# Patient Record
Sex: Female | Born: 1950
Health system: Southern US, Community
[De-identification: ages and names within clinical notes are randomized; demographics above are authoritative.]

## PROBLEM LIST (undated history)

## (undated) DIAGNOSIS — M48061 Spinal stenosis, lumbar region without neurogenic claudication: Secondary | ICD-10-CM

## (undated) DIAGNOSIS — T7840XA Allergy, unspecified, initial encounter: Secondary | ICD-10-CM

## (undated) DIAGNOSIS — E559 Vitamin D deficiency, unspecified: Secondary | ICD-10-CM

## (undated) DIAGNOSIS — M5430 Sciatica, unspecified side: Secondary | ICD-10-CM

## (undated) DIAGNOSIS — M6281 Muscle weakness (generalized): Secondary | ICD-10-CM

## (undated) DIAGNOSIS — M810 Age-related osteoporosis without current pathological fracture: Secondary | ICD-10-CM

## (undated) DIAGNOSIS — IMO0001 Reserved for inherently not codable concepts without codable children: Secondary | ICD-10-CM

## (undated) DIAGNOSIS — M1990 Unspecified osteoarthritis, unspecified site: Secondary | ICD-10-CM

## (undated) DIAGNOSIS — F329 Major depressive disorder, single episode, unspecified: Secondary | ICD-10-CM

## (undated) DIAGNOSIS — R51 Headache: Secondary | ICD-10-CM

## (undated) DIAGNOSIS — M7071 Other bursitis of hip, right hip: Secondary | ICD-10-CM

## (undated) DIAGNOSIS — G8929 Other chronic pain: Secondary | ICD-10-CM

## (undated) DIAGNOSIS — M76899 Other specified enthesopathies of unspecified lower limb, excluding foot: Secondary | ICD-10-CM

## (undated) DIAGNOSIS — M797 Fibromyalgia: Secondary | ICD-10-CM

## (undated) DIAGNOSIS — F3289 Other specified depressive episodes: Secondary | ICD-10-CM

## (undated) DIAGNOSIS — R7989 Other specified abnormal findings of blood chemistry: Secondary | ICD-10-CM

## (undated) DIAGNOSIS — Z9289 Personal history of other medical treatment: Secondary | ICD-10-CM

## (undated) DIAGNOSIS — N179 Acute kidney failure, unspecified: Secondary | ICD-10-CM

## (undated) DIAGNOSIS — Z9109 Other allergy status, other than to drugs and biological substances: Secondary | ICD-10-CM

## (undated) DIAGNOSIS — I10 Essential (primary) hypertension: Secondary | ICD-10-CM

## (undated) DIAGNOSIS — R296 Repeated falls: Secondary | ICD-10-CM

## (undated) DIAGNOSIS — M419 Scoliosis, unspecified: Secondary | ICD-10-CM

## (undated) DIAGNOSIS — H269 Unspecified cataract: Secondary | ICD-10-CM

## (undated) DIAGNOSIS — M549 Dorsalgia, unspecified: Secondary | ICD-10-CM

## (undated) DIAGNOSIS — J45909 Unspecified asthma, uncomplicated: Secondary | ICD-10-CM

## (undated) DIAGNOSIS — I7300 Raynaud's syndrome without gangrene: Secondary | ICD-10-CM

## (undated) DIAGNOSIS — R519 Headache, unspecified: Secondary | ICD-10-CM

## (undated) DIAGNOSIS — M5432 Sciatica, left side: Secondary | ICD-10-CM

## (undated) DIAGNOSIS — K219 Gastro-esophageal reflux disease without esophagitis: Secondary | ICD-10-CM

## (undated) DIAGNOSIS — R5383 Other fatigue: Secondary | ICD-10-CM

## (undated) DIAGNOSIS — G43909 Migraine, unspecified, not intractable, without status migrainosus: Secondary | ICD-10-CM

## (undated) DIAGNOSIS — R5381 Other malaise: Secondary | ICD-10-CM

## (undated) DIAGNOSIS — M7072 Other bursitis of hip, left hip: Secondary | ICD-10-CM

## (undated) DIAGNOSIS — F419 Anxiety disorder, unspecified: Secondary | ICD-10-CM

## (undated) HISTORY — DX: Other specified abnormal findings of blood chemistry: R79.89

## (undated) HISTORY — DX: Age-related osteoporosis without current pathological fracture: M81.0

## (undated) HISTORY — PX: SHOULDER OPEN ROTATOR CUFF REPAIR: SHX2407

## (undated) HISTORY — DX: Gastro-esophageal reflux disease without esophagitis: K21.9

## (undated) HISTORY — DX: Other specified depressive episodes: F32.89

## (undated) HISTORY — DX: Reserved for inherently not codable concepts without codable children: IMO0001

## (undated) HISTORY — DX: Sciatica, unspecified side: M54.30

## (undated) HISTORY — DX: Major depressive disorder, single episode, unspecified: F32.9

## (undated) HISTORY — DX: Muscle weakness (generalized): M62.81

## (undated) HISTORY — DX: Unspecified cataract: H26.9

## (undated) HISTORY — DX: Other malaise: R53.81

## (undated) HISTORY — DX: Anxiety disorder, unspecified: F41.9

## (undated) HISTORY — DX: Allergy, unspecified, initial encounter: T78.40XA

## (undated) HISTORY — DX: Vitamin D deficiency, unspecified: E55.9

## (undated) HISTORY — DX: Raynaud's syndrome without gangrene: I73.00

## (undated) HISTORY — DX: Other malaise: R53.83

## (undated) HISTORY — DX: Other specified enthesopathies of unspecified lower limb, excluding foot: M76.899

## (undated) HISTORY — DX: Essential (primary) hypertension: I10

---

## 1983-07-01 DIAGNOSIS — Z9289 Personal history of other medical treatment: Secondary | ICD-10-CM

## 1983-07-01 HISTORY — DX: Personal history of other medical treatment: Z92.89

## 1983-07-01 HISTORY — PX: ABDOMINAL HYSTERECTOMY: SHX81

## 2012-01-07 ENCOUNTER — Other Ambulatory Visit (HOSPITAL_COMMUNITY): Payer: Self-pay | Admitting: Otolaryngology

## 2012-01-07 DIAGNOSIS — R1310 Dysphagia, unspecified: Secondary | ICD-10-CM

## 2012-01-07 DIAGNOSIS — J329 Chronic sinusitis, unspecified: Secondary | ICD-10-CM

## 2012-01-14 ENCOUNTER — Other Ambulatory Visit (HOSPITAL_COMMUNITY): Payer: Self-pay

## 2012-01-20 ENCOUNTER — Ambulatory Visit (HOSPITAL_COMMUNITY)
Admission: RE | Admit: 2012-01-20 | Discharge: 2012-01-20 | Disposition: A | Discharge status: Home / Self Care | Payer: Medicare Other | Source: Ambulatory Visit | Attending: Otolaryngology | Admitting: Otolaryngology

## 2012-01-20 DIAGNOSIS — J3489 Other specified disorders of nose and nasal sinuses: Secondary | ICD-10-CM | POA: Insufficient documentation

## 2012-01-20 DIAGNOSIS — R1310 Dysphagia, unspecified: Secondary | ICD-10-CM

## 2012-01-20 DIAGNOSIS — R0989 Other specified symptoms and signs involving the circulatory and respiratory systems: Secondary | ICD-10-CM | POA: Insufficient documentation

## 2012-01-20 DIAGNOSIS — J329 Chronic sinusitis, unspecified: Secondary | ICD-10-CM

## 2012-01-20 NOTE — Procedures (Signed)
Objective Swallowing Evaluation: Modified Barium Swallowing Study  Patient Details  Name: Melinda [name] MRN: [id number] Date of Birth: Nov 06, 1950  Today's Date: 01/20/2012 Time: 1145-1200 SLP Time Calculation (min): 15 min  Past Medical History: No past medical history on file. Past Surgical History: No past surgical history on file. HPI:  61 year old female with PMH of arthritis, HTN, sciatica, and fibromyalgia seen for OP MBS due to recent onset difficulty swallowing including globus and odynophagia.      Assessment / Plan / Recommendation Clinical Impression  Dysphagia Diagnosis: Suspected primary esophageal dysphagia Clinical impression: Patient presents with a suspected primary esophageal dysphagia characterized by c/o globus and painful swallow despite normal oropharyngeal swallowing function without evidence of aspiration, penetration, or pharyngeal residuals. Esophageal sweep did reveal what appeared to be slow clearance of solid bolus through the esophagus to this clinician however MD not present to confirm. Patient has barium swallow scheduled for after today's study. No f/u SLP needs indicated. Defer further w/u to MD.     Treatment Recommendation  No treatment recommended at this time    Diet Recommendation Regular;Thin liquid   Liquid Administration via: Cup;Straw Medication Administration: Whole meds with liquid Supervision: Patient able to self feed Compensations: Slow rate;Small sips/bites;Follow solids with liquid (room temp or warm liquids) Postural Changes and/or Swallow Maneuvers: Seated upright 90 degrees;Upright 30-60 min after meal    Other  Recommendations Recommended Consults: Consider GI evaluation;Consider esophageal assessment Oral Care Recommendations: Oral care BID   Follow Up Recommendations  None         General HPI: 61 year old female with PMH of arthritis, HTN, sciatica, and fibromyalgia seen for OP MBS due to recent onset difficulty swallowing  including globus and odynophagia.  Type of Study: Modified Barium Swallowing Study Reason for Referral: Objectively evaluate swallowing function Diet Prior to this Study: Regular;Thin liquids Temperature Spikes Noted: No Respiratory Status: Room air History of Recent Intubation: No Behavior/Cognition: Alert;Cooperative;Pleasant mood Oral Cavity - Dentition: Adequate natural dentition Oral Motor / Sensory Function: Within functional limits Self-Feeding Abilities: Able to feed self Patient Positioning: Upright in chair Baseline Vocal Quality: Clear Volitional Cough: Strong Volitional Swallow: Able to elicit Anatomy: Within functional limits Pharyngeal Secretions: Not observed secondary MBS    Reason for Referral Objectively evaluate swallowing function   Ferdinand Lango MA, CCC-SLP [phone]           Christinia Lambeth Meryl 01/20/2012, 1:53 PM

## 2012-08-05 ENCOUNTER — Encounter: Payer: Self-pay | Admitting: Gastroenterology

## 2012-08-05 ENCOUNTER — Other Ambulatory Visit: Payer: Self-pay | Admitting: Nurse Practitioner

## 2012-08-05 DIAGNOSIS — Z1231 Encounter for screening mammogram for malignant neoplasm of breast: Secondary | ICD-10-CM

## 2012-09-02 ENCOUNTER — Ambulatory Visit: Payer: Medicare Other

## 2012-09-07 ENCOUNTER — Encounter: Payer: Medicare Other | Admitting: Gastroenterology

## 2012-11-30 ENCOUNTER — Ambulatory Visit (INDEPENDENT_AMBULATORY_CARE_PROVIDER_SITE_OTHER): Payer: Medicare Other | Admitting: Internal Medicine

## 2012-11-30 ENCOUNTER — Encounter: Payer: Self-pay | Admitting: Internal Medicine

## 2012-11-30 VITALS — BP 112/84 | HR 71 | Temp 98.2°F | Resp 20 | Ht 64.0 in | Wt 245.0 lb

## 2012-11-30 DIAGNOSIS — IMO0001 Reserved for inherently not codable concepts without codable children: Secondary | ICD-10-CM

## 2012-11-30 DIAGNOSIS — I10 Essential (primary) hypertension: Secondary | ICD-10-CM

## 2012-11-30 DIAGNOSIS — M6281 Muscle weakness (generalized): Secondary | ICD-10-CM

## 2012-11-30 MED ORDER — LORATADINE 10 MG PO TABS: 10.0000 mg | ORAL_TABLET | Freq: Every day | ORAL | Status: DC | PRN

## 2012-11-30 MED ORDER — OXYCODONE-ACETAMINOPHEN 5-325 MG PO TABS: 1.0000 | ORAL_TABLET | Freq: Four times a day (QID) | ORAL | Status: DC | PRN

## 2012-11-30 NOTE — Patient Instructions (Signed)
You will be getting your MRI spine as scheduled  Please take your pain medication as prescribed  If you notice problem controlling your urination or bowel habit or have any numbness in your extremities, please notify us immediately  I will notify you about the result of your MRI

## 2012-11-30 NOTE — Progress Notes (Signed)
Subjective:    Patient ID: Melinda [name], female    DOB: 1950-07-25, 62 y.o.   MRN: [id number]  CC- worsening left sided weakness  HPI  62 y/o female patient is here with complaints of worsening left sided weakness. She has pain in left arm, hand and left leg. The pain is 7-8/10, radiates from her buttocks to her thigh and leg and also to the groin area. She feels she is dragging her left leg. Also feels losing her strength in her left hand thus making it difficult to use her rollator walker. Denies any numbness or tingling.  She has been having weakness on her left side since jan 2013. Prior to this she has been having pain on her left hip and leg for some time and was seen by a specialist (does not remember what specialist), had imagings and was informed it was bursitis. But since the weakness started in jan 2013 she has not seen anyone and her weakness has worsened. She has a rollator walker since dec 2012 after a fall due to balance issue. Denies any recent fall  She has history of sciatica There is no record from her previous PCP office for review. She provides hx of fibromyalgia and is on gabapentin Denies any bowel/ bladder incontinence Has increased urinary frequency She has completed her therapy sessions in march 2014. Her allergy has been acting up. She uses loratadine prn for this and would like a script for this  Review of Systems  Constitutional: Negative for fever, chills, diaphoresis, appetite change and fatigue.  HENT: Positive for neck pain. Negative for hearing loss, ear pain, rhinorrhea, mouth sores and neck stiffness.   Respiratory: Negative for cough and shortness of breath.   Cardiovascular: Negative for chest pain, palpitations and leg swelling.  Gastrointestinal: Negative for nausea, vomiting, abdominal pain, diarrhea, constipation, blood in stool and abdominal distention.  Genitourinary: Negative for dysuria, flank pain and difficulty urinating.  Musculoskeletal:  Positive for myalgias, back pain, arthralgias and gait problem. Negative for joint swelling.  Skin: Negative for color change, pallor and rash.  Neurological: Positive for weakness. Negative for dizziness, tremors, syncope, speech difficulty, light-headedness and headaches.  Hematological: Negative for adenopathy.  Psychiatric/Behavioral: Negative for confusion, sleep disturbance and agitation. The patient is not nervous/anxious.       Objective:   Physical Exam  Constitutional: She is oriented to person, place, and time.  Obese, in NAD  HENT:  Head: Normocephalic and atraumatic.  Mouth/Throat: Oropharynx is clear and moist.  Eyes: Conjunctivae and EOM are normal. Pupils are equal, round, and reactive to light.  Neck: Normal range of motion. Neck supple. No JVD present. No tracheal deviation present.  Cardiovascular: Normal rate and regular rhythm.   Pulmonary/Chest: Effort normal and breath sounds normal.  Abdominal: Soft. Bowel sounds are normal.  Musculoskeletal:  Has cervical spine tenderness, no paraspinal tenderness Has lumbar spine tenderness will mild left paravertebral tenderness No stepping sign Extension at neck area illicits pain ROM in left hip area limited with pain Strength in left upper extremity 4/5 and 5/5 in RUE Strength in LLE 4/5 and 5/5 in RLE Normal muscle tone Normal reflexes Normal sensation including pinprick and vibration  Lymphadenopathy:    She has no cervical adenopathy.  Neurological: She is alert and oriented to person, place, and time. No cranial nerve deficit.  Skin: Skin is warm and dry. She is not diaphoretic.  Psychiatric: She has a normal mood and affect. Her behavior is normal.  BP 112/84  Pulse 71  Temp(Src) 98.2 F (36.8 C) (Oral)  Resp 20  Ht 5\' 4"  (1.626 m)  Wt 245 lb (111.131 kg)  BMI 42.03 kg/m2  SpO2 99%     Assessment & Plan:   Left sided weakness- acute on chronic with worsening, strength decreased on exam,  neurologically intact. Concern for spinal stenosis vs disc bulging causing impingement vs muscular disorders (myositis vs myopathies). Will get mri cervical,lumbar and thoracic spine to assess for stenosis vs disc protrusion. Will check CK and ESR to rule out inflammation. Will have her on oxycodone -apap 5/325 1-2 tab q6h prn for pain. Warning signs explained. To follow with her pcp in 2 weeks but earlier if symptoms worsen.  Gait instability- has dragging and cautious gait. Given her weakness and gait instability, will have PT referral to assess for gait unsteadiness, muscle strengthening exercise and need for a wheelchair in home environment. Fall precautions  HTN- bp well controlled. Continue current regimen

## 2012-12-01 LAB — CBC WITH DIFFERENTIAL/PLATELET
Basophils Absolute: 0.1 10*3/uL (ref 0.0–0.2)
Basos: 1 % (ref 0–3)
Eos: 7 % — ABNORMAL HIGH (ref 0–5)
Eosinophils Absolute: 0.4 10*3/uL (ref 0.0–0.4)
HCT: 41.5 % (ref 34.0–46.6)
Hemoglobin: 13.7 g/dL (ref 11.1–15.9)
Immature Grans (Abs): 0 10*3/uL (ref 0.0–0.1)
Immature Granulocytes: 0 % (ref 0–2)
Lymphocytes Absolute: 2.1 10*3/uL (ref 0.7–3.1)
Lymphs: 37 % (ref 14–46)
MCH: 27.5 pg (ref 26.6–33.0)
MCHC: 33 g/dL (ref 31.5–35.7)
MCV: 83 fL (ref 79–97)
Monocytes Absolute: 0.5 10*3/uL (ref 0.1–0.9)
Monocytes: 9 % (ref 4–12)
Neutrophils Absolute: 2.7 10*3/uL (ref 1.4–7.0)
Neutrophils Relative %: 46 % (ref 40–74)
RBC: 4.98 x10E6/uL (ref 3.77–5.28)
RDW: 13.9 % (ref 12.3–15.4)
WBC: 5.8 10*3/uL (ref 3.4–10.8)

## 2012-12-01 LAB — COMPREHENSIVE METABOLIC PANEL
ALT: 6 IU/L (ref 0–32)
AST: 10 IU/L (ref 0–40)
Albumin/Globulin Ratio: 1.6 (ref 1.1–2.5)
Albumin: 4.2 g/dL (ref 3.6–4.8)
Alkaline Phosphatase: 77 IU/L (ref 39–117)
BUN/Creatinine Ratio: 10 — ABNORMAL LOW (ref 11–26)
BUN: 9 mg/dL (ref 8–27)
CO2: 24 mmol/L (ref 19–28)
Calcium: 9.2 mg/dL (ref 8.6–10.2)
Chloride: 100 mmol/L (ref 97–108)
Creatinine, Ser: 0.88 mg/dL (ref 0.57–1.00)
GFR calc Af Amer: 82 mL/min/{1.73_m2} (ref >59)
GFR calc non Af Amer: 71 mL/min/{1.73_m2} (ref >59)
Globulin, Total: 2.7 g/dL (ref 1.5–4.5)
Glucose: 91 mg/dL (ref 65–99)
Potassium: 4.7 mmol/L (ref 3.5–5.2)
Sodium: 142 mmol/L (ref 134–144)
Total Bilirubin: 0.5 mg/dL (ref 0.0–1.2)
Total Protein: 6.9 g/dL (ref 6.0–8.5)

## 2012-12-01 LAB — CK: Total CK: 35 U/L (ref 24–173)

## 2012-12-01 LAB — SEDIMENTATION RATE: Sed Rate: 18 mm/hr (ref 0–40)

## 2012-12-06 ENCOUNTER — Ambulatory Visit: Payer: Self-pay | Admitting: Nurse Practitioner

## 2012-12-07 ENCOUNTER — Ambulatory Visit: Payer: Self-pay | Admitting: Nurse Practitioner

## 2012-12-09 ENCOUNTER — Ambulatory Visit
Admission: RE | Admit: 2012-12-09 | Discharge: 2012-12-09 | Disposition: A | Discharge status: Home / Self Care | Payer: Medicare Other | Source: Ambulatory Visit | Attending: Internal Medicine | Admitting: Internal Medicine

## 2012-12-09 DIAGNOSIS — M6281 Muscle weakness (generalized): Secondary | ICD-10-CM

## 2012-12-14 ENCOUNTER — Ambulatory Visit: Payer: Medicare Other | Admitting: Nurse Practitioner

## 2012-12-21 ENCOUNTER — Other Ambulatory Visit: Payer: Medicare Other

## 2012-12-27 ENCOUNTER — Encounter: Payer: Self-pay | Admitting: *Deleted

## 2012-12-28 ENCOUNTER — Ambulatory Visit: Payer: Self-pay | Admitting: Nurse Practitioner

## 2012-12-28 DIAGNOSIS — Z0289 Encounter for other administrative examinations: Secondary | ICD-10-CM

## 2012-12-29 ENCOUNTER — Ambulatory Visit: Admission: RE | Admit: 2012-12-29 | Payer: Medicare Other | Source: Ambulatory Visit

## 2012-12-29 ENCOUNTER — Other Ambulatory Visit: Payer: Self-pay | Admitting: *Deleted

## 2013-01-02 ENCOUNTER — Other Ambulatory Visit: Payer: Self-pay | Admitting: Nurse Practitioner

## 2013-01-03 ENCOUNTER — Other Ambulatory Visit: Payer: Self-pay | Admitting: Geriatric Medicine

## 2013-01-03 MED ORDER — METOPROLOL TARTRATE 50 MG PO TABS: 50.0000 mg | ORAL_TABLET | Freq: Two times a day (BID) | ORAL | Status: DC

## 2013-01-03 MED ORDER — HYDROCHLOROTHIAZIDE 25 MG PO TABS: 25.0000 mg | ORAL_TABLET | Freq: Every day | ORAL | Status: DC

## 2013-01-03 MED ORDER — AMLODIPINE BESYLATE 5 MG PO TABS: 5.0000 mg | ORAL_TABLET | Freq: Every day | ORAL | Status: DC

## 2013-01-03 MED ORDER — GABAPENTIN 400 MG PO CAPS: 400.0000 mg | ORAL_CAPSULE | Freq: Three times a day (TID) | ORAL | Status: DC

## 2013-01-03 MED ORDER — LISINOPRIL 2.5 MG PO TABS: 2.5000 mg | ORAL_TABLET | Freq: Every day | ORAL | Status: DC

## 2013-01-04 ENCOUNTER — Other Ambulatory Visit: Payer: Self-pay | Admitting: Nurse Practitioner

## 2013-01-12 ENCOUNTER — Ambulatory Visit (INDEPENDENT_AMBULATORY_CARE_PROVIDER_SITE_OTHER): Payer: Medicare Other | Admitting: Nurse Practitioner

## 2013-01-12 VITALS — BP 124/80 | HR 122 | Temp 97.5°F | Resp 16 | Ht 64.0 in | Wt 240.4 lb

## 2013-01-12 DIAGNOSIS — S5000XA Contusion of unspecified elbow, initial encounter: Secondary | ICD-10-CM

## 2013-01-12 DIAGNOSIS — IMO0001 Reserved for inherently not codable concepts without codable children: Secondary | ICD-10-CM

## 2013-01-12 DIAGNOSIS — S5002XA Contusion of left elbow, initial encounter: Secondary | ICD-10-CM

## 2013-01-12 DIAGNOSIS — R21 Rash and other nonspecific skin eruption: Secondary | ICD-10-CM

## 2013-01-12 MED ORDER — HYDROXYZINE HCL 25 MG PO TABS: 25.0000 mg | ORAL_TABLET | Freq: Three times a day (TID) | ORAL | Status: DC | PRN

## 2013-01-12 NOTE — Progress Notes (Signed)
Patient ID: Melinda [name], female   DOB: Jan 14, 1951, 62 y.o.   MRN: [id number]   No Known Allergies  Chief Complaint  Patient presents with  . Rash    HPI: Patient is a 62 y.o. female seen in the office today for rash and itching all over. Has been going on for 2 weeks. Was trying not to scratch but now she is scratching and causing marks so she decided to be seen. Reports overall the rash is getting better.  No fevers or chills.  Starting taking oxycodone in June but rash did not appear July. otherwise new medication. Has changed body wash and noticed her skin was worse after she started using that. She also quit using for a few days and her skin improved but she tried it again this morning and noticed it got worse after that.   Had a fall when coming into the building today- hit below elbow on left side now with small abrasion; reports minimal pain at site area cleaned Review of Systems:  Review of Systems  Constitutional: Negative for fever, chills and malaise/fatigue.  Respiratory: Negative for cough, shortness of breath and wheezing.   Cardiovascular: Negative for chest pain.  Musculoskeletal: Positive for myalgias and joint pain.       Chronic- to get MRI- was awaiting prior auth- now scheduled  Skin: Positive for itching and rash.       Rash to bilateral forearms and neck (overall neck and chest area has improved)  Neurological: Negative for weakness and headaches.     Past Medical History  Diagnosis Date  . Myalgia and myositis, unspecified   . Unspecified vitamin D deficiency   . Depressive disorder, not elsewhere classified   . Essential hypertension, benign   . Raynaud's syndrome   . Sciatica   . Enthesopathy of hip region   . Muscle weakness (generalized)   . Other malaise and fatigue   . Other abnormal blood chemistry   . Personal history of arthritis   . Personal history of fall    Past Surgical History  Procedure Laterality Date  . Abdominal hysterectomy   1983   Social History:   reports that she has never smoked. She does not have any smokeless tobacco history on file. She reports that she does not drink alcohol or use illicit drugs.  Family History  Problem Relation Age of Onset  . Dementia Mother   . Heart disease Mother   . Hypertension Mother   . Diabetes Mother   . Diabetes Sister   . Cancer Sister     lung    Medications: Patient's Medications  New Prescriptions   No medications on file  Previous Medications   AMITRIPTYLINE (ELAVIL) 25 MG TABLET    TAKE ONE TABLET BY MOUTH EVERY DAY AT BEDTIME   AMLODIPINE (NORVASC) 5 MG TABLET    Take 1 tablet (5 mg total) by mouth daily.   AMLODIPINE (NORVASC) 5 MG TABLET       ASPIRIN 81 MG TABLET    Take 81 mg by mouth daily.   FLUTICASONE (FLONASE) 50 MCG/ACT NASAL SPRAY    Place 2 sprays into the nose daily. Two sprays into each nostril twice a day.   GABAPENTIN (NEURONTIN) 400 MG CAPSULE    Take 1 capsule (400 mg total) by mouth 3 (three) times daily.   HYDROCHLOROTHIAZIDE (HYDRODIURIL) 25 MG TABLET    Take 1 tablet (25 mg total) by mouth daily.   LISINOPRIL (PRINIVIL,ZESTRIL) 2.5 MG TABLET  Take 1 tablet (2.5 mg total) by mouth daily.   LORATADINE (CLARITIN) 10 MG TABLET    Take 1 tablet (10 mg total) by mouth daily as needed for allergies.   METOPROLOL (LOPRESSOR) 50 MG TABLET    Take 1 tablet (50 mg total) by mouth 2 (two) times daily.   OXYCODONE-ACETAMINOPHEN (ROXICET) 5-325 MG PER TABLET    Take 1 tablet by mouth every 6 (six) hours as needed for pain. Take 1 tablet every 6 hour as needed for mild to moderate pain and 2 tablet every 6 hour as needed for severe pain   VITAMIN D, CHOLECALCIFEROL, 400 UNITS TABLET    Take by mouth daily. Take three  Capsules by mouth to equal 1200mg  daily.  Modified Medications   No medications on file  Discontinued Medications   No medications on file     Physical Exam:  Filed Vitals:   01/12/13 1305  BP: 124/80  Pulse: 122  Temp: 97.5  F (36.4 C)  TempSrc: Oral  Resp: 16  Height: 5\' 4"  (1.626 m)  Weight: 240 lb 6.4 oz (109.045 kg)  SpO2: 95%    Physical Exam  Constitutional: She is well-developed, well-nourished, and in no distress. No distress.  HENT:  Head: Normocephalic and atraumatic.  Neck: Normal range of motion. Neck supple.  Cardiovascular: Normal rate, regular rhythm and normal heart sounds.   Pulmonary/Chest: Effort normal and breath sounds normal. No respiratory distress.  Abdominal: Soft. Bowel sounds are normal.  Musculoskeletal: Normal range of motion. She exhibits tenderness (mild tenderness around abrasion from fall on left elbow). She exhibits no edema.  Neurological: She is alert.  Skin: Skin is warm and dry. Rash (raised bumps on left forearm. right forearm with excoriation- no rash noted on chest or neck) noted. She is not diaphoretic.     Labs reviewed: Basic Metabolic Panel:  Recent Labs  16/10/96 1058  NA 142  K 4.7  CL 100  CO2 24  GLUCOSE 91  BUN 9  CREATININE 0.88  CALCIUM 9.2   Liver Function Tests:  Recent Labs  11/30/12 1058  AST 10  ALT 6  ALKPHOS 77  BILITOT 0.5  PROT 6.9   No results found for this basename: LIPASE, AMYLASE,  in the last 8760 hours No results found for this basename: AMMONIA,  in the last 8760 hours CBC:  Recent Labs  11/30/12 1058  WBC 5.8  NEUTROABS 2.7  HGB 13.7  HCT 41.5  MCV 83      Assessment/Plan  1.   Myalgia and myositis, unspecified 729.1   - awaiting MRI results   2.   Contusion, elbow, left, initial encounter 923.11     Status post fall- able to move elbow without difficulty reports minimal pain with movement and touch. Educated to ice for 20 mins twice daily for the next 2 days and to call if worsening pain, swelling or redness occurs   3.   Rash and nonspecific skin eruption - stop use of new body wash, to use hydrocodone 1% to effected area twice daily, may use aveeno oatmeal bath and vistaril 25 mg every 8 hours  as needed for itch. To follow up if rash does not improve or gets worse    Labs/tests ordered

## 2013-01-12 NOTE — Patient Instructions (Addendum)
Stop using new body wash due to rash and itching May use Aveeno oatmeal bath to help itching Cont hydrocortisone 1% twice daily May use hydroxyzine for itch ( up to 3 times a day as needed) prescription sent to pharmacy     Contact Dermatitis Contact dermatitis is a reaction to certain substances that touch the skin. Contact dermatitis can be either irritant contact dermatitis or allergic contact dermatitis. Irritant contact dermatitis does not require previous exposure to the substance for a reaction to occur.Allergic contact dermatitis only occurs if you have been exposed to the substance before. Upon a repeat exposure, your body reacts to the substance.  CAUSES  Many substances can cause contact dermatitis. Irritant dermatitis is most commonly caused by repeated exposure to mildly irritating substances, such as:  Makeup.  Soaps.  Detergents.  Bleaches.  Acids.  Metal salts, such as nickel. Allergic contact dermatitis is most commonly caused by exposure to:  Poisonous plants.  Chemicals (deodorants, shampoos).  Jewelry.  Latex.  Neomycin in triple antibiotic cream.  Preservatives in products, including clothing. SYMPTOMS  The area of skin that is exposed may develop:  Dryness or flaking.  Redness.  Cracks.  Itching.  Pain or a burning sensation.  Blisters. With allergic contact dermatitis, there may also be swelling in areas such as the eyelids, mouth, or genitals.  DIAGNOSIS  Your caregiver can usually tell what the problem is by doing a physical exam. In cases where the cause is uncertain and an allergic contact dermatitis is suspected, a patch skin test may be performed to help determine the cause of your dermatitis. TREATMENT Treatment includes protecting the skin from further contact with the irritating substance by avoiding that substance if possible. Barrier creams, powders, and gloves may be helpful. Your caregiver may also recommend:  Steroid creams  or ointments applied 2 times daily. For best results, soak the rash area in cool water for 20 minutes. Then apply the medicine. Cover the area with a plastic wrap. You can store the steroid cream in the refrigerator for a "chilly" effect on your rash. That may decrease itching. Oral steroid medicines may be needed in more severe cases.  Antibiotics or antibacterial ointments if a skin infection is present.  Antihistamine lotion or an antihistamine taken by mouth to ease itching.  Lubricants to keep moisture in your skin.  Burow's solution to reduce redness and soreness or to dry a weeping rash. Mix one packet or tablet of solution in 2 cups cool water. Dip a clean washcloth in the mixture, wring it out a bit, and put it on the affected area. Leave the cloth in place for 30 minutes. Do this as often as possible throughout the day.  Taking several cornstarch or baking soda baths daily if the area is too large to cover with a washcloth. Harsh chemicals, such as alkalis or acids, can cause skin damage that is like a burn. You should flush your skin for 15 to 20 minutes with cold water after such an exposure. You should also seek immediate medical care after exposure. Bandages (dressings), antibiotics, and pain medicine may be needed for severely irritated skin.  HOME CARE INSTRUCTIONS  Avoid the substance that caused your reaction.  Keep the area of skin that is affected away from hot water, soap, sunlight, chemicals, acidic substances, or anything else that would irritate your skin.  Do not scratch the rash. Scratching may cause the rash to become infected.  You may take cool baths to help  stop the itching.  Only take over-the-counter or prescription medicines as directed by your caregiver.  See your caregiver for follow-up care as directed to make sure your skin is healing properly. SEEK MEDICAL CARE IF:   Your condition is not better after 3 days of treatment.  You seem to be getting  worse.  You see signs of infection such as swelling, tenderness, redness, soreness, or warmth in the affected area.  You have any problems related to your medicines. Document Released: 06/13/2000 Document Revised: 09/08/2011 Document Reviewed: 11/19/2010 [facility] Patient Information 2014 [place], [place].

## 2013-01-15 ENCOUNTER — Other Ambulatory Visit: Payer: Medicare Other

## 2013-01-20 ENCOUNTER — Ambulatory Visit
Admission: RE | Admit: 2013-01-20 | Discharge: 2013-01-20 | Disposition: A | Discharge status: Home / Self Care | Payer: Medicare Other | Source: Ambulatory Visit | Attending: Internal Medicine | Admitting: Internal Medicine

## 2013-01-24 ENCOUNTER — Telehealth: Payer: Self-pay | Admitting: Geriatric Medicine

## 2013-01-24 NOTE — Telephone Encounter (Signed)
Please review her MRI when you get a chance. She has been calling the office. I explained that you would get to is as soon as you could. She asked me to just send you a message to ask you to review it.

## 2013-01-25 ENCOUNTER — Telehealth: Payer: Self-pay | Admitting: Internal Medicine

## 2013-01-25 ENCOUNTER — Other Ambulatory Visit: Payer: Self-pay | Admitting: Internal Medicine

## 2013-01-25 DIAGNOSIS — M4802 Spinal stenosis, cervical region: Secondary | ICD-10-CM

## 2013-01-25 NOTE — Telephone Encounter (Signed)
Called patient and reviewed her mri spine result. Explained about severe narrowing of neck spine which could be causing her to have the pain. She also has narrowing with degenerative changes on her lumbar spine area. Her pain medication has been helpful at present. Will provide refills and referral to orthopedics

## 2013-01-25 NOTE — Telephone Encounter (Signed)
Talked with the patient. Please provide refills on her oxycodone for now. She will need urgent orthopedic referral. i have put in referral order in computer. pls make sure one is scheduled

## 2013-01-26 NOTE — Telephone Encounter (Signed)
Refills were provided. Referral is in the workqueue to be reviewed.

## 2013-01-27 ENCOUNTER — Ambulatory Visit (INDEPENDENT_AMBULATORY_CARE_PROVIDER_SITE_OTHER): Payer: Medicare Other | Admitting: Nurse Practitioner

## 2013-01-27 ENCOUNTER — Encounter: Payer: Self-pay | Admitting: Nurse Practitioner

## 2013-01-27 VITALS — BP 132/84 | HR 73 | Temp 98.3°F | Resp 14 | Ht 64.0 in | Wt 245.2 lb

## 2013-01-27 DIAGNOSIS — M4800 Spinal stenosis, site unspecified: Secondary | ICD-10-CM

## 2013-01-27 MED ORDER — OXYCODONE-ACETAMINOPHEN 5-325 MG PO TABS: ORAL_TABLET | ORAL | Status: DC

## 2013-01-27 NOTE — Progress Notes (Signed)
Patient ID: Melinda [name], female   DOB: [birth year], 62 y.o.   MRN: [id number]   No Known Allergies  Chief Complaint  Patient presents with  . Medical Managment of Chronic Issues    HPI: Patient is a 62 y.o. female seen in the office today for follow up on MRI results.  Has upper and lower back pain which goes into hips and when she walks her groin hurts Reports oxycodone in combination with gabapentin helps but she is still dragging her left leg.  At last visit she fell; has not had another fall since however she is prone to falls.  Has numbness and tingling depending on her position Weakness in hands can no grip walker and it gets away from her.  Worked with PT which helped but now due to increased pain she is worse.  No incont of bowel or bladder Would like a referral to ortho at this time Review of Systems:  Review of Systems  Constitutional: Negative for weight loss.  HENT: Positive for neck pain.   Respiratory: Negative for shortness of breath.   Cardiovascular: Negative for chest pain and palpitations.  Gastrointestinal: Negative for abdominal pain, diarrhea and constipation.  Genitourinary: Negative for dysuria, urgency and frequency.  Musculoskeletal: Positive for myalgias, back pain, joint pain and falls.  Skin: Negative for rash (rash has resolved from previous visit).  Neurological: Positive for tingling and weakness. Negative for dizziness.     Past Medical History  Diagnosis Date  . Myalgia and myositis, unspecified   . Unspecified vitamin D deficiency   . Depressive disorder, not elsewhere classified   . Essential hypertension, benign   . Raynaud's syndrome   . Sciatica   . Enthesopathy of hip region   . Muscle weakness (generalized)   . Other malaise and fatigue   . Other abnormal blood chemistry   . Personal history of arthritis   . Personal history of fall    Past Surgical History  Procedure Laterality Date  . Abdominal hysterectomy  1983   Social  History:   reports that she has never smoked. She does not have any smokeless tobacco history on file. She reports that she does not drink alcohol or use illicit drugs.  Family History  Problem Relation Age of Onset  . Dementia Mother   . Heart disease Mother   . Hypertension Mother   . Diabetes Mother   . Diabetes Sister   . Cancer Sister     lung    Medications: Patient's Medications  New Prescriptions   No medications on file  Previous Medications   AMITRIPTYLINE (ELAVIL) 25 MG TABLET    TAKE ONE TABLET BY MOUTH EVERY DAY AT BEDTIME   AMLODIPINE (NORVASC) 5 MG TABLET    Take 1 tablet (5 mg total) by mouth daily.   ASPIRIN 81 MG TABLET    Take 81 mg by mouth daily.   FLUTICASONE (FLONASE) 50 MCG/ACT NASAL SPRAY    Place 2 sprays into the nose daily. Two sprays into each nostril twice a day.   GABAPENTIN (NEURONTIN) 400 MG CAPSULE    Take 1 capsule (400 mg total) by mouth 3 (three) times daily.   HYDROCHLOROTHIAZIDE (HYDRODIURIL) 25 MG TABLET    Take 1 tablet (25 mg total) by mouth daily.   HYDROXYZINE (ATARAX/VISTARIL) 25 MG TABLET    Take 1 tablet (25 mg total) by mouth every 8 (eight) hours as needed for itching.   LISINOPRIL (PRINIVIL,ZESTRIL) 2.5 MG TABLET  Take 1 tablet (2.5 mg total) by mouth daily.   LORATADINE (CLARITIN) 10 MG TABLET    Take 1 tablet (10 mg total) by mouth daily as needed for allergies.   METOPROLOL (LOPRESSOR) 50 MG TABLET    Take 1 tablet (50 mg total) by mouth 2 (two) times daily.   OXYCODONE-ACETAMINOPHEN (ROXICET) 5-325 MG PER TABLET    Take 1 tablet by mouth every 6 (six) hours as needed for pain. Take 1 tablet every 6 hour as needed for mild to moderate pain and 2 tablet every 6 hour as needed for severe pain   VITAMIN D, CHOLECALCIFEROL, 400 UNITS TABLET    Take by mouth daily. Take three  Capsules by mouth to equal 1200mg  daily.  Modified Medications   No medications on file  Discontinued Medications   No medications on file     Physical  Exam:  Filed Vitals:   01/27/13 1013  BP: 132/84  Pulse: 73  Temp: 98.3 F (36.8 C)  TempSrc: Oral  Resp: 14  Height: 5\' 4"  (1.626 m)  Weight: 245 lb 3.2 oz (111.222 kg)    Physical Exam  Vitals reviewed. Constitutional: She is oriented to person, place, and time and well-developed, well-nourished, and in no distress. No distress.  HENT:  Head: Normocephalic and atraumatic.  Neck: Normal range of motion. Neck supple. No tracheal deviation present. No thyromegaly present.  Cardiovascular: Normal rate, regular rhythm and normal heart sounds.   Pulmonary/Chest: Effort normal and breath sounds normal. No respiratory distress.  Abdominal: Soft. Bowel sounds are normal. She exhibits no distension. There is no tenderness.  Musculoskeletal:  Tender spine from cervical to lumbar, reports tenderness on left side of spine in the lumbar region. Left sided weakness to upper (4/5) and lower extremities (4/5)  Lymphadenopathy:    She has no cervical adenopathy.  Neurological: She is alert and oriented to person, place, and time. She displays weakness. Gait (cautious gait- walks with rolling walker) abnormal.  Skin: Skin is warm and dry. No rash noted. She is not diaphoretic.    Labs reviewed: Basic Metabolic Panel:  Recent Labs  16/10/96 1058  NA 142  K 4.7  CL 100  CO2 24  GLUCOSE 91  BUN 9  CREATININE 0.88  CALCIUM 9.2   Liver Function Tests:  Recent Labs  11/30/12 1058  AST 10  ALT 6  ALKPHOS 77  BILITOT 0.5  PROT 6.9   No results found for this basename: LIPASE, AMYLASE,  in the last 8760 hours No results found for this basename: AMMONIA,  in the last 8760 hours CBC:  Recent Labs  11/30/12 1058  WBC 5.8  NEUTROABS 2.7  HGB 13.7  HCT 41.5  MCV 83  Imaging: MRI CERVICAL SPINE WITHOUT CONTRAST  Technique: Multiplanar and multiecho pulse sequences of the cervic  al spine, to include the craniocervical junction and cervicothoraci  c junction, were obtained  according to standard protocol without  intravenous contrast.  Findings: Partially empty sella configuration partially visible  with a congenitally deep bony sella turcica (series 5 image 6).  Otherwise grossly negative visualized brain parenchyma.  Cervicomedullary junction is within normal limits. Spinal cord  signal is within normal limits at all visualized levels.  Normal cervical vertebral height and alignment. No marrow edema or  evidence of acute osseous abnormality. Visualized paraspinal soft  tissues are within normal limits. Incidental T1 vertebral body  benign hemangioma.  C2-C3: Negative.  C3-C4: Negative.  C4-C5: Negative.  C5-C6: Mild  to moderate facet hypertrophy on the left. Mild left  uncovertebral hypertrophy. Negative disc. No spinal stenosis.  Severe left C6 foraminal stenosis.  C6-C7: Mild bilateral facet hypertrophy. Otherwise negative.  C7-T1: Negative.  IMPRESSION:  1. Very mild for age cervical spine degenerative changes. There  is multifactorial severe left C6 foraminal stenosis related to  facet and uncovertebral hypertrophy.  2. Partially empty sella configuration with congenitally deep  sella turcica. This can be a normal anatomic variant, but also can  be associated with idiopathic intracranial hypertension  (pseudotumor cerebri).  3. Thoracic and lumbar findings are below.  MRI THORACIC SPINE WITHOUT CONTRAST  Technique: Multiplanar and multiecho pulse sequences of the  thoracic spine were obtained without intravenous contrast.  Findings: Mildly exaggerated thoracic kyphosis. Otherwise normal  thoracic vertebral height and alignment. Incidental T1 benign  vertebral body hemangioma. Normal bone marrow signal. No marrow  edema or evidence of acute osseous abnormality.  Spinal cord signal is within normal limits at all visualized  levels. Conus medullaris at T12-L1.  Visualized paraspinal soft tissues are within normal limits.  Negative visualized  thoracic and upper abdominal viscera.  Capacious thoracic spinal canal. No thoracic spinal stenosis.  Thoracic intervertebral discs signal and morphology is within  normal limits for age. No thoracic disc herniation. Intermittent  mild thoracic facet hypertrophy (e.g. T7-T8 greater on the left).  No thoracic neural foraminal stenosis.  IMPRESSION:  1. Normal for age thoracic MRI.  2. Lumbar spine findings are below.  MRI LUMBAR SPINE WITHOUT CONTRAST  Technique: Multiplanar and multiecho pulse sequences of the lumbar  spine were obtained without intravenous contrast.  Findings: Normal lumbar segmentation. Lumbar vertebral height and  alignment within normal limits. No marrow edema or evidence of  acute osseous abnormality.  Large body habitus is evident in the lumbar spine. Visualized  paraspinal soft tissues are within normal limits. Negative  visualized abdominal viscera.  Visualized lower thoracic spinal cord is normal with conus  medularis at T12-L1.  T12-L1: Negative.  L1-L2: Negative disc. Moderate facet hypertrophy greater on the  right. No stenosis.  L2-L3: Mild congenital spinal canal narrowing related to short  pedicles. Minimal to mild circumferential disc bulge. Moderate  facet and ligament flavum hypertrophy. No lateral recess or  foraminal stenosis.  L3-L4: Mild congenital spinal canal narrowing related to short  pedicles. Mild circumferential disc bulge. Moderate facet  hypertrophy. Mild epidural lipomatosis. No lateral recess or  foraminal stenosis.  L4-L5: Less pronounced congenital spinal canal narrowing at this  level. Negative disc. Moderate to severe facet and ligament  flavum hypertrophy. Mild epidural lipomatosis. Overall no  significant stenosis.  L5-S1: Negative disc. Mild facet hypertrophy. No stenosis.  Negative visualized sacrum.  IMPRESSION:  1. Mild congenital spinal stenosis L2-L3 and L3-L4 related to  short pedicles. Superimposed mild disc and  moderate to severe  facet degeneration, but no convincing neural impingement.  2. Facet arthropathy throughout the lumbar spine and most  pronounced at L4-L5.   Assessment/Plan  1. Spinal stenosis Unchanged- Referral in process to ortho- will refill oxycodone at this time. - oxyCODONE-acetaminophen (ROXICET) 5-325 MG per tablet; Take 1 tablet every 6 hour as needed for mild to moderate pain and 2 tablet every 6 hour as needed for severe pain  Dispense: 240 tablet; Refill: 0

## 2013-01-27 NOTE — Patient Instructions (Signed)
Orthopedic referral is being done Cont pain medication as needed To follow up in 3 months for routine follow or as needed before

## 2013-04-01 ENCOUNTER — Other Ambulatory Visit: Payer: Self-pay | Admitting: Internal Medicine

## 2013-04-28 ENCOUNTER — Ambulatory Visit: Payer: Medicare Other | Admitting: Nurse Practitioner

## 2013-05-17 ENCOUNTER — Other Ambulatory Visit: Payer: Self-pay | Admitting: Internal Medicine

## 2013-06-08 ENCOUNTER — Other Ambulatory Visit: Payer: Self-pay | Admitting: Nurse Practitioner

## 2013-06-14 ENCOUNTER — Other Ambulatory Visit: Payer: Self-pay | Admitting: *Deleted

## 2013-06-14 DIAGNOSIS — M4800 Spinal stenosis, site unspecified: Secondary | ICD-10-CM

## 2013-06-14 MED ORDER — OXYCODONE-ACETAMINOPHEN 5-325 MG PO TABS: ORAL_TABLET | ORAL | Status: DC

## 2013-06-14 MED ORDER — METOPROLOL TARTRATE 50 MG PO TABS: 50.0000 mg | ORAL_TABLET | Freq: Two times a day (BID) | ORAL | Status: DC

## 2013-06-16 ENCOUNTER — Other Ambulatory Visit: Payer: Self-pay | Admitting: *Deleted

## 2013-06-16 MED ORDER — AMITRIPTYLINE HCL 25 MG PO TABS: ORAL_TABLET | ORAL | Status: DC

## 2013-06-16 NOTE — Telephone Encounter (Signed)
Faxed Rx to [facility] on [address] and patient notified and was also told before anymore refills can be given next time she needs to schedule an appointment.

## 2014-04-28 ENCOUNTER — Emergency Department (HOSPITAL_COMMUNITY)
Admission: EM | Admit: 2014-04-28 | Discharge: 2014-04-28 | Disposition: A | Discharge status: Home / Self Care | Payer: Self-pay | Attending: Emergency Medicine | Admitting: Emergency Medicine

## 2014-04-28 ENCOUNTER — Emergency Department (HOSPITAL_COMMUNITY): Payer: Medicare Other

## 2014-04-28 ENCOUNTER — Encounter (HOSPITAL_COMMUNITY): Payer: Self-pay | Admitting: Emergency Medicine

## 2014-04-28 DIAGNOSIS — I10 Essential (primary) hypertension: Secondary | ICD-10-CM | POA: Insufficient documentation

## 2014-04-28 DIAGNOSIS — Z9181 History of falling: Secondary | ICD-10-CM | POA: Insufficient documentation

## 2014-04-28 DIAGNOSIS — F329 Major depressive disorder, single episode, unspecified: Secondary | ICD-10-CM | POA: Insufficient documentation

## 2014-04-28 DIAGNOSIS — M7989 Other specified soft tissue disorders: Secondary | ICD-10-CM

## 2014-04-28 DIAGNOSIS — G629 Polyneuropathy, unspecified: Secondary | ICD-10-CM | POA: Insufficient documentation

## 2014-04-28 DIAGNOSIS — Z79899 Other long term (current) drug therapy: Secondary | ICD-10-CM | POA: Insufficient documentation

## 2014-04-28 DIAGNOSIS — M79609 Pain in unspecified limb: Secondary | ICD-10-CM

## 2014-04-28 DIAGNOSIS — M549 Dorsalgia, unspecified: Secondary | ICD-10-CM

## 2014-04-28 DIAGNOSIS — R200 Anesthesia of skin: Secondary | ICD-10-CM | POA: Insufficient documentation

## 2014-04-28 DIAGNOSIS — M1990 Unspecified osteoarthritis, unspecified site: Secondary | ICD-10-CM | POA: Insufficient documentation

## 2014-04-28 DIAGNOSIS — Z7951 Long term (current) use of inhaled steroids: Secondary | ICD-10-CM | POA: Insufficient documentation

## 2014-04-28 DIAGNOSIS — Z7982 Long term (current) use of aspirin: Secondary | ICD-10-CM | POA: Insufficient documentation

## 2014-04-28 DIAGNOSIS — M5417 Radiculopathy, lumbosacral region: Secondary | ICD-10-CM | POA: Insufficient documentation

## 2014-04-28 DIAGNOSIS — E559 Vitamin D deficiency, unspecified: Secondary | ICD-10-CM | POA: Insufficient documentation

## 2014-04-28 MED ORDER — DIAZEPAM 5 MG PO TABS: 5.0000 mg | ORAL_TABLET | Freq: Four times a day (QID) | ORAL | Status: DC | PRN

## 2014-04-28 MED ORDER — HYDROMORPHONE HCL 1 MG/ML IJ SOLN
1.0000 mg | Freq: Once | INTRAMUSCULAR | Status: AC
  Administered 2014-04-28: 1 mg via INTRAMUSCULAR
  Filled 2014-04-28: qty 1

## 2014-04-28 MED ORDER — DEXAMETHASONE SODIUM PHOSPHATE 10 MG/ML IJ SOLN
10.0000 mg | Freq: Once | INTRAMUSCULAR | Status: AC
Start: 2014-04-28 — End: 2014-04-28
  Administered 2014-04-28: 10 mg via INTRAMUSCULAR
  Filled 2014-04-28: qty 1

## 2014-04-28 MED ORDER — PREDNISONE 50 MG PO TABS: ORAL_TABLET | ORAL | Status: DC

## 2014-04-28 MED ORDER — DIAZEPAM 5 MG PO TABS
5.0000 mg | ORAL_TABLET | Freq: Once | ORAL | Status: AC
  Administered 2014-04-28: 5 mg via ORAL
  Filled 2014-04-28: qty 1

## 2014-04-28 NOTE — ED Provider Notes (Signed)
Patient seen in the ED for BLE pain from back and radiation into her toes.  It is a burning sensation.  She has no tenderness to palpation of her back, straight leg raise test is negative.  She denies red flags of malignancy, IV drug use, fever, weight loss.  Will obtain imaging due to new back pain and her age.  Likely pain control and DC with PCP fu.  Medical screening examination/treatment/procedure(s) were conducted as a shared visit with non-physician practitioner(s) and myself.  I personally evaluated the patient during the encounter.   EKG Interpretation None        Tomasita Crumble, MD 04/28/14 1359

## 2014-04-28 NOTE — Progress Notes (Signed)
*  PRELIMINARY RESULTS* Vascular Ultrasound Lower extremity venous duplex has been completed.  Preliminary findings: No evidence of DVT or baker's cyst.  Farrel Demark, RDMS, RVT  04/28/2014, 8:36 AM

## 2014-04-28 NOTE — Discharge Instructions (Signed)
Continue to take oxycodone for pain. Take prednisone for inflammation and nerve pain as prescribed until all gone, next dose tomorrow since your got a shot of this medicine in ED. Take valium as prescribed as needed for spasms. Follow up with your doctor for recheck. Return if any fever, extremity weakness, inability to control bowels or urine.   Lumbosacral Radiculopathy Lumbosacral radiculopathy is a pinched nerve or nerves in the low back (lumbosacral area). When this happens you may have weakness in your legs and may not be able to stand on your toes. You may have pain going down into your legs. There may be difficulties with walking normally. There are many causes of this problem. Sometimes this may happen from an injury, or simply from arthritis or boney problems. It may also be caused by other illnesses such as diabetes. If there is no improvement after treatment, further studies may be done to find the exact cause. DIAGNOSIS  X-rays may be needed if the problems become long standing. Electromyograms may be done. This study is one in which the working of nerves and muscles is studied. HOME CARE INSTRUCTIONS   Applications of ice packs may be helpful. Ice can be used in a plastic bag with a towel around it to prevent frostbite to skin. This may be used every 2 hours for 20 to 30 minutes, or as needed, while awake, or as directed by your caregiver.  Only take over-the-counter or prescription medicines for pain, discomfort, or fever as directed by your caregiver.  If physical therapy was prescribed, follow your caregiver's directions. SEEK IMMEDIATE MEDICAL CARE IF:   You have pain not controlled with medications.  You seem to be getting worse rather than better.  You develop increasing weakness in your legs.  You develop loss of bowel or bladder control.  You have difficulty with walking or balance, or develop clumsiness in the use of your legs.  You have a fever. MAKE SURE YOU:    Understand these instructions.  Will watch your condition.  Will get help right away if you are not doing well or get worse. Document Released: 06/16/2005 Document Revised: 09/08/2011 Document Reviewed: 02/04/2008 [facility] Patient Information 2015 [place], [place]. This information is not intended to replace advice given to you by your health care provider. Make sure you discuss any questions you have with your health care provider.

## 2014-04-28 NOTE — ED Notes (Signed)
Pt comes from home via [facility], has hx of sciatica pain, pain started getting worse yesterday morning around 9. States this is worst it has ever been. Also c/o of headache.

## 2014-04-28 NOTE — ED Provider Notes (Signed)
CSN: [id number]     Arrival date & time 04/28/14  0510 History   First MD Initiated Contact with Patient 04/28/14 0600     Chief Complaint  Patient presents with  . Sciatica     (Consider location/radiation/quality/duration/timing/severity/associated sxs/prior Treatment) HPI Melinda [name] is a 63 y.o. female with history of myositis, ray not syndrome, sciatica, fibromyalgia, who presents to ed with complaint bilateral leg pain. Pt states she has hx of sciatica. States this pain started yesterday morning the patient woke up. Pain is in bilateral calves, feet, radiating up to her thighs. Patient denies any injuries or any strenuous activities the day before. She does admit to some new swelling in her bilateral legs. She states her feet feeltingly and burning. She does admit to back pain. Walking and laying on one side makes pain worse. Patient takes oxycodone daily for chronic pain. She states however this pain is new. She denies any weakness in her legs. She states that do fill mom at times. She denies any loss of bowel or bladder function. She denies any fevers. No abdominal pain. She last took 2 Percocets at 11 PM last night. She also takes gabapentin. She states that she did not get any relief with those medications. Patient reports prior similar episode, states was told it was sciatica. She states she was tested for diabetes and HIV recently and was negative.  Past Medical History  Diagnosis Date  . Myalgia and myositis, unspecified   . Unspecified vitamin D deficiency   . Depressive disorder, not elsewhere classified   . Essential hypertension, benign   . Raynaud's syndrome   . Sciatica   . Enthesopathy of hip region   . Muscle weakness (generalized)   . Other malaise and fatigue   . Other abnormal blood chemistry   . Personal history of arthritis   . Personal history of fall    Past Surgical History  Procedure Laterality Date  . Abdominal hysterectomy  1983   Family History   Problem Relation Age of Onset  . Dementia Mother   . Heart disease Mother   . Hypertension Mother   . Diabetes Mother   . Diabetes Sister   . Cancer Sister     lung   History  Substance Use Topics  . Smoking status: Never Smoker   . Smokeless tobacco: Not on file  . Alcohol Use: No   OB History   Grav Para Term Preterm Abortions TAB SAB Ect Mult Living                 Review of Systems  Constitutional: Negative for fever and chills.  Respiratory: Negative for cough, chest tightness and shortness of breath.   Cardiovascular: Negative for chest pain, palpitations and leg swelling.  Gastrointestinal: Negative for nausea, vomiting, abdominal pain and diarrhea.  Genitourinary: Negative for dysuria, frequency and flank pain.  Musculoskeletal: Positive for arthralgias, back pain, joint swelling and myalgias. Negative for neck pain and neck stiffness.  Skin: Negative for rash.  Neurological: Positive for numbness. Negative for dizziness, weakness and headaches.  All other systems reviewed and are negative.     Allergies  Review of patient's allergies indicates no known allergies.  Home Medications   Prior to Admission medications   Medication Sig Start Date End Date Taking? Authorizing Provider  amitriptyline (ELAVIL) 25 MG tablet Take one tablet by mouth once daily at bedtime 06/16/13  Yes Sharon Seller, NP  amLODipine (NORVASC) 5 MG tablet Take 1 tablet (  5 mg total) by mouth daily. 01/03/13  Yes Tiffany L Reed, DO  aspirin 81 MG tablet Take 81 mg by mouth daily.   Yes Historical Provider, MD  fluticasone (FLONASE) 50 MCG/ACT nasal spray Place 2 sprays into the nose daily as needed for allergies. Two sprays into each nostril twice a day.   Yes Historical Provider, MD  gabapentin (NEURONTIN) 400 MG capsule Take 1 capsule (400 mg total) by mouth 3 (three) times daily. 01/03/13  Yes Tiffany L Reed, DO  lisinopril (PRINIVIL,ZESTRIL) 2.5 MG tablet Take 1 tablet (2.5 mg total) by  mouth daily. 01/03/13  Yes Tiffany L Reed, DO  metoprolol (LOPRESSOR) 50 MG tablet Take 1 tablet (50 mg total) by mouth 2 (two) times daily. 06/14/13  Yes Kimber Relic, MD  oxyCODONE-acetaminophen (ROXICET) 5-325 MG per tablet Take 1 tablet every 6 hour as needed for mild to moderate pain and 2 tablet every 6 hour as needed for severe pain 06/14/13  Yes Kimber Relic, MD  vitamin D, CHOLECALCIFEROL, 400 UNITS tablet Take by mouth daily. Take three  Capsules by mouth to equal 1200mg  daily.   Yes Historical Provider, MD   BP 147/77  Pulse 69  Temp(Src) 97.8 F (36.6 C) (Oral)  Resp 11  Ht 5\' 4"  (1.626 m)  Wt 239 lb (108.41 kg)  BMI 41.00 kg/m2  SpO2 100% Physical Exam  Nursing note and vitals reviewed. Constitutional: She is oriented to person, place, and time. She appears well-developed and well-nourished. No distress.  HENT:  Head: Normocephalic.  Eyes: Conjunctivae are normal.  Neck: Neck supple.  Cardiovascular: Normal rate, regular rhythm and normal heart sounds.   Pulmonary/Chest: Effort normal and breath sounds normal. No respiratory distress. She has no wheezes. She has no rales.  Abdominal: Soft. Bowel sounds are normal. She exhibits no distension. There is no tenderness. There is no rebound.  Musculoskeletal:  Nonpitting edema in bilateral lower extremities knees down. Tender to palpation over bilateral calves, bilateral ankles. Full range of motion of bilateral hips, knees, ankles. Positive Homans sign bilaterally. DP pulses are intact and equal bilaterally. No pain with bilateral straight leg raise. There is some tenderness over midline lumbar spine, tenderness over bilateral lumbar paraspinal muscles.  Neurological: She is alert and oriented to person, place, and time.  5/5 and equal lower extremity strength. 2+ and equal patellar reflexes bilaterally. Pt able to dorsiflex bilateral toes and feet with good strength against resistance. Equal sensation bilaterally over thighs and  lower legs.   Skin: Skin is warm and dry.  Psychiatric: She has a normal mood and affect. Her behavior is normal.    ED Course  Procedures (including critical care time) Labs Review Labs Reviewed - No data to display  Imaging Review Dg Lumbar Spine Complete  04/28/2014   CLINICAL DATA:  No injury. Sharp lower back pain now in the posterior lower legs.  EXAM: LUMBAR SPINE - COMPLETE 4+ VIEW  COMPARISON:  MRI 01/20/2013  FINDINGS: There is no evidence of lumbar spine fracture. Alignment is normal. Intervertebral disc spaces are maintained.  IMPRESSION: Negative.   Electronically Signed   By: 04/30/2014 M.D.   On: 04/28/2014 07:03     EKG Interpretation None      MDM   Final diagnoses:  Back pain    Patient with bilateral lower leg pain and lower back pain. Reports tingling and numbness sensation in bilateral feet. Feet examined and appeared to be normal. She does have however some  calf tenderness and positive Homans sign with some lower extremity swelling bilaterally. Although bilateral DVT is unlikely, will get an ultrasound to rule it out. This is most likely lumbar root radiculopathy, given midline tenderness and no prior imaging will get an x-ray of the lumbar spine. Patient is otherwise afebrile, no signs of cauda equina at this time, she denies any IV drug use. No emergent indication for MRI. Will treat pain and reassess.Marland Kitchen  7:34 AM Pt reassessed. Pain is improved. Waiting on venous dopplers  8:53 AM Patient's venous Dopplers are negative. Her pain is still improved, however she does report still burning inhaler bilateral feet. Will discharge home, she has Percocet at home for pain, will add a muscle relaxant, prednisone taper. Follow-up with primary care doctor. She is ambulatory with no difficulties in ER.  Filed Vitals:   04/28/14 0630 04/28/14 0701 04/28/14 0730 04/28/14 0858  BP: 137/63 130/69 126/64 112/59  Pulse: 65 63 64 69  Temp:  97.6 F (36.4 C)  97.7 F (36.5  C)  TempSrc:  Oral  Oral  Resp: 10 12 13 20   Height:      Weight:      SpO2: 100% 100% 97% 100%     Willine Schwalbe A Saidah Kempton, PA-C 04/28/14 1158

## 2014-06-13 DIAGNOSIS — M797 Fibromyalgia: Secondary | ICD-10-CM | POA: Insufficient documentation

## 2014-07-12 DIAGNOSIS — K7689 Other specified diseases of liver: Secondary | ICD-10-CM | POA: Diagnosis not present

## 2014-07-13 ENCOUNTER — Ambulatory Visit (INDEPENDENT_AMBULATORY_CARE_PROVIDER_SITE_OTHER): Payer: Commercial Managed Care - HMO | Admitting: Neurology

## 2014-07-13 ENCOUNTER — Encounter: Payer: Self-pay | Admitting: Neurology

## 2014-07-13 VITALS — BP 118/69 | HR 84 | Ht 63.0 in | Wt 228.0 lb

## 2014-07-13 DIAGNOSIS — R269 Unspecified abnormalities of gait and mobility: Secondary | ICD-10-CM | POA: Diagnosis not present

## 2014-07-13 DIAGNOSIS — M79609 Pain in unspecified limb: Secondary | ICD-10-CM | POA: Diagnosis not present

## 2014-07-13 DIAGNOSIS — M79603 Pain in arm, unspecified: Secondary | ICD-10-CM

## 2014-07-13 MED ORDER — CELECOXIB 100 MG PO CAPS: 100.0000 mg | ORAL_CAPSULE | Freq: Two times a day (BID) | ORAL | Status: DC

## 2014-07-13 NOTE — Progress Notes (Signed)
PATIENT: Melinda [name] DOB: 03/08/1951  HISTORICAL  Melinda [name] is 64 yo right-handed African-American female, referred by her primary care for Dr. Providence Lanius for evaluation of whole-body achy pain, bilateral hands, and feet paresthesia  She carries a diagnosis of fibromyalgia, over the years, was treated with oxycodone, used to take 120 tablets each month, recently with cutting back of her dosage, She is also taking amitriptyline every night, gabapentin   Reviewing the chart, she had MRI of cervical, thoracic, lumbar spine in 2014, showed mild degenerative disc disease, no significant canal, or foraminal stenosis,   She went on disability because of her fibromyalgia, diffuse body achy pain at age 65, came in with a walker, in tears, complains of constant body achy pain, intermittent bilateral hands, feet, burning, at the same time frozen sensation, difficulty walking, difficulty bearing weight, asking for more oxycodone,  She also complains of pain traveling along her spine,   REVIEW OF SYSTEMS: Full 14 system review of systems performed and notable only for feeling hot, cold, achy muscles, headaches, numbness weakness, tremor   ALLERGIES: No Known Allergies  HOME MEDICATIONS: Current Outpatient Prescriptions on File Prior to Visit  Medication Sig Dispense Refill  . amitriptyline (ELAVIL) 25 MG tablet Take one tablet by mouth once daily at bedtime 30 tablet 0  . aspirin 81 MG tablet Take 81 mg by mouth daily.    . fluticasone (FLONASE) 50 MCG/ACT nasal spray Place 2 sprays into the nose daily as needed for allergies. Two sprays into each nostril twice a day.    . gabapentin (NEURONTIN) 400 MG capsule Take 1 capsule (400 mg total) by mouth 3 (three) times daily. 90 capsule 3  . metoprolol (LOPRESSOR) 50 MG tablet Take 1 tablet (50 mg total) by mouth 2 (two) times daily. 60 tablet 3  . oxyCODONE-acetaminophen (ROXICET) 5-325 MG per tablet Take 1 tablet every 6 hour as needed for mild  to moderate pain and 2 tablet every 6 hour as needed for severe pain 240 tablet 0  . vitamin D, CHOLECALCIFEROL, 400 UNITS tablet Take by mouth daily. Take three  Capsules by mouth to equal 1200mg  daily.     No current facility-administered medications on file prior to visit.    PAST MEDICAL HISTORY: Past Medical History  Diagnosis Date  . Myalgia and myositis, unspecified   . Unspecified vitamin D deficiency   . Depressive disorder, not elsewhere classified   . Essential hypertension, benign   . Raynaud's syndrome   . Sciatica   . Enthesopathy of hip region   . Muscle weakness (generalized)   . Other malaise and fatigue   . Other abnormal blood chemistry   . Personal history of arthritis   . Personal history of fall     PAST SURGICAL HISTORY: Past Surgical History  Procedure Laterality Date  . Abdominal hysterectomy  1983    FAMILY HISTORY: Family History  Problem Relation Age of Onset  . Dementia Mother   . Heart disease Mother   . Hypertension Mother   . Diabetes Mother   . Diabetes Sister   . Cancer Sister     lung    SOCIAL HISTORY:  History   Social History  . Marital Status: Single    Spouse Name: N/A    Number of Children: 3  . Years of Education: 13   Occupational History    Retired at 38 because of fibromyogia   Social History Main Topics  . Smoking status: Never Smoker   .  Smokeless tobacco: Never Used  . Alcohol Use: No  . Drug Use: No  . Sexual Activity: Not on file   Other Topics Concern  . Not on file   Social History Narrative   Patient lives with her grandson. Patient is retired.   Education some college.   Right handed.    Caffeine two cups daily.    PHYSICAL EXAM   Filed Vitals:   07/13/14 0818  BP: 118/69  Pulse: 84  Height: 5\' 3"  (1.6 m)  Weight: 228 lb (103.42 kg)    Not recorded      Body mass index is 40.4 kg/(m^2).   Generalized: In no acute distress  Neck: Supple, no carotid bruits   Cardiac: Regular  rate rhythm  Pulmonary: Clear to auscultation bilaterally  Musculoskeletal: No deformity  Neurological examination  Mentation: Alert oriented to time, place, history taking, and causual conversation  Cranial nerve II-XII: Pupils were equal round reactive to light. Extraocular movements were full.  Visual field were full on confrontational test. Bilateral fundi were sharp.  Facial sensation and strength were normal. Hearing was intact to finger rubbing bilaterally. Uvula tongue midline.  Head turning and shoulder shrug and were normal and symmetric.Tongue protrusion into cheek strength was normal.  Motor: Normal tone, bulk and strength.  Sensory: Intact to fine touch, pinprick, preserved vibratory sensation, and proprioception at toes.  Coordination: Normal finger to nose, heel-to-shin bilaterally there was no truncal ataxia  Gait: Rising up from seated position by pushing on chair arm, deliberate,   Romberg signs: Negative  Deep tendon reflexes: Brachioradialis 2/2, biceps 2/2, triceps 2/2, patellar 2/2, Achilles 2/2, plantar responses were flexor bilaterally.   DIAGNOSTIC DATA (LABS, IMAGING, TESTING) - I reviewed patient records, labs, notes, testing and imaging myself where available.  Lab Results  Component Value Date   WBC 5.8 11/30/2012   HGB 13.7 11/30/2012   HCT 41.5 11/30/2012   MCV 83 11/30/2012      Component Value Date/Time   NA 142 11/30/2012 1058   K 4.7 11/30/2012 1058   CL 100 11/30/2012 1058   CO2 24 11/30/2012 1058   GLUCOSE 91 11/30/2012 1058   BUN 9 11/30/2012 1058   CREATININE 0.88 11/30/2012 1058   CALCIUM 9.2 11/30/2012 1058   PROT 6.9 11/30/2012 1058   AST 10 11/30/2012 1058   ALT 6 11/30/2012 1058   ALKPHOS 77 11/30/2012 1058   BILITOT 0.5 11/30/2012 1058   GFRNONAA 71 11/30/2012 1058   GFRAA 82 11/30/2012 1058   ASSESSMENT AND PLAN  Melinda [name] is a 64 y.o. female complains of Diffuse body achy pain, paresthesia no significant  deficit on examinations,  1, MRI of the brain to rule out central nervous system etiology 2. EMG nerve conduction study  3. Laboratory evaluations  68, M.D. Ph.D.  [facility] [address] [place], [place] [phone]

## 2014-07-14 ENCOUNTER — Encounter: Payer: Self-pay | Admitting: Internal Medicine

## 2014-07-14 ENCOUNTER — Ambulatory Visit (INDEPENDENT_AMBULATORY_CARE_PROVIDER_SITE_OTHER): Payer: Commercial Managed Care - HMO | Admitting: Internal Medicine

## 2014-07-14 VITALS — BP 124/72 | HR 72 | Temp 97.6°F | Resp 10 | Ht 64.0 in | Wt 209.5 lb

## 2014-07-14 DIAGNOSIS — M4800 Spinal stenosis, site unspecified: Secondary | ICD-10-CM

## 2014-07-14 DIAGNOSIS — M791 Myalgia: Secondary | ICD-10-CM

## 2014-07-14 DIAGNOSIS — I10 Essential (primary) hypertension: Secondary | ICD-10-CM

## 2014-07-14 DIAGNOSIS — G629 Polyneuropathy, unspecified: Secondary | ICD-10-CM

## 2014-07-14 DIAGNOSIS — E669 Obesity, unspecified: Secondary | ICD-10-CM | POA: Diagnosis not present

## 2014-07-14 DIAGNOSIS — IMO0001 Reserved for inherently not codable concepts without codable children: Secondary | ICD-10-CM

## 2014-07-14 DIAGNOSIS — Z23 Encounter for immunization: Secondary | ICD-10-CM | POA: Diagnosis not present

## 2014-07-14 DIAGNOSIS — M609 Myositis, unspecified: Secondary | ICD-10-CM

## 2014-07-14 LAB — RPR: RPR Ser Ql: NONREACTIVE

## 2014-07-14 LAB — FOLATE: Folate: 6.3 ng/mL (ref >3.0)

## 2014-07-14 LAB — THYROID PANEL WITH TSH
Free Thyroxine Index: 2.9 (ref 1.2–4.9)
T3 Uptake Ratio: 30 % (ref 24–39)
T4, Total: 9.7 ug/dL (ref 4.5–12.0)
TSH: 3.29 u[IU]/mL (ref 0.450–4.500)

## 2014-07-14 LAB — SEDIMENTATION RATE: Sed Rate: 8 mm/hr (ref 0–40)

## 2014-07-14 LAB — C-REACTIVE PROTEIN: CRP: 16.8 mg/L — ABNORMAL HIGH (ref 0.0–4.9)

## 2014-07-14 LAB — CK: Total CK: 17 U/L — ABNORMAL LOW (ref 24–173)

## 2014-07-14 LAB — ANA W/REFLEX IF POSITIVE: Anti Nuclear Antibody(ANA): NEGATIVE

## 2014-07-14 MED ORDER — PREGABALIN 75 MG PO CAPS: 75.0000 mg | ORAL_CAPSULE | Freq: Every day | ORAL | Status: DC

## 2014-07-14 MED ORDER — OXYCODONE-ACETAMINOPHEN 5-325 MG PO TABS: ORAL_TABLET | ORAL | Status: DC

## 2014-07-14 MED ORDER — AMITRIPTYLINE HCL 25 MG PO TABS: ORAL_TABLET | ORAL | Status: DC

## 2014-07-14 NOTE — Progress Notes (Signed)
Patient ID: Melinda [name], female   DOB: 1950-09-13, 64 y.o.   MRN: [id number]    Facility  [facility]    Place of Service:   OFFICE   No Known Allergies  Chief Complaint  Patient presents with  . Establish Care    New patient esgtablish care, patient was seen here in 2003, moved to [place], moved back 03/2014. Patient c/o constant left arm numbness. Mediation management- discuss dose and instructions for meds -? increase   . Immunizations    Discuss Prevnar   . FYI    Had diabetes testing in November 2015- No diabetes     HPI:  64 yo female seen as a new patient for above. She has chronic b/l upper and lower extremity numbness and burning. She is taking 4 tabs of gabapentin TID along with oxycodone 2 tabs BID to "get through" the day and night. She saw neurology Dr Debarah Crape yesterday and MRI brain ordered along with EMG/NCS. Celebrex BID added to drug regimen. Previous PCP is Dr Providence Lanius and was referred to pain mx but no appt has been made yet.  She has a hx spinal stenosis, sciatica. FMS. She had an A1c done in the fall 2015 at [facility] which was <6.5  She has cold sensation in her hands and feels chills   [place] in [place] 2015  Medications: Patient's Medications  New Prescriptions   No medications on file  Previous Medications   AMITRIPTYLINE (ELAVIL) 25 MG TABLET    Take one tablet by mouth once daily at bedtime   ASPIRIN 81 MG TABLET    Take 81 mg by mouth daily.   FLUTICASONE (FLONASE) 50 MCG/ACT NASAL SPRAY    Place 2 sprays into the nose daily as needed for allergies. Two sprays into each nostril twice a day.   GABAPENTIN (NEURONTIN) 400 MG CAPSULE    Take 1 capsule (400 mg total) by mouth 3 (three) times daily.   LORATADINE (CLARITIN) 10 MG TABLET    Take 10 mg by mouth daily.   METOPROLOL (LOPRESSOR) 50 MG TABLET    Take 1 tablet (50 mg total) by mouth 2 (two) times daily.   OXYCODONE-ACETAMINOPHEN (ROXICET) 5-325 MG PER TABLET    Take 1 tablet every 6 hour  as needed for mild to moderate pain and 2 tablet every 6 hour as needed for severe pain   VITAMIN D, CHOLECALCIFEROL, 400 UNITS TABLET    Take by mouth daily. Take three  Capsules by mouth to equal 1200mg  daily.  Modified Medications   No medications on file  Discontinued Medications   CELECOXIB (CELEBREX) 100 MG CAPSULE    Take 1 capsule (100 mg total) by mouth 2 (two) times daily.     Review of Systems  Constitutional: Positive for chills. Negative for fever, diaphoresis, activity change, appetite change and fatigue.  HENT: Negative for ear pain and sore throat.   Eyes: Negative for visual disturbance.  Respiratory: Negative for cough, chest tightness and shortness of breath.   Cardiovascular: Negative for chest pain, palpitations and leg swelling.  Gastrointestinal: Positive for nausea (with reduced appetite) and abdominal pain. Negative for vomiting, diarrhea, constipation and blood in stool.  Genitourinary: Negative for dysuria.  Musculoskeletal: Positive for myalgias, back pain, joint swelling, arthralgias, gait problem and neck pain.  Skin: Negative for rash.  Neurological: Positive for weakness and numbness. Negative for dizziness, tremors, seizures and headaches.  Psychiatric/Behavioral: Positive for sleep disturbance. The patient is not nervous/anxious.  Filed Vitals:   07/14/14 1004  BP: 124/72  Pulse: 72  Temp: 97.6 F (36.4 C)  TempSrc: Oral  Resp: 10  Height: 5\' 4"  (1.626 m)  Weight: 209 lb 8 oz (95.029 kg)   Body mass index is 35.94 kg/(m^2).  Physical Exam  CONSTITUTIONAL: Looks uncomfortable in NAD. Awake, alert and oriented x 3 HEENT: PERRLA. No scleral icterus. Oropharynx clear and without exudate. MM dry NECK: Supple. Nontender. No palpable cervical or supraclavicular lymph nodes. No carotid bruit b/l. No thyromegaly or thyroid mass palpable.  CVS: Regular rate without murmur, gallop or rub. LUNGS: CTA b/l no wheezing, rales or rhonchi. ABDOMEN:  Bowel sounds present x 4. Soft, nondistended. No palpable mass or bruit. Epigastric TTP but no r/g/r EXTREMITIES: Trace LE edema b/l. Distal pulses palpable. No calf tenderness. MUSC: L>R (+) Tinel's sign; antalgic gait; excellent capillary refill; grip strength slighty reduced on right; multiple fibromyalgia TPs in neck, back, ACW, UE and LE; multiple small and large joint swelling; uses rolling walker with brakes/seat to ambulate PSYCH: Affect, behavior and mood normal  Labs reviewed: Office Visit on 07/13/2014  Component Date Value Ref Range Status  . RPR Ser Ql 07/13/2014 Non Reactive  Non Reactive Final  . Folate 07/13/2014 6.3  >3.0 ng/mL Final   Comment: A serum folate concentration of less than 3.1 ng/mL is considered to represent clinical deficiency.   . CRP 07/13/2014 16.8* 0.0 - 4.9 mg/L Final  . TSH 07/13/2014 3.290  0.450 - 4.500 uIU/mL Final  . T4, Total 07/13/2014 9.7  4.5 - 12.0 ug/dL Final  . T3 Uptake Ratio 07/13/2014 30  24 - 39 % Final  . Free Thyroxine Index 07/13/2014 2.9  1.2 - 4.9 Final  . Total CK 07/13/2014 17* 24 - 173 U/L Final  . Sed Rate 07/13/2014 8  0 - 40 mm/hr Final  . ANA Ser Ql 07/13/2014 WILL FOLLOW   Preliminary   Lab results reviewed  Assessment/Plan      ICD-9-CM ICD-10-CM   1. Peripheral neuropathy 356.9 G62.9 pregabalin (LYRICA) 75 MG capsule  2. Spinal stenosis, unspecified spinal region 724.00 M48.00 oxyCODONE-acetaminophen (ROXICET) 5-325 MG per tablet  3. Myalgia and myositis 729.1 M79.1 amitriptyline (ELAVIL) 25 MG tablet    M60.9 oxyCODONE-acetaminophen (ROXICET) 5-325 MG per tablet  4. Essential hypertension 401.9 I10   5. Need for pneumococcal vaccination V03.82 Z23 Pneumococcal conjugate vaccine 13-valent    - prevnar given today  -encouraged her to take meds as rx.   -f/u with neuro as scheduled. Keep appt for imaging studies  - may need ortho eval for carpal tunnel. Encouraged her to get a carpal tunnel brace from local  pharmacy  RTO in 1 month for re-eval. Check CMP today to eval LFTs and lytes due to increased gabapentin use   Jahaira Earnhart S. Ancil Linsey  [facility] [address] [place], [place] [address] [phone] Office (Wednesdays and Fridays 8 AM - 5 PM) [phone] Cell (Monday-Friday 8 AM - 5 PM)

## 2014-07-14 NOTE — Patient Instructions (Signed)
Take all medications as ordered  F/u in 1 month to re-evaluate neuropathy  F/u with neurology as scheduled

## 2014-07-15 LAB — COMPREHENSIVE METABOLIC PANEL
ALT: 9 IU/L (ref 0–32)
AST: 9 IU/L (ref 0–40)
Albumin/Globulin Ratio: 1.5 (ref 1.1–2.5)
Albumin: 3.8 g/dL (ref 3.6–4.8)
Alkaline Phosphatase: 67 IU/L (ref 39–117)
BUN/Creatinine Ratio: 12 (ref 11–26)
BUN: 10 mg/dL (ref 8–27)
CO2: 22 mmol/L (ref 18–29)
Calcium: 9.2 mg/dL (ref 8.7–10.3)
Chloride: 100 mmol/L (ref 97–108)
Creatinine, Ser: 0.84 mg/dL (ref 0.57–1.00)
GFR calc Af Amer: 86 mL/min/{1.73_m2} (ref >59)
GFR calc non Af Amer: 75 mL/min/{1.73_m2} (ref >59)
Globulin, Total: 2.6 g/dL (ref 1.5–4.5)
Glucose: 84 mg/dL (ref 65–99)
Potassium: 4.6 mmol/L (ref 3.5–5.2)
Sodium: 143 mmol/L (ref 134–144)
Total Bilirubin: 0.9 mg/dL (ref 0.0–1.2)
Total Protein: 6.4 g/dL (ref 6.0–8.5)

## 2014-07-17 NOTE — Progress Notes (Signed)
Quick Note:  Please call patient, mild elevated C-reactive protein of unknown clinical significance ______

## 2014-07-18 ENCOUNTER — Telehealth: Payer: Self-pay | Admitting: *Deleted

## 2014-07-18 ENCOUNTER — Encounter: Payer: Medicare PPO | Admitting: Radiology

## 2014-07-18 ENCOUNTER — Encounter: Payer: Medicare PPO | Admitting: Neurology

## 2014-07-18 ENCOUNTER — Other Ambulatory Visit: Payer: Self-pay | Admitting: Internal Medicine

## 2014-07-18 NOTE — Telephone Encounter (Signed)
Patient called and stated that Lyrica is not covered by her insurance and will cost her $200.00. Patient wants it changed to something else. Please Advise.

## 2014-07-18 NOTE — Telephone Encounter (Signed)
Has she ever tried doxepin for her pain? We can add that instead of lyrica.

## 2014-07-18 NOTE — Telephone Encounter (Signed)
Doxepin 50mg  #30 take 1 po qhs for pain with 1 RF

## 2014-07-18 NOTE — Telephone Encounter (Signed)
Patient stated that she has not tried this but is willing to try. Please advise dosing.

## 2014-07-19 MED ORDER — DOXEPIN HCL 50 MG PO CAPS
ORAL_CAPSULE | ORAL | Status: DC
Start: 2014-07-19 — End: 2014-09-15

## 2014-07-19 NOTE — Telephone Encounter (Signed)
Patient notified and Rx faxed to pharmacy.  

## 2014-07-21 ENCOUNTER — Inpatient Hospital Stay: Admission: RE | Admit: 2014-07-21 | Payer: Self-pay | Source: Ambulatory Visit

## 2014-07-30 ENCOUNTER — Other Ambulatory Visit: Payer: Self-pay

## 2014-08-16 ENCOUNTER — Ambulatory Visit: Payer: Commercial Managed Care - HMO | Admitting: Internal Medicine

## 2014-08-16 ENCOUNTER — Other Ambulatory Visit: Payer: Self-pay | Admitting: Internal Medicine

## 2014-08-17 ENCOUNTER — Other Ambulatory Visit: Payer: Self-pay

## 2014-08-17 DIAGNOSIS — IMO0001 Reserved for inherently not codable concepts without codable children: Secondary | ICD-10-CM

## 2014-08-17 DIAGNOSIS — M4800 Spinal stenosis, site unspecified: Secondary | ICD-10-CM

## 2014-08-17 MED ORDER — AMITRIPTYLINE HCL 25 MG PO TABS: ORAL_TABLET | ORAL | Status: DC

## 2014-08-17 MED ORDER — GABAPENTIN 400 MG PO CAPS: ORAL_CAPSULE | ORAL | Status: DC

## 2014-08-17 MED ORDER — METOPROLOL TARTRATE 50 MG PO TABS: 50.0000 mg | ORAL_TABLET | Freq: Two times a day (BID) | ORAL | Status: DC

## 2014-08-17 MED ORDER — OXYCODONE-ACETAMINOPHEN 5-325 MG PO TABS: ORAL_TABLET | ORAL | Status: DC

## 2014-08-17 MED ORDER — OXYCODONE-ACETAMINOPHEN 5-325 MG PO TABS
ORAL_TABLET | ORAL | Status: DC
Start: 2014-08-17 — End: 2014-09-15

## 2014-08-17 NOTE — Telephone Encounter (Signed)
Electronically sent Gabapentin and Metoprolol Printed off Oxycodone and Elavil

## 2014-08-28 ENCOUNTER — Ambulatory Visit: Payer: Commercial Managed Care - HMO | Admitting: Nurse Practitioner

## 2014-08-29 ENCOUNTER — Encounter: Payer: Self-pay | Admitting: Internal Medicine

## 2014-08-29 ENCOUNTER — Ambulatory Visit (INDEPENDENT_AMBULATORY_CARE_PROVIDER_SITE_OTHER): Payer: Commercial Managed Care - HMO | Admitting: Internal Medicine

## 2014-08-29 VITALS — BP 132/80 | HR 81 | Temp 97.7°F | Ht 64.0 in | Wt 235.0 lb

## 2014-08-29 DIAGNOSIS — M25511 Pain in right shoulder: Secondary | ICD-10-CM | POA: Diagnosis not present

## 2014-08-29 DIAGNOSIS — M542 Cervicalgia: Secondary | ICD-10-CM | POA: Diagnosis not present

## 2014-08-29 MED ORDER — METHOCARBAMOL 500 MG PO TABS
500.0000 mg | ORAL_TABLET | Freq: Three times a day (TID) | ORAL | Status: DC | PRN
Start: 2014-08-29 — End: 2014-10-26
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
## 2014-08-30 DIAGNOSIS — H5203 Hypermetropia, bilateral: Secondary | ICD-10-CM | POA: Diagnosis not present

## 2014-08-30 DIAGNOSIS — H2513 Age-related nuclear cataract, bilateral: Secondary | ICD-10-CM | POA: Diagnosis not present

## 2014-08-30 DIAGNOSIS — H52221 Regular astigmatism, right eye: Secondary | ICD-10-CM | POA: Diagnosis not present

## 2014-08-30 DIAGNOSIS — H35363 Drusen (degenerative) of macula, bilateral: Secondary | ICD-10-CM | POA: Diagnosis not present

## 2014-08-30 DIAGNOSIS — H524 Presbyopia: Secondary | ICD-10-CM | POA: Diagnosis not present

## 2014-08-30 DIAGNOSIS — H04123 Dry eye syndrome of bilateral lacrimal glands: Secondary | ICD-10-CM | POA: Diagnosis not present

## 2014-09-01 ENCOUNTER — Other Ambulatory Visit: Payer: Self-pay

## 2014-09-01 ENCOUNTER — Telehealth: Payer: Self-pay | Admitting: Internal Medicine

## 2014-09-01 DIAGNOSIS — M4800 Spinal stenosis, site unspecified: Secondary | ICD-10-CM

## 2014-09-01 NOTE — Telephone Encounter (Signed)
Chrae entered the referral for [facility] and I submitted the referral. I called the patient to notify her that this was taken care of and she was approved for her appointment with Dr. Terrace Arabia on Tuesday

## 2014-09-01 NOTE — Telephone Encounter (Signed)
[facility] called requesting a silverback referral for patient. The original referral came from her previous PCP Dr. Providence Lanius  Patient needs and appointment to discuss this with Dr. Montez Morita. [facility] is going to call the patient and let her know. There is no documentation of this being discussed in her previous visits

## 2014-09-01 NOTE — Telephone Encounter (Signed)
Patient called back very upset leaving a message on my voicemail she stated that she already talked to Dr. Montez Morita about her appointment for [facility] and said we are holding her up and that we NEEDED to approve her appointment. I have no referral for her for [facility] .Marland Kitchen I called the patient and explained this to her and let her know that I am unable to submit a referral without an order from the doctor and that I would send Dr. Montez Morita a message requesting a referral. Patient was very upset stating we have known about her appointment and now we are holding her up and she needs this appointment. She said we can look in her chart and see that another doctor referred her. I explained to the patient once again that I am unable to submit a referral that came from a doctor outside of this office. The patient stated she wants this taken care of TODAY!  She stated she doesn't know if she is going to be getting the best care if our office only has providers who work in the office 1 or 2 days a week. I apologized to the patient and told her I would do my best to get this taken care of and that I would send a message to Dr. Montez Morita.

## 2014-09-01 NOTE — Telephone Encounter (Signed)
Ok to refer to neurology for spinal stenosis with peripheral neuropathy

## 2014-09-04 ENCOUNTER — Ambulatory Visit
Admission: RE | Admit: 2014-09-04 | Discharge: 2014-09-04 | Disposition: A | Discharge status: Home / Self Care | Payer: Commercial Managed Care - HMO | Source: Ambulatory Visit | Attending: Neurology | Admitting: Neurology

## 2014-09-04 DIAGNOSIS — M79603 Pain in arm, unspecified: Secondary | ICD-10-CM

## 2014-09-05 ENCOUNTER — Encounter (INDEPENDENT_AMBULATORY_CARE_PROVIDER_SITE_OTHER): Payer: Self-pay | Admitting: Neurology

## 2014-09-05 ENCOUNTER — Ambulatory Visit (INDEPENDENT_AMBULATORY_CARE_PROVIDER_SITE_OTHER): Payer: Commercial Managed Care - HMO | Admitting: Neurology

## 2014-09-05 DIAGNOSIS — M79603 Pain in arm, unspecified: Secondary | ICD-10-CM | POA: Diagnosis not present

## 2014-09-05 DIAGNOSIS — M542 Cervicalgia: Secondary | ICD-10-CM | POA: Diagnosis not present

## 2014-09-05 DIAGNOSIS — Z0289 Encounter for other administrative examinations: Secondary | ICD-10-CM

## 2014-09-05 NOTE — Progress Notes (Signed)
PATIENT: Melinda [name] DOB: 1951-05-20  HISTORICAL  Melinda [name] is 64 yo right-handed African-American female, referred by her primary care for Dr. Lavone Neri for evaluation of whole-body achy pain, bilateral hands, and feet paresthesia  She carries a diagnosis of fibromyalgia, over the years, was treated with oxycodone, used to take 120 tablets each month, recently with cutting back of her dosage, She is also taking amitriptyline every night, gabapentin   Reviewing the chart, she had MRI of cervical, thoracic, lumbar spine in 2014, showed mild degenerative disc disease, no significant canal, or foraminal stenosis,   She went on disability because of her fibromyalgia, diffuse body achy pain at age 10, came in with a walker, in tears, complains of constant body achy pain, intermittent bilateral hands, feet, burning, at the same time frozen sensation, difficulty walking, difficulty bearing weight, asking for more oxycodone,  She also complains of pain traveling along her spine,  UPDATE March 8th 2016: I have reviewed MRI of the brain that was essentially normal, laboratory showed normal CMP, CBC, TSH, negative RPR, folic acid, mild elevated C reactive protein, normal ESR. CPK.   REVIEW OF SYSTEMS: Full 14 system review of systems performed and notable only for feeling hot, cold, achy muscles, headaches, numbness weakness, tremor   ALLERGIES: No Known Allergies  HOME MEDICATIONS: Current Outpatient Prescriptions on File Prior to Visit  Medication Sig Dispense Refill  . amitriptyline (ELAVIL) 25 MG tablet Take 2 tablets by mouth once daily at bedtime 60 tablet 0  . aspirin 81 MG tablet Take 81 mg by mouth daily.    Marland Kitchen doxepin (SINEQUAN) 50 MG capsule Take one tablet by mouth at bedtime for pain 30 capsule 1  . fluticasone (FLONASE) 50 MCG/ACT nasal spray Place 2 sprays into the nose daily as needed for allergies. Two sprays into each nostril twice a day.    . gabapentin (NEURONTIN) 400  MG capsule Three capsules by mouth twice daily 180 capsule 3  . loratadine (CLARITIN) 10 MG tablet Take 10 mg by mouth daily.    . methocarbamol (ROBAXIN) 500 MG tablet Take 1 tablet (500 mg total) by mouth every 8 (eight) hours as needed for muscle spasms. 30 tablet 0  . metoprolol (LOPRESSOR) 50 MG tablet Take 1 tablet (50 mg total) by mouth 2 (two) times daily. 60 tablet 3  . oxyCODONE-acetaminophen (ROXICET) 5-325 MG per tablet Take 1 tablet every 6 hours as needed for moderate-severe pain 120 tablet 0  . vitamin D, CHOLECALCIFEROL, 400 UNITS tablet Take by mouth daily. Take three  Capsules by mouth to equal 1217m daily.     No current facility-administered medications on file prior to visit.    PAST MEDICAL HISTORY: Past Medical History  Diagnosis Date  . Myalgia and myositis, unspecified   . Unspecified vitamin D deficiency   . Depressive disorder, not elsewhere classified   . Essential hypertension, benign   . Raynaud's syndrome   . Sciatica   . Enthesopathy of hip region   . Muscle weakness (generalized)   . Other malaise and fatigue   . Other abnormal blood chemistry   . Personal history of arthritis   . Personal history of fall   . Bursitis     PAST SURGICAL HISTORY: Past Surgical History  Procedure Laterality Date  . Abdominal hysterectomy  1985    FAMILY HISTORY: Family History  Problem Relation Age of Onset  . Dementia Mother   . Heart disease Mother   . Hypertension Mother   .    PATIENT: Melinda [name] DOB: 06/05/1951  HISTORICAL  Melinda [name] is 64 yo right-handed African-American female, referred by her primary care for Dr. Howell for evaluation of whole-body achy pain, bilateral hands, and feet paresthesia  She carries a diagnosis of fibromyalgia, over the years, was treated with oxycodone, used to take 120 tablets each month, recently with cutting back of her dosage, She is also taking amitriptyline every night, gabapentin   Reviewing the chart, she had MRI of cervical, thoracic, lumbar spine in 2014, showed mild degenerative disc disease, no significant canal, or foraminal stenosis,   She went on disability because of her fibromyalgia, diffuse body achy pain at age 64, came in with a walker, in tears, complains of constant body achy pain, intermittent bilateral hands, feet, burning, at the same time frozen sensation, difficulty walking, difficulty bearing weight, asking for more oxycodone,  She also complains of pain traveling along her spine,  UPDATE March 8th 2016: I have reviewed MRI of the brain that was essentially normal, laboratory showed normal CMP, CBC, TSH, negative RPR, folic acid, mild elevated C reactive protein, normal ESR. CPK.   REVIEW OF SYSTEMS: Full 14 system review of systems performed and notable only for feeling hot, cold, achy muscles, headaches, numbness weakness, tremor   ALLERGIES: No Known Allergies  HOME MEDICATIONS: Current Outpatient Prescriptions on File Prior to Visit  Medication Sig Dispense Refill  . amitriptyline (ELAVIL) 25 MG tablet Take 2 tablets by mouth once daily at bedtime 60 tablet 0  . aspirin 81 MG tablet Take 81 mg by mouth daily.    . doxepin (SINEQUAN) 50 MG capsule Take one tablet by mouth at bedtime for pain 30 capsule 1  . fluticasone (FLONASE) 50 MCG/ACT nasal spray Place 2 sprays into the nose daily as needed for allergies. Two sprays into each nostril twice a day.    . gabapentin (NEURONTIN) 400  MG capsule Three capsules by mouth twice daily 180 capsule 3  . loratadine (CLARITIN) 10 MG tablet Take 10 mg by mouth daily.    . methocarbamol (ROBAXIN) 500 MG tablet Take 1 tablet (500 mg total) by mouth every 8 (eight) hours as needed for muscle spasms. 30 tablet 0  . metoprolol (LOPRESSOR) 50 MG tablet Take 1 tablet (50 mg total) by mouth 2 (two) times daily. 60 tablet 3  . oxyCODONE-acetaminophen (ROXICET) 5-325 MG per tablet Take 1 tablet every 6 hours as needed for moderate-severe pain 120 tablet 0  . vitamin D, CHOLECALCIFEROL, 400 UNITS tablet Take by mouth daily. Take three  Capsules by mouth to equal 1200mg daily.     No current facility-administered medications on file prior to visit.    PAST MEDICAL HISTORY: Past Medical History  Diagnosis Date  . Myalgia and myositis, unspecified   . Unspecified vitamin D deficiency   . Depressive disorder, not elsewhere classified   . Essential hypertension, benign   . Raynaud's syndrome   . Sciatica   . Enthesopathy of hip region   . Muscle weakness (generalized)   . Other malaise and fatigue   . Other abnormal blood chemistry   . Personal history of arthritis   . Personal history of fall   . Bursitis     PAST SURGICAL HISTORY: Past Surgical History  Procedure Laterality Date  . Abdominal hysterectomy  1985    FAMILY HISTORY: Family History  Problem Relation Age of Onset  . Dementia Mother   . Heart disease Mother   . Hypertension Mother   .     PATIENT: Melinda [name] DOB: 06/05/1951  HISTORICAL  Melinda [name] is 64 yo right-handed African-American female, referred by her primary care for Dr. Howell for evaluation of whole-body achy pain, bilateral hands, and feet paresthesia  She carries a diagnosis of fibromyalgia, over the years, was treated with oxycodone, used to take 120 tablets each month, recently with cutting back of her dosage, She is also taking amitriptyline every night, gabapentin   Reviewing the chart, she had MRI of cervical, thoracic, lumbar spine in 2014, showed mild degenerative disc disease, no significant canal, or foraminal stenosis,   She went on disability because of her fibromyalgia, diffuse body achy pain at age 64, came in with a walker, in tears, complains of constant body achy pain, intermittent bilateral hands, feet, burning, at the same time frozen sensation, difficulty walking, difficulty bearing weight, asking for more oxycodone,  She also complains of pain traveling along her spine,  UPDATE March 8th 2016: I have reviewed MRI of the brain that was essentially normal, laboratory showed normal CMP, CBC, TSH, negative RPR, folic acid, mild elevated C reactive protein, normal ESR. CPK.   REVIEW OF SYSTEMS: Full 14 system review of systems performed and notable only for feeling hot, cold, achy muscles, headaches, numbness weakness, tremor   ALLERGIES: No Known Allergies  HOME MEDICATIONS: Current Outpatient Prescriptions on File Prior to Visit  Medication Sig Dispense Refill  . amitriptyline (ELAVIL) 25 MG tablet Take 2 tablets by mouth once daily at bedtime 60 tablet 0  . aspirin 81 MG tablet Take 81 mg by mouth daily.    . doxepin (SINEQUAN) 50 MG capsule Take one tablet by mouth at bedtime for pain 30 capsule 1  . fluticasone (FLONASE) 50 MCG/ACT nasal spray Place 2 sprays into the nose daily as needed for allergies. Two sprays into each nostril twice a day.    . gabapentin (NEURONTIN) 400  MG capsule Three capsules by mouth twice daily 180 capsule 3  . loratadine (CLARITIN) 10 MG tablet Take 10 mg by mouth daily.    . methocarbamol (ROBAXIN) 500 MG tablet Take 1 tablet (500 mg total) by mouth every 8 (eight) hours as needed for muscle spasms. 30 tablet 0  . metoprolol (LOPRESSOR) 50 MG tablet Take 1 tablet (50 mg total) by mouth 2 (two) times daily. 60 tablet 3  . oxyCODONE-acetaminophen (ROXICET) 5-325 MG per tablet Take 1 tablet every 6 hours as needed for moderate-severe pain 120 tablet 0  . vitamin D, CHOLECALCIFEROL, 400 UNITS tablet Take by mouth daily. Take three  Capsules by mouth to equal 1200mg daily.     No current facility-administered medications on file prior to visit.    PAST MEDICAL HISTORY: Past Medical History  Diagnosis Date  . Myalgia and myositis, unspecified   . Unspecified vitamin D deficiency   . Depressive disorder, not elsewhere classified   . Essential hypertension, benign   . Raynaud's syndrome   . Sciatica   . Enthesopathy of hip region   . Muscle weakness (generalized)   . Other malaise and fatigue   . Other abnormal blood chemistry   . Personal history of arthritis   . Personal history of fall   . Bursitis     PAST SURGICAL HISTORY: Past Surgical History  Procedure Laterality Date  . Abdominal hysterectomy  1985    FAMILY HISTORY: Family History  Problem Relation Age of Onset  . Dementia Mother   . Heart disease Mother   . Hypertension Mother   .

## 2014-09-07 NOTE — Procedures (Signed)
   NCS (NERVE CONDUCTION STUDY) WITH EMG (ELECTROMYOGRAPHY) REPORT   STUDY DATE: September 07 2014 PATIENT NAME: Melinda [name] DOB: 04/01/1951 MRN: [id number]    TECHNOLOGIST: Kaylyn Lim ELECTROMYOGRAPHER: Levert Feinstein M.D.  CLINICAL INFORMATION:  64 years old female, with history of diffuse body achy pain, bilateral lower extremity paresthesia  FINDINGS: NERVE CONDUCTION STUDY: Bilateral sural sensory responses were absent. Left median, ulnar, radial sensory responses were normal.  Bilateral peroneal to EDB, and tibial motor responses showed decreased C map amplitude, with normal distal latency, conduction velocity.  Left median, ulnar motor responses were normal.  NEEDLE ELECTROMYOGRAPHY:  Selective needle examination was performed at left lower extremity muscles, left lumbosacral paraspinal muscles, right upper extremity muscles, right cervical paraspinal muscles.  Selected needle examination of left tibialis anterior, tibialis posterior, peroneal longus, vastus lateralis was normal.  There was no spontaneous activity at left lumbar sacral paraspinal muscles, left L4-5 S1  Selected needle examination of right upper extremity muscles, right pronator teres, extensor digitorum communis, biceps triceps deltoid was normal. There was no spontaneous activity at right cervical paraspinal muscles, right C5, 6, 7.   IMPRESSION:  This is a mild abnormal study. There is electrodiagnostic evidence of mild length dependent axonal peripheral neuropathy. There was no evidence of left lumbar sacral radiculopathy, right cervical radiculopathy.  INTERPRETING PHYSICIAN:   Levert Feinstein M.D. Ph.D. [facility] [address], [address] [place], [place] [address] [phone]

## 2014-09-08 ENCOUNTER — Telehealth: Payer: Self-pay | Admitting: Neurology

## 2014-09-08 DIAGNOSIS — R202 Paresthesia of skin: Secondary | ICD-10-CM | POA: Insufficient documentation

## 2014-09-08 NOTE — Telephone Encounter (Signed)
EMG/NCS 

## 2014-09-11 ENCOUNTER — Other Ambulatory Visit: Payer: Self-pay | Admitting: Internal Medicine

## 2014-09-15 ENCOUNTER — Other Ambulatory Visit: Payer: Self-pay | Admitting: *Deleted

## 2014-09-15 DIAGNOSIS — IMO0001 Reserved for inherently not codable concepts without codable children: Secondary | ICD-10-CM

## 2014-09-15 DIAGNOSIS — M4800 Spinal stenosis, site unspecified: Secondary | ICD-10-CM

## 2014-09-15 MED ORDER — DOXEPIN HCL 50 MG PO CAPS
ORAL_CAPSULE | ORAL | Status: DC
Start: 2014-09-15 — End: 2014-11-03

## 2014-09-15 MED ORDER — OXYCODONE-ACETAMINOPHEN 5-325 MG PO TABS: ORAL_TABLET | ORAL | Status: DC

## 2014-09-15 NOTE — Telephone Encounter (Signed)
Patient Requested and will pick up 

## 2014-10-04 ENCOUNTER — Ambulatory Visit: Payer: Commercial Managed Care - HMO | Admitting: Internal Medicine

## 2014-10-12 ENCOUNTER — Telehealth: Payer: Self-pay | Admitting: Internal Medicine

## 2014-10-12 NOTE — Telephone Encounter (Signed)
Called patient today to inquire about scheduling her Mammogram. Tried both numbers and got a message that the # was not a working number please try again later for both phone #'s on file.

## 2014-10-26 ENCOUNTER — Other Ambulatory Visit: Payer: Self-pay | Admitting: Internal Medicine

## 2014-10-29 ENCOUNTER — Other Ambulatory Visit: Payer: Self-pay | Admitting: Internal Medicine

## 2014-11-03 ENCOUNTER — Encounter: Payer: Self-pay | Admitting: Internal Medicine

## 2014-11-03 ENCOUNTER — Ambulatory Visit (INDEPENDENT_AMBULATORY_CARE_PROVIDER_SITE_OTHER): Payer: Commercial Managed Care - HMO | Admitting: Internal Medicine

## 2014-11-03 VITALS — BP 128/80 | HR 66 | Temp 97.9°F | Resp 20 | Ht 64.0 in | Wt 224.8 lb

## 2014-11-03 DIAGNOSIS — J301 Allergic rhinitis due to pollen: Secondary | ICD-10-CM | POA: Diagnosis not present

## 2014-11-03 DIAGNOSIS — IMO0001 Reserved for inherently not codable concepts without codable children: Secondary | ICD-10-CM

## 2014-11-03 DIAGNOSIS — M4800 Spinal stenosis, site unspecified: Secondary | ICD-10-CM | POA: Diagnosis not present

## 2014-11-03 DIAGNOSIS — M609 Myositis, unspecified: Secondary | ICD-10-CM | POA: Diagnosis not present

## 2014-11-03 DIAGNOSIS — I10 Essential (primary) hypertension: Secondary | ICD-10-CM | POA: Diagnosis not present

## 2014-11-03 DIAGNOSIS — M791 Myalgia: Secondary | ICD-10-CM

## 2014-11-03 DIAGNOSIS — J309 Allergic rhinitis, unspecified: Secondary | ICD-10-CM | POA: Insufficient documentation

## 2014-11-03 MED ORDER — GABAPENTIN 400 MG PO CAPS: ORAL_CAPSULE | ORAL | Status: DC

## 2014-11-03 MED ORDER — FLUTICASONE PROPIONATE 50 MCG/ACT NA SUSP: 2.0000 | Freq: Every day | NASAL | Status: DC | PRN

## 2014-11-03 MED ORDER — LORATADINE 10 MG PO TABS: 10.0000 mg | ORAL_TABLET | Freq: Every day | ORAL | Status: DC

## 2014-11-03 MED ORDER — OXYCODONE-ACETAMINOPHEN 5-325 MG PO TABS: ORAL_TABLET | ORAL | Status: DC

## 2014-11-03 NOTE — Patient Instructions (Signed)
Push fluids and rest  Use saline nasal spray as needed to keep nose moist.  Use flonase as directed   Continue other medications as ordered  Follow up in 3 mos for routine visit

## 2014-11-03 NOTE — Progress Notes (Signed)
Patient ID: Melinda [name], female   DOB: [birth year], 63 y.o.   MRN: [id number]    Location:    [facility]   Place of Service:   OFFICE   Chief Complaint  Patient presents with  . Acute Visit    Patient c/o swollen feet    HPI:  64 yo female seen today for swollen feet. Hx arthritis and HTN. She has lost 11 lbs since March 2016. She had swelling in her feet while on 15 day cruise to [place]. When she returned home on Apr 27th and elevated legs. Swelling much improved several days.  She also c/o dry mouth and nose x few months. Takes claritin and uses flonase year round but has been out of med x 2 mos. Needs RF of meds. She has tried saline spray in her nose with min relief. Eyes feel dry also. She is concerned she is not making saliva. She drinks a lot of water.  She also needs RF on pain med  Past Medical History  Diagnosis Date  . Myalgia and myositis, unspecified   . Unspecified vitamin D deficiency   . Depressive disorder, not elsewhere classified   . Essential hypertension, benign   . Raynaud's syndrome   . Sciatica   . Enthesopathy of hip region   . Muscle weakness (generalized)   . Other malaise and fatigue   . Other abnormal blood chemistry   . Personal history of arthritis   . Personal history of fall   . Bursitis     Past Surgical History  Procedure Laterality Date  . Abdominal hysterectomy  1985    Patient Care Team: Kirt Boys, DO as PCP - General (Internal Medicine)  History   Social History  . Marital Status: Single    Spouse Name: N/A  . Number of Children: 3  . Years of Education: 13   Occupational History  .      retired   Social History Main Topics  . Smoking status: Never Smoker   . Smokeless tobacco: Never Used  . Alcohol Use: No  . Drug Use: No  . Sexual Activity: Not on file   Other Topics Concern  . Not on file   Social History Narrative   Patient lives with her grandson. Patient is retired.   Education some college.     Right handed.    Caffeine two cups daily.     reports that she has never smoked. She has never used smokeless tobacco. She reports that she does not drink alcohol or use illicit drugs.  No Known Allergies  Medications: Patient's Medications  New Prescriptions   No medications on file  Previous Medications   AMITRIPTYLINE (ELAVIL) 25 MG TABLET    TAKE TWO TABLETS BY MOUTH AT BEDTIME   ASPIRIN 81 MG TABLET    Take 81 mg by mouth daily.   DOXEPIN (SINEQUAN) 50 MG CAPSULE    TAKE ONE CAPSULE BY MOUTH AT BEDTIME FOR  PAIN   FLUTICASONE (FLONASE) 50 MCG/ACT NASAL SPRAY    Place 2 sprays into the nose daily as needed for allergies. Two sprays into each nostril twice a day.   GABAPENTIN (NEURONTIN) 400 MG CAPSULE    Three capsules by mouth twice daily   LORATADINE (CLARITIN) 10 MG TABLET    Take 10 mg by mouth daily.   METHOCARBAMOL (ROBAXIN) 500 MG TABLET    TAKE ONE TABLET BY MOUTH EVERY 8 HOURS AS NEEDED FOR MUSCLE SPASMS   METOPROLOL (  LOPRESSOR) 50 MG TABLET    Take 1 tablet (50 mg total) by mouth 2 (two) times daily.   OXYCODONE-ACETAMINOPHEN (ROXICET) 5-325 MG PER TABLET    Take 1 tablet every 6 hours as needed for moderate-severe pain   VITAMIN D, CHOLECALCIFEROL, 400 UNITS TABLET    Take by mouth daily. Take three  Capsules by mouth to equal 1200mg  daily.  Modified Medications   No medications on file  Discontinued Medications   DOXEPIN (SINEQUAN) 50 MG CAPSULE    Take one tablet by mouth at bedtime for pain    Review of Systems  HENT: Positive for postnasal drip.   Cardiovascular: Positive for leg swelling.  Musculoskeletal: Positive for arthralgias.  All other systems reviewed and are negative.   Filed Vitals:   11/03/14 1000  BP: 128/80  Pulse: 66  Temp: 97.9 F (36.6 C)  TempSrc: Oral  Resp: 20  Height: 5\' 4"  (1.626 m)  Weight: 224 lb 12.8 oz (101.969 kg)   Body mass index is 38.57 kg/(m^2).  Physical Exam  Constitutional: She is oriented to person, place, and  time. She appears well-developed and well-nourished. No distress.  HENT:  Ears:  Nose: Mucosal edema present. No rhinorrhea.  No foreign bodies. Right sinus exhibits no maxillary sinus tenderness and no frontal sinus tenderness. Left sinus exhibits no maxillary sinus tenderness and no frontal sinus tenderness.  Mouth/Throat: Mucous membranes are normal. Posterior oropharyngeal erythema (with cobblestoning appearance) present.  Eyes: Pupils are equal, round, and reactive to light. Right eye exhibits no discharge. Left eye exhibits no discharge. No scleral icterus.  Neck: Neck supple.  Cardiovascular: Normal rate, regular rhythm, normal heart sounds and intact distal pulses.  Exam reveals no gallop and no friction rub.   No murmur heard. Pulmonary/Chest: Effort normal. No respiratory distress. She has no wheezes. She has no rales.  Lymphadenopathy:    She has no cervical adenopathy.  Neurological: She is alert and oriented to person, place, and time.  Skin: Skin is warm and dry. No rash noted.  Psychiatric: She has a normal mood and affect. Her behavior is normal. Thought content normal.     Labs reviewed: No visits with results within 3 Month(s) from this visit. Latest known visit with results is:  Office Visit on 07/14/2014  Component Date Value Ref Range Status  . Glucose 07/14/2014 84  65 - 99 mg/dL Final   Comment: Specimen received in contact with cells. No visible hemolysis present. However GLUC may be decreased and K increased. Clinical correlation indicated.   . BUN 07/14/2014 10  8 - 27 mg/dL Final  . Creatinine, Ser 07/14/2014 0.84  0.57 - 1.00 mg/dL Final  . GFR calc non Af Amer 07/14/2014 75  >59 mL/min/1.73 Final  . GFR calc Af Amer 07/14/2014 86  >59 mL/min/1.73 Final  . BUN/Creatinine Ratio 07/14/2014 12  11 - 26 Final  . Sodium 07/14/2014 143  134 - 144 mmol/L Final  . Potassium 07/14/2014 4.6  3.5 - 5.2 mmol/L Final  . Chloride 07/14/2014 100  97 - 108 mmol/L  Final  . CO2 07/14/2014 22  18 - 29 mmol/L Final  . Calcium 07/14/2014 9.2  8.7 - 10.3 mg/dL Final  . Total Protein 07/14/2014 6.4  6.0 - 8.5 g/dL Final  . Albumin 16/03/9603 3.8  3.6 - 4.8 g/dL Final  . Globulin, Total 07/14/2014 2.6  1.5 - 4.5 g/dL Final  . Albumin/Globulin Ratio 07/14/2014 1.5  1.1 - 2.5 Final  . Total  Bilirubin 07/14/2014 0.9  0.0 - 1.2 mg/dL Final  . Alkaline Phosphatase 07/14/2014 67  39 - 117 IU/L Final  . AST 07/14/2014 9  0 - 40 IU/L Final  . ALT 07/14/2014 9  0 - 32 IU/L Final    No results found.   Assessment/Plan    ICD-9-CM ICD-10-CM   1. Allergic rhinitis due to pollen 477.0 J30.1   2. Spinal stenosis, unspecified spinal region 724.00 M48.00 oxyCODONE-acetaminophen (ROXICET) 5-325 MG per tablet  3. Myalgia and myositis 729.1 M79.1 oxyCODONE-acetaminophen (ROXICET) 5-325 MG per tablet    M60.9   4. Essential hypertension - controlled; cont meds 401.9 I10    --Push fluids and rest  --Use saline nasal spray as needed to keep nose moist.  --Use flonase as directed   --Continue other medications as ordered  --Follow up in 3 mos for routine visit  Melinda [name] S. Ancil Linsey  [facility] [address] [place], [place] [address] [phone] Cell (Monday-Friday 8 AM - 5 PM) [phone] After 5 PM and follow prompts

## 2014-11-14 ENCOUNTER — Other Ambulatory Visit: Payer: Self-pay | Admitting: *Deleted

## 2014-11-14 MED ORDER — FLUTICASONE PROPIONATE 50 MCG/ACT NA SUSP: 2.0000 | Freq: Every day | NASAL | Status: DC | PRN

## 2014-11-14 NOTE — Telephone Encounter (Signed)
Pharmacy requested to be refaxed with directions.

## 2014-11-15 ENCOUNTER — Ambulatory Visit: Payer: Self-pay | Admitting: Internal Medicine

## 2014-11-17 ENCOUNTER — Ambulatory Visit: Payer: Commercial Managed Care - HMO | Admitting: Internal Medicine

## 2014-11-17 ENCOUNTER — Encounter: Payer: Self-pay | Admitting: Internal Medicine

## 2014-11-17 DIAGNOSIS — Z0289 Encounter for other administrative examinations: Secondary | ICD-10-CM

## 2014-12-05 ENCOUNTER — Other Ambulatory Visit: Payer: Self-pay | Admitting: *Deleted

## 2014-12-05 DIAGNOSIS — IMO0001 Reserved for inherently not codable concepts without codable children: Secondary | ICD-10-CM

## 2014-12-05 DIAGNOSIS — M4800 Spinal stenosis, site unspecified: Secondary | ICD-10-CM

## 2014-12-05 MED ORDER — OXYCODONE-ACETAMINOPHEN 5-325 MG PO TABS: ORAL_TABLET | ORAL | Status: DC

## 2014-12-07 ENCOUNTER — Telehealth: Payer: Self-pay

## 2014-12-07 DIAGNOSIS — IMO0001 Reserved for inherently not codable concepts without codable children: Secondary | ICD-10-CM

## 2014-12-07 DIAGNOSIS — M4800 Spinal stenosis, site unspecified: Secondary | ICD-10-CM

## 2014-12-07 MED ORDER — OXYCODONE-ACETAMINOPHEN 5-325 MG PO TABS: ORAL_TABLET | ORAL | Status: DC

## 2014-12-07 NOTE — Telephone Encounter (Signed)
Patient called she lost her Rx for Oxycodone, (12/05/14) she called the company [address] [employer] that [address] in, they didn't find it, she has looked and can't find it. Will route message to Dr. Montez Morita

## 2014-12-07 NOTE — Telephone Encounter (Signed)
Patient aware to pick rx up after 9 am tomorrow Friday 12/08/14

## 2014-12-07 NOTE — Telephone Encounter (Signed)
Ok to reprint for ME to sign in the AM

## 2014-12-12 ENCOUNTER — Telehealth: Payer: Self-pay | Admitting: *Deleted

## 2014-12-12 NOTE — Telephone Encounter (Signed)
Patient called regarding her pain contract, she stated that she was not aware that she had a contract to sign before she could pick up her medications. I informed her that I would have to speak with Dr. Montez Morita before I could do anything. After speaking with Dr. Montez Morita : she stated that she could have the script today if she promised to in on Wednesday to sign the contract.

## 2014-12-20 ENCOUNTER — Encounter: Payer: Self-pay | Admitting: Internal Medicine

## 2014-12-20 ENCOUNTER — Ambulatory Visit (INDEPENDENT_AMBULATORY_CARE_PROVIDER_SITE_OTHER): Payer: Commercial Managed Care - HMO | Admitting: Internal Medicine

## 2014-12-20 VITALS — BP 120/78 | HR 71 | Temp 97.8°F | Resp 20 | Ht 64.0 in | Wt 221.4 lb

## 2014-12-20 DIAGNOSIS — M4800 Spinal stenosis, site unspecified: Secondary | ICD-10-CM | POA: Diagnosis not present

## 2014-12-20 DIAGNOSIS — G629 Polyneuropathy, unspecified: Secondary | ICD-10-CM

## 2014-12-20 DIAGNOSIS — R062 Wheezing: Secondary | ICD-10-CM | POA: Diagnosis not present

## 2014-12-20 DIAGNOSIS — E669 Obesity, unspecified: Secondary | ICD-10-CM | POA: Diagnosis not present

## 2014-12-20 DIAGNOSIS — J301 Allergic rhinitis due to pollen: Secondary | ICD-10-CM | POA: Diagnosis not present

## 2014-12-20 MED ORDER — DOXEPIN HCL 50 MG PO CAPS: 100.0000 mg | ORAL_CAPSULE | Freq: Every day | ORAL | Status: DC

## 2014-12-20 MED ORDER — ALBUTEROL SULFATE HFA 108 (90 BASE) MCG/ACT IN AERS: 2.0000 | INHALATION_SPRAY | Freq: Four times a day (QID) | RESPIRATORY_TRACT | Status: DC | PRN

## 2014-12-20 NOTE — Patient Instructions (Signed)
Continue current medications as ordered  Referral to pain management placed  Will call with lab result  Follow up as scheduled in August for routine visit

## 2014-12-20 NOTE — Progress Notes (Signed)
Patient ID: Melinda [name], female   DOB: 05-20-1951, 64 y.o.   MRN: [id number]    Location:    [facility]   Place of Service:   OFFICE  Chief Complaint  Patient presents with  . Medical Management of Chronic Issues    3 month follow-up    HPI:  64 yo female seen today to discuss neuropathy. She would like A1c done due to worsening neuropathy in fingers and b/l foot. Burning in ankle b/l. She has a hx spinal stenosis and gait abnormality. She does not see pain mx due to financial constraints  She also has increased daily wheezing x several weeks. No cough. She has occasional SOB. No sick contacts. She has a hx allergic rhinitis.  Past Medical History  Diagnosis Date  . Myalgia and myositis, unspecified   . Unspecified vitamin D deficiency   . Depressive disorder, not elsewhere classified   . Essential hypertension, benign   . Raynaud's syndrome   . Sciatica   . Enthesopathy of hip region   . Muscle weakness (generalized)   . Other malaise and fatigue   . Other abnormal blood chemistry   . Personal history of arthritis   . Personal history of fall   . Bursitis     Past Surgical History  Procedure Laterality Date  . Abdominal hysterectomy  1985    Patient Care Team: Kirt Boys, DO as PCP - General (Internal Medicine)  History   Social History  . Marital Status: Single    Spouse Name: N/A  . Number of Children: 3  . Years of Education: 13   Occupational History  .      retired   Social History Main Topics  . Smoking status: Never Smoker   . Smokeless tobacco: Never Used  . Alcohol Use: No  . Drug Use: No  . Sexual Activity: Not on file   Other Topics Concern  . Not on file   Social History Narrative   Patient lives with her grandson. Patient is retired.   Education some college.   Right handed.    Caffeine two cups daily.     reports that she has never smoked. She has never used smokeless tobacco. She reports that she does not drink alcohol or use  illicit drugs.  No Known Allergies  Medications: Patient's Medications  New Prescriptions   No medications on file  Previous Medications   AMITRIPTYLINE (ELAVIL) 25 MG TABLET    TAKE TWO TABLETS BY MOUTH AT BEDTIME   ASPIRIN 81 MG TABLET    Take 81 mg by mouth daily.   DOXEPIN (SINEQUAN) 50 MG CAPSULE    TAKE ONE CAPSULE BY MOUTH AT BEDTIME FOR  PAIN   FLUTICASONE (FLONASE) 50 MCG/ACT NASAL SPRAY    Place 2 sprays into both nostrils daily as needed for allergies or rhinitis.   GABAPENTIN (NEURONTIN) 400 MG CAPSULE    Three capsules by mouth twice daily   LORATADINE (CLARITIN) 10 MG TABLET    Take 1 tablet (10 mg total) by mouth daily.   METHOCARBAMOL (ROBAXIN) 500 MG TABLET    TAKE ONE TABLET BY MOUTH EVERY 8 HOURS AS NEEDED FOR MUSCLE SPASMS   METOPROLOL (LOPRESSOR) 50 MG TABLET    Take 1 tablet (50 mg total) by mouth 2 (two) times daily.   OXYCODONE-ACETAMINOPHEN (ROXICET) 5-325 MG PER TABLET    Take 1 tablet every 6 hours as needed for moderate-severe pain   VITAMIN D, CHOLECALCIFEROL, 400 UNITS TABLET  Take by mouth daily. Take three  Capsules by mouth to equal 1200mg  daily.  Modified Medications   No medications on file  Discontinued Medications   No medications on file    Review of Systems  Respiratory: Positive for shortness of breath and wheezing. Negative for cough.   Musculoskeletal: Positive for back pain, gait problem and neck pain.  Neurological: Positive for numbness. Negative for seizures.    Filed Vitals:   12/20/14 1025  BP: 120/78  Pulse: 71  Temp: 97.8 F (36.6 C)  TempSrc: Oral  Resp: 20  Height: 5\' 4"  (1.626 m)  Weight: 221 lb 6.4 oz (100.426 kg)  SpO2: 94%   Body mass index is 37.98 kg/(m^2).  Physical Exam  Constitutional: She appears well-developed and well-nourished.  Cardiovascular: Normal rate, regular rhythm and intact distal pulses.  Exam reveals no gallop and no friction rub.   No murmur heard. No LE edema b/l. No calf TTP    Pulmonary/Chest: Effort normal. No respiratory distress. She has wheezes (with cough and prolonged expiratory phase). She has no rales.  Musculoskeletal: She exhibits edema and tenderness.     Labs reviewed: No visits with results within 3 Month(s) from this visit. Latest known visit with results is:  Office Visit on 07/14/2014  Component Date Value Ref Range Status  . Glucose 07/14/2014 84  65 - 99 mg/dL Final   Comment: Specimen received in contact with cells. No visible hemolysis present. However GLUC may be decreased and K increased. Clinical correlation indicated.   . BUN 07/14/2014 10  8 - 27 mg/dL Final  . Creatinine, Ser 07/14/2014 0.84  0.57 - 1.00 mg/dL Final  . GFR calc non Af Amer 07/14/2014 75  >59 mL/min/1.73 Final  . GFR calc Af Amer 07/14/2014 86  >59 mL/min/1.73 Final  . BUN/Creatinine Ratio 07/14/2014 12  11 - 26 Final  . Sodium 07/14/2014 143  134 - 144 mmol/L Final  . Potassium 07/14/2014 4.6  3.5 - 5.2 mmol/L Final  . Chloride 07/14/2014 100  97 - 108 mmol/L Final  . CO2 07/14/2014 22  18 - 29 mmol/L Final  . Calcium 07/14/2014 9.2  8.7 - 10.3 mg/dL Final  . Total Protein 07/14/2014 6.4  6.0 - 8.5 g/dL Final  . Albumin 69/62/9528 3.8  3.6 - 4.8 g/dL Final  . Globulin, Total 07/14/2014 2.6  1.5 - 4.5 g/dL Final  . Albumin/Globulin Ratio 07/14/2014 1.5  1.1 - 2.5 Final  . Total Bilirubin 07/14/2014 0.9  0.0 - 1.2 mg/dL Final  . Alkaline Phosphatase 07/14/2014 67  39 - 117 IU/L Final  . AST 07/14/2014 9  0 - 40 IU/L Final  . ALT 07/14/2014 9  0 - 32 IU/L Final    No results found.   Assessment/Plan   ICD-9-CM ICD-10-CM   1. Peripheral neuropathy - worsening 356.9 G62.9 Hemoglobin A1c     Ambulatory referral to Pain Clinic  2. Spinal stenosis, unspecified spinal region 724.00 M48.00 Ambulatory referral to Pain Clinic  3. Wheezing probably due to #4 786.07 R06.2 albuterol (PROVENTIL HFA;VENTOLIN HFA) 108 (90 BASE) MCG/ACT inhaler  4. Allergic rhinitis  due to pollen 477.0 J30.1   5. Obesity (BMI 30-39.9) 278.00 E66.9 Hemoglobin A1c   --increase doxepin to 2 caps qhs for neuropathy  --pain mx referral  --cont other meds as ordered  --keep appt as scheduled in Aug 2016  Bobbiejo Ishikawa S. [facility] [phone]  [address] [place], [place] [address] [phone] Cell (Monday-Friday 8 AM - 5 PM) [phone] After 5 PM and follow prompts

## 2014-12-21 LAB — HEMOGLOBIN A1C
Est. average glucose Bld gHb Est-mCnc: 120 mg/dL
Hgb A1c MFr Bld: 5.8 % — ABNORMAL HIGH (ref 4.8–5.6)

## 2015-01-05 ENCOUNTER — Other Ambulatory Visit: Payer: Self-pay | Admitting: *Deleted

## 2015-01-05 DIAGNOSIS — IMO0001 Reserved for inherently not codable concepts without codable children: Secondary | ICD-10-CM

## 2015-01-05 DIAGNOSIS — M4800 Spinal stenosis, site unspecified: Secondary | ICD-10-CM

## 2015-01-05 MED ORDER — OXYCODONE-ACETAMINOPHEN 5-325 MG PO TABS: ORAL_TABLET | ORAL | Status: DC

## 2015-01-10 ENCOUNTER — Telehealth: Payer: Self-pay | Admitting: *Deleted

## 2015-01-10 NOTE — Telephone Encounter (Signed)
Noted. Pt had MRI of cervical, thoracic and lumbar spine in 2014. Believe she would benefit from pain mx and/or Ortho vs Neurology eval if her pain is so severe. recommend she gets c-spine xray as ordered. MRI T spine was neg in 2014 and L spine showed bulging discs and arthritic changes. Continue current meds as ordered and f/u as scheduled

## 2015-01-10 NOTE — Telephone Encounter (Signed)
Dr. Glade Lloyd had ordered a Cervical Spine Complete 08/29/2014 due to Neck Pain. Patient never went to appointment. When Nicole Cella called to follow up patient stated that she wants an X-ray that will look at the top of neck down the back to the top of her buttock due to pain. Patient stating the she is having pain from the top of her buttock up the middle of the back into her neck. States it makes it hard to sit down. Please Advise.

## 2015-01-11 NOTE — Telephone Encounter (Signed)
LMOM to return call.

## 2015-01-12 ENCOUNTER — Telehealth: Payer: Self-pay | Admitting: *Deleted

## 2015-01-12 NOTE — Telephone Encounter (Signed)
Patient called because she had to take her pain medication rx with her out of state to [place] because it was too early to fill here before she left and the [facility] in [place] doesn't want to fill so patient wants me to call them. I called [facility] in [place] #[phone] and spoke with [place] and confirmed the date of the Rx and the Provider that signed and oked it. Pharmacist will refill.

## 2015-01-12 NOTE — Telephone Encounter (Signed)
Patient is in [place] out of town and will call back when returns.

## 2015-01-25 ENCOUNTER — Other Ambulatory Visit: Payer: Self-pay | Admitting: Internal Medicine

## 2015-02-07 ENCOUNTER — Ambulatory Visit: Payer: Commercial Managed Care - HMO | Admitting: Internal Medicine

## 2015-02-12 ENCOUNTER — Other Ambulatory Visit: Payer: Self-pay | Admitting: *Deleted

## 2015-02-12 DIAGNOSIS — IMO0001 Reserved for inherently not codable concepts without codable children: Secondary | ICD-10-CM

## 2015-02-12 DIAGNOSIS — M4800 Spinal stenosis, site unspecified: Secondary | ICD-10-CM

## 2015-02-12 MED ORDER — OXYCODONE-ACETAMINOPHEN 5-325 MG PO TABS: ORAL_TABLET | ORAL | Status: DC

## 2015-02-12 NOTE — Telephone Encounter (Signed)
Patient requested and will pick up 

## 2015-02-13 ENCOUNTER — Telehealth: Payer: Self-pay | Admitting: *Deleted

## 2015-02-13 NOTE — Telephone Encounter (Signed)
Patient walked into office and picked up her Narcotic Rx but also wanted to know if there was other medications that can be taken in place of these medicines due to cost: Pro-Air Loratadine Flonase Patient is now using [employer] and would like something cheaper. I advised the patient to call her insurance company and see if they have comparable medications to these that they do cover and to bring to her appointment on Friday with Dr. Montez Morita.  She agreed and will call today.

## 2015-02-16 ENCOUNTER — Ambulatory Visit (INDEPENDENT_AMBULATORY_CARE_PROVIDER_SITE_OTHER): Payer: Commercial Managed Care - HMO | Admitting: Internal Medicine

## 2015-02-16 ENCOUNTER — Encounter: Payer: Self-pay | Admitting: Internal Medicine

## 2015-02-16 VITALS — BP 140/82 | HR 90 | Temp 97.7°F | Resp 20 | Ht 64.0 in | Wt 221.0 lb

## 2015-02-16 DIAGNOSIS — M4800 Spinal stenosis, site unspecified: Secondary | ICD-10-CM

## 2015-02-16 DIAGNOSIS — J301 Allergic rhinitis due to pollen: Secondary | ICD-10-CM

## 2015-02-16 DIAGNOSIS — G629 Polyneuropathy, unspecified: Secondary | ICD-10-CM | POA: Diagnosis not present

## 2015-02-16 DIAGNOSIS — I10 Essential (primary) hypertension: Secondary | ICD-10-CM | POA: Diagnosis not present

## 2015-02-16 DIAGNOSIS — R7309 Other abnormal glucose: Secondary | ICD-10-CM | POA: Diagnosis not present

## 2015-02-16 DIAGNOSIS — R7303 Prediabetes: Secondary | ICD-10-CM

## 2015-02-16 NOTE — Progress Notes (Signed)
Patient ID: Melinda [name], female   DOB: [birth year], 64 y.o.   MRN: [id number]    Location:    [facility]   Place of Service:  OFFICE   Chief Complaint  Patient presents with  . Medical Management of Chronic Issues    3 month follow-up    HPI:  64 yo female seen today for f/u. She has difficulty ambulating and her wheeled walker "got out from under me" recently and she "went down".  No head trauma.   She takes claritin daily for post nasal drip. She still gets thick yellow mucous "clogged" in her throat. She also takes flonase and prn HFA. Wheezing returns within 2 hrs of using HFA. She has never tried singulair.  Peripheral neuropathy/spinal stenosis - improved. Still has a little tingling. Taking gabapentin, percocet, doxepin and methocarbamol. She also takes celebrex  BP stable on lopressor    Past Medical History  Diagnosis Date  . Myalgia and myositis, unspecified   . Unspecified vitamin D deficiency   . Depressive disorder, not elsewhere classified   . Essential hypertension, benign   . Raynaud's syndrome   . Sciatica   . Enthesopathy of hip region   . Muscle weakness (generalized)   . Other malaise and fatigue   . Other abnormal blood chemistry   . Personal history of arthritis   . Personal history of fall   . Bursitis     Past Surgical History  Procedure Laterality Date  . Abdominal hysterectomy  1985    Patient Care Team: Kirt Boys, DO as PCP - General (Internal Medicine)  Social History   Social History  . Marital Status: Single    Spouse Name: N/A  . Number of Children: 3  . Years of Education: 13   Occupational History  .      retired   Social History Main Topics  . Smoking status: Never Smoker   . Smokeless tobacco: Never Used  . Alcohol Use: No  . Drug Use: No  . Sexual Activity: Not on file   Other Topics Concern  . Not on file   Social History Narrative   Patient lives with her grandson. Patient is retired.   Education some college.    Right handed.    Caffeine two cups daily.     reports that she has never smoked. She has never used smokeless tobacco. She reports that she does not drink alcohol or use illicit drugs.  No Known Allergies  Medications: Patient's Medications  New Prescriptions   No medications on file  Previous Medications   ALBUTEROL (PROVENTIL HFA;VENTOLIN HFA) 108 (90 BASE) MCG/ACT INHALER    Inhale 2 puffs into the lungs every 6 (six) hours as needed for wheezing or shortness of breath.   AMITRIPTYLINE (ELAVIL) 25 MG TABLET    TAKE TWO TABLETS BY MOUTH AT BEDTIME   ASPIRIN 81 MG TABLET    Take 81 mg by mouth daily.   DOXEPIN (SINEQUAN) 50 MG CAPSULE    Take 2 capsules (100 mg total) by mouth at bedtime.   FLUTICASONE (FLONASE) 50 MCG/ACT NASAL SPRAY    Place 2 sprays into both nostrils daily as needed for allergies or rhinitis.   GABAPENTIN (NEURONTIN) 400 MG CAPSULE    Three capsules by mouth twice daily   LORATADINE (CLARITIN) 10 MG TABLET    Take 1 tablet (10 mg total) by mouth daily.   METHOCARBAMOL (ROBAXIN) 500 MG TABLET    TAKE ONE TABLET BY MOUTH EVERY  8 HOURS AS NEEDED FOR MUSCLE SPASMS   METOPROLOL (LOPRESSOR) 50 MG TABLET    TAKE ONE TABLET BY MOUTH TWICE DAILY   OXYCODONE-ACETAMINOPHEN (ROXICET) 5-325 MG PER TABLET    Take 1 tablet every 6 hours as needed for moderate-severe pain   VITAMIN D, CHOLECALCIFEROL, 400 UNITS TABLET    Take by mouth daily. Take three  Capsules by mouth to equal 1200mg  daily.  Modified Medications   No medications on file  Discontinued Medications   No medications on file    Review of Systems  Constitutional: Positive for fatigue. Negative for fever, chills, diaphoresis, activity change and appetite change.  HENT: Negative for ear pain and sore throat.   Eyes: Negative for visual disturbance.  Respiratory: Negative for cough, chest tightness and shortness of breath.   Cardiovascular: Negative for chest pain, palpitations and leg swelling.    Gastrointestinal: Negative for nausea, vomiting, abdominal pain, diarrhea, constipation and blood in stool.  Genitourinary: Negative for dysuria.  Musculoskeletal: Positive for back pain, joint swelling, arthralgias and gait problem.  Neurological: Negative for dizziness, tremors, numbness and headaches.  Psychiatric/Behavioral: Negative for sleep disturbance. The patient is not nervous/anxious.     Filed Vitals:   02/16/15 1421  BP: 140/82  Pulse: 90  Temp: 97.7 F (36.5 C)  TempSrc: Oral  Resp: 20  Height: 5\' 4"  (1.626 m)  Weight: 221 lb (100.245 kg)  SpO2: 98%   Body mass index is 37.92 kg/(m^2).  Physical Exam  Constitutional: She is oriented to person, place, and time. She appears well-developed and well-nourished. No distress.  Looks well in NAD  HENT:  Mouth/Throat: Oropharynx is clear and moist.  Cobblestoning appearance to oropharynx. No redness  Eyes: Pupils are equal, round, and reactive to light. No scleral icterus.  Neck: Neck supple. Carotid bruit is not present. No tracheal deviation present. No thyromegaly present.  Cardiovascular: Normal rate, regular rhythm, normal heart sounds and intact distal pulses.  Exam reveals no gallop and no friction rub.   No murmur heard. No LE edema b/l. no calf TTP.   Pulmonary/Chest: Effort normal. No stridor. No respiratory distress. She has no wheezes. She has no rales.  Abdominal: Soft. Bowel sounds are normal. She exhibits no distension and no mass. There is no hepatomegaly. There is no tenderness. There is no rebound and no guarding.  Musculoskeletal: Normal range of motion. She exhibits edema and tenderness.  Uses rolling walker. Antalgic gait  Lymphadenopathy:    She has no cervical adenopathy.  Neurological: She is alert and oriented to person, place, and time.  Skin: Skin is warm and dry. No rash noted.  Psychiatric: She has a normal mood and affect. Her behavior is normal. Thought content normal.     Labs  reviewed: Office Visit on 12/20/2014  Component Date Value Ref Range Status  . Hgb A1c MFr Bld 12/20/2014 5.8* 4.8 - 5.6 % Final   Comment:          Pre-diabetes: 5.7 - 6.4          Diabetes: >6.4          Glycemic control for adults with diabetes: <7.0   . Est. average glucose Bld gHb Est-m* 12/20/2014 120   Final    No results found.   Assessment/Plan   ICD-9-CM ICD-10-CM   1. Peripheral neuropathy - stable 356.9 G62.9 CMP  2. Allergic rhinitis due to pollen - stable 477.0 J30.1   3. Essential hypertension - controlled 401.9 I10 CMP  4. Spinal stenosis, unspecified spinal region - pain controlled 724.00 M48.00   5. Prediabetes - stable 790.29 R73.09 Hemoglobin A1c   Continue current medications as ordered  Follow up in 3 mos for routine visit  Hayleigh Bawa S. Ancil Linsey  [facility] [address] [place], [place] [address] [phone] Cell (Monday-Friday 8 AM - 5 PM) [phone] After 5 PM and follow prompts

## 2015-02-16 NOTE — Patient Instructions (Signed)
Continue current medications as ordered  Follow up in 3 mos for routine visit  

## 2015-02-17 LAB — COMPREHENSIVE METABOLIC PANEL
ALT: 7 IU/L (ref 0–32)
AST: 12 IU/L (ref 0–40)
Albumin/Globulin Ratio: 1.7 (ref 1.1–2.5)
Albumin: 4 g/dL (ref 3.6–4.8)
Alkaline Phosphatase: 76 IU/L (ref 39–117)
BUN/Creatinine Ratio: 16 (ref 11–26)
BUN: 14 mg/dL (ref 8–27)
Bilirubin Total: 0.5 mg/dL (ref 0.0–1.2)
CO2: 24 mmol/L (ref 18–29)
Calcium: 9.1 mg/dL (ref 8.7–10.3)
Chloride: 102 mmol/L (ref 97–108)
Creatinine, Ser: 0.9 mg/dL (ref 0.57–1.00)
GFR calc Af Amer: 79 mL/min/{1.73_m2} (ref >59)
GFR calc non Af Amer: 68 mL/min/{1.73_m2} (ref >59)
Globulin, Total: 2.4 g/dL (ref 1.5–4.5)
Glucose: 82 mg/dL (ref 65–99)
Potassium: 4.6 mmol/L (ref 3.5–5.2)
Sodium: 141 mmol/L (ref 134–144)
Total Protein: 6.4 g/dL (ref 6.0–8.5)

## 2015-02-17 LAB — HEMOGLOBIN A1C
Est. average glucose Bld gHb Est-mCnc: 117 mg/dL
Hgb A1c MFr Bld: 5.7 % — ABNORMAL HIGH (ref 4.8–5.6)

## 2015-02-26 ENCOUNTER — Other Ambulatory Visit: Payer: Self-pay | Admitting: *Deleted

## 2015-02-26 DIAGNOSIS — M4800 Spinal stenosis, site unspecified: Secondary | ICD-10-CM

## 2015-02-26 DIAGNOSIS — G629 Polyneuropathy, unspecified: Secondary | ICD-10-CM

## 2015-02-26 MED ORDER — GABAPENTIN 400 MG PO CAPS: ORAL_CAPSULE | ORAL | Status: DC

## 2015-02-26 MED ORDER — CELECOXIB 100 MG PO CAPS: ORAL_CAPSULE | ORAL | Status: DC

## 2015-02-26 MED ORDER — DOXEPIN HCL 50 MG PO CAPS: 100.0000 mg | ORAL_CAPSULE | Freq: Every day | ORAL | Status: DC

## 2015-02-26 MED ORDER — AMITRIPTYLINE HCL 25 MG PO TABS: ORAL_TABLET | ORAL | Status: DC

## 2015-02-26 MED ORDER — METOPROLOL TARTRATE 50 MG PO TABS: ORAL_TABLET | ORAL | Status: DC

## 2015-02-26 MED ORDER — METHOCARBAMOL 500 MG PO TABS: ORAL_TABLET | ORAL | Status: DC

## 2015-02-26 NOTE — Telephone Encounter (Signed)
[facility]. Patient requested 90 day supply because it is cheaper. Per Aram Beecham fax to [employer]. Faxed and patient notified.

## 2015-02-28 ENCOUNTER — Other Ambulatory Visit: Payer: Self-pay

## 2015-02-28 MED ORDER — METHOCARBAMOL 500 MG PO TABS: ORAL_TABLET | ORAL | Status: DC

## 2015-03-08 ENCOUNTER — Telehealth: Payer: Self-pay | Admitting: *Deleted

## 2015-03-08 DIAGNOSIS — IMO0001 Reserved for inherently not codable concepts without codable children: Secondary | ICD-10-CM

## 2015-03-08 DIAGNOSIS — M4800 Spinal stenosis, site unspecified: Secondary | ICD-10-CM

## 2015-03-08 NOTE — Telephone Encounter (Signed)
Patient called and stated that she is leaving out of town to [place] to help her daughter tomorrow morning and would like her Oxycodone Rx early. Last filled was 02/12/2015. She stated that she will get it filled in [place].Please Advise.

## 2015-03-08 NOTE — Telephone Encounter (Signed)
I am not sure if the pharmacy will fill a controlled substance Rx written in [place] in [place]. She may want to verify that before leaving the state. Ok to print Rx early

## 2015-03-09 MED ORDER — OXYCODONE-ACETAMINOPHEN 5-325 MG PO TABS: ORAL_TABLET | ORAL | Status: DC

## 2015-03-09 NOTE — Telephone Encounter (Signed)
Patient notified and Rx Printed for pick up

## 2015-04-11 ENCOUNTER — Other Ambulatory Visit: Payer: Self-pay

## 2015-04-11 DIAGNOSIS — IMO0001 Reserved for inherently not codable concepts without codable children: Secondary | ICD-10-CM

## 2015-04-11 DIAGNOSIS — M4800 Spinal stenosis, site unspecified: Secondary | ICD-10-CM

## 2015-04-11 MED ORDER — OXYCODONE-ACETAMINOPHEN 5-325 MG PO TABS: ORAL_TABLET | ORAL | Status: DC

## 2015-04-12 ENCOUNTER — Telehealth: Payer: Self-pay | Admitting: *Deleted

## 2015-04-12 MED ORDER — CELECOXIB 100 MG PO CAPS: ORAL_CAPSULE | ORAL | Status: DC

## 2015-04-12 NOTE — Telephone Encounter (Signed)
Patient brought medication bottles by office requesting Celebrex dosage be changed to twice daily that Dr. Levert Feinstein changed to. She would like for [place] to change it in our system and refill it. Is it ok to change her Celebrex from once daily to twice daily and refill? Please Advise.

## 2015-04-12 NOTE — Telephone Encounter (Signed)
Patient stated that she is going to follow up with Dr. Terrace Arabia on a yearly basis. Wants her Rx faxed to [facility]. Faxed.

## 2015-04-12 NOTE — Telephone Encounter (Signed)
Does she plan to f/u with Dr Terrace Arabia on a yearly basis at least? Ok to increase celebrex to BID 100mg  #60 with 3 RF

## 2015-05-16 ENCOUNTER — Other Ambulatory Visit: Payer: Self-pay | Admitting: *Deleted

## 2015-05-16 DIAGNOSIS — IMO0001 Reserved for inherently not codable concepts without codable children: Secondary | ICD-10-CM

## 2015-05-16 DIAGNOSIS — M4800 Spinal stenosis, site unspecified: Secondary | ICD-10-CM

## 2015-05-16 MED ORDER — OXYCODONE-ACETAMINOPHEN 5-325 MG PO TABS: ORAL_TABLET | ORAL | Status: DC

## 2015-05-16 NOTE — Telephone Encounter (Signed)
Patient requested and will pick up 

## 2015-05-23 ENCOUNTER — Ambulatory Visit (INDEPENDENT_AMBULATORY_CARE_PROVIDER_SITE_OTHER): Payer: Commercial Managed Care - HMO | Admitting: Internal Medicine

## 2015-05-23 ENCOUNTER — Encounter: Payer: Self-pay | Admitting: Internal Medicine

## 2015-05-23 VITALS — BP 122/84 | HR 57 | Temp 98.6°F | Ht 64.0 in | Wt 222.0 lb

## 2015-05-23 DIAGNOSIS — R7303 Prediabetes: Secondary | ICD-10-CM | POA: Diagnosis not present

## 2015-05-23 DIAGNOSIS — Z1211 Encounter for screening for malignant neoplasm of colon: Secondary | ICD-10-CM | POA: Diagnosis not present

## 2015-05-23 DIAGNOSIS — G609 Hereditary and idiopathic neuropathy, unspecified: Secondary | ICD-10-CM

## 2015-05-23 DIAGNOSIS — I10 Essential (primary) hypertension: Secondary | ICD-10-CM | POA: Diagnosis not present

## 2015-05-23 DIAGNOSIS — M4800 Spinal stenosis, site unspecified: Secondary | ICD-10-CM | POA: Diagnosis not present

## 2015-05-23 DIAGNOSIS — Z1239 Encounter for other screening for malignant neoplasm of breast: Secondary | ICD-10-CM | POA: Diagnosis not present

## 2015-05-23 DIAGNOSIS — R296 Repeated falls: Secondary | ICD-10-CM

## 2015-05-23 DIAGNOSIS — IMO0001 Reserved for inherently not codable concepts without codable children: Secondary | ICD-10-CM

## 2015-05-23 DIAGNOSIS — Z23 Encounter for immunization: Secondary | ICD-10-CM | POA: Diagnosis not present

## 2015-05-23 DIAGNOSIS — M791 Myalgia: Secondary | ICD-10-CM | POA: Diagnosis not present

## 2015-05-23 DIAGNOSIS — Z79899 Other long term (current) drug therapy: Secondary | ICD-10-CM

## 2015-05-23 DIAGNOSIS — Z205 Contact with and (suspected) exposure to viral hepatitis: Secondary | ICD-10-CM

## 2015-05-23 DIAGNOSIS — F339 Major depressive disorder, recurrent, unspecified: Secondary | ICD-10-CM | POA: Insufficient documentation

## 2015-05-23 DIAGNOSIS — M609 Myositis, unspecified: Secondary | ICD-10-CM

## 2015-05-23 DIAGNOSIS — F330 Major depressive disorder, recurrent, mild: Secondary | ICD-10-CM

## 2015-05-23 MED ORDER — CITALOPRAM HYDROBROMIDE 10 MG PO TABS: 10.0000 mg | ORAL_TABLET | Freq: Every day | ORAL | Status: DC

## 2015-05-23 MED ORDER — TETANUS-DIPHTH-ACELL PERTUSSIS 5-2.5-18.5 LF-MCG/0.5 IM SUSP: 0.5000 mL | Freq: Once | INTRAMUSCULAR | Status: DC

## 2015-05-23 MED ORDER — ZOSTER VACCINE LIVE 19400 UNT/0.65ML ~~LOC~~ SOLR: 0.6500 mL | Freq: Once | SUBCUTANEOUS | Status: DC

## 2015-05-23 NOTE — Progress Notes (Signed)
Patient ID: Melinda [name], female   DOB: [birth year], 64 y.o.   MRN: [id number]    Location:    [facility]   Place of Service:   OFFICE  Chief Complaint  Patient presents with  . Medical Management of Chronic Issues    3 month follow-up HTN, + fall risk.  . Orders    Mammogram, Hep-C screening (baby boomer). Patient refused colonoscopy at this time  . Immunizations    Flu Vaccine     HPI:  64 yo female seen today for f/u. She fell today while walking outside. She has weakness in her legs. She notes tremors in extremities prior to fall. She sustained several abrasions on her fingers. No head trauma. No LOC. 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 She lives alone and has fallen several times in the last several mos.last PT eval several yrs ago.  FMS/chronic pain/sciatica/neuropathy - pain controlled on percocet,  Robaxin, celebrex, and neurontin. She was followed by neurology in the past. She has intermittent tingling and takes neurontin and doxepin  Depression - she reports severe depression as she is estranged from her family. She plans to spend Thanksgiving alone. Denies SI/HI. Currently takes amitriptyline and has not tried any other meds. She gets parietal HA  HTN - BP controlled on metoprolol  Prediabetes - diet controlled. 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 Myalgia and myositis, unspecified   . Unspecified vitamin D deficiency   . Depressive disorder, not elsewhere classified   . Essential hypertension, benign   . Raynaud's syndrome   . Sciatica   . Enthesopathy of hip region   . Muscle weakness (generalized)   . Other malaise and fatigue   . Other abnormal blood chemistry   . Personal history of arthritis   . Personal history of fall   . Bursitis     Past Surgical History  Procedure Laterality Date  . Abdominal hysterectomy  1985      Patient Care Team: Kirt Boys, DO as PCP - General (Internal Medicine)  Social History   Social History  . Marital Status: Single    Spouse Name: N/A  . Number of Children: 3  . Years of Education: 13   Occupational History  .      retired   Social History Main Topics  . Smoking status: Never Smoker   . Smokeless tobacco: Never Used  . Alcohol Use: No  . Drug Use: No  . Sexual Activity: Not on file   Other Topics Concern  . Not on file   Social History Narrative   Patient lives with her grandson. Patient is retired.   Education some college.   Right handed.    Caffeine two cups daily.     reports that she has never smoked. She has never used smokeless tobacco. She reports that she does not drink alcohol or use illicit drugs.  No Known Allergies  Medications: Patient's Medications  New Prescriptions   No medications on file  Previous Medications   ALBUTEROL (PROVENTIL HFA;VENTOLIN HFA) 108 (90 BASE) MCG/ACT INHALER    Inhale 2 puffs into the lungs every 6 (six) hours as needed for wheezing or shortness of breath.   AMITRIPTYLINE (ELAVIL) 25 MG TABLET    Take two tablets by mouth at bedtime for rest   ASPIRIN 81 MG  TABLET    Take 81 mg by mouth daily.   CELECOXIB (CELEBREX) 100 MG CAPSULE    Take one capsule by mouth twice daily for pains   DOXEPIN (SINEQUAN) 50 MG CAPSULE    Take 2 capsules (100 mg total) by mouth at bedtime.   FLUTICASONE (FLONASE) 50 MCG/ACT NASAL SPRAY    Place 2 sprays into both nostrils daily as needed for allergies or rhinitis.   GABAPENTIN (NEURONTIN) 400 MG CAPSULE    Take three capsules by mouth twice daily for pains   LORATADINE (CLARITIN) 10 MG TABLET    Take 1 tablet (10 mg total) by mouth daily.   METHOCARBAMOL (ROBAXIN) 500 MG TABLET    Take one tablet by mouth every 8 hours as needed for muscle spasms   METOPROLOL (LOPRESSOR) 50 MG TABLET    Take one tablet by mouth twice daily for blood pressure   OXYCODONE-ACETAMINOPHEN  (ROXICET) 5-325 MG TABLET    Take 1 tablet every 6 hours as needed for moderate-severe pain   TDAP (BOOSTRIX) 5-2.5-18.5 LF-MCG/0.5 INJECTION    Inject 0.5 mLs into the muscle once.   VITAMIN D, CHOLECALCIFEROL, 400 UNITS TABLET    Take by mouth daily. Take three  Capsules by mouth to equal 1200mg  daily.   ZOSTER VACCINE LIVE, PF, (ZOSTAVAX) 84166 UNT/0.65ML INJECTION    Inject 0.65 mLs into the skin once.  Modified Medications   No medications on file  Discontinued Medications   No medications on file    Review of Systems  Constitutional: Negative for fever, chills, diaphoresis, activity change, appetite change and fatigue.  HENT: Negative for ear pain and sore throat.   Eyes: Negative for visual disturbance.  Respiratory: Negative for cough, chest tightness and shortness of breath.   Cardiovascular: Negative for chest pain, palpitations and leg swelling.  Gastrointestinal: Negative for nausea, vomiting, abdominal pain, diarrhea, constipation and blood in stool.  Genitourinary: Negative for dysuria.  Musculoskeletal: Positive for myalgias, back pain, joint swelling, arthralgias, gait problem and neck pain.  Neurological: Negative for dizziness, tremors, numbness and headaches.  Psychiatric/Behavioral: Positive for dysphoric mood. Negative for suicidal ideas and sleep disturbance. The patient is not nervous/anxious.     Filed Vitals:   05/23/15 1334  BP: 122/84  Pulse: 57  Temp: 98.6 F (37 C)  TempSrc: Oral  Height: 5\' 4"  (1.626 m)  Weight: 222 lb (100.699 kg)  SpO2: 97%   Body mass index is 38.09 kg/(m^2).  Physical Exam  Constitutional: She is oriented to person, place, and time. She appears well-developed and well-nourished.  HENT:  Mouth/Throat: Oropharynx is clear and moist. No oropharyngeal exudate.  Eyes: Pupils are equal, round, and reactive to light. No scleral icterus.  Neck: Neck supple. Carotid bruit is not present. No tracheal deviation present. No thyromegaly  present.  Cardiovascular: Normal rate, regular rhythm, normal heart sounds and intact distal pulses.  Exam reveals no gallop and no friction rub.   No murmur heard. No LE edema b/l. no calf TTP.   Pulmonary/Chest: Effort normal and breath sounds normal. No stridor. No respiratory distress. She has no wheezes. She has no rales.  Abdominal: Soft. Bowel sounds are normal. She exhibits no distension and no mass. There is no hepatomegaly. There is no tenderness. There is no rebound and no guarding.  Musculoskeletal: She exhibits edema and tenderness.  Gait unstable. Uses rollator with seat/brakes  Lymphadenopathy:    She has no cervical adenopathy.  Neurological: She is alert and oriented to person,  place, and time.  Skin: Skin is warm and dry. No rash noted.  Psychiatric: She has a normal mood and affect. Her behavior is normal. Judgment and thought content normal.     Labs reviewed: No visits with results within 3 Month(s) from this visit. Latest known visit with results is:  Office Visit on 02/16/2015  Component Date Value Ref Range Status  . Glucose 02/16/2015 82  65 - 99 mg/dL Final  . BUN 40/98/1191 14  8 - 27 mg/dL Final  . Creatinine, Ser 02/16/2015 0.90  0.57 - 1.00 mg/dL Final  . GFR calc non Af Amer 02/16/2015 68  >59 mL/min/1.73 Final  . GFR calc Af Amer 02/16/2015 79  >59 mL/min/1.73 Final  . BUN/Creatinine Ratio 02/16/2015 16  11 - 26 Final  . Sodium 02/16/2015 141  134 - 144 mmol/L Final  . Potassium 02/16/2015 4.6  3.5 - 5.2 mmol/L Final  . Chloride 02/16/2015 102  97 - 108 mmol/L Final  . CO2 02/16/2015 24  18 - 29 mmol/L Final  . Calcium 02/16/2015 9.1  8.7 - 10.3 mg/dL Final  . Total Protein 02/16/2015 6.4  6.0 - 8.5 g/dL Final  . Albumin 47/82/9562 4.0  3.6 - 4.8 g/dL Final  . Globulin, Total 02/16/2015 2.4  1.5 - 4.5 g/dL Final  . Albumin/Globulin Ratio 02/16/2015 1.7  1.1 - 2.5 Final  . Bilirubin Total 02/16/2015 0.5  0.0 - 1.2 mg/dL Final  . Alkaline  Phosphatase 02/16/2015 76  39 - 117 IU/L Final  . AST 02/16/2015 12  0 - 40 IU/L Final  . ALT 02/16/2015 7  0 - 32 IU/L Final  . Hgb A1c MFr Bld 02/16/2015 5.7* 4.8 - 5.6 % Final   Comment:          Pre-diabetes: 5.7 - 6.4          Diabetes: >6.4          Glycemic control for adults with diabetes: <7.0   . Est. average glucose Bld gHb Est-m* 02/16/2015 117   Final    No results found.   Assessment/Plan   ICD-9-CM ICD-10-CM   1. Recurrent falls - probably due to #3, 4, 5 V15.88 R29.6 CBC with Differential     Urinalysis with Reflex Microscopic     Ambulatory referral to Home Health  2. Encounter for immunization Z23 Z23   3. Idiopathic peripheral neuropathy (HCC) - stabel 356.9 G60.9 CMP     Ambulatory referral to Home Health  4. Spinal stenosis, unspecified spinal region - pain controlled 724.00 M48.00 Ambulatory referral to Home Health  5. Myalgia and myositis - stable 729.1 M79.1 Ambulatory referral to Home Health    M60.9   6. Essential hypertension - stable 401.9 I10 CMP     CBC with Differential  7. Prediabetes - stable 790.29 R73.03 CMP  8. Breast cancer screening V76.10 Z12.39 MM DIGITAL SCREENING BILATERAL  9. Colon cancer screening V76.51 Z12.11 Ambulatory referral to Gastroenterology  10. Exposure to hepatitis C V01.79 Z20.5 Hep C Antibody     CANCELED: Hepatitis C Ab Reflex HCV RNA, QUANT  11. Long-term use of high-risk medication V58.69 Z79.899 CMP  12. Mild episode of recurrent major depressive disorder (HCC) - uncontrolled 296.31 F33.0 citalopram (CELEXA) 10 MG tablet    Start 1/2 tablet of citalopram for depression  Continue other medications as ordered  Follow up in 1 month for depression  Waneda Klammer [facility] [address] [place], [place] [address] [phone] Cell (Monday-Friday 8 AM - 5 PM) [phone] After 5 PM and follow prompts

## 2015-05-23 NOTE — Patient Instructions (Addendum)
Bring copy of Advance Directives- Health Care Power of Attorney and/or Living Will to next appointment.   Take 1/2 tablet of citalopram for depression  Continue other medications as ordered  Follow up in 1 month for depression

## 2015-05-24 LAB — CBC WITH DIFFERENTIAL/PLATELET
Basophils Absolute: 0.1 10*3/uL (ref 0.0–0.2)
Basos: 2 %
EOS (ABSOLUTE): 0.3 10*3/uL (ref 0.0–0.4)
Eos: 6 %
Hematocrit: 37.4 % (ref 34.0–46.6)
Hemoglobin: 12.2 g/dL (ref 11.1–15.9)
Immature Grans (Abs): 0 10*3/uL (ref 0.0–0.1)
Immature Granulocytes: 0 %
Lymphocytes Absolute: 1.8 10*3/uL (ref 0.7–3.1)
Lymphs: 41 %
MCH: 27.1 pg (ref 26.6–33.0)
MCHC: 32.6 g/dL (ref 31.5–35.7)
MCV: 83 fL (ref 79–97)
Monocytes Absolute: 0.4 10*3/uL (ref 0.1–0.9)
Monocytes: 9 %
Neutrophils Absolute: 1.9 10*3/uL (ref 1.4–7.0)
Neutrophils: 42 %
Platelets: 237 10*3/uL (ref 150–379)
RBC: 4.51 x10E6/uL (ref 3.77–5.28)
RDW: 14 % (ref 12.3–15.4)
WBC: 4.5 10*3/uL (ref 3.4–10.8)

## 2015-05-24 LAB — URINALYSIS, ROUTINE W REFLEX MICROSCOPIC
Bilirubin, UA: NEGATIVE
Glucose, UA: NEGATIVE
Ketones, UA: NEGATIVE
Leukocytes, UA: NEGATIVE
Nitrite, UA: NEGATIVE
Protein, UA: NEGATIVE
RBC, UA: NEGATIVE
Specific Gravity, UA: 1.01 (ref 1.005–1.030)
Urobilinogen, Ur: 1 mg/dL (ref 0.2–1.0)
pH, UA: 6 (ref 5.0–7.5)

## 2015-05-24 LAB — COMPREHENSIVE METABOLIC PANEL
ALT: 5 IU/L (ref 0–32)
AST: 10 IU/L (ref 0–40)
Albumin/Globulin Ratio: 1.7 (ref 1.1–2.5)
Albumin: 4.1 g/dL (ref 3.6–4.8)
Alkaline Phosphatase: 78 IU/L (ref 39–117)
BUN/Creatinine Ratio: 10 — ABNORMAL LOW (ref 11–26)
BUN: 10 mg/dL (ref 8–27)
Bilirubin Total: 0.6 mg/dL (ref 0.0–1.2)
CO2: 25 mmol/L (ref 18–29)
Calcium: 9.3 mg/dL (ref 8.7–10.3)
Chloride: 100 mmol/L (ref 97–106)
Creatinine, Ser: 0.99 mg/dL (ref 0.57–1.00)
GFR calc Af Amer: 70 mL/min/{1.73_m2} (ref >59)
GFR calc non Af Amer: 61 mL/min/{1.73_m2} (ref >59)
Globulin, Total: 2.4 g/dL (ref 1.5–4.5)
Glucose: 72 mg/dL (ref 65–99)
Potassium: 4.6 mmol/L (ref 3.5–5.2)
Sodium: 140 mmol/L (ref 136–144)
Total Protein: 6.5 g/dL (ref 6.0–8.5)

## 2015-05-24 LAB — HEPATITIS C ANTIBODY: Hep C Virus Ab: <0.1 s/co ratio (ref 0.0–0.9)

## 2015-05-28 ENCOUNTER — Telehealth: Payer: Self-pay | Admitting: *Deleted

## 2015-05-28 NOTE — Telephone Encounter (Signed)
Patient called and left message on voicemail and stated that she felt like her ears are clogged up and wants a referral for a hearing test. I tried calling patient back and left message on her voicemail that she would need to schedule an appointment to be seen.

## 2015-06-13 ENCOUNTER — Telehealth: Payer: Self-pay | Admitting: *Deleted

## 2015-06-13 NOTE — Telephone Encounter (Signed)
LMOM to let patient know that Dr. Montez Morita approved her hydrocodone for tomorrow.

## 2015-06-13 NOTE — Telephone Encounter (Signed)
Wants to know if she can get her hydrocodone tomorrow, since she has to try to get here/pharmacy using [employer]. Please Advise!

## 2015-06-13 NOTE — Telephone Encounter (Signed)
If she is due for hydrocodone then she can have it tomorrow.

## 2015-06-14 ENCOUNTER — Other Ambulatory Visit: Payer: Self-pay | Admitting: *Deleted

## 2015-06-14 DIAGNOSIS — M4800 Spinal stenosis, site unspecified: Secondary | ICD-10-CM

## 2015-06-14 DIAGNOSIS — IMO0001 Reserved for inherently not codable concepts without codable children: Secondary | ICD-10-CM

## 2015-06-14 MED ORDER — OXYCODONE-ACETAMINOPHEN 5-325 MG PO TABS: ORAL_TABLET | ORAL | Status: DC

## 2015-06-22 ENCOUNTER — Encounter: Payer: Self-pay | Admitting: Internal Medicine

## 2015-06-22 ENCOUNTER — Ambulatory Visit: Payer: Commercial Managed Care - HMO | Admitting: Internal Medicine

## 2015-07-05 ENCOUNTER — Encounter: Payer: Self-pay | Admitting: Internal Medicine

## 2015-07-12 ENCOUNTER — Encounter: Payer: Self-pay | Admitting: Internal Medicine

## 2015-07-13 ENCOUNTER — Other Ambulatory Visit: Payer: Self-pay | Admitting: *Deleted

## 2015-07-13 DIAGNOSIS — IMO0001 Reserved for inherently not codable concepts without codable children: Secondary | ICD-10-CM

## 2015-07-13 DIAGNOSIS — M4800 Spinal stenosis, site unspecified: Secondary | ICD-10-CM

## 2015-07-13 MED ORDER — OXYCODONE-ACETAMINOPHEN 5-325 MG PO TABS: ORAL_TABLET | ORAL | Status: DC

## 2015-07-13 NOTE — Telephone Encounter (Signed)
Patient requested and will pick up 

## 2015-07-18 ENCOUNTER — Encounter: Payer: Commercial Managed Care - HMO | Admitting: Internal Medicine

## 2015-07-24 ENCOUNTER — Telehealth: Payer: Self-pay

## 2015-07-24 NOTE — Telephone Encounter (Signed)
Pt called and said that she needed a letter written by either Dr. Montez Morita or the office manager that stated that climbing the steps at her apartment was too dangerous due to her having vertigo. She needs this letter so that she can be medically released from her current lease.   Message was sent to Dr. Montez Morita explaining request. Awaiting response.

## 2015-07-24 NOTE — Telephone Encounter (Signed)
Ok for letter. Please type and will sign in the AM

## 2015-07-25 ENCOUNTER — Telehealth: Payer: Self-pay

## 2015-07-25 NOTE — Telephone Encounter (Signed)
Patient called to ask if a letter could written for her stating that she is unsafe in a home with stairs due to her unbalance and falling, this will  help her get relieved from her lease and be able to get into  a safer home. Please Advise

## 2015-07-25 NOTE — Telephone Encounter (Signed)
Letter has already been written.

## 2015-07-25 NOTE — Telephone Encounter (Signed)
Letter composed. I left message for patient to return call when available to discuss how she would like to retreive letter

## 2015-07-26 NOTE — Telephone Encounter (Signed)
Called patients home left a message telling her that the letter she requested  has already been mailed.

## 2015-08-02 ENCOUNTER — Telehealth: Payer: Self-pay | Admitting: Internal Medicine

## 2015-08-02 NOTE — Telephone Encounter (Signed)
FYI - Mammogram  Several attempts have been made by [facility] and our office to schedule Mammogram for the patient with no success - A letter was mailed to the patient 07/12/15

## 2015-08-03 ENCOUNTER — Telehealth: Payer: Self-pay | Admitting: *Deleted

## 2015-08-03 ENCOUNTER — Emergency Department (HOSPITAL_COMMUNITY): Payer: Commercial Managed Care - HMO

## 2015-08-03 ENCOUNTER — Encounter (HOSPITAL_COMMUNITY): Payer: Self-pay

## 2015-08-03 ENCOUNTER — Emergency Department (HOSPITAL_COMMUNITY)
Admission: EM | Admit: 2015-08-03 | Discharge: 2015-08-03 | Disposition: A | Discharge status: Home / Self Care | Payer: Commercial Managed Care - HMO | Attending: Emergency Medicine | Admitting: Emergency Medicine

## 2015-08-03 DIAGNOSIS — S79911A Unspecified injury of right hip, initial encounter: Secondary | ICD-10-CM | POA: Insufficient documentation

## 2015-08-03 DIAGNOSIS — G44309 Post-traumatic headache, unspecified, not intractable: Secondary | ICD-10-CM | POA: Diagnosis not present

## 2015-08-03 DIAGNOSIS — S199XXA Unspecified injury of neck, initial encounter: Secondary | ICD-10-CM | POA: Insufficient documentation

## 2015-08-03 DIAGNOSIS — Z791 Long term (current) use of non-steroidal anti-inflammatories (NSAID): Secondary | ICD-10-CM | POA: Insufficient documentation

## 2015-08-03 DIAGNOSIS — M1990 Unspecified osteoarthritis, unspecified site: Secondary | ICD-10-CM | POA: Insufficient documentation

## 2015-08-03 DIAGNOSIS — Z7982 Long term (current) use of aspirin: Secondary | ICD-10-CM | POA: Insufficient documentation

## 2015-08-03 DIAGNOSIS — W19XXXA Unspecified fall, initial encounter: Secondary | ICD-10-CM

## 2015-08-03 DIAGNOSIS — M25511 Pain in right shoulder: Secondary | ICD-10-CM | POA: Diagnosis not present

## 2015-08-03 DIAGNOSIS — Y998 Other external cause status: Secondary | ICD-10-CM | POA: Diagnosis not present

## 2015-08-03 DIAGNOSIS — S4991XA Unspecified injury of right shoulder and upper arm, initial encounter: Secondary | ICD-10-CM | POA: Insufficient documentation

## 2015-08-03 DIAGNOSIS — Z79899 Other long term (current) drug therapy: Secondary | ICD-10-CM | POA: Insufficient documentation

## 2015-08-03 DIAGNOSIS — R109 Unspecified abdominal pain: Secondary | ICD-10-CM | POA: Diagnosis not present

## 2015-08-03 DIAGNOSIS — F329 Major depressive disorder, single episode, unspecified: Secondary | ICD-10-CM | POA: Insufficient documentation

## 2015-08-03 DIAGNOSIS — S0990XA Unspecified injury of head, initial encounter: Secondary | ICD-10-CM | POA: Insufficient documentation

## 2015-08-03 DIAGNOSIS — Y92410 Unspecified street and highway as the place of occurrence of the external cause: Secondary | ICD-10-CM | POA: Diagnosis not present

## 2015-08-03 DIAGNOSIS — Y9389 Activity, other specified: Secondary | ICD-10-CM | POA: Diagnosis not present

## 2015-08-03 DIAGNOSIS — M546 Pain in thoracic spine: Secondary | ICD-10-CM | POA: Diagnosis not present

## 2015-08-03 DIAGNOSIS — I10 Essential (primary) hypertension: Secondary | ICD-10-CM | POA: Insufficient documentation

## 2015-08-03 DIAGNOSIS — T148 Other injury of unspecified body region: Secondary | ICD-10-CM | POA: Diagnosis not present

## 2015-08-03 DIAGNOSIS — Z7951 Long term (current) use of inhaled steroids: Secondary | ICD-10-CM | POA: Diagnosis not present

## 2015-08-03 DIAGNOSIS — E559 Vitamin D deficiency, unspecified: Secondary | ICD-10-CM | POA: Diagnosis not present

## 2015-08-03 DIAGNOSIS — M25551 Pain in right hip: Secondary | ICD-10-CM

## 2015-08-03 DIAGNOSIS — M542 Cervicalgia: Secondary | ICD-10-CM | POA: Diagnosis not present

## 2015-08-03 DIAGNOSIS — J45909 Unspecified asthma, uncomplicated: Secondary | ICD-10-CM | POA: Diagnosis not present

## 2015-08-03 DIAGNOSIS — M25521 Pain in right elbow: Secondary | ICD-10-CM | POA: Diagnosis not present

## 2015-08-03 DIAGNOSIS — S59901A Unspecified injury of right elbow, initial encounter: Secondary | ICD-10-CM | POA: Diagnosis not present

## 2015-08-03 DIAGNOSIS — S29002A Unspecified injury of muscle and tendon of back wall of thorax, initial encounter: Secondary | ICD-10-CM | POA: Insufficient documentation

## 2015-08-03 DIAGNOSIS — M25561 Pain in right knee: Secondary | ICD-10-CM | POA: Diagnosis not present

## 2015-08-03 HISTORY — DX: Unspecified asthma, uncomplicated: J45.909

## 2015-08-03 LAB — I-STAT CHEM 8, ED
BUN: 14 mg/dL (ref 6–20)
Calcium, Ion: 1.18 mmol/L (ref 1.13–1.30)
Chloride: 103 mmol/L (ref 101–111)
Creatinine, Ser: 0.9 mg/dL (ref 0.44–1.00)
Glucose, Bld: 80 mg/dL (ref 65–99)
HCT: 43 % (ref 36.0–46.0)
Hemoglobin: 14.6 g/dL (ref 12.0–15.0)
Potassium: 4.3 mmol/L (ref 3.5–5.1)
Sodium: 141 mmol/L (ref 135–145)
TCO2: 30 mmol/L (ref 0–100)

## 2015-08-03 MED ORDER — IOHEXOL 300 MG/ML  SOLN
75.0000 mL | Freq: Once | INTRAMUSCULAR | Status: AC | PRN
  Administered 2015-08-03: 75 mL via INTRAVENOUS

## 2015-08-03 MED ORDER — SODIUM CHLORIDE 0.9 % IV BOLUS (SEPSIS)
500.0000 mL | Freq: Once | INTRAVENOUS | Status: AC
  Administered 2015-08-03: 500 mL via INTRAVENOUS

## 2015-08-03 MED ORDER — HYDROCODONE-ACETAMINOPHEN 5-325 MG PO TABS
2.0000 | ORAL_TABLET | Freq: Once | ORAL | Status: AC
  Administered 2015-08-03: 2 via ORAL
  Filled 2015-08-03: qty 2

## 2015-08-03 NOTE — ED Notes (Signed)
Pt ambulated well with walker in hallway with RN close behind; No acute distress noted

## 2015-08-03 NOTE — ED Provider Notes (Signed)
CSN: [id number]     Arrival date & time 08/03/15  1434 History   First MD Initiated Contact with Patient 08/03/15 1457     Chief Complaint  Patient presents with  . Fall     (Consider location/radiation/quality/duration/timing/severity/associated sxs/prior Treatment) HPI Comments: 65 year old female with history of peripheral neuropathy, depression, high blood pressure presents after a fall. Patient is rushing trying to catch the bus trip down 3 steps. Patient landed on the back her head in the left side. Patient has neck pain headache left arm and left hip pain. No loss of consciousness, no symptoms prior. Patient is on aspirin. Pain with range of motion  Patient is a 65 y.o. female presenting with fall. The history is provided by the patient.  Fall Pertinent negatives include no chest pain, no abdominal pain, no headaches and no shortness of breath.    Past Medical History  Diagnosis Date  . Myalgia and myositis, unspecified   . Unspecified vitamin D deficiency   . Depressive disorder, not elsewhere classified   . Essential hypertension, benign   . Raynaud's syndrome   . Sciatica   . Enthesopathy of hip region   . Muscle weakness (generalized)   . Other malaise and fatigue   . Other abnormal blood chemistry   . Personal history of arthritis   . Personal history of fall   . Bursitis   . Asthma    Past Surgical History  Procedure Laterality Date  . Abdominal hysterectomy  1985   Family History  Problem Relation Age of Onset  . Dementia Mother   . Heart disease Mother   . Hypertension Mother   . Diabetes Mother   . Diabetes Sister   . Cancer Sister     lung  . Cancer Other     Nepher (Mothers side)  . Diabetes Maternal Grandmother   . Diabetes Cousin     Mother's side  . Arthritis Maternal Grandmother   . Arthritis Sister   . Arthritis Cousin     Mother's side    Social History  Substance Use Topics  . Smoking status: Never Smoker   . Smokeless tobacco: Never  Used  . Alcohol Use: No   OB History    No data available     Review of Systems  Constitutional: Negative for fever and chills.  HENT: Negative for congestion.   Eyes: Negative for visual disturbance.  Respiratory: Negative for shortness of breath.   Cardiovascular: Negative for chest pain.  Gastrointestinal: Negative for vomiting and abdominal pain.  Genitourinary: Negative for dysuria and flank pain.  Musculoskeletal: Positive for back pain, arthralgias and neck pain. Negative for neck stiffness.  Skin: Negative for rash.  Neurological: Negative for weakness, light-headedness, numbness and headaches.      Allergies  Review of patient's allergies indicates no known allergies.  Home Medications   Prior to Admission medications   Medication Sig Start Date End Date Taking? Authorizing Provider  albuterol (PROVENTIL HFA;VENTOLIN HFA) 108 (90 BASE) MCG/ACT inhaler Inhale 2 puffs into the lungs every 6 (six) hours as needed for wheezing or shortness of breath. 12/20/14   Kirt Boys, DO  amitriptyline (ELAVIL) 25 MG tablet Take two tablets by mouth at bedtime for rest 02/26/15   Kirt Boys, DO  aspirin 81 MG tablet Take 81 mg by mouth daily.    Historical Provider, MD  celecoxib (CELEBREX) 100 MG capsule Take one capsule by mouth twice daily for pains 04/12/15   Kirt Boys, DO  citalopram (CELEXA) 10 MG tablet Take 1 tablet (10 mg total) by mouth daily. 05/23/15   Kirt Boys, DO  doxepin (SINEQUAN) 50 MG capsule Take 2 capsules (100 mg total) by mouth at bedtime. 02/26/15   Kirt Boys, DO  fluticasone (FLONASE) 50 MCG/ACT nasal spray Place 2 sprays into both nostrils daily as needed for allergies or rhinitis. 11/14/14   Kirt Boys, DO  gabapentin (NEURONTIN) 400 MG capsule Take three capsules by mouth twice daily for pains 02/26/15   Kirt Boys, DO  loratadine (CLARITIN) 10 MG tablet Take 1 tablet (10 mg total) by mouth daily. 11/03/14   Kirt Boys, DO  methocarbamol  (ROBAXIN) 500 MG tablet Take one tablet by mouth every 8 hours as needed for muscle spasms 02/28/15   Kirt Boys, DO  metoprolol (LOPRESSOR) 50 MG tablet Take one tablet by mouth twice daily for blood pressure 02/26/15   Kirt Boys, DO  oxyCODONE-acetaminophen (ROXICET) 5-325 MG tablet Take 1 tablet every 6 hours as needed for moderate-severe pain 07/13/15   Tiffany L Reed, DO  Tdap (BOOSTRIX) 5-2.5-18.5 LF-MCG/0.5 injection Inject 0.5 mLs into the muscle once. 05/23/15   Kirt Boys, DO  vitamin D, CHOLECALCIFEROL, 400 UNITS tablet Take by mouth daily. Take three  Capsules by mouth to equal 1200mg  daily.    Historical Provider, MD  zoster vaccine live, PF, (ZOSTAVAX) UNT/0.65ML injection Inject 19,400 Units into the skin once. 05/23/15   Monica Carter, DO   BP 147/83 mmHg  Pulse 132  Temp(Src) 97.6 F (36.4 C) (Oral)  Resp 16  Ht 5' 3.5" (1.613 m)  Wt 222 lb (100.699 kg)  BMI 38.70 kg/m2  SpO2 100% Physical Exam  Constitutional: She is oriented to person, place, and time. She appears well-developed and well-nourished.  HENT:  Head: Normocephalic and atraumatic.  Eyes: Conjunctivae are normal. Right eye exhibits no discharge. Left eye exhibits no discharge.  Neck: Normal range of motion. Neck supple. No tracheal deviation present.  Cardiovascular: Normal rate and regular rhythm.   Pulmonary/Chest: Effort normal and breath sounds normal.  Abdominal: Soft. She exhibits no distension. There is no tenderness. There is no guarding.  Musculoskeletal: She exhibits tenderness. She exhibits no edema.  Patient has tenderness lower cervical midline and upper thoracic and midthoracic region midline paraspinal. Patient has no lumbar tenderness. Patient is c-collar in place. Patient is mild tenderness with right hip flexion and external rotation no leg shortening. Patient has mild tenderness proximal humerus. No significant tenderness to elbows or wrists bilateral. No tenderness to knees or  ankles bilateral.  Neurological: She is alert and oriented to person, place, and time. She has normal strength. No cranial nerve deficit or sensory deficit. GCS eye subscore is 4. GCS verbal subscore is 5. GCS motor subscore is 6.  Skin: Skin is warm. No rash noted.  Psychiatric: She has a normal mood and affect.  Nursing note and vitals reviewed.   ED Course  Procedures (including critical care time) Labs Review Labs Reviewed  I-STAT CHEM 8, ED    Imaging Review Dg Chest 2 View  08/03/2015  CLINICAL DATA:  10/01/2015 down steps and hit head on concrete. Complaining of upper back pain. EXAM: CHEST  2 VIEW COMPARISON:  Thoracic spine 08/03/2015 FINDINGS: There is nodular fullness in the right hilum and along the right side of the heart. The azygos shadow is also prominent. There is concern for a nodular opacity along the lateral aspect of the left lower chest near the anterior left  fifth rib. Heart size is normal. No large pleural effusions. No significant airspace disease. Negative for pneumothorax. IMPRESSION: Nodular densities throughout the right hilar region and uncertain opacity along the right cardiac border. These structures could be vascular but cannot exclude soft tissue lesions or lymphadenopathy. In addition, there is an indeterminate nodular density along the lateral aspect of the left lung. Recommend further characterization of these chest findings with a chest CT. Electronically Signed   By: Richarda Overlie M.D.   On: 08/03/2015 16:39   Dg Thoracic Spine 2 View  08/03/2015  CLINICAL DATA:  Status post fall down 4 steps on concrete today. Thoracic spine pain. Initial encounter. EXAM: THORACIC SPINE 2 VIEWS COMPARISON:  MRI thoracic spine 01/20/2013. FINDINGS: There is no evidence of thoracic spine fracture. Alignment is normal. No other significant bone abnormalities are identified. IMPRESSION: Negative exam. Electronically Signed   By: Drusilla Kanner M.D.   On: 08/03/2015 16:36   Dg Shoulder  Right  08/03/2015  CLINICAL DATA:  Larey Seat down 4 steps today, hit head on concrete, RIGHT shoulder and RIGHT humeral pain EXAM: RIGHT SHOULDER - 2+ VIEW COMPARISON:  None FINDINGS: Osseous demineralization. AC joint alignment normal. No glenohumeral fracture dislocation. Visualized RIGHT ribs intact. IMPRESSION: No acute osseous abnormalities. Electronically Signed   By: Ulyses Southward M.D.   On: 08/03/2015 16:41   Dg Elbow Complete Right  08/03/2015  CLINICAL DATA:  Fall down stairs.  Posterior elbow pain. EXAM: RIGHT ELBOW - COMPLETE 3+ VIEW COMPARISON:  Shoulder found to 317 FINDINGS: No evidence of fracture of the ulna or humerus. The radial head is normal. No joint effusion. Cortical irregularity along the medial epicondyles favored degenerative and nontraumatic. IMPRESSION: No acute osseous abnormality. Electronically Signed   By: Genevive Bi M.D.   On: 08/03/2015 18:38   Ct Head Wo Contrast  08/03/2015  CLINICAL DATA:  Posttraumatic headache and posterior neck pain after falling down steps today. No reported loss of consciousness. EXAM: CT HEAD WITHOUT CONTRAST CT CERVICAL SPINE WITHOUT CONTRAST TECHNIQUE: Multidetector CT imaging of the head and cervical spine was performed following the standard protocol without intravenous contrast. Multiplanar CT image reconstructions of the cervical spine were also generated. COMPARISON:  None. FINDINGS: CT HEAD FINDINGS Bony calvarium appears intact. No mass effect or midline shift is noted. Ventricular size is within normal limits. There is no evidence of mass lesion, hemorrhage or acute infarction. CT CERVICAL SPINE FINDINGS No fracture or spondylolisthesis is noted. Disc spaces and posterior facet joints appear intact. Visualized lung fields appear normal. IMPRESSION: Normal head CT. Normal cervical spine. Electronically Signed   By: Lupita Raider, M.D.   On: 08/03/2015 17:52   Ct Chest W Contrast  08/03/2015  CLINICAL DATA:  Fall today.  Right flank pain.   Abnormal x-ray. EXAM: CT CHEST WITH CONTRAST TECHNIQUE: Multidetector CT imaging of the chest was performed during intravenous contrast administration. CONTRAST:  61mL OMNIPAQUE IOHEXOL 300 MG/ML  SOLN COMPARISON:  Radiographs earlier this day. FINDINGS: No lymphadenopathy or soft tissue mass to account for the nodular appearance of the right hilum on radiograph. Findings likely related to overlapping vascular structures. There is smooth subpleural fat herniation in the lateral left sixth-seventh rib interspace accounting for the nodular left lung density on radiograph. No acute traumatic aortic injury. Thoracic aorta is normal in caliber. Mild tortuosity of the descending aorta, no aneurysm. Heart is at the upper limits of normal in size. There is no mediastinal or hilar adenopathy, with particular  attention to the right hilum. There is no pleural or pericardial effusion. Mild dependent and linear atelectasis in both lower lobes. No consolidation or contusion. No suspicious nodule or mass. No acute or traumatic abnormality in the included upper abdomen. Scattered calcifications in the liver, likely granulomas. There are no acute or suspicious osseous abnormalities. Particularly, no fracture of the right ribs. IMPRESSION: No acute or suspicious abnormality in the thorax. The questioned right hilar abnormalities on radiograph corresponds to overlapping vascular structures. The nodular density in the lateral left lung corresponds to herniation of subpleural fat. Electronically Signed   By: Rubye Oaks M.D.   On: 08/03/2015 19:54   Ct Cervical Spine Wo Contrast  08/03/2015  CLINICAL DATA:  Posttraumatic headache and posterior neck pain after falling down steps today. No reported loss of consciousness. EXAM: CT HEAD WITHOUT CONTRAST CT CERVICAL SPINE WITHOUT CONTRAST TECHNIQUE: Multidetector CT imaging of the head and cervical spine was performed following the standard protocol without intravenous contrast.  Multiplanar CT image reconstructions of the cervical spine were also generated. COMPARISON:  None. FINDINGS: CT HEAD FINDINGS Bony calvarium appears intact. No mass effect or midline shift is noted. Ventricular size is within normal limits. There is no evidence of mass lesion, hemorrhage or acute infarction. CT CERVICAL SPINE FINDINGS No fracture or spondylolisthesis is noted. Disc spaces and posterior facet joints appear intact. Visualized lung fields appear normal. IMPRESSION: Normal head CT. Normal cervical spine. Electronically Signed   By: Lupita Raider, M.D.   On: 08/03/2015 17:52   Ct Hip Right Wo Contrast  08/03/2015  CLINICAL DATA:  Right hip pain after fall. EXAM: CT OF THE RIGHT HIP WITHOUT CONTRAST TECHNIQUE: Multidetector CT imaging of the right hip was performed according to the standard protocol. Multiplanar CT image reconstructions were also generated. COMPARISON:  Radiographs earlier this day. FINDINGS: No fracture or dislocation. Femoral head is located. Acetabulum and pubic rami are intact. Minimal narrowing of the right hip joint. Degenerative change at the pubic symphysis and included right sacroiliac joint. No visualized soft tissue hematoma in the coned down field of view. IMPRESSION: No fracture or dislocation of the right hip. Electronically Signed   By: Rubye Oaks M.D.   On: 08/03/2015 20:29   Dg Humerus Right  08/03/2015  CLINICAL DATA:  Fall. EXAM: RIGHT HUMERUS - 2+ VIEW COMPARISON:  08/03/2015 FINDINGS: There may be a small fracture involving the medial epicondyle. The remaining visualized osseous structures appear intact. There is no dislocation. IMPRESSION: 1. Cannot rule out small medial epicondyle fracture. Suggest further evaluation with elbow series. Electronically Signed   By: Signa Kell M.D.   On: 08/03/2015 16:40   Dg Hip Unilat With Pelvis 2-3 Views Right  08/03/2015  CLINICAL DATA:  Fall down steps, right hip pain EXAM: DG HIP (WITH OR WITHOUT PELVIS) 2-3V  RIGHT COMPARISON:  None. FINDINGS: No fracture or dislocation is seen. Bilateral hip joint spaces are symmetric. Visualized bony pelvis appears intact. Mild degenerative changes of the pubic symphysis. Mild degenerative changes of the lower lumbar spine. IMPRESSION: No fracture or dislocation is seen. Electronically Signed   By: Charline Bills M.D.   On: 08/03/2015 16:38   I have personally reviewed and evaluated these images and lab results as part of my medical decision-making.   EKG Interpretation None      MDM   Final diagnoses:  Fall  Right shoulder pain  Acute right hip pain   Patient with neuropathy history presents with mechanical fall.  Plan for x-rays and CT head neck. Pain medicines ordered.  Patient's pain improved in the ER. Patient still having mild pain so CT scan a hip ordered negative. X-rays no acute fracture. Chest x-ray had concern for inflammation/malignancy, CT chest ordered and per radiology no acute findings. Patient stable for outpatient follow-up with primary doctor.  Results and differential diagnosis were discussed with the patient/parent/guardian. Xrays were independently reviewed by myself.  Close follow up outpatient was discussed, comfortable with the plan.   Medications  HYDROcodone-acetaminophen (NORCO/VICODIN) 5-325 MG per tablet 2 tablet (2 tablets Oral Given 08/03/15 1722)  sodium chloride 0.9 % bolus 500 mL (0 mLs Intravenous Stopped 08/03/15 1935)  iohexol (OMNIPAQUE) 300 MG/ML solution 75 mL (75 mLs Intravenous Contrast Given 08/03/15 1917)    Filed Vitals:   08/03/15 1730 08/03/15 1745 08/03/15 1800 08/03/15 2000  BP: 163/88 164/88  147/83  Pulse:   132   Temp:      TempSrc:      Resp:      Height:      Weight:      SpO2:   100%     Final diagnoses:  Fall  Right shoulder pain  Acute right hip pain       Blane Ohara, MD 08/03/15 2038

## 2015-08-03 NOTE — Telephone Encounter (Signed)
Patient called and stated that she fell down some steps and hit her head on concrete. Now the whole right side of her head is numb and her head is pounding from the front to the back and her right side from her hip to her foot is numb. Patient has a caregiver there with her. I instructed patient to go to ER to be evaluated. She agreed and will go now.

## 2015-08-03 NOTE — Telephone Encounter (Signed)
Noted  

## 2015-08-03 NOTE — Discharge Instructions (Signed)
If you were given medicines take as directed.  If you are on coumadin or contraceptives realize their levels and effectiveness is altered by many different medicines.  If you have any reaction (rash, tongues swelling, other) to the medicines stop taking and see a physician.    If your blood pressure was elevated in the ER make sure you follow up for management with a primary doctor or return for chest pain, shortness of breath or stroke symptoms.  Please follow up as directed and return to the ER or see a physician for new or worsening symptoms.  Thank you. Filed Vitals:   08/03/15 1730 08/03/15 1745 08/03/15 1800 08/03/15 2000  BP: 163/88 164/88  147/83  Pulse:   132   Temp:      TempSrc:      Resp:      Height:      Weight:      SpO2:   100%

## 2015-08-03 NOTE — ED Notes (Signed)
Wheeled pt on EZ-lift to restroom. 

## 2015-08-03 NOTE — ED Notes (Signed)
MD at bedside. 

## 2015-08-03 NOTE — ED Notes (Signed)
Pt. Was rushing to catch the [employer] bus, tripped over the 3rd step and fell frontward and hit her head into the door and then fell back wards onto the floor.  Pt. Denies any loc, she is having posterior neck pain, bilateral shoulder pain. Rt. Hip pain, no shortening or deformity noted, Rt. Thigh , rt. Arm pain.  No visible marks noted. Pt. Reports that she also has a headache.    Pt. Is stable denies any chest pain or sob. Skin is warm and dry.

## 2015-08-06 ENCOUNTER — Telehealth: Payer: Self-pay | Admitting: *Deleted

## 2015-08-06 MED ORDER — AMBULATORY NON FORMULARY MEDICATION: Status: DC

## 2015-08-06 NOTE — Telephone Encounter (Signed)
A walker with seat will be a better choice to help her ambulate

## 2015-08-06 NOTE — Telephone Encounter (Signed)
Patient called and stated that she fell and went to the ER. Nothing broken but her ankle hurts and she is having a hard time getting around and would like a Rx for crutches. Offered an appointment but patient refused.  Please Advise.

## 2015-08-06 NOTE — Telephone Encounter (Signed)
Rx printed and patient will pick up.

## 2015-08-06 NOTE — Telephone Encounter (Signed)
Ok for crutches

## 2015-08-06 NOTE — Telephone Encounter (Signed)
Patient stated that she cannot get a walker into her bathroom.

## 2015-08-13 ENCOUNTER — Other Ambulatory Visit: Payer: Self-pay | Admitting: Internal Medicine

## 2015-08-14 ENCOUNTER — Other Ambulatory Visit: Payer: Self-pay | Admitting: *Deleted

## 2015-08-14 DIAGNOSIS — R269 Unspecified abnormalities of gait and mobility: Secondary | ICD-10-CM | POA: Diagnosis not present

## 2015-08-14 DIAGNOSIS — IMO0001 Reserved for inherently not codable concepts without codable children: Secondary | ICD-10-CM

## 2015-08-14 DIAGNOSIS — M4800 Spinal stenosis, site unspecified: Secondary | ICD-10-CM

## 2015-08-14 MED ORDER — OXYCODONE-ACETAMINOPHEN 5-325 MG PO TABS: ORAL_TABLET | ORAL | Status: DC

## 2015-08-14 NOTE — Telephone Encounter (Signed)
Patient requested and will pick up 

## 2015-08-15 ENCOUNTER — Telehealth: Payer: Self-pay | Admitting: Internal Medicine

## 2015-08-15 NOTE — Telephone Encounter (Signed)
Sending Mammogram overdue letter..[facility] °

## 2015-08-16 ENCOUNTER — Encounter: Payer: Self-pay | Admitting: Internal Medicine

## 2015-08-18 ENCOUNTER — Emergency Department (HOSPITAL_COMMUNITY): Payer: Commercial Managed Care - HMO

## 2015-08-18 ENCOUNTER — Emergency Department (HOSPITAL_COMMUNITY)
Admission: EM | Admit: 2015-08-18 | Discharge: 2015-08-18 | Disposition: A | Discharge status: Home / Self Care | Payer: Commercial Managed Care - HMO | Attending: Emergency Medicine | Admitting: Emergency Medicine

## 2015-08-18 ENCOUNTER — Encounter (HOSPITAL_COMMUNITY): Payer: Self-pay | Admitting: *Deleted

## 2015-08-18 DIAGNOSIS — M79602 Pain in left arm: Secondary | ICD-10-CM | POA: Diagnosis not present

## 2015-08-18 DIAGNOSIS — J45909 Unspecified asthma, uncomplicated: Secondary | ICD-10-CM | POA: Diagnosis not present

## 2015-08-18 DIAGNOSIS — R51 Headache: Secondary | ICD-10-CM | POA: Insufficient documentation

## 2015-08-18 DIAGNOSIS — M25512 Pain in left shoulder: Secondary | ICD-10-CM | POA: Insufficient documentation

## 2015-08-18 DIAGNOSIS — I10 Essential (primary) hypertension: Secondary | ICD-10-CM | POA: Diagnosis not present

## 2015-08-18 DIAGNOSIS — H5713 Ocular pain, bilateral: Secondary | ICD-10-CM | POA: Insufficient documentation

## 2015-08-18 LAB — CBC
HCT: 40 % (ref 36.0–46.0)
Hemoglobin: 12.5 g/dL (ref 12.0–15.0)
MCH: 26.7 pg (ref 26.0–34.0)
MCHC: 31.3 g/dL (ref 30.0–36.0)
MCV: 85.5 fL (ref 78.0–100.0)
Platelets: 292 10*3/uL (ref 150–400)
RBC: 4.68 MIL/uL (ref 3.87–5.11)
RDW: 13.1 % (ref 11.5–15.5)
WBC: 4.9 10*3/uL (ref 4.0–10.5)

## 2015-08-18 LAB — BASIC METABOLIC PANEL
Anion gap: 12 (ref 5–15)
BUN: 12 mg/dL (ref 6–20)
CO2: 26 mmol/L (ref 22–32)
Calcium: 9 mg/dL (ref 8.9–10.3)
Chloride: 105 mmol/L (ref 101–111)
Creatinine, Ser: 0.92 mg/dL (ref 0.44–1.00)
GFR calc Af Amer: >60 mL/min (ref >60)
GFR calc non Af Amer: >60 mL/min (ref >60)
Glucose, Bld: 83 mg/dL (ref 65–99)
Potassium: 3.7 mmol/L (ref 3.5–5.1)
Sodium: 143 mmol/L (ref 135–145)

## 2015-08-18 LAB — I-STAT TROPONIN, ED: Troponin i, poc: 0 ng/mL (ref 0.00–0.08)

## 2015-08-18 NOTE — ED Notes (Signed)
Called Pt no response

## 2015-08-18 NOTE — ED Notes (Signed)
Pt called twice to update vital signs with no answer.

## 2015-08-18 NOTE — ED Notes (Addendum)
Pt reports lying down and sudden onset of severe pain to left arm and shoulder, has headache and pain to bilateral eyes. Denies sob or n/v. ekg done at triage. Was here on 2/3 for a fall down steps.

## 2015-08-18 NOTE — ED Notes (Signed)
Pt called twice to update vital signs with no answer.  

## 2015-09-04 DIAGNOSIS — Z139 Encounter for screening, unspecified: Secondary | ICD-10-CM

## 2015-09-06 NOTE — Congregational Nurse Program (Signed)
Congregational Nurse Program Note  Date of Encounter: 09/04/2015  Past Medical History: Past Medical History  Diagnosis Date  . Myalgia and myositis, unspecified   . Unspecified vitamin D deficiency   . Depressive disorder, not elsewhere classified   . Essential hypertension, benign   . Raynaud's syndrome   . Sciatica   . Enthesopathy of hip region   . Muscle weakness (generalized)   . Other malaise and fatigue   . Other abnormal blood chemistry   . Personal history of arthritis   . Personal history of fall   . Bursitis   . Asthma     Encounter Details:     CNP Questionnaire - 09/04/15 0949    Patient Demographics   Is this a new or existing patient? New   Patient is considered a/an Not Applicable   Race American Indian/[place] Native   Patient Assistance   Location of Patient Assistance Not Applicable   Patient's financial/insurance status Low Income;Medicaid;Medicare   Uninsured Patient No   Patient referred to apply for the following financial assistance Not Applicable   Food insecurities addressed Provided food supplies   Transportation assistance No   Assistance securing medications No   Educational health offerings Hypertension   Encounter Details   Primary purpose of visit Chronic Illness/Condition Visit;Education/Health Concerns   Was an Emergency Department visit averted? Not Applicable   Does patient have a medical provider? Yes   Patient referred to Not Applicable   Was a mental health screening completed? (GAINS tool) No   Does patient have dental issues? No   Does patient have vision issues? No   Since previous encounter, have you referred patient for abnormal blood pressure that resulted in a new diagnosis or medication change? No   Since previous encounter, have you referred patient for abnormal blood glucose that resulted in a new diagnosis or medication change? No   For Abstraction Use Only   Does patient have insurance? Yes       B/P check  130/78

## 2015-09-12 ENCOUNTER — Other Ambulatory Visit: Payer: Self-pay | Admitting: Internal Medicine

## 2015-09-13 ENCOUNTER — Other Ambulatory Visit: Payer: Self-pay | Admitting: *Deleted

## 2015-09-13 DIAGNOSIS — M4800 Spinal stenosis, site unspecified: Secondary | ICD-10-CM

## 2015-09-13 DIAGNOSIS — IMO0001 Reserved for inherently not codable concepts without codable children: Secondary | ICD-10-CM

## 2015-09-13 MED ORDER — OXYCODONE-ACETAMINOPHEN 5-325 MG PO TABS: ORAL_TABLET | ORAL | Status: DC

## 2015-09-13 NOTE — Telephone Encounter (Signed)
Patient requested and will pick up 

## 2015-09-20 ENCOUNTER — Encounter: Payer: Self-pay | Admitting: Internal Medicine

## 2015-09-20 ENCOUNTER — Ambulatory Visit (INDEPENDENT_AMBULATORY_CARE_PROVIDER_SITE_OTHER): Payer: Commercial Managed Care - HMO | Admitting: Internal Medicine

## 2015-09-20 VITALS — BP 128/60 | HR 78 | Temp 97.9°F | Ht 64.0 in | Wt 229.0 lb

## 2015-09-20 DIAGNOSIS — M792 Neuralgia and neuritis, unspecified: Secondary | ICD-10-CM

## 2015-09-20 DIAGNOSIS — G5692 Unspecified mononeuropathy of left upper limb: Secondary | ICD-10-CM

## 2015-09-20 DIAGNOSIS — M4800 Spinal stenosis, site unspecified: Secondary | ICD-10-CM | POA: Diagnosis not present

## 2015-09-20 DIAGNOSIS — M797 Fibromyalgia: Secondary | ICD-10-CM | POA: Diagnosis not present

## 2015-09-20 NOTE — Progress Notes (Signed)
Patient ID: Melinda [name], female   DOB: October 18, 1950, 65 y.o.   MRN: [id number]   Location:  [facility] clinic Provider: Brooksie Ellwanger L. Renato Gails, D.O., C.M.D.  Goals of Care:  Advanced Directives 09/20/2015  Does patient have an advance directive? Yes  Copy of advanced directive(s) in chart? No - copy requested  Would patient like information on creating an advanced directive? Yes - Merchandiser, retail Complaint  Patient presents with  . Acute Visit    upper back pain on left side, stings, burns and itches    HPI: Patient is a 65 y.o. female seen today for an acute visit for left sided upper back pain that stings, burns and itches.  She is more stress b/c she'll be homeless at the end of the month.  No rash.  Has known fibromyalgia.  Having spasms of muscles.  Also has spinal stenosis especially C6.   Is taking gabapentin, elavil.  Only takes the oxycodone for the sciatica pain when needed.  She also says she never had chicken pox.    Past Medical History  Diagnosis Date  . Myalgia and myositis, unspecified   . Unspecified vitamin D deficiency   . Depressive disorder, not elsewhere classified   . Essential hypertension, benign   . Raynaud's syndrome   . Sciatica   . Enthesopathy of hip region   . Muscle weakness (generalized)   . Other malaise and fatigue   . Other abnormal blood chemistry   . Personal history of arthritis   . Personal history of fall   . Bursitis   . Asthma     Past Surgical History  Procedure Laterality Date  . Abdominal hysterectomy  1985    No Known Allergies    Medication List       This list is accurate as of: 09/20/15  3:22 PM.  Always use your most recent med list.               albuterol 108 (90 Base) MCG/ACT inhaler  Commonly known as:  PROVENTIL HFA;VENTOLIN HFA  Inhale 2 puffs into the lungs every 6 (six) hours as needed for wheezing or shortness of breath.     amitriptyline 25 MG tablet  Commonly known as:  ELAVIL  Take two  tablets by mouth at bedtime for rest     aspirin 81 MG tablet  Take 81 mg by mouth daily.     celecoxib 100 MG capsule  Commonly known as:  CELEBREX  Take one capsule by mouth twice daily for pains     citalopram 10 MG tablet  Commonly known as:  CELEXA  Take 1 tablet (10 mg total) by mouth daily.     doxepin 50 MG capsule  Commonly known as:  SINEQUAN  Take 2 capsules (100 mg total) by mouth at bedtime.     EQ LORATADINE 10 MG tablet  Generic drug:  loratadine  TAKE ONE TABLET BY MOUTH ONCE DAILY     fluticasone 50 MCG/ACT nasal spray  Commonly known as:  FLONASE  Place 2 sprays into both nostrils daily as needed for allergies or rhinitis.     gabapentin 400 MG capsule  Commonly known as:  NEURONTIN  Take three capsules by mouth twice daily for pains     methocarbamol 500 MG tablet  Commonly known as:  ROBAXIN  Take one tablet by mouth every 8 hours as needed for muscle spasms     metoprolol 50 MG tablet  Commonly  known as:  LOPRESSOR  Take one tablet by mouth twice daily for blood pressure     oxyCODONE-acetaminophen 5-325 MG tablet  Commonly known as:  ROXICET  Take 1 tablet every 6 hours as needed for moderate-severe pain     vitamin D (CHOLECALCIFEROL) 400 units tablet  Take by mouth daily. Take three  Capsules by mouth to equal 1200mg  daily.        Review of Systems:  Review of Systems  Constitutional: Negative for fever, chills and malaise/fatigue.  Musculoskeletal: Positive for myalgias and neck pain. Negative for joint pain.  Skin: Positive for itching. Negative for rash.  Neurological: Positive for tingling and sensory change. Negative for weakness.  Psychiatric/Behavioral: Positive for depression. The patient is nervous/anxious.     Health Maintenance  Topic Date Due  . HIV Screening  10/19/1966  . TETANUS/TDAP  10/19/1970  . MAMMOGRAM  10/18/2001  . COLONOSCOPY  10/18/2001  . ZOSTAVAX  10/19/2011  . INFLUENZA VACCINE  01/29/2016  . PAP SMEAR   08/15/2018  . Hepatitis C Screening  Completed    Physical Exam: Filed Vitals:   09/20/15 1505  BP: 128/60  Pulse: 78  Temp: 97.9 F (36.6 C)  TempSrc: Oral  Height: 5\' 4"  (1.626 m)  Weight: 229 lb (103.874 kg)  SpO2: 96%   Body mass index is 39.29 kg/(m^2). Physical Exam  Constitutional: She is oriented to person, place, and time.  Musculoskeletal: She exhibits tenderness.  Of fibromyalgia tenderpoints, but no real local tenderness at site described on exam  Neurological: She is alert and oriented to person, place, and time. No cranial nerve deficit.  Skin: Skin is warm and dry. No rash noted.    Labs reviewed: Basic Metabolic Panel:  Recent Labs  16/10/96 1507 05/23/15 1447 08/03/15 1821 08/18/15 1407  NA 141 140 141 143  K 4.6 4.6 4.3 3.7  CL 102 100 103 105  CO2 24 25  --  26  GLUCOSE 82 72 80 83  BUN 14 10 14 12   CREATININE 0.90 0.99 0.90 0.92  CALCIUM 9.1 9.3  --  9.0   Liver Function Tests:  Recent Labs  02/16/15 1507 05/23/15 1447  AST 12 10  ALT 7 5  ALKPHOS 76 78  BILITOT 0.5 0.6  PROT 6.4 6.5  ALBUMIN 4.0 4.1   No results for input(s): LIPASE, AMYLASE in the last 8760 hours. No results for input(s): AMMONIA in the last 8760 hours. CBC:  Recent Labs  05/23/15 1447 08/03/15 1821 08/18/15 1407  WBC 4.5  --  4.9  NEUTROABS 1.9  --   --   HGB  --  14.6 12.5  HCT 37.4 43.0 40.0  MCV 83  --  85.5  PLT 237  --  292   Lipid Panel: No results for input(s): CHOL, HDL, LDLCALC, TRIG, CHOLHDL, LDLDIRECT in the last 8760 hours. Lab Results  Component Value Date   HGBA1C 5.7* 02/16/2015    Assessment/Plan 1. Neuropathic pain of left shoulder -is taking already 2400mg  of gabapentin and celebrex and as needed percocet -suspect due to her cervical stenosis in the C6 region -advised to call back if she has a rash develop -she is going to try splitting her gabapentin into three different times to help her shoulder  2. Fibromyalgia -cont  current therapy per Dr. Montez Morita  3. Spinal stenosis, unspecified spinal region -in lumbar spine and also cervical spine  Labs/tests ordered:  No new Next appt:  10/26/2015 with Dr. Montez Morita  Nolan Tuazon L. Mili Piltz, D.O. Geriatrics [facility] [address], [place] [address] Cell Phone (Mon-Fri 8am-5pm):  [phone] On Call:  [phone] & follow prompts after 5pm & weekends Office Phone:  [phone] Office Fax:  [phone]

## 2015-10-13 ENCOUNTER — Encounter: Payer: Self-pay | Admitting: Internal Medicine

## 2015-10-16 ENCOUNTER — Telehealth: Payer: Self-pay

## 2015-10-16 DIAGNOSIS — M4800 Spinal stenosis, site unspecified: Secondary | ICD-10-CM

## 2015-10-16 DIAGNOSIS — IMO0001 Reserved for inherently not codable concepts without codable children: Secondary | ICD-10-CM

## 2015-10-16 MED ORDER — OXYCODONE-ACETAMINOPHEN 5-325 MG PO TABS: ORAL_TABLET | ORAL | Status: DC

## 2015-10-16 NOTE — Telephone Encounter (Signed)
Patient called to request a refill on her pain medication I explained to her that I would print the script but I could not say what exact time it would be signed.  Patient became upset and hung up.

## 2015-10-26 ENCOUNTER — Encounter: Payer: Self-pay | Admitting: Internal Medicine

## 2015-10-26 ENCOUNTER — Ambulatory Visit (INDEPENDENT_AMBULATORY_CARE_PROVIDER_SITE_OTHER): Payer: Commercial Managed Care - HMO | Admitting: Internal Medicine

## 2015-10-26 VITALS — BP 130/72 | HR 78 | Temp 97.5°F | Resp 20 | Ht 64.0 in | Wt 220.6 lb

## 2015-10-26 DIAGNOSIS — G609 Hereditary and idiopathic neuropathy, unspecified: Secondary | ICD-10-CM | POA: Diagnosis not present

## 2015-10-26 DIAGNOSIS — M609 Myositis, unspecified: Secondary | ICD-10-CM

## 2015-10-26 DIAGNOSIS — R7303 Prediabetes: Secondary | ICD-10-CM

## 2015-10-26 DIAGNOSIS — Z5181 Encounter for therapeutic drug level monitoring: Secondary | ICD-10-CM

## 2015-10-26 DIAGNOSIS — I10 Essential (primary) hypertension: Secondary | ICD-10-CM | POA: Diagnosis not present

## 2015-10-26 DIAGNOSIS — M4800 Spinal stenosis, site unspecified: Secondary | ICD-10-CM

## 2015-10-26 DIAGNOSIS — IMO0001 Reserved for inherently not codable concepts without codable children: Secondary | ICD-10-CM

## 2015-10-26 DIAGNOSIS — R296 Repeated falls: Secondary | ICD-10-CM | POA: Diagnosis not present

## 2015-10-26 DIAGNOSIS — M791 Myalgia: Secondary | ICD-10-CM

## 2015-10-26 NOTE — Progress Notes (Signed)
Patient ID: Melinda [name], female   DOB: 19-Apr-1951, 65 y.o.   MRN: [id number]    Location:    [facility]   Place of Service:   OFFICE  Chief Complaint  Patient presents with  . Medical Management of Chronic Issues    Patient requested follow-up for fall   . OTHER    Patient is fall risk    HPI:   65 yo female seen today for f/u. She has been homeless since Feb 2017 and is living in shelters. She fell down the steps prior to leaving her last home in Feb 2017 and was taken to the ER where w/u neg. She saw Dr Renato Gails for increased neuropathy and pain. No changes made in meds.  FMS/chronic pain/sciatica/neuropathy - pain controlled on percocet,  Robaxin, celebrex, and neurontin. She was followed by neurology in the past. She has intermittent tingling and takes neurontin and doxepin.  Depression - mood improved on 1/2 tab of citalopram. She is estranged from her family. Denies SI/HI. Currently takes amitriptyline. She gets parietal HA  HTN - BP controlled on metoprolol  Prediabetes - diet controlled. She avoids complex CHO and maintains healthy diet. A1c 5.7%  Obesity - weight down 9 lbs since last OV. She maintains healthy diet and attempts to exercise as tolerated  Past Medical History  Diagnosis Date  . Myalgia and myositis, unspecified   . Unspecified vitamin D deficiency   . Depressive disorder, not elsewhere classified   . Essential hypertension, benign   . Raynaud's syndrome   . Sciatica   . Enthesopathy of hip region   . Muscle weakness (generalized)   . Other malaise and fatigue   . Other abnormal blood chemistry   . Personal history of arthritis   . Personal history of fall   . Bursitis   . Asthma     Past Surgical History  Procedure Laterality Date  . Abdominal hysterectomy  1985    Patient Care Team: Kirt Boys, DO as PCP - General (Internal Medicine)  Social History   Social History  . Marital Status: Single    Spouse Name: N/A  . Number of Children: 3    . Years of Education: 13   Occupational History  .      retired   Social History Main Topics  . Smoking status: Never Smoker   . Smokeless tobacco: Never Used  . Alcohol Use: No  . Drug Use: No  . Sexual Activity: Not on file   Other Topics Concern  . Not on file   Social History Narrative   Patient lives with her grandson. Patient is retired.   Education some college.   Right handed.    Caffeine two cups daily.     reports that she has never smoked. She has never used smokeless tobacco. She reports that she does not drink alcohol or use illicit drugs.  No Known Allergies  Medications: Patient's Medications  New Prescriptions   No medications on file  Previous Medications   ALBUTEROL (PROVENTIL HFA;VENTOLIN HFA) 108 (90 BASE) MCG/ACT INHALER    Inhale 2 puffs into the lungs every 6 (six) hours as needed for wheezing or shortness of breath.   AMITRIPTYLINE (ELAVIL) 25 MG TABLET    Take two tablets by mouth at bedtime for rest   ASPIRIN 81 MG TABLET    Take 81 mg by mouth daily.   CELECOXIB (CELEBREX) 100 MG CAPSULE    Take one capsule by mouth twice daily for  pains   CITALOPRAM (CELEXA) 10 MG TABLET    Take 1 tablet (10 mg total) by mouth daily.   DOXEPIN (SINEQUAN) 50 MG CAPSULE    Take 2 capsules (100 mg total) by mouth at bedtime.   EQ LORATADINE 10 MG TABLET    TAKE ONE TABLET BY MOUTH ONCE DAILY   FLUTICASONE (FLONASE) 50 MCG/ACT NASAL SPRAY    Place 2 sprays into both nostrils daily as needed for allergies or rhinitis.   GABAPENTIN (NEURONTIN) 400 MG CAPSULE    Take three capsules by mouth twice daily for pains   METHOCARBAMOL (ROBAXIN) 500 MG TABLET    Take one tablet by mouth every 8 hours as needed for muscle spasms   METOPROLOL (LOPRESSOR) 50 MG TABLET    Take one tablet by mouth twice daily for blood pressure   OXYCODONE-ACETAMINOPHEN (ROXICET) 5-325 MG TABLET    Take 1 tablet every 6 hours as needed for moderate-severe pain   VITAMIN D, CHOLECALCIFEROL, 400  UNITS TABLET    Take by mouth daily. Take three  Capsules by mouth to equal 1200mg  daily.  Modified Medications   No medications on file  Discontinued Medications   No medications on file    Review of Systems  Musculoskeletal: Positive for myalgias, joint swelling, arthralgias and gait problem.  Neurological: Positive for numbness.  All other systems reviewed and are negative.   Filed Vitals:   10/26/15 1456  BP: 130/72  Pulse: 78  Temp: 97.5 F (36.4 C)  TempSrc: Oral  Resp: 20  Height: 5\' 4"  (1.626 m)  Weight: 220 lb 9.6 oz (100.064 kg)  SpO2: 96%   Body mass index is 37.85 kg/(m^2).  Physical Exam  Constitutional: She is oriented to person, place, and time. She appears well-developed and well-nourished.  HENT:  Mouth/Throat: Oropharynx is clear and moist. No oropharyngeal exudate.  Eyes: Pupils are equal, round, and reactive to light. No scleral icterus.  Neck: Neck supple. Carotid bruit is not present. No tracheal deviation present. No thyromegaly present.  Cardiovascular: Normal rate, regular rhythm and intact distal pulses.  Exam reveals no gallop and no friction rub.   Murmur (1/6 SEM) heard. +1 pitting LE edema b/l. no calf TTP.   Pulmonary/Chest: Effort normal and breath sounds normal. No stridor. No respiratory distress. She has no wheezes. She has no rales.  Abdominal: Soft. Bowel sounds are normal. She exhibits no distension and no mass. There is no hepatomegaly. There is no tenderness. There is no rebound and no guarding.  Musculoskeletal: She exhibits edema and tenderness.  Unstable gait. Uses rolling walker  Lymphadenopathy:    She has no cervical adenopathy.  Neurological: She is alert and oriented to person, place, and time.  Skin: Skin is warm and dry. No rash noted.  Psychiatric: She has a normal mood and affect. Her behavior is normal. Judgment and thought content normal.     Labs reviewed: Admission on 08/18/2015, Discharged on 08/18/2015    Component Date Value Ref Range Status  . Sodium 08/18/2015 143  135 - 145 mmol/L Final  . Potassium 08/18/2015 3.7  3.5 - 5.1 mmol/L Final  . Chloride 08/18/2015 105  101 - 111 mmol/L Final  . CO2 08/18/2015 26  22 - 32 mmol/L Final  . Glucose, Bld 08/18/2015 83  65 - 99 mg/dL Final  . BUN 16/03/9603 12  6 - 20 mg/dL Final  . Creatinine, Ser 08/18/2015 0.92  0.44 - 1.00 mg/dL Final  . Calcium 54/02/8118 9.0  8.9 - 10.3 mg/dL Final  . GFR calc non Af Amer 08/18/2015 >60  >60 mL/min Final  . GFR calc Af Amer 08/18/2015 >60  >60 mL/min Final   Comment: (NOTE) The eGFR has been calculated using the CKD EPI equation. This calculation has not been validated in all clinical situations. eGFR's persistently <60 mL/min signify possible Chronic Kidney Disease.   . Anion gap 08/18/2015 12  5 - 15 Final  . WBC 08/18/2015 4.9  4.0 - 10.5 K/uL Final  . RBC 08/18/2015 4.68  3.87 - 5.11 MIL/uL Final  . Hemoglobin 08/18/2015 12.5  12.0 - 15.0 g/dL Final  . HCT 11/91/4782 40.0  36.0 - 46.0 % Final  . MCV 08/18/2015 85.5  78.0 - 100.0 fL Final  . MCH 08/18/2015 26.7  26.0 - 34.0 pg Final  . MCHC 08/18/2015 31.3  30.0 - 36.0 g/dL Final  . RDW 95/62/1308 13.1  11.5 - 15.5 % Final  . Platelets 08/18/2015 292  150 - 400 K/uL Final  . Troponin i, poc 08/18/2015 0.00  0.00 - 0.08 ng/mL Final  . Comment 3 08/18/2015          Final   Comment: Due to the release kinetics of cTnI, a negative result within the first hours of the onset of symptoms does not rule out myocardial infarction with certainty. If myocardial infarction is still suspected, repeat the test at appropriate intervals.   Admission on 08/03/2015, Discharged on 08/03/2015  Component Date Value Ref Range Status  . Sodium 08/03/2015 141  135 - 145 mmol/L Final  . Potassium 08/03/2015 4.3  3.5 - 5.1 mmol/L Final  . Chloride 08/03/2015 103  101 - 111 mmol/L Final  . BUN 08/03/2015 14  6 - 20 mg/dL Final  . Creatinine, Ser 08/03/2015 0.90   0.44 - 1.00 mg/dL Final  . Glucose, Bld 65/78/4696 80  65 - 99 mg/dL Final  . Calcium, Ion 29/52/8413 1.18  1.13 - 1.30 mmol/L Final  . TCO2 08/03/2015 30  0 - 100 mmol/L Final  . Hemoglobin 08/03/2015 14.6  12.0 - 15.0 g/dL Final  . HCT 24/40/1027 43.0  36.0 - 46.0 % Final    No results found.   Assessment/Plan   ICD-9-CM ICD-10-CM   1. Spinal stenosis, unspecified spinal region 724.00 M48.00   2. Encounter for medication monitoring V58.83 Z51.81 Hepatic Function Panel  3. Essential hypertension 401.9 I10   4. Idiopathic peripheral neuropathy (HCC) 356.9 G60.9   5. Recurrent falls V15.88 R29.6   6. Myalgia and myositis 729.1 M79.1     M60.9   7. Prediabetes - stable 790.29 R73.03    Increase citalopram to 1 tablet daily  Continue other medications as ordered  Will call with lab results  Follow up in 3 mos for routine visit  Melinda [name]  [facility] [address] [place], [place] [address] [phone] Cell (Monday-Friday 8 AM - 5 PM) [phone] After 5 PM and follow prompts

## 2015-10-26 NOTE — Patient Instructions (Addendum)
Increase citalopram to 1 tablet daily  Continue other medications as ordered  Will call with lab results  Follow up in 3 mos for routine visit

## 2015-10-27 LAB — HEPATIC FUNCTION PANEL
ALT: 9 IU/L (ref 0–32)
AST: 13 IU/L (ref 0–40)
Albumin: 4.3 g/dL (ref 3.6–4.8)
Alkaline Phosphatase: 82 IU/L (ref 39–117)
Bilirubin Total: 0.6 mg/dL (ref 0.0–1.2)
Bilirubin, Direct: 0.18 mg/dL (ref 0.00–0.40)
Total Protein: 7 g/dL (ref 6.0–8.5)

## 2015-11-12 ENCOUNTER — Telehealth: Payer: Self-pay | Admitting: *Deleted

## 2015-11-12 NOTE — Telephone Encounter (Signed)
Patient called and requested her Oxycodone Rx and a Loratadine Rx to be printed out for Wednesday. Informed patient that is was not due until Thursday. Patient explained that she only has a ride for Wednesday and asked if we could do it for then please. Stated that there was no other time she could get back here because no one would bring her. Informed her that we would print it this Wednesday and call her when it was ready for pick up. She agreed.

## 2015-11-14 ENCOUNTER — Other Ambulatory Visit: Payer: Self-pay

## 2015-11-14 DIAGNOSIS — M4800 Spinal stenosis, site unspecified: Secondary | ICD-10-CM

## 2015-11-14 DIAGNOSIS — IMO0001 Reserved for inherently not codable concepts without codable children: Secondary | ICD-10-CM

## 2015-11-14 MED ORDER — LORATADINE 10 MG PO TABS: 10.0000 mg | ORAL_TABLET | Freq: Every day | ORAL | Status: DC

## 2015-11-14 MED ORDER — OXYCODONE-ACETAMINOPHEN 5-325 MG PO TABS: ORAL_TABLET | ORAL | Status: DC

## 2015-11-20 ENCOUNTER — Telehealth: Payer: Self-pay

## 2015-11-20 NOTE — Telephone Encounter (Signed)
It sounds like she is having a panic attack. Recommend she go to urgent care for further eval. Also recommend f/u appt in the office after UC visit. She may need prn anxiolytic

## 2015-11-20 NOTE — Telephone Encounter (Signed)
Patient called c/o multiple symptoms- Patient with SOB, severe depression, nervousness in whole body, tightness in chest (states NOT pain), pain across the top of head. All symptoms x 24 hours except tightness in chest started this morning. Patient is taking all medications as prescribed. Patient not sure if she needs more depression medication. Patient usually takes depression mediation at night to help with rest. Patient questions if she should take in the am instead. Patient is currently in a shelter. Next available appointment is Thursday with ManX MAst, NP. Patient not sure if she needs to be seen, have mediations adjusted or go to ER/Urgent Care.   Patient would recommendations from Dr.Carter, please advise

## 2015-11-20 NOTE — Telephone Encounter (Signed)
Discussed with patient, patient verbalized understanding of Dr.Carter's response. Patient will call back to schedule follow-up with Dr.Carter

## 2015-11-29 ENCOUNTER — Telehealth: Payer: Self-pay

## 2015-11-29 NOTE — Telephone Encounter (Signed)
Patient left message on triage voicemail: I need a hover round or some type of motorized wheelchair. Patient with a decrease in mobility- legs week and arms and legs shaky (due to anxiety/nercousness).  I called patient back, unable to leave a message (mailbox full). I will try to call patient again later. Reason for call- inform patent that she needs an appointment in order to qualify for a hover round (face-to-face required).

## 2015-12-11 NOTE — Telephone Encounter (Signed)
Called patient.  Mail box is full, unable to leave a message.

## 2015-12-14 ENCOUNTER — Other Ambulatory Visit: Payer: Self-pay | Admitting: *Deleted

## 2015-12-14 DIAGNOSIS — M4800 Spinal stenosis, site unspecified: Secondary | ICD-10-CM

## 2015-12-14 DIAGNOSIS — IMO0001 Reserved for inherently not codable concepts without codable children: Secondary | ICD-10-CM

## 2015-12-14 MED ORDER — OXYCODONE-ACETAMINOPHEN 5-325 MG PO TABS: ORAL_TABLET | ORAL | Status: DC

## 2015-12-14 NOTE — Telephone Encounter (Signed)
Patient requested and will pick up 

## 2015-12-16 DIAGNOSIS — M545 Low back pain: Secondary | ICD-10-CM | POA: Diagnosis not present

## 2015-12-16 DIAGNOSIS — M6281 Muscle weakness (generalized): Secondary | ICD-10-CM | POA: Diagnosis not present

## 2015-12-16 DIAGNOSIS — R531 Weakness: Secondary | ICD-10-CM | POA: Diagnosis not present

## 2015-12-16 DIAGNOSIS — M5489 Other dorsalgia: Secondary | ICD-10-CM | POA: Diagnosis not present

## 2015-12-16 DIAGNOSIS — G8929 Other chronic pain: Secondary | ICD-10-CM | POA: Diagnosis not present

## 2015-12-20 ENCOUNTER — Other Ambulatory Visit: Payer: Self-pay | Admitting: *Deleted

## 2015-12-20 DIAGNOSIS — R062 Wheezing: Secondary | ICD-10-CM

## 2015-12-20 MED ORDER — ALBUTEROL SULFATE HFA 108 (90 BASE) MCG/ACT IN AERS: 2.0000 | INHALATION_SPRAY | Freq: Four times a day (QID) | RESPIRATORY_TRACT | Status: DC | PRN

## 2015-12-20 NOTE — Telephone Encounter (Signed)
Patient requested 90 day supply to be faxed to [facility]

## 2015-12-28 NOTE — Telephone Encounter (Signed)
Left message on voicemail informing patient to return call and schedule an appointment

## 2016-01-02 ENCOUNTER — Telehealth: Payer: Self-pay | Admitting: *Deleted

## 2016-01-02 NOTE — Telephone Encounter (Signed)
Ok to above 
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
## 2016-01-03 ENCOUNTER — Ambulatory Visit: Payer: Self-pay | Admitting: Nurse Practitioner

## 2016-01-03 DIAGNOSIS — R269 Unspecified abnormalities of gait and mobility: Secondary | ICD-10-CM | POA: Diagnosis not present

## 2016-01-03 NOTE — Telephone Encounter (Signed)
Called patient regarding a script for her rollator walker, she stated that she would pick it up today.

## 2016-01-10 ENCOUNTER — Telehealth: Payer: Self-pay

## 2016-01-10 NOTE — Telephone Encounter (Signed)
Patient called to stress that she [place] be on [place] [employer] bus tomorrow and she needs rx (Oxycodone) available by 9 am. Patient has several stops to make and the [employer] bus runs a tight schedule. Noted on RX log for triage assistant tomorrow

## 2016-01-11 ENCOUNTER — Other Ambulatory Visit: Payer: Self-pay | Admitting: *Deleted

## 2016-01-11 DIAGNOSIS — M4800 Spinal stenosis, site unspecified: Secondary | ICD-10-CM

## 2016-01-11 DIAGNOSIS — IMO0001 Reserved for inherently not codable concepts without codable children: Secondary | ICD-10-CM

## 2016-01-11 MED ORDER — OXYCODONE-ACETAMINOPHEN 5-325 MG PO TABS: ORAL_TABLET | ORAL | Status: DC

## 2016-01-11 NOTE — Telephone Encounter (Signed)
Patient requested and will pick up 

## 2016-01-22 ENCOUNTER — Other Ambulatory Visit: Payer: Self-pay | Admitting: Internal Medicine

## 2016-01-22 DIAGNOSIS — G629 Polyneuropathy, unspecified: Secondary | ICD-10-CM

## 2016-01-22 DIAGNOSIS — M4800 Spinal stenosis, site unspecified: Secondary | ICD-10-CM

## 2016-01-25 ENCOUNTER — Encounter: Payer: Self-pay | Admitting: Internal Medicine

## 2016-01-25 ENCOUNTER — Telehealth: Payer: Self-pay | Admitting: *Deleted

## 2016-01-25 ENCOUNTER — Ambulatory Visit (INDEPENDENT_AMBULATORY_CARE_PROVIDER_SITE_OTHER): Payer: Commercial Managed Care - HMO | Admitting: Internal Medicine

## 2016-01-25 VITALS — BP 142/78 | HR 68 | Temp 97.7°F | Ht 64.0 in | Wt 233.2 lb

## 2016-01-25 DIAGNOSIS — M797 Fibromyalgia: Secondary | ICD-10-CM

## 2016-01-25 DIAGNOSIS — M5430 Sciatica, unspecified side: Secondary | ICD-10-CM

## 2016-01-25 DIAGNOSIS — M2550 Pain in unspecified joint: Secondary | ICD-10-CM

## 2016-01-25 DIAGNOSIS — R2681 Unsteadiness on feet: Secondary | ICD-10-CM | POA: Diagnosis not present

## 2016-01-25 DIAGNOSIS — M4800 Spinal stenosis, site unspecified: Secondary | ICD-10-CM | POA: Diagnosis not present

## 2016-01-25 DIAGNOSIS — G894 Chronic pain syndrome: Secondary | ICD-10-CM | POA: Diagnosis not present

## 2016-01-25 DIAGNOSIS — F4321 Adjustment disorder with depressed mood: Secondary | ICD-10-CM

## 2016-01-25 DIAGNOSIS — G609 Hereditary and idiopathic neuropathy, unspecified: Secondary | ICD-10-CM

## 2016-01-25 DIAGNOSIS — R5381 Other malaise: Secondary | ICD-10-CM | POA: Diagnosis not present

## 2016-01-25 DIAGNOSIS — R7303 Prediabetes: Secondary | ICD-10-CM | POA: Diagnosis not present

## 2016-01-25 DIAGNOSIS — Z79899 Other long term (current) drug therapy: Secondary | ICD-10-CM

## 2016-01-25 LAB — CBC WITH DIFFERENTIAL/PLATELET
Basophils Absolute: 64 cells/uL (ref 0–200)
Basophils Relative: 1 %
Eosinophils Absolute: 384 cells/uL (ref 15–500)
Eosinophils Relative: 6 %
HCT: 36.7 % (ref 35.0–45.0)
Hemoglobin: 11.8 g/dL (ref 11.7–15.5)
Lymphocytes Relative: 45 %
Lymphs Abs: 2880 cells/uL (ref 850–3900)
MCH: 27.1 pg (ref 27.0–33.0)
MCHC: 32.2 g/dL (ref 32.0–36.0)
MCV: 84.4 fL (ref 80.0–100.0)
MPV: 11.4 fL (ref 7.5–12.5)
Monocytes Absolute: 768 cells/uL (ref 200–950)
Monocytes Relative: 12 %
Neutro Abs: 2304 cells/uL (ref 1500–7800)
Neutrophils Relative %: 36 %
Platelets: 315 10*3/uL (ref 140–400)
RBC: 4.35 MIL/uL (ref 3.80–5.10)
RDW: 13.6 % (ref 11.0–15.0)
WBC: 6.4 10*3/uL (ref 3.8–10.8)

## 2016-01-25 LAB — HEMOGLOBIN A1C
Hgb A1c MFr Bld: 5.6 % (ref <5.7)
Mean Plasma Glucose: 114 mg/dL

## 2016-01-25 MED ORDER — METHYLPREDNISOLONE ACETATE 40 MG/ML IJ SUSP
40.0000 mg | Freq: Once | INTRAMUSCULAR | Status: AC
  Administered 2016-01-25: 40 mg via INTRAMUSCULAR

## 2016-01-25 MED ORDER — TETANUS-DIPHTH-ACELL PERTUSSIS 5-2.5-18.5 LF-MCG/0.5 IM SUSP: 0.5000 mL | Freq: Once | INTRAMUSCULAR | 0 refills | Status: AC

## 2016-01-25 MED ORDER — OXYCODONE HCL 15 MG PO TABS: 15.0000 mg | ORAL_TABLET | ORAL | 0 refills | Status: DC | PRN

## 2016-01-25 MED ORDER — ZOSTER VACCINE LIVE 19400 UNT/0.65ML ~~LOC~~ SUSR: 0.6500 mL | Freq: Once | SUBCUTANEOUS | 0 refills | Status: AC

## 2016-01-25 MED ORDER — PREDNISONE 10 MG PO TABS: ORAL_TABLET | ORAL | 0 refills | Status: DC
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
## 2016-01-25 NOTE — Telephone Encounter (Signed)
Received fax paperwork from [facility] #[phone] Fax: [phone] regarding request for a reasonable accommodation for public housing. Request for grab bar on left side of toilet.  Given to Dr. Montez Morita to review and sign.

## 2016-01-25 NOTE — Patient Instructions (Addendum)
STOP percocet  START roxicodone as needed for pain.  Continue other medications as ordered  Will call with lab results and referrals  Depo-medrol injection given today  Start prednisone taper in the morning  Follow up in 3 mod for routine visit.

## 2016-01-26 LAB — COMPLETE METABOLIC PANEL WITH GFR
ALT: 7 U/L (ref 6–29)
AST: 12 U/L (ref 10–35)
Albumin: 3.8 g/dL (ref 3.6–5.1)
Alkaline Phosphatase: 68 U/L (ref 33–130)
BUN: 12 mg/dL (ref 7–25)
CO2: 28 mmol/L (ref 20–31)
Calcium: 8.8 mg/dL (ref 8.6–10.4)
Chloride: 104 mmol/L (ref 98–110)
Creat: 0.89 mg/dL (ref 0.50–0.99)
GFR, Est African American: 79 mL/min (ref >=60)
GFR, Est Non African American: 69 mL/min (ref >=60)
Glucose, Bld: 83 mg/dL (ref 65–99)
Potassium: 4.5 mmol/L (ref 3.5–5.3)
Sodium: 139 mmol/L (ref 135–146)
Total Bilirubin: 0.5 mg/dL (ref 0.2–1.2)
Total Protein: 6.4 g/dL (ref 6.1–8.1)

## 2016-01-26 LAB — URIC ACID: Uric Acid, Serum: 6.5 mg/dL (ref 2.5–7.0)

## 2016-01-31 ENCOUNTER — Encounter: Payer: Self-pay | Admitting: Internal Medicine

## 2016-02-05 ENCOUNTER — Other Ambulatory Visit: Payer: Self-pay | Admitting: Internal Medicine

## 2016-02-06 ENCOUNTER — Other Ambulatory Visit: Payer: Self-pay | Admitting: Internal Medicine

## 2016-02-06 DIAGNOSIS — F330 Major depressive disorder, recurrent, mild: Secondary | ICD-10-CM

## 2016-02-22 ENCOUNTER — Other Ambulatory Visit: Payer: Self-pay | Admitting: *Deleted

## 2016-02-22 DIAGNOSIS — M5430 Sciatica, unspecified side: Secondary | ICD-10-CM

## 2016-02-22 DIAGNOSIS — M4800 Spinal stenosis, site unspecified: Secondary | ICD-10-CM

## 2016-02-22 DIAGNOSIS — G609 Hereditary and idiopathic neuropathy, unspecified: Secondary | ICD-10-CM

## 2016-02-22 DIAGNOSIS — M797 Fibromyalgia: Secondary | ICD-10-CM

## 2016-02-22 DIAGNOSIS — G894 Chronic pain syndrome: Secondary | ICD-10-CM

## 2016-02-22 MED ORDER — OXYCODONE HCL 15 MG PO TABS: 15.0000 mg | ORAL_TABLET | ORAL | 0 refills | Status: DC | PRN

## 2016-02-22 NOTE — Telephone Encounter (Signed)
Patient requested and will pick up 

## 2016-03-08 ENCOUNTER — Other Ambulatory Visit: Payer: Self-pay | Admitting: Internal Medicine

## 2016-03-08 DIAGNOSIS — F330 Major depressive disorder, recurrent, mild: Secondary | ICD-10-CM

## 2016-03-24 ENCOUNTER — Other Ambulatory Visit: Payer: Self-pay | Admitting: *Deleted

## 2016-03-24 DIAGNOSIS — M797 Fibromyalgia: Secondary | ICD-10-CM

## 2016-03-24 DIAGNOSIS — M4800 Spinal stenosis, site unspecified: Secondary | ICD-10-CM

## 2016-03-24 DIAGNOSIS — M5430 Sciatica, unspecified side: Secondary | ICD-10-CM

## 2016-03-24 DIAGNOSIS — G894 Chronic pain syndrome: Secondary | ICD-10-CM

## 2016-03-24 DIAGNOSIS — G609 Hereditary and idiopathic neuropathy, unspecified: Secondary | ICD-10-CM

## 2016-03-24 MED ORDER — OXYCODONE HCL 15 MG PO TABS: 15.0000 mg | ORAL_TABLET | ORAL | 0 refills | Status: DC | PRN

## 2016-03-24 NOTE — Telephone Encounter (Signed)
Patient requested and will pick up 

## 2016-03-27 ENCOUNTER — Emergency Department (HOSPITAL_COMMUNITY)
Admission: EM | Admit: 2016-03-27 | Discharge: 2016-03-27 | Disposition: A | Discharge status: Home / Self Care | Payer: Commercial Managed Care - HMO | Attending: Emergency Medicine | Admitting: Emergency Medicine

## 2016-03-27 ENCOUNTER — Encounter (HOSPITAL_COMMUNITY): Payer: Self-pay | Admitting: Emergency Medicine

## 2016-03-27 ENCOUNTER — Emergency Department (HOSPITAL_COMMUNITY): Payer: Commercial Managed Care - HMO

## 2016-03-27 ENCOUNTER — Other Ambulatory Visit: Payer: Self-pay | Admitting: Internal Medicine

## 2016-03-27 DIAGNOSIS — M25561 Pain in right knee: Secondary | ICD-10-CM | POA: Diagnosis not present

## 2016-03-27 DIAGNOSIS — M545 Low back pain: Secondary | ICD-10-CM | POA: Diagnosis not present

## 2016-03-27 DIAGNOSIS — I10 Essential (primary) hypertension: Secondary | ICD-10-CM | POA: Insufficient documentation

## 2016-03-27 DIAGNOSIS — X509XXA Other and unspecified overexertion or strenuous movements or postures, initial encounter: Secondary | ICD-10-CM | POA: Diagnosis not present

## 2016-03-27 DIAGNOSIS — Y929 Unspecified place or not applicable: Secondary | ICD-10-CM | POA: Insufficient documentation

## 2016-03-27 DIAGNOSIS — M25552 Pain in left hip: Secondary | ICD-10-CM | POA: Diagnosis not present

## 2016-03-27 DIAGNOSIS — Z7982 Long term (current) use of aspirin: Secondary | ICD-10-CM | POA: Diagnosis not present

## 2016-03-27 DIAGNOSIS — S76012A Strain of muscle, fascia and tendon of left hip, initial encounter: Secondary | ICD-10-CM | POA: Insufficient documentation

## 2016-03-27 DIAGNOSIS — T148XXA Other injury of unspecified body region, initial encounter: Secondary | ICD-10-CM

## 2016-03-27 DIAGNOSIS — M79605 Pain in left leg: Secondary | ICD-10-CM | POA: Diagnosis not present

## 2016-03-27 DIAGNOSIS — M5442 Lumbago with sciatica, left side: Secondary | ICD-10-CM | POA: Insufficient documentation

## 2016-03-27 DIAGNOSIS — T148 Other injury of unspecified body region: Secondary | ICD-10-CM | POA: Diagnosis not present

## 2016-03-27 DIAGNOSIS — J45909 Unspecified asthma, uncomplicated: Secondary | ICD-10-CM | POA: Insufficient documentation

## 2016-03-27 DIAGNOSIS — Y939 Activity, unspecified: Secondary | ICD-10-CM | POA: Diagnosis not present

## 2016-03-27 DIAGNOSIS — S39012A Strain of muscle, fascia and tendon of lower back, initial encounter: Secondary | ICD-10-CM | POA: Insufficient documentation

## 2016-03-27 DIAGNOSIS — S3992XA Unspecified injury of lower back, initial encounter: Secondary | ICD-10-CM | POA: Diagnosis present

## 2016-03-27 DIAGNOSIS — Y999 Unspecified external cause status: Secondary | ICD-10-CM | POA: Insufficient documentation

## 2016-03-27 DIAGNOSIS — M5432 Sciatica, left side: Secondary | ICD-10-CM

## 2016-03-27 MED ORDER — CYCLOBENZAPRINE HCL 5 MG PO TABS: 10.0000 mg | ORAL_TABLET | Freq: Three times a day (TID) | ORAL | 0 refills | Status: DC | PRN

## 2016-03-27 MED ORDER — METHOCARBAMOL 500 MG PO TABS
500.0000 mg | ORAL_TABLET | Freq: Once | ORAL | Status: AC
  Administered 2016-03-27: 500 mg via ORAL
  Filled 2016-03-27: qty 1

## 2016-03-27 MED ORDER — IBUPROFEN 600 MG PO TABS: 600.0000 mg | ORAL_TABLET | Freq: Four times a day (QID) | ORAL | 0 refills | Status: DC | PRN

## 2016-03-27 NOTE — Progress Notes (Signed)
CSW provided patient with taxi voucher.   Trinitee Horgan, LCSWA Clinical Social Worker [phone]  

## 2016-03-27 NOTE — Discharge Instructions (Signed)
Take medications as needed for pain.  Please follow up with your primary care provider for discussion of today's diagnosis.  Return to ER for new or worsening symptoms, any additional concerns.

## 2016-03-27 NOTE — Care Management Note (Signed)
Case Management Note  Patient Details  Name: Melinda [name] MRN: [id number] Date of Birth: 30-Mar-1951  Subjective/Objective:                   65 y.o. female with a hx of sciatica, HTN, paresthesia, fibromyalgia, and peripheral neuropathy who presents to the Emergency Department complaining of sudden onset of gradually worsening moderate pain to the lateral aspect of her hip that radiates down the lateral aspect of her left leg onset 2 hours ago. From home alone.  Action/Plan: Follow for disposition needs.   Expected Discharge Date:  03/27/16               Expected Discharge Plan:     In-House Referral:     Discharge planning Services     Post Acute Care Choice:    Choice offered to:     DME Arranged:    DME Agency:     HH Arranged:    HH Agency:     Status of Service:     If discussed at [employer], dates discussed:    Additional Comments: Home Health RN/PT set up through [facility] [phone].  Oletta Cohn, RN 03/27/2016, 3:32 PM
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
## 2016-03-27 NOTE — Progress Notes (Addendum)
EDCM spoke to patient at bedside.  Per chart review, patient has chosen [facility] for home health services.  Patient requesting a wheelchair.  EDCM called and spoke to [facility] who will bring wheelchair to ED to patient bedside.  EDCM discussed patient with EDPA who added hh RN and aide to services.  EDCM discussed safe transport home.  Patient reports no one in her family drives.  She reports the entrance to her home is flat and she has an Engineer, structural.  EDCM discussed with EDSW who will bring patient a cab voucher.  Patient reports her grandson will meet her at home to assist her in getting into her home.  Patient very thankful for services.  No further EDCM needs at this time.  1611pm  EDCM called home health referral in to Tim of [facility].

## 2016-03-31 DIAGNOSIS — R269 Unspecified abnormalities of gait and mobility: Secondary | ICD-10-CM | POA: Diagnosis not present

## 2016-03-31 DIAGNOSIS — I5042 Chronic combined systolic (congestive) and diastolic (congestive) heart failure: Secondary | ICD-10-CM | POA: Diagnosis not present

## 2016-04-01 DIAGNOSIS — G894 Chronic pain syndrome: Secondary | ICD-10-CM | POA: Diagnosis not present

## 2016-04-01 DIAGNOSIS — I10 Essential (primary) hypertension: Secondary | ICD-10-CM | POA: Diagnosis not present

## 2016-04-01 DIAGNOSIS — G609 Hereditary and idiopathic neuropathy, unspecified: Secondary | ICD-10-CM | POA: Diagnosis not present

## 2016-04-01 DIAGNOSIS — M5430 Sciatica, unspecified side: Secondary | ICD-10-CM | POA: Diagnosis not present

## 2016-04-01 DIAGNOSIS — R296 Repeated falls: Secondary | ICD-10-CM | POA: Diagnosis not present

## 2016-04-01 DIAGNOSIS — M1990 Unspecified osteoarthritis, unspecified site: Secondary | ICD-10-CM | POA: Diagnosis not present

## 2016-04-02 DIAGNOSIS — I10 Essential (primary) hypertension: Secondary | ICD-10-CM | POA: Diagnosis not present

## 2016-04-02 DIAGNOSIS — R296 Repeated falls: Secondary | ICD-10-CM | POA: Diagnosis not present

## 2016-04-02 DIAGNOSIS — M5430 Sciatica, unspecified side: Secondary | ICD-10-CM | POA: Diagnosis not present

## 2016-04-02 DIAGNOSIS — G609 Hereditary and idiopathic neuropathy, unspecified: Secondary | ICD-10-CM | POA: Diagnosis not present

## 2016-04-02 DIAGNOSIS — G894 Chronic pain syndrome: Secondary | ICD-10-CM | POA: Diagnosis not present

## 2016-04-02 DIAGNOSIS — M1990 Unspecified osteoarthritis, unspecified site: Secondary | ICD-10-CM | POA: Diagnosis not present

## 2016-04-04 ENCOUNTER — Telehealth: Payer: Self-pay

## 2016-04-04 NOTE — Telephone Encounter (Signed)
Verbal order request for Physical Therapy 3 x weekly x 1 week, then 2 x weekly x 3 weeks.  Per [employer] standing order, verbal order given. Message will be sent to patient's provider as a FYI.

## 2016-04-05 ENCOUNTER — Other Ambulatory Visit: Payer: Self-pay | Admitting: Internal Medicine

## 2016-04-05 DIAGNOSIS — F330 Major depressive disorder, recurrent, mild: Secondary | ICD-10-CM

## 2016-04-07 DIAGNOSIS — G894 Chronic pain syndrome: Secondary | ICD-10-CM | POA: Diagnosis not present

## 2016-04-07 DIAGNOSIS — M5430 Sciatica, unspecified side: Secondary | ICD-10-CM | POA: Diagnosis not present

## 2016-04-07 DIAGNOSIS — G609 Hereditary and idiopathic neuropathy, unspecified: Secondary | ICD-10-CM | POA: Diagnosis not present

## 2016-04-07 DIAGNOSIS — R296 Repeated falls: Secondary | ICD-10-CM | POA: Diagnosis not present

## 2016-04-07 DIAGNOSIS — M1990 Unspecified osteoarthritis, unspecified site: Secondary | ICD-10-CM | POA: Diagnosis not present

## 2016-04-07 DIAGNOSIS — I10 Essential (primary) hypertension: Secondary | ICD-10-CM | POA: Diagnosis not present

## 2016-04-09 DIAGNOSIS — I10 Essential (primary) hypertension: Secondary | ICD-10-CM | POA: Diagnosis not present

## 2016-04-09 DIAGNOSIS — M5430 Sciatica, unspecified side: Secondary | ICD-10-CM | POA: Diagnosis not present

## 2016-04-09 DIAGNOSIS — G894 Chronic pain syndrome: Secondary | ICD-10-CM | POA: Diagnosis not present

## 2016-04-09 DIAGNOSIS — M1990 Unspecified osteoarthritis, unspecified site: Secondary | ICD-10-CM | POA: Diagnosis not present

## 2016-04-09 DIAGNOSIS — R296 Repeated falls: Secondary | ICD-10-CM | POA: Diagnosis not present

## 2016-04-09 DIAGNOSIS — G609 Hereditary and idiopathic neuropathy, unspecified: Secondary | ICD-10-CM | POA: Diagnosis not present

## 2016-04-11 DIAGNOSIS — R296 Repeated falls: Secondary | ICD-10-CM | POA: Diagnosis not present

## 2016-04-11 DIAGNOSIS — M5430 Sciatica, unspecified side: Secondary | ICD-10-CM | POA: Diagnosis not present

## 2016-04-11 DIAGNOSIS — G894 Chronic pain syndrome: Secondary | ICD-10-CM | POA: Diagnosis not present

## 2016-04-11 DIAGNOSIS — M1990 Unspecified osteoarthritis, unspecified site: Secondary | ICD-10-CM | POA: Diagnosis not present

## 2016-04-11 DIAGNOSIS — I10 Essential (primary) hypertension: Secondary | ICD-10-CM | POA: Diagnosis not present

## 2016-04-11 DIAGNOSIS — G609 Hereditary and idiopathic neuropathy, unspecified: Secondary | ICD-10-CM | POA: Diagnosis not present

## 2016-04-14 DIAGNOSIS — I10 Essential (primary) hypertension: Secondary | ICD-10-CM | POA: Diagnosis not present

## 2016-04-14 DIAGNOSIS — M5430 Sciatica, unspecified side: Secondary | ICD-10-CM | POA: Diagnosis not present

## 2016-04-14 DIAGNOSIS — G894 Chronic pain syndrome: Secondary | ICD-10-CM | POA: Diagnosis not present

## 2016-04-14 DIAGNOSIS — G609 Hereditary and idiopathic neuropathy, unspecified: Secondary | ICD-10-CM | POA: Diagnosis not present

## 2016-04-14 DIAGNOSIS — M1990 Unspecified osteoarthritis, unspecified site: Secondary | ICD-10-CM | POA: Diagnosis not present

## 2016-04-14 DIAGNOSIS — R296 Repeated falls: Secondary | ICD-10-CM | POA: Diagnosis not present

## 2016-04-17 DIAGNOSIS — M5430 Sciatica, unspecified side: Secondary | ICD-10-CM | POA: Diagnosis not present

## 2016-04-17 DIAGNOSIS — I10 Essential (primary) hypertension: Secondary | ICD-10-CM | POA: Diagnosis not present

## 2016-04-17 DIAGNOSIS — G609 Hereditary and idiopathic neuropathy, unspecified: Secondary | ICD-10-CM | POA: Diagnosis not present

## 2016-04-17 DIAGNOSIS — M1990 Unspecified osteoarthritis, unspecified site: Secondary | ICD-10-CM | POA: Diagnosis not present

## 2016-04-17 DIAGNOSIS — R296 Repeated falls: Secondary | ICD-10-CM | POA: Diagnosis not present

## 2016-04-17 DIAGNOSIS — G894 Chronic pain syndrome: Secondary | ICD-10-CM | POA: Diagnosis not present

## 2016-04-18 ENCOUNTER — Other Ambulatory Visit: Payer: Self-pay | Admitting: Internal Medicine

## 2016-04-23 ENCOUNTER — Other Ambulatory Visit: Payer: Self-pay | Admitting: *Deleted

## 2016-04-23 ENCOUNTER — Ambulatory Visit: Payer: Commercial Managed Care - HMO | Admitting: Internal Medicine

## 2016-04-23 DIAGNOSIS — G609 Hereditary and idiopathic neuropathy, unspecified: Secondary | ICD-10-CM

## 2016-04-23 DIAGNOSIS — M5430 Sciatica, unspecified side: Secondary | ICD-10-CM

## 2016-04-23 DIAGNOSIS — G894 Chronic pain syndrome: Secondary | ICD-10-CM

## 2016-04-23 DIAGNOSIS — M797 Fibromyalgia: Secondary | ICD-10-CM

## 2016-04-23 DIAGNOSIS — M4800 Spinal stenosis, site unspecified: Secondary | ICD-10-CM

## 2016-04-23 MED ORDER — OXYCODONE HCL 15 MG PO TABS: 15.0000 mg | ORAL_TABLET | ORAL | 0 refills | Status: DC | PRN

## 2016-04-23 NOTE — Telephone Encounter (Signed)
Patient requested and Melinda [name] will pick up

## 2016-04-23 NOTE — Telephone Encounter (Signed)
Called patient left voicemail for prescription to be picked up

## 2016-05-01 DIAGNOSIS — R269 Unspecified abnormalities of gait and mobility: Secondary | ICD-10-CM | POA: Diagnosis not present

## 2016-05-01 DIAGNOSIS — I5042 Chronic combined systolic (congestive) and diastolic (congestive) heart failure: Secondary | ICD-10-CM | POA: Diagnosis not present

## 2016-05-05 ENCOUNTER — Telehealth: Payer: Self-pay | Admitting: *Deleted

## 2016-05-05 NOTE — Telephone Encounter (Signed)
Flora with [facility] called and requested verbal order for Reassessment for PT. Verbal order given.

## 2016-05-07 ENCOUNTER — Telehealth: Payer: Self-pay | Admitting: Internal Medicine

## 2016-05-07 DIAGNOSIS — M1990 Unspecified osteoarthritis, unspecified site: Secondary | ICD-10-CM | POA: Diagnosis not present

## 2016-05-07 DIAGNOSIS — M5430 Sciatica, unspecified side: Secondary | ICD-10-CM | POA: Diagnosis not present

## 2016-05-07 DIAGNOSIS — G894 Chronic pain syndrome: Secondary | ICD-10-CM | POA: Diagnosis not present

## 2016-05-07 DIAGNOSIS — G609 Hereditary and idiopathic neuropathy, unspecified: Secondary | ICD-10-CM | POA: Diagnosis not present

## 2016-05-07 DIAGNOSIS — R296 Repeated falls: Secondary | ICD-10-CM | POA: Diagnosis not present

## 2016-05-07 DIAGNOSIS — I10 Essential (primary) hypertension: Secondary | ICD-10-CM | POA: Diagnosis not present

## 2016-05-07 NOTE — Telephone Encounter (Signed)
Patient called and confirmed appointment

## 2016-05-07 NOTE — Telephone Encounter (Signed)
left msg asking pt to confirm this AWV appt w/ nurse. VDM (DD) °

## 2016-05-08 DIAGNOSIS — G609 Hereditary and idiopathic neuropathy, unspecified: Secondary | ICD-10-CM | POA: Diagnosis not present

## 2016-05-08 DIAGNOSIS — R296 Repeated falls: Secondary | ICD-10-CM | POA: Diagnosis not present

## 2016-05-08 DIAGNOSIS — M1990 Unspecified osteoarthritis, unspecified site: Secondary | ICD-10-CM | POA: Diagnosis not present

## 2016-05-08 DIAGNOSIS — M5430 Sciatica, unspecified side: Secondary | ICD-10-CM | POA: Diagnosis not present

## 2016-05-08 DIAGNOSIS — I10 Essential (primary) hypertension: Secondary | ICD-10-CM | POA: Diagnosis not present

## 2016-05-08 DIAGNOSIS — G894 Chronic pain syndrome: Secondary | ICD-10-CM | POA: Diagnosis not present

## 2016-05-09 ENCOUNTER — Telehealth: Payer: Self-pay | Admitting: *Deleted

## 2016-05-09 NOTE — Telephone Encounter (Signed)
Flora with [facility] called and requested a verbal order for PT 2x3wks. Verbal order given.

## 2016-05-13 DIAGNOSIS — G609 Hereditary and idiopathic neuropathy, unspecified: Secondary | ICD-10-CM | POA: Diagnosis not present

## 2016-05-13 DIAGNOSIS — M5430 Sciatica, unspecified side: Secondary | ICD-10-CM | POA: Diagnosis not present

## 2016-05-13 DIAGNOSIS — I10 Essential (primary) hypertension: Secondary | ICD-10-CM | POA: Diagnosis not present

## 2016-05-13 DIAGNOSIS — M1990 Unspecified osteoarthritis, unspecified site: Secondary | ICD-10-CM | POA: Diagnosis not present

## 2016-05-13 DIAGNOSIS — R296 Repeated falls: Secondary | ICD-10-CM | POA: Diagnosis not present

## 2016-05-13 DIAGNOSIS — G894 Chronic pain syndrome: Secondary | ICD-10-CM | POA: Diagnosis not present

## 2016-05-14 DIAGNOSIS — I10 Essential (primary) hypertension: Secondary | ICD-10-CM | POA: Diagnosis not present

## 2016-05-14 DIAGNOSIS — M5430 Sciatica, unspecified side: Secondary | ICD-10-CM | POA: Diagnosis not present

## 2016-05-14 DIAGNOSIS — R296 Repeated falls: Secondary | ICD-10-CM | POA: Diagnosis not present

## 2016-05-14 DIAGNOSIS — M1990 Unspecified osteoarthritis, unspecified site: Secondary | ICD-10-CM | POA: Diagnosis not present

## 2016-05-14 DIAGNOSIS — G609 Hereditary and idiopathic neuropathy, unspecified: Secondary | ICD-10-CM | POA: Diagnosis not present

## 2016-05-14 DIAGNOSIS — G894 Chronic pain syndrome: Secondary | ICD-10-CM | POA: Diagnosis not present

## 2016-05-15 ENCOUNTER — Telehealth: Payer: Self-pay | Admitting: *Deleted

## 2016-05-15 NOTE — Telephone Encounter (Signed)
Received a fax from [facility] #[phone] requesting order to be signed for Anti tipping device, cushion, elevating leg rest, Brake extension, Wheelchair. Order placed for Dr. Montez Morita to review and sign. To be faxed back to Fax:[phone] Printed last OV and attached.

## 2016-05-20 DIAGNOSIS — G894 Chronic pain syndrome: Secondary | ICD-10-CM | POA: Diagnosis not present

## 2016-05-20 DIAGNOSIS — I10 Essential (primary) hypertension: Secondary | ICD-10-CM | POA: Diagnosis not present

## 2016-05-20 DIAGNOSIS — G609 Hereditary and idiopathic neuropathy, unspecified: Secondary | ICD-10-CM | POA: Diagnosis not present

## 2016-05-20 DIAGNOSIS — M5430 Sciatica, unspecified side: Secondary | ICD-10-CM | POA: Diagnosis not present

## 2016-05-20 DIAGNOSIS — R296 Repeated falls: Secondary | ICD-10-CM | POA: Diagnosis not present

## 2016-05-20 DIAGNOSIS — M1990 Unspecified osteoarthritis, unspecified site: Secondary | ICD-10-CM | POA: Diagnosis not present

## 2016-05-23 DIAGNOSIS — I10 Essential (primary) hypertension: Secondary | ICD-10-CM | POA: Diagnosis not present

## 2016-05-23 DIAGNOSIS — R296 Repeated falls: Secondary | ICD-10-CM | POA: Diagnosis not present

## 2016-05-23 DIAGNOSIS — M1990 Unspecified osteoarthritis, unspecified site: Secondary | ICD-10-CM | POA: Diagnosis not present

## 2016-05-23 DIAGNOSIS — M5430 Sciatica, unspecified side: Secondary | ICD-10-CM | POA: Diagnosis not present

## 2016-05-23 DIAGNOSIS — G609 Hereditary and idiopathic neuropathy, unspecified: Secondary | ICD-10-CM | POA: Diagnosis not present

## 2016-05-23 DIAGNOSIS — G894 Chronic pain syndrome: Secondary | ICD-10-CM | POA: Diagnosis not present

## 2016-05-26 ENCOUNTER — Other Ambulatory Visit: Payer: Self-pay | Admitting: *Deleted

## 2016-05-26 DIAGNOSIS — G894 Chronic pain syndrome: Secondary | ICD-10-CM

## 2016-05-26 DIAGNOSIS — G609 Hereditary and idiopathic neuropathy, unspecified: Secondary | ICD-10-CM

## 2016-05-26 DIAGNOSIS — M797 Fibromyalgia: Secondary | ICD-10-CM

## 2016-05-26 DIAGNOSIS — F330 Major depressive disorder, recurrent, mild: Secondary | ICD-10-CM

## 2016-05-26 DIAGNOSIS — M4800 Spinal stenosis, site unspecified: Secondary | ICD-10-CM

## 2016-05-26 DIAGNOSIS — M5430 Sciatica, unspecified side: Secondary | ICD-10-CM

## 2016-05-26 MED ORDER — CITALOPRAM HYDROBROMIDE 10 MG PO TABS: 10.0000 mg | ORAL_TABLET | Freq: Every day | ORAL | 3 refills | Status: DC

## 2016-05-26 MED ORDER — OXYCODONE HCL 15 MG PO TABS: 15.0000 mg | ORAL_TABLET | ORAL | 0 refills | Status: DC | PRN

## 2016-05-26 NOTE — Telephone Encounter (Signed)
Patient requested Citalopram #90 to be sent to [facility]. Patient will pick up other Rx.

## 2016-05-27 ENCOUNTER — Encounter: Payer: Self-pay | Admitting: Internal Medicine

## 2016-05-27 DIAGNOSIS — M5430 Sciatica, unspecified side: Secondary | ICD-10-CM | POA: Diagnosis not present

## 2016-05-27 DIAGNOSIS — M1990 Unspecified osteoarthritis, unspecified site: Secondary | ICD-10-CM | POA: Diagnosis not present

## 2016-05-27 DIAGNOSIS — G894 Chronic pain syndrome: Secondary | ICD-10-CM | POA: Diagnosis not present

## 2016-05-27 DIAGNOSIS — I10 Essential (primary) hypertension: Secondary | ICD-10-CM | POA: Diagnosis not present

## 2016-05-27 DIAGNOSIS — R296 Repeated falls: Secondary | ICD-10-CM | POA: Diagnosis not present

## 2016-05-27 DIAGNOSIS — G609 Hereditary and idiopathic neuropathy, unspecified: Secondary | ICD-10-CM | POA: Diagnosis not present

## 2016-05-28 ENCOUNTER — Ambulatory Visit (INDEPENDENT_AMBULATORY_CARE_PROVIDER_SITE_OTHER): Payer: Commercial Managed Care - HMO | Admitting: Internal Medicine

## 2016-05-28 ENCOUNTER — Ambulatory Visit (INDEPENDENT_AMBULATORY_CARE_PROVIDER_SITE_OTHER): Payer: Commercial Managed Care - HMO

## 2016-05-28 VITALS — BP 106/58 | HR 58 | Temp 97.9°F | Ht 63.0 in | Wt 240.8 lb

## 2016-05-28 VITALS — BP 106/58 | HR 58 | Temp 97.9°F | Ht 63.0 in | Wt 240.7 lb

## 2016-05-28 DIAGNOSIS — Z79899 Other long term (current) drug therapy: Secondary | ICD-10-CM

## 2016-05-28 DIAGNOSIS — Z0000 Encounter for general adult medical examination without abnormal findings: Secondary | ICD-10-CM

## 2016-05-28 DIAGNOSIS — Z23 Encounter for immunization: Secondary | ICD-10-CM | POA: Diagnosis not present

## 2016-05-28 DIAGNOSIS — M4800 Spinal stenosis, site unspecified: Secondary | ICD-10-CM | POA: Diagnosis not present

## 2016-05-28 DIAGNOSIS — M5430 Sciatica, unspecified side: Secondary | ICD-10-CM

## 2016-05-28 DIAGNOSIS — I10 Essential (primary) hypertension: Secondary | ICD-10-CM | POA: Diagnosis not present

## 2016-05-28 DIAGNOSIS — M797 Fibromyalgia: Secondary | ICD-10-CM

## 2016-05-28 DIAGNOSIS — F332 Major depressive disorder, recurrent severe without psychotic features: Secondary | ICD-10-CM

## 2016-05-28 DIAGNOSIS — M2550 Pain in unspecified joint: Secondary | ICD-10-CM

## 2016-05-28 DIAGNOSIS — R2681 Unsteadiness on feet: Secondary | ICD-10-CM | POA: Diagnosis not present

## 2016-05-28 DIAGNOSIS — G609 Hereditary and idiopathic neuropathy, unspecified: Secondary | ICD-10-CM | POA: Diagnosis not present

## 2016-05-28 DIAGNOSIS — Z136 Encounter for screening for cardiovascular disorders: Secondary | ICD-10-CM | POA: Diagnosis not present

## 2016-05-28 LAB — COMPLETE METABOLIC PANEL WITH GFR
ALT: 8 U/L (ref 6–29)
AST: 14 U/L (ref 10–35)
Albumin: 3.7 g/dL (ref 3.6–5.1)
Alkaline Phosphatase: 64 U/L (ref 33–130)
BUN: 10 mg/dL (ref 7–25)
CO2: 30 mmol/L (ref 20–31)
Calcium: 9 mg/dL (ref 8.6–10.4)
Chloride: 105 mmol/L (ref 98–110)
Creat: 0.79 mg/dL (ref 0.50–0.99)
GFR, Est African American: >89 mL/min (ref >=60)
GFR, Est Non African American: 79 mL/min (ref >=60)
Glucose, Bld: 78 mg/dL (ref 65–99)
Potassium: 4.4 mmol/L (ref 3.5–5.3)
Sodium: 139 mmol/L (ref 135–146)
Total Bilirubin: 0.5 mg/dL (ref 0.2–1.2)
Total Protein: 6.4 g/dL (ref 6.1–8.1)

## 2016-05-28 LAB — LIPID PANEL
Cholesterol: 174 mg/dL (ref <200)
HDL: 54 mg/dL (ref >50)
LDL Cholesterol: 101 mg/dL — ABNORMAL HIGH (ref <100)
Total CHOL/HDL Ratio: 3.2 Ratio (ref <5.0)
Triglycerides: 96 mg/dL (ref <150)
VLDL: 19 mg/dL (ref <30)

## 2016-05-28 LAB — TSH: TSH: 1.37 mIU/L

## 2016-05-28 MED ORDER — CITALOPRAM HYDROBROMIDE 20 MG PO TABS: 20.0000 mg | ORAL_TABLET | Freq: Every day | ORAL | 3 refills | Status: DC

## 2016-05-28 NOTE — Patient Instructions (Signed)
Increase citalopram to 20 mg daily  Continue other medications as ordered  Will call with lab results  Follow up in 1 month for depression

## 2016-05-28 NOTE — Progress Notes (Signed)
Quick Notes   Health Maintenance:   Pt received flu shot today. Would like a referral MMG and Colonoscopy. Refused Shingles Vaccine.     Abnormal Screen:  None; MMSE-27/30 Passed Clock Test; PQ-9 was 17 today. Showed signs of depression (not happy with living arrangements, does not socialize (the people in her building "are not my crowd."), financial problems, states pain is everywhere and constant.t   Patient Concerns:   None    Nurse Concerns:   Depression (taking Celexa)

## 2016-05-28 NOTE — Progress Notes (Signed)
Subjective:   Melinda [name] is a 65 y.o. female who presents for an Initial Medicare Annual Wellness Visit.  Review of Systems     Cardiac Risk Factors include: advanced age (>90men, >6 women);hypertension;family history of premature cardiovascular disease;sedentary lifestyle;obesity (BMI >30kg/m2)     Objective:    Today's Vitals   05/28/16 1455 05/28/16 1509  BP: (!) 106/58   Pulse: (!) 58   Temp: 97.9 F (36.6 C)   TempSrc: Oral   SpO2: 99%   Weight: 240 lb 12.8 oz (109.2 kg)   Height: 5\' 3"  (1.6 m)   PainSc: 8  8    Body mass index is 42.66 kg/m.   Current Medications (verified) Outpatient Encounter Prescriptions as of 05/28/2016  Medication Sig  . albuterol (PROVENTIL HFA;VENTOLIN HFA) 108 (90 Base) MCG/ACT inhaler Inhale 2 puffs into the lungs every 6 (six) hours as needed for wheezing or shortness of breath.  05/30/2016 amitriptyline (ELAVIL) 25 MG tablet TAKE TWO TABLETS BY MOUTH AT BEDTIME FOR REST  . aspirin 81 MG tablet Take 81 mg by mouth daily.  . celecoxib (CELEBREX) 100 MG capsule TAKE ONE CAPSULE TWICE DAILY FOR PAINS  . citalopram (CELEXA) 10 MG tablet Take 1 tablet (10 mg total) by mouth daily.  . cyclobenzaprine (FLEXERIL) 5 MG tablet Take 2 tablets (10 mg total) by mouth 3 (three) times daily as needed for muscle spasms.  Marland Kitchen doxepin (SINEQUAN) 50 MG capsule TAKE 2 CAPSULES (100 MG TOTAL) BY MOUTH AT BEDTIME.  . EQ LORATADINE 10 MG tablet TAKE ONE TABLET BY MOUTH ONCE DAILY  . fluticasone (FLONASE) 50 MCG/ACT nasal spray Place 2 sprays into both nostrils daily as needed for allergies or rhinitis.  Marland Kitchen gabapentin (NEURONTIN) 400 MG capsule TAKE THREE CAPSULES BY MOUTH TWICE DAILY FOR PAINS  . ibuprofen (ADVIL,MOTRIN) 600 MG tablet Take 1 tablet (600 mg total) by mouth every 6 (six) hours as needed.  . methocarbamol (ROBAXIN) 500 MG tablet TAKE ONE TABLET EVERY 8 HOURS AS NEEDED FOR MUSCLE SPASMS  . metoprolol (LOPRESSOR) 50 MG tablet TAKE ONE TABLET BY MOUTH TWICE  DAILY FOR BLOOD PRESSURE  . oxyCODONE (ROXICODONE) 15 MG immediate release tablet Take 1 tablet (15 mg total) by mouth every 4 (four) hours as needed for pain.  . vitamin D, CHOLECALCIFEROL, 400 UNITS tablet Take by mouth daily. Take three  Capsules by mouth to equal 1200mg  daily.  . [DISCONTINUED] loratadine (EQ LORATADINE) 10 MG tablet Take 1 tablet (10 mg total) by mouth daily.  . [DISCONTINUED] predniSONE (DELTASONE) 10 MG tablet Take 4 tabs po daily x 3 days then 3 tabs po daily x 3 days then 2 tabs po daily x 3 days then 1 tab po daily x 3 days and stop   No facility-administered encounter medications on file as of 05/28/2016.     Allergies (verified) Patient has no known allergies.   History: Past Medical History:  Diagnosis Date  . Asthma   . Bursitis   . Depressive disorder, not elsewhere classified   . Enthesopathy of hip region   . Essential hypertension, benign   . Muscle weakness (generalized)   . Myalgia and myositis, unspecified   . Other abnormal blood chemistry   . Other malaise and fatigue   . Personal history of arthritis   . Personal history of fall   . Raynaud's syndrome   . Sciatica   . Unspecified vitamin D deficiency    Past Surgical History:  Procedure Laterality Date  .  ABDOMINAL HYSTERECTOMY  1985   Family History  Problem Relation Age of Onset  . Dementia Mother   . Heart disease Mother   . Hypertension Mother   . Diabetes Mother   . Diabetes Sister   . Cancer Sister     lung  . Cancer Other     Nepher (Mothers side)  . Diabetes Maternal Grandmother   . Arthritis Maternal Grandmother   . Diabetes Cousin     Mother's side  . Arthritis Sister   . Arthritis Cousin     Mother's side    Social History   Occupational History  .      retired   Social History Main Topics  . Smoking status: Never Smoker  . Smokeless tobacco: Never Used  . Alcohol use No  . Drug use: No  . Sexual activity: No    Tobacco Counseling Counseling given:  No   Activities of Daily Living In your present state of health, do you have any difficulty performing the following activities: 05/28/2016 03/27/2016  Hearing? Y -  Vision? Y -  Difficulty concentrating or making decisions? N -  Walking or climbing stairs? Y -  Dressing or bathing? N -  Doing errands, shopping? Y N  Preparing Food and eating ? N -  Using the Toilet? N -  In the past six months, have you accidently leaked urine? Y -  Do you have problems with loss of bowel control? N -  Managing your Medications? N -  Managing your Finances? N -  Housekeeping or managing your Housekeeping? Y -  Some recent data might be hidden    Immunizations and Health Maintenance Immunization History  Administered Date(s) Administered  . Influenza,inj,Quad PF,36+ Mos 05/23/2015, 05/28/2016  . Influenza-Unspecified 07/07/2012, 02/28/2014  . Pneumococcal Conjugate-13 07/14/2014  . Tdap 01/26/2016   Health Maintenance Due  Topic Date Due  . HIV Screening  10/19/1965    Patient Care Team: Kirt Boys, DO as PCP - General (Internal Medicine)  Indicate any recent Medical Services you may have received from other than [facility] providers in the past year (date may be approximate).     Assessment:   This is a routine wellness examination for Melinda [name].  Hearing/Vision screen  Visual Acuity Screening   Right eye Left eye Both eyes  Without correction:     With correction: 20/30 20/25 20/30   Comments: Last eye exam done several years ago.   Hearing Screening Comments: Pt states last hearing screen done over 10 yrs ago.   Dietary issues and exercise activities discussed: Current Exercise Habits: The patient does not participate in regular exercise at present, Exercise limited by: None identified  Goals    . Weight (lb) < 200 lb (90.7 kg)          Starting 05/28/16, I will attempt to lose 50 lbs, over the next year and strengthen my leg muscles, so I can stand better.       Depression  Screen PHQ 2/9 Scores 05/28/2016 09/20/2015 05/23/2015 01/12/2013  PHQ - 2 Score 6 2 0 0  PHQ- 9 Score 17 4 - -    Fall Risk Fall Risk  05/28/2016 01/25/2016 10/26/2015 09/20/2015 05/23/2015  Falls in the past year? Yes Yes Yes Yes Yes  Number falls in past yr: 2 or more 2 or more 1 2 or more 2 or more  Injury with Fall? Yes No Yes Yes Yes  Risk Factor Category  High Fall Risk - - - -  Follow up Falls prevention discussed - - - -    Cognitive Function: MMSE - Mini Mental State Exam 05/28/2016  Orientation to time 5  Orientation to Place 5  Registration 3  Attention/ Calculation 5  Recall 0  Language- name 2 objects 2  Language- repeat 1  Language- follow 3 step command 3  Language- read & follow direction 1  Write a sentence 1  Copy design 1  Total score 27        Screening Tests Health Maintenance  Topic Date Due  . HIV Screening  10/19/1965  . MAMMOGRAM  06/29/2017 (Originally 10/18/2001)  . COLONOSCOPY  06/29/2017 (Originally 10/18/2001)  . ZOSTAVAX  06/29/2020 (Originally 10/19/2011)  . PAP SMEAR  08/15/2018  . TETANUS/TDAP  01/25/2026  . INFLUENZA VACCINE  Completed  . Hepatitis C Screening  Completed      Plan:  I have personally reviewed and addressed the Medicare Annual Wellness questionnaire and have noted the following in the patient's chart:  A. Medical and social history B. Use of alcohol, tobacco or illicit drugs  C. Current medications and supplements D. Functional ability and status E.  Nutritional status F.  Physical activity G. Advance directives H. List of other physicians I.  Hospitalizations, surgeries, and ER visits in previous 12 months J.  Vitals K. Screenings to include hearing, vision, cognitive, depression L. Referrals and appointments - none  In addition, I have reviewed and discussed with patient certain preventive protocols, quality metrics, and best practice recommendations. A written personalized care plan for preventive services as  well as general preventive health recommendations were provided to patient.  See attached scanned questionnaire for additional information.   Signed,   Nilda Calamity, LPN Health Advisor   [place]. Ancil Linsey  [facility] and [facility] [address] [place], [place] [address] [phone] Cell (Monday-Friday 8 AM - 5 PM) [phone] After 5 PM and follow prompts

## 2016-05-28 NOTE — Patient Instructions (Addendum)
Melinda [name] , Thank you for taking time to come for your Medicare Wellness Visit. I appreciate your ongoing commitment to your health goals. Please review the following plan we discussed and let me know if I can assist you in the future.   These are the goals we discussed: Goals    . Weight (lb) < 200 lb (90.7 kg)          Starting 05/28/16, I will attempt to lose 50 lbs, over the next year and strengthen my leg muscles, so I can stand better.        This is a list of the screening recommended for you and due dates:  Health Maintenance  Topic Date Due  . HIV Screening  10/19/1965  . Flu Shot  01/29/2016  . Mammogram  06/29/2017*  . Colon Cancer Screening  06/29/2017*  . Shingles Vaccine  06/29/2020*  . Pap Smear  08/15/2018  . Tetanus Vaccine  01/25/2026  .  Hepatitis C: One time screening is recommended by Center for Disease Control  (CDC) for  adults born from [birth year] through 1964.   Completed  *Topic was postponed. The date shown is not the original due date.  Preventive Care for Adults  A healthy lifestyle and preventive care can promote health and wellness. Preventive health guidelines for adults include the following key practices.  . A routine yearly physical is a good way to check with your health care provider about your health and preventive screening. It is a chance to share any concerns and updates on your health and to receive a thorough exam.  . Visit your dentist for a routine exam and preventive care every 6 months. Brush your teeth twice a day and floss once a day. Good oral hygiene prevents tooth decay and gum disease.  . The frequency of eye exams is based on your age, health, family medical history, use  of contact lenses, and other factors. Follow your health care provider's ecommendations for frequency of eye exams.  . Eat a healthy diet. Foods like vegetables, fruits, whole grains, low-fat dairy products, and lean protein foods contain the nutrients you need  without too many calories. Decrease your intake of foods high in solid fats, added sugars, and salt. Eat the right amount of calories for you. Get information about a proper diet from your health care provider, if necessary.  . Regular physical exercise is one of the most important things you can do for your health. Most adults should get at least 150 minutes of moderate-intensity exercise (any activity that increases your heart rate and causes you to sweat) each week. In addition, most adults need muscle-strengthening exercises on 2 or more days a week.  Silver Sneakers may be a benefit available to you. To determine eligibility, you may visit the website: www.silversneakers.com or contact program at 775-825-5481 Mon-Fri between 8AM-8PM.   . Maintain a healthy weight. The body mass index (BMI) is a screening tool to identify possible weight problems. It provides an estimate of body fat based on height and weight. Your health care provider can find your BMI and can help you achieve or maintain a healthy weight.   For adults 20 years and older: ? A BMI below 18.5 is considered underweight. ? A BMI of 18.5 to 24.9 is normal. ? A BMI of 25 to 29.9 is considered overweight. ? A BMI of 30 and above is considered obese.   . Maintain normal blood lipids and cholesterol levels by exercising and  minimizing your intake of saturated fat. Eat a balanced diet with plenty of fruit and vegetables. Blood tests for lipids and cholesterol should begin at age 15 and be repeated every 5 years. If your lipid or cholesterol levels are high, you are over 50, or you are at high risk for heart disease, you may need your cholesterol levels checked more frequently. Ongoing high lipid and cholesterol levels should be treated with medicines if diet and exercise are not working.  . If you smoke, find out from your health care provider how to quit. If you do not use tobacco, please do not start.  . If you choose to drink  alcohol, please do not consume more than 2 drinks per day. One drink is considered to be 12 ounces (355 mL) of beer, 5 ounces (148 mL) of wine, or 1.5 ounces (44 mL) of liquor.  . If you are 101-64 years old, ask your health care provider if you should take aspirin to prevent strokes.  . Use sunscreen. Apply sunscreen liberally and repeatedly throughout the day. You should seek shade when your shadow is shorter than you. Protect yourself by wearing long sleeves, pants, a wide-brimmed hat, and sunglasses year round, whenever you are outdoors.  . Once a month, do a whole body skin exam, using a mirror to look at the skin on your back. Tell your health care provider of new moles, moles that have irregular borders, moles that are larger than a pencil eraser, or moles that have changed in shape or color.
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
## 2016-05-29 ENCOUNTER — Telehealth: Payer: Self-pay

## 2016-05-29 DIAGNOSIS — G609 Hereditary and idiopathic neuropathy, unspecified: Secondary | ICD-10-CM | POA: Diagnosis not present

## 2016-05-29 DIAGNOSIS — G894 Chronic pain syndrome: Secondary | ICD-10-CM | POA: Diagnosis not present

## 2016-05-29 DIAGNOSIS — R296 Repeated falls: Secondary | ICD-10-CM | POA: Diagnosis not present

## 2016-05-29 DIAGNOSIS — M5430 Sciatica, unspecified side: Secondary | ICD-10-CM | POA: Diagnosis not present

## 2016-05-29 DIAGNOSIS — M1990 Unspecified osteoarthritis, unspecified site: Secondary | ICD-10-CM | POA: Diagnosis not present

## 2016-05-29 DIAGNOSIS — I10 Essential (primary) hypertension: Secondary | ICD-10-CM | POA: Diagnosis not present

## 2016-05-29 NOTE — Telephone Encounter (Signed)
Message left on clinical intake line requesting verbal order to extend physical therapy.  Per [employer] standing order, verbal order given. Message will be sent to patient's provider as a FYI.

## 2016-05-31 DIAGNOSIS — G609 Hereditary and idiopathic neuropathy, unspecified: Secondary | ICD-10-CM | POA: Diagnosis not present

## 2016-05-31 DIAGNOSIS — I10 Essential (primary) hypertension: Secondary | ICD-10-CM | POA: Diagnosis not present

## 2016-05-31 DIAGNOSIS — M1990 Unspecified osteoarthritis, unspecified site: Secondary | ICD-10-CM | POA: Diagnosis not present

## 2016-05-31 DIAGNOSIS — M5430 Sciatica, unspecified side: Secondary | ICD-10-CM | POA: Diagnosis not present

## 2016-05-31 DIAGNOSIS — Z9181 History of falling: Secondary | ICD-10-CM | POA: Diagnosis not present

## 2016-05-31 DIAGNOSIS — R269 Unspecified abnormalities of gait and mobility: Secondary | ICD-10-CM | POA: Diagnosis not present

## 2016-05-31 DIAGNOSIS — I5042 Chronic combined systolic (congestive) and diastolic (congestive) heart failure: Secondary | ICD-10-CM | POA: Diagnosis not present

## 2016-05-31 DIAGNOSIS — G894 Chronic pain syndrome: Secondary | ICD-10-CM | POA: Diagnosis not present

## 2016-06-03 DIAGNOSIS — I10 Essential (primary) hypertension: Secondary | ICD-10-CM | POA: Diagnosis not present

## 2016-06-03 DIAGNOSIS — Z9181 History of falling: Secondary | ICD-10-CM | POA: Diagnosis not present

## 2016-06-03 DIAGNOSIS — M1990 Unspecified osteoarthritis, unspecified site: Secondary | ICD-10-CM | POA: Diagnosis not present

## 2016-06-03 DIAGNOSIS — M5430 Sciatica, unspecified side: Secondary | ICD-10-CM | POA: Diagnosis not present

## 2016-06-03 DIAGNOSIS — G609 Hereditary and idiopathic neuropathy, unspecified: Secondary | ICD-10-CM | POA: Diagnosis not present

## 2016-06-03 DIAGNOSIS — G894 Chronic pain syndrome: Secondary | ICD-10-CM | POA: Diagnosis not present

## 2016-06-05 DIAGNOSIS — M5430 Sciatica, unspecified side: Secondary | ICD-10-CM | POA: Diagnosis not present

## 2016-06-05 DIAGNOSIS — M1990 Unspecified osteoarthritis, unspecified site: Secondary | ICD-10-CM | POA: Diagnosis not present

## 2016-06-05 DIAGNOSIS — G894 Chronic pain syndrome: Secondary | ICD-10-CM | POA: Diagnosis not present

## 2016-06-05 DIAGNOSIS — G609 Hereditary and idiopathic neuropathy, unspecified: Secondary | ICD-10-CM | POA: Diagnosis not present

## 2016-06-05 DIAGNOSIS — Z9181 History of falling: Secondary | ICD-10-CM | POA: Diagnosis not present

## 2016-06-05 DIAGNOSIS — I10 Essential (primary) hypertension: Secondary | ICD-10-CM | POA: Diagnosis not present

## 2016-06-11 DIAGNOSIS — I10 Essential (primary) hypertension: Secondary | ICD-10-CM | POA: Diagnosis not present

## 2016-06-11 DIAGNOSIS — Z9181 History of falling: Secondary | ICD-10-CM | POA: Diagnosis not present

## 2016-06-11 DIAGNOSIS — M5430 Sciatica, unspecified side: Secondary | ICD-10-CM | POA: Diagnosis not present

## 2016-06-11 DIAGNOSIS — G609 Hereditary and idiopathic neuropathy, unspecified: Secondary | ICD-10-CM | POA: Diagnosis not present

## 2016-06-11 DIAGNOSIS — M1990 Unspecified osteoarthritis, unspecified site: Secondary | ICD-10-CM | POA: Diagnosis not present

## 2016-06-11 DIAGNOSIS — G894 Chronic pain syndrome: Secondary | ICD-10-CM | POA: Diagnosis not present

## 2016-06-13 DIAGNOSIS — G894 Chronic pain syndrome: Secondary | ICD-10-CM | POA: Diagnosis not present

## 2016-06-13 DIAGNOSIS — G609 Hereditary and idiopathic neuropathy, unspecified: Secondary | ICD-10-CM | POA: Diagnosis not present

## 2016-06-13 DIAGNOSIS — I10 Essential (primary) hypertension: Secondary | ICD-10-CM | POA: Diagnosis not present

## 2016-06-13 DIAGNOSIS — M1990 Unspecified osteoarthritis, unspecified site: Secondary | ICD-10-CM | POA: Diagnosis not present

## 2016-06-13 DIAGNOSIS — M5430 Sciatica, unspecified side: Secondary | ICD-10-CM | POA: Diagnosis not present

## 2016-06-13 DIAGNOSIS — Z9181 History of falling: Secondary | ICD-10-CM | POA: Diagnosis not present

## 2016-06-16 DIAGNOSIS — G894 Chronic pain syndrome: Secondary | ICD-10-CM | POA: Diagnosis not present

## 2016-06-16 DIAGNOSIS — G609 Hereditary and idiopathic neuropathy, unspecified: Secondary | ICD-10-CM | POA: Diagnosis not present

## 2016-06-16 DIAGNOSIS — I10 Essential (primary) hypertension: Secondary | ICD-10-CM | POA: Diagnosis not present

## 2016-06-16 DIAGNOSIS — Z9181 History of falling: Secondary | ICD-10-CM | POA: Diagnosis not present

## 2016-06-16 DIAGNOSIS — M5430 Sciatica, unspecified side: Secondary | ICD-10-CM | POA: Diagnosis not present

## 2016-06-16 DIAGNOSIS — M1990 Unspecified osteoarthritis, unspecified site: Secondary | ICD-10-CM | POA: Diagnosis not present

## 2016-06-17 ENCOUNTER — Ambulatory Visit: Payer: Commercial Managed Care - HMO | Admitting: Nurse Practitioner

## 2016-06-24 ENCOUNTER — Other Ambulatory Visit: Payer: Self-pay | Admitting: *Deleted

## 2016-06-24 DIAGNOSIS — G894 Chronic pain syndrome: Secondary | ICD-10-CM

## 2016-06-24 DIAGNOSIS — M797 Fibromyalgia: Secondary | ICD-10-CM

## 2016-06-24 DIAGNOSIS — M4800 Spinal stenosis, site unspecified: Secondary | ICD-10-CM

## 2016-06-24 DIAGNOSIS — M5430 Sciatica, unspecified side: Secondary | ICD-10-CM

## 2016-06-24 DIAGNOSIS — G609 Hereditary and idiopathic neuropathy, unspecified: Secondary | ICD-10-CM

## 2016-06-24 MED ORDER — OXYCODONE HCL 15 MG PO TABS: 15.0000 mg | ORAL_TABLET | ORAL | 0 refills | Status: DC | PRN

## 2016-06-24 NOTE — Telephone Encounter (Signed)
Patient requested and will have son pick up

## 2016-06-27 ENCOUNTER — Ambulatory Visit: Payer: Commercial Managed Care - HMO | Admitting: Internal Medicine

## 2016-07-01 DIAGNOSIS — I5042 Chronic combined systolic (congestive) and diastolic (congestive) heart failure: Secondary | ICD-10-CM | POA: Diagnosis not present

## 2016-07-01 DIAGNOSIS — R269 Unspecified abnormalities of gait and mobility: Secondary | ICD-10-CM | POA: Diagnosis not present

## 2016-07-24 ENCOUNTER — Other Ambulatory Visit: Payer: Self-pay

## 2016-07-24 DIAGNOSIS — G894 Chronic pain syndrome: Secondary | ICD-10-CM

## 2016-07-24 DIAGNOSIS — M5430 Sciatica, unspecified side: Secondary | ICD-10-CM

## 2016-07-24 DIAGNOSIS — M4800 Spinal stenosis, site unspecified: Secondary | ICD-10-CM

## 2016-07-24 DIAGNOSIS — G609 Hereditary and idiopathic neuropathy, unspecified: Secondary | ICD-10-CM

## 2016-07-24 DIAGNOSIS — M797 Fibromyalgia: Secondary | ICD-10-CM

## 2016-07-24 MED ORDER — OXYCODONE HCL 15 MG PO TABS: 15.0000 mg | ORAL_TABLET | ORAL | 0 refills | Status: DC | PRN

## 2016-08-01 DIAGNOSIS — I5042 Chronic combined systolic (congestive) and diastolic (congestive) heart failure: Secondary | ICD-10-CM | POA: Diagnosis not present

## 2016-08-01 DIAGNOSIS — R269 Unspecified abnormalities of gait and mobility: Secondary | ICD-10-CM | POA: Diagnosis not present

## 2016-08-13 ENCOUNTER — Telehealth: Payer: Self-pay

## 2016-08-13 DIAGNOSIS — Z1231 Encounter for screening mammogram for malignant neoplasm of breast: Secondary | ICD-10-CM

## 2016-08-13 NOTE — Telephone Encounter (Signed)
Spoke with Melinda [name] about scheduling a mammogram. She would like someone to schedule appointment and give her a call back with date and time.

## 2016-08-14 ENCOUNTER — Ambulatory Visit (HOSPITAL_COMMUNITY)
Admission: EM | Admit: 2016-08-14 | Discharge: 2016-08-14 | Disposition: A | Discharge status: Home / Self Care | Payer: Medicare HMO | Attending: Internal Medicine | Admitting: Internal Medicine

## 2016-08-14 ENCOUNTER — Encounter (HOSPITAL_COMMUNITY): Payer: Self-pay | Admitting: Emergency Medicine

## 2016-08-14 ENCOUNTER — Ambulatory Visit (INDEPENDENT_AMBULATORY_CARE_PROVIDER_SITE_OTHER): Payer: Medicare HMO

## 2016-08-14 DIAGNOSIS — S79912A Unspecified injury of left hip, initial encounter: Secondary | ICD-10-CM

## 2016-08-14 DIAGNOSIS — S4992XA Unspecified injury of left shoulder and upper arm, initial encounter: Secondary | ICD-10-CM | POA: Diagnosis not present

## 2016-08-14 DIAGNOSIS — M25552 Pain in left hip: Secondary | ICD-10-CM

## 2016-08-14 DIAGNOSIS — S50312A Abrasion of left elbow, initial encounter: Secondary | ICD-10-CM

## 2016-08-14 DIAGNOSIS — S40012A Contusion of left shoulder, initial encounter: Secondary | ICD-10-CM

## 2016-08-14 MED ORDER — CYCLOBENZAPRINE HCL 10 MG PO TABS: 10.0000 mg | ORAL_TABLET | Freq: Three times a day (TID) | ORAL | 0 refills | Status: DC | PRN

## 2016-08-14 MED ORDER — KETOROLAC TROMETHAMINE 60 MG/2ML IM SOLN
60.0000 mg | Freq: Once | INTRAMUSCULAR | Status: AC
  Administered 2016-08-14: 60 mg via INTRAMUSCULAR

## 2016-08-14 NOTE — Discharge Instructions (Signed)
You were given a shot of Toradol today to help with muscle pain. You may take Flexeril 10mg  every 8 hours as needed for muscle spasms. Continue Celebrex twice a day. May use your Oxycodone as needed for pain. Follow-up with your primary care provider in 3 to 4 days if not improving or go to ER if pain or symptoms worsen.

## 2016-08-14 NOTE — ED Triage Notes (Signed)
Pt reports she fell yest around 1600  States she tripped in a parking lot yest and fell backwards  Having pain on left side of arm and LLE and abd pain  Denies head inj/LOC  Hx of fibromyalgia, arthritis and

## 2016-08-14 NOTE — ED Provider Notes (Signed)
CSN: [id number]     Arrival date & time 08/14/16  1131 History   First MD Initiated Contact with Patient 08/14/16 1250     Chief Complaint  Patient presents with  . Fall   (Consider location/radiation/quality/duration/timing/severity/associated sxs/prior Treatment) 66 year old female presents today after falling in a parking lot yesterday afternoon. She landed on her left shoulder, elbow and hip. She needed help from 2 other women to get her up. Now having more left shoulder pain and unable to straighten arm today. Has some abrasions present on left elbow which are healing. Also unable to bend/flex hip without pain. Does have history of arthritis in the left hip as well as fibromyalgia and sciatica. Takes Gabapentin and Celebrex daily and has oxycodone as needed for pain. Did not take any medication today for pain.    The history is provided by the patient.    Past Medical History:  Diagnosis Date  . Asthma   . Bursitis   . Depressive disorder, not elsewhere classified   . Enthesopathy of hip region   . Essential hypertension, benign   . Muscle weakness (generalized)   . Myalgia and myositis, unspecified   . Other abnormal blood chemistry   . Other malaise and fatigue   . Personal history of arthritis   . Personal history of fall   . Raynaud's syndrome   . Sciatica   . Unspecified vitamin D deficiency    Past Surgical History:  Procedure Laterality Date  . ABDOMINAL HYSTERECTOMY  1985   Family History  Problem Relation Age of Onset  . Dementia Mother   . Heart disease Mother   . Hypertension Mother   . Diabetes Mother   . Diabetes Sister   . Cancer Sister     lung  . Cancer Other     Nepher (Mothers side)  . Diabetes Maternal Grandmother   . Arthritis Maternal Grandmother   . Diabetes Cousin     Mother's side  . Arthritis Sister   . Arthritis Cousin     Mother's side    Social History  Substance Use Topics  . Smoking status: Never Smoker  . Smokeless tobacco:  Never Used  . Alcohol use No   OB History    No data available     Review of Systems  Constitutional: Positive for activity change.  Eyes: Negative for visual disturbance.  Respiratory: Negative for chest tightness and shortness of breath.   Cardiovascular: Negative for chest pain.  Gastrointestinal: Negative for diarrhea, nausea and vomiting.  Genitourinary: Negative for difficulty urinating.  Musculoskeletal: Positive for arthralgias, back pain, gait problem, myalgias and neck pain. Negative for joint swelling.  Skin: Positive for wound.  Neurological: Positive for weakness. Negative for dizziness, syncope, light-headedness and headaches.  Hematological: Negative for adenopathy.    Allergies  Patient has no known allergies.  Home Medications   Prior to Admission medications   Medication Sig Start Date End Date Taking? Authorizing Provider  albuterol (PROVENTIL HFA;VENTOLIN HFA) 108 (90 Base) MCG/ACT inhaler Inhale 2 puffs into the lungs every 6 (six) hours as needed for wheezing or shortness of breath. 12/20/15  Yes Kirt Boys, DO  amitriptyline (ELAVIL) 25 MG tablet TAKE TWO TABLETS BY MOUTH AT BEDTIME FOR REST 01/23/16  Yes Kirt Boys, DO  aspirin 81 MG tablet Take 81 mg by mouth daily.   Yes Historical Provider, MD  celecoxib (CELEBREX) 100 MG capsule TAKE ONE CAPSULE TWICE DAILY FOR PAINS 03/27/16  Yes Tiffany L  Reed, DO  citalopram (CELEXA) 20 MG tablet Take 1 tablet (20 mg total) by mouth daily. 05/28/16  Yes Monica Carter, DO  doxepin (SINEQUAN) 50 MG capsule TAKE 2 CAPSULES (100 MG TOTAL) BY MOUTH AT BEDTIME. 01/23/16  Yes Kirt Boys, DO  EQ LORATADINE 10 MG tablet TAKE ONE TABLET BY MOUTH ONCE DAILY 04/18/16  Yes Kirt Boys, DO  gabapentin (NEURONTIN) 400 MG capsule TAKE THREE CAPSULES BY MOUTH TWICE DAILY FOR PAINS 02/05/16  Yes Kirt Boys, DO  metoprolol (LOPRESSOR) 50 MG tablet TAKE ONE TABLET BY MOUTH TWICE DAILY FOR BLOOD PRESSURE 01/23/16  Yes Kirt Boys, DO  oxyCODONE (ROXICODONE) 15 MG immediate release tablet Take 1 tablet (15 mg total) by mouth every 4 (four) hours as needed for pain. 07/24/16  Yes Sharon Seller, NP  vitamin D, CHOLECALCIFEROL, 400 UNITS tablet Take by mouth daily. Take three  Capsules by mouth to equal 1200mg  daily.   Yes Historical Provider, MD  cyclobenzaprine (FLEXERIL) 10 MG tablet Take 1 tablet (10 mg total) by mouth 3 (three) times daily as needed for muscle spasms. 08/14/16   08/16/16, NP  fluticasone (FLONASE) 50 MCG/ACT nasal spray Place 2 sprays into both nostrils daily as needed for allergies or rhinitis. 11/14/14   11/16/14, DO   Meds Ordered and Administered this Visit   Medications  ketorolac (TORADOL) injection 60 mg (60 mg Intramuscular Given 08/14/16 1425)    BP 120/83 (BP Location: Left Arm)   Pulse 70   Temp 98.1 F (36.7 C) (Oral)   Resp 16   SpO2 98%  No data found.   Physical Exam  Constitutional: She is oriented to person, place, and time. She appears well-developed and well-nourished. No distress.  HENT:  Head: Normocephalic and atraumatic.  Right Ear: External ear normal.  Left Ear: External ear normal.  Nose: Nose normal.  Mouth/Throat: Oropharynx is clear and moist.  Eyes: EOM are normal. Pupils are equal, round, and reactive to light.  Neck: Neck supple. No neck rigidity. Decreased range of motion present. No edema and no erythema present.  Cardiovascular: Normal rate, regular rhythm and normal heart sounds.   Pulmonary/Chest: Effort normal and breath sounds normal. No respiratory distress. She has no wheezes.  Musculoskeletal: She exhibits tenderness.       Left shoulder: She exhibits decreased range of motion, tenderness, pain, spasm and decreased strength. She exhibits no swelling, no deformity, no laceration and normal pulse.       Left hip: She exhibits decreased range of motion, decreased strength and tenderness. She exhibits no swelling, no deformity and no  laceration.  Left shoulder, elbow and hip with decreased range of motion, particularly with flexion of hip and extension and abduction of shoulder. Pain with any movement that travels down left arm to wrist. Muscle spasms present in left trapezius area of neck/shoulder. Unable to stand- uses walker. Decreased strength but good tone. Good distal pulses. No neuro deficits noted.    Lymphadenopathy:    She has no cervical adenopathy.  Neurological: She is alert and oriented to person, place, and time. She has normal strength. No sensory deficit.  Skin: Skin is warm and dry. Capillary refill takes less than 2 seconds. Abrasion noted. There is erythema.     2 small abrasions present on left elbow. Healing.  Minimal redness and swelling. No discharge. Tender but not warm.   Psychiatric: She has a normal mood and affect. Her behavior is normal. Judgment and thought content  normal.    Urgent Care Course     Procedures (including critical care time)  Labs Review Labs Reviewed - No data to display  Imaging Review Dg Shoulder Left  Result Date: 08/14/2016 CLINICAL DATA:  Status post fall onto the left shoulder yesterday. No previous injury. EXAM: LEFT SHOULDER - 2+ VIEW COMPARISON:  Chest x-ray dated August 18, 2015 which included portions of the left shoulder. FINDINGS: The bones are subjectively adequately mineralized. The glenohumeral joint space appears preserved. The articular surfaces of the humeral head and acetabulum remains smoothly rounded. The subacromial subdeltoid space is normal. There is mild degenerative spurring and joint space narrowing of the AC joint. The clavicle and upper left ribs are intact. No scapular fracture is observed. IMPRESSION: There are degenerative changes centered on the [facility] joint. No acute fracture nor dislocation is observed. Electronically Signed   By: David  [place] M.D.   On: 08/14/2016 13:44   Dg Hip Unilat With Pelvis 2-3 Views Left  Result Date:  08/14/2016 CLINICAL DATA:  Fall yesterday with left-sided hip pain, initial encounter EXAM: DG HIP (WITH OR WITHOUT PELVIS) 2-3V LEFT COMPARISON:  03/27/2016 FINDINGS: Mild degenerative changes of left hip joint are noted and stable. No acute fracture or dislocation is seen. No soft tissue abnormality is noted. IMPRESSION: No acute abnormality seen. Electronically Signed   By: Alcide Clever M.D.   On: 08/14/2016 13:49     Visual Acuity Review  Right Eye Distance:   Left Eye Distance:   Bilateral Distance:    Right Eye Near:   Left Eye Near:    Bilateral Near:         MDM   1. Contusion of left shoulder, initial encounter   2. Trauma left hip, initial encounter   3. Acute pain of left hip   4. Abrasion of elbow, left, initial encounter    Reviewed x-ray results with patient- shows mostly arthritic changes- no fractures. Recommend Toradol 60mg  IM now (recent normal kidney function tests in November 2017). May take Flexeril 10mg  every 8 hours as needed for muscle spasms. Continue Celebrex as directed. May use her Oxycodone if needed for pain. Provided arm sling to provide support and comfort- discussed removing sling occasionally during the day and at night to keep joint active. Recommend follow-up with her primary care provider in 3 to 4 days if not improving or go to ER if pain or symptoms worsen.     December 2017, NP 08/14/16 [phone]

## 2016-08-25 ENCOUNTER — Other Ambulatory Visit: Payer: Self-pay | Admitting: *Deleted

## 2016-08-25 DIAGNOSIS — M797 Fibromyalgia: Secondary | ICD-10-CM

## 2016-08-25 DIAGNOSIS — G894 Chronic pain syndrome: Secondary | ICD-10-CM

## 2016-08-25 DIAGNOSIS — M4800 Spinal stenosis, site unspecified: Secondary | ICD-10-CM

## 2016-08-25 DIAGNOSIS — G609 Hereditary and idiopathic neuropathy, unspecified: Secondary | ICD-10-CM

## 2016-08-25 DIAGNOSIS — M5430 Sciatica, unspecified side: Secondary | ICD-10-CM

## 2016-08-25 MED ORDER — AMITRIPTYLINE HCL 25 MG PO TABS: 50.0000 mg | ORAL_TABLET | Freq: Every evening | ORAL | 0 refills | Status: DC | PRN

## 2016-08-25 MED ORDER — OXYCODONE HCL 15 MG PO TABS: 15.0000 mg | ORAL_TABLET | ORAL | 0 refills | Status: DC | PRN

## 2016-08-25 NOTE — Telephone Encounter (Signed)
Patient requested Rx and will pick up for her Oxycodone. Printed and placed for signature. Patient also wants to know if we can call in Amitriptyline #120 to her pharmacy so it will be cheaper. States its cheaper at [facility] getting 2 month supply at a time. Please Advise.

## 2016-08-26 ENCOUNTER — Telehealth: Payer: Self-pay | Admitting: Internal Medicine

## 2016-08-26 MED ORDER — AMITRIPTYLINE HCL 25 MG PO TABS: 50.0000 mg | ORAL_TABLET | Freq: Every evening | ORAL | 0 refills | Status: DC | PRN

## 2016-08-26 NOTE — Telephone Encounter (Signed)
[employer] forms were found in the front door this morning when we opened the door with a request for completion. Forms were mailed back to the patient along with the Patient Intake Information for Forms Completion paper work and patient was asked to return to the office during regular business hours.

## 2016-08-28 NOTE — Telephone Encounter (Addendum)
Medication faxed to pharmacy on 2/27 by Buena Irish, [place] Preferred Name:  None Female, 66 y.o., [birth year] Last Weight:  240 lb 11.9 oz (109.2 kg) Weight:  240 lb 11.9 oz (109.2 kg) Phone:  *H:[phone] PCP:  Kirt Boys, DO Language:  English Need Interpreter:  None Allergies:  No Known Allergies Health Maintenance Due?:  Health Maintenance Active FYIs:  General Primary Ins.:  [employer] MED[employer]ARE MRN:  [id number] MyChart:  Pending Next Appt:  None   Message  Received: 2 days ago  Message Contents  Kirt Boys, DO  Beckey Downing Aasia Peavler, CMA  Caller: Unspecified (3 days ago, 9:08 AM)        Ok to send amitriptyline as requested   Previous Messages          Approved Medications  oxyCODONE (ROXICODONE) 15 MG immediate release tablet Take 1 tablet (15 mg total) by mouth every 4 (four) hours as needed for pain.     Disp: 180 tablet Refills: 0   Class: Print Start: 08/25/2016  For: Chronic pain syndrome; Fibromyalgia; Idiopathic peripheral neuropathy; Sciatica, unspecified laterality; Spinal stenosis, unspecified spinal region Approved by: Beckey Downing Odilia Damico, CMA amitriptyline (ELAVIL) 25 MG tablet Take 2 tablets (50 mg total) by mouth at bedtime as needed for sleep.     Disp: 120 tablet Refills: 0   Class: Normal Start: 08/26/2016  Approved by: Luana Shu, RMA To be filled at: [facility] [place], [place] [address] [phone] Rx Response   Kirt Boys, DO  You 2 days ago    Ok to send amitriptyline as requested (Routing comment)     You routed conversation to Cisco, DO; Meshell A Simpson, RMA 3 days ago    You 3 days ago      Patient requested Rx and will pick up for her Oxycodone. Printed and placed for signature. Patient also wants to know if we can call in Amitriptyline #120 to her pharmacy so it will be cheaper. States its cheaper at [facility] getting 2 month supply at a time. Please Advise.         Documentation

## 2016-08-29 DIAGNOSIS — I5042 Chronic combined systolic (congestive) and diastolic (congestive) heart failure: Secondary | ICD-10-CM | POA: Diagnosis not present

## 2016-08-29 DIAGNOSIS — R269 Unspecified abnormalities of gait and mobility: Secondary | ICD-10-CM | POA: Diagnosis not present

## 2016-09-01 ENCOUNTER — Telehealth: Payer: Self-pay | Admitting: Internal Medicine

## 2016-09-02 NOTE — Telephone Encounter (Signed)
Received paperwork from patient for [employer] for SCAT services. Printed last OV note and Face sheet and placed in folder for Dr. Montez Morita to review,fill out and sign. To call patient once completed #[phone]

## 2016-09-08 NOTE — Telephone Encounter (Signed)
Form completed by provider. Charge form left blank. Left message on voicemail for patient to return call when available    Reason for call- inform patient forms are available for pick up or can be mailed. Forms placed in clinical intake bin.   Copies made and sent for scanning.

## 2016-09-08 NOTE — Telephone Encounter (Signed)
SCAT Forms completed. LMOM to patient that forms were completed and ready for pick up. Forms up front drawer with patient's name on envelope. Chrae sent copy for scanning.

## 2016-09-11 ENCOUNTER — Other Ambulatory Visit: Payer: Self-pay | Admitting: Internal Medicine

## 2016-09-11 DIAGNOSIS — Z1231 Encounter for screening mammogram for malignant neoplasm of breast: Secondary | ICD-10-CM

## 2016-09-22 ENCOUNTER — Other Ambulatory Visit: Payer: Self-pay | Admitting: *Deleted

## 2016-09-22 DIAGNOSIS — M5430 Sciatica, unspecified side: Secondary | ICD-10-CM

## 2016-09-22 DIAGNOSIS — M4800 Spinal stenosis, site unspecified: Secondary | ICD-10-CM

## 2016-09-22 DIAGNOSIS — G609 Hereditary and idiopathic neuropathy, unspecified: Secondary | ICD-10-CM

## 2016-09-22 DIAGNOSIS — G894 Chronic pain syndrome: Secondary | ICD-10-CM

## 2016-09-22 DIAGNOSIS — M797 Fibromyalgia: Secondary | ICD-10-CM

## 2016-09-22 MED ORDER — OXYCODONE HCL 15 MG PO TABS: 15.0000 mg | ORAL_TABLET | ORAL | 0 refills | Status: DC | PRN

## 2016-09-22 NOTE — Telephone Encounter (Signed)
Patient informed rx signed and ready for pickup  

## 2016-09-22 NOTE — Telephone Encounter (Signed)
Patient requested 

## 2016-09-29 DIAGNOSIS — R269 Unspecified abnormalities of gait and mobility: Secondary | ICD-10-CM | POA: Diagnosis not present

## 2016-09-29 DIAGNOSIS — I5042 Chronic combined systolic (congestive) and diastolic (congestive) heart failure: Secondary | ICD-10-CM | POA: Diagnosis not present

## 2016-10-01 ENCOUNTER — Ambulatory Visit: Payer: Commercial Managed Care - HMO

## 2016-10-22 ENCOUNTER — Other Ambulatory Visit: Payer: Self-pay | Admitting: *Deleted

## 2016-10-22 DIAGNOSIS — G609 Hereditary and idiopathic neuropathy, unspecified: Secondary | ICD-10-CM

## 2016-10-22 DIAGNOSIS — M797 Fibromyalgia: Secondary | ICD-10-CM

## 2016-10-22 DIAGNOSIS — M5430 Sciatica, unspecified side: Secondary | ICD-10-CM

## 2016-10-22 DIAGNOSIS — M4800 Spinal stenosis, site unspecified: Secondary | ICD-10-CM

## 2016-10-22 DIAGNOSIS — G894 Chronic pain syndrome: Secondary | ICD-10-CM

## 2016-10-22 MED ORDER — OXYCODONE HCL 15 MG PO TABS: 15.0000 mg | ORAL_TABLET | ORAL | 0 refills | Status: DC | PRN

## 2016-10-22 NOTE — Telephone Encounter (Signed)
Patient requested and will have grandson pick up

## 2016-10-29 DIAGNOSIS — R269 Unspecified abnormalities of gait and mobility: Secondary | ICD-10-CM | POA: Diagnosis not present

## 2016-10-29 DIAGNOSIS — I5042 Chronic combined systolic (congestive) and diastolic (congestive) heart failure: Secondary | ICD-10-CM | POA: Diagnosis not present

## 2016-11-11 ENCOUNTER — Emergency Department (HOSPITAL_COMMUNITY): Payer: Medicare HMO

## 2016-11-11 ENCOUNTER — Observation Stay (HOSPITAL_COMMUNITY)
Admission: EM | Admit: 2016-11-11 | Discharge: 2016-11-12 | Disposition: A | Discharge status: Home / Self Care | Payer: Medicare HMO | Attending: Internal Medicine | Admitting: Internal Medicine

## 2016-11-11 ENCOUNTER — Encounter (HOSPITAL_COMMUNITY): Payer: Self-pay

## 2016-11-11 DIAGNOSIS — J45909 Unspecified asthma, uncomplicated: Secondary | ICD-10-CM

## 2016-11-11 DIAGNOSIS — F339 Major depressive disorder, recurrent, unspecified: Secondary | ICD-10-CM | POA: Diagnosis present

## 2016-11-11 DIAGNOSIS — R0789 Other chest pain: Secondary | ICD-10-CM

## 2016-11-11 DIAGNOSIS — R079 Chest pain, unspecified: Secondary | ICD-10-CM | POA: Diagnosis not present

## 2016-11-11 DIAGNOSIS — R072 Precordial pain: Principal | ICD-10-CM | POA: Insufficient documentation

## 2016-11-11 DIAGNOSIS — G629 Polyneuropathy, unspecified: Secondary | ICD-10-CM

## 2016-11-11 DIAGNOSIS — M797 Fibromyalgia: Secondary | ICD-10-CM | POA: Diagnosis not present

## 2016-11-11 DIAGNOSIS — M940 Chondrocostal junction syndrome [Tietze]: Secondary | ICD-10-CM | POA: Diagnosis not present

## 2016-11-11 DIAGNOSIS — I509 Heart failure, unspecified: Secondary | ICD-10-CM | POA: Diagnosis not present

## 2016-11-11 DIAGNOSIS — G63 Polyneuropathy in diseases classified elsewhere: Secondary | ICD-10-CM

## 2016-11-11 DIAGNOSIS — R062 Wheezing: Secondary | ICD-10-CM | POA: Insufficient documentation

## 2016-11-11 DIAGNOSIS — M4800 Spinal stenosis, site unspecified: Secondary | ICD-10-CM | POA: Diagnosis present

## 2016-11-11 DIAGNOSIS — I10 Essential (primary) hypertension: Secondary | ICD-10-CM | POA: Insufficient documentation

## 2016-11-11 HISTORY — DX: Scoliosis, unspecified: M41.9

## 2016-11-11 HISTORY — DX: Dorsalgia, unspecified: M54.9

## 2016-11-11 HISTORY — DX: Headache: R51

## 2016-11-11 HISTORY — DX: Other bursitis of hip, left hip: M70.72

## 2016-11-11 HISTORY — DX: Spinal stenosis, lumbar region without neurogenic claudication: M48.061

## 2016-11-11 HISTORY — DX: Fibromyalgia: M79.7

## 2016-11-11 HISTORY — DX: Headache, unspecified: R51.9

## 2016-11-11 HISTORY — DX: Other bursitis of hip, right hip: M70.71

## 2016-11-11 HISTORY — DX: Repeated falls: R29.6

## 2016-11-11 HISTORY — DX: Sciatica, left side: M54.32

## 2016-11-11 HISTORY — DX: Migraine, unspecified, not intractable, without status migrainosus: G43.909

## 2016-11-11 HISTORY — DX: Other chronic pain: G89.29

## 2016-11-11 HISTORY — DX: Personal history of other medical treatment: Z92.89

## 2016-11-11 HISTORY — DX: Other allergy status, other than to drugs and biological substances: Z91.09

## 2016-11-11 HISTORY — DX: Unspecified osteoarthritis, unspecified site: M19.90

## 2016-11-11 LAB — BASIC METABOLIC PANEL
Anion gap: 8 (ref 5–15)
BUN: 11 mg/dL (ref 6–20)
CO2: 25 mmol/L (ref 22–32)
Calcium: 8.9 mg/dL (ref 8.9–10.3)
Chloride: 104 mmol/L (ref 101–111)
Creatinine, Ser: 0.91 mg/dL (ref 0.44–1.00)
GFR calc Af Amer: >60 mL/min (ref >60)
GFR calc non Af Amer: >60 mL/min (ref >60)
Glucose, Bld: 93 mg/dL (ref 65–99)
Potassium: 4.3 mmol/L (ref 3.5–5.1)
Sodium: 137 mmol/L (ref 135–145)

## 2016-11-11 LAB — CREATININE, SERUM
Creatinine, Ser: 0.85 mg/dL (ref 0.44–1.00)
GFR calc Af Amer: >60 mL/min (ref >60)
GFR calc non Af Amer: >60 mL/min (ref >60)

## 2016-11-11 LAB — CBC WITH DIFFERENTIAL/PLATELET
Basophils Absolute: 0 10*3/uL (ref 0.0–0.1)
Basophils Relative: 1 %
Eosinophils Absolute: 0.3 10*3/uL (ref 0.0–0.7)
Eosinophils Relative: 5 %
HCT: 37.4 % (ref 36.0–46.0)
Hemoglobin: 11.7 g/dL — ABNORMAL LOW (ref 12.0–15.0)
Lymphocytes Relative: 31 %
Lymphs Abs: 1.9 10*3/uL (ref 0.7–4.0)
MCH: 27 pg (ref 26.0–34.0)
MCHC: 31.3 g/dL (ref 30.0–36.0)
MCV: 86.2 fL (ref 78.0–100.0)
Monocytes Absolute: 0.9 10*3/uL (ref 0.1–1.0)
Monocytes Relative: 15 %
Neutro Abs: 3 10*3/uL (ref 1.7–7.7)
Neutrophils Relative %: 48 %
Platelets: 367 10*3/uL (ref 150–400)
RBC: 4.34 MIL/uL (ref 3.87–5.11)
RDW: 13.2 % (ref 11.5–15.5)
WBC: 6.1 10*3/uL (ref 4.0–10.5)

## 2016-11-11 LAB — CBC
HCT: 37.4 % (ref 36.0–46.0)
Hemoglobin: 11.8 g/dL — ABNORMAL LOW (ref 12.0–15.0)
MCH: 27.2 pg (ref 26.0–34.0)
MCHC: 31.6 g/dL (ref 30.0–36.0)
MCV: 86.2 fL (ref 78.0–100.0)
Platelets: 354 10*3/uL (ref 150–400)
RBC: 4.34 MIL/uL (ref 3.87–5.11)
RDW: 13.3 % (ref 11.5–15.5)
WBC: 5.8 10*3/uL (ref 4.0–10.5)

## 2016-11-11 LAB — I-STAT TROPONIN, ED: Troponin i, poc: 0 ng/mL (ref 0.00–0.08)

## 2016-11-11 LAB — D-DIMER, QUANTITATIVE: D-Dimer, Quant: 0.29 ug/mL-FEU (ref 0.00–0.50)

## 2016-11-11 LAB — TROPONIN I
Troponin I: <0.03 ng/mL (ref <0.03)
Troponin I: <0.03 ng/mL (ref <0.03)
Troponin I: <0.03 ng/mL (ref <0.03)

## 2016-11-11 LAB — BRAIN NATRIURETIC PEPTIDE: B Natriuretic Peptide: 185.7 pg/mL — ABNORMAL HIGH (ref 0.0–100.0)

## 2016-11-11 MED ORDER — NITROGLYCERIN 2 % TD OINT
1.0000 [in_us] | TOPICAL_OINTMENT | Freq: Once | TRANSDERMAL | Status: AC
  Administered 2016-11-11: 1 [in_us] via TOPICAL
  Filled 2016-11-11: qty 1

## 2016-11-11 MED ORDER — OXYCODONE HCL 5 MG PO TABS
15.0000 mg | ORAL_TABLET | Freq: Four times a day (QID) | ORAL | Status: DC | PRN
Start: 2016-11-11 — End: 2016-11-11

## 2016-11-11 MED ORDER — ASPIRIN EC 81 MG PO TBEC
81.0000 mg | DELAYED_RELEASE_TABLET | Freq: Every day | ORAL | Status: DC
  Administered 2016-11-11 – 2016-11-12 (×2): 81 mg via ORAL
  Filled 2016-11-11 (×2): qty 1

## 2016-11-11 MED ORDER — OXYCODONE HCL 5 MG PO TABS
15.0000 mg | ORAL_TABLET | ORAL | Status: DC | PRN
  Administered 2016-11-11 – 2016-11-12 (×2): 15 mg via ORAL
  Filled 2016-11-11 (×2): qty 3

## 2016-11-11 MED ORDER — IPRATROPIUM-ALBUTEROL 0.5-2.5 (3) MG/3ML IN SOLN
3.0000 mL | Freq: Three times a day (TID) | RESPIRATORY_TRACT | Status: DC
  Administered 2016-11-12 (×2): 3 mL via RESPIRATORY_TRACT
  Filled 2016-11-11 (×2): qty 3

## 2016-11-11 MED ORDER — CYCLOBENZAPRINE HCL 10 MG PO TABS: 10.0000 mg | ORAL_TABLET | Freq: Three times a day (TID) | ORAL | Status: DC | PRN

## 2016-11-11 MED ORDER — AMITRIPTYLINE HCL 25 MG PO TABS: 50.0000 mg | ORAL_TABLET | Freq: Every evening | ORAL | Status: DC | PRN

## 2016-11-11 MED ORDER — METOPROLOL TARTRATE 50 MG PO TABS
50.0000 mg | ORAL_TABLET | Freq: Two times a day (BID) | ORAL | Status: DC
  Administered 2016-11-11 – 2016-11-12 (×3): 50 mg via ORAL
  Filled 2016-11-11 (×4): qty 1

## 2016-11-11 MED ORDER — ALBUTEROL SULFATE (2.5 MG/3ML) 0.083% IN NEBU
2.5000 mg | INHALATION_SOLUTION | RESPIRATORY_TRACT | Status: DC | PRN
  Administered 2016-11-12: 2.5 mg via RESPIRATORY_TRACT
  Filled 2016-11-11: qty 3

## 2016-11-11 MED ORDER — MORPHINE SULFATE (PF) 4 MG/ML IV SOLN: 2.0000 mg | INTRAVENOUS | Status: DC | PRN

## 2016-11-11 MED ORDER — ONDANSETRON HCL 4 MG/2ML IJ SOLN: 4.0000 mg | Freq: Four times a day (QID) | INTRAMUSCULAR | Status: DC | PRN

## 2016-11-11 MED ORDER — GABAPENTIN 400 MG PO CAPS
400.0000 mg | ORAL_CAPSULE | Freq: Two times a day (BID) | ORAL | Status: DC
  Administered 2016-11-11 – 2016-11-12 (×2): 400 mg via ORAL
  Filled 2016-11-11 (×2): qty 1

## 2016-11-11 MED ORDER — IPRATROPIUM-ALBUTEROL 0.5-2.5 (3) MG/3ML IN SOLN
3.0000 mL | RESPIRATORY_TRACT | Status: DC
  Administered 2016-11-11 (×2): 3 mL via RESPIRATORY_TRACT
  Filled 2016-11-11: qty 3

## 2016-11-11 MED ORDER — IPRATROPIUM-ALBUTEROL 0.5-2.5 (3) MG/3ML IN SOLN
RESPIRATORY_TRACT | Status: AC
  Filled 2016-11-11: qty 3

## 2016-11-11 MED ORDER — CITALOPRAM HYDROBROMIDE 20 MG PO TABS
20.0000 mg | ORAL_TABLET | Freq: Every day | ORAL | Status: DC
  Administered 2016-11-11 – 2016-11-12 (×2): 20 mg via ORAL
  Filled 2016-11-11 (×2): qty 1

## 2016-11-11 MED ORDER — GI COCKTAIL ~~LOC~~: 30.0000 mL | Freq: Four times a day (QID) | ORAL | Status: DC | PRN

## 2016-11-11 MED ORDER — ACETAMINOPHEN 325 MG PO TABS: 650.0000 mg | ORAL_TABLET | ORAL | Status: DC | PRN

## 2016-11-11 MED ORDER — ENOXAPARIN SODIUM 40 MG/0.4ML ~~LOC~~ SOLN
40.0000 mg | SUBCUTANEOUS | Status: DC
  Administered 2016-11-11: 40 mg via SUBCUTANEOUS
  Filled 2016-11-11: qty 0.4

## 2016-11-11 NOTE — ED Provider Notes (Signed)
Emergency Department Provider Note   I have reviewed the triage vital signs and the nursing notes.   HISTORY  Chief Complaint Chest pain  HPI Melinda [name] is a 66 y.o. female with PMH of myalgia, HTN, and asthma presents to the emergency department for evaluation of intermittent chest pain over the last 2 days has become constant and more severe today. Over the last 2 days she's had some primarily right sided soreness radiates into the right arm. Today she began having left chest and arm symptoms that she describes as a "catching" sensation. Denies any pleuritic pain. Pain is slightly worse with movement. The discomfort has become constant and radiating into the bilateral arms and neck. No history of AMI. No recent injury. Patient denies fever, chills, productive cough. Patient waited for 2 hours at home and when pain did not decrease she called EMS. She was given a full dose aspirin EMS and also nitroglycerin. After taking the nitroglycerin her pain decreased but is not completely gone at this time.   Past Medical History:  Diagnosis Date  . Asthma   . Bursitis   . Depressive disorder, not elsewhere classified   . Enthesopathy of hip region   . Essential hypertension, benign   . Muscle weakness (generalized)   . Myalgia and myositis, unspecified   . Other abnormal blood chemistry   . Other malaise and fatigue   . Personal history of arthritis   . Personal history of fall   . Raynaud's syndrome   . Sciatica   . Unspecified vitamin D deficiency     Patient Active Problem List   Diagnosis Date Noted  . Chest pain 11/11/2016  . Asthma 11/11/2016  . MDD (major depressive disorder), recurrent episode (HCC) 05/23/2015  . Allergic rhinitis 11/03/2014  . Paresthesia 09/08/2014  . Peripheral neuropathy 07/14/2014  . Obesity (BMI 30-39.9) 07/14/2014  . Pain in limb 07/13/2014  . Abnormality of gait 07/13/2014  . Fibromyalgia 06/13/2014  . Spinal stenosis 01/27/2013  . HTN  (hypertension) 11/30/2012    Past Surgical History:  Procedure Laterality Date  . ABDOMINAL HYSTERECTOMY  1985      Allergies Patient has no known allergies.  Family History  Problem Relation Age of Onset  . Dementia Mother   . Heart disease Mother   . Hypertension Mother   . Diabetes Mother   . Diabetes Sister   . Cancer Sister        lung  . Cancer Other        Nepher (Mothers side)  . Diabetes Maternal Grandmother   . Arthritis Maternal Grandmother   . Diabetes Cousin        Mother's side  . Arthritis Sister   . Arthritis Cousin        Mother's side     Social History Social History  Substance Use Topics  . Smoking status: Never Smoker  . Smokeless tobacco: Never Used  . Alcohol use No    Review of Systems  Constitutional: No fever/chills Eyes: No visual changes. [name]: No sore throat. Cardiovascular: Positive chest pain and bilateral arm pain.  Respiratory: Denies shortness of breath. Gastrointestinal: No abdominal pain.  No nausea, no vomiting.  No diarrhea.  No constipation. Genitourinary: Negative for dysuria. Musculoskeletal: Negative for back pain. Skin: Negative for rash. Neurological: Negative for headaches, focal weakness or numbness.  10-point ROS otherwise negative.  ____________________________________________   PHYSICAL EXAM:  VITAL SIGNS: ED Triage Vitals  Enc Vitals Group  BP 11/11/16 1144 120/69     Pulse Rate 11/11/16 1144 68     Resp 11/11/16 1144 18     Temp 11/11/16 1144 97.7 F (36.5 C)     Temp Source 11/11/16 1144 Oral     SpO2 11/11/16 1140 100 %     Weight 11/11/16 1147 225 lb (102.1 kg)     Height 11/11/16 1147 5\' 4"  (1.626 m)     Pain Score 11/11/16 1143 2   Constitutional: Alert and oriented. Well appearing and in no acute distress. Eyes: Conjunctivae are normal.  Head: Atraumatic. Nose: No congestion/rhinnorhea. Mouth/Throat: Mucous membranes are moist.  Oropharynx non-erythematous. Neck: No stridor.     Cardiovascular: Normal rate, regular rhythm. Good peripheral circulation. Grossly normal heart sounds.   Respiratory: Normal respiratory effort.  No retractions. Lungs CTAB. Gastrointestinal: Soft and nontender. No distention.  Musculoskeletal: No lower extremity tenderness nor edema. No gross deformities of extremities. Some sternal tenderness to palpation of the anterior chest wall.  Neurologic:  Normal speech and language. No gross focal neurologic deficits are appreciated.  Skin:  Skin is warm, dry and intact. No rash noted.  ____________________________________________   LABS (all labs ordered are listed, but only abnormal results are displayed)  Labs Reviewed  CBC WITH DIFFERENTIAL/PLATELET - Abnormal; Notable for the following:       Result Value   Hemoglobin 11.7 (*)    All other components within normal limits  CBC - Abnormal; Notable for the following:    Hemoglobin 11.8 (*)    All other components within normal limits  BRAIN NATRIURETIC PEPTIDE - Abnormal; Notable for the following:    B Natriuretic Peptide 185.7 (*)    All other components within normal limits  BASIC METABOLIC PANEL  D-DIMER, QUANTITATIVE (NOT AT [facility])  TROPONIN I  CREATININE, SERUM  TROPONIN I  TROPONIN I  I-STAT TROPOININ, ED   ____________________________________________  EKG   EKG Interpretation  Date/Time:  Tuesday Nov 11 2016 11:48:14 EDT Ventricular Rate:  66 PR Interval:    QRS Duration: 138 QT Interval:  415 QTC Calculation: 435 R Axis:   50 Text Interpretation:  Sinus rhythm Nonspecific intraventricular conduction delay Minimal ST depression, inferior leads Similar to prior tracing. No STEMI.  Confirmed by Alona Bene [id number]) on 11/11/2016 11:57:29 AM       ____________________________________________  RADIOLOGY  Dg Chest 2 View  Result Date: 11/11/2016 CLINICAL DATA:  Left-sided chest pain radiating into the back, left neck, and left arm. Symptoms since this morning.  EXAM: CHEST  2 VIEW COMPARISON:  08/18/2015 FINDINGS: The heart is moderately enlarged. Minimal linear atelectasis for scar at the lung bases. Normal vascularity. No pneumothorax. No pleural effusion. IMPRESSION: Cardiomegaly without decompensation. Cardiomegaly has developed since the prior study. Electronically Signed   By: Jolaine Click M.D.   On: 11/11/2016 13:42    ____________________________________________   PROCEDURES  Procedure(s) performed:   Procedures  None ____________________________________________   INITIAL IMPRESSION / ASSESSMENT AND PLAN / ED COURSE  Pertinent labs & imaging results that were available during my care of the patient were reviewed by me and considered in my medical decision making (see chart for details).  Patient presents to the emergency department for evaluation of chest pain. Pain has been intermittent for the past 2 days has become constant and more severe today. Pain improved with nitroglycerin. Patient does have some tenderness to palpation of the anterior chest wall but describes this pain is different than what she is  experiencing over the last several days. Patient has a strong family history of coronary artery disease. Her age and hypertension put her at elevated risk. No prior ACS workup. Plan to apply nitroglycerin ointment for further pain control and obtain labs along with chest x-ray.  Discussed patient's case with hospitalist.  Patient and family (if present) updated with plan. Care transferred to hospitalist service.  I reviewed all nursing notes, vitals, pertinent old records, EKGs, labs, imaging (as available).  ____________________________________________  FINAL CLINICAL IMPRESSION(S) / ED DIAGNOSES  Final diagnoses:  Precordial chest pain     MEDICATIONS GIVEN DURING THIS VISIT:  Medications  amitriptyline (ELAVIL) tablet 50 mg (not administered)  gabapentin (NEURONTIN) capsule 400 mg (not administered)  citalopram  (CELEXA) tablet 20 mg (20 mg Oral Given 11/11/16 1746)  metoprolol tartrate (LOPRESSOR) tablet 50 mg (50 mg Oral Given 11/11/16 1746)  aspirin EC tablet 81 mg (81 mg Oral Given 11/11/16 1741)  acetaminophen (TYLENOL) tablet 650 mg (not administered)  ondansetron (ZOFRAN) injection 4 mg (not administered)  enoxaparin (LOVENOX) injection 40 mg (not administered)  morphine 4 MG/ML injection 2 mg (not administered)  gi cocktail (Maalox,Lidocaine,Donnatal) (not administered)  oxyCODONE (Oxy IR/ROXICODONE) immediate release tablet 15 mg (15 mg Oral Given 11/11/16 1741)  ipratropium-albuterol (DUONEB) 0.5-2.5 (3) MG/3ML nebulizer solution 3 mL (3 mLs Nebulization Given 11/11/16 1519)  ipratropium-albuterol (DUONEB) 0.5-2.5 (3) MG/3ML nebulizer solution (  Not Given 11/11/16 1726)  nitroGLYCERIN (NITROGLYN) 2 % ointment 1 inch (1 inch Topical Given 11/11/16 1401)     NEW OUTPATIENT MEDICATIONS STARTED DURING THIS VISIT:  None   Note:  This document was prepared using Dragon voice recognition software and may include unintentional dictation errors.  Alona Bene, MD Emergency Medicine  Long, Arlyss Repress, MD 11/11/16 Ebony Cargo

## 2016-11-11 NOTE — ED Triage Notes (Signed)
To hallway via EMS.  Onset 9am left sided chest pain radiating to left arm.  Pain worse with movement.  Neck painful when moving.  Pt took ASA x 4.  EMS gave NTG x 1, pain decreased from 6/10 to 2/10.

## 2016-11-11 NOTE — H&P (Signed)
History and Physical    Melinda [name] HOZ:[id number] DOB: [birth year] DOA: 11/11/2016  PCP: Kirt Boys, DO  Patient coming from: home  Chief Complaint: chest pain, left arm pain  HPI: Melinda [name] is a 66 y.o. female with medical history significant of asthma, depression, fibromyalgia, hypertension-treated, spinal stenosis with sciatica who presented to the emergency room with complaints of left-sided chest pain. Patient said that the onset of chest pain was a few hours ago while she was sitting in her room. At that same time she experienced back pain located between shoulder blades and the pain radiated to the left arm and reactive intermittent sensation of near fainting. Patient denied nausea or diaphoresis, but reported worsening of pain with deep breathing and body movements. Patient called EMS and prior to their arrival to 4 aspirins. AMS administered one sublingual nitroglycerin that improved her pain but didn't resolve it.   ED Course: On arrival vital signs were stable, blood work demonstrated normal troponin, hemoglobin 11.7 and hematocrit 37.4%, EKG showed sinus rhythm and no new ischemic changes. Chest x-ray didn't reveal any acute cardiopulmonary process, but demonstrated new onset cardiomegaly compared to the previous x-ray on 08/18/2015  Review of Systems: As per HPI otherwise all other systems reviewed and  are negative  Ambulatory Status: Independent  Past Medical History:  Diagnosis Date  . Asthma   . Bursitis   . Depressive disorder, not elsewhere classified   . Enthesopathy of hip region   . Essential hypertension, benign   . Muscle weakness (generalized)   . Myalgia and myositis, unspecified   . Other abnormal blood chemistry   . Other malaise and fatigue   . Personal history of arthritis   . Personal history of fall   . Raynaud's syndrome   . Sciatica   . Unspecified vitamin D deficiency     Past Surgical History:  Procedure Laterality Date  .  ABDOMINAL HYSTERECTOMY  1985    Social History   Social History  . Marital status: Single    Spouse name: N/A  . Number of children: 3  . Years of education: 59   Occupational History  .      retired   Social History Main Topics  . Smoking status: Never Smoker  . Smokeless tobacco: Never Used  . Alcohol use No  . Drug use: No  . Sexual activity: No   Other Topics Concern  . Not on file   Social History Narrative   Patient lives with her grandson. Patient is retired.   Education some college.   Right handed.    Caffeine two cups daily.    No Known Allergies  Family History  Problem Relation Age of Onset  . Dementia Mother   . Heart disease Mother   . Hypertension Mother   . Diabetes Mother   . Diabetes Sister   . Cancer Sister        lung  . Cancer Other        Nepher (Mothers side)  . Diabetes Maternal Grandmother   . Arthritis Maternal Grandmother   . Diabetes Cousin        Mother's side  . Arthritis Sister   . Arthritis Cousin        Mother's side     Prior to Admission medications   Medication Sig Start Date End Date Taking? Authorizing Provider  albuterol (PROVENTIL HFA;VENTOLIN HFA) 108 (90 Base) MCG/ACT inhaler Inhale 2 puffs into the lungs every 6 (six) hours as needed  for wheezing or shortness of breath. 12/20/15   Kirt Boys, DO  amitriptyline (ELAVIL) 25 MG tablet Take 2 tablets (50 mg total) by mouth at bedtime as needed for sleep. 08/26/16   Kirt Boys, DO  aspirin 81 MG tablet Take 81 mg by mouth daily.    [provider]  celecoxib (CELEBREX) 100 MG capsule TAKE ONE CAPSULE TWICE DAILY FOR PAINS 03/27/16   Reed, Tiffany L, DO  citalopram (CELEXA) 20 MG tablet Take 1 tablet (20 mg total) by mouth daily. 05/28/16   Kirt Boys, DO  cyclobenzaprine (FLEXERIL) 10 MG tablet Take 1 tablet (10 mg total) by mouth 3 (three) times daily as needed for muscle spasms. 08/14/16   Sudie Grumbling, NP  doxepin (SINEQUAN) 50 MG capsule TAKE  2 CAPSULES (100 MG TOTAL) BY MOUTH AT BEDTIME. 01/23/16   Montez Morita, Maxine Glenn, DO  EQ LORATADINE 10 MG tablet TAKE ONE TABLET BY MOUTH ONCE DAILY 04/18/16   Kirt Boys, DO  fluticasone [facility]) 50 MCG/ACT nasal spray Place 2 sprays into both nostrils daily as needed for allergies or rhinitis. 11/14/14   Kirt Boys, DO  gabapentin (NEURONTIN) 400 MG capsule TAKE THREE CAPSULES BY MOUTH TWICE DAILY FOR PAINS 02/05/16   Kirt Boys, DO  metoprolol (LOPRESSOR) 50 MG tablet TAKE ONE TABLET BY MOUTH TWICE DAILY FOR BLOOD PRESSURE 01/23/16   Kirt Boys, DO  oxyCODONE (ROXICODONE) 15 MG immediate release tablet Take 1 tablet (15 mg total) by mouth every 4 (four) hours as needed for pain. 10/22/16   Kimber Relic, MD  vitamin D, CHOLECALCIFEROL, 400 UNITS tablet Take by mouth daily. Take three  Capsules by mouth to equal 1200mg  daily.    [provider]    Physical Exam: Vitals:   11/11/16 1140 11/11/16 1144 11/11/16 1147 11/11/16 1355  BP:  120/69  (!) 126/59  Pulse:  68  63  Resp:  18  (!) 21  Temp:  97.7 F (36.5 C)    TempSrc:  Oral    SpO2: 100% 98%  100%  Weight:   102.1 kg (225 lb)   Height:   5\' 4"  (1.626 m)      General: Appears calm and comfortable Eyes: PERRLA, EOMI, normal lids, iris ENT:  grossly normal hearing, lips & tongue, mucous membranes moist and intact Neck: no lymphoadenopathy, masses or thyromegaly Cardiovascular: RRR, no m/r/g. No JVD, carotid bruits. No LE edema.  Respiratory: bilateral faint wheezes. Normal respiratory effort. No accessory muscle use observed Abdomen: soft, non-tender, non-distended, no organomegaly or masses appreciated. BS present in all quadrants Skin: no rash, ulcers or induration seen on limited exam Musculoskeletal: grossly normal tone BUE/BLE, good ROM, no bony abnormality or joint deformities observed Psychiatric: grossly normal mood and affect, speech fluent and appropriate, alert and oriented x3 Neurologic: CN II-XII  grossly intact, moves all extremities in coordinated fashion, sensation intact  Labs on Admission: I have personally reviewed following labs and imaging studies  CBC, BMP  GFR: Estimated Creatinine Clearance: 71.7 mL/min (by C-G formula based on SCr of 0.91 mg/dL).   Creatinine Clearance: Estimated Creatinine Clearance: 71.7 mL/min (by C-G formula based on SCr of 0.91 mg/dL).    Radiological Exams on Admission: Dg Chest 2 View  Result Date: 11/11/2016 CLINICAL DATA:  Left-sided chest pain radiating into the back, left neck, and left arm. Symptoms since this morning. EXAM: CHEST  2 VIEW COMPARISON:  08/18/2015 FINDINGS: The heart is moderately enlarged. Minimal linear atelectasis for scar at the lung  bases. Normal vascularity. No pneumothorax. No pleural effusion. IMPRESSION: Cardiomegaly without decompensation. Cardiomegaly has developed since the prior study. Electronically Signed   By: Jolaine Click M.D.   On: 11/11/2016 13:42    EKG: Independently reviewed -  Sinus rhythm, no new EKG changes  Assessment/Plan Principal Problem:   Chest pain Active Problems:   HTN (hypertension)   Spinal stenosis   Peripheral neuropathy   MDD (major depressive disorder), recurrent episode (HCC)   Fibromyalgia   Asthma    Chest pain - reproducible to palpation, possible costochondritis. However, her chest Xray demonstrated new onset CM First set troponin was negative, D-dimer normal Will obtain BNP and TTE, continue to monitor on telemetry and cycle cardiac enzymes, continue nitropaste as patient reported some improvement in pain after the paste was applied Patient is on Celebrex bid at home and roxicodone. Continue opioid analgesia and give one dose of Toradol  Asthma - on albuterol inhaler at home and developed wheezing while in the ED Duoneb scheduled and prn started  Chronic pain syndrome associated with fibromyalgia, spinal stenosis and sciatica with peripheral neuropathy Continue  Roxicodone, Neurontin, flexeril as at home  Depression - continue Elavil at night, Celexa     DVT prophylaxis: Lovenox Code Status: full Family Communication: none Disposition Plan: telemetry Consults called: none Admission status: observation   Raymon Mutton, [place] Pager: [phone] [facility]  If 7PM-7AM, please contact night-coverage [email or web address] Password [email or web address]  11/11/2016, 3:09 PM

## 2016-11-12 ENCOUNTER — Observation Stay (HOSPITAL_BASED_OUTPATIENT_CLINIC_OR_DEPARTMENT_OTHER): Payer: Medicare HMO

## 2016-11-12 ENCOUNTER — Observation Stay (HOSPITAL_COMMUNITY): Payer: Medicare HMO

## 2016-11-12 DIAGNOSIS — R0789 Other chest pain: Secondary | ICD-10-CM

## 2016-11-12 DIAGNOSIS — R079 Chest pain, unspecified: Secondary | ICD-10-CM

## 2016-11-12 DIAGNOSIS — I509 Heart failure, unspecified: Secondary | ICD-10-CM | POA: Diagnosis not present

## 2016-11-12 LAB — NM MYOCAR MULTI W/SPECT W/WALL MOTION / EF
Peak HR: 77 {beats}/min
Rest HR: 61 {beats}/min

## 2016-11-12 MED ORDER — REGADENOSON 0.4 MG/5ML IV SOLN
0.4000 mg | Freq: Once | INTRAVENOUS | Status: AC
  Administered 2016-11-12: 0.4 mg via INTRAVENOUS

## 2016-11-12 MED ORDER — TECHNETIUM TC 99M TETROFOSMIN IV KIT
30.0000 | PACK | Freq: Once | INTRAVENOUS | Status: AC | PRN
  Administered 2016-11-12: 30 via INTRAVENOUS

## 2016-11-12 MED ORDER — REGADENOSON 0.4 MG/5ML IV SOLN
INTRAVENOUS | Status: AC
  Filled 2016-11-12: qty 5

## 2016-11-12 MED ORDER — TECHNETIUM TC 99M TETROFOSMIN IV KIT
10.0000 | PACK | Freq: Once | INTRAVENOUS | Status: AC | PRN
  Administered 2016-11-12: 10 via INTRAVENOUS

## 2016-11-12 NOTE — Discharge Summary (Signed)
Physician Discharge Summary  Melinda [name] ZOX:[id number] DOB: Apr 16, 1951 DOA: 11/11/2016  PCP: Kirt Boys, DO  Admit date: 11/11/2016 Discharge date: 11/12/2016   Recommendations for Outpatient Follow-Up:   1.    Discharge Diagnosis:   Principal Problem:   Chest pain Active Problems:   HTN (hypertension)   Spinal stenosis   Peripheral neuropathy   MDD (major depressive disorder), recurrent episode (HCC)   Fibromyalgia   Asthma   Atypical chest pain   Costochondritis   Chronic congestive heart failure [facility])   Discharge disposition:  Home  Discharge Condition: Improved.  Diet recommendation: Low sodium, heart healthy  Wound care: None.   History of Present Illness:   Melinda [name] is a 66 y.o. female with medical history significant of asthma, depression, fibromyalgia, hypertension-treated, spinal stenosis with sciatica who presented to the emergency room with complaints of left-sided chest pain. Patient said that the onset of chest pain was a few hours ago while she was sitting in her room. At that same time she experienced back pain located between shoulder blades and the pain radiated to the left arm and reactive intermittent sensation of near fainting. Patient denied nausea or diaphoresis, but reported worsening of pain with deep breathing and body movements. Patient called EMS and prior to their arrival to 4 aspirins. AMS administered one sublingual nitroglycerin that improved her pain but didn't resolve it.    Hospital Course by Problem:   Chest pain  -Atypical in nature -C neg x 4 -EKG without ischemic changes -stress test read as low risk by cardiology  Hypertension -metoprolol  Obesity Body mass index is 40.17 kg/m.  -encourage weight loss    Medical Consultants:    cards   Discharge Exam:   Vitals:   11/12/16 1422 11/12/16 1510  BP: 113/77 (!) 142/57  Pulse: 67 62  Resp: 18 18  Temp:  98.9 F (37.2 C)   Vitals:   11/12/16  1220 11/12/16 1412 11/12/16 1422 11/12/16 1510  BP:  113/77 113/77 (!) 142/57  Pulse: 75 62 67 62  Resp:   18 18  Temp:    98.9 F (37.2 C)  TempSrc:    Oral  SpO2:   98% 97%  Weight:      Height:        Gen:  NAD   The results of significant diagnostics from this hospitalization (including imaging, microbiology, ancillary and laboratory) are listed below for reference.     Procedures and Diagnostic Studies:   Dg Chest 2 View  Result Date: 11/11/2016 CLINICAL DATA:  Left-sided chest pain radiating into the back, left neck, and left arm. Symptoms since this morning. EXAM: CHEST  2 VIEW COMPARISON:  08/18/2015 FINDINGS: The heart is moderately enlarged. Minimal linear atelectasis for scar at the lung bases. Normal vascularity. No pneumothorax. No pleural effusion. IMPRESSION: Cardiomegaly without decompensation. Cardiomegaly has developed since the prior study. Electronically Signed   By: Jolaine Click M.D.   On: 11/11/2016 13:42   Nm Myocar Multi W/spect W/wall Motion / Ef  Result Date: 11/12/2016 CLINICAL DATA:  66 year old female with chest pain and congestive heart failure. EXAM: MYOCARDIAL IMAGING WITH SPECT (REST AND PHARMACOLOGIC-STRESS) GATED LEFT VENTRICULAR WALL MOTION STUDY LEFT VENTRICULAR EJECTION FRACTION TECHNIQUE: Standard myocardial SPECT imaging was performed after resting intravenous injection of 10 mCi Tc-52m tetrofosmin. Subsequently, intravenous infusion of Lexiscan was performed under the supervision of the Cardiology staff. At peak effect of the drug, 30 mCi Tc-34m tetrofosmin was injected intravenously and standard  myocardial SPECT imaging was performed. Quantitative gated imaging was also performed to evaluate left ventricular wall motion, and estimate left ventricular ejection fraction. COMPARISON:  None. FINDINGS: Perfusion: There is a small region of moderate decreased counts within the mid and apical segment of the anterior wall which improves from stress to rest.  Small region of moderate decreased counts within the mid and apical segment of the inferior septal wall. Wall Motion: Normal left ventricular wall motion. No left ventricular dilation. Left Ventricular Ejection Fraction: 59 % End diastolic volume 93 ml a End systolic volume 39 ml IMPRESSION: 1. Two small regions of moderate reversible ischemia within the anterior wall and inferoseptal wall. 2. Normal left ventricular wall motion. 3. Left ventricular ejection fraction 59% 4. Non invasive risk stratification*: Intermediate *2012 Appropriate Use Criteria for Coronary Revascularization Focused Update: J Am Coll Cardiol. 2012;59(9):857-881. http://content.dementiazones.com.aspx?articleid=[email or web address] These results will be called to the ordering clinician or representative by the Radiologist Assistant, and communication documented in the PACS or zVision Dashboard. Electronically Signed   By: Genevive Bi M.D.   On: 11/12/2016 16:09     Labs:   Basic Metabolic Panel:  Recent Labs Lab 11/11/16 1208 11/11/16 1452  NA 137  --   K 4.3  --   CL 104  --   CO2 25  --   GLUCOSE 93  --   BUN 11  --   CREATININE 0.91 0.85  CALCIUM 8.9  --    GFR Estimated Creatinine Clearance: 78.4 mL/min (by C-G formula based on SCr of 0.85 mg/dL). Liver Function Tests: No results for input(s): AST, ALT, ALKPHOS, BILITOT, PROT, ALBUMIN in the last 168 hours. No results for input(s): LIPASE, AMYLASE in the last 168 hours. No results for input(s): AMMONIA in the last 168 hours. Coagulation profile No results for input(s): INR, PROTIME in the last 168 hours.  CBC:  Recent Labs Lab 11/11/16 1208 11/11/16 1452  WBC 6.1 5.8  NEUTROABS 3.0  --   HGB 11.7* 11.8*  HCT 37.4 37.4  MCV 86.2 86.2  PLT 367 354   Cardiac Enzymes:  Recent Labs Lab 11/11/16 1452 11/11/16 1941 11/11/16 2252  TROPONINI <0.03 <0.03 <0.03   BNP: Invalid input(s): POCBNP CBG: No results for input(s): GLUCAP in the last 168  hours. D-Dimer  Recent Labs  11/11/16 1208  DDIMER 0.29   Hgb A1c No results for input(s): HGBA1C in the last 72 hours. Lipid Profile No results for input(s): CHOL, HDL, LDLCALC, TRIG, CHOLHDL, LDLDIRECT in the last 72 hours. Thyroid function studies No results for input(s): TSH, T4TOTAL, T3FREE, THYROIDAB in the last 72 hours.  Invalid input(s): FREET3 Anemia work up No results for input(s): VITAMINB12, FOLATE, FERRITIN, TIBC, IRON, RETICCTPCT in the last 72 hours. Microbiology No results found for this or any previous visit (from the past 240 hour(s)).   Discharge Instructions:   Discharge Instructions    Diet - low sodium heart healthy    Complete by:  As directed    Increase activity slowly    Complete by:  As directed      Allergies as of 11/12/2016   No Known Allergies     Medication List    TAKE these medications   albuterol 108 (90 Base) MCG/ACT inhaler Commonly known as:  PROVENTIL HFA;VENTOLIN HFA Inhale 2 puffs into the lungs every 6 (six) hours as needed for wheezing or shortness of breath.   amitriptyline 25 MG tablet Commonly known as:  ELAVIL Take 2 tablets (50 mg  total) by mouth at bedtime as needed for sleep. What changed:  when to take this   aspirin 81 MG tablet Take 81 mg by mouth daily.   celecoxib 100 MG capsule Commonly known as:  CELEBREX TAKE ONE CAPSULE TWICE DAILY FOR PAINS   citalopram 20 MG tablet Commonly known as:  CELEXA Take 1 tablet (20 mg total) by mouth daily.   doxepin 50 MG capsule Commonly known as:  SINEQUAN TAKE 2 CAPSULES (100 MG TOTAL) BY MOUTH AT BEDTIME.   EQ LORATADINE 10 MG tablet Generic drug:  loratadine TAKE ONE TABLET BY MOUTH ONCE DAILY   fluticasone 50 MCG/ACT nasal spray Commonly known as:  FLONASE Place 2 sprays into both nostrils daily as needed for allergies or rhinitis.   gabapentin 400 MG capsule Commonly known as:  NEURONTIN TAKE THREE CAPSULES BY MOUTH TWICE DAILY FOR PAINS     metoprolol tartrate 50 MG tablet Commonly known as:  LOPRESSOR TAKE ONE TABLET BY MOUTH TWICE DAILY FOR BLOOD PRESSURE   oxyCODONE 15 MG immediate release tablet Commonly known as:  ROXICODONE Take 1 tablet (15 mg total) by mouth every 4 (four) hours as needed for pain.   vitamin D (CHOLECALCIFEROL) 400 units tablet Take 1,200 Units by mouth daily.      Follow-up Information    Kirt Boys, DO Follow up in 1 week(s).   Specialty:  Internal Medicine Contact information: [address] [place] [address] [phone]            Time coordinating discharge: 25 min  Signed:  Loraine Freid U Sahvanna Mcmanigal   [facility] 11/12/2016, 5:07 PM

## 2016-11-12 NOTE — Progress Notes (Addendum)
Personally reviewed the nuclear images. Mediocre quality perfusion study due to body habitus. The perfusion defects described are mild and do not match coronary anatomy. I suspect that they represent attenuation artifact. Coronary angiography is not justified. OK for echo follow up as outpatient.  Thurmon Fair, MD, [facility] [phone] office [phone] pager

## 2016-11-12 NOTE — Progress Notes (Signed)
See note from Dr. Salena Saner - he had already communicated with [name] about scheduling OP echo and f/u. I also called into patient's room and notified her of result and plan for f/u. She was appreciative of call. Nurse has communicated this to IM as well. Melinda Levenhagen PA-C

## 2016-11-12 NOTE — Consult Note (Signed)
Cardiology Consult    Patient ID: Melinda [name] MRN: [id number], DOB/AGE: 07/18/1950   Admit date: 11/11/2016 Date of Consult: 11/12/2016  Primary Physician: Kirt Boys, DO Reason for Consult: Chest pain Primary Cardiologist: New  Requesting Provider: Dr. Benjamine Mola  History of Present Illness    Melinda [name] is a 66 y.o. female who is being seen today for the evaluation of chest pain at the request of Dr. Benjamine Mola. The patient has a past medical history significant for asthma, depression, fibromyalgia, hypertension and spinal stenosis with sciatica. She has chronic weakness of the legs for over 10 years and uses a walker.  Yesterday morning at around 9 am the patient was sitting having her coffee when she developed a sharp left sided chest pain that went through to her back and radiated to her left neck and down her left arm. Movement seemed to increase the sharp pain. She had some mild shortness of breath but no nausea, lightheadedness, diaphoresis, or palpitations. She called EMS and took 4 aspirin as instructed. EMS gave her a SL NTG which seemed to ease her pain which then became a constant dull throbbing 2-3/10 which is continuing. She arthritis of the right shoulder and right chest and this feels somewhat similar to that pain, but she has not had on the left before and she was concerned for her heart being on that side. This morning she still has a vague discomfort on the left side. She is currently receiving a breathing treatment for complaints of wheezing, although there is no wheezing on auscultation. She has no orthopnea, but she does have to use her inhaler at night sometimes. She has chronic dyspnea on exertion for many years.  She saw a cardiologist in [facility] about 6-7 years ago and it was found that she was on too much metoprolol- 100 mg bid. Her metoprolol was decreased to 50 mg bid and she has had no further cardiac issues. No history of MI or heart failure.    Troponins negative for 4 occurrences. CXR with no acute cardiopulmonary disease. D-dimer is normal.  BNP is mildly elevated at 185.7  Past Medical History   Past Medical History:  Diagnosis Date  . Arthritis    "99% of my body" (11/11/2016)  . Asthma   . Bursitis of both hips   . Cervical scoliosis   . Chronic back pain    "all over my back" (11/11/2016)  . Depressive disorder, not elsewhere classified   . Enthesopathy of hip region   . Environmental allergies    "I take Claritin qd; 365 days/year" (11/11/2016)  . Essential hypertension, benign   . Fibromyalgia   . Frequent falls   . Headache    "at least 1/wk; may last for 2 days or so" (11/11/2016)  . History of blood transfusion 1985   w/hysterectomy  . Lumbar stenosis   . Migraine    "a few/month" (11/11/2016)  . Muscle weakness (generalized)   . Myalgia and myositis, unspecified   . Other abnormal blood chemistry   . Other malaise and fatigue   . Raynaud's syndrome   . Sciatic nerve pain, left   . Sciatica   . Unspecified vitamin D deficiency     Past Surgical History:  Procedure Laterality Date  . ABDOMINAL HYSTERECTOMY  1985  . CESAREAN SECTION  1976; 1979  . SHOULDER OPEN ROTATOR CUFF REPAIR Left      Allergies  No Known Allergies  Inpatient Medications    .  aspirin EC  81 mg Oral Daily  . citalopram  20 mg Oral Daily  . enoxaparin (LOVENOX) injection  40 mg Subcutaneous Q24H  . gabapentin  400 mg Oral BID  . ipratropium-albuterol  3 mL Nebulization TID  . metoprolol tartrate  50 mg Oral BID    Family History    Family History  Problem Relation Age of Onset  . Dementia Mother   . Heart disease Mother   . Hypertension Mother   . Diabetes Mother   . Diabetes Sister   . Cancer Sister        lung  . Cancer Other        Nepher (Mothers side)  . Diabetes Maternal Grandmother   . Arthritis Maternal Grandmother   . Diabetes Cousin        Mother's side  . Arthritis Sister   . Arthritis Cousin         Mother's side     Social History    Social History   Social History  . Marital status: Divorced    Spouse name: N/A  . Number of children: 3  . Years of education: 40   Occupational History  .      retired   Social History Main Topics  . Smoking status: Never Smoker  . Smokeless tobacco: Never Used  . Alcohol use No  . Drug use: No  . Sexual activity: Yes   Other Topics Concern  . Not on file   Social History Narrative   Patient lives with her grandson. Patient is retired.   Education some college.   Right handed.    Caffeine two cups daily.     Review of Systems    General:  No chills, fever, night sweats or weight changes.  Cardiovascular:  Positive for chest pain as above, chronic dyspnea on exertion, No edema, orthopnea, palpitations, paroxysmal nocturnal dyspnea. Dermatological: No rash, lesions/masses Respiratory: No cough, Positive for subjective wheezing Urologic: No hematuria, dysuria Abdominal:   No nausea, vomiting, diarrhea, bright red blood per rectum, melena, or hematemesis Neurologic:  No visual changes, wkns, changes in mental status. All other systems reviewed and are otherwise negative except as noted above.  Physical Exam    Blood pressure 114/74, pulse 76, temperature 98.2 F (36.8 C), temperature source Oral, resp. rate 15, height 5\' 4"  (1.626 m), weight 234 lb (106.1 kg), SpO2 100 %.  General: Pleasant, obese female, NAD Psych: Normal affect. Neuro: Alert and oriented X 3. Moves all extremities spontaneously. HEENT: Normal  Neck: Supple without bruits or JVD. Lungs:  Resp regular and unlabored, CTA. Heart: RRR no s3, s4, or murmurs. Abdomen: Soft, non-tender, non-distended, BS + x 4.  Extremities: No clubbing, cyanosis or edema. DP/PT/Radials 2+ and equal bilaterally.  Labs    Troponin [facility] of Care Test)  Recent Labs  11/11/16 1213  TROPIPOC 0.00    Recent Labs  11/11/16 1452 11/11/16 1941 11/11/16 2252   TROPONINI <0.03 <0.03 <0.03   Lab Results  Component Value Date   WBC 5.8 11/11/2016   HGB 11.8 (L) 11/11/2016   HCT 37.4 11/11/2016   MCV 86.2 11/11/2016   PLT 354 11/11/2016    Recent Labs Lab 11/11/16 1208 11/11/16 1452  NA 137  --   K 4.3  --   CL 104  --   CO2 25  --   BUN 11  --   CREATININE 0.91 0.85  CALCIUM 8.9  --   GLUCOSE 93  --  Lab Results  Component Value Date   CHOL 174 05/28/2016   HDL 54 05/28/2016   LDLCALC 101 (H) 05/28/2016   TRIG 96 05/28/2016   Lab Results  Component Value Date   DDIMER 0.29 11/11/2016     Radiology Studies    Dg Chest 2 View  Result Date: 11/11/2016 CLINICAL DATA:  Left-sided chest pain radiating into the back, left neck, and left arm. Symptoms since this morning. EXAM: CHEST  2 VIEW COMPARISON:  08/18/2015 FINDINGS: The heart is moderately enlarged. Minimal linear atelectasis for scar at the lung bases. Normal vascularity. No pneumothorax. No pleural effusion. IMPRESSION: Cardiomegaly without decompensation. Cardiomegaly has developed since the prior study. Electronically Signed   By: Jolaine Click M.D.   On: 11/11/2016 13:42    EKG & Cardiac Imaging    EKG: Sinus rhythm 66 bpm, non-specific IVCD, QTC 435  Echocardiogram: Pending  Assessment & Plan    Chest pain  -Atypical in nature -Troponins negative X 4.  Electrolytes and kidney function normal. -EKG without ischemic changes -BNP 185.7.  -CXR showed cardiomegaly without decompensation. -CVD risk factors include obesity, hypertension, family history -Will check echo for LV function, wall motion and valves -Will check Lexiscan myoview  Hypertension -Treated with metoprolol tartrate 50 mg bid. Well controlled. -If cardiac workup is normal, may consider alternate antihypertensive in the setting of asthma and chronic DOE  Obesity Body mass index is 40.17 kg/m.  -Spinal stenosis and subsequent leg weakness limiting her physical activity -Advise heart healthy  diet    Signed, Berton Bon, NP-C 11/12/2016, 7:25 AM Pager: [phone]   I have seen and examined the patient along with Berton Bon, NP-C.  I have reviewed the chart, notes and new data.  I agree with NP's note.  Key new complaints: symptoms arte atypical and varied. Some might be anginal, most are clearly musculoskeletal. Unable to assess functional status - very sedentary. Key examination changes: normal CV exam Key new findings / data: nonspecific repol changes on ECG, normal enzymes.   PLAN: Lexiscan Myoview today. If normal/low risk, would not pursue other cardiac testing at this time.  Thurmon Fair, MD, [facility] [phone] 11/12/2016, 9:16 AM

## 2016-11-12 NOTE — Care Management Note (Signed)
Case Management Note  Patient Details  Name: Melinda [name] MRN: [id number] Date of Birth: 1951-01-06  Subjective/Objective:    Chest pain, HTN, spinal Stenosis, peripheral neuropathy                Action/Plan: Discharge Planning: NCM spoke to pt and lives at home alone. States she was independent prior to hospital stay. No NCM needs identified.   PCP Kirt Boys MD  Expected Discharge Date:  11/12/16               Expected Discharge Plan:  Home/Self Care  In-House Referral:  NA  Discharge planning Services  CM Consult  Post Acute Care Choice:  NA Choice offered to:  NA  DME Arranged:  N/A DME Agency:  NA  HH Arranged:  NA HH Agency:  NA  Status of Service:  Completed, signed off  If discussed at Long Length of Stay Meetings, dates discussed:    Additional Comments:  Elliot Cousin, RN 11/12/2016, 5:59 PM

## 2016-11-12 NOTE — Progress Notes (Signed)
Responded to consult for prayer. But when stopped by to visit w/ pt, nurse said she was still in stress testing. Will try again another time.    11/12/16 1300  Clinical Encounter Type  Visited With Health care provider  Visit Type Initial;Psychological support;Spiritual support;Social support  Referral From Nurse   Ephraim Hamburger, Chaplain

## 2016-11-12 NOTE — Care Management Obs Status (Signed)
MEDICARE OBSERVATION STATUS NOTIFICATION   Patient Details  Name: Melinda [name] MRN: [id number] Date of Birth: 1950/08/04   Medicare Observation Status Notification Given:  Yes Delivered notice/explained. Pt declined to sign.    Elliot Cousin, RN 11/12/2016, 5:57 PM

## 2016-11-12 NOTE — Progress Notes (Signed)
   [name] presented for a nuclear stress test today.  No immediate complications.  Stress imaging is pending at this time.  Preliminary EKG findings may be listed in the chart, but the stress test result will not be finalized until perfusion imaging is complete.  Laurann Montana, PA-C 11/12/2016, 12:22 PM

## 2016-11-12 NOTE — Progress Notes (Signed)
Results of Stress test paged to Berton Bon PA with Cardiology.  Colman Cater

## 2016-11-13 ENCOUNTER — Other Ambulatory Visit: Payer: Self-pay | Admitting: Cardiology

## 2016-11-13 DIAGNOSIS — I2510 Atherosclerotic heart disease of native coronary artery without angina pectoris: Secondary | ICD-10-CM

## 2016-11-13 DIAGNOSIS — I2583 Coronary atherosclerosis due to lipid rich plaque: Principal | ICD-10-CM

## 2016-11-20 ENCOUNTER — Other Ambulatory Visit: Payer: Self-pay | Admitting: Internal Medicine

## 2016-11-21 ENCOUNTER — Other Ambulatory Visit: Payer: Self-pay

## 2016-11-21 DIAGNOSIS — M5430 Sciatica, unspecified side: Secondary | ICD-10-CM

## 2016-11-21 DIAGNOSIS — M4800 Spinal stenosis, site unspecified: Secondary | ICD-10-CM

## 2016-11-21 DIAGNOSIS — G894 Chronic pain syndrome: Secondary | ICD-10-CM

## 2016-11-21 DIAGNOSIS — G609 Hereditary and idiopathic neuropathy, unspecified: Secondary | ICD-10-CM

## 2016-11-21 DIAGNOSIS — M797 Fibromyalgia: Secondary | ICD-10-CM

## 2016-11-21 MED ORDER — OXYCODONE HCL 15 MG PO TABS: 15.0000 mg | ORAL_TABLET | ORAL | 0 refills | Status: DC | PRN

## 2016-11-21 NOTE — Telephone Encounter (Signed)
Patient walked in requesting scrip.

## 2016-11-29 DIAGNOSIS — I5042 Chronic combined systolic (congestive) and diastolic (congestive) heart failure: Secondary | ICD-10-CM | POA: Diagnosis not present

## 2016-11-29 DIAGNOSIS — R269 Unspecified abnormalities of gait and mobility: Secondary | ICD-10-CM | POA: Diagnosis not present

## 2016-12-01 ENCOUNTER — Ambulatory Visit (HOSPITAL_COMMUNITY): Payer: No Typology Code available for payment source | Attending: Cardiovascular Disease

## 2016-12-08 ENCOUNTER — Other Ambulatory Visit: Payer: Self-pay | Admitting: Internal Medicine

## 2016-12-19 ENCOUNTER — Other Ambulatory Visit: Payer: Self-pay | Admitting: Internal Medicine

## 2016-12-19 DIAGNOSIS — F332 Major depressive disorder, recurrent severe without psychotic features: Secondary | ICD-10-CM

## 2016-12-22 ENCOUNTER — Other Ambulatory Visit: Payer: Self-pay | Admitting: *Deleted

## 2016-12-22 DIAGNOSIS — G609 Hereditary and idiopathic neuropathy, unspecified: Secondary | ICD-10-CM

## 2016-12-22 DIAGNOSIS — M797 Fibromyalgia: Secondary | ICD-10-CM

## 2016-12-22 DIAGNOSIS — G894 Chronic pain syndrome: Secondary | ICD-10-CM

## 2016-12-22 DIAGNOSIS — M5430 Sciatica, unspecified side: Secondary | ICD-10-CM

## 2016-12-22 DIAGNOSIS — M4800 Spinal stenosis, site unspecified: Secondary | ICD-10-CM

## 2016-12-22 MED ORDER — OXYCODONE HCL 15 MG PO TABS
15.0000 mg | ORAL_TABLET | ORAL | 0 refills | Status: DC | PRN
Start: 2016-12-22 — End: 2017-01-21

## 2016-12-22 NOTE — Telephone Encounter (Signed)
Patient requested and Jiles Prows, son will pick up

## 2016-12-29 DIAGNOSIS — I5042 Chronic combined systolic (congestive) and diastolic (congestive) heart failure: Secondary | ICD-10-CM | POA: Diagnosis not present

## 2016-12-29 DIAGNOSIS — R269 Unspecified abnormalities of gait and mobility: Secondary | ICD-10-CM | POA: Diagnosis not present

## 2017-01-02 ENCOUNTER — Encounter: Payer: Self-pay | Admitting: Internal Medicine

## 2017-01-02 ENCOUNTER — Ambulatory Visit (INDEPENDENT_AMBULATORY_CARE_PROVIDER_SITE_OTHER): Payer: Commercial Managed Care - HMO | Admitting: Internal Medicine

## 2017-01-02 VITALS — BP 130/78 | HR 66 | Temp 97.6°F | Wt 259.0 lb

## 2017-01-02 DIAGNOSIS — J302 Other seasonal allergic rhinitis: Secondary | ICD-10-CM | POA: Diagnosis not present

## 2017-01-02 DIAGNOSIS — R062 Wheezing: Secondary | ICD-10-CM

## 2017-01-02 DIAGNOSIS — I10 Essential (primary) hypertension: Secondary | ICD-10-CM

## 2017-01-02 DIAGNOSIS — Z79899 Other long term (current) drug therapy: Secondary | ICD-10-CM | POA: Diagnosis not present

## 2017-01-02 DIAGNOSIS — G609 Hereditary and idiopathic neuropathy, unspecified: Secondary | ICD-10-CM

## 2017-01-02 DIAGNOSIS — R9439 Abnormal result of other cardiovascular function study: Secondary | ICD-10-CM

## 2017-01-02 DIAGNOSIS — M4800 Spinal stenosis, site unspecified: Secondary | ICD-10-CM

## 2017-01-02 DIAGNOSIS — M2550 Pain in unspecified joint: Secondary | ICD-10-CM | POA: Diagnosis not present

## 2017-01-02 DIAGNOSIS — M797 Fibromyalgia: Secondary | ICD-10-CM

## 2017-01-02 DIAGNOSIS — R7303 Prediabetes: Secondary | ICD-10-CM

## 2017-01-02 DIAGNOSIS — H04123 Dry eye syndrome of bilateral lacrimal glands: Secondary | ICD-10-CM

## 2017-01-02 DIAGNOSIS — R682 Dry mouth, unspecified: Secondary | ICD-10-CM

## 2017-01-02 LAB — COMPLETE METABOLIC PANEL WITH GFR
ALT: 7 U/L (ref 6–29)
AST: 12 U/L (ref 10–35)
Albumin: 3.9 g/dL (ref 3.6–5.1)
Alkaline Phosphatase: 69 U/L (ref 33–130)
BUN: 13 mg/dL (ref 7–25)
CO2: 26 mmol/L (ref 20–31)
Calcium: 9 mg/dL (ref 8.6–10.4)
Chloride: 103 mmol/L (ref 98–110)
Creat: 0.89 mg/dL (ref 0.50–0.99)
GFR, Est African American: 79 mL/min (ref >=60)
GFR, Est Non African American: 68 mL/min (ref >=60)
Glucose, Bld: 77 mg/dL (ref 65–99)
Potassium: 4.4 mmol/L (ref 3.5–5.3)
Sodium: 138 mmol/L (ref 135–146)
Total Bilirubin: 0.7 mg/dL (ref 0.2–1.2)
Total Protein: 6.6 g/dL (ref 6.1–8.1)

## 2017-01-02 LAB — LIPID PANEL
Cholesterol: 178 mg/dL (ref <200)
HDL: 55 mg/dL (ref >50)
LDL Cholesterol: 101 mg/dL — ABNORMAL HIGH (ref <100)
Total CHOL/HDL Ratio: 3.2 Ratio (ref <5.0)
Triglycerides: 110 mg/dL (ref <150)
VLDL: 22 mg/dL (ref <30)

## 2017-01-02 MED ORDER — ALBUTEROL SULFATE HFA 108 (90 BASE) MCG/ACT IN AERS: 2.0000 | INHALATION_SPRAY | Freq: Four times a day (QID) | RESPIRATORY_TRACT | 3 refills | Status: DC | PRN

## 2017-01-02 MED ORDER — LORATADINE 10 MG PO TABS: 10.0000 mg | ORAL_TABLET | Freq: Every day | ORAL | 1 refills | Status: DC

## 2017-01-02 MED ORDER — FLUTICASONE PROPIONATE 50 MCG/ACT NA SUSP: 2.0000 | Freq: Every day | NASAL | 6 refills | Status: DC | PRN

## 2017-01-02 MED ORDER — GABAPENTIN 400 MG PO CAPS: ORAL_CAPSULE | ORAL | 1 refills | Status: DC
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
## 2017-01-02 NOTE — Patient Instructions (Addendum)
May take OTC biotin for dry mouth. May also swish and spit 1 tsp olive oil as needed for dry mouth  Use artificial tears for dry eyes as needed. Recommend follow up with eye specialist for regular eye exam  Change gabapentin to take 3 caps 2 times daily AND 2 caps at bedtime (TOTAL 3200 mg per day)  Continue other medications as ordered  Will call with lab and [place] results  Follow up in 3 mos for HTN, CP, neuropathy

## 2017-01-03 LAB — HEMOGLOBIN A1C
Hgb A1c MFr Bld: 5.3 % (ref <5.7)
Mean Plasma Glucose: 105 mg/dL

## 2017-01-08 ENCOUNTER — Telehealth: Payer: Self-pay | Admitting: *Deleted

## 2017-01-08 NOTE — Telephone Encounter (Signed)
Patient called and left message on Clinical Intake stating she is suppose to have a Echocardiogram scheduled.   Checked chart and in appointment section they have tried calling patient to schedule and had left message for her to return call.   Called patient back and LM stating that [facility] tried calling to schedule appointment. Gave number [phone] for her to call them to schedule the appointment.

## 2017-01-15 ENCOUNTER — Other Ambulatory Visit: Payer: Self-pay | Admitting: Internal Medicine

## 2017-01-16 NOTE — Telephone Encounter (Signed)
Medication was taking off med list 07/2016, ok to fill for that quantity?

## 2017-01-19 NOTE — Telephone Encounter (Signed)
Ok

## 2017-01-20 ENCOUNTER — Other Ambulatory Visit: Payer: Self-pay | Admitting: *Deleted

## 2017-01-20 MED ORDER — METOPROLOL TARTRATE 50 MG PO TABS: ORAL_TABLET | ORAL | 3 refills | Status: DC

## 2017-01-20 NOTE — Telephone Encounter (Signed)
Patient called and stated that [facility] has not received her Rx for refill on Metoprolol.  Rx was faxed to [facility] on 7/20. Refaxed.   Patient asked about a refill on Robaxin, but medication is not in current medication list. It is listed in her history and noted to be completed in 2017. Patient will follow up at next appointment.

## 2017-01-21 ENCOUNTER — Other Ambulatory Visit: Payer: Self-pay | Admitting: *Deleted

## 2017-01-21 DIAGNOSIS — G609 Hereditary and idiopathic neuropathy, unspecified: Secondary | ICD-10-CM

## 2017-01-21 DIAGNOSIS — M4800 Spinal stenosis, site unspecified: Secondary | ICD-10-CM

## 2017-01-21 DIAGNOSIS — G894 Chronic pain syndrome: Secondary | ICD-10-CM

## 2017-01-21 DIAGNOSIS — M797 Fibromyalgia: Secondary | ICD-10-CM

## 2017-01-21 DIAGNOSIS — M5430 Sciatica, unspecified side: Secondary | ICD-10-CM

## 2017-01-21 MED ORDER — OXYCODONE HCL 15 MG PO TABS: 15.0000 mg | ORAL_TABLET | ORAL | 0 refills | Status: DC | PRN

## 2017-01-21 NOTE — Telephone Encounter (Signed)
Patient called requested refill. Printed.

## 2017-01-22 NOTE — Telephone Encounter (Signed)
Please clarify is it ok to add back on med list and to fill at this quantity????

## 2017-01-22 NOTE — Telephone Encounter (Signed)
REFILL AS WRITTEN

## 2017-01-23 ENCOUNTER — Other Ambulatory Visit: Payer: Self-pay

## 2017-01-23 ENCOUNTER — Ambulatory Visit (HOSPITAL_COMMUNITY): Payer: Medicare HMO | Attending: Cardiology

## 2017-01-23 DIAGNOSIS — I10 Essential (primary) hypertension: Secondary | ICD-10-CM | POA: Insufficient documentation

## 2017-01-23 DIAGNOSIS — R9439 Abnormal result of other cardiovascular function study: Secondary | ICD-10-CM | POA: Insufficient documentation

## 2017-01-23 DIAGNOSIS — Z8249 Family history of ischemic heart disease and other diseases of the circulatory system: Secondary | ICD-10-CM | POA: Insufficient documentation

## 2017-01-23 DIAGNOSIS — I272 Pulmonary hypertension, unspecified: Secondary | ICD-10-CM

## 2017-01-23 DIAGNOSIS — I340 Nonrheumatic mitral (valve) insufficiency: Secondary | ICD-10-CM | POA: Insufficient documentation

## 2017-01-23 DIAGNOSIS — Z6841 Body Mass Index (BMI) 40.0 and over, adult: Secondary | ICD-10-CM | POA: Insufficient documentation

## 2017-01-29 DIAGNOSIS — I5042 Chronic combined systolic (congestive) and diastolic (congestive) heart failure: Secondary | ICD-10-CM | POA: Diagnosis not present

## 2017-01-29 DIAGNOSIS — R269 Unspecified abnormalities of gait and mobility: Secondary | ICD-10-CM | POA: Diagnosis not present

## 2017-02-13 ENCOUNTER — Other Ambulatory Visit: Payer: Self-pay | Admitting: *Deleted

## 2017-02-13 DIAGNOSIS — F332 Major depressive disorder, recurrent severe without psychotic features: Secondary | ICD-10-CM

## 2017-02-13 MED ORDER — CITALOPRAM HYDROBROMIDE 20 MG PO TABS: 20.0000 mg | ORAL_TABLET | Freq: Every day | ORAL | 3 refills | Status: DC

## 2017-02-13 NOTE — Telephone Encounter (Signed)
Patient requested rx to be sent to [facility]

## 2017-02-16 ENCOUNTER — Ambulatory Visit: Payer: Self-pay | Admitting: Nurse Practitioner

## 2017-02-17 ENCOUNTER — Telehealth: Payer: Self-pay

## 2017-02-17 DIAGNOSIS — M4800 Spinal stenosis, site unspecified: Secondary | ICD-10-CM

## 2017-02-17 DIAGNOSIS — M5430 Sciatica, unspecified side: Secondary | ICD-10-CM

## 2017-02-17 DIAGNOSIS — G894 Chronic pain syndrome: Secondary | ICD-10-CM

## 2017-02-17 DIAGNOSIS — G609 Hereditary and idiopathic neuropathy, unspecified: Secondary | ICD-10-CM

## 2017-02-17 DIAGNOSIS — M797 Fibromyalgia: Secondary | ICD-10-CM

## 2017-02-17 NOTE — Telephone Encounter (Signed)
I checked [place] database again and patient with compliant fill history. (address that we have on file and [place] database address for patient are different)

## 2017-02-17 NOTE — Telephone Encounter (Signed)
Message left on clinical intake voicemail:   Patient left message requesting to cancel pending appointment with Dr.Carter on 04/07/17 (no reason given). Patient also stated that she is due for her Oxycodone refill on the 25th, grandson Richarda Overlie will pick up on Friday.  FYI: Patient no showed for acute appointment with [facility] yesterday.  I reviewed [place] database and was unable to confirm fill history (blank). Dr.Carter, please double check (per Dr.Reed all controlled substance refills require that we check the  database to confirm compliance)   Left message on voicemail for patient to return call when available, reason for call: Inquire about why we are cancelling appointment. (as of right now appointment still scheduled)

## 2017-02-18 NOTE — Telephone Encounter (Signed)
Will not ok to  refill medication if she does not have pending appt.

## 2017-02-18 NOTE — Telephone Encounter (Signed)
Left message on voicemail for patient to return call when available   

## 2017-02-19 NOTE — Telephone Encounter (Signed)
Left message on voicemail for patient to return call when available   

## 2017-02-19 NOTE — Telephone Encounter (Signed)
Patient called and left message on clinical intake line stating that she needs her Rx printed for pick up on Friday.  Tried calling patient back and LMOM to return call.

## 2017-02-20 MED ORDER — OXYCODONE HCL 15 MG PO TABS: 15.0000 mg | ORAL_TABLET | ORAL | 0 refills | Status: DC | PRN

## 2017-02-20 NOTE — Addendum Note (Signed)
Addended by: Maurice Small on: 02/20/2017 10:20 AM   Modules accepted: Orders

## 2017-02-20 NOTE — Telephone Encounter (Signed)
Spoke with patient, patient thought October appointment was for knee pain and she does not need to follow-up on knee pain.   Patient informed that October appointment is for a 3 month follow-up. Patient states she will keep appointment.  Patient mentioned how dissatisfied she was with her last appointment, patient states she did not get to address all her concerns with Dr.Carter, I apologized   Patient will have her grandson pick up rx about 4-4:30 pm.

## 2017-02-23 ENCOUNTER — Telehealth: Payer: Self-pay | Admitting: Internal Medicine

## 2017-02-23 NOTE — Telephone Encounter (Signed)
Spoke with patient's son. He stated that he would like for his mother to be seen my MW this week since he is in town and wanted to talk to the doctor himself. Advised him that MW was completely booked for this week. Advised him to check periodically if necessary to see if someone had cancelled an appt. He verbalized understanding. Nothing else needed at time of call.

## 2017-02-25 ENCOUNTER — Ambulatory Visit: Payer: Medicare HMO | Admitting: Internal Medicine

## 2017-03-12 ENCOUNTER — Telehealth: Payer: Self-pay | Admitting: Internal Medicine

## 2017-03-12 ENCOUNTER — Encounter: Payer: Self-pay | Admitting: Internal Medicine

## 2017-03-12 ENCOUNTER — Other Ambulatory Visit (INDEPENDENT_AMBULATORY_CARE_PROVIDER_SITE_OTHER): Payer: Medicare HMO

## 2017-03-12 ENCOUNTER — Ambulatory Visit (INDEPENDENT_AMBULATORY_CARE_PROVIDER_SITE_OTHER): Payer: Medicare HMO | Admitting: Internal Medicine

## 2017-03-12 VITALS — BP 136/76 | HR 77 | Ht 64.0 in | Wt 244.5 lb

## 2017-03-12 DIAGNOSIS — I272 Pulmonary hypertension, unspecified: Secondary | ICD-10-CM | POA: Diagnosis not present

## 2017-03-12 DIAGNOSIS — J45991 Cough variant asthma: Secondary | ICD-10-CM | POA: Diagnosis not present

## 2017-03-12 LAB — SEDIMENTATION RATE: Sed Rate: 20 mm/hr (ref 0–30)

## 2017-03-12 NOTE — Patient Instructions (Addendum)
Please see patient coordinator before you leave today  to schedule overnight oximetry room air   Please remember to go to the lab department downstairs in the basement  for your tests - we will call you with the results when they are available.      Please schedule a follow up office visit in 4 weeks, sooner if needed with pfts on return

## 2017-03-12 NOTE — Telephone Encounter (Signed)
ONO order had no description on how to be performed.  Per today's OV note ONO is to be performed on room air.  New order placed with specifications.  Nothing further needed.

## 2017-03-12 NOTE — Telephone Encounter (Signed)
Barbara Cower from [facility] returning call [phone] ext [phone]

## 2017-03-12 NOTE — Assessment & Plan Note (Addendum)
Echo 01/23/17 Left ventricle: The cavity size was normal. Systolic function was   normal. The estimated ejection fraction was in the range of 55%   to 60%. Wall motion was normal; there were no regional wall   motion abnormalities. Features are consistent with a pseudonormal   left ventricular filling pattern, with concomitant abnormal   relaxation and increased filling pressure (grade 2 diastolic   dysfunction). - Mitral valve: There was mild regurgitation. - Left atrium: Anterior-posterior dimension: 42 mm. - Pulmonary arteries: PA peak pressure: 46 mm Hg  Scleroderma labs 03/12/2017 >>> neg/ esr  20   Pattern is most c/w secondary PH from elevated RH pressures though with MO could also have occult PE, noct hypoxemia and with raynaud's could also have PSS so need to complete the w/u starting with ono on RA and pfts/ v/q and in meantime work to keep bp down and adequately diuresed   Total time devoted to counseling  > 50 % of initial 60 min office visit:  review case with pt/ discussion of options/alternatives/ personally creating written customized instructions  in presence of pt  then going over those specific  Instructions directly with the pt including how to use all of the meds but in particular covering each new medication in detail and the difference between the maintenance= "automatic" meds and the prns using an action plan format for the latter (If this problem/symptom => do that organization reading Left to right).  Please see AVS from this visit for a full list of these instructions which I personally wrote for this pt and  are unique to this visit.

## 2017-03-12 NOTE — Progress Notes (Signed)
Subjective:     Patient ID: Melinda [name], female   DOB: 08/25/1950,    MRN: [id number]  HPI  8 yobf never smoker referred to pulmonary clinic 03/12/2017 by Dr   Montez Morita for eval ? PH sp admit  Admit date: 11/11/2016 Discharge date: 11/12/2016    Discharge Diagnosis:   Principal Problem:   Chest pain   HTN (hypertension)   Spinal stenosis   Peripheral neuropathy   MDD (major depressive disorder), recurrent episode (HCC)   Fibromyalgia   Asthma   Atypical chest pain   Costochondritis   Chronic congestive heart failure (HCC)   History of Present Illness:   Melinda Mcilwainis a 66 y.o.femalewith medical history significant of asthma, depression, fibromyalgia, hypertension-treated, spinal stenosis with sciatica who presented to the emergency room with complaints of left-sided chest pain. Patient said that the onset of chest pain was a few hours ago while she was sitting in her room. At that same time she experienced back pain located between shoulder blades and the pain radiated to the left arm and reactive intermittent sensation of near fainting. Patient denied nausea or diaphoresis, but reported worsening of pain with deep breathing and body movements. Patient called EMS and prior to their arrival to 4 aspirins. AMS administered one sublingual nitroglycerin that improved her pain but didn't resolve it.    Hospital Course by Problem:   Chest pain -Atypical in nature -C neg x 4 -EKG without ischemic changes -stress test read as low risk by cardiology  Hypertension -metoprolol  Obesity Body mass index is 40.17 kg/m. -encourage weight loss     03/12/2017 1st  Pulmonary office visit/ Lorenzo Pereyra   Chief Complaint  Patient presents with  . pulmonary consult    Dr. Montez Morita referred patient. pt states she is having nasal issues as well as a non productive cought.   dx with fibromyalgia/raynauds around 2005 by [place] [facility] never saw rheumatologist  Then  sob x 2012 rx albuterol helped but just used once a month as neb  Then sob did not respond as well while still in [place] and moved to [place] in 2014 and started using hfa more frequently avg one saba per month ventolin Not limited by breathing from desired activities  But by legs  Presently does 6-7/10= .6 mile slowly at Y slow pace x 3 months x 3 -5 x per  Week  Has not had any recurrent cp's  Cough is daytime, mild and dry x months indolent onset/ waxes and wanes s pattern    No obvious day to day or daytime variability or assoc excess/ purulent sputum or mucus plugs or hemoptysis or chest tightness, subjective wheeze or overt sinus or hb symptoms. No unusual exp hx or h/o childhood pna/ asthma or knowledge of premature birth.  Sleeping ok flat without nocturnal  or early am exacerbation  of respiratory  c/o's or need for noct saba. Also denies any obvious fluctuation of symptoms with weather or environmental changes or other aggravating or alleviating factors except as outlined above   Current Allergies, Complete Past Medical History, Past Surgical History, Family History, and Social History were reviewed in [employer] record.  ROS  The following are not active complaints unless bolded sore throat, dysphagia, dental problems, itching, sneezing,  nasal congestion or disharge of excess mucus or purulent secretions, ear ache,   fever, chills, sweats, unintended wt loss or wt gain, classically pleuritic or exertional cp,  orthopnea pnd or leg swelling, presyncope, palpitations,  abdominal pain, anorexia, nausea, vomiting, diarrhea  or change in bowel habits or bladder habits, change in stools or change in urine, dysuria, hematuria,  rash, arthralgias, visual complaints, headache, numbness, weakness or ataxia or problems with walking or coordination,  change in mood/affect or memory.        Current Meds  Medication Sig  . albuterol (PROVENTIL HFA;VENTOLIN HFA) 108 (90 Base) MCG/ACT  inhaler Inhale 2 puffs into the lungs every 6 (six) hours as needed for wheezing or shortness of breath.  Marland Kitchen amitriptyline (ELAVIL) 25 MG tablet TAKE TWO TABLETS AT BEDTIME FOR REST  . aspirin 81 MG tablet Take 81 mg by mouth daily.  . celecoxib (CELEBREX) 100 MG capsule TAKE ONE CAPSULE TWICE DAILY FOR PAINS  . citalopram (CELEXA) 20 MG tablet Take 1 tablet (20 mg total) by mouth daily.  Marland Kitchen doxepin (SINEQUAN) 50 MG capsule TAKE 2 CAPSULES (100 MG TOTAL) BY MOUTH AT BEDTIME.  . fluticasone (FLONASE) 50 MCG/ACT nasal spray Place 2 sprays into both nostrils daily as needed for allergies or rhinitis.  Marland Kitchen gabapentin (NEURONTIN) 400 MG capsule Take 3 caps po BID, 2 caps po qHS for neuropathy  . loratadine (EQ LORATADINE) 10 MG tablet Take 1 tablet (10 mg total) by mouth daily.  . methocarbamol (ROBAXIN) 500 MG tablet TAKE ONE TABLET EVERY 8 HOURS AS NEEDED FOR MUSCLE SPASMS  . metoprolol tartrate (LOPRESSOR) 50 MG tablet TAKE ONE TABLET TWICE DAILY FOR BLOOD PRESSURE  . oxyCODONE (ROXICODONE) 15 MG immediate release tablet Take 1 tablet (15 mg total) by mouth every 4 (four) hours as needed for pain.  . vitamin D, CHOLECALCIFEROL, 400 UNITS tablet Take 1,200 Units by mouth daily.                Review of Systems     Objective:   Physical Exam Obese bf nad  Wt Readings from Last 3 Encounters:  03/12/17 244 lb 8 oz (110.9 kg)  01/02/17 259 lb (117.5 kg)  11/12/16 234 lb (106.1 kg)    Vital signs reviewed  - Note on arrival 02 sats  96% on RA     HEENT: nl dentition, turbinates bilaterally, and oropharynx. Nl external ear canals without cough reflex   NECK :  without JVD/Nodes/TM/ nl carotid upstrokes bilaterally   LUNGS: no acc muscle use,  Nl contour chest which is clear to A and P bilaterally without cough on insp or exp maneuvers   CV:  RRR  no s3 or murmur or increase in P2, and  1+ bilateral lower ext sym edema   ABD:  soft and nontender with nl inspiratory excursion in the  supine position. No bruits or organomegaly appreciated, bowel sounds nl  MS:  Nl gait/ ext warm without deformities, calf tenderness, cyanosis or clubbing No obvious joint restrictions   SKIN: warm and dry without lesions    NEURO:  alert, approp, nl sensorium with  no motor or cerebellar deficits apparent.      Lab Results  Component Value Date   ESRSEDRATE 20 03/12/2017   ESRSEDRATE 8 07/13/2014   ESRSEDRATE 18 11/30/2012     Labs ordered 03/12/2017   Scleroderma screening     I personally reviewed images and agree with radiology impression as follows:  CXR:   11/11/16  Cardiomegaly without decompensation. Cardiomegaly has developed since the prior study.     Assessment:

## 2017-03-12 NOTE — Telephone Encounter (Signed)
I called Barbara Cower at [facility] and left a message to call back to advise on what we need to do with the Rx for ONO. I will await his phone call.

## 2017-03-13 LAB — CYCLIC CITRUL PEPTIDE ANTIBODY, IGG: Cyclic Citrullin Peptide Ab: 17 UNITS

## 2017-03-13 LAB — ANA: Anti Nuclear Antibody(ANA): NEGATIVE

## 2017-03-13 LAB — RHEUMATOID FACTOR: Rhuematoid fact SerPl-aCnc: <14 IU/mL (ref <14)

## 2017-03-13 LAB — ANTI-SCLERODERMA ANTIBODY: Scleroderma (Scl-70) (ENA) Antibody, IgG: <1 AI

## 2017-03-14 DIAGNOSIS — J45991 Cough variant asthma: Secondary | ICD-10-CM | POA: Insufficient documentation

## 2017-03-14 NOTE — Assessment & Plan Note (Signed)
Overusing saba at baseline, rec she just take it when she really needs it and return for pfts prior to am dosing for before and after pfts

## 2017-03-14 NOTE — Assessment & Plan Note (Signed)
Body mass index is 41.97 kg/m.  -  trending down, encouraged  Lab Results  Component Value Date   TSH 1.37 05/28/2016     Contributing to gerd risk/ doe/reviewed the need and the process to achieve and maintain neg calorie balance > defer f/u primary care including intermittently monitoring thyroid status

## 2017-03-16 NOTE — Progress Notes (Signed)
Spoke with pt and notified of results per Dr. Wert. Pt verbalized understanding and denied any questions. 

## 2017-03-24 ENCOUNTER — Other Ambulatory Visit: Payer: Self-pay | Admitting: *Deleted

## 2017-03-24 DIAGNOSIS — M5430 Sciatica, unspecified side: Secondary | ICD-10-CM

## 2017-03-24 DIAGNOSIS — M4800 Spinal stenosis, site unspecified: Secondary | ICD-10-CM

## 2017-03-24 DIAGNOSIS — G894 Chronic pain syndrome: Secondary | ICD-10-CM

## 2017-03-24 DIAGNOSIS — M797 Fibromyalgia: Secondary | ICD-10-CM

## 2017-03-24 DIAGNOSIS — G609 Hereditary and idiopathic neuropathy, unspecified: Secondary | ICD-10-CM

## 2017-03-24 MED ORDER — OXYCODONE HCL 15 MG PO TABS: 15.0000 mg | ORAL_TABLET | ORAL | 0 refills | Status: DC | PRN

## 2017-03-24 NOTE — Telephone Encounter (Signed)
Patient requested NCCSRS Database Checked.  

## 2017-04-07 ENCOUNTER — Ambulatory Visit: Payer: Commercial Managed Care - HMO | Admitting: Internal Medicine

## 2017-04-10 ENCOUNTER — Other Ambulatory Visit: Payer: Self-pay | Admitting: Internal Medicine

## 2017-04-10 DIAGNOSIS — R0600 Dyspnea, unspecified: Secondary | ICD-10-CM

## 2017-04-10 NOTE — Progress Notes (Unsigned)
PFT done today. 

## 2017-04-13 ENCOUNTER — Ambulatory Visit: Payer: Medicare HMO | Admitting: Internal Medicine

## 2017-04-13 ENCOUNTER — Ambulatory Visit (INDEPENDENT_AMBULATORY_CARE_PROVIDER_SITE_OTHER): Payer: Medicare HMO | Admitting: Internal Medicine

## 2017-04-13 DIAGNOSIS — R0600 Dyspnea, unspecified: Secondary | ICD-10-CM

## 2017-04-13 LAB — PULMONARY FUNCTION TEST
DL/VA % pred: 82 %
DL/VA: 4.07 ml/min/mmHg/L
DLCO cor % pred: 60 %
DLCO cor: 15.53 ml/min/mmHg
DLCO unc % pred: 57 %
DLCO unc: 14.6 ml/min/mmHg
FEF 25-75 Post: 1.95 L/sec
FEF 25-75 Pre: 2.22 L/sec
FEF2575-%Change-Post: -12 %
FEF2575-%Pred-Post: 100 %
FEF2575-%Pred-Pre: 114 %
FEV1-%Change-Post: -2 %
FEV1-%Pred-Post: 86 %
FEV1-%Pred-Pre: 88 %
FEV1-Post: 1.76 L
FEV1-Pre: 1.81 L
FEV1FVC-%Change-Post: -3 %
FEV1FVC-%Pred-Pre: 110 %
FEV6-%Change-Post: 3 %
FEV6-%Pred-Post: 83 %
FEV6-%Pred-Pre: 81 %
FEV6-Post: 2.12 L
FEV6-Pre: 2.05 L
FEV6FVC-%Change-Post: 2 %
FEV6FVC-%Pred-Post: 103 %
FEV6FVC-%Pred-Pre: 100 %
FVC-%Change-Post: 0 %
FVC-%Pred-Post: 81 %
FVC-%Pred-Pre: 80 %
FVC-Post: 2.12 L
FVC-Pre: 2.1 L
Post FEV1/FVC ratio: 83 %
Post FEV6/FVC ratio: 100 %
Pre FEV1/FVC ratio: 86 %
Pre FEV6/FVC Ratio: 98 %
RV % pred: 78 %
RV: 1.69 L
TLC % pred: 78 %
TLC: 4.09 L

## 2017-04-13 NOTE — Patient Instructions (Signed)
PFT done today. 

## 2017-04-17 ENCOUNTER — Ambulatory Visit: Payer: Medicare HMO | Admitting: Internal Medicine

## 2017-04-17 NOTE — Progress Notes (Signed)
LMTCB

## 2017-04-20 NOTE — Progress Notes (Signed)
LMTCB

## 2017-04-21 NOTE — Progress Notes (Signed)
LMTCB

## 2017-04-23 ENCOUNTER — Other Ambulatory Visit: Payer: Self-pay | Admitting: *Deleted

## 2017-04-23 DIAGNOSIS — M4800 Spinal stenosis, site unspecified: Secondary | ICD-10-CM

## 2017-04-23 DIAGNOSIS — G609 Hereditary and idiopathic neuropathy, unspecified: Secondary | ICD-10-CM

## 2017-04-23 DIAGNOSIS — G894 Chronic pain syndrome: Secondary | ICD-10-CM

## 2017-04-23 DIAGNOSIS — M797 Fibromyalgia: Secondary | ICD-10-CM

## 2017-04-23 DIAGNOSIS — M5430 Sciatica, unspecified side: Secondary | ICD-10-CM

## 2017-04-23 MED ORDER — OXYCODONE HCL 15 MG PO TABS: 15.0000 mg | ORAL_TABLET | ORAL | 0 refills | Status: DC | PRN

## 2017-04-23 NOTE — Telephone Encounter (Signed)
Patient requested and will pick up NCCSRS Database Verified.  

## 2017-04-24 NOTE — Progress Notes (Signed)
LMTCB

## 2017-05-01 ENCOUNTER — Ambulatory Visit: Payer: Medicare HMO | Admitting: Internal Medicine

## 2017-05-12 ENCOUNTER — Other Ambulatory Visit: Payer: Self-pay | Admitting: Internal Medicine

## 2017-05-12 DIAGNOSIS — R062 Wheezing: Secondary | ICD-10-CM

## 2017-05-13 ENCOUNTER — Other Ambulatory Visit: Payer: Self-pay | Admitting: *Deleted

## 2017-05-13 DIAGNOSIS — M4800 Spinal stenosis, site unspecified: Secondary | ICD-10-CM

## 2017-05-13 DIAGNOSIS — G629 Polyneuropathy, unspecified: Secondary | ICD-10-CM

## 2017-05-13 MED ORDER — DOXEPIN HCL 50 MG PO CAPS: 100.0000 mg | ORAL_CAPSULE | Freq: Every day | ORAL | 3 refills | Status: DC

## 2017-05-13 NOTE — Telephone Encounter (Signed)
Patient requested to be sent to [facility]. Faxed.

## 2017-05-20 ENCOUNTER — Ambulatory Visit: Payer: Medicare HMO | Admitting: Internal Medicine

## 2017-05-20 ENCOUNTER — Encounter: Payer: Self-pay | Admitting: Internal Medicine

## 2017-05-20 VITALS — BP 126/68 | HR 70 | Temp 97.7°F | Ht 64.0 in | Wt 243.0 lb

## 2017-05-20 DIAGNOSIS — J0100 Acute maxillary sinusitis, unspecified: Secondary | ICD-10-CM | POA: Diagnosis not present

## 2017-05-20 DIAGNOSIS — I10 Essential (primary) hypertension: Secondary | ICD-10-CM

## 2017-05-20 DIAGNOSIS — G894 Chronic pain syndrome: Secondary | ICD-10-CM | POA: Diagnosis not present

## 2017-05-20 DIAGNOSIS — M4800 Spinal stenosis, site unspecified: Secondary | ICD-10-CM | POA: Diagnosis not present

## 2017-05-20 DIAGNOSIS — G609 Hereditary and idiopathic neuropathy, unspecified: Secondary | ICD-10-CM | POA: Diagnosis not present

## 2017-05-20 DIAGNOSIS — M5430 Sciatica, unspecified side: Secondary | ICD-10-CM

## 2017-05-20 DIAGNOSIS — Z1211 Encounter for screening for malignant neoplasm of colon: Secondary | ICD-10-CM | POA: Diagnosis not present

## 2017-05-20 DIAGNOSIS — Z1231 Encounter for screening mammogram for malignant neoplasm of breast: Secondary | ICD-10-CM | POA: Diagnosis not present

## 2017-05-20 DIAGNOSIS — Z1239 Encounter for other screening for malignant neoplasm of breast: Secondary | ICD-10-CM

## 2017-05-20 DIAGNOSIS — J45991 Cough variant asthma: Secondary | ICD-10-CM

## 2017-05-20 DIAGNOSIS — I272 Pulmonary hypertension, unspecified: Secondary | ICD-10-CM | POA: Diagnosis not present

## 2017-05-20 DIAGNOSIS — J302 Other seasonal allergic rhinitis: Secondary | ICD-10-CM | POA: Diagnosis not present

## 2017-05-20 DIAGNOSIS — M797 Fibromyalgia: Secondary | ICD-10-CM

## 2017-05-20 DIAGNOSIS — F332 Major depressive disorder, recurrent severe without psychotic features: Secondary | ICD-10-CM

## 2017-05-20 DIAGNOSIS — Z79899 Other long term (current) drug therapy: Secondary | ICD-10-CM

## 2017-05-20 LAB — COMPLETE METABOLIC PANEL WITH GFR
AG Ratio: 1.4 (calc) (ref 1.0–2.5)
ALT: 7 U/L (ref 6–29)
AST: 12 U/L (ref 10–35)
Albumin: 3.9 g/dL (ref 3.6–5.1)
Alkaline phosphatase (APISO): 78 U/L (ref 33–130)
BUN: 11 mg/dL (ref 7–25)
CO2: 29 mmol/L (ref 20–32)
Calcium: 8.8 mg/dL (ref 8.6–10.4)
Chloride: 104 mmol/L (ref 98–110)
Creat: 0.83 mg/dL (ref 0.50–0.99)
GFR, Est African American: 86 mL/min/{1.73_m2} (ref >=60)
GFR, Est Non African American: 74 mL/min/{1.73_m2} (ref >=60)
Globulin: 2.8 g/dL (calc) (ref 1.9–3.7)
Glucose, Bld: 59 mg/dL — ABNORMAL LOW (ref 65–139)
Potassium: 3.8 mmol/L (ref 3.5–5.3)
Sodium: 139 mmol/L (ref 135–146)
Total Bilirubin: 0.4 mg/dL (ref 0.2–1.2)
Total Protein: 6.7 g/dL (ref 6.1–8.1)

## 2017-05-20 MED ORDER — OXYCODONE HCL 15 MG PO TABS: 15.0000 mg | ORAL_TABLET | ORAL | 0 refills | Status: DC | PRN

## 2017-05-20 NOTE — Patient Instructions (Addendum)
START KEFLEX (CEPHALEXIN) 2 TIMES DAILY X 10 DAYS for sinus infection  START PROBIOTIC (CULTURELLE OR FLORASTER) DAILY WHILE ON ANTIBIOTIC  Continue other medications as ordered  Follow up with pulmonary Dr Sherene Sires for lung management  Will call with lab results  RECOMMEND FLU SHOT AT LOCAL PHARMACY ONCE YOUR ANTIBIOTIC IS DONE  Follow up in 3 mos for HTN, MDD, chronic pain, obesity

## 2017-05-20 NOTE — Progress Notes (Signed)
Patient ID: Melinda [name], female   DOB: [birth year], 66 y.o.   MRN: [id number]    Location:  [facility] Place of Service: OFFICE  Chief Complaint  Patient presents with  . Follow-up    Patient requested appointment to thorughly review medications, patient also c/o migraines and extreme leg weakness   . Fall Risk    Positive fall risk   . Immunizations    Discuss need for flu vaccine patient with URI symptoms today   . Quality Metric Gaps    Mammogram and colonoscopy order pending     HPI:  66 yo female seen today for f/u. She has multiple c/o today, mostly related to pain and generalized weakness. She reports recurrent falls due to leg weakness.   She has 2 day hx sore throat with mostly productive cough. (+) post nasal drip with sinus pain and pressure x 2 weeks. She takes daily loratidine and flonase. She has a hx asthma  FMS/chronic pain/right sciatica/neuropathy - pain uncontrolled on percocet, Robaxin, celebrex, and neurontin. She c/o generalized pain today.  She was followed by neurology in the past. She has intermittent tingling and takes neurontin and doxepin. Her walker brakes do not work as well.  Depression - mood stable on citalopram. She is estranged from her family. Denies SI/HI. She has feelings of worthlessness. Currently takes amitriptyline. She gets parietal HA  HTN - BP controlled on metoprolol  Prediabetes - diet controlled. She avoids complex CHO and maintains healthy diet. A1c 5.6%  Obesity - weight down 16 lbs since last OV. Gait is unsteady and she has poor exercise tolerance  Past Medical History:  Diagnosis Date  . Arthritis    "99% of my body" (11/11/2016)  . Asthma   . Bursitis of both hips   . Cervical scoliosis   . Chronic back pain    "all over my back" (11/11/2016)  . Depressive disorder, not elsewhere classified   . Enthesopathy of hip region   . Environmental allergies    "I take Claritin qd; 365 days/year" (11/11/2016)  . Essential hypertension,  benign   . Fibromyalgia   . Frequent falls   . Headache    "at least 1/wk; may last for 2 days or so" (11/11/2016)  . History of blood transfusion 1985   w/hysterectomy  . Lumbar stenosis   . Migraine    "a few/month" (11/11/2016)  . Muscle weakness (generalized)   . Myalgia and myositis, unspecified   . Other abnormal blood chemistry   . Other malaise and fatigue   . Raynaud's syndrome   . Sciatic nerve pain, left   . Sciatica   . Unspecified vitamin D deficiency     Past Surgical History:  Procedure Laterality Date  . ABDOMINAL HYSTERECTOMY  1985  . CESAREAN SECTION  1976; 1979  . SHOULDER OPEN ROTATOR CUFF REPAIR Left     Patient Care Team: Kirt Boys, DO as PCP - General (Internal Medicine)  Social History   Socioeconomic History  . Marital status: Divorced    Spouse name: Not on file  . Number of children: 3  . Years of education: 38  . Highest education level: Not on file  Social Needs  . Financial resource strain: Not on file  . Food insecurity - worry: Not on file  . Food insecurity - inability: Not on file  . Transportation needs - medical: Not on file  . Transportation needs - non-medical: Not on file  Occupational History    Comment:  retired  Tobacco Use  . Smoking status: Never Smoker  . Smokeless tobacco: Never Used  Substance and Sexual Activity  . Alcohol use: No    Alcohol/week: 0.0 oz  . Drug use: No  . Sexual activity: Yes  Other Topics Concern  . Not on file  Social History Narrative   Patient lives with her grandson. Patient is retired.   Education some college.   Right handed.    Caffeine two cups daily.     reports that  has never smoked. she has never used smokeless tobacco. She reports that she does not drink alcohol or use drugs.  Family History  Problem Relation Age of Onset  . Dementia Mother   . Heart disease Mother   . Hypertension Mother   . Diabetes Mother   . Diabetes Sister   . Cancer Sister        lung  .  Cancer Other        Nepher (Mothers side)  . Diabetes Maternal Grandmother   . Arthritis Maternal Grandmother   . Diabetes Cousin        Mother's side  . Arthritis Sister   . Arthritis Cousin        Mother's side    Family Status  Relation Name Status  . Mother  Deceased at age 87  . Father  Deceased  . Sister Myriam Jacobson Deceased  . Brother Thurmen Deceased  . Daughter Frederich Cha  . Son [address]  . Sister [employer]  . Son [employer]  . Other  (Not Specified)  . [employer]  (Not Specified)  . Cousin  (Not Specified)  . Sister  (Not Specified)  . Cousin  (Not Specified)     No Known Allergies  Medications:   Medication List        Accurate as of 05/20/17  3:57 PM. Always use your most recent med list.          amitriptyline 25 MG tablet Commonly known as:  ELAVIL TAKE TWO TABLETS AT BEDTIME FOR REST   aspirin 81 MG tablet   celecoxib 100 MG capsule Commonly known as:  CELEBREX TAKE ONE CAPSULE TWICE DAILY FOR PAINS   citalopram 20 MG tablet Commonly known as:  CELEXA Take 1 tablet (20 mg total) by mouth daily.   doxepin 50 MG capsule Commonly known as:  SINEQUAN Take 2 capsules (100 mg total) at bedtime by mouth.   fluticasone 50 MCG/ACT nasal spray Commonly known as:  FLONASE Place 2 sprays into both nostrils daily as needed for allergies or rhinitis.   gabapentin 400 MG capsule Commonly known as:  NEURONTIN Take 3 caps po BID, 2 caps po qHS for neuropathy   loratadine 10 MG tablet Commonly known as:  EQ LORATADINE Take 1 tablet (10 mg total) by mouth daily.   methocarbamol 500 MG tablet Commonly known as:  ROBAXIN TAKE ONE TABLET EVERY 8 HOURS AS NEEDED FOR MUSCLE SPASMS   metoprolol tartrate 50 MG tablet Commonly known as:  LOPRESSOR TAKE ONE TABLET TWICE DAILY FOR BLOOD PRESSURE   oxyCODONE 15 MG immediate release tablet Commonly known as:  ROXICODONE Take 1 tablet (15 mg total) by mouth every 4 (four) hours as needed for pain.     VENTOLIN HFA 108 (90 Base) MCG/ACT inhaler Generic drug:  albuterol INHALE 2 PUFFS  EVERY 6 (SIX) HOURS AS NEEDED FOR WHEEZING OR SHORTNESS OF BREATH.   vitamin D (CHOLECALCIFEROL) 400 units tablet  Where to Get Your Medications    You can get these medications from any pharmacy   Bring a paper prescription for each of these medications  oxyCODONE 15 MG immediate release tablet     Review of Systems  HENT: Positive for sinus pressure, sinus pain and sore throat.   Respiratory: Positive for cough.   Neurological: Positive for weakness.  Psychiatric/Behavioral: Positive for dysphoric mood.  All other systems reviewed and are negative.   Vitals:   05/20/17 0756  BP: 126/68  Pulse: 70  Temp: 97.7 F (36.5 C)  TempSrc: Oral  SpO2: 98%  Weight: 243 lb (110.2 kg)  Height: 5\' 4"  (1.626 m)   Body mass index is 41.71 kg/m.  Physical Exam  Constitutional: She is oriented to person, place, and time. She appears well-developed and well-nourished.  HENT:  Mouth/Throat: No oropharyngeal exudate.  MMM; no oral thrush; oropharynx cobblestoning and red but no exudate; L>R maxillary sinus TTP with boggy tissue texture changes  Eyes: Pupils are equal, round, and reactive to light. No scleral icterus.  Neck: Neck supple. Carotid bruit is not present. No tracheal deviation present.  Cardiovascular: Normal rate, regular rhythm and intact distal pulses. Exam reveals no gallop and no friction rub.  Murmur (1/6 sem) heard. No LE edema b/l. no calf TTP.   Pulmonary/Chest: Effort normal and breath sounds normal. No stridor. No respiratory distress. She has no wheezes. She has no rales.  Abdominal: Soft. Normal appearance and bowel sounds are normal. She exhibits no distension and no mass. There is no hepatomegaly. There is no tenderness. There is no rigidity, no rebound and no guarding. No hernia.  obese  Musculoskeletal: She exhibits edema and tenderness.  Lymphadenopathy:    She  has cervical adenopathy (left TTP).  Neurological: She is alert and oriented to person, place, and time. Gait (antalgic) abnormal.  Skin: Skin is warm and dry. No rash noted.  Psychiatric: She has a normal mood and affect. Her behavior is normal. Judgment and thought content normal.     Labs reviewed: Clinical Support on 04/13/2017  Component Date Value Ref Range Status  . FVC-Pre 04/13/2017 2.10  L Final  . FVC-%Pred-Pre 04/13/2017 80  % Final  . FVC-Post 04/13/2017 2.12  L Final  . FVC-%Pred-Post 04/13/2017 81  % Final  . FVC-%Change-Post 04/13/2017 0  % Final  . FEV1-Pre 04/13/2017 1.81  L Final  . FEV1-%Pred-Pre 04/13/2017 88  % Final  . FEV1-Post 04/13/2017 1.76  L Final  . FEV1-%Pred-Post 04/13/2017 86  % Final  . FEV1-%Change-Post 04/13/2017 -2  % Final  . FEV6-Pre 04/13/2017 2.05  L Final  . FEV6-%Pred-Pre 04/13/2017 81  % Final  . FEV6-Post 04/13/2017 2.12  L Final  . FEV6-%Pred-Post 04/13/2017 83  % Final  . FEV6-%Change-Post 04/13/2017 3  % Final  . Pre FEV1/FVC ratio 04/13/2017 86  % Final  . FEV1FVC-%Pred-Pre 04/13/2017 110  % Final  . Post FEV1/FVC ratio 04/13/2017 83  % Final  . FEV1FVC-%Change-Post 04/13/2017 -3  % Final  . Pre FEV6/FVC Ratio 04/13/2017 98  % Final  . FEV6FVC-%Pred-Pre 04/13/2017 100  % Final  . Post FEV6/FVC ratio 04/13/2017 100  % Final  . FEV6FVC-%Pred-Post 04/13/2017 103  % Final  . FEV6FVC-%Change-Post 04/13/2017 2  % Final  . FEF 25-75 Pre 04/13/2017 2.22  L/sec Final  . FEF2575-%Pred-Pre 04/13/2017 114  % Final  . FEF 25-75 Post 04/13/2017 1.95  L/sec Final  . FEF2575-%Pred-Post 04/13/2017 100  % Final  .  FEF2575-%Change-Post 04/13/2017 -12  % Final  . RV 04/13/2017 1.69  L Final  . RV % pred 04/13/2017 78  % Final  . TLC 04/13/2017 4.09  L Final  . TLC % pred 04/13/2017 78  % Final  . DLCO unc 04/13/2017 14.60  ml/min/mmHg Final  . DLCO unc % pred 04/13/2017 57  % Final  . DLCO cor 04/13/2017 15.53  ml/min/mmHg Final  . DLCO cor %  pred 04/13/2017 60  % Final  . DL/VA 40/98/1191 4.78  ml/min/mmHg/L Final  . DL/VA % pred 29/56/2130 82  % Final  Lab on 03/12/2017  Component Date Value Ref Range Status  . Anit Nuclear Antibody(ANA) 03/12/2017 NEGATIVE  NEGATIVE Final   Comment: ANA IFA is a first line screen for detecting the presence of up to approximately 150 autoantibodies in various autoimmune diseases. A negative ANA IFA result suggests ANA-associated autoimmune diseases are not present at this time. . Visit Physician FAQs for interpretation of all antibodies in the [place], prevalence, and association with diseases at http://education.[facility].com/ [email or web address] .   Marland Kitchen Cyclic Citrullin Peptide Ab 29/52/8413 17  UNITS Final   Comment: Reference Range Negative:            <20 Weak Positive:       20-39 Moderate Positive:   40-59 Strong Positive:     >59 .   Marland Kitchen Rhuematoid fact SerPl-aCnc 03/12/2017 <14  <14 IU/mL Final  . Sed Rate 03/12/2017 20  0 - 30 mm/hr Final  . Scleroderma (Scl-70) (ENA) Antibod* 03/12/2017 <1.0 NEG  <1.0 NEG AI Final    No results found.   Assessment/Plan   ICD-10-CM   1. Acute maxillary sinusitis, recurrence not specified J01.00   2. Seasonal allergic rhinitis, unspecified trigger J30.2   3. Chronic pain syndrome G89.4 oxyCODONE (ROXICODONE) 15 MG immediate release tablet  4. Idiopathic peripheral neuropathy G60.9 oxyCODONE (ROXICODONE) 15 MG immediate release tablet  5. Spinal stenosis, unspecified spinal region M48.00 oxyCODONE (ROXICODONE) 15 MG immediate release tablet  6. Essential hypertension I10   7. Severe episode of recurrent major depressive disorder, without psychotic features (HCC) F33.2   8. Cough variant asthma J45.991   9. Pulmonary hypertension (HCC) I27.20   10. High risk medication use Z79.899 CMP with eGFR  11. Colon cancer screening Z12.11 Ambulatory referral to Gastroenterology  12. Breast cancer screening Z12.31 MM DIGITAL SCREENING BILATERAL    13. Fibromyalgia M79.7 oxyCODONE (ROXICODONE) 15 MG immediate release tablet  14. Sciatica, unspecified laterality M54.30 oxyCODONE (ROXICODONE) 15 MG immediate release tablet   START KEFLEX (CEPHALEXIN) 2 TIMES DAILY X 10 DAYS for sinus infection  START PROBIOTIC (CULTURELLE OR FLORASTER) DAILY WHILE ON ANTIBIOTIC  Continue other medications as ordered  Follow up with pulmonary Dr Sherene Sires for lung management  Will call with lab results  RECOMMEND FLU SHOT AT LOCAL PHARMACY ONCE YOUR ANTIBIOTIC IS DONE  Follow up in 3 mos for HTN, MDD, chronic pain, obesity    Jayra Choyce S. Ancil Linsey  [facility] [address] [place], [place] [address] [phone] Cell (Monday-Friday 8 AM - 5 PM) [phone] After 5 PM and follow prompts

## 2017-05-25 ENCOUNTER — Telehealth: Payer: Self-pay | Admitting: *Deleted

## 2017-05-25 MED ORDER — CEPHALEXIN 250 MG PO CAPS: 250.0000 mg | ORAL_CAPSULE | Freq: Two times a day (BID) | ORAL | 0 refills | Status: DC

## 2017-05-25 NOTE — Telephone Encounter (Signed)
Patient called and spoke with Aram Beecham and stated that her antibiotic was not called in. Was seen in office on 05/20/17. Reviewed OV note.  Faxed Rx to pharmacy. Aram Beecham notified patient.

## 2017-06-05 ENCOUNTER — Other Ambulatory Visit: Payer: Self-pay | Admitting: *Deleted

## 2017-06-05 DIAGNOSIS — J302 Other seasonal allergic rhinitis: Secondary | ICD-10-CM

## 2017-06-05 MED ORDER — FLUTICASONE PROPIONATE 50 MCG/ACT NA SUSP: 2.0000 | Freq: Every day | NASAL | 6 refills | Status: DC | PRN

## 2017-06-10 ENCOUNTER — Telehealth: Payer: Self-pay

## 2017-06-10 NOTE — Telephone Encounter (Signed)
Called patient to try to schedule AWV in the office. No answer-left voicemail to call back.    

## 2017-06-18 ENCOUNTER — Other Ambulatory Visit: Payer: Self-pay | Admitting: *Deleted

## 2017-06-18 DIAGNOSIS — G894 Chronic pain syndrome: Secondary | ICD-10-CM

## 2017-06-18 DIAGNOSIS — M5430 Sciatica, unspecified side: Secondary | ICD-10-CM

## 2017-06-18 DIAGNOSIS — M797 Fibromyalgia: Secondary | ICD-10-CM

## 2017-06-18 DIAGNOSIS — G609 Hereditary and idiopathic neuropathy, unspecified: Secondary | ICD-10-CM

## 2017-06-18 DIAGNOSIS — M4800 Spinal stenosis, site unspecified: Secondary | ICD-10-CM

## 2017-06-18 MED ORDER — OXYCODONE HCL 15 MG PO TABS: 15.0000 mg | ORAL_TABLET | ORAL | 0 refills | Status: DC | PRN

## 2017-06-18 NOTE — Telephone Encounter (Signed)
Patient notified

## 2017-06-18 NOTE — Telephone Encounter (Signed)
Patient requested NCCSRS Database Verified Rx pended and sent for Dr. Montez Morita for approval. Pharmacy Verified.

## 2017-06-19 ENCOUNTER — Telehealth: Payer: Self-pay | Admitting: *Deleted

## 2017-06-19 DIAGNOSIS — M5430 Sciatica, unspecified side: Secondary | ICD-10-CM

## 2017-06-19 DIAGNOSIS — M4800 Spinal stenosis, site unspecified: Secondary | ICD-10-CM

## 2017-06-19 DIAGNOSIS — M797 Fibromyalgia: Secondary | ICD-10-CM

## 2017-06-19 DIAGNOSIS — G609 Hereditary and idiopathic neuropathy, unspecified: Secondary | ICD-10-CM

## 2017-06-19 DIAGNOSIS — G894 Chronic pain syndrome: Secondary | ICD-10-CM

## 2017-06-19 MED ORDER — OXYCODONE HCL 15 MG PO TABS: 15.0000 mg | ORAL_TABLET | ORAL | 0 refills | Status: DC | PRN

## 2017-06-19 NOTE — Telephone Encounter (Signed)
Patient called and stated that we did not send to correct pharmacy. Confirmed pharmacy with patient and it is correct pharmacy. We do have receipt of confirmation that it was sent to pharmacy. Patient aware  I called the Pharmacy [phone] and spoke with pharmacist. She stated that they did receive Rx but they do not have it in stock and stated that we should have called before sending the Rx to check if they had in stock. She was very short with me.   Tried calling patient but just rings then goes into a busy signal.

## 2017-06-19 NOTE — Telephone Encounter (Signed)
Patient called and LM on Clinical Intake Line stating that she is out of town till 12/29. Stated that her grand daughter was to pick up her Narcotic Rx and send it to her but her grand daughter was put in the hospital and unable to do it. Patient is wanting to know if we can send her Rx to the pharmacy up there just this one time.   I tried calling patient back and left message on her voice mail to call back with the pharmacy information she wants the Rx sent to out of town and informed her that then I will send Dr. Montez Morita a message to get approval. Awaiting call back.

## 2017-06-19 NOTE — Telephone Encounter (Signed)
Patient called with pharmacy information:  [facility] [id number] [facility] [place]  Phone [phone]   Is it ok for a one time fill to this [place] refill? Please advise  [place] Database verified. Last fill date was 05/24/17 for #[id number].

## 2017-06-19 NOTE — Telephone Encounter (Signed)
done

## 2017-06-24 ENCOUNTER — Encounter: Payer: Self-pay | Admitting: Internal Medicine

## 2017-06-26 MED ORDER — OXYCODONE HCL 15 MG PO TABS: 15.0000 mg | ORAL_TABLET | ORAL | 0 refills | Status: DC | PRN

## 2017-06-26 NOTE — Addendum Note (Signed)
Addended by: Kirt Boys on: 06/26/2017 02:31 PM   Modules accepted: Orders

## 2017-06-26 NOTE — Telephone Encounter (Signed)
Patient notified and agreed.  

## 2017-06-26 NOTE — Telephone Encounter (Signed)
done

## 2017-06-26 NOTE — Addendum Note (Signed)
Addended by: Nelda Severe A on: 06/26/2017 12:55 PM   Modules accepted: Orders

## 2017-06-26 NOTE — Telephone Encounter (Addendum)
Patient called and stated that she was not able to get her Rx through the Pharmacy [facility] [facility] because they do not have in stock. Patient has not had any pain medication in a week and in pain.  Patient stated that she spoke with another pharmacy and wants to know if you would fax in the Rx to there[facility]   If not she understands and will get her grand daughter to pick up the original from pharmacy here and ship it to her. Stated granddaughter is now out of hospital.   Verified NCCSRS Database and LR 05/24/17. Please Advise.

## 2017-06-29 ENCOUNTER — Telehealth: Payer: Self-pay

## 2017-06-29 ENCOUNTER — Ambulatory Visit: Payer: Self-pay

## 2017-06-29 NOTE — Telephone Encounter (Signed)
Called patient due to missed appointment today and to see if she wanted to reschedule her annual wellness visit. Pt did not answer, I left a VM

## 2017-07-09 ENCOUNTER — Telehealth: Payer: Self-pay | Admitting: *Deleted

## 2017-07-09 DIAGNOSIS — M5430 Sciatica, unspecified side: Secondary | ICD-10-CM

## 2017-07-09 DIAGNOSIS — G609 Hereditary and idiopathic neuropathy, unspecified: Secondary | ICD-10-CM

## 2017-07-09 DIAGNOSIS — G894 Chronic pain syndrome: Secondary | ICD-10-CM

## 2017-07-09 DIAGNOSIS — M4800 Spinal stenosis, site unspecified: Secondary | ICD-10-CM

## 2017-07-09 DIAGNOSIS — M797 Fibromyalgia: Secondary | ICD-10-CM

## 2017-07-09 MED ORDER — OXYCODONE HCL 15 MG PO TABS: 15.0000 mg | ORAL_TABLET | ORAL | 0 refills | Status: DC | PRN

## 2017-07-09 NOTE — Telephone Encounter (Signed)
Patient notified

## 2017-07-09 NOTE — Telephone Encounter (Signed)
Done

## 2017-07-09 NOTE — Telephone Encounter (Signed)
Patient called and stated that she was not able to get her pain medication while she was out of town, the pharmacy would not give it to her. Would like a Rx faxed to [facility]. She has just arrived back into town today.   Checked [place] and last refill was 05/24/17. Pended Rx and sent to Dr. Montez Morita for approval.

## 2017-07-21 ENCOUNTER — Ambulatory Visit: Payer: Medicare HMO | Admitting: Internal Medicine

## 2017-07-28 ENCOUNTER — Telehealth: Payer: Self-pay | Admitting: *Deleted

## 2017-07-28 DIAGNOSIS — G609 Hereditary and idiopathic neuropathy, unspecified: Secondary | ICD-10-CM

## 2017-07-28 DIAGNOSIS — G894 Chronic pain syndrome: Secondary | ICD-10-CM

## 2017-07-28 DIAGNOSIS — M797 Fibromyalgia: Secondary | ICD-10-CM

## 2017-07-28 DIAGNOSIS — M5430 Sciatica, unspecified side: Secondary | ICD-10-CM

## 2017-07-28 DIAGNOSIS — M4800 Spinal stenosis, site unspecified: Secondary | ICD-10-CM

## 2017-07-28 MED ORDER — OXYCODONE HCL 15 MG PO TABS: 15.0000 mg | ORAL_TABLET | ORAL | 0 refills | Status: DC | PRN

## 2017-07-28 NOTE — Telephone Encounter (Signed)
done

## 2017-07-28 NOTE — Telephone Encounter (Signed)
Patient notified and agreed.  

## 2017-07-28 NOTE — Telephone Encounter (Signed)
Patient called requesting refill on her Oxycodone. We wrote a Rx for it on 07/09/17. I called pharmacy and spoke with pharmacist and she stated that patient had #28 filled on 07/13/17. Stated that patient is due because they only gave her a 7 day supply due to patient being private pay.   [place] Database Verified Pharmacy Confirmed Pended Rx and sent to Dr. Montez Morita for approval.

## 2017-08-03 ENCOUNTER — Other Ambulatory Visit: Payer: Self-pay | Admitting: Internal Medicine

## 2017-08-03 DIAGNOSIS — G609 Hereditary and idiopathic neuropathy, unspecified: Secondary | ICD-10-CM

## 2017-08-14 ENCOUNTER — Ambulatory Visit (INDEPENDENT_AMBULATORY_CARE_PROVIDER_SITE_OTHER): Payer: Medicare HMO

## 2017-08-14 ENCOUNTER — Encounter: Payer: Self-pay | Admitting: Gastroenterology

## 2017-08-14 ENCOUNTER — Ambulatory Visit (INDEPENDENT_AMBULATORY_CARE_PROVIDER_SITE_OTHER): Payer: Medicare HMO | Admitting: Internal Medicine

## 2017-08-14 ENCOUNTER — Encounter: Payer: Self-pay | Admitting: Internal Medicine

## 2017-08-14 VITALS — BP 130/80 | HR 71 | Temp 98.0°F | Ht 64.0 in | Wt 245.0 lb

## 2017-08-14 DIAGNOSIS — J302 Other seasonal allergic rhinitis: Secondary | ICD-10-CM

## 2017-08-14 DIAGNOSIS — R296 Repeated falls: Secondary | ICD-10-CM | POA: Diagnosis not present

## 2017-08-14 DIAGNOSIS — E2839 Other primary ovarian failure: Secondary | ICD-10-CM | POA: Diagnosis not present

## 2017-08-14 DIAGNOSIS — M797 Fibromyalgia: Secondary | ICD-10-CM | POA: Diagnosis not present

## 2017-08-14 DIAGNOSIS — R2681 Unsteadiness on feet: Secondary | ICD-10-CM

## 2017-08-14 DIAGNOSIS — G894 Chronic pain syndrome: Secondary | ICD-10-CM

## 2017-08-14 DIAGNOSIS — Z043 Encounter for examination and observation following other accident: Secondary | ICD-10-CM

## 2017-08-14 DIAGNOSIS — Z135 Encounter for screening for eye and ear disorders: Secondary | ICD-10-CM | POA: Diagnosis not present

## 2017-08-14 DIAGNOSIS — Z0000 Encounter for general adult medical examination without abnormal findings: Secondary | ICD-10-CM | POA: Diagnosis not present

## 2017-08-14 DIAGNOSIS — K219 Gastro-esophageal reflux disease without esophagitis: Secondary | ICD-10-CM | POA: Diagnosis not present

## 2017-08-14 DIAGNOSIS — M4800 Spinal stenosis, site unspecified: Secondary | ICD-10-CM

## 2017-08-14 DIAGNOSIS — Z1211 Encounter for screening for malignant neoplasm of colon: Secondary | ICD-10-CM

## 2017-08-14 DIAGNOSIS — Z041 Encounter for examination and observation following transport accident: Secondary | ICD-10-CM

## 2017-08-14 MED ORDER — METHYLPREDNISOLONE ACETATE 40 MG/ML IJ SUSP
40.0000 mg | Freq: Once | INTRAMUSCULAR | Status: AC
  Administered 2017-08-14: 40 mg via INTRAMUSCULAR

## 2017-08-14 MED ORDER — METHYLPREDNISOLONE 4 MG PO TBPK: ORAL_TABLET | ORAL | 0 refills | Status: DC

## 2017-08-14 MED ORDER — RANITIDINE HCL 150 MG PO TABS: 150.0000 mg | ORAL_TABLET | Freq: Every day | ORAL | 3 refills | Status: DC

## 2017-08-14 NOTE — Addendum Note (Signed)
Addended by: Sueanne Margarita on: 08/14/2017 04:20 PM   Modules accepted: Orders

## 2017-08-14 NOTE — Progress Notes (Signed)
Subjective:   Melinda [name] is a 67 y.o. female who presents for Medicare Annual (Subsequent) preventive examination.  Last AWV-05/28/2016       Objective:     Vitals: BP 130/80 (BP Location: Right Arm, Patient Position: Sitting)   Pulse 71   Temp 98 F (36.7 C) (Oral)   Ht 5\' 4"  (1.626 m)   Wt 245 lb (111.1 kg)   SpO2 98%   BMI 42.05 kg/m   Body mass index is 42.05 kg/m.  Advanced Directives 08/14/2017 05/20/2017 11/11/2016 05/28/2016 03/27/2016 01/25/2016 10/26/2015  Does Patient Have a Medical Advance Directive? Yes Yes No No No No No  Type of 10/28/2015 of [place];Living will Healthcare Power of [place];Living will - - - - -  Does patient want to make changes to medical advance directive? No - Patient declined - - - - - -  Copy of Healthcare Power of Attorney in Chart? No - copy requested No - copy requested - - - - -  Would patient like information on creating a medical advance directive? - - Yes (Inpatient - patient defers creating a medical advance directive at this time) - No - patient declined information Yes - Educational materials given No - patient declined information    Tobacco Social History   Tobacco Use  Smoking Status Never Smoker  Smokeless Tobacco Never Used     Counseling given: Not Answered   Clinical Intake:  Pre-visit preparation completed: No        Diabetes: No  How often do you need to have someone help you when you read instructions, pamphlets, or other written materials from your doctor or pharmacy?: 1 - Never What is the last grade level you completed in school?: Some College  Interpreter Needed?: No  Information entered by :: [email or web address], RN  Past Medical History:  Diagnosis Date  . Arthritis    "99% of my body" (11/11/2016)  . Asthma   . Bursitis of both hips   . Cervical scoliosis   . Chronic back pain    "all over my back" (11/11/2016)  . Depressive disorder, not elsewhere classified   .  Enthesopathy of hip region   . Environmental allergies    "I take Claritin qd; 365 days/year" (11/11/2016)  . Essential hypertension, benign   . Fibromyalgia   . Frequent falls   . Headache    "at least 1/wk; may last for 2 days or so" (11/11/2016)  . History of blood transfusion 1985   w/hysterectomy  . Lumbar stenosis   . Migraine    "a few/month" (11/11/2016)  . Muscle weakness (generalized)   . Myalgia and myositis, unspecified   . Other abnormal blood chemistry   . Other malaise and fatigue   . Raynaud's syndrome   . Sciatic nerve pain, left   . Sciatica   . Unspecified vitamin D deficiency    Past Surgical History:  Procedure Laterality Date  . ABDOMINAL HYSTERECTOMY  1985  . CESAREAN SECTION  1976; 1979  . SHOULDER OPEN ROTATOR CUFF REPAIR Left    Family History  Problem Relation Age of Onset  . Dementia Mother   . Heart disease Mother   . Hypertension Mother   . Diabetes Mother   . Diabetes Sister   . Cancer Sister        lung  . Cancer Other        Nepher (Mothers side)  . Diabetes Maternal Grandmother   . Arthritis Maternal  Grandmother   . Diabetes Cousin        Mother's side  . Arthritis Sister   . Arthritis Cousin        Mother's side    Social History   Socioeconomic History  . Marital status: Divorced    Spouse name: None  . Number of children: 3  . Years of education: 41  . Highest education level: None  Social Needs  . Financial resource strain: Not hard at all  . Food insecurity - worry: Never true  . Food insecurity - inability: Never true  . Transportation needs - medical: No  . Transportation needs - non-medical: No  Occupational History    Comment: retired  Tobacco Use  . Smoking status: Never Smoker  . Smokeless tobacco: Never Used  Substance and Sexual Activity  . Alcohol use: No    Alcohol/week: 0.0 oz  . Drug use: No  . Sexual activity: Yes  Other Topics Concern  . None  Social History Narrative   Patient lives with her  grandson. Patient is retired.   Education some college.   Right handed.    Caffeine two cups daily.    Outpatient Encounter Medications as of 08/14/2017  Medication Sig  . amitriptyline (ELAVIL) 25 MG tablet TAKE TWO TABLETS AT BEDTIME FOR REST  . aspirin 81 MG tablet Take 81 mg by mouth daily.  . celecoxib (CELEBREX) 100 MG capsule TAKE 1 CAPSULE TWICE DAILY FOR PAINS  . citalopram (CELEXA) 20 MG tablet Take 1 tablet (20 mg total) by mouth daily.  Marland Kitchen doxepin (SINEQUAN) 50 MG capsule Take 2 capsules (100 mg total) at bedtime by mouth.  . fluticasone (FLONASE) 50 MCG/ACT nasal spray Place 2 sprays into both nostrils daily as needed for allergies or rhinitis.  Marland Kitchen gabapentin (NEURONTIN) 400 MG capsule TAKE 3 CAPSULES TWICE DAILY  AND TAKE 2 CAPSULES AT BEDTIME  FOR  NEUROPATHY  . loratadine (EQ LORATADINE) 10 MG tablet Take 1 tablet (10 mg total) by mouth daily.  . methocarbamol (ROBAXIN) 500 MG tablet TAKE ONE TABLET EVERY 8 HOURS AS NEEDED FOR MUSCLE SPASMS  . metoprolol tartrate (LOPRESSOR) 50 MG tablet TAKE ONE TABLET TWICE DAILY FOR BLOOD PRESSURE  . oxyCODONE (ROXICODONE) 15 MG immediate release tablet Take 1 tablet (15 mg total) by mouth every 4 (four) hours as needed for pain.  . VENTOLIN HFA 108 (90 Base) MCG/ACT inhaler INHALE 2 PUFFS  EVERY 6 (SIX) HOURS AS NEEDED FOR WHEEZING OR SHORTNESS OF BREATH.  . vitamin D, CHOLECALCIFEROL, 400 UNITS tablet Take 1,200 Units by mouth daily.   . [DISCONTINUED] cephALEXin (KEFLEX) 250 MG capsule Take 1 capsule (250 mg total) by mouth 2 (two) times daily.   No facility-administered encounter medications on file as of 08/14/2017.     Activities of Daily Living In your present state of health, do you have any difficulty performing the following activities: 08/14/2017 11/11/2016  Hearing? N N  Vision? Y Y  Difficulty concentrating or making decisions? N N  Walking or climbing stairs? Y Y  Dressing or bathing? N N  Doing errands, shopping? N Y    Comment - "I haven't been drivingForensic scientist and eating ? N -  Using the Toilet? N -  In the past six months, have you accidently leaked urine? Y -  Do you have problems with loss of bowel control? N -  Managing your Medications? N -  Managing your Finances? N -  Housekeeping or managing  your Housekeeping? N -  Some recent data might be hidden    Patient Care Team: Kirt Boys, DO as PCP - General (Internal Medicine)    Assessment:   This is a routine wellness examination for Melinda [name].  Exercise Activities and Dietary recommendations Current Exercise Habits: The patient does not participate in regular exercise at present, Exercise limited by: None identified  Goals    . Exercise 3x per week (30 min per time)     Pt would like to go back to the gym 3 days a week    . Weight (lb) < 200 lb (90.7 kg)     Starting 05/28/16, I will attempt to lose 50 lbs, over the next year and strengthen my leg muscles, so I can stand better.        Fall Risk Fall Risk  08/14/2017 05/20/2017 01/02/2017 05/28/2016 01/25/2016  Falls in the past year? Yes Yes Yes Yes Yes  Number falls in past yr: 2 or more 2 or more 2 or more 2 or more 2 or more  Comment 16 falls over the last two weeks because of shower Total of 6 falls  - Pt states she has had 9 falls this year. -  Injury with Fall? No Yes No Yes No  Comment - - - Missed the last 4 steps and hit her head on the concrete -  Risk Factor Category  - - - High Fall Risk -  Follow up - - - Falls prevention discussed -   Is the patient's home free of loose throw rugs in walkways, pet beds, electrical cords, etc?   yes      Grab bars in the bathroom? no      Handrails on the stairs?   yes      Adequate lighting?   yes  Timed Get Up and Go performed: 28 seconds, fall risk  Depression Screen PHQ 2/9 Scores 08/14/2017 01/02/2017 05/28/2016 09/20/2015  PHQ - 2 Score 0 0 6 2  PHQ- 9 Score - - 17 4     Cognitive Function MMSE - Mini Mental State  Exam 08/14/2017 05/28/2016  Orientation to time 5 5  Orientation to Place 5 5  Registration 3 3  Attention/ Calculation 5 5  Recall 0 0  Language- name 2 objects 2 2  Language- repeat 1 1  Language- follow 3 step command 3 3  Language- read & follow direction 1 1  Write a sentence 1 1  Copy design 1 1  Total score 27 27        Immunization History  Administered Date(s) Administered  . Influenza,inj,Quad PF,6+ Mos 05/23/2015, 05/28/2016  . Influenza-Unspecified 07/07/2012, 02/28/2014  . Pneumococcal Conjugate-13 07/14/2014  . Tdap 01/26/2016    Qualifies for Shingles Vaccine? No, pt stated she never had chicken pox  Screening Tests Health Maintenance  Topic Date Due  . HIV Screening  10/19/1966  . MAMMOGRAM  10/18/2001  . COLONOSCOPY  10/18/2001  . DEXA SCAN  10/18/2016  . PNA vac Low Risk Adult (2 of 2 - PPSV23) 10/18/2016  . INFLUENZA VACCINE  01/28/2017  . PAP SMEAR  08/15/2018  . TETANUS/TDAP  01/25/2026  . Hepatitis C Screening  Completed    Cancer Screenings: Lung: Low Dose CT Chest recommended if Age 40-80 years, 30 pack-year currently smoking OR have quit w/in 15years. Patient does not qualify. Breast:  Up to date on Mammogram? No   Up to date of Bone Density/Dexa? No Colorectal: due  Additional Screenings:  Hepatitis B/HIV/Syphillis:not indicated Hepatitis C Screening: declined     Plan:    I have personally reviewed and addressed the Medicare Annual Wellness questionnaire and have noted the following in the patient's chart:  A. Medical and social history B. Use of alcohol, tobacco or illicit drugs  C. Current medications and supplements D. Functional ability and status E.  Nutritional status F.  Physical activity G. Advance directives H. List of other physicians I.  Hospitalizations, surgeries, and ER visits in previous 12 months J.  Vitals K. Screenings to include hearing, vision, cognitive, depression L. Referrals and appointments -  none  In addition, I have reviewed and discussed with patient certain preventive protocols, quality metrics, and best practice recommendations. A written personalized care plan for preventive services as well as general preventive health recommendations were provided to patient.  See attached scanned questionnaire for additional information.   Signed,   Tyron Russell, RN Nurse Health Advisor   Quick Notes   Health Maintenance: Eye, mammogram, bone density, and colonoscopy referrals sent. Flu vaccine, PNA vaccine, due and declined because pt does not feel her best     Abnormal Screen: 16 falls over last 2 weeks because of shower MMSE 27/30      Patient Concerns: Hip pain, would like to discuss possible PT     Nurse Concerns: None

## 2017-08-14 NOTE — Patient Instructions (Signed)
[name] , Thank you for taking time to come for your Medicare Wellness Visit. I appreciate your ongoing commitment to your health goals. Please review the following plan we discussed and let me know if I can assist you in the future.   Screening recommendations/referrals: Colonoscopy due, referral sent Mammogram due, referral sent Bone Density due, referral sent Recommended yearly ophthalmology/optometry visit for glaucoma screening and checkup Recommended yearly dental visit for hygiene and checkup  Vaccinations: Influenza vaccine due Pneumococcal vaccine due Tdap vaccine up to date, due 01/25/2026 Shingles vaccine due    Advanced directives: Advance directive discussed with you today. I have provided a copy for you to complete at home and have notarized. Once this is complete please bring a copy in to our office so we can scan it into your chart.  Conditions/risks identified: none  Next appointment: Tyron Russell, RN 08/20/2018 @ 10am   Preventive Care 65 Years and Older, Female Preventive care refers to lifestyle choices and visits with your health care provider that can promote health and wellness. What does preventive care include?  A yearly physical exam. This is also called an annual well check.  Dental exams once or twice a year.  Routine eye exams. Ask your health care provider how often you should have your eyes checked.  Personal lifestyle choices, including:  Daily care of your teeth and gums.  Regular physical activity.  Eating a healthy diet.  Avoiding tobacco and drug use.  Limiting alcohol use.  Practicing safe sex.  Taking low-dose aspirin every day.  Taking vitamin and mineral supplements as recommended by your health care provider. What happens during an annual well check? The services and screenings done by your health care provider during your annual well check will depend on your age, overall health, lifestyle risk factors, and family  history of disease. Counseling  Your health care provider may ask you questions about your:  Alcohol use.  Tobacco use.  Drug use.  Emotional well-being.  Home and relationship well-being.  Sexual activity.  Eating habits.  History of falls.  Memory and ability to understand (cognition).  Work and work Astronomer.  Reproductive health. Screening  You may have the following tests or measurements:  Height, weight, and BMI.  Blood pressure.  Lipid and cholesterol levels. These may be checked every 5 years, or more frequently if you are over 34 years old.  Skin check.  Lung cancer screening. You may have this screening every year starting at age 23 if you have a 30-pack-year history of smoking and currently smoke or have quit within the past 15 years.  Fecal occult blood test (FOBT) of the stool. You may have this test every year starting at age 73.  Flexible sigmoidoscopy or colonoscopy. You may have a sigmoidoscopy every 5 years or a colonoscopy every 10 years starting at age 52.  Hepatitis C blood test.  Hepatitis B blood test.  Sexually transmitted disease (STD) testing.  Diabetes screening. This is done by checking your blood sugar (glucose) after you have not eaten for a while (fasting). You may have this done every 1-3 years.  Bone density scan. This is done to screen for osteoporosis. You may have this done starting at age 8.  Mammogram. This may be done every 1-2 years. Talk to your health care provider about how often you should have regular mammograms. Talk with your health care provider about your test results, treatment options, and if necessary, the need for more tests. Vaccines  Your health care provider may recommend certain vaccines, such as:  Influenza vaccine. This is recommended every year.  Tetanus, diphtheria, and acellular pertussis (Tdap, Td) vaccine. You may need a Td booster every 10 years.  Zoster vaccine. You may need this after  age 66.  Pneumococcal 13-valent conjugate (PCV13) vaccine. One dose is recommended after age 14.  Pneumococcal polysaccharide (PPSV23) vaccine. One dose is recommended after age 74. Talk to your health care provider about which screenings and vaccines you need and how often you need them. This information is not intended to replace advice given to you by your health care provider. Make sure you discuss any questions you have with your health care provider. Document Released: 07/13/2015 Document Revised: 03/05/2016 Document Reviewed: 04/17/2015 Elsevier Interactive Patient Education  2017 [place] Prevention in the Home Falls can cause injuries. They can happen to people of all ages. There are many things you can do to make your home safe and to help prevent falls. What can I do on the outside of my home?  Regularly fix the edges of walkways and driveways and fix any cracks.  Remove anything that might make you trip as you walk through a door, such as a raised step or threshold.  Trim any bushes or trees on the path to your home.  Use bright outdoor lighting.  Clear any walking paths of anything that might make someone trip, such as rocks or tools.  Regularly check to see if handrails are loose or broken. Make sure that both sides of any steps have handrails.  Any raised decks and porches should have guardrails on the edges.  Have any leaves, snow, or ice cleared regularly.  Use sand or salt on walking paths during winter.  Clean up any spills in your garage right away. This includes oil or grease spills. What can I do in the bathroom?  Use night lights.  Install grab bars by the toilet and in the tub and shower. Do not use towel bars as grab bars.  Use non-skid mats or decals in the tub or shower.  If you need to sit down in the shower, use a plastic, non-slip stool.  Keep the floor dry. Clean up any water that spills on the floor as soon as it  happens.  Remove soap buildup in the tub or shower regularly.  Attach bath mats securely with double-sided non-slip rug tape.  Do not have throw rugs and other things on the floor that can make you trip. What can I do in the bedroom?  Use night lights.  Make sure that you have a light by your bed that is easy to reach.  Do not use any sheets or blankets that are too big for your bed. They should not hang down onto the floor.  Have a firm chair that has side arms. You can use this for support while you get dressed.  Do not have throw rugs and other things on the floor that can make you trip. What can I do in the kitchen?  Clean up any spills right away.  Avoid walking on wet floors.  Keep items that you use a lot in easy-to-reach places.  If you need to reach something above you, use a strong step stool that has a grab bar.  Keep electrical cords out of the way.  Do not use floor polish or wax that makes floors slippery. If you must use wax, use non-skid floor wax.  Do  not have throw rugs and other things on the floor that can make you trip. What can I do with my stairs?  Do not leave any items on the stairs.  Make sure that there are handrails on both sides of the stairs and use them. Fix handrails that are broken or loose. Make sure that handrails are as long as the stairways.  Check any carpeting to make sure that it is firmly attached to the stairs. Fix any carpet that is loose or worn.  Avoid having throw rugs at the top or bottom of the stairs. If you do have throw rugs, attach them to the floor with carpet tape.  Make sure that you have a light switch at the top of the stairs and the bottom of the stairs. If you do not have them, ask someone to add them for you. What else can I do to help prevent falls?  Wear shoes that:  Do not have high heels.  Have rubber bottoms.  Are comfortable and fit you well.  Are closed at the toe. Do not wear sandals.  If you  use a stepladder:  Make sure that it is fully opened. Do not climb a closed stepladder.  Make sure that both sides of the stepladder are locked into place.  Ask someone to hold it for you, if possible.  Clearly mark and make sure that you can see:  Any grab bars or handrails.  First and last steps.  Where the edge of each step is.  Use tools that help you move around (mobility aids) if they are needed. These include:  Canes.  Walkers.  Scooters.  Crutches.  Turn on the lights when you go into a dark area. Replace any light bulbs as soon as they burn out.  Set up your furniture so you have a clear path. Avoid moving your furniture around.  If any of your floors are uneven, fix them.  If there are any pets around you, be aware of where they are.  Review your medicines with your doctor. Some medicines can make you feel dizzy. This can increase your chance of falling. Ask your doctor what other things that you can do to help prevent falls. This information is not intended to replace advice given to you by your health care provider. Make sure you discuss any questions you have with your health care provider. Document Released: 04/12/2009 Document Revised: 11/22/2015 Document Reviewed: 07/21/2014 Elsevier Interactive Patient Education  2017 [employer].

## 2017-08-14 NOTE — Patient Instructions (Signed)
START ZANTAC 150MG  DAILY FOR ACID REFLUX  START MEDROL DOSE PAK AS DIRECTED ON 08/15/17  Depo-medrol 40mg  injection given today  Continue other medications as ordered  Follow up in 1 month for falls, HTN, prediabetes, FMS/chronic pain
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
## 2017-08-25 ENCOUNTER — Ambulatory Visit: Payer: Medicare HMO | Admitting: Internal Medicine

## 2017-08-27 ENCOUNTER — Other Ambulatory Visit: Payer: Self-pay | Admitting: *Deleted

## 2017-08-27 DIAGNOSIS — G894 Chronic pain syndrome: Secondary | ICD-10-CM

## 2017-08-27 DIAGNOSIS — M797 Fibromyalgia: Secondary | ICD-10-CM

## 2017-08-27 DIAGNOSIS — M4800 Spinal stenosis, site unspecified: Secondary | ICD-10-CM

## 2017-08-27 DIAGNOSIS — G609 Hereditary and idiopathic neuropathy, unspecified: Secondary | ICD-10-CM

## 2017-08-27 DIAGNOSIS — M5430 Sciatica, unspecified side: Secondary | ICD-10-CM

## 2017-08-27 MED ORDER — OXYCODONE HCL 15 MG PO TABS: 15.0000 mg | ORAL_TABLET | ORAL | 0 refills | Status: DC | PRN

## 2017-08-27 NOTE — Telephone Encounter (Signed)
Patient requested Refill [facility] Database Verified Pharmacy Confirmed Pended Rx and sent to Dr. Carter for approval.  

## 2017-09-03 ENCOUNTER — Ambulatory Visit
Admission: RE | Admit: 2017-09-03 | Discharge: 2017-09-03 | Disposition: A | Discharge status: Home / Self Care | Payer: Medicare HMO | Source: Ambulatory Visit | Attending: Internal Medicine | Admitting: Internal Medicine

## 2017-09-03 DIAGNOSIS — Z1231 Encounter for screening mammogram for malignant neoplasm of breast: Secondary | ICD-10-CM | POA: Diagnosis not present

## 2017-09-03 DIAGNOSIS — Z1239 Encounter for other screening for malignant neoplasm of breast: Secondary | ICD-10-CM

## 2017-09-03 DIAGNOSIS — E2839 Other primary ovarian failure: Secondary | ICD-10-CM

## 2017-09-03 DIAGNOSIS — M85852 Other specified disorders of bone density and structure, left thigh: Secondary | ICD-10-CM | POA: Diagnosis not present

## 2017-09-03 DIAGNOSIS — R6889 Other general symptoms and signs: Secondary | ICD-10-CM | POA: Diagnosis not present

## 2017-09-03 DIAGNOSIS — Z780 Asymptomatic menopausal state: Secondary | ICD-10-CM | POA: Diagnosis not present

## 2017-09-04 ENCOUNTER — Telehealth: Payer: Self-pay

## 2017-09-04 NOTE — Telephone Encounter (Signed)
Medication list has been updated to reflect recommendations based on recent bone density result.

## 2017-09-09 ENCOUNTER — Telehealth: Payer: Self-pay

## 2017-09-09 NOTE — Telephone Encounter (Signed)
Patient No Showed for Pre-Visit. A message was left on her voice mail to call and reschedule Pre-Visit today before 5:00 Pm. Patient was informed that her scheduled colonoscopy would be cancelled. If patient does not reschedule today, a no show letter will be mailed.   Janalee Dane, LPN ( [facility] )

## 2017-09-11 ENCOUNTER — Ambulatory Visit (INDEPENDENT_AMBULATORY_CARE_PROVIDER_SITE_OTHER): Payer: Medicare HMO | Admitting: Internal Medicine

## 2017-09-11 ENCOUNTER — Encounter: Payer: Self-pay | Admitting: Internal Medicine

## 2017-09-11 ENCOUNTER — Telehealth: Payer: Self-pay | Admitting: *Deleted

## 2017-09-11 VITALS — BP 120/72 | HR 77 | Temp 98.3°F | Resp 10 | Ht 64.0 in | Wt 244.0 lb

## 2017-09-11 DIAGNOSIS — I10 Essential (primary) hypertension: Secondary | ICD-10-CM

## 2017-09-11 DIAGNOSIS — G609 Hereditary and idiopathic neuropathy, unspecified: Secondary | ICD-10-CM

## 2017-09-11 DIAGNOSIS — F332 Major depressive disorder, recurrent severe without psychotic features: Secondary | ICD-10-CM

## 2017-09-11 DIAGNOSIS — R2681 Unsteadiness on feet: Secondary | ICD-10-CM | POA: Diagnosis not present

## 2017-09-11 DIAGNOSIS — M797 Fibromyalgia: Secondary | ICD-10-CM | POA: Diagnosis not present

## 2017-09-11 DIAGNOSIS — G894 Chronic pain syndrome: Secondary | ICD-10-CM

## 2017-09-11 DIAGNOSIS — R7303 Prediabetes: Secondary | ICD-10-CM | POA: Diagnosis not present

## 2017-09-11 DIAGNOSIS — Z79899 Other long term (current) drug therapy: Secondary | ICD-10-CM

## 2017-09-11 DIAGNOSIS — Z23 Encounter for immunization: Secondary | ICD-10-CM | POA: Diagnosis not present

## 2017-09-11 DIAGNOSIS — Z6841 Body Mass Index (BMI) 40.0 and over, adult: Secondary | ICD-10-CM | POA: Diagnosis not present

## 2017-09-11 DIAGNOSIS — M4800 Spinal stenosis, site unspecified: Secondary | ICD-10-CM

## 2017-09-11 DIAGNOSIS — R296 Repeated falls: Secondary | ICD-10-CM

## 2017-09-11 DIAGNOSIS — G4700 Insomnia, unspecified: Secondary | ICD-10-CM

## 2017-09-11 MED ORDER — AMITRIPTYLINE HCL 75 MG PO TABS: 75.0000 mg | ORAL_TABLET | Freq: Every day | ORAL | 1 refills | Status: DC

## 2017-09-11 NOTE — Patient Instructions (Addendum)
Pneumovax injection given today  Will call with lab results  STOP DOXEPIN  INCREASE AMITRIPTYLINE 75MG  AT BEDTIME  Continue other medications as ordered  Will await form from building management  Follow up in 1 month for FMS, pain.

## 2017-09-11 NOTE — Telephone Encounter (Signed)
Received form from [facility] [phone] Fax: [phone] regarding need for accessible (handicap) apartment due to immobility and falls.   Printed last OV note and Facesheet and attached. Placed in Dr. Celene Skeen folder to review and sign.

## 2017-09-11 NOTE — Progress Notes (Signed)
Patient ID: Melinda [name], female   DOB: June 18, 1951, 67 y.o.   MRN: [id number]   Location:  [facility] OFFICE  Provider: DR Elmon Kirschner  Code Status:  Goals of Care:  Advanced Directives 08/14/2017  Does Patient Have a Medical Advance Directive? Yes  Type of Estate agent of [place];Living will  Does patient want to make changes to medical advance directive? No - Patient declined  Copy of Healthcare Power of Attorney in Chart? No - copy requested  Would patient like information on creating a medical advance directive? -     Chief Complaint  Patient presents with  . Medical Management of Chronic Issues    1 month follow-up HTN, prediabetes, and FMS/Chronic pain. Patient c/o asthma attacks and SOB. + fall risk   . Medication Refill    No refills needed unless medication changed   . Medication Management    Discuss alternatives to Zantac and doxepin ([facility] states patient is on duplicate therapy)    . Health Maintenance    Discuss need for HIV screening, patient would like to have testing. Discuss need for Pneumonia vaccine     HPI: Patient is a 67 y.o. female seen today for medical management of chronic diseases.  She continues to have frequent falls. She requests apt that is handicap accessible. She s/w apt bldg mx and they stated they would fax over a form to our office to complete. Needs pneumovax  FMS/chronic pain/right sciatica/neuropathy - pain overall uncontrolled on percocet, Robaxin, celebrex, and neurontin.  She was followed by neurology in the past. She has intermittent tingling and takes neurontin and doxepin. Insurance no longer covers doxepin and she has not taken it in several weeks. Her walker brakes do not work as well. She has fallen multiple times in last month  Depression - mood stable on citalopram. She is estranged from her family. Denies SI/HI. She has feelings of worthlessness. Currently takes amitriptyline. She gets parietal HA  HTN - BP  stable on metoprolol  Prediabetes - diet controlled. She avoids complex CHO and maintains healthy diet. A1c 5.3%  Obesity - weight up 2 lbs since last OV. Gait is unsteady and she has poor exercise tolerance   Past Medical History:  Diagnosis Date  . Arthritis    "99% of my body" (11/11/2016)  . Asthma   . Bursitis of both hips   . Cervical scoliosis   . Chronic back pain    "all over my back" (11/11/2016)  . Depressive disorder, not elsewhere classified   . Enthesopathy of hip region   . Environmental allergies    "I take Claritin qd; 365 days/year" (11/11/2016)  . Essential hypertension, benign   . Fibromyalgia   . Frequent falls   . Headache    "at least 1/wk; may last for 2 days or so" (11/11/2016)  . History of blood transfusion 1985   w/hysterectomy  . Lumbar stenosis   . Migraine    "a few/month" (11/11/2016)  . Muscle weakness (generalized)   . Myalgia and myositis, unspecified   . Other abnormal blood chemistry   . Other malaise and fatigue   . Raynaud's syndrome   . Sciatic nerve pain, left   . Sciatica   . Unspecified vitamin D deficiency     Past Surgical History:  Procedure Laterality Date  . ABDOMINAL HYSTERECTOMY  1985  . CESAREAN SECTION  1976; 1979  . SHOULDER OPEN ROTATOR CUFF REPAIR Left      reports that  has never smoked. she has never used smokeless tobacco. She reports that she does not drink alcohol or use drugs. Social History   Socioeconomic History  . Marital status: Divorced    Spouse name: Not on file  . Number of children: 3  . Years of education: 37  . Highest education level: Not on file  Social Needs  . Financial resource strain: Not hard at all  . Food insecurity - worry: Never true  . Food insecurity - inability: Never true  . Transportation needs - medical: No  . Transportation needs - non-medical: No  Occupational History    Comment: retired  Tobacco Use  . Smoking status: Never Smoker  . Smokeless tobacco: Never Used    Substance and Sexual Activity  . Alcohol use: No    Alcohol/week: 0.0 oz  . Drug use: No  . Sexual activity: Yes  Other Topics Concern  . Not on file  Social History Narrative   Patient lives with her grandson. Patient is retired.   Education some college.   Right handed.    Caffeine two cups daily.    Family History  Problem Relation Age of Onset  . Dementia Mother   . Heart disease Mother   . Hypertension Mother   . Diabetes Mother   . Diabetes Sister   . Cancer Sister        lung  . Cancer Other        Nepher (Mothers side)  . Diabetes Maternal Grandmother   . Arthritis Maternal Grandmother   . Diabetes Cousin        Mother's side  . Arthritis Sister   . Arthritis Cousin        Mother's side     No Known Allergies  Outpatient Encounter Medications as of 09/11/2017  Medication Sig  . amitriptyline (ELAVIL) 25 MG tablet TAKE TWO TABLETS AT BEDTIME FOR REST  . aspirin 81 MG tablet Take 81 mg by mouth daily.  . Calcium Carbonate-Vit D-Min (CALCIUM 1200 PO) Take 1 tablet by mouth daily.  . celecoxib (CELEBREX) 100 MG capsule TAKE 1 CAPSULE TWICE DAILY FOR PAINS  . Cholecalciferol (VITAMIN D3) 2000 units TABS Take by mouth daily.  . citalopram (CELEXA) 20 MG tablet Take 1 tablet (20 mg total) by mouth daily.  Marland Kitchen doxepin (SINEQUAN) 50 MG capsule Take 2 capsules (100 mg total) at bedtime by mouth.  . fluticasone (FLONASE) 50 MCG/ACT nasal spray Place 2 sprays into both nostrils daily as needed for allergies or rhinitis.  Marland Kitchen gabapentin (NEURONTIN) 400 MG capsule TAKE 3 CAPSULES TWICE DAILY  AND TAKE 2 CAPSULES AT BEDTIME  FOR  NEUROPATHY  . loratadine (EQ LORATADINE) 10 MG tablet Take 1 tablet (10 mg total) by mouth daily.  . methylPREDNISolone (MEDROL DOSEPAK) 4 MG TBPK tablet Take as directed - START 08/15/17  . metoprolol tartrate (LOPRESSOR) 50 MG tablet TAKE ONE TABLET TWICE DAILY FOR BLOOD PRESSURE  . oxyCODONE (ROXICODONE) 15 MG immediate release tablet Take 1 tablet  (15 mg total) by mouth every 4 (four) hours as needed for pain.  . VENTOLIN HFA 108 (90 Base) MCG/ACT inhaler INHALE 2 PUFFS  EVERY 6 (SIX) HOURS AS NEEDED FOR WHEEZING OR SHORTNESS OF BREATH.  . methocarbamol (ROBAXIN) 500 MG tablet TAKE ONE TABLET EVERY 8 HOURS AS NEEDED FOR MUSCLE SPASMS (Patient not taking: Reported on 09/11/2017)  . ranitidine (ZANTAC) 150 MG tablet Take 1 tablet (150 mg total) by mouth at bedtime. (Patient not taking:  Reported on 09/11/2017)  . [DISCONTINUED] Cholecalciferol (VITAMIN D3) 1000 units CAPS Take 1 capsule by mouth daily.   No facility-administered encounter medications on file as of 09/11/2017.     Review of Systems:  Review of Systems  Musculoskeletal: Positive for arthralgias, back pain, gait problem and joint swelling.  All other systems reviewed and are negative.   Health Maintenance  Topic Date Due  . HIV Screening  10/19/1966  . PNA vac Low Risk Adult (2 of 2 - PPSV23) 10/18/2016  . COLONOSCOPY  09/28/2017 (Originally 10/18/2001)  . INFLUENZA VACCINE  05/14/2018 (Originally 01/28/2017)  . PAP SMEAR  08/15/2018  . MAMMOGRAM  09/04/2019  . TETANUS/TDAP  01/25/2026  . DEXA SCAN  Completed  . Hepatitis C Screening  Completed    Physical Exam: Vitals:   09/11/17 0840  BP: 120/72  Pulse: 77  Resp: 10  Temp: 98.3 F (36.8 C)  TempSrc: Oral  SpO2: 97%  Weight: 244 lb (110.7 kg)  Height: 5\' 4"  (1.626 m)   Body mass index is 41.88 kg/m. Physical Exam  Constitutional: She is oriented to person, place, and time. She appears well-developed and well-nourished.  HENT:  Mouth/Throat: Oropharynx is clear and moist. No oropharyngeal exudate.  MMM; no oral thrush  Eyes: Pupils are equal, round, and reactive to light. No scleral icterus.  Neck: Neck supple. Carotid bruit is not present. No tracheal deviation present. No thyromegaly present.  Cardiovascular: Normal rate, regular rhythm and intact distal pulses. Exam reveals no gallop and no friction  rub.  Murmur: 1/6 SEM. No LE edema b/l. no calf TTP.   Pulmonary/Chest: Effort normal and breath sounds normal. No stridor. No respiratory distress. She has no wheezes. She has no rales.  Abdominal: Soft. Normal appearance and bowel sounds are normal. She exhibits no distension and no mass. There is no hepatomegaly. There is no tenderness. There is no rigidity, no rebound and no guarding. No hernia.  Obese  Musculoskeletal: She exhibits edema (small and large joints) and tenderness.  Lymphadenopathy:    She has no cervical adenopathy.  Neurological: She is alert and oriented to person, place, and time. Gait (uses rolling walker with seat and brakes) abnormal.  Skin: Skin is warm and dry. No rash noted.  Psychiatric: She has a normal mood and affect. Her behavior is normal. Judgment and thought content normal.    Labs reviewed: Basic Metabolic Panel: Recent Labs    11/11/16 1208 11/11/16 1452 01/02/17 0932 05/20/17 0903  NA 137  --  138 139  K 4.3  --  4.4 3.8  CL 104  --  103 104  CO2 25  --  26 29  GLUCOSE 93  --  77 59*  BUN 11  --  13 11  CREATININE 0.91 0.85 0.89 0.83  CALCIUM 8.9  --  9.0 8.8   Liver Function Tests: Recent Labs    01/02/17 0932 05/20/17 0903  AST 12 12  ALT 7 7  ALKPHOS 69  --   BILITOT 0.7 0.4  PROT 6.6 6.7  ALBUMIN 3.9  --    No results for input(s): LIPASE, AMYLASE in the last 8760 hours. No results for input(s): AMMONIA in the last 8760 hours. CBC: Recent Labs    11/11/16 1208 11/11/16 1452  WBC 6.1 5.8  NEUTROABS 3.0  --   HGB 11.7* 11.8*  HCT 37.4 37.4  MCV 86.2 86.2  PLT 367 354   Lipid Panel: Recent Labs    01/02/17 0932  CHOL 178  HDL 55  LDLCALC 101*  TRIG 110  CHOLHDL 3.2   Lab Results  Component Value Date   HGBA1C 5.3 01/02/2017    Procedures since last visit: Dexascan  Result Date: 09/03/2017 EXAM: DUAL X-RAY ABSORPTIOMETRY (DXA) FOR BONE MINERAL DENSITY IMPRESSION: Referring Physician:  Selinda Flavin  PATIENT: Name: Sibyl, Wehrli Patient ID: [id number] Birth Date: 01/16/1951 Height: 63.0 in. Sex: Female Measured: 09/03/2017 Weight: 236.9 lbs. Indications: Celexa, Depression, Estrogen Deficient, Gabapentin, Height Loss (781.91), Hysterectomy, Postmenopausal, Secondary Osteoporosis Fractures: None Treatments: Vitamin D (E933.5) ASSESSMENT: The BMD measured at Femur Total Left is 0.756 g/cm2 with a T-score of -2.0. This patient is considered osteopenic according to World Health Organization [facility]) criteria. Site Region Measured Date Measured Age YA BMD Significant CHANGE T-score DualFemur Total Left 09/03/2017    65.8         -2.0    0.756 g/cm2 AP Spine  L1-L4      09/03/2017    65.8         -0.4    1.133 g/cm2 World Health Organization [facility]) criteria for post-menopausal, Caucasian Women: Normal       T-score at or above -1 SD Osteopenia   T-score between -1 and -2.5 SD Osteoporosis T-score at or below -2.5 SD RECOMMENDATION: National Osteoporosis Foundation recommends that FDA-approved medical therapies be considered in postmenopausal women and men age 27 or older with a: 1. Hip or vertebral (clinical or morphometric) fracture. 2. T-score of less than or equal to -2.5 at the spine or hip. 3. Ten-year fracture probability by FRAX of 3% or greater for hip fracture or 20% or greater for major osteoporotic fracture. All treatment decisions require clinical judgment and consideration of individual patient factors, including patient preferences, co-morbidities, previous drug use, risk factors not captured in the FRAX model (e.g. falls, vitamin D deficiency, increased bone turnover, interval significant decline in bone density) and possible under- or over-estimation of fracture risk by FRAX. All patients should ensure an adequate intake of dietary calcium (1200 mg/d) and vitamin D (800 IU daily) unless contraindicated. FOLLOW-UP: People with diagnosed cases of osteoporosis or at high risk for fracture should have regular  bone mineral density tests. For patients eligible for Medicare, routine testing is allowed once every 2 years. The testing frequency can be increased to one year for patients who have rapidly progressing disease, those who are receiving or discontinuing medical therapy to restore bone mass, or have additional risk factors. FRAX* 10-year Probability of Fracture Based on femoral neck BMD: DualFemur (Right) Major Osteoporotic Fracture: 4.2% Hip Fracture:                0.6% Population:                  [place] (Black) Risk Factors:                Secondary Osteoporosis *FRAX is a Armed forces logistics/support/administrative officer of the [employer] of [employer] for Metabolic Bone Disease, a World Science writer (WHO) [employer]. ASSESSMENT: The probability of a major osteoporotic fracture is 4.2 % within the next ten years. The probability of hip fracture is 0.6 % within the next 10 years. Electronically Signed   By: Myles Rosenthal M.D.   On: 09/03/2017 11:43   Mm Digital Screening Bilateral  Result Date: 09/03/2017 CLINICAL DATA:  Screening. EXAM: DIGITAL SCREENING BILATERAL MAMMOGRAM WITH CAD COMPARISON:  None. ACR Breast Density Category b: There are scattered areas of fibroglandular density.  FINDINGS: There are no findings suspicious for malignancy. Images were processed with CAD. IMPRESSION: No mammographic evidence of malignancy. A result letter of this screening mammogram will be mailed directly to the patient. RECOMMENDATION: Screening mammogram in one year. (Code:SM-B-01Y) BI-RADS CATEGORY  1: Negative. Electronically Signed   By: Gerome Sam III M.D   On: 09/03/2017 16:54    Assessment/Plan   ICD-10-CM   1. Frequent falls R29.6 TSH  2. Chronic pain syndrome G89.4   3. Unsteady gait R26.81   4. Fibromyalgia M79.7 amitriptyline (ELAVIL) 75 MG tablet  5. High risk medication use Z79.899 CBC with Differential/Platelets    CMP with eGFR([facility])    Urinalysis with Reflex Microscopic  6. Severe episode of  recurrent major depressive disorder, without psychotic features (HCC) F33.2 CMP with eGFR([facility])    TSH    amitriptyline (ELAVIL) 75 MG tablet  7. Idiopathic peripheral neuropathy G60.9   8. Spinal stenosis, unspecified spinal region M48.00   9. Prediabetes R73.03 CMP with eGFR([facility])    Urinalysis with Reflex Microscopic    Hemoglobin A1c    Pneumococcal polysaccharide vaccine 23-valent greater than or equal to 2yo subcutaneous/IM  10. Essential hypertension I10 CBC with Differential/Platelets    Urinalysis with Reflex Microscopic  11. Insomnia, unspecified type G47.00 amitriptyline (ELAVIL) 75 MG tablet  12. Need for 23-polyvalent pneumococcal polysaccharide vaccine Z23 Pneumococcal polysaccharide vaccine 23-valent greater than or equal to 2yo subcutaneous/IM   Pneumovax injection given today  Will call with lab results  STOP DOXEPIN  INCREASE AMITRIPTYLINE 75MG  AT BEDTIME  Continue other medications as ordered  Will await form from building management  Follow up in 1 month for FMS, pain.    Anastacia Reinecke S. Ancil Linsey  [facility] [address] [place], [place] [address] [phone] Cell (Monday-Friday 8 AM - 5 PM) [phone] After 5 PM and follow prompts

## 2017-09-12 DIAGNOSIS — R7303 Prediabetes: Secondary | ICD-10-CM | POA: Insufficient documentation

## 2017-09-12 DIAGNOSIS — Z79899 Other long term (current) drug therapy: Secondary | ICD-10-CM | POA: Insufficient documentation

## 2017-09-12 DIAGNOSIS — R296 Repeated falls: Secondary | ICD-10-CM | POA: Insufficient documentation

## 2017-09-12 LAB — COMPLETE METABOLIC PANEL WITH GFR
AG Ratio: 1.4 (calc) (ref 1.0–2.5)
ALT: 9 U/L (ref 6–29)
AST: 10 U/L (ref 10–35)
Albumin: 3.9 g/dL (ref 3.6–5.1)
Alkaline phosphatase (APISO): 73 U/L (ref 33–130)
BUN: 12 mg/dL (ref 7–25)
CO2: 28 mmol/L (ref 20–32)
Calcium: 9.3 mg/dL (ref 8.6–10.4)
Chloride: 105 mmol/L (ref 98–110)
Creat: 0.88 mg/dL (ref 0.50–0.99)
GFR, Est African American: 80 mL/min/{1.73_m2} (ref >=60)
GFR, Est Non African American: 69 mL/min/{1.73_m2} (ref >=60)
Globulin: 2.7 g/dL (calc) (ref 1.9–3.7)
Glucose, Bld: 90 mg/dL (ref 65–99)
Potassium: 4.4 mmol/L (ref 3.5–5.3)
Sodium: 140 mmol/L (ref 135–146)
Total Bilirubin: 0.6 mg/dL (ref 0.2–1.2)
Total Protein: 6.6 g/dL (ref 6.1–8.1)

## 2017-09-12 LAB — CBC WITH DIFFERENTIAL/PLATELET
Basophils Absolute: 72 cells/uL (ref 0–200)
Basophils Relative: 1.3 %
Eosinophils Absolute: 275 cells/uL (ref 15–500)
Eosinophils Relative: 5 %
HCT: 35.4 % (ref 35.0–45.0)
Hemoglobin: 12 g/dL (ref 11.7–15.5)
Lymphs Abs: 1997 cells/uL (ref 850–3900)
MCH: 28 pg (ref 27.0–33.0)
MCHC: 33.9 g/dL (ref 32.0–36.0)
MCV: 82.7 fL (ref 80.0–100.0)
MPV: 11.3 fL (ref 7.5–12.5)
Monocytes Relative: 13.6 %
Neutro Abs: 2409 cells/uL (ref 1500–7800)
Neutrophils Relative %: 43.8 %
Platelets: 344 10*3/uL (ref 140–400)
RBC: 4.28 10*6/uL (ref 3.80–5.10)
RDW: 13 % (ref 11.0–15.0)
Total Lymphocyte: 36.3 %
WBC mixed population: 748 cells/uL (ref 200–950)
WBC: 5.5 10*3/uL (ref 3.8–10.8)

## 2017-09-12 LAB — HEMOGLOBIN A1C
Hgb A1c MFr Bld: 5.4 % of total Hgb (ref <5.7)
Mean Plasma Glucose: 108 (calc)
eAG (mmol/L): 6 (calc)

## 2017-09-12 LAB — URINALYSIS, ROUTINE W REFLEX MICROSCOPIC
Bacteria, UA: NONE SEEN /HPF
Bilirubin Urine: NEGATIVE
Glucose, UA: NEGATIVE
Hgb urine dipstick: NEGATIVE
Hyaline Cast: NONE SEEN /LPF
Ketones, ur: NEGATIVE
Nitrite: NEGATIVE
Protein, ur: NEGATIVE
RBC / HPF: NONE SEEN /HPF (ref 0–2)
Specific Gravity, Urine: 1.027 (ref 1.001–1.03)
pH: <=5 (ref 5.0–8.0)

## 2017-09-12 LAB — TSH: TSH: 2.02 mIU/L (ref 0.40–4.50)

## 2017-09-14 ENCOUNTER — Other Ambulatory Visit: Payer: Self-pay

## 2017-09-14 ENCOUNTER — Encounter: Payer: Self-pay | Admitting: Gastroenterology

## 2017-09-14 ENCOUNTER — Encounter: Payer: Self-pay | Admitting: *Deleted

## 2017-09-14 ENCOUNTER — Ambulatory Visit (AMBULATORY_SURGERY_CENTER): Payer: Self-pay

## 2017-09-14 VITALS — Ht 64.0 in | Wt 236.4 lb

## 2017-09-14 DIAGNOSIS — Z1211 Encounter for screening for malignant neoplasm of colon: Secondary | ICD-10-CM

## 2017-09-14 MED ORDER — NA SULFATE-K SULFATE-MG SULF 17.5-3.13-1.6 GM/177ML PO SOLN: 1.0000 | Freq: Once | ORAL | 0 refills | Status: AC

## 2017-09-14 NOTE — Progress Notes (Signed)
Denies allergies to eggs or soy products. Denies complication of anesthesia or sedation. Denies use of weight loss medication. Denies use of O2.   Emmi instructions declined.  

## 2017-09-18 ENCOUNTER — Other Ambulatory Visit: Payer: Self-pay

## 2017-09-18 DIAGNOSIS — M797 Fibromyalgia: Secondary | ICD-10-CM

## 2017-09-18 DIAGNOSIS — G894 Chronic pain syndrome: Secondary | ICD-10-CM

## 2017-09-18 DIAGNOSIS — G609 Hereditary and idiopathic neuropathy, unspecified: Secondary | ICD-10-CM

## 2017-09-18 DIAGNOSIS — M5430 Sciatica, unspecified side: Secondary | ICD-10-CM

## 2017-09-18 DIAGNOSIS — M4800 Spinal stenosis, site unspecified: Secondary | ICD-10-CM

## 2017-09-18 MED ORDER — OXYCODONE HCL 15 MG PO TABS: 15.0000 mg | ORAL_TABLET | ORAL | 0 refills | Status: DC | PRN

## 2017-09-18 NOTE — Telephone Encounter (Signed)
Patient called to request a refill on oxycodone 15 mg. Prescription was pended to provider after verifying [place] database.

## 2017-09-21 ENCOUNTER — Telehealth: Payer: Self-pay | Admitting: *Deleted

## 2017-09-21 ENCOUNTER — Other Ambulatory Visit: Payer: Self-pay

## 2017-09-21 DIAGNOSIS — M4800 Spinal stenosis, site unspecified: Secondary | ICD-10-CM

## 2017-09-21 DIAGNOSIS — M5430 Sciatica, unspecified side: Secondary | ICD-10-CM

## 2017-09-21 DIAGNOSIS — G894 Chronic pain syndrome: Secondary | ICD-10-CM

## 2017-09-21 DIAGNOSIS — G609 Hereditary and idiopathic neuropathy, unspecified: Secondary | ICD-10-CM

## 2017-09-21 DIAGNOSIS — M797 Fibromyalgia: Secondary | ICD-10-CM

## 2017-09-21 NOTE — Telephone Encounter (Signed)
Received call from Sarah with [facility] and she stated that they received a Rx for patient's Oxycodone and they do not fill Narcotic Rx's. Stated that this Rx needs to be sent to a local pharmacy.   N[facility]SRS Verified LR: 08/27/2017 Pharmacy Confirmed Pended Rx and sent to Dr. Montez Morita for approval.

## 2017-09-21 NOTE — Telephone Encounter (Signed)
PSC office manager, Aram Beecham, spoke with [facility] regarding oxycodone prescription for patient. [facility] declined to fill most recent Rx dated 09/18/17 because they felt that this medication should be monitored by a local pharmacy.   I spoke with patient and she verified [facility] [address] and [address] as her preferred [facility].   New rx has been pended to provider. [place] database was verified.

## 2017-09-22 ENCOUNTER — Encounter: Payer: Self-pay | Admitting: Gastroenterology

## 2017-09-22 DIAGNOSIS — H02889 Meibomian gland dysfunction of unspecified eye, unspecified eyelid: Secondary | ICD-10-CM | POA: Diagnosis not present

## 2017-09-22 DIAGNOSIS — R6889 Other general symptoms and signs: Secondary | ICD-10-CM | POA: Diagnosis not present

## 2017-09-22 DIAGNOSIS — H2513 Age-related nuclear cataract, bilateral: Secondary | ICD-10-CM | POA: Diagnosis not present

## 2017-09-23 ENCOUNTER — Encounter: Payer: Medicare HMO | Admitting: Gastroenterology

## 2017-09-24 ENCOUNTER — Other Ambulatory Visit: Payer: Self-pay

## 2017-09-24 DIAGNOSIS — M4800 Spinal stenosis, site unspecified: Secondary | ICD-10-CM

## 2017-09-24 DIAGNOSIS — G609 Hereditary and idiopathic neuropathy, unspecified: Secondary | ICD-10-CM

## 2017-09-24 DIAGNOSIS — M797 Fibromyalgia: Secondary | ICD-10-CM

## 2017-09-24 DIAGNOSIS — G894 Chronic pain syndrome: Secondary | ICD-10-CM

## 2017-09-24 DIAGNOSIS — M5430 Sciatica, unspecified side: Secondary | ICD-10-CM

## 2017-09-24 MED ORDER — OXYCODONE HCL 15 MG PO TABS: 15.0000 mg | ORAL_TABLET | ORAL | 0 refills | Status: DC | PRN

## 2017-09-24 NOTE — Telephone Encounter (Signed)
Medication refill must be sent to local pharmacy per [facility]. Please re-authorized refill.    [place] database was checked.

## 2017-10-02 ENCOUNTER — Other Ambulatory Visit: Payer: Self-pay | Admitting: Internal Medicine

## 2017-10-08 ENCOUNTER — Telehealth: Payer: Self-pay

## 2017-10-08 DIAGNOSIS — G4700 Insomnia, unspecified: Secondary | ICD-10-CM

## 2017-10-08 DIAGNOSIS — M797 Fibromyalgia: Secondary | ICD-10-CM

## 2017-10-08 DIAGNOSIS — F332 Major depressive disorder, recurrent severe without psychotic features: Secondary | ICD-10-CM

## 2017-10-08 NOTE — Telephone Encounter (Signed)
[facility] requesting refill for Amitriptyline 75 mg # 90  I called pharmacy to confirm refill on file, rx was sent in on 09/11/17 for 30 with 1 refill, pharmacist confirmed that patient has a refill on file, yet going forward patient would like a 90 day supply vs 30 and they are requesting this for future refills.  Dr.Carter please advise if ok to call in rx for #90

## 2017-10-08 NOTE — Telephone Encounter (Signed)
Ok for 90 day rx.

## 2017-10-09 MED ORDER — AMITRIPTYLINE HCL 75 MG PO TABS: 75.0000 mg | ORAL_TABLET | Freq: Every day | ORAL | 0 refills | Status: DC

## 2017-10-09 NOTE — Telephone Encounter (Signed)
RX sent

## 2017-10-10 ENCOUNTER — Encounter (HOSPITAL_COMMUNITY): Payer: Self-pay

## 2017-10-10 ENCOUNTER — Emergency Department (HOSPITAL_COMMUNITY): Payer: Medicare HMO

## 2017-10-10 ENCOUNTER — Emergency Department (HOSPITAL_COMMUNITY)
Admission: EM | Admit: 2017-10-10 | Discharge: 2017-10-11 | Disposition: A | Discharge status: Home / Self Care | Payer: Medicare HMO | Attending: Emergency Medicine | Admitting: Emergency Medicine

## 2017-10-10 DIAGNOSIS — T148XXA Other injury of unspecified body region, initial encounter: Secondary | ICD-10-CM | POA: Diagnosis not present

## 2017-10-10 DIAGNOSIS — Y929 Unspecified place or not applicable: Secondary | ICD-10-CM | POA: Diagnosis not present

## 2017-10-10 DIAGNOSIS — M546 Pain in thoracic spine: Secondary | ICD-10-CM | POA: Diagnosis not present

## 2017-10-10 DIAGNOSIS — M25562 Pain in left knee: Secondary | ICD-10-CM | POA: Diagnosis not present

## 2017-10-10 DIAGNOSIS — Y939 Activity, unspecified: Secondary | ICD-10-CM | POA: Insufficient documentation

## 2017-10-10 DIAGNOSIS — W19XXXA Unspecified fall, initial encounter: Secondary | ICD-10-CM

## 2017-10-10 DIAGNOSIS — M542 Cervicalgia: Secondary | ICD-10-CM | POA: Diagnosis not present

## 2017-10-10 DIAGNOSIS — S8992XA Unspecified injury of left lower leg, initial encounter: Secondary | ICD-10-CM | POA: Diagnosis not present

## 2017-10-10 DIAGNOSIS — S199XXA Unspecified injury of neck, initial encounter: Secondary | ICD-10-CM | POA: Diagnosis not present

## 2017-10-10 DIAGNOSIS — Y999 Unspecified external cause status: Secondary | ICD-10-CM | POA: Diagnosis not present

## 2017-10-10 DIAGNOSIS — R51 Headache: Secondary | ICD-10-CM | POA: Diagnosis not present

## 2017-10-10 DIAGNOSIS — S0990XA Unspecified injury of head, initial encounter: Secondary | ICD-10-CM | POA: Insufficient documentation

## 2017-10-10 DIAGNOSIS — M5489 Other dorsalgia: Secondary | ICD-10-CM | POA: Diagnosis not present

## 2017-10-10 DIAGNOSIS — M545 Low back pain: Secondary | ICD-10-CM | POA: Diagnosis not present

## 2017-10-10 DIAGNOSIS — I10 Essential (primary) hypertension: Secondary | ICD-10-CM | POA: Insufficient documentation

## 2017-10-10 DIAGNOSIS — S79912A Unspecified injury of left hip, initial encounter: Secondary | ICD-10-CM | POA: Diagnosis not present

## 2017-10-10 DIAGNOSIS — M25552 Pain in left hip: Secondary | ICD-10-CM | POA: Diagnosis not present

## 2017-10-10 MED ORDER — HYDROMORPHONE HCL 2 MG/ML IJ SOLN
1.0000 mg | Freq: Once | INTRAMUSCULAR | Status: AC
  Administered 2017-10-10: 1 mg via INTRAMUSCULAR
  Filled 2017-10-10: qty 1

## 2017-10-10 NOTE — ED Notes (Signed)
Pt transported to xray and then to CT.

## 2017-10-10 NOTE — ED Notes (Signed)
Patient transported to X-ray 
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
## 2017-10-10 NOTE — ED Notes (Signed)
Pt still getting scans at this time-Monique,RN

## 2017-10-11 DIAGNOSIS — S199XXA Unspecified injury of neck, initial encounter: Secondary | ICD-10-CM | POA: Diagnosis not present

## 2017-10-11 MED ORDER — METHOCARBAMOL 500 MG PO TABS
500.0000 mg | ORAL_TABLET | Freq: Once | ORAL | Status: AC
  Administered 2017-10-11: 500 mg via ORAL
  Filled 2017-10-11: qty 1

## 2017-10-11 MED ORDER — METHOCARBAMOL 500 MG PO TABS: 500.0000 mg | ORAL_TABLET | Freq: Two times a day (BID) | ORAL | 0 refills | Status: DC

## 2017-10-23 ENCOUNTER — Encounter: Payer: Self-pay | Admitting: Internal Medicine

## 2017-10-23 ENCOUNTER — Ambulatory Visit (INDEPENDENT_AMBULATORY_CARE_PROVIDER_SITE_OTHER): Payer: Medicare HMO | Admitting: Internal Medicine

## 2017-10-23 VITALS — BP 122/74 | HR 66 | Temp 98.1°F | Ht 64.0 in | Wt 246.0 lb

## 2017-10-23 DIAGNOSIS — M5430 Sciatica, unspecified side: Secondary | ICD-10-CM | POA: Diagnosis not present

## 2017-10-23 DIAGNOSIS — M797 Fibromyalgia: Secondary | ICD-10-CM | POA: Diagnosis not present

## 2017-10-23 DIAGNOSIS — J302 Other seasonal allergic rhinitis: Secondary | ICD-10-CM

## 2017-10-23 DIAGNOSIS — G609 Hereditary and idiopathic neuropathy, unspecified: Secondary | ICD-10-CM

## 2017-10-23 DIAGNOSIS — M4800 Spinal stenosis, site unspecified: Secondary | ICD-10-CM

## 2017-10-23 DIAGNOSIS — G894 Chronic pain syndrome: Secondary | ICD-10-CM | POA: Diagnosis not present

## 2017-10-23 MED ORDER — OXYCODONE HCL 15 MG PO TABS: 15.0000 mg | ORAL_TABLET | ORAL | 0 refills | Status: DC | PRN

## 2017-10-23 MED ORDER — LORATADINE 10 MG PO TABS: 10.0000 mg | ORAL_TABLET | Freq: Every day | ORAL | 1 refills | Status: DC

## 2017-10-23 NOTE — Patient Instructions (Addendum)
START LORATADINE 10MG  DAILY FOR SEASONAL ALLERGY  Continue other medications as ordered  Please complete living will/HCPOA and return notarized copy to office to be scanned into your chart  Follow up in 2 mos for chronic pain, seasonal allergy, depression, FMS
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
## 2017-10-30 ENCOUNTER — Encounter: Payer: Self-pay | Admitting: Internal Medicine

## 2017-10-30 ENCOUNTER — Ambulatory Visit (INDEPENDENT_AMBULATORY_CARE_PROVIDER_SITE_OTHER): Payer: Medicare HMO | Admitting: Internal Medicine

## 2017-10-30 VITALS — BP 124/80 | HR 81 | Temp 97.8°F | Ht 64.0 in | Wt 244.0 lb

## 2017-10-30 DIAGNOSIS — M5430 Sciatica, unspecified side: Secondary | ICD-10-CM | POA: Diagnosis not present

## 2017-10-30 DIAGNOSIS — M8580 Other specified disorders of bone density and structure, unspecified site: Secondary | ICD-10-CM | POA: Diagnosis not present

## 2017-10-30 DIAGNOSIS — G609 Hereditary and idiopathic neuropathy, unspecified: Secondary | ICD-10-CM | POA: Diagnosis not present

## 2017-10-30 DIAGNOSIS — M797 Fibromyalgia: Secondary | ICD-10-CM

## 2017-10-30 DIAGNOSIS — M5415 Radiculopathy, thoracolumbar region: Secondary | ICD-10-CM | POA: Diagnosis not present

## 2017-10-30 DIAGNOSIS — R296 Repeated falls: Secondary | ICD-10-CM | POA: Diagnosis not present

## 2017-10-30 DIAGNOSIS — R2681 Unsteadiness on feet: Secondary | ICD-10-CM

## 2017-10-30 DIAGNOSIS — M4807 Spinal stenosis, lumbosacral region: Secondary | ICD-10-CM

## 2017-10-30 DIAGNOSIS — G894 Chronic pain syndrome: Secondary | ICD-10-CM

## 2017-10-30 MED ORDER — DIAZEPAM 2 MG PO TABS: 2.0000 mg | ORAL_TABLET | Freq: Once | ORAL | 0 refills | Status: AC
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
## 2017-10-30 NOTE — Patient Instructions (Signed)
Take all medications as ordered  No heavy lifting (>1 gallon of milk)  Use walker with walking/standing  Will call with MRI appt  May need PT/OT pending imaging results  Follow up as scheduled or sooner if need be

## 2017-11-11 ENCOUNTER — Other Ambulatory Visit: Payer: Self-pay | Admitting: Internal Medicine

## 2017-11-11 DIAGNOSIS — K219 Gastro-esophageal reflux disease without esophagitis: Secondary | ICD-10-CM

## 2017-11-19 ENCOUNTER — Other Ambulatory Visit: Payer: Self-pay

## 2017-11-19 DIAGNOSIS — M4800 Spinal stenosis, site unspecified: Secondary | ICD-10-CM

## 2017-11-19 DIAGNOSIS — G609 Hereditary and idiopathic neuropathy, unspecified: Secondary | ICD-10-CM

## 2017-11-19 DIAGNOSIS — G894 Chronic pain syndrome: Secondary | ICD-10-CM

## 2017-11-19 DIAGNOSIS — M797 Fibromyalgia: Secondary | ICD-10-CM

## 2017-11-19 DIAGNOSIS — M5430 Sciatica, unspecified side: Secondary | ICD-10-CM

## 2017-11-19 MED ORDER — OXYCODONE HCL 15 MG PO TABS: 15.0000 mg | ORAL_TABLET | ORAL | 0 refills | Status: DC | PRN

## 2017-11-19 NOTE — Telephone Encounter (Signed)
Patient called the office to ask for a refill on oxycodone 15 mg. Pt is due on 11/22/17 but she states that she is going out of town this weekend and needs to be able to pick up her medication by noon on Friday 11/20/17.   Rx was pended to provider for approval  after verifying last fill date, provider, and quantity on [place] database. Last fill date was 10/23/17

## 2017-11-19 NOTE — Addendum Note (Signed)
Addended by: Chriss Driver on: 11/19/2017 02:54 PM   Modules accepted: Orders

## 2017-11-20 ENCOUNTER — Ambulatory Visit
Admission: RE | Admit: 2017-11-20 | Discharge: 2017-11-20 | Disposition: A | Discharge status: Home / Self Care | Payer: Medicare HMO | Source: Ambulatory Visit | Attending: Internal Medicine | Admitting: Internal Medicine

## 2017-11-20 DIAGNOSIS — R2681 Unsteadiness on feet: Secondary | ICD-10-CM

## 2017-11-20 DIAGNOSIS — M4807 Spinal stenosis, lumbosacral region: Secondary | ICD-10-CM

## 2017-11-20 DIAGNOSIS — M5415 Radiculopathy, thoracolumbar region: Secondary | ICD-10-CM

## 2017-11-20 DIAGNOSIS — R531 Weakness: Secondary | ICD-10-CM | POA: Diagnosis not present

## 2017-11-20 DIAGNOSIS — M8580 Other specified disorders of bone density and structure, unspecified site: Secondary | ICD-10-CM

## 2017-11-20 DIAGNOSIS — M5430 Sciatica, unspecified side: Secondary | ICD-10-CM

## 2017-11-20 DIAGNOSIS — R296 Repeated falls: Secondary | ICD-10-CM

## 2017-11-20 DIAGNOSIS — M48061 Spinal stenosis, lumbar region without neurogenic claudication: Secondary | ICD-10-CM | POA: Diagnosis not present

## 2017-12-21 ENCOUNTER — Other Ambulatory Visit: Payer: Self-pay | Admitting: *Deleted

## 2017-12-21 DIAGNOSIS — M5430 Sciatica, unspecified side: Secondary | ICD-10-CM

## 2017-12-21 DIAGNOSIS — M797 Fibromyalgia: Secondary | ICD-10-CM

## 2017-12-21 DIAGNOSIS — G894 Chronic pain syndrome: Secondary | ICD-10-CM

## 2017-12-21 DIAGNOSIS — M4800 Spinal stenosis, site unspecified: Secondary | ICD-10-CM

## 2017-12-21 DIAGNOSIS — G609 Hereditary and idiopathic neuropathy, unspecified: Secondary | ICD-10-CM

## 2017-12-21 MED ORDER — OXYCODONE HCL 15 MG PO TABS: 15.0000 mg | ORAL_TABLET | ORAL | 0 refills | Status: DC | PRN

## 2017-12-21 NOTE — Telephone Encounter (Signed)
Patient requested [place] Database Verified LR: 11/19/2017 Pended Rx and sent to Dr. Montez Morita for approval.

## 2017-12-23 ENCOUNTER — Ambulatory Visit: Payer: Medicare HMO | Admitting: Internal Medicine

## 2017-12-24 ENCOUNTER — Other Ambulatory Visit: Payer: Self-pay

## 2017-12-25 ENCOUNTER — Encounter (HOSPITAL_COMMUNITY): Payer: Self-pay | Admitting: Emergency Medicine

## 2017-12-25 ENCOUNTER — Emergency Department (HOSPITAL_COMMUNITY): Payer: Medicare HMO

## 2017-12-25 ENCOUNTER — Emergency Department (HOSPITAL_COMMUNITY)
Admission: EM | Admit: 2017-12-25 | Discharge: 2017-12-25 | Disposition: A | Discharge status: Home / Self Care | Payer: Medicare HMO | Attending: Emergency Medicine | Admitting: Emergency Medicine

## 2017-12-25 ENCOUNTER — Other Ambulatory Visit: Payer: Self-pay

## 2017-12-25 DIAGNOSIS — I10 Essential (primary) hypertension: Secondary | ICD-10-CM | POA: Diagnosis not present

## 2017-12-25 DIAGNOSIS — J45909 Unspecified asthma, uncomplicated: Secondary | ICD-10-CM | POA: Insufficient documentation

## 2017-12-25 DIAGNOSIS — R05 Cough: Secondary | ICD-10-CM | POA: Insufficient documentation

## 2017-12-25 DIAGNOSIS — R002 Palpitations: Secondary | ICD-10-CM | POA: Diagnosis not present

## 2017-12-25 DIAGNOSIS — R079 Chest pain, unspecified: Secondary | ICD-10-CM | POA: Diagnosis not present

## 2017-12-25 DIAGNOSIS — Z79899 Other long term (current) drug therapy: Secondary | ICD-10-CM | POA: Insufficient documentation

## 2017-12-25 DIAGNOSIS — R059 Cough, unspecified: Secondary | ICD-10-CM

## 2017-12-25 DIAGNOSIS — Z7982 Long term (current) use of aspirin: Secondary | ICD-10-CM | POA: Insufficient documentation

## 2017-12-25 DIAGNOSIS — R0602 Shortness of breath: Secondary | ICD-10-CM | POA: Diagnosis not present

## 2017-12-25 LAB — CBC
HCT: 38.4 % (ref 36.0–46.0)
Hemoglobin: 11.7 g/dL — ABNORMAL LOW (ref 12.0–15.0)
MCH: 27.1 pg (ref 26.0–34.0)
MCHC: 30.5 g/dL (ref 30.0–36.0)
MCV: 89.1 fL (ref 78.0–100.0)
Platelets: 388 10*3/uL (ref 150–400)
RBC: 4.31 MIL/uL (ref 3.87–5.11)
RDW: 12.4 % (ref 11.5–15.5)
WBC: 6.3 10*3/uL (ref 4.0–10.5)

## 2017-12-25 LAB — I-STAT TROPONIN, ED: Troponin i, poc: 0 ng/mL (ref 0.00–0.08)

## 2017-12-25 LAB — BASIC METABOLIC PANEL
Anion gap: 9 (ref 5–15)
BUN: 12 mg/dL (ref 8–23)
CO2: 24 mmol/L (ref 22–32)
Calcium: 8.8 mg/dL — ABNORMAL LOW (ref 8.9–10.3)
Chloride: 104 mmol/L (ref 98–111)
Creatinine, Ser: 1.04 mg/dL — ABNORMAL HIGH (ref 0.44–1.00)
GFR calc Af Amer: >60 mL/min (ref >60)
GFR calc non Af Amer: 55 mL/min — ABNORMAL LOW (ref >60)
Glucose, Bld: 132 mg/dL — ABNORMAL HIGH (ref 70–99)
Potassium: 4 mmol/L (ref 3.5–5.1)
Sodium: 137 mmol/L (ref 135–145)

## 2017-12-25 MED ORDER — PREDNISONE 10 MG (21) PO TBPK: ORAL_TABLET | ORAL | 0 refills | Status: DC

## 2017-12-25 MED ORDER — AZITHROMYCIN 250 MG PO TABS: 250.0000 mg | ORAL_TABLET | Freq: Every day | ORAL | 0 refills | Status: DC

## 2017-12-25 NOTE — ED Provider Notes (Signed)
[facility] EMERGENCY DEPARTMENT Provider Note   CSN: [id number] Arrival date & time: 12/25/17  0734     History   Chief Complaint Chief Complaint  Patient presents with  . Cough  . Chest Pain    HPI Melinda [name] is a 67 y.o. female.  HPI   Melinda [name] is a 67 y.o. female, with a history of anxiety, asthma, presenting to the ED with productive cough with green sputum for the last week. States cough began shortly prior to leaving [facility].  She was in a hotel room for a few days with her sister who is a heavy smoker.  She notes soreness in the left chest, but only with coughing and palpation States her cough improved and her shortness of breath resolved prior to arrival, however, she still wanted to "get checked out." Denies current chest pain, shortness of breath, fever/chills, N/V/D, abdominal pain, dizziness, diaphoresis, or any other complaints.   Past Medical History:  Diagnosis Date  . Allergy   . Anxiety   . Arthritis    "99% of my body" (11/11/2016)  . Asthma   . Bursitis of both hips   . Cataract   . Cervical scoliosis   . Chronic back pain    "all over my back" (11/11/2016)  . Depressive disorder, not elsewhere classified   . Enthesopathy of hip region   . Environmental allergies    "I take Claritin qd; 365 days/year" (11/11/2016)  . Essential hypertension, benign   . Fibromyalgia   . Frequent falls   . GERD (gastroesophageal reflux disease)   . Headache    "at least 1/wk; may last for 2 days or so" (11/11/2016)  . History of blood transfusion 1985   w/hysterectomy  . Lumbar stenosis   . Migraine    "a few/month" (11/11/2016)  . Muscle weakness (generalized)   . Myalgia and myositis, unspecified   . Osteoporosis   . Other abnormal blood chemistry   . Other malaise and fatigue   . Raynaud's syndrome   . Sciatic nerve pain, left   . Sciatica   . Unspecified vitamin D deficiency     Patient Active Problem List   Diagnosis Date  Noted  . Prediabetes 09/12/2017  . High risk medication use 09/12/2017  . Frequent falls 09/12/2017  . Chronic pain syndrome 08/14/2017  . Gastroesophageal reflux disease 08/14/2017  . Cough variant asthma 03/14/2017  . Pulmonary hypertension (HCC) 03/12/2017  . Chest pain 11/11/2016  . Asthma 11/11/2016  . Atypical chest pain   . Costochondritis   . Chronic congestive heart failure (HCC)   . MDD (major depressive disorder), recurrent episode (HCC) 05/23/2015  . Allergic rhinitis 11/03/2014  . Paresthesia 09/08/2014  . Peripheral neuropathy 07/14/2014  . Morbid obesity due to excess calories (HCC) 07/14/2014  . Pain in limb 07/13/2014  . Abnormality of gait 07/13/2014  . Fibromyalgia 06/13/2014  . Spinal stenosis 01/27/2013  . HTN (hypertension) 11/30/2012    Past Surgical History:  Procedure Laterality Date  . ABDOMINAL HYSTERECTOMY  1985  . CESAREAN SECTION  1976; 1979  . SHOULDER OPEN ROTATOR CUFF REPAIR Left      OB History   None      Home Medications    Prior to Admission medications   Medication Sig Start Date End Date Taking? Authorizing Provider  amitriptyline (ELAVIL) 75 MG tablet Take 1 tablet (75 mg total) by mouth at bedtime. 10/09/17   Kirt Boys, DO  aspirin 81  MG tablet Take 81 mg by mouth daily.    [provider]  azithromycin (ZITHROMAX) 250 MG tablet Take 1 tablet (250 mg total) by mouth daily. Take first 2 tablets together, then 1 every day until finished. 12/25/17   Joy, Shawn C, PA-C  Calcium Carbonate-Vit D-Min (CALCIUM 1200 PO) Take 1 tablet by mouth daily.    [provider]  celecoxib (CELEBREX) 100 MG capsule TAKE 1 CAPSULE TWICE DAILY FOR PAINS 08/04/17   Kirt Boys, DO  Cholecalciferol (VITAMIN D3) 2000 units TABS Take by mouth daily.    [provider]  citalopram (CELEXA) 20 MG tablet Take 1 tablet (20 mg total) by mouth daily. 02/13/17   Kirt Boys, DO  fluticasone [facility]) 50 MCG/ACT nasal spray  Place 2 sprays into both nostrils daily as needed for allergies or rhinitis. 06/05/17   Kirt Boys, DO  gabapentin (NEURONTIN) 400 MG capsule TAKE 3 CAPSULES TWICE DAILY  AND TAKE 2 CAPSULES AT BEDTIME  FOR  NEUROPATHY 08/04/17   Kirt Boys, DO  loratadine (EQ ALLERGY RELIEF) 10 MG tablet Take 1 tablet (10 mg total) by mouth daily. 10/23/17   Kirt Boys, DO  methocarbamol (ROBAXIN) 500 MG tablet Take 1 tablet (500 mg total) by mouth 2 (two) times daily. 10/11/17   Roxy Horseman, PA-C  metoprolol tartrate (LOPRESSOR) 50 MG tablet TAKE ONE TABLET TWICE DAILY FOR BLOOD PRESSURE 01/20/17   Kirt Boys, DO  oxyCODONE (ROXICODONE) 15 MG immediate release tablet Take 1 tablet (15 mg total) by mouth every 4 (four) hours as needed for pain. 12/21/17   Kirt Boys, DO  predniSONE (STERAPRED UNI-PAK 21 TAB) 10 MG (21) TBPK tablet Take 6 tabs (60mg ) on day 1, 5 tabs (50mg ) on day 2, 4 tabs (40mg ) on day 3, 3 tabs (30mg ) on day 4, 2 tabs (20mg ) on day 5, and 1 tab (10mg ) on day 6. 12/25/17   Joy, Shawn C, PA-C  ranitidine (ZANTAC) 150 MG tablet TAKE 1 TABLET (150 MG TOTAL) BY MOUTH AT BEDTIME. 11/11/17   , DO  VENTOLIN HFA 108 (90 Base) MCG/ACT inhaler INHALE 2 PUFFS  EVERY 6 (SIX) HOURS AS NEEDED FOR WHEEZING OR SHORTNESS OF BREATH. 05/13/17   , DO    Family History Family History  Problem Relation Age of Onset  . Dementia Mother   . Heart disease Mother   . Hypertension Mother   . Diabetes Mother   . Diabetes Sister   . Cancer Sister        lung  . Cancer Other        Nepher (Mothers side)  . Diabetes Maternal Grandmother   . Arthritis Maternal Grandmother   . Diabetes Cousin        Mother's side  . Arthritis Sister   . Arthritis Cousin        Mother's side   . Colon cancer Neg Hx   . Esophageal cancer Neg Hx   . Liver cancer Neg Hx   . Pancreatic cancer Neg Hx   . Rectal cancer Neg Hx   . Stomach cancer Neg Hx     Social History Social History    Tobacco Use  . Smoking status: Never Smoker  . Smokeless tobacco: Never Used  Substance Use Topics  . Alcohol use: No    Alcohol/week: 0.0 oz  . Drug use: No     Allergies   Patient has no known allergies.   Review of Systems Review of Systems  Constitutional: Negative for chills, diaphoresis and fever.  Respiratory: Positive for cough. Negative for shortness of breath.   Gastrointestinal: Negative for abdominal pain, diarrhea, nausea and vomiting.  Musculoskeletal: Negative for back pain.       Chest soreness with coughing  Neurological: Negative for dizziness, weakness and numbness.  All other systems reviewed and are negative.    Physical Exam Updated Vital Signs BP 102/70   Pulse 72   Temp 98.3 F (36.8 C)   Resp 20   SpO2 100%   Physical Exam  Constitutional: She appears well-developed and well-nourished. No distress.  HENT:  Head: Normocephalic and atraumatic.  Eyes: Conjunctivae are normal.  Neck: Neck supple.  Cardiovascular: Normal rate, regular rhythm, normal heart sounds and intact distal pulses.  Pulmonary/Chest: Effort normal and breath sounds normal. No respiratory distress.  No increased work of breathing.  Speaks in full sentences without difficulty.    Abdominal: Soft. There is no tenderness. There is no guarding.  Musculoskeletal: She exhibits no edema.  Lymphadenopathy:    She has no cervical adenopathy.  Neurological: She is alert.  Skin: Skin is warm and dry. She is not diaphoretic.  Psychiatric: She has a normal mood and affect. Her behavior is normal.  Nursing note and vitals reviewed.    ED Treatments / Results  Labs (all labs ordered are listed, but only abnormal results are displayed) Labs Reviewed  BASIC METABOLIC PANEL - Abnormal; Notable for the following components:      Result Value   Glucose, Bld 132 (*)    Creatinine, Ser 1.04 (*)    Calcium 8.8 (*)    GFR calc non Af Amer 55 (*)    All other components within  normal limits  CBC - Abnormal; Notable for the following components:   Hemoglobin 11.7 (*)    All other components within normal limits  I-STAT TROPONIN, ED    EKG EKG Interpretation  Date/Time:  Thursday December 24 2017 07:50:38 EDT Ventricular Rate:  80 PR Interval:  160 QRS Duration: 88 QT Interval:  382 QTC Calculation: 440 R Axis:   1 Text Interpretation:  Normal sinus rhythm Minimal voltage criteria for LVH, may be normal variant Nonspecific T wave abnormality Abnormal ECG Confirmed by Vanetta Mulders [phone]) on 12/25/2017 11:39:16 AM   Radiology Dg Chest 2 View  Result Date: 12/25/2017 CLINICAL DATA:  Pt reports productive cough x5 days, thick green sputum. Denies fevers or chills. Pt also endorses chest pain that radiates to her left arm Hx of asthma EXAM: CHEST - 2 VIEW COMPARISON:  10/10/2017 FINDINGS: No focal infiltrate or overt edema. Heart size and mediastinal contours are within normal limits. Aortic Atherosclerosis (ICD10-170.0). No effusion.  No pneumothorax. Visualized bones unremarkable. IMPRESSION: No acute cardiopulmonary disease. Electronically Signed   By: Corlis Leak M.D.   On: 12/25/2017 08:24    Procedures Procedures (including critical care time)  Medications Ordered in ED Medications - No data to display   Initial Impression / Assessment and Plan / ED Course  I have reviewed the triage vital signs and the nursing notes.  Pertinent labs & imaging results that were available during my care of the patient were reviewed by me and considered in my medical decision making (see chart for details).     Patient presents with cough for the past week.  States she has improved, but wanted to get checked out. Patient is nontoxic appearing, afebrile, not tachycardic, not tachypneic, not hypotensive, maintains excellent SPO2 on room  air, and is in no apparent distress.  No acute abnormality on chest x-ray. Single troponin obtained due to duration of symptoms, which  was negative. The patient was given instructions for home care as well as return precautions. Patient voices understanding of these instructions, accepts the plan, and is comfortable with discharge.  Findings and plan of care discussed with Vanetta Mulders, MD. Dr. Deretha Emory personally evaluated and examined this patient.  Vitals:   12/25/17 1015 12/25/17 1030 12/25/17 1045 12/25/17 1100  BP: 119/76 123/80 116/76 117/88  Pulse: 63 64 63 64  Resp:    16  Temp:      SpO2: 100% 98% 99% 99%     Final Clinical Impressions(s) / ED Diagnoses   Final diagnoses:  Cough    ED Discharge Orders        Ordered    predniSONE (STERAPRED UNI-PAK 21 TAB) 10 MG (21) TBPK tablet     12/25/17 1202    azithromycin (ZITHROMAX) 250 MG tablet  Daily     12/25/17 1202       Anselm Pancoast, PA-C 12/25/17 1628    Vanetta Mulders, MD 12/28/17 1845

## 2017-12-25 NOTE — ED Notes (Signed)
Patient transported to X-ray 

## 2017-12-25 NOTE — Discharge Instructions (Signed)
Your symptoms are likely consistent with a viral illness. Viruses do not require or respond to antibiotics. Treatment is symptomatic care and it is important to note that these symptoms may last for 7-14 days.  Viruses do not respond to antibiotics, however, due to your medical history and the duration of symptoms, we will prescribe an antibiotic as a precaution.  If you begin to feel better then you do not need to start taking the antibiotic.  Hand washing: Wash your hands throughout the day, but especially before and after touching the face, using the restroom, sneezing, coughing, or touching surfaces that have been coughed or sneezed upon. Hydration: Symptoms will be intensified and complicated by dehydration. Dehydration can also extend the duration of symptoms. Drink plenty of fluids and get plenty of rest. You should be drinking at least half a liter of water an hour to stay hydrated. Electrolyte drinks (ex. Gatorade, Powerade, Pedialyte) are also encouraged. You should be drinking enough fluids to make your urine light yellow, almost clear. If this is not the case, you are not drinking enough water. Please note that some of the treatments indicated below will not be effective if you are not adequately hydrated. Pain or fever: Tylenol for pain or fever.  Albuterol: May use the albuterol as needed for instances of shortness of breath. Prednisone: Take the prednisone, as directed, in its entirety. Zyrtec or Claritin: May add these medication daily to control underlying symptoms of congestion, sneezing, and other signs of allergies.  These medications are available over-the-counter. Flonase: Use this medication, as directed, for nasal and sinus congestion.  This medication is available over-the-counter. Congestion: Plain Mucinex may help relieve congestion. Saline sinus rinses and saline nasal sprays may also help relieve congestion. Follow up: Follow up with a primary care provider, as needed, for  any future management of this issue.

## 2017-12-25 NOTE — ED Provider Notes (Signed)
Medical screening examination/treatment/procedure(s) were conducted as a shared visit with non-physician practitioner(s) and myself.  I personally evaluated the patient during the encounter.  EKG Interpretation  Date/Time:  Thursday December 24 2017 07:50:38 EDT Ventricular Rate:  80 PR Interval:  160 QRS Duration: 88 QT Interval:  382 QTC Calculation: 440 R Axis:   1 Text Interpretation:  Normal sinus rhythm Minimal voltage criteria for LVH, may be normal variant Nonspecific T wave abnormality Abnormal ECG Confirmed by Vanetta Mulders [phone]) on 12/25/2017 11:39:16 AM  Results for orders placed or performed during the hospital encounter of 12/25/17  Basic metabolic panel  Result Value Ref Range   Sodium 137 135 - 145 mmol/L   Potassium 4.0 3.5 - 5.1 mmol/L   Chloride 104 98 - 111 mmol/L   CO2 24 22 - 32 mmol/L   Glucose, Bld 132 (H) 70 - 99 mg/dL   BUN 12 8 - 23 mg/dL   Creatinine, Ser 8.54 (H) 0.44 - 1.00 mg/dL   Calcium 8.8 (L) 8.9 - 10.3 mg/dL   GFR calc non Af Amer 55 (L) >60 mL/min   GFR calc Af Amer >60 >60 mL/min   Anion gap 9 5 - 15  CBC  Result Value Ref Range   WBC 6.3 4.0 - 10.5 K/uL   RBC 4.31 3.87 - 5.11 MIL/uL   Hemoglobin 11.7 (L) 12.0 - 15.0 g/dL   HCT 62.7 03.5 - 00.9 %   MCV 89.1 78.0 - 100.0 fL   MCH 27.1 26.0 - 34.0 pg   MCHC 30.5 30.0 - 36.0 g/dL   RDW 38.1 82.9 - 93.7 %   Platelets 388 150 - 400 K/uL  I-stat troponin, ED  Result Value Ref Range   Troponin i, poc 0.00 0.00 - 0.08 ng/mL   Comment 3           Dg Chest 2 View  Result Date: 12/25/2017 CLINICAL DATA:  Pt reports productive cough x5 days, thick green sputum. Denies fevers or chills. Pt also endorses chest pain that radiates to her left arm Hx of asthma EXAM: CHEST - 2 VIEW COMPARISON:  10/10/2017 FINDINGS: No focal infiltrate or overt edema. Heart size and mediastinal contours are within normal limits. Aortic Atherosclerosis (ICD10-170.0). No effusion.  No pneumothorax. Visualized bones  unremarkable. IMPRESSION: No acute cardiopulmonary disease. Electronically Signed   By: Corlis Leak M.D.   On: 12/25/2017 08:24   uated the patient during the encounter.  EKG Interpretation  Date/Time:  Thursday December 24 2017 07:50:38 EDT Ventricular Rate:  80 PR Interval:  160 QRS Duration: 88 QT Interval:  382 QTC Calculation: 440 R Axis:   1 Text Interpretation:  Normal sinus rhythm Minimal voltage criteria for LVH, may be normal variant Nonspecific T wave abnormality Abnormal ECG Confirmed by Vanetta Mulders [phone]) on 12/25/2017 11:39:16 AM  Patient's mood symptoms more consistent with upper respiratory infection or bronchitis.  X-ray negative for pneumonia.  Patient has had some disc comfort in the chest associated with the coughing symptoms been ongoing for over a week.  Troponin negative EKG without any acute changes.  Patient stable for discharge home.  Patient nontoxic no acute distress.  Oxygen saturations are in the upper 90s on room air   Vanetta Mulders, MD 12/25/17 1150

## 2017-12-25 NOTE — ED Triage Notes (Addendum)
Pt reports productive cough x5 days, thick green sputum. Denies fevers or chills. Pt also endorses chest pain that radiates to her left arm

## 2017-12-25 NOTE — ED Notes (Signed)
Patient verbalizes understanding of discharge instructions. Opportunity for questioning and answers were provided. Armband removed by staff, pt discharged from ED.  

## 2017-12-30 ENCOUNTER — Other Ambulatory Visit: Payer: Self-pay | Admitting: Internal Medicine

## 2017-12-30 DIAGNOSIS — F332 Major depressive disorder, recurrent severe without psychotic features: Secondary | ICD-10-CM

## 2017-12-30 DIAGNOSIS — G609 Hereditary and idiopathic neuropathy, unspecified: Secondary | ICD-10-CM

## 2018-01-04 ENCOUNTER — Telehealth: Payer: Self-pay

## 2018-01-04 NOTE — Telephone Encounter (Signed)
Recommend urgent care eval or go to the ER as you could have something serious going on

## 2018-01-04 NOTE — Telephone Encounter (Signed)
Message left on clinical intake voicemail:   Patient requested return call, patient states I need an appointment to see Dr.Carter or someone this week  I reviewed schedule prior to calling patient, no available appointments x 2 weeks. Patient states she needs an evaluation of balance and gait. Patient is using assisted device (walker) and still with 20 falls or more in the last 30 days. Patient states " I am scared."  Patient would also like rx for wheelchair for decreased mobility and hip pain. Patient states " I might need hip replacement."   Patient informed no available appointment's, message will be forwarded to Dr.Carter to advise

## 2018-01-05 NOTE — Telephone Encounter (Signed)
I left a message for patient to call the office 

## 2018-01-05 NOTE — Telephone Encounter (Signed)
I spoke with patient and she stated that she might go over to urgent care this afternoon or in the morning.

## 2018-01-10 ENCOUNTER — Other Ambulatory Visit: Payer: Self-pay | Admitting: Internal Medicine

## 2018-01-10 DIAGNOSIS — G4700 Insomnia, unspecified: Secondary | ICD-10-CM

## 2018-01-10 DIAGNOSIS — M797 Fibromyalgia: Secondary | ICD-10-CM

## 2018-01-10 DIAGNOSIS — F332 Major depressive disorder, recurrent severe without psychotic features: Secondary | ICD-10-CM

## 2018-01-19 ENCOUNTER — Other Ambulatory Visit: Payer: Self-pay | Admitting: *Deleted

## 2018-01-19 DIAGNOSIS — G609 Hereditary and idiopathic neuropathy, unspecified: Secondary | ICD-10-CM

## 2018-01-19 DIAGNOSIS — M5430 Sciatica, unspecified side: Secondary | ICD-10-CM

## 2018-01-19 DIAGNOSIS — G894 Chronic pain syndrome: Secondary | ICD-10-CM

## 2018-01-19 DIAGNOSIS — M4800 Spinal stenosis, site unspecified: Secondary | ICD-10-CM

## 2018-01-19 DIAGNOSIS — M797 Fibromyalgia: Secondary | ICD-10-CM

## 2018-01-19 MED ORDER — OXYCODONE HCL 15 MG PO TABS: 15.0000 mg | ORAL_TABLET | ORAL | 0 refills | Status: DC | PRN

## 2018-01-19 NOTE — Telephone Encounter (Signed)
Patient requested Refill [place]CC[place] Database Verified LR: 12/21/2017 Pended Rx and sent to Dr.Reed for approval. (Dr. Montez Morita Patient)

## 2018-02-17 ENCOUNTER — Other Ambulatory Visit: Payer: Self-pay | Admitting: *Deleted

## 2018-02-17 ENCOUNTER — Encounter: Payer: Self-pay | Admitting: Internal Medicine

## 2018-02-17 DIAGNOSIS — M5430 Sciatica, unspecified side: Secondary | ICD-10-CM

## 2018-02-17 DIAGNOSIS — M797 Fibromyalgia: Secondary | ICD-10-CM

## 2018-02-17 DIAGNOSIS — M4800 Spinal stenosis, site unspecified: Secondary | ICD-10-CM

## 2018-02-17 DIAGNOSIS — G894 Chronic pain syndrome: Secondary | ICD-10-CM

## 2018-02-17 DIAGNOSIS — G609 Hereditary and idiopathic neuropathy, unspecified: Secondary | ICD-10-CM

## 2018-02-17 MED ORDER — OXYCODONE HCL 15 MG PO TABS: 15.0000 mg | ORAL_TABLET | ORAL | 0 refills | Status: DC | PRN

## 2018-02-17 NOTE — Telephone Encounter (Signed)
She was due to f/u in June 2019; ok to schedule f/u appt

## 2018-02-17 NOTE — Telephone Encounter (Signed)
Patient requested refill. [place]CCSRS Database Verified LR: 01/19/2018 Pharmacy Confirmed Pended Rx and sent to Dr. Montez Morita for approval.   Patient is also requesting an appointment with Dr. Montez Morita, stated nothing is wrong but she just wants to follow up. No available appointment. Is it ok to open up an appointment slot or schedule her with someone else? Please Advise.

## 2018-02-17 NOTE — Telephone Encounter (Signed)
Pharmacy didn't change in system and sent to [facility]. Corrected Pharmacy and resent for approval.

## 2018-02-24 ENCOUNTER — Ambulatory Visit: Payer: Medicare HMO | Admitting: Internal Medicine

## 2018-03-02 ENCOUNTER — Emergency Department (HOSPITAL_COMMUNITY)
Admission: EM | Admit: 2018-03-02 | Discharge: 2018-03-03 | Disposition: A | Discharge status: Home / Self Care | Payer: Medicare HMO | Attending: Emergency Medicine | Admitting: Emergency Medicine

## 2018-03-02 ENCOUNTER — Encounter (HOSPITAL_COMMUNITY): Payer: Self-pay | Admitting: Emergency Medicine

## 2018-03-02 DIAGNOSIS — I110 Hypertensive heart disease with heart failure: Secondary | ICD-10-CM | POA: Insufficient documentation

## 2018-03-02 DIAGNOSIS — Z79899 Other long term (current) drug therapy: Secondary | ICD-10-CM | POA: Diagnosis not present

## 2018-03-02 DIAGNOSIS — Z7982 Long term (current) use of aspirin: Secondary | ICD-10-CM | POA: Insufficient documentation

## 2018-03-02 DIAGNOSIS — R0789 Other chest pain: Secondary | ICD-10-CM | POA: Diagnosis not present

## 2018-03-02 DIAGNOSIS — I959 Hypotension, unspecified: Secondary | ICD-10-CM | POA: Diagnosis not present

## 2018-03-02 DIAGNOSIS — I509 Heart failure, unspecified: Secondary | ICD-10-CM | POA: Diagnosis not present

## 2018-03-02 DIAGNOSIS — R079 Chest pain, unspecified: Secondary | ICD-10-CM | POA: Diagnosis not present

## 2018-03-02 NOTE — ED Triage Notes (Signed)
Per EMS pt from home had sudden sharp upper L chest with radiation to L neck and L arm. Pain started 1 hr ago. 324 mg aspirin, 1 nitro no relief pta. No cardiac h/s

## 2018-03-03 ENCOUNTER — Emergency Department (HOSPITAL_COMMUNITY): Payer: Medicare HMO

## 2018-03-03 DIAGNOSIS — R079 Chest pain, unspecified: Secondary | ICD-10-CM | POA: Diagnosis not present

## 2018-03-03 LAB — BASIC METABOLIC PANEL
Anion gap: 10 (ref 5–15)
BUN: 12 mg/dL (ref 8–23)
CO2: 25 mmol/L (ref 22–32)
Calcium: 8.5 mg/dL — ABNORMAL LOW (ref 8.9–10.3)
Chloride: 102 mmol/L (ref 98–111)
Creatinine, Ser: 1.08 mg/dL — ABNORMAL HIGH (ref 0.44–1.00)
GFR calc Af Amer: >60 mL/min (ref >60)
GFR calc non Af Amer: 52 mL/min — ABNORMAL LOW (ref >60)
Glucose, Bld: 95 mg/dL (ref 70–99)
Potassium: 5 mmol/L (ref 3.5–5.1)
Sodium: 137 mmol/L (ref 135–145)

## 2018-03-03 LAB — CBC
HCT: 37.6 % (ref 36.0–46.0)
Hemoglobin: 11.6 g/dL — ABNORMAL LOW (ref 12.0–15.0)
MCH: 27 pg (ref 26.0–34.0)
MCHC: 30.9 g/dL (ref 30.0–36.0)
MCV: 87.6 fL (ref 78.0–100.0)
Platelets: 203 10*3/uL (ref 150–400)
RBC: 4.29 MIL/uL (ref 3.87–5.11)
RDW: 13.2 % (ref 11.5–15.5)
WBC: 7.1 10*3/uL (ref 4.0–10.5)

## 2018-03-03 LAB — I-STAT TROPONIN, ED
Troponin i, poc: 0 ng/mL (ref 0.00–0.08)
Troponin i, poc: 0.02 ng/mL (ref 0.00–0.08)

## 2018-03-03 LAB — D-DIMER, QUANTITATIVE (NOT AT ARMC): D-Dimer, Quant: 0.52 ug/mL-FEU — ABNORMAL HIGH (ref 0.00–0.50)

## 2018-03-03 MED ORDER — MORPHINE SULFATE (PF) 4 MG/ML IV SOLN
4.0000 mg | Freq: Once | INTRAVENOUS | Status: AC
  Administered 2018-03-03: 4 mg via INTRAVENOUS
  Filled 2018-03-03: qty 1

## 2018-03-03 NOTE — Discharge Instructions (Addendum)
You were seen today for chest pain.  Your work-up is largely reassuring.  Your screening test for heart disease and blood clots are negative.  Follow-up closely with your primary doctor and cardiology.

## 2018-03-03 NOTE — ED Provider Notes (Signed)
[facility] EMERGENCY DEPARTMENT Provider Note   CSN: [id number] Arrival date & time: 03/02/18  2334     History   Chief Complaint No chief complaint on file.   HPI Melinda [name] is a 67 y.o. female.  HPI  This is a 67 year old female with a history of fibromyalgia, hypertension who presents with chest pain.  Patient reports that she was lying in bed when she had sudden onset of left-sided sharp chest pain that radiated into her left neck and left arm.  Pain started 1 hour ago.  Does not seem to be better or worse with exertion of breathing.  No recent fevers or cough.  Patient took a full dose aspirin and nitroglycerin with no relief.  Currently she rates her pain at 8 out of 10.  Denies any leg swelling or history of blood clots.  Denies any nausea, vomiting, associated shortness of breath.  She has never had pain like this in the past.  Denies history of heart disease.  Past Medical History:  Diagnosis Date  . Allergy   . Anxiety   . Arthritis    "99% of my body" (11/11/2016)  . Asthma   . Bursitis of both hips   . Cataract   . Cervical scoliosis   . Chronic back pain    "all over my back" (11/11/2016)  . Depressive disorder, not elsewhere classified   . Enthesopathy of hip region   . Environmental allergies    "I take Claritin qd; 365 days/year" (11/11/2016)  . Essential hypertension, benign   . Fibromyalgia   . Frequent falls   . GERD (gastroesophageal reflux disease)   . Headache    "at least 1/wk; may last for 2 days or so" (11/11/2016)  . History of blood transfusion 1985   w/hysterectomy  . Lumbar stenosis   . Migraine    "a few/month" (11/11/2016)  . Muscle weakness (generalized)   . Myalgia and myositis, unspecified   . Osteoporosis   . Other abnormal blood chemistry   . Other malaise and fatigue   . Raynaud's syndrome   . Sciatic nerve pain, left   . Sciatica   . Unspecified vitamin D deficiency     Patient Active Problem List   Diagnosis Date Noted  . Prediabetes 09/12/2017  . High risk medication use 09/12/2017  . Frequent falls 09/12/2017  . Chronic pain syndrome 08/14/2017  . Gastroesophageal reflux disease 08/14/2017  . Cough variant asthma 03/14/2017  . Pulmonary hypertension (HCC) 03/12/2017  . Chest pain 11/11/2016  . Asthma 11/11/2016  . Atypical chest pain   . Costochondritis   . Chronic congestive heart failure (HCC)   . MDD (major depressive disorder), recurrent episode (HCC) 05/23/2015  . Allergic rhinitis 11/03/2014  . Paresthesia 09/08/2014  . Peripheral neuropathy 07/14/2014  . Morbid obesity due to excess calories (HCC) 07/14/2014  . Pain in limb 07/13/2014  . Abnormality of gait 07/13/2014  . Fibromyalgia 06/13/2014  . Spinal stenosis 01/27/2013  . HTN (hypertension) 11/30/2012    Past Surgical History:  Procedure Laterality Date  . ABDOMINAL HYSTERECTOMY  1985  . CESAREAN SECTION  1976; 1979  . SHOULDER OPEN ROTATOR CUFF REPAIR Left      OB History   None      Home Medications    Prior to Admission medications   Medication Sig Start Date End Date Taking? Authorizing Provider  amitriptyline (ELAVIL) 75 MG tablet TAKE 1 TABLET (75 MG TOTAL) BY MOUTH AT BEDTIME.  01/11/18   Kirt Boys, DO  aspirin 81 MG tablet Take 81 mg by mouth daily.    [provider]  azithromycin (ZITHROMAX) 250 MG tablet Take 1 tablet (250 mg total) by mouth daily. Take first 2 tablets together, then 1 every day until finished. 12/25/17   Joy, Shawn C, PA-C  Calcium Carbonate-Vit D-Min (CALCIUM 1200 PO) Take 1 tablet by mouth daily.    [provider]  celecoxib (CELEBREX) 100 MG capsule TAKE 1 CAPSULE TWICE DAILY  FOR  PAIN 12/30/17   Kirt Boys, DO  Cholecalciferol (VITAMIN D3) 2000 units TABS Take by mouth daily.    [provider]  citalopram (CELEXA) 20 MG tablet TAKE 1 TABLET (20 MG TOTAL) BY MOUTH DAILY. 12/30/17   Kirt Boys, DO  fluticasone [facility]) 50 MCG/ACT  nasal spray Place 2 sprays into both nostrils daily as needed for allergies or rhinitis. 06/05/17   Kirt Boys, DO  gabapentin (NEURONTIN) 400 MG capsule TAKE 3 CAPSULES TWICE DAILY  AND TAKE 2 CAPSULES AT BEDTIME  FOR  NEUROPATHY 12/30/17   Kirt Boys, DO  loratadine (EQ ALLERGY RELIEF) 10 MG tablet Take 1 tablet (10 mg total) by mouth daily. 10/23/17   Kirt Boys, DO  methocarbamol (ROBAXIN) 500 MG tablet Take 1 tablet (500 mg total) by mouth 2 (two) times daily. 10/11/17   Roxy Horseman, PA-C  metoprolol tartrate (LOPRESSOR) 50 MG tablet TAKE ONE TABLET TWICE DAILY FOR BLOOD PRESSURE 12/30/17   Kirt Boys, DO  oxyCODONE (ROXICODONE) 15 MG immediate release tablet Take 1 tablet (15 mg total) by mouth every 4 (four) hours as needed for pain. 02/17/18   Kirt Boys, DO  predniSONE (STERAPRED UNI-PAK 21 TAB) 10 MG (21) TBPK tablet Take 6 tabs (60mg ) on day 1, 5 tabs (50mg ) on day 2, 4 tabs (40mg ) on day 3, 3 tabs (30mg ) on day 4, 2 tabs (20mg ) on day 5, and 1 tab (10mg ) on day 6. 12/25/17   Joy, Shawn C, PA-C  ranitidine (ZANTAC) 150 MG tablet TAKE 1 TABLET (150 MG TOTAL) BY MOUTH AT BEDTIME. 11/11/17   , DO  VENTOLIN HFA 108 (90 Base) MCG/ACT inhaler INHALE 2 PUFFS  EVERY 6 (SIX) HOURS AS NEEDED FOR WHEEZING OR SHORTNESS OF BREATH. 05/13/17   , DO    Family History Family History  Problem Relation Age of Onset  . Dementia Mother   . Heart disease Mother   . Hypertension Mother   . Diabetes Mother   . Diabetes Sister   . Cancer Sister        lung  . Cancer Other        Nepher (Mothers side)  . Diabetes Maternal Grandmother   . Arthritis Maternal Grandmother   . Diabetes Cousin        Mother's side  . Arthritis Sister   . Arthritis Cousin        Mother's side   . Colon cancer Neg Hx   . Esophageal cancer Neg Hx   . Liver cancer Neg Hx   . Pancreatic cancer Neg Hx   . Rectal cancer Neg Hx   . Stomach cancer Neg Hx     Social History Social  History   Tobacco Use  . Smoking status: Never Smoker  . Smokeless tobacco: Never Used  Substance Use Topics  . Alcohol use: No    Alcohol/week: 0.0 standard drinks  . Drug use: No     Allergies   Patient has  no known allergies.   Review of Systems Review of Systems  Constitutional: Negative for fever.  Respiratory: Negative for shortness of breath.   Cardiovascular: Positive for chest pain. Negative for leg swelling.  Gastrointestinal: Negative for abdominal pain, nausea and vomiting.  Genitourinary: Negative for dysuria.  Neurological: Negative for weakness and numbness.  All other systems reviewed and are negative.    Physical Exam Updated Vital Signs BP 108/85   Pulse 61   Temp 97.9 F (36.6 C) (Oral)   Resp 10   Ht 1.626 m (5\' 4" )   Wt 102 kg   SpO2 97%   BMI 38.60 kg/m   Physical Exam  Constitutional: She is oriented to person, place, and time. She appears well-developed and well-nourished. No distress.  HENT:  Head: Normocephalic and atraumatic.  Eyes: Pupils are equal, round, and reactive to light.  Neck: Neck supple.  Cardiovascular: Normal rate, regular rhythm and normal heart sounds.  No murmur heard. Pulmonary/Chest: Effort normal and breath sounds normal. No respiratory distress. She has no wheezes.  Abdominal: Soft. Bowel sounds are normal.  Musculoskeletal: She exhibits edema.  1+ bilateral lower extremity edema, pitting, no calf tenderness  Neurological: She is alert and oriented to person, place, and time.  Skin: Skin is warm and dry.  Psychiatric: She has a normal mood and affect.  Nursing note and vitals reviewed.    ED Treatments / Results  Labs (all labs ordered are listed, but only abnormal results are displayed) Labs Reviewed  BASIC METABOLIC PANEL - Abnormal; Notable for the following components:      Result Value   Creatinine, Ser 1.08 (*)    Calcium 8.5 (*)    GFR calc non Af Amer 52 (*)    All other components within  normal limits  CBC - Abnormal; Notable for the following components:   Hemoglobin 11.6 (*)    All other components within normal limits  D-DIMER, QUANTITATIVE (NOT AT [facility]) - Abnormal; Notable for the following components:   D-Dimer, Quant 0.52 (*)    All other components within normal limits  I-STAT TROPONIN, ED  I-STAT TROPONIN, ED    EKG EKG Interpretation  Date/Time:  Tuesday March 02 2018 23:36:42 EDT Ventricular Rate:  66 PR Interval:    QRS Duration: 109 QT Interval:  428 QTC Calculation: 449 R Axis:   30 Text Interpretation:  Sinus rhythm Low voltage, precordial leads Confirmed by Ross Marcus ([id number]) on 03/03/2018 12:04:29 AM   Radiology Dg Chest 2 View  Result Date: 03/03/2018 CLINICAL DATA:  Sudden sharp upper left chest pain radiating to the left neck and left arm. EXAM: CHEST - 2 VIEW COMPARISON:  12/25/2017 FINDINGS: Shallow inspiration with elevation of the left hemidiaphragm. Heart size and pulmonary vascularity appear normal. No airspace disease or consolidation in the lungs. No blunting of costophrenic angles. No pneumothorax. Mediastinal contours appear intact. Calcification of the aorta. IMPRESSION: No active cardiopulmonary disease. Electronically Signed   By: Burman Nieves M.D.   On: 03/03/2018 00:43    Procedures Procedures (including critical care time)  Medications Ordered in ED Medications  morphine 4 MG/ML injection 4 mg (4 mg Intravenous Given 03/03/18 0055)     Initial Impression / Assessment and Plan / ED Course  I have reviewed the triage vital signs and the nursing notes.  Pertinent labs & imaging results that were available during my care of the patient were reviewed by me and considered in my medical decision making (see chart for  details).     Patient presents with chest pain.  She is overall nontoxic-appearing on exam.  Vital signs reassuring.  Fairly low risk for heart disease.  Story is somewhat atypical.  Initial EKG without  evidence of ischemia.  Troponin x2-.  D-dimer 0.52.  With age-adjusted recommendations, this would be negative.  Low suspicion for PE.  Chest x-ray shows no evidence of pneumothorax, pneumonia, edema.  On multiple repeat evaluations, patient is resting comfortably.  Recommend follow-up with cardiology  After history, exam, and medical workup I feel the patient has been appropriately medically screened and is safe for discharge home. Pertinent diagnoses were discussed with the patient. Patient was given return precautions.   Final Clinical Impressions(s) / ED Diagnoses   Final diagnoses:  Atypical chest pain    ED Discharge Orders    None       Shon Baton, MD 03/03/18 [phone]

## 2018-03-13 ENCOUNTER — Other Ambulatory Visit: Payer: Self-pay | Admitting: Internal Medicine

## 2018-03-13 DIAGNOSIS — F332 Major depressive disorder, recurrent severe without psychotic features: Secondary | ICD-10-CM

## 2018-03-13 DIAGNOSIS — G4700 Insomnia, unspecified: Secondary | ICD-10-CM

## 2018-03-13 DIAGNOSIS — M797 Fibromyalgia: Secondary | ICD-10-CM

## 2018-03-23 ENCOUNTER — Other Ambulatory Visit: Payer: Self-pay | Admitting: *Deleted

## 2018-03-23 DIAGNOSIS — G894 Chronic pain syndrome: Secondary | ICD-10-CM

## 2018-03-23 DIAGNOSIS — M5430 Sciatica, unspecified side: Secondary | ICD-10-CM

## 2018-03-23 DIAGNOSIS — G609 Hereditary and idiopathic neuropathy, unspecified: Secondary | ICD-10-CM

## 2018-03-23 DIAGNOSIS — M4800 Spinal stenosis, site unspecified: Secondary | ICD-10-CM

## 2018-03-23 DIAGNOSIS — M797 Fibromyalgia: Secondary | ICD-10-CM

## 2018-03-23 MED ORDER — OXYCODONE HCL 15 MG PO TABS: 15.0000 mg | ORAL_TABLET | ORAL | 0 refills | Status: DC | PRN

## 2018-03-23 NOTE — Telephone Encounter (Signed)
Patient requested refill.  NCCSRS Database Verified LR: 02/17/2018 Pharmacy Confirmed Pended Rx and sent to Dr. Carter for approval.  

## 2018-03-24 ENCOUNTER — Ambulatory Visit (INDEPENDENT_AMBULATORY_CARE_PROVIDER_SITE_OTHER): Payer: Medicare HMO | Admitting: Internal Medicine

## 2018-03-24 ENCOUNTER — Encounter: Payer: Self-pay | Admitting: Internal Medicine

## 2018-03-24 VITALS — BP 115/60 | HR 67 | Temp 97.9°F | Ht 64.0 in | Wt 242.0 lb

## 2018-03-24 DIAGNOSIS — R296 Repeated falls: Secondary | ICD-10-CM

## 2018-03-24 DIAGNOSIS — Z23 Encounter for immunization: Secondary | ICD-10-CM

## 2018-03-24 DIAGNOSIS — M797 Fibromyalgia: Secondary | ICD-10-CM | POA: Diagnosis not present

## 2018-03-24 DIAGNOSIS — R7303 Prediabetes: Secondary | ICD-10-CM

## 2018-03-24 DIAGNOSIS — J45991 Cough variant asthma: Secondary | ICD-10-CM | POA: Diagnosis not present

## 2018-03-24 DIAGNOSIS — G894 Chronic pain syndrome: Secondary | ICD-10-CM | POA: Diagnosis not present

## 2018-03-24 DIAGNOSIS — K219 Gastro-esophageal reflux disease without esophagitis: Secondary | ICD-10-CM

## 2018-03-24 DIAGNOSIS — G4700 Insomnia, unspecified: Secondary | ICD-10-CM

## 2018-03-24 DIAGNOSIS — M4807 Spinal stenosis, lumbosacral region: Secondary | ICD-10-CM

## 2018-03-24 DIAGNOSIS — R2681 Unsteadiness on feet: Secondary | ICD-10-CM

## 2018-03-24 DIAGNOSIS — I10 Essential (primary) hypertension: Secondary | ICD-10-CM | POA: Diagnosis not present

## 2018-03-24 DIAGNOSIS — J302 Other seasonal allergic rhinitis: Secondary | ICD-10-CM | POA: Diagnosis not present

## 2018-03-24 DIAGNOSIS — F332 Major depressive disorder, recurrent severe without psychotic features: Secondary | ICD-10-CM | POA: Diagnosis not present

## 2018-03-24 DIAGNOSIS — Z6841 Body Mass Index (BMI) 40.0 and over, adult: Secondary | ICD-10-CM | POA: Diagnosis not present

## 2018-03-24 MED ORDER — AMITRIPTYLINE HCL 75 MG PO TABS: 75.0000 mg | ORAL_TABLET | Freq: Every day | ORAL | 1 refills | Status: DC

## 2018-03-24 MED ORDER — METOPROLOL TARTRATE 50 MG PO TABS: ORAL_TABLET | ORAL | 1 refills | Status: DC

## 2018-03-24 MED ORDER — LORATADINE 10 MG PO TABS: 10.0000 mg | ORAL_TABLET | Freq: Every day | ORAL | 1 refills | Status: DC

## 2018-03-24 MED ORDER — RANITIDINE HCL 150 MG PO TABS: 150.0000 mg | ORAL_TABLET | Freq: Every day | ORAL | 1 refills | Status: DC

## 2018-03-24 MED ORDER — FLUTICASONE PROPIONATE 50 MCG/ACT NA SUSP: 2.0000 | Freq: Every day | NASAL | 6 refills | Status: DC | PRN

## 2018-03-24 MED ORDER — METHOCARBAMOL 500 MG PO TABS: 500.0000 mg | ORAL_TABLET | Freq: Two times a day (BID) | ORAL | 1 refills | Status: DC

## 2018-03-24 MED ORDER — ALBUTEROL SULFATE HFA 108 (90 BASE) MCG/ACT IN AERS: INHALATION_SPRAY | RESPIRATORY_TRACT | 3 refills | Status: DC

## 2018-03-24 NOTE — Patient Instructions (Addendum)
Continue current medications as ordered  Follow up with specialists as scheduled  Flu shot given today  Script for motorized wheelchair written today  Follow up in 3 mos with Shanda Bumps for routine visit.

## 2018-03-24 NOTE — Progress Notes (Signed)
Patient ID: Melinda [name], female   DOB: December 07, 1950, 67 y.o.   MRN: [id number]   Location:  [facility] OFFICE  Provider: DR Elmon Kirschner  Code Status:  Goals of Care:  Advanced Directives 03/24/2018  Does Patient Have a Medical Advance Directive? No  Type of Advance Directive -  Does patient want to make changes to medical advance directive? -  Copy of Healthcare Power of Attorney in Chart? -  Would patient like information on creating a medical advance directive? No - Patient declined     Chief Complaint  Patient presents with  . Medication Management    60 minute visit , patient had a lot of trouble ambulating with walker and look to be in pain, severe hip and knee problems, would like to get a wheel chair  . Immunizations    Patient declined flu vaccine     HPI: Patient is a 67 y.o. female seen today for medical management of chronic diseases.  She was seen in the ED in 11/2017 for cough and again in 02/2018 for atypical CP - both w/u neg for acute process. Hgb 11.6; Cr 1.08. She now lives in an handicap accessible apt but states she has difficulty walking. She falls frequently. She is interested in motorized w/c. She is unable to use manuel w/c due to poor upper body strength.   FMS/chronic pain/right sciatica/neuropathy - pain overall uncontrolled on percocet, Robaxin, celebrex, and neurontin.  She was followed by neurology in the past. She has intermittent tingling and takes neurontin and doxepin. Insurance no longer covers doxepin and she has not taken it in several weeks. Her walker brakes do not work as well. She has fallen multiple times since her last OV  Depression - mood stable on citalopram. She is estranged from her family. Denies SI/HI. She has feelings of worthlessness. Currently takes amitriptyline. She gets parietal HA  HTN - BP stable on metoprolol  Prediabetes - diet controlled. She avoids complex CHO and maintains healthy diet. A1c 5.4%  Obesity - weight up 2 lbs  since last OV. Gait is unsteady and she has poor exercise tolerance  Pulmonary HTN - has seen pulm Dr Sherene Sires in the past but stopped going; PA pressure 46 mm Hg in July 2018  Hx atypical CP - has been worked up in the past; stress test neg   Past Medical History:  Diagnosis Date  . Allergy   . Anxiety   . Arthritis    "99% of my body" (11/11/2016)  . Asthma   . Bursitis of both hips   . Cataract   . Cervical scoliosis   . Chronic back pain    "all over my back" (11/11/2016)  . Depressive disorder, not elsewhere classified   . Enthesopathy of hip region   . Environmental allergies    "I take Claritin qd; 365 days/year" (11/11/2016)  . Essential hypertension, benign   . Fibromyalgia   . Frequent falls   . GERD (gastroesophageal reflux disease)   . Headache    "at least 1/wk; may last for 2 days or so" (11/11/2016)  . History of blood transfusion 1985   w/hysterectomy  . Lumbar stenosis   . Migraine    "a few/month" (11/11/2016)  . Muscle weakness (generalized)   . Myalgia and myositis, unspecified   . Osteoporosis   . Other abnormal blood chemistry   . Other malaise and fatigue   . Raynaud's syndrome   . Sciatic nerve pain, left   .  Sciatica   . Unspecified vitamin D deficiency     Past Surgical History:  Procedure Laterality Date  . ABDOMINAL HYSTERECTOMY  1985  . CESAREAN SECTION  1976; 1979  . SHOULDER OPEN ROTATOR CUFF REPAIR Left      reports that she has never smoked. She has never used smokeless tobacco. She reports that she does not drink alcohol or use drugs. Social History   Socioeconomic History  . Marital status: Divorced    Spouse name: Not on file  . Number of children: 3  . Years of education: 53  . Highest education level: Not on file  Occupational History    Comment: retired  Engineer, production  . Financial resource strain: Not hard at all  . Food insecurity:    Worry: Never true    Inability: Never true  . Transportation needs:    Medical: No     Non-medical: No  Tobacco Use  . Smoking status: Never Smoker  . Smokeless tobacco: Never Used  Substance and Sexual Activity  . Alcohol use: No    Alcohol/week: 0.0 standard drinks  . Drug use: No  . Sexual activity: Yes  Lifestyle  . Physical activity:    Days per week: 0 days    Minutes per session: 0 min  . Stress: Rather much  Relationships  . Social connections:    Talks on phone: More than three times a week    Gets together: More than three times a week    Attends religious service: Never    Active member of club or organization: No    Attends meetings of clubs or organizations: Never    Relationship status: Widowed  . Intimate partner violence:    Fear of current or ex partner: No    Emotionally abused: No    Physically abused: No    Forced sexual activity: No  Other Topics Concern  . Not on file  Social History Narrative   Patient lives with her grandson. Patient is retired.   Education some college.   Right handed.    Caffeine two cups daily.    Family History  Problem Relation Age of Onset  . Dementia Mother   . Heart disease Mother   . Hypertension Mother   . Diabetes Mother   . Diabetes Sister   . Cancer Sister        lung  . Cancer Other        Nepher (Mothers side)  . Diabetes Maternal Grandmother   . Arthritis Maternal Grandmother   . Diabetes Cousin        Mother's side  . Arthritis Sister   . Arthritis Cousin        Mother's side   . Colon cancer Neg Hx   . Esophageal cancer Neg Hx   . Liver cancer Neg Hx   . Pancreatic cancer Neg Hx   . Rectal cancer Neg Hx   . Stomach cancer Neg Hx     No Known Allergies  Outpatient Encounter Medications as of 03/24/2018  Medication Sig  . amitriptyline (ELAVIL) 75 MG tablet TAKE 1 TABLET (75 MG TOTAL) BY MOUTH AT BEDTIME.  Marland Kitchen aspirin 81 MG tablet Take 81 mg by mouth daily.  . Calcium Carbonate-Vit D-Min (CALCIUM 1200 PO) Take 1 tablet by mouth daily.  . celecoxib (CELEBREX) 100 MG capsule TAKE 1  CAPSULE TWICE DAILY  FOR  PAIN  . Cholecalciferol (VITAMIN D3) 2000 units TABS Take by mouth daily.  Marland Kitchen  citalopram (CELEXA) 20 MG tablet TAKE 1 TABLET (20 MG TOTAL) BY MOUTH DAILY.  . fluticasone (FLONASE) 50 MCG/ACT nasal spray Place 2 sprays into both nostrils daily as needed for allergies or rhinitis.  Marland Kitchen gabapentin (NEURONTIN) 400 MG capsule TAKE 3 CAPSULES TWICE DAILY  AND TAKE 2 CAPSULES AT BEDTIME  FOR  NEUROPATHY  . loratadine (EQ ALLERGY RELIEF) 10 MG tablet Take 1 tablet (10 mg total) by mouth daily.  . methocarbamol (ROBAXIN) 500 MG tablet Take 1 tablet (500 mg total) by mouth 2 (two) times daily.  . metoprolol tartrate (LOPRESSOR) 50 MG tablet TAKE ONE TABLET TWICE DAILY FOR BLOOD PRESSURE  . oxyCODONE (ROXICODONE) 15 MG immediate release tablet Take 1 tablet (15 mg total) by mouth every 4 (four) hours as needed for pain.  . ranitidine (ZANTAC) 150 MG tablet TAKE 1 TABLET (150 MG TOTAL) BY MOUTH AT BEDTIME.  . VENTOLIN HFA 108 (90 Base) MCG/ACT inhaler INHALE 2 PUFFS  EVERY 6 (SIX) HOURS AS NEEDED FOR WHEEZING OR SHORTNESS OF BREATH.  . [DISCONTINUED] azithromycin (ZITHROMAX) 250 MG tablet Take 1 tablet (250 mg total) by mouth daily. Take first 2 tablets together, then 1 every day until finished.  . [DISCONTINUED] predniSONE (STERAPRED UNI-PAK 21 TAB) 10 MG (21) TBPK tablet Take 6 tabs (60mg ) on day 1, 5 tabs (50mg ) on day 2, 4 tabs (40mg ) on day 3, 3 tabs (30mg ) on day 4, 2 tabs (20mg ) on day 5, and 1 tab (10mg ) on day 6.   No facility-administered encounter medications on file as of 03/24/2018.     Review of Systems:  Review of Systems  Cardiovascular: Positive for chest pain and leg swelling.  Musculoskeletal: Positive for arthralgias, back pain, gait problem, joint swelling and myalgias.  Neurological: Positive for weakness.  All other systems reviewed and are negative.   Health Maintenance  Topic Date Due  . COLONOSCOPY  10/18/2001  . INFLUENZA VACCINE  05/14/2018  (Originally 01/28/2018)  . MAMMOGRAM  09/04/2019  . TETANUS/TDAP  01/25/2026  . DEXA SCAN  Completed  . Hepatitis C Screening  Completed  . PNA vac Low Risk Adult  Completed    Physical Exam: Vitals:   03/24/18 1246  Weight: 242 lb (109.8 kg)  Height: 5\' 4"  (1.626 m)   Body mass index is 41.54 kg/m. Physical Exam  Constitutional: She is oriented to person, place, and time. She appears well-developed and well-nourished.    HENT:  Mouth/Throat: Oropharynx is clear and moist. No oropharyngeal exudate.  MMM; no oral thrush  Eyes: Pupils are equal, round, and reactive to light. No scleral icterus.  Neck: Neck supple. Carotid bruit is not present. No tracheal deviation present. No thyromegaly present.  Cardiovascular: Normal rate, regular rhythm and intact distal pulses. Exam reveals no gallop and no friction rub.  Murmur (1/6 SEM) heard. +1 b/l LE edema pitting. no calf TTP.   Pulmonary/Chest: Effort normal and breath sounds normal. No stridor. No respiratory distress. She has no wheezes. She has no rales.  Abdominal: Soft. Normal appearance and bowel sounds are normal. She exhibits no distension and no mass. There is no hepatomegaly. There is tenderness (epigastric). There is no rigidity, no rebound and no guarding. No hernia.  obese  Musculoskeletal: She exhibits edema and tenderness (multiple small an large joints).  Lymphadenopathy:    She has no cervical adenopathy.  Neurological: She is alert and oriented to person, place, and time. She has normal reflexes. Gait (uses rolling walker but very unsteady) abnormal.  B/l LE weakness  Skin: Skin is warm and dry. No rash noted.  Psychiatric: She has a normal mood and affect. Her behavior is normal. Judgment and thought content normal.    Labs reviewed: Basic Metabolic Panel: Recent Labs    09/11/17 0938 12/25/17 0758 03/02/18 2353  NA 140 137 137  K 4.4 4.0 5.0  CL 105 104 102  CO2 28 24 25   GLUCOSE 90 132* 95  BUN 12 12  12   CREATININE 0.88 1.04* 1.08*  CALCIUM 9.3 8.8* 8.5*  TSH 2.02  --   --    Liver Function Tests: Recent Labs    05/20/17 0903 09/11/17 0938  AST 12 10  ALT 7 9  BILITOT 0.4 0.6  PROT 6.7 6.6   No results for input(s): LIPASE, AMYLASE in the last 8760 hours. No results for input(s): AMMONIA in the last 8760 hours. CBC: Recent Labs    09/11/17 0938 12/25/17 0758 03/02/18 2353  WBC 5.5 6.3 7.1  NEUTROABS 2,409  --   --   HGB 12.0 11.7* 11.6*  HCT 35.4 38.4 37.6  MCV 82.7 89.1 87.6  PLT 344 388 203   Lipid Panel: No results for input(s): CHOL, HDL, LDLCALC, TRIG, CHOLHDL, LDLDIRECT in the last 8760 hours. Lab Results  Component Value Date   HGBA1C 5.4 09/11/2017    Procedures since last visit: Dg Chest 2 View  Result Date: 03/03/2018 CLINICAL DATA:  Sudden sharp upper left chest pain radiating to the left neck and left arm. EXAM: CHEST - 2 VIEW COMPARISON:  12/25/2017 FINDINGS: Shallow inspiration with elevation of the left hemidiaphragm. Heart size and pulmonary vascularity appear normal. No airspace disease or consolidation in the lungs. No blunting of costophrenic angles. No pneumothorax. Mediastinal contours appear intact. Calcification of the aorta. IMPRESSION: No active cardiopulmonary disease. Electronically Signed   By: Burman Nieves M.D.   On: 03/03/2018 00:43    Assessment/Plan   ICD-10-CM   1. Fibromyalgia M79.7 amitriptyline (ELAVIL) 75 MG tablet    methocarbamol (ROBAXIN) 500 MG tablet  2. Severe episode of recurrent major depressive disorder, without psychotic features (HCC) F33.2 amitriptyline (ELAVIL) 75 MG tablet  3. Insomnia, unspecified type G47.00 amitriptyline (ELAVIL) 75 MG tablet  4. Seasonal allergic rhinitis, unspecified trigger J30.2 fluticasone (FLONASE) 50 MCG/ACT nasal spray    loratadine (EQ ALLERGY RELIEF) 10 MG tablet  5. Cough variant asthma J45.991 albuterol (VENTOLIN HFA) 108 (90 Base) MCG/ACT inhaler  6. Essential hypertension  I10 metoprolol tartrate (LOPRESSOR) 50 MG tablet  7. Gastroesophageal reflux disease, esophagitis presence not specified K21.9 ranitidine (ZANTAC) 150 MG tablet  8. Prediabetes R73.03   9. Chronic pain syndrome G89.4   10. Unsteady gait R26.81   11. Frequent falls R29.6   12. Spinal stenosis of lumbosacral region M48.07       Prefers labs to be drawn ON next appt (cmp, lipid panel, a1c)  Continue current medications as ordered  Follow up with specialists as scheduled  Flu shot given today  Script for motorized wheelchair written today  Follow up in 3 mos with Shanda Bumps for routine visit.    Kamarie Veno S. Ancil Linsey  [facility] [address] [place], [place] [address] [phone] Cell (Monday-Friday 8 AM - 5 PM) [phone] After 5 PM and follow prompts

## 2018-04-06 ENCOUNTER — Emergency Department (HOSPITAL_COMMUNITY): Payer: Medicare HMO

## 2018-04-06 ENCOUNTER — Emergency Department (HOSPITAL_COMMUNITY)
Admission: EM | Admit: 2018-04-06 | Discharge: 2018-04-06 | Disposition: A | Discharge status: Home / Self Care | Payer: Medicare HMO | Attending: Emergency Medicine | Admitting: Emergency Medicine

## 2018-04-06 ENCOUNTER — Other Ambulatory Visit: Payer: Self-pay

## 2018-04-06 ENCOUNTER — Encounter (HOSPITAL_COMMUNITY): Payer: Self-pay | Admitting: Emergency Medicine

## 2018-04-06 DIAGNOSIS — S79912A Unspecified injury of left hip, initial encounter: Secondary | ICD-10-CM | POA: Diagnosis not present

## 2018-04-06 DIAGNOSIS — Z7982 Long term (current) use of aspirin: Secondary | ICD-10-CM | POA: Diagnosis not present

## 2018-04-06 DIAGNOSIS — W19XXXA Unspecified fall, initial encounter: Secondary | ICD-10-CM

## 2018-04-06 DIAGNOSIS — M25562 Pain in left knee: Secondary | ICD-10-CM | POA: Diagnosis not present

## 2018-04-06 DIAGNOSIS — M5489 Other dorsalgia: Secondary | ICD-10-CM | POA: Diagnosis not present

## 2018-04-06 DIAGNOSIS — I10 Essential (primary) hypertension: Secondary | ICD-10-CM | POA: Diagnosis not present

## 2018-04-06 DIAGNOSIS — M542 Cervicalgia: Secondary | ICD-10-CM | POA: Diagnosis not present

## 2018-04-06 DIAGNOSIS — Z79899 Other long term (current) drug therapy: Secondary | ICD-10-CM | POA: Diagnosis not present

## 2018-04-06 DIAGNOSIS — J45909 Unspecified asthma, uncomplicated: Secondary | ICD-10-CM | POA: Diagnosis not present

## 2018-04-06 DIAGNOSIS — Y999 Unspecified external cause status: Secondary | ICD-10-CM | POA: Diagnosis not present

## 2018-04-06 DIAGNOSIS — S8002XA Contusion of left knee, initial encounter: Secondary | ICD-10-CM | POA: Diagnosis not present

## 2018-04-06 DIAGNOSIS — Y939 Activity, unspecified: Secondary | ICD-10-CM | POA: Diagnosis not present

## 2018-04-06 DIAGNOSIS — S8001XA Contusion of right knee, initial encounter: Secondary | ICD-10-CM | POA: Diagnosis not present

## 2018-04-06 DIAGNOSIS — Y929 Unspecified place or not applicable: Secondary | ICD-10-CM | POA: Diagnosis not present

## 2018-04-06 DIAGNOSIS — M545 Low back pain: Secondary | ICD-10-CM | POA: Diagnosis not present

## 2018-04-06 DIAGNOSIS — M25561 Pain in right knee: Secondary | ICD-10-CM | POA: Diagnosis not present

## 2018-04-06 DIAGNOSIS — S8000XA Contusion of unspecified knee, initial encounter: Secondary | ICD-10-CM

## 2018-04-06 DIAGNOSIS — M25552 Pain in left hip: Secondary | ICD-10-CM | POA: Diagnosis not present

## 2018-04-06 DIAGNOSIS — S79911A Unspecified injury of right hip, initial encounter: Secondary | ICD-10-CM | POA: Diagnosis not present

## 2018-04-06 DIAGNOSIS — X58XXXA Exposure to other specified factors, initial encounter: Secondary | ICD-10-CM | POA: Diagnosis not present

## 2018-04-06 DIAGNOSIS — R52 Pain, unspecified: Secondary | ICD-10-CM | POA: Diagnosis not present

## 2018-04-06 DIAGNOSIS — M25551 Pain in right hip: Secondary | ICD-10-CM | POA: Diagnosis not present

## 2018-04-06 MED ORDER — OXYCODONE HCL 5 MG PO TABS
15.0000 mg | ORAL_TABLET | Freq: Once | ORAL | Status: AC
  Administered 2018-04-06: 15 mg via ORAL
  Filled 2018-04-06: qty 3

## 2018-04-06 NOTE — ED Triage Notes (Signed)
Patient presents to the ED by EMS with c/o lower back pain and neck pain r/t a fall today. Denies LOC, headache or dizziness. She was using a walker and it slipped out in front of her.  VSS. No blood thinners.

## 2018-04-06 NOTE — ED Notes (Signed)
Patient transported to X-ray 

## 2018-04-06 NOTE — ED Provider Notes (Signed)
[facility] EMERGENCY DEPARTMENT Provider Note   CSN: [id number] Arrival date & time: 04/06/18  1002     History   Chief Complaint Chief Complaint  Patient presents with  . Fall  . Neck Pain  . Back Pain    HPI Kenzlie Disch is a 67 y.o. female.  HPI  67 year old female, history of multiple chronic pain conditions including fibromyalgia, chronic back pain, cervical scoliosis, sciatica, spinal stenosis, she walks with a walker at all times and has done so for the last 10 years, she has had progressive deconditioning and when she went to the court house today as she went through the metal detector they did not allow her to use her roller walker, she had nothing to hold onto and collapsed to the ground.  She laid there until 4 people were able to assist her back into a chair or sitting position, the paramedics arrived and were able to help her onto the stretcher, they immobilized her neck with a towel roll, the patient complains mostly of bilateral knee and hip pain.  She states she did not hit her head, she has neck and back pain which is chronic and has not had her morning OxyContin which she thinks is why she hurts so much in her back.  The symptoms are persistent, worse with movement of the legs, not associated with bleeding or open wounds.  Past Medical History:  Diagnosis Date  . Allergy   . Anxiety   . Arthritis    "99% of my body" (11/11/2016)  . Asthma   . Bursitis of both hips   . Cataract   . Cervical scoliosis   . Chronic back pain    "all over my back" (11/11/2016)  . Depressive disorder, not elsewhere classified   . Enthesopathy of hip region   . Environmental allergies    "I take Claritin qd; 365 days/year" (11/11/2016)  . Essential hypertension, benign   . Fibromyalgia   . Frequent falls   . GERD (gastroesophageal reflux disease)   . Headache    "at least 1/wk; may last for 2 days or so" (11/11/2016)  . History of blood transfusion 1985   w/hysterectomy  . Lumbar stenosis   . Migraine    "a few/month" (11/11/2016)  . Muscle weakness (generalized)   . Myalgia and myositis, unspecified   . Osteoporosis   . Other abnormal blood chemistry   . Other malaise and fatigue   . Raynaud's syndrome   . Sciatic nerve pain, left   . Sciatica   . Unspecified vitamin D deficiency     Patient Active Problem List   Diagnosis Date Noted  . Prediabetes 09/12/2017  . High risk medication use 09/12/2017  . Frequent falls 09/12/2017  . Chronic pain syndrome 08/14/2017  . Gastroesophageal reflux disease 08/14/2017  . Cough variant asthma 03/14/2017  . Pulmonary hypertension (HCC) 03/12/2017  . Chest pain 11/11/2016  . Asthma 11/11/2016  . Atypical chest pain   . Costochondritis   . Chronic congestive heart failure (HCC)   . MDD (major depressive disorder), recurrent episode (HCC) 05/23/2015  . Allergic rhinitis 11/03/2014  . Paresthesia 09/08/2014  . Peripheral neuropathy 07/14/2014  . Morbid obesity due to excess calories (HCC) 07/14/2014  . Pain in limb 07/13/2014  . Abnormality of gait 07/13/2014  . Fibromyalgia 06/13/2014  . Spinal stenosis 01/27/2013  . HTN (hypertension) 11/30/2012    Past Surgical History:  Procedure Laterality Date  . ABDOMINAL HYSTERECTOMY  1985  .  CESAREAN SECTION  1976; 1979  . SHOULDER OPEN ROTATOR CUFF REPAIR Left      OB History   None      Home Medications    Prior to Admission medications   Medication Sig Start Date End Date Taking? Authorizing Provider  albuterol (VENTOLIN HFA) 108 (90 Base) MCG/ACT inhaler INHALE 2 PUFFS  EVERY 6 (SIX) HOURS AS NEEDED FOR WHEEZING OR SHORTNESS OF BREATH. 03/24/18   Kirt Boys, DO  amitriptyline (ELAVIL) 75 MG tablet Take 1 tablet (75 mg total) by mouth at bedtime. For mood 03/24/18   Kirt Boys, DO  aspirin 81 MG tablet Take 81 mg by mouth daily.    [provider]  Cholecalciferol (VITAMIN D3) 2000 units TABS Take by mouth daily.     [provider]  citalopram (CELEXA) 20 MG tablet TAKE 1 TABLET (20 MG TOTAL) BY MOUTH DAILY. 12/30/17   Kirt Boys, DO  fluticasone [facility]) 50 MCG/ACT nasal spray Place 2 sprays into both nostrils daily as needed for allergies or rhinitis. 03/24/18   Kirt Boys, DO  gabapentin (NEURONTIN) 400 MG capsule TAKE 3 CAPSULES TWICE DAILY  AND TAKE 2 CAPSULES AT BEDTIME  FOR  NEUROPATHY 12/30/17   Kirt Boys, DO  loratadine (EQ ALLERGY RELIEF) 10 MG tablet Take 1 tablet (10 mg total) by mouth daily. 03/24/18   Kirt Boys, DO  methocarbamol (ROBAXIN) 500 MG tablet Take 1 tablet (500 mg total) by mouth 2 (two) times daily. For muscle spasm 03/24/18   Kirt Boys, DO  metoprolol tartrate (LOPRESSOR) 50 MG tablet TAKE ONE TABLET TWICE DAILY FOR BLOOD PRESSURE 03/24/18   Kirt Boys, DO  oxyCODONE (ROXICODONE) 15 MG immediate release tablet Take 1 tablet (15 mg total) by mouth every 4 (four) hours as needed for pain. 03/23/18   Kirt Boys, DO  ranitidine (ZANTAC) 150 MG tablet Take 1 tablet (150 mg total) by mouth at bedtime. For acid reflux 03/24/18   Kirt Boys, DO    Family History Family History  Problem Relation Age of Onset  . Dementia Mother   . Heart disease Mother   . Hypertension Mother   . Diabetes Mother   . Diabetes Sister   . Cancer Sister        lung  . Cancer Other        Nepher (Mothers side)  . Diabetes Maternal Grandmother   . Arthritis Maternal Grandmother   . Diabetes Cousin        Mother's side  . Arthritis Sister   . Arthritis Cousin        Mother's side   . Colon cancer Neg Hx   . Esophageal cancer Neg Hx   . Liver cancer Neg Hx   . Pancreatic cancer Neg Hx   . Rectal cancer Neg Hx   . Stomach cancer Neg Hx     Social History Social History   Tobacco Use  . Smoking status: Never Smoker  . Smokeless tobacco: Never Used  Substance Use Topics  . Alcohol use: No    Alcohol/week: 0.0 standard drinks  . Drug use: No      Allergies   Patient has no known allergies.   Review of Systems Review of Systems  All other systems reviewed and are negative.    Physical Exam Updated Vital Signs BP (!) 146/62 (BP Location: Right Arm)   Pulse 65   Temp 97.9 F (36.6 C) (Oral)   Resp 16   Ht 1.626 m (5'  4")   Wt 109.8 kg   SpO2 99%   BMI 41.54 kg/m   Physical Exam  Constitutional: She appears well-developed and well-nourished. No distress.  HENT:  Head: Normocephalic and atraumatic.  Mouth/Throat: Oropharynx is clear and moist. No oropharyngeal exudate.  Eyes: Pupils are equal, round, and reactive to light. Conjunctivae and EOM are normal. Right eye exhibits no discharge. Left eye exhibits no discharge. No scleral icterus.  Neck: Normal range of motion. Neck supple. No JVD present. No thyromegaly present.  The patient's neck is very supple, there is no posterior cervical tenderness, it is all muscular on the lateral aspect of the neck bilaterally.  Cardiovascular: Normal rate, regular rhythm, normal heart sounds and intact distal pulses. Exam reveals no gallop and no friction rub.  No murmur heard. Pulmonary/Chest: Effort normal and breath sounds normal. No respiratory distress. She has no wheezes. She has no rales.  Abdominal: Soft. Bowel sounds are normal. She exhibits no distension and no mass. There is no tenderness.  Musculoskeletal: Normal range of motion. She exhibits tenderness. She exhibits no edema.  The patient is able to straight leg raise bilaterally though it is very weak, she states that is normal for her.  She is able to move all 4 extremities with normal strength for her baseline, she is able to range both of her knees passively however there is tenderness with this motion.  There is no open wounds when the skin is inspected.  There is no crepitance or subcu tenderness emphysema.  Her ankle joints are normal bilaterally.  Lymphadenopathy:    She has no cervical adenopathy.   Neurological: She is alert. Coordination normal.  Awake alert and able to follow commands appropriately.  Skin: Skin is warm and dry. No rash noted. No erythema.  Psychiatric: She has a normal mood and affect. Her behavior is normal.  Nursing note and vitals reviewed.    ED Treatments / Results  Labs (all labs ordered are listed, but only abnormal results are displayed) Labs Reviewed - No data to display  EKG None  Radiology Dg Knee Complete 4 Views Left  Result Date: 04/06/2018 CLINICAL DATA:  Bilateral knee pain. EXAM: LEFT KNEE - COMPLETE 4+ VIEW COMPARISON:  None. FINDINGS: No evidence of fracture, dislocation, or joint effusion. Three compartment osteoarthritic changes with joint space narrowing, mild subchondral sclerosis and osteophytosis. Meniscal calcifications noted. Soft tissues are normal. IMPRESSION: Three compartment mild to moderate osteoarthritic changes of the left knee. Electronically Signed   By: Ted Mcalpine M.D.   On: 04/06/2018 11:50   Dg Knee Complete 4 Views Right  Result Date: 04/06/2018 CLINICAL DATA:  Bilateral knee pain post fall. EXAM: RIGHT KNEE - COMPLETE 4+ VIEW COMPARISON:  03/27/2016 FINDINGS: No evidence of fracture, dislocation, or joint effusion. Mild to moderate 3 compartment osteoarthritic changes, most pronounced in the medial compartment. Osteophytosis off of the tibial spines and medial tibial plateau. IMPRESSION: No acute fracture or dislocation identified about the right knee. Mild to moderate 3 compartment osteoarthritic changes, most pronounced in the medial compartment. Electronically Signed   By: Ted Mcalpine M.D.   On: 04/06/2018 12:00   Dg Hip Unilat W Or Wo Pelvis 2-3 Views Left  Result Date: 04/06/2018 CLINICAL DATA:  Fall. Bilateral hip pain. History of bursitis, enthesopathy of both hips, frequent falls. EXAM: DG HIP (WITH OR WITHOUT PELVIS) 2-3V LEFT; DG HIP (WITH OR WITHOUT PELVIS) 2-3V RIGHT COMPARISON:  AP pelvis and  right hip series of August 03, 2015  FINDINGS: The bony pelvis is subjectively adequately mineralized. There is no acute or healing fracture. AP and lateral views of both hips reveal preservation of the joint spaces. The articular surfaces of the femoral heads and acetabuli remains smoothly rounded. The femoral necks, intertrochanteric, and subtrochanteric regions are normal. IMPRESSION: There is no acute or significant chronic bony abnormality of either hip. The bony pelvis is intact. Electronically Signed   By: David  [place] M.D.   On: 04/06/2018 11:48   Dg Hip Unilat W Or Wo Pelvis 2-3 Views Right  Result Date: 04/06/2018 CLINICAL DATA:  Fall. Bilateral hip pain. History of bursitis, enthesopathy of both hips, frequent falls. EXAM: DG HIP (WITH OR WITHOUT PELVIS) 2-3V LEFT; DG HIP (WITH OR WITHOUT PELVIS) 2-3V RIGHT COMPARISON:  AP pelvis and right hip series of August 03, 2015 FINDINGS: The bony pelvis is subjectively adequately mineralized. There is no acute or healing fracture. AP and lateral views of both hips reveal preservation of the joint spaces. The articular surfaces of the femoral heads and acetabuli remains smoothly rounded. The femoral necks, intertrochanteric, and subtrochanteric regions are normal. IMPRESSION: There is no acute or significant chronic bony abnormality of either hip. The bony pelvis is intact. Electronically Signed   By: David  [place] M.D.   On: 04/06/2018 11:48    Procedures Procedures (including critical care time)  Medications Ordered in ED Medications  oxyCODONE (Oxy IR/ROXICODONE) immediate release tablet 15 mg (15 mg Oral Given 04/06/18 1140)     Initial Impression / Assessment and Plan / ED Course  I have reviewed the triage vital signs and the nursing notes.  Pertinent labs & imaging results that were available during my care of the patient were reviewed by me and considered in my medical decision making (see chart for details).  Clinical Course as of Apr 06 1209  Tue Apr 06, 2018  1209 The x-rays of both knees and both hips appear to have degenerative changes without acute fractures.  The patient was updated, she has been given pain medication, at this time the patient appears stable for discharge.  She is agreeable to the plan   [BM]    Clinical Course User Index [BM] Eber Hong, MD    What I am seeing is that the patient likely has some muscular skeletal injury, doubt fractures however given that she is so deconditioned she likely has severe osteoporosis.  X-rays will be obtained of the knees and the hips bilaterally, that being said she is low risk for other injuries and does not need imaging of her head or neck.  I suspect this is muscular, she will be given a dose of her home pain medication.  Final Clinical Impressions(s) / ED Diagnoses   Final diagnoses:  Contusion of knee, unspecified laterality, initial encounter  Fall, initial encounter    ED Discharge Orders    None       Eber Hong, MD 04/06/18 1210

## 2018-04-06 NOTE — Discharge Instructions (Signed)
Please take your home pain medications exactly as prescribed by your doctor.  You should return to the emergency department immediately for severe or worsening symptoms including increasing pain, weakness or frequent falls.

## 2018-04-06 NOTE — ED Notes (Signed)
Pt verbalized understanding of discharge instructions and denies any further questions at this time.   

## 2018-04-11 ENCOUNTER — Other Ambulatory Visit: Payer: Self-pay | Admitting: Internal Medicine

## 2018-04-11 DIAGNOSIS — J302 Other seasonal allergic rhinitis: Secondary | ICD-10-CM

## 2018-04-17 ENCOUNTER — Other Ambulatory Visit: Payer: Self-pay

## 2018-04-17 ENCOUNTER — Encounter (HOSPITAL_COMMUNITY): Payer: Self-pay | Admitting: Emergency Medicine

## 2018-04-17 ENCOUNTER — Ambulatory Visit (HOSPITAL_COMMUNITY)
Admission: EM | Admit: 2018-04-17 | Discharge: 2018-04-17 | Disposition: A | Discharge status: Home / Self Care | Payer: Medicare HMO | Attending: Internal Medicine | Admitting: Internal Medicine

## 2018-04-17 DIAGNOSIS — K0889 Other specified disorders of teeth and supporting structures: Secondary | ICD-10-CM

## 2018-04-17 MED ORDER — MAGIC MOUTHWASH W/LIDOCAINE: 5.0000 mL | Freq: Three times a day (TID) | ORAL | 0 refills | Status: DC | PRN

## 2018-04-17 MED ORDER — AMOXICILLIN 500 MG PO TABS: 500.0000 mg | ORAL_TABLET | Freq: Two times a day (BID) | ORAL | 0 refills | Status: AC

## 2018-04-17 NOTE — ED Triage Notes (Signed)
Mouth is stinging and sore, coughing up thick green phlegm, and patient has a broken tooth on upper right

## 2018-04-17 NOTE — ED Provider Notes (Signed)
[facility]    CSN: [id number] Arrival date & time: 04/17/18  1220     History   Chief Complaint Chief Complaint  Patient presents with  . Dental Pain    HPI Melinda [name] is a 67 y.o. female.   67 year old female presents with tingling sensation to tongue and lips that patient associates with a known fractured molar tooth.  Condition is acute on chronic in nature.  Condition is made worse by nothing.  Condition is made better but.  Patient denies any treatment prior to arrival at this facility. Patient denied knowledge that it might be an allergic reaction.  And states that she is not eat now and has had no possible allergens.  She is requesting antibiotic and a note from her previous emergency room visit to present to the court.     Past Medical History:  Diagnosis Date  . Allergy   . Anxiety   . Arthritis    "99% of my body" (11/11/2016)  . Asthma   . Bursitis of both hips   . Cataract   . Cervical scoliosis   . Chronic back pain    "all over my back" (11/11/2016)  . Depressive disorder, not elsewhere classified   . Enthesopathy of hip region   . Environmental allergies    "I take Claritin qd; 365 days/year" (11/11/2016)  . Essential hypertension, benign   . Fibromyalgia   . Frequent falls   . GERD (gastroesophageal reflux disease)   . Headache    "at least 1/wk; may last for 2 days or so" (11/11/2016)  . History of blood transfusion 1985   w/hysterectomy  . Lumbar stenosis   . Migraine    "a few/month" (11/11/2016)  . Muscle weakness (generalized)   . Myalgia and myositis, unspecified   . Osteoporosis   . Other abnormal blood chemistry   . Other malaise and fatigue   . Raynaud's syndrome   . Sciatic nerve pain, left   . Sciatica   . Unspecified vitamin D deficiency     Patient Active Problem List   Diagnosis Date Noted  . Prediabetes 09/12/2017  . High risk medication use 09/12/2017  . Frequent falls 09/12/2017  . Chronic pain syndrome  08/14/2017  . Gastroesophageal reflux disease 08/14/2017  . Cough variant asthma 03/14/2017  . Pulmonary hypertension (HCC) 03/12/2017  . Chest pain 11/11/2016  . Asthma 11/11/2016  . Atypical chest pain   . Costochondritis   . Chronic congestive heart failure (HCC)   . MDD (major depressive disorder), recurrent episode (HCC) 05/23/2015  . Allergic rhinitis 11/03/2014  . Paresthesia 09/08/2014  . Peripheral neuropathy 07/14/2014  . Morbid obesity due to excess calories (HCC) 07/14/2014  . Pain in limb 07/13/2014  . Abnormality of gait 07/13/2014  . Fibromyalgia 06/13/2014  . Spinal stenosis 01/27/2013  . HTN (hypertension) 11/30/2012    Past Surgical History:  Procedure Laterality Date  . ABDOMINAL HYSTERECTOMY  1985  . CESAREAN SECTION  1976; 1979  . SHOULDER OPEN ROTATOR CUFF REPAIR Left     OB History   None      Home Medications    Prior to Admission medications   Medication Sig Start Date End Date Taking? Authorizing Provider  albuterol (VENTOLIN HFA) 108 (90 Base) MCG/ACT inhaler INHALE 2 PUFFS  EVERY 6 (SIX) HOURS AS NEEDED FOR WHEEZING OR SHORTNESS OF BREATH. 03/24/18   Kirt Boys, DO  amitriptyline (ELAVIL) 75 MG tablet Take 1 tablet (75 mg total) by mouth  at bedtime. For mood 03/24/18   Kirt Boys, DO  aspirin 81 MG tablet Take 81 mg by mouth daily.    [provider]  Cholecalciferol (VITAMIN D3) 2000 units TABS Take by mouth daily.    [provider]  citalopram (CELEXA) 20 MG tablet TAKE 1 TABLET (20 MG TOTAL) BY MOUTH DAILY. 12/30/17   Kirt Boys, DO  fluticasone [facility]) 50 MCG/ACT nasal spray Place 2 sprays into both nostrils daily as needed for allergies or rhinitis. 03/24/18   Kirt Boys, DO  gabapentin (NEURONTIN) 400 MG capsule TAKE 3 CAPSULES TWICE DAILY  AND TAKE 2 CAPSULES AT BEDTIME  FOR  NEUROPATHY 12/30/17   Kirt Boys, DO  loratadine (EQ ALLERGY RELIEF) 10 MG tablet Take 1 tablet (10 mg total) by mouth daily.  03/24/18   Kirt Boys, DO  methocarbamol (ROBAXIN) 500 MG tablet Take 1 tablet (500 mg total) by mouth 2 (two) times daily. For muscle spasm 03/24/18   Kirt Boys, DO  metoprolol tartrate (LOPRESSOR) 50 MG tablet TAKE ONE TABLET TWICE DAILY FOR BLOOD PRESSURE 03/24/18   Kirt Boys, DO  oxyCODONE (ROXICODONE) 15 MG immediate release tablet Take 1 tablet (15 mg total) by mouth every 4 (four) hours as needed for pain. 03/23/18   Kirt Boys, DO  ranitidine (ZANTAC) 150 MG tablet Take 1 tablet (150 mg total) by mouth at bedtime. For acid reflux 03/24/18   Kirt Boys, DO    Family History Family History  Problem Relation Age of Onset  . Dementia Mother   . Heart disease Mother   . Hypertension Mother   . Diabetes Mother   . Diabetes Sister   . Cancer Sister        lung  . Cancer Other        Nepher (Mothers side)  . Diabetes Maternal Grandmother   . Arthritis Maternal Grandmother   . Diabetes Cousin        Mother's side  . Arthritis Sister   . Arthritis Cousin        Mother's side   . Colon cancer Neg Hx   . Esophageal cancer Neg Hx   . Liver cancer Neg Hx   . Pancreatic cancer Neg Hx   . Rectal cancer Neg Hx   . Stomach cancer Neg Hx     Social History Social History   Tobacco Use  . Smoking status: Never Smoker  . Smokeless tobacco: Never Used  Substance Use Topics  . Alcohol use: No    Alcohol/week: 0.0 standard drinks  . Drug use: No     Allergies   Patient has no known allergies.   Review of Systems Review of Systems   Physical Exam Triage Vital Signs ED Triage Vitals  Enc Vitals Group     BP 04/17/18 1335 137/70     Pulse Rate 04/17/18 1335 67     Resp 04/17/18 1335 20     Temp 04/17/18 1335 97.8 F (36.6 C)     Temp Source 04/17/18 1335 Oral     SpO2 04/17/18 1335 98 %     Weight --      Height --      Head Circumference --      Peak Flow --      Pain Score 04/17/18 1333 7     Pain Loc --      Pain Edu? --      Excl. in GC? --      No data found.  Updated Vital Signs BP 137/70 (BP Location: Left Arm) Comment (BP Location): large cuff  Pulse 67   Temp 97.8 F (36.6 C) (Oral)   Resp 20   SpO2 98%   Visual Acuity Right Eye Distance:   Left Eye Distance:   Bilateral Distance:    Right Eye Near:   Left Eye Near:    Bilateral Near:     Physical Exam   UC Treatments / Results  Labs (all labs ordered are listed, but only abnormal results are displayed) Labs Reviewed - No data to display  EKG None  Radiology No results found.  Procedures Procedures (including critical care time)  Medications Ordered in UC Medications - No data to display  Initial Impression / Assessment and Plan / UC Course  I have reviewed the triage vital signs and the nursing notes.  Pertinent labs & imaging results that were available during my care of the patient were reviewed by me and considered in my medical decision making (see chart for details).      Final Clinical Impressions(s) / UC Diagnoses   Final diagnoses:  None   Discharge Instructions   None    ED Prescriptions    None     Controlled Substance Prescriptions [place] Controlled Substance Registry consulted? Not Applicable   Alene Mires, NP 04/17/18 1409

## 2018-04-17 NOTE — Discharge Instructions (Addendum)
Please follow up with dentist. Paperwork provided for possible contacts

## 2018-04-19 ENCOUNTER — Other Ambulatory Visit: Payer: Self-pay | Admitting: *Deleted

## 2018-04-19 DIAGNOSIS — J302 Other seasonal allergic rhinitis: Secondary | ICD-10-CM

## 2018-04-21 ENCOUNTER — Other Ambulatory Visit: Payer: Self-pay | Admitting: *Deleted

## 2018-04-21 DIAGNOSIS — G609 Hereditary and idiopathic neuropathy, unspecified: Secondary | ICD-10-CM

## 2018-04-21 DIAGNOSIS — M5430 Sciatica, unspecified side: Secondary | ICD-10-CM

## 2018-04-21 DIAGNOSIS — M4800 Spinal stenosis, site unspecified: Secondary | ICD-10-CM

## 2018-04-21 DIAGNOSIS — G894 Chronic pain syndrome: Secondary | ICD-10-CM

## 2018-04-21 DIAGNOSIS — M797 Fibromyalgia: Secondary | ICD-10-CM

## 2018-04-21 MED ORDER — OXYCODONE HCL 15 MG PO TABS: 15.0000 mg | ORAL_TABLET | ORAL | 0 refills | Status: DC | PRN

## 2018-04-21 NOTE — Telephone Encounter (Signed)
Patient requested refill [place] Verified LR: 03/23/2018 Pharmacy Confirmed Pended Rx and sent to Dr. Carter for approval.  

## 2018-05-25 ENCOUNTER — Other Ambulatory Visit: Payer: Self-pay | Admitting: *Deleted

## 2018-05-25 DIAGNOSIS — M5430 Sciatica, unspecified side: Secondary | ICD-10-CM

## 2018-05-25 DIAGNOSIS — M797 Fibromyalgia: Secondary | ICD-10-CM

## 2018-05-25 DIAGNOSIS — G609 Hereditary and idiopathic neuropathy, unspecified: Secondary | ICD-10-CM

## 2018-05-25 DIAGNOSIS — M4800 Spinal stenosis, site unspecified: Secondary | ICD-10-CM

## 2018-05-25 DIAGNOSIS — G894 Chronic pain syndrome: Secondary | ICD-10-CM

## 2018-05-25 MED ORDER — OXYCODONE HCL 15 MG PO TABS: 15.0000 mg | ORAL_TABLET | ORAL | 0 refills | Status: DC | PRN

## 2018-05-25 NOTE — Telephone Encounter (Signed)
LMOM regarding Melinda [name]'s response.

## 2018-05-25 NOTE — Telephone Encounter (Signed)
Needs follow up visit scheduled for December or early jan per dr carter needed to be seen in 3 months

## 2018-05-25 NOTE — Telephone Encounter (Signed)
Patient requested refill [place] Database Verified LR: 04/21/2018 Pended Rx and sent to Jessica for approval.  

## 2018-06-22 ENCOUNTER — Other Ambulatory Visit: Payer: Self-pay | Admitting: *Deleted

## 2018-06-22 DIAGNOSIS — M4800 Spinal stenosis, site unspecified: Secondary | ICD-10-CM

## 2018-06-22 DIAGNOSIS — M797 Fibromyalgia: Secondary | ICD-10-CM

## 2018-06-22 DIAGNOSIS — G609 Hereditary and idiopathic neuropathy, unspecified: Secondary | ICD-10-CM

## 2018-06-22 DIAGNOSIS — M5430 Sciatica, unspecified side: Secondary | ICD-10-CM

## 2018-06-22 DIAGNOSIS — G894 Chronic pain syndrome: Secondary | ICD-10-CM

## 2018-06-22 MED ORDER — OXYCODONE HCL 15 MG PO TABS: 15.0000 mg | ORAL_TABLET | ORAL | 0 refills | Status: DC | PRN

## 2018-06-22 NOTE — Telephone Encounter (Signed)
Patient requested refill [place] Database Verified LR: 05/25/2018 Pended Rx and sent to [facility] (covering for [place])

## 2018-07-26 ENCOUNTER — Other Ambulatory Visit: Payer: Self-pay | Admitting: *Deleted

## 2018-07-26 DIAGNOSIS — M4800 Spinal stenosis, site unspecified: Secondary | ICD-10-CM

## 2018-07-26 DIAGNOSIS — M5430 Sciatica, unspecified side: Secondary | ICD-10-CM

## 2018-07-26 DIAGNOSIS — G609 Hereditary and idiopathic neuropathy, unspecified: Secondary | ICD-10-CM

## 2018-07-26 DIAGNOSIS — G894 Chronic pain syndrome: Secondary | ICD-10-CM

## 2018-07-26 DIAGNOSIS — M797 Fibromyalgia: Secondary | ICD-10-CM

## 2018-07-26 MED ORDER — OXYCODONE HCL 15 MG PO TABS: 15.0000 mg | ORAL_TABLET | ORAL | 0 refills | Status: DC | PRN

## 2018-07-26 NOTE — Telephone Encounter (Signed)
Patient requested refill [place] Verified LR: 06/22/2018 Pended Rx and sent to [facility] for Approval.

## 2018-08-20 ENCOUNTER — Ambulatory Visit: Payer: Medicare HMO | Admitting: Family

## 2018-08-20 ENCOUNTER — Ambulatory Visit: Payer: Medicare HMO

## 2018-08-20 ENCOUNTER — Encounter: Payer: Medicare HMO | Admitting: Family

## 2018-08-25 ENCOUNTER — Other Ambulatory Visit: Payer: Self-pay | Admitting: *Deleted

## 2018-08-25 DIAGNOSIS — G894 Chronic pain syndrome: Secondary | ICD-10-CM

## 2018-08-25 DIAGNOSIS — G609 Hereditary and idiopathic neuropathy, unspecified: Secondary | ICD-10-CM

## 2018-08-25 DIAGNOSIS — M797 Fibromyalgia: Secondary | ICD-10-CM

## 2018-08-25 DIAGNOSIS — M5430 Sciatica, unspecified side: Secondary | ICD-10-CM

## 2018-08-25 DIAGNOSIS — M4800 Spinal stenosis, site unspecified: Secondary | ICD-10-CM

## 2018-08-25 MED ORDER — OXYCODONE HCL 15 MG PO TABS: 15.0000 mg | ORAL_TABLET | ORAL | 0 refills | Status: DC | PRN

## 2018-08-25 NOTE — Telephone Encounter (Signed)
Patient requested refill [place] Database Verified LR: 07/26/2018 #180 Pended Rx and sent to [facility] for approval. Patient has an appointment with [name] on 09/03/2018.

## 2018-08-26 ENCOUNTER — Other Ambulatory Visit: Payer: Self-pay | Admitting: *Deleted

## 2018-08-26 DIAGNOSIS — M797 Fibromyalgia: Secondary | ICD-10-CM

## 2018-08-26 DIAGNOSIS — M5430 Sciatica, unspecified side: Secondary | ICD-10-CM

## 2018-08-26 DIAGNOSIS — G894 Chronic pain syndrome: Secondary | ICD-10-CM

## 2018-08-26 DIAGNOSIS — M4800 Spinal stenosis, site unspecified: Secondary | ICD-10-CM

## 2018-08-26 DIAGNOSIS — G609 Hereditary and idiopathic neuropathy, unspecified: Secondary | ICD-10-CM

## 2018-08-26 NOTE — Telephone Encounter (Signed)
Patient requested pain medication. Medication was sent to wrong Pharmacy.  Pended and sent to [facility] for approval.   LR: 07/26/2018

## 2018-08-27 MED ORDER — OXYCODONE HCL 15 MG PO TABS: 15.0000 mg | ORAL_TABLET | ORAL | 0 refills | Status: DC | PRN

## 2018-08-27 NOTE — Telephone Encounter (Signed)
Medication request canceled at [facility].  Please send to [facility] instead.   Rx Pended.

## 2018-09-03 ENCOUNTER — Ambulatory Visit (INDEPENDENT_AMBULATORY_CARE_PROVIDER_SITE_OTHER): Payer: Medicare HMO | Admitting: Family

## 2018-09-03 ENCOUNTER — Encounter: Payer: Self-pay | Admitting: Family

## 2018-09-03 VITALS — BP 112/64 | HR 73 | Temp 97.5°F | Ht 64.0 in | Wt 242.0 lb

## 2018-09-03 DIAGNOSIS — M797 Fibromyalgia: Secondary | ICD-10-CM | POA: Diagnosis not present

## 2018-09-03 DIAGNOSIS — G609 Hereditary and idiopathic neuropathy, unspecified: Secondary | ICD-10-CM | POA: Diagnosis not present

## 2018-09-03 DIAGNOSIS — R2681 Unsteadiness on feet: Secondary | ICD-10-CM

## 2018-09-03 DIAGNOSIS — Z1211 Encounter for screening for malignant neoplasm of colon: Secondary | ICD-10-CM | POA: Diagnosis not present

## 2018-09-03 DIAGNOSIS — Z0000 Encounter for general adult medical examination without abnormal findings: Secondary | ICD-10-CM

## 2018-09-03 DIAGNOSIS — F332 Major depressive disorder, recurrent severe without psychotic features: Secondary | ICD-10-CM | POA: Diagnosis not present

## 2018-09-03 DIAGNOSIS — K219 Gastro-esophageal reflux disease without esophagitis: Secondary | ICD-10-CM | POA: Diagnosis not present

## 2018-09-03 DIAGNOSIS — I10 Essential (primary) hypertension: Secondary | ICD-10-CM | POA: Diagnosis not present

## 2018-09-03 DIAGNOSIS — J4520 Mild intermittent asthma, uncomplicated: Secondary | ICD-10-CM | POA: Diagnosis not present

## 2018-09-03 DIAGNOSIS — R296 Repeated falls: Secondary | ICD-10-CM

## 2018-09-03 DIAGNOSIS — G894 Chronic pain syndrome: Secondary | ICD-10-CM | POA: Diagnosis not present

## 2018-09-03 DIAGNOSIS — R7303 Prediabetes: Secondary | ICD-10-CM

## 2018-09-03 MED ORDER — FAMOTIDINE 20 MG PO TABS: 20.0000 mg | ORAL_TABLET | Freq: Every day | ORAL | 3 refills | Status: DC

## 2018-09-03 MED ORDER — CITALOPRAM HYDROBROMIDE 20 MG PO TABS: 20.0000 mg | ORAL_TABLET | Freq: Every day | ORAL | 1 refills | Status: DC

## 2018-09-03 MED ORDER — GABAPENTIN 400 MG PO CAPS: ORAL_CAPSULE | ORAL | 1 refills | Status: DC

## 2018-09-03 MED ORDER — AMITRIPTYLINE HCL 100 MG PO TABS: 100.0000 mg | ORAL_TABLET | Freq: Every day | ORAL | 3 refills | Status: DC

## 2018-09-03 NOTE — Progress Notes (Signed)
Subjective:   Michellie Auxier is a 68 y.o. female who presents for Medicare Annual (Subsequent) preventive examination.  Review of Systems:   Cardiac Risk Factors include: advanced age (>32men, >23 women);hypertension;obesity (BMI >30kg/m2);sedentary lifestyle     Objective:     Vitals: BP 112/64   Pulse 73   Temp (!) 97.5 F (36.4 C) (Oral)   Ht 5\' 4"  (1.626 m)   Wt 242 lb (109.8 kg)   SpO2 95%   BMI 41.54 kg/m   Body mass index is 41.54 kg/m.  Advanced Directives 09/03/2018 09/03/2018 04/06/2018 03/24/2018 03/02/2018 10/23/2017 08/14/2017  Does Patient Have a Medical Advance Directive? No No No No No No Yes  Type of Advance Directive - - - - - - Midwife;Living will  Does patient want to make changes to medical advance directive? - - - - - - No - Patient declined  Copy of Healthcare Power of Attorney in Chart? - - - - - - No - copy requested  Would patient like information on creating a medical advance directive? (No Data) - No - Patient declined No - Patient declined No - Patient declined Yes (MAU/Ambulatory/Procedural Areas - Information given) -    Tobacco Social History   Tobacco Use  Smoking Status Never Smoker  Smokeless Tobacco Never Used     Counseling given: Not Answered   Clinical Intake:  Pre-visit preparation completed: No  Pain : 0-10 Pain Score: 8  Pain Type: Chronic pain Pain Location: Generalized Pain Orientation: Other (Comment)(generalized ) Pain Radiating Towards: down the arms,back and legs  Pain Descriptors / Indicators: Pressure, Squeezing Pain Onset: Other (comment)(since 2010 ) Pain Frequency: Constant Pain Relieving Factors: pain medication Effect of Pain on Daily Activities: " absolutely "   Pain Relieving Factors: pain medication  BMI - recorded: 41.54 Nutritional Status: BMI > 30  Obese Nutritional Risks: None Diabetes: No  How often do you need to have someone help you when you read instructions, pamphlets, or  other written materials from your doctor or pharmacy?: 1 - Never What is the last grade level you completed in school?: 1 year college   Interpreter Needed?: No  Information entered by :: Brooks Kinnan FNP-C   Past Medical History:  Diagnosis Date  . Allergy   . Anxiety   . Arthritis    "99% of my body" (11/11/2016)  . Asthma   . Bursitis of both hips   . Cataract   . Cervical scoliosis   . Chronic back pain    "all over my back" (11/11/2016)  . Depressive disorder, not elsewhere classified   . Enthesopathy of hip region   . Environmental allergies    "I take Claritin qd; 365 days/year" (11/11/2016)  . Essential hypertension, benign   . Fibromyalgia   . Frequent falls   . GERD (gastroesophageal reflux disease)   . Headache    "at least 1/wk; may last for 2 days or so" (11/11/2016)  . History of blood transfusion 1985   w/hysterectomy  . Lumbar stenosis   . Migraine    "a few/month" (11/11/2016)  . Muscle weakness (generalized)   . Myalgia and myositis, unspecified   . Osteoporosis   . Other abnormal blood chemistry   . Other malaise and fatigue   . Raynaud's syndrome   . Sciatic nerve pain, left   . Sciatica   . Unspecified vitamin D deficiency    Past Surgical History:  Procedure Laterality Date  . ABDOMINAL HYSTERECTOMY  1985  . CESAREAN SECTION  1976; 1979  . SHOULDER OPEN ROTATOR CUFF REPAIR Left    Family History  Problem Relation Age of Onset  . Dementia Mother   . Heart disease Mother   . Hypertension Mother   . Diabetes Mother   . Diabetes Sister   . Cancer Sister        lung  . Cancer Other        Nepher (Mothers side)  . Diabetes Maternal Grandmother   . Arthritis Maternal Grandmother   . Diabetes Cousin        Mother's side  . Arthritis Sister   . Arthritis Cousin        Mother's side   . Colon cancer Neg Hx   . Esophageal cancer Neg Hx   . Liver cancer Neg Hx   . Pancreatic cancer Neg Hx   . Rectal cancer Neg Hx   . Stomach cancer Neg Hx     Social History   Socioeconomic History  . Marital status: Divorced    Spouse name: Not on file  . Number of children: 3  . Years of education: 9313  . Highest education level: Not on file  Occupational History    Comment: retired  Engineer, productionocial Needs  . Financial resource strain: Not hard at all  . Food insecurity:    Worry: Never true    Inability: Never true  . Transportation needs:    Medical: No    Non-medical: No  Tobacco Use  . Smoking status: Never Smoker  . Smokeless tobacco: Never Used  Substance and Sexual Activity  . Alcohol use: No    Alcohol/week: 0.0 standard drinks  . Drug use: No  . Sexual activity: Yes  Lifestyle  . Physical activity:    Days per week: 0 days    Minutes per session: 0 min  . Stress: Rather much  Relationships  . Social connections:    Talks on phone: More than three times a week    Gets together: More than three times a week    Attends religious service: Never    Active member of club or organization: No    Attends meetings of clubs or organizations: Never    Relationship status: Widowed  Other Topics Concern  . Not on file  Social History Narrative   Patient lives with her grandson. Patient is retired.   Education some college.   Right handed.    Caffeine two cups daily.    Outpatient Encounter Medications as of 09/03/2018  Medication Sig  . albuterol (VENTOLIN HFA) 108 (90 Base) MCG/ACT inhaler INHALE 2 PUFFS  EVERY 6 (SIX) HOURS AS NEEDED FOR WHEEZING OR SHORTNESS OF BREATH.  Marland Kitchen. antiseptic oral rinse (BIOTENE) LIQD 15 mLs by Mouth Rinse route as needed for dry mouth.  Marland Kitchen. aspirin 81 MG tablet Take 81 mg by mouth daily.  . carboxymethylcellulose (REFRESH PLUS) 0.5 % SOLN Place 2 drops into both eyes 2 (two) times daily as needed.  . Cholecalciferol (VITAMIN D3) 2000 units TABS Take by mouth daily.  . fluticasone (FLONASE) 50 MCG/ACT nasal spray Place 2 sprays into both nostrils daily as needed for allergies or rhinitis.  .  methocarbamol (ROBAXIN) 500 MG tablet Take 1 tablet (500 mg total) by mouth 2 (two) times daily. For muscle spasm  . metoprolol tartrate (LOPRESSOR) 50 MG tablet TAKE ONE TABLET TWICE DAILY FOR BLOOD PRESSURE  . oxyCODONE (ROXICODONE) 15 MG immediate release tablet Take 1 tablet (15  mg total) by mouth every 4 (four) hours as needed for pain.  . [DISCONTINUED] amitriptyline (ELAVIL) 75 MG tablet Take 1 tablet (75 mg total) by mouth at bedtime. For mood  . [DISCONTINUED] citalopram (CELEXA) 20 MG tablet TAKE 1 TABLET (20 MG TOTAL) BY MOUTH DAILY.  . [DISCONTINUED] gabapentin (NEURONTIN) 400 MG capsule TAKE 3 CAPSULES TWICE DAILY  AND TAKE 2 CAPSULES AT BEDTIME  FOR  NEUROPATHY  . [DISCONTINUED] loratadine (EQ ALLERGY RELIEF) 10 MG tablet Take 1 tablet (10 mg total) by mouth daily.  . [DISCONTINUED] magic mouthwash w/lidocaine SOLN Take 5 mLs by mouth 3 (three) times daily as needed for mouth pain.  . [DISCONTINUED] ranitidine (ZANTAC) 150 MG tablet Take 1 tablet (150 mg total) by mouth at bedtime. For acid reflux   No facility-administered encounter medications on file as of 09/03/2018.     Activities of Daily Living In your present state of health, do you have any difficulty performing the following activities: 09/03/2018  Hearing? N  Vision? N  Difficulty concentrating or making decisions? N  Walking or climbing stairs? Y  Comment ambulates with walker   Dressing or bathing? N  Doing errands, shopping? N  Preparing Food and eating ? N  Using the Toilet? N  In the past six months, have you accidently leaked urine? N  Do you have problems with loss of bowel control? N  Managing your Medications? N  Managing your Finances? N  Housekeeping or managing your Housekeeping? Y  Comment grandson assist   Some recent data might be hidden    Patient Care Team: Loney Peto, Donalee Citrininah C, NP as PCP - General (Family Medicine)    Assessment:   This is a routine wellness examination for Stanton KidneyDebra.  Exercise  Activities and Dietary recommendations Current Exercise Habits: The patient does not participate in regular exercise at present, Exercise limited by: Other - see comments(genralaized pain )  Goals    . Exercise 3x per week (30 min per time)     Pt would like to go back to the gym 3 days a week    . Weight (lb) < 200 lb (90.7 kg)     Starting 05/28/16, I will attempt to lose 50 lbs, over the next year and strengthen my leg muscles, so I can stand better.        Fall Risk Fall Risk  09/03/2018 09/03/2018 09/03/2018 03/24/2018 10/30/2017  Falls in the past year? 1 1 1  Yes Yes  Number falls in past yr: 1 1 1 2  or more 2 or more  Comment - - - - Fallen every day this week   Injury with Fall? 0 0 0 No No  Comment - - - - -  Risk Factor Category  - - - - -  Risk for fall due to : Impaired mobility;Medication side effect;History of fall(s);Impaired balance/gait - - - -  Follow up Falls prevention discussed - - - -   Is the patient's home free of loose throw rugs in walkways, pet beds, electrical cords, etc?   no      Grab bars in the bathroom? yes      Handrails on the stairs? N/A no stairs.      Adequate lighting?   yes  Depression Screen PHQ 2/9 Scores 09/03/2018 08/14/2017 01/02/2017 05/28/2016  PHQ - 2 Score 6 0 0 6  PHQ- 9 Score 27 - - 17     Cognitive Function MMSE - Mini Mental State Exam 09/03/2018 08/14/2017  05/28/2016  Orientation to time 5 5 5   Orientation to Place 5 5 5   Registration 3 3 3   Attention/ Calculation 5 5 5   Recall 0 0 0  Language- name 2 objects 2 2 2   Language- repeat 1 1 1   Language- follow 3 step command 3 3 3   Language- read & follow direction 1 1 1   Write a sentence 1 1 1   Copy design 1 1 1   Total score 27 27 27         Immunization History  Administered Date(s) Administered  . Influenza,inj,Quad PF,6+ Mos 05/23/2015, 05/28/2016, 03/24/2018  . Influenza-Unspecified 07/07/2012, 02/28/2014  . Pneumococcal Conjugate-13 07/14/2014  . Pneumococcal  Polysaccharide-23 09/11/2017  . Tdap 01/26/2016    Qualifies for Shingles Vaccine? Decline   Screening Tests Health Maintenance  Topic Date Due  . COLONOSCOPY  10/18/2001  . MAMMOGRAM  09/04/2019  . TETANUS/TDAP  01/25/2026  . INFLUENZA VACCINE  Completed  . DEXA SCAN  Completed  . Hepatitis C Screening  Completed  . PNA vac Low Risk Adult  Completed    Cancer Screenings: Lung: Low Dose CT Chest recommended if Age 65-80 years, 30 pack-year currently smoking OR have quit w/in 15years. Patient does not qualify. Breast:  Up to date on Mammogram? Yes   Up to date of Bone Density/Dexa? Yes Colorectal: last done 2003  Additional Screenings:  Hepatitis C Screening: Completed in the past.      Plan:  - low carbohydrate ,low saturated fats, high vegetable diet and increase physical activity  - Colonoscopy for cancer screening   I have personally reviewed and noted the following in the patient's chart:   . Medical and social history . Use of alcohol, tobacco or illicit drugs  . Current medications and supplements . Functional ability and status . Nutritional status . Physical activity . Advanced directives . List of other physicians . Hospitalizations, surgeries, and ER visits in previous 12 months . Vitals . Screenings to include cognitive, depression, and falls . Referrals and appointments  In addition, I have reviewed and discussed with patient certain preventive protocols, quality metrics, and best practice recommendations. A written personalized care plan for preventive services as well as general preventive health recommendations were provided to patient.   Caesar Bookman, NP  09/03/2018

## 2018-09-03 NOTE — Progress Notes (Signed)
Provider: Marlowe Sax FNP-C   , Nelda Bucks, NP  Patient Care Team: , Nelda Bucks, NP as PCP - General (Family Medicine)  Extended Emergency Contact Information Primary Emergency Contact: Glennie [place] Phone: [phone] Relation: Son Secondary Emergency Contact: Amparo [place] Mobile Phone: [phone] Relation: Daughter  Goals of care: Advanced Directive information Advanced Directives 09/03/2018  Does Patient Have a Medical Advance Directive? No  Type of Advance Directive -  Does patient want to make changes to medical advance directive? -  Copy of [place] in Chart? -  Would patient like information on creating a medical advance directive? -     Chief Complaint  Patient presents with  . Medical Management of Chronic Issues    Medication management, needs to talk about oxycodone refill and would like to stop taking zantac and discuss alternative medication  . Form Completion    Patient states she needs form for wheel chair or scooter form completed states she has been fallling frequently   . Quality Metric Gaps    Patient would like info about advanced directives and discuss     HPI:  Pt is a 68 y.o. female seen today for medical management of chronic diseases.she states sustained a fall episode 08/30/2018 though has had multiple other fall episode within her room.latest fall happed outside the house on a drive way.she states three men helped her up.she denies hitting her head or sustaining any injuries.   Hypertension - on metoprolol 50 mg tablet twice daily.she denies any headache,dizziness,shortness of breath or chest pain.  Depression - states worsening depression.currently on Celexa 20 mg tablet daily at bedtime and Elavil 100 mg tablet daily.  Peripheral Neuropathy - no worsening symptoms on gabapentin three capsules twice daily and two capsules at bedtime.  GERD - she has  stopped taking Zantac due to recent recall with concerns of carcinogen in zantac.she states continues to have reflux would like other medication not linked with zantac.  Asthma - states symptoms controlled with albuterol inhaler has not required frequent use.   Chronic pain - reports generalized pain due to fibromyalgia.Takes oxycodone 2-3 times daily for pain.Denies any signs of sedation.      Past Medical History:  Diagnosis Date  . Allergy   . Anxiety   . Arthritis    "99% of my body" (11/11/2016)  . Asthma   . Bursitis of both hips   . Cataract   . Cervical scoliosis   . Chronic back pain    "all over my back" (11/11/2016)  . Depressive disorder, not elsewhere classified   . Enthesopathy of hip region   . Environmental allergies    "I take Claritin qd; 365 days/year" (11/11/2016)  . Essential hypertension, benign   . Fibromyalgia   . Frequent falls   . GERD (gastroesophageal reflux disease)   . Headache    "at least 1/wk; may last for 2 days or so" (11/11/2016)  . History of blood transfusion 1985   w/hysterectomy  . Lumbar stenosis   . Migraine    "a few/month" (11/11/2016)  . Muscle weakness (generalized)   . Myalgia and myositis, unspecified   . Osteoporosis   . Other abnormal blood chemistry   . Other malaise and fatigue   . Raynaud's syndrome   . Sciatic nerve pain, left   . Sciatica   . Unspecified vitamin D deficiency    Past Surgical History:  Procedure Laterality Date  .  ABDOMINAL HYSTERECTOMY  1985  . [place]; 1979  . SHOULDER OPEN ROTATOR CUFF REPAIR Left     No Known Allergies  Allergies as of 09/03/2018   No Known Allergies     Medication List       Accurate as of September 03, 2018  2:03 PM. Always use your most recent med list.        albuterol 108 (90 Base) MCG/ACT inhaler Commonly known as:  Ventolin HFA INHALE 2 PUFFS  EVERY 6 (SIX) HOURS AS NEEDED FOR WHEEZING OR SHORTNESS OF BREATH.   amitriptyline 100 MG tablet Commonly  known as:  ELAVIL Take 1 tablet (100 mg total) by mouth at bedtime. For mood   antiseptic oral rinse Liqd 15 mLs by Mouth Rinse route as needed for dry mouth.   aspirin 81 MG tablet Take 81 mg by mouth daily.   carboxymethylcellulose 0.5 % Soln Commonly known as:  REFRESH PLUS Place 2 drops into both eyes 2 (two) times daily as needed.   citalopram 20 MG tablet Commonly known as:  CELEXA Take 1 tablet (20 mg total) by mouth daily.   fluticasone 50 MCG/ACT nasal spray Commonly known as:  FLONASE Place 2 sprays into both nostrils daily as needed for allergies or rhinitis.   gabapentin 400 MG capsule Commonly known as:  NEURONTIN TAKE 3 CAPSULES TWICE DAILY  AND TAKE 2 CAPSULES AT BEDTIME  FOR  NEUROPATHY   loratadine 10 MG tablet Commonly known as:  CLARITIN Take 10 mg by mouth daily.   methocarbamol 500 MG tablet Commonly known as:  ROBAXIN Take 1 tablet (500 mg total) by mouth 2 (two) times daily. For muscle spasm   metoprolol tartrate 50 MG tablet Commonly known as:  LOPRESSOR TAKE ONE TABLET TWICE DAILY FOR BLOOD PRESSURE   oxyCODONE 15 MG immediate release tablet Commonly known as:  Roxicodone Take 1 tablet (15 mg total) by mouth every 4 (four) hours as needed for pain.   Vitamin D3 50 MCG (2000 UT) Tabs Take by mouth daily.       Review of Systems  Constitutional: Negative for appetite change, chills, fatigue and fever.  HENT: Negative for congestion, postnasal drip, rhinorrhea, sinus pressure, sinus pain, sneezing and sore throat.   Eyes: Positive for visual disturbance. Negative for pain, discharge, redness and itching.  Respiratory: Negative for cough, chest tightness, shortness of breath and wheezing.   Cardiovascular: Negative for chest pain, palpitations and leg swelling.  Gastrointestinal: Negative for abdominal distention, abdominal pain, constipation, diarrhea, nausea and vomiting.  Endocrine: Negative for cold intolerance, heat intolerance,  polydipsia, polyphagia and polyuria.  Genitourinary: Negative for dysuria, flank pain, frequency and urgency.  Musculoskeletal: Positive for arthralgias, gait problem and myalgias.       Reports multiple falls   Skin: Negative for color change, pallor, rash and wound.  Neurological: Negative for dizziness, weakness, light-headedness and headaches.  Hematological: Does not bruise/bleed easily.  Psychiatric/Behavioral: Negative for agitation, confusion and sleep disturbance. The patient is not nervous/anxious.     Immunization History  Administered Date(s) Administered  . Influenza,inj,Quad PF,6+ Mos 05/23/2015, 05/28/2016, 03/24/2018  . Influenza-Unspecified 07/07/2012, 02/28/2014  . Pneumococcal Conjugate-13 07/14/2014  . Pneumococcal Polysaccharide-23 09/11/2017  . Tdap 01/26/2016   Pertinent  Health Maintenance Due  Topic Date Due  . COLONOSCOPY  10/18/2001  . MAMMOGRAM  09/04/2019  . INFLUENZA VACCINE  Completed  . DEXA SCAN  Completed  . PNA vac Low Risk Adult  Completed  Fall Risk  09/03/2018 09/03/2018 03/24/2018 10/30/2017 09/11/2017  Falls in the past year? 1 1 Yes Yes Yes  Number falls in past yr: '1 1 2 '$ or more 2 or more 2 or more  Comment - - - Fallen every day this week  -  Injury with Fall? 0 0 No No No  Comment - - - - -  Risk Factor Category  - - - - -  Follow up - - - - -    Vitals:   09/03/18 1311  BP: 112/64  Pulse: 73  Temp: (!) 97.5 F (36.4 C)  TempSrc: Oral  SpO2: 95%  Weight: 242 lb (109.8 kg)  Height: '5\' 4"'$  (1.626 m)   Body mass index is 41.54 kg/m. Physical Exam Vitals signs reviewed.  Constitutional:      General: She is not in acute distress.    Appearance: She is obese. She is not ill-appearing.  HENT:     Head: Normocephalic.     Right Ear: Tympanic membrane, ear canal and external ear normal. There is no impacted cerumen.     Left Ear: Tympanic membrane, ear canal and external ear normal. There is no impacted cerumen.     Nose: Nose  normal. No congestion or rhinorrhea.     Mouth/Throat:     Mouth: Mucous membranes are moist.     Pharynx: Oropharynx is clear. No oropharyngeal exudate or posterior oropharyngeal erythema.  Eyes:     General: No scleral icterus.       Right eye: No discharge.        Left eye: No discharge.     Extraocular Movements: Extraocular movements intact.     Conjunctiva/sclera: Conjunctivae normal.     Pupils: Pupils are equal, round, and reactive to light.  Neck:     Musculoskeletal: Normal range of motion. No neck rigidity or muscular tenderness.     Vascular: No carotid bruit.  Cardiovascular:     Rate and Rhythm: Normal rate and regular rhythm.     Pulses: Normal pulses.     Heart sounds: Normal heart sounds. No murmur. No friction rub. No gallop.   Pulmonary:     Effort: Pulmonary effort is normal. No respiratory distress.     Breath sounds: Normal breath sounds. No wheezing, rhonchi or rales.  Chest:     Chest wall: No tenderness.  Abdominal:     General: Bowel sounds are normal. There is no distension.     Palpations: Abdomen is soft. There is no mass.     Tenderness: There is no abdominal tenderness. There is no right CVA tenderness, left CVA tenderness, guarding or rebound.  Musculoskeletal:        General: No swelling or tenderness.     Right lower leg: No edema.     Left lower leg: No edema.     Comments: FROM unsteady gait ambulates with Rolator.  Lymphadenopathy:     Cervical: No cervical adenopathy.  Skin:    General: Skin is warm and dry.     Coloration: Skin is not pale.     Findings: No erythema, lesion or rash.  Neurological:     Mental Status: She is alert and oriented to person, place, and time.     Cranial Nerves: No cranial nerve deficit.     Sensory: No sensory deficit.     Motor: No weakness.     Coordination: Coordination normal.     Gait: Gait abnormal.  Psychiatric:  Mood and Affect: Mood normal.        Behavior: Behavior normal.        Thought  Content: Thought content normal.        Judgment: Judgment normal.    Labs reviewed: Recent Labs    09/11/17 0938 12/25/17 0758 03/02/18 2353  NA 140 137 137  K 4.4 4.0 5.0  CL 105 104 102  CO2 '28 24 25  '$ GLUCOSE 90 132* 95  BUN '12 12 12  '$ CREATININE 0.88 1.04* 1.08*  CALCIUM 9.3 8.8* 8.5*   Recent Labs    09/11/17 0938  AST 10  ALT 9  BILITOT 0.6  PROT 6.6   Recent Labs    09/11/17 0938 12/25/17 0758 03/02/18 2353  WBC 5.5 6.3 7.1  NEUTROABS 2,409  --   --   HGB 12.0 11.7* 11.6*  HCT 35.4 38.4 37.6  MCV 82.7 89.1 87.6  PLT 344 388 203   Lab Results  Component Value Date   TSH 2.02 09/11/2017   Lab Results  Component Value Date   HGBA1C 5.4 09/11/2017   Lab Results  Component Value Date   CHOL 178 01/02/2017   HDL 55 01/02/2017   LDLCALC 101 (H) 01/02/2017   TRIG 110 01/02/2017   CHOLHDL 3.2 01/02/2017    Significant Diagnostic Results in last 30 days:  No results found.  Assessment/Plan 1. Essential hypertension B/p and HR stable.continue on metoprolol 50 mg tablet twice daily.On ASA 81 mg tablet for prophylaxis. - CBC with Differential/Platelet - CMP with eGFR([facility])  2. Severe episode of recurrent major depressive disorder, without psychotic features ([place]) Reports worsening depression.No suicide ideation.continue on Celexa 20 mg tablet daily at bedtime and increase Elavil from 75 mg tablet to 100 mg tablet daily. - citalopram (CELEXA) 20 MG tablet; Take 1 tablet (20 mg total) by mouth daily.  Dispense: 90 tablet; Refill: 1 - amitriptyline (ELAVIL) 100 MG tablet; Take 1 tablet (100 mg total) by mouth at bedtime. For mood  Dispense: 90 tablet; Refill: 3 - TSH  3. Idiopathic peripheral neuropathy Symptoms stable.continue on gabapentin. - gabapentin (NEURONTIN) 400 MG capsule; TAKE 3 CAPSULES TWICE DAILY  AND TAKE 2 CAPSULES AT BEDTIME  FOR  NEUROPATHY  Dispense: 720 capsule; Refill: 1  4. Gastroesophageal reflux disease without esophagitis -  has stopped Zantac -start on famotidine 20 mg tablet one by mouth at bedtime.    5. Mild intermittent asthma, unspecified whether complicated Symptoms controlled.bilateral lung sounds clear to auscultation.continue on Albuterol 108 mcg/ACT inhaler every 6 hours as needed for wheezing or shortness of breath.  6. Chronic pain syndrome Requires 2-3 times per day continue on oxycodone 15 mg IR tablet every 4 hours as needed for pain.   7. Fibromyalgia Chronic.continue current pain regimen. - amitriptyline (ELAVIL) 100 MG tablet; Take 1 tablet (100 mg total) by mouth at bedtime. For mood  Dispense: 90 tablet; Refill: 3  8. Morbid obesity due to excess calories ([facility]) Low carbohydrate,low saturated fats and high vegetable diet recommended.exercise limited due to pain.  - Lipid panel - Hemoglobin A1c  9. Unsteady gait Ambulates with Rolator.Request order for motorized wheelchair.Will need physical Therapy evaluation.HH PT ordered this visit.   11. Falling episodes Reports multiple fall episodes.Home health PT to evaluate and treat as indicated for gait stability,exercise,muscle strengthening.  Family/ staff Communication: Reviewed plan of care with patient  Labs/tests ordered:  - CBC with Differential/Platelet - CMP with eGFR([facility]) - TSH - Lipid panel - Hemoglobin A1c  Sandrea Hughs, NP

## 2018-09-03 NOTE — Patient Instructions (Signed)
Melinda [name] , Thank you for taking time to come for your Medicare Wellness Visit. I appreciate your ongoing commitment to your health goals. Please review the following plan we discussed and let me know if I can assist you in the future.   Screening recommendations/referrals: Colonoscopy: scheduled today  Mammogram: up to date  Bone Density: Up to date  Recommended yearly ophthalmology/optometry visit for glaucoma screening and checkup Recommended yearly dental visit for hygiene and checkup  Vaccinations: Influenza vaccine:up to date   Pneumococcal vaccine: Up to date  Tdap vaccine : Due 01/25/2026  Shingles vaccine : Declined    Advanced directives: Information provided   Conditions/risks identified: advance Age > 67, Hypertension,Obesity and sedentary.   Next appointment: 1 year    Preventive Care 43 Years and Older, Female Preventive care refers to lifestyle choices and visits with your health care provider that can promote health and wellness. What does preventive care include?  A yearly physical exam. This is also called an annual well check.  Dental exams once or twice a year.  Routine eye exams. Ask your health care provider how often you should have your eyes checked.  Personal lifestyle choices, including:  Daily care of your teeth and gums.  Regular physical activity.  Eating a healthy diet.  Avoiding tobacco and drug use.  Limiting alcohol use.  Practicing safe sex.  Taking low-dose aspirin every day.  Taking vitamin and mineral supplements as recommended by your health care provider. What happens during an annual well check? The services and screenings done by your health care provider during your annual well check will depend on your age, overall health, lifestyle risk factors, and family history of disease. Counseling  Your health care provider may ask you questions about your:  Alcohol use.  Tobacco use.  Drug use.  Emotional  well-being.  Home and relationship well-being.  Sexual activity.  Eating habits.  History of falls.  Memory and ability to understand (cognition).  Work and work Astronomer.  Reproductive health. Screening  You may have the following tests or measurements:  Height, weight, and BMI.  Blood pressure.  Lipid and cholesterol levels. These may be checked every 5 years, or more frequently if you are over 69 years old.  Skin check.  Lung cancer screening. You may have this screening every year starting at age 58 if you have a 30-pack-year history of smoking and currently smoke or have quit within the past 15 years.  Fecal occult blood test (FOBT) of the stool. You may have this test every year starting at age 59.  Flexible sigmoidoscopy or colonoscopy. You may have a sigmoidoscopy every 5 years or a colonoscopy every 10 years starting at age 80.  Hepatitis C blood test.  Hepatitis B blood test.  Sexually transmitted disease (STD) testing.  Diabetes screening. This is done by checking your blood sugar (glucose) after you have not eaten for a while (fasting). You may have this done every 1-3 years.  Bone density scan. This is done to screen for osteoporosis. You may have this done starting at age 64.  Mammogram. This may be done every 1-2 years. Talk to your health care provider about how often you should have regular mammograms. Talk with your health care provider about your test results, treatment options, and if necessary, the need for more tests. Vaccines  Your health care provider may recommend certain vaccines, such as:  Influenza vaccine. This is recommended every year.  Tetanus, diphtheria, and acellular pertussis (  Tdap, Td) vaccine. You may need a Td booster every 10 years.  Zoster vaccine. You may need this after age 13.  Pneumococcal 13-valent conjugate (PCV13) vaccine. One dose is recommended after age 62.  Pneumococcal polysaccharide (PPSV23) vaccine. One  dose is recommended after age 76. Talk to your health care provider about which screenings and vaccines you need and how often you need them. This information is not intended to replace advice given to you by your health care provider. Make sure you discuss any questions you have with your health care provider. Document Released: 07/13/2015 Document Revised: 03/05/2016 Document Reviewed: 04/17/2015 Elsevier Interactive Patient Education  2017 [place] Prevention in the Home Falls can cause injuries. They can happen to people of all ages. There are many things you can do to make your home safe and to help prevent falls. What can I do on the outside of my home?  Regularly fix the edges of walkways and driveways and fix any cracks.  Remove anything that might make you trip as you walk through a door, such as a raised step or threshold.  Trim any bushes or trees on the path to your home.  Use bright outdoor lighting.  Clear any walking paths of anything that might make someone trip, such as rocks or tools.  Regularly check to see if handrails are loose or broken. Make sure that both sides of any steps have handrails.  Any raised decks and porches should have guardrails on the edges.  Have any leaves, snow, or ice cleared regularly.  Use sand or salt on walking paths during winter.  Clean up any spills in your garage right away. This includes oil or grease spills. What can I do in the bathroom?  Use night lights.  Install grab bars by the toilet and in the tub and shower. Do not use towel bars as grab bars.  Use non-skid mats or decals in the tub or shower.  If you need to sit down in the shower, use a plastic, non-slip stool.  Keep the floor dry. Clean up any water that spills on the floor as soon as it happens.  Remove soap buildup in the tub or shower regularly.  Attach bath mats securely with double-sided non-slip rug tape.  Do not have throw rugs and other  things on the floor that can make you trip. What can I do in the bedroom?  Use night lights.  Make sure that you have a light by your bed that is easy to reach.  Do not use any sheets or blankets that are too big for your bed. They should not hang down onto the floor.  Have a firm chair that has side arms. You can use this for support while you get dressed.  Do not have throw rugs and other things on the floor that can make you trip. What can I do in the kitchen?  Clean up any spills right away.  Avoid walking on wet floors.  Keep items that you use a lot in easy-to-reach places.  If you need to reach something above you, use a strong step stool that has a grab bar.  Keep electrical cords out of the way.  Do not use floor polish or wax that makes floors slippery. If you must use wax, use non-skid floor wax.  Do not have throw rugs and other things on the floor that can make you trip. What can I do with my stairs?  Do  not leave any items on the stairs.  Make sure that there are handrails on both sides of the stairs and use them. Fix handrails that are broken or loose. Make sure that handrails are as long as the stairways.  Check any carpeting to make sure that it is firmly attached to the stairs. Fix any carpet that is loose or worn.  Avoid having throw rugs at the top or bottom of the stairs. If you do have throw rugs, attach them to the floor with carpet tape.  Make sure that you have a light switch at the top of the stairs and the bottom of the stairs. If you do not have them, ask someone to add them for you. What else can I do to help prevent falls?  Wear shoes that:  Do not have high heels.  Have rubber bottoms.  Are comfortable and fit you well.  Are closed at the toe. Do not wear sandals.  If you use a stepladder:  Make sure that it is fully opened. Do not climb a closed stepladder.  Make sure that both sides of the stepladder are locked into place.  Ask  someone to hold it for you, if possible.  Clearly mark and make sure that you can see:  Any grab bars or handrails.  First and last steps.  Where the edge of each step is.  Use tools that help you move around (mobility aids) if they are needed. These include:  Canes.  Walkers.  Scooters.  Crutches.  Turn on the lights when you go into a dark area. Replace any light bulbs as soon as they burn out.  Set up your furniture so you have a clear path. Avoid moving your furniture around.  If any of your floors are uneven, fix them.  If there are any pets around you, be aware of where they are.  Review your medicines with your doctor. Some medicines can make you feel dizzy. This can increase your chance of falling. Ask your doctor what other things that you can do to help prevent falls. This information is not intended to replace advice given to you by your health care provider. Make sure you discuss any questions you have with your health care provider. Document Released: 04/12/2009 Document Revised: 11/22/2015 Document Reviewed: 07/21/2014 Elsevier Interactive Patient Education  2017 [employer].

## 2018-09-04 LAB — LIPID PANEL
Cholesterol: 189 mg/dL (ref <200)
HDL: 58 mg/dL (ref >=50)
LDL Cholesterol (Calc): 112 mg/dL (calc) — ABNORMAL HIGH
Non-HDL Cholesterol (Calc): 131 mg/dL (calc) — ABNORMAL HIGH (ref <130)
Total CHOL/HDL Ratio: 3.3 (calc) (ref <5.0)
Triglycerides: 88 mg/dL (ref <150)

## 2018-09-04 LAB — CBC WITH DIFFERENTIAL/PLATELET
Absolute Monocytes: 864 cells/uL (ref 200–950)
Basophils Absolute: 70 cells/uL (ref 0–200)
Basophils Relative: 1.1 %
Eosinophils Absolute: 230 cells/uL (ref 15–500)
Eosinophils Relative: 3.6 %
HCT: 36.6 % (ref 35.0–45.0)
Hemoglobin: 12 g/dL (ref 11.7–15.5)
Lymphs Abs: 2195 cells/uL (ref 850–3900)
MCH: 27.3 pg (ref 27.0–33.0)
MCHC: 32.8 g/dL (ref 32.0–36.0)
MCV: 83.2 fL (ref 80.0–100.0)
MPV: 11.8 fL (ref 7.5–12.5)
Monocytes Relative: 13.5 %
Neutro Abs: 3040 cells/uL (ref 1500–7800)
Neutrophils Relative %: 47.5 %
Platelets: 368 10*3/uL (ref 140–400)
RBC: 4.4 10*6/uL (ref 3.80–5.10)
RDW: 12.9 % (ref 11.0–15.0)
Total Lymphocyte: 34.3 %
WBC: 6.4 10*3/uL (ref 3.8–10.8)

## 2018-09-04 LAB — HEMOGLOBIN A1C
Hgb A1c MFr Bld: 5.4 % of total Hgb (ref <5.7)
Mean Plasma Glucose: 108 (calc)
eAG (mmol/L): 6 (calc)

## 2018-09-04 LAB — COMPLETE METABOLIC PANEL WITH GFR
AG Ratio: 1.4 (calc) (ref 1.0–2.5)
ALT: 6 U/L (ref 6–29)
AST: 13 U/L (ref 10–35)
Albumin: 4 g/dL (ref 3.6–5.1)
Alkaline phosphatase (APISO): 69 U/L (ref 37–153)
BUN: 10 mg/dL (ref 7–25)
CO2: 28 mmol/L (ref 20–32)
Calcium: 9.3 mg/dL (ref 8.6–10.4)
Chloride: 102 mmol/L (ref 98–110)
Creat: 0.99 mg/dL (ref 0.50–0.99)
GFR, Est African American: 69 mL/min/{1.73_m2} (ref >=60)
GFR, Est Non African American: 59 mL/min/{1.73_m2} — ABNORMAL LOW (ref >=60)
Globulin: 2.9 g/dL (calc) (ref 1.9–3.7)
Glucose, Bld: 101 mg/dL — ABNORMAL HIGH (ref 65–99)
Potassium: 4.6 mmol/L (ref 3.5–5.3)
Sodium: 137 mmol/L (ref 135–146)
Total Bilirubin: 0.6 mg/dL (ref 0.2–1.2)
Total Protein: 6.9 g/dL (ref 6.1–8.1)

## 2018-09-04 LAB — TSH: TSH: 2.96 mIU/L (ref 0.40–4.50)

## 2018-09-07 ENCOUNTER — Other Ambulatory Visit: Payer: Self-pay

## 2018-09-07 DIAGNOSIS — G609 Hereditary and idiopathic neuropathy, unspecified: Secondary | ICD-10-CM

## 2018-09-07 DIAGNOSIS — J4520 Mild intermittent asthma, uncomplicated: Secondary | ICD-10-CM

## 2018-09-07 DIAGNOSIS — R7303 Prediabetes: Secondary | ICD-10-CM

## 2018-09-07 DIAGNOSIS — R296 Repeated falls: Secondary | ICD-10-CM | POA: Diagnosis not present

## 2018-09-07 DIAGNOSIS — R2681 Unsteadiness on feet: Secondary | ICD-10-CM | POA: Diagnosis not present

## 2018-09-07 DIAGNOSIS — I10 Essential (primary) hypertension: Secondary | ICD-10-CM

## 2018-09-07 DIAGNOSIS — M797 Fibromyalgia: Secondary | ICD-10-CM | POA: Diagnosis not present

## 2018-09-07 DIAGNOSIS — K219 Gastro-esophageal reflux disease without esophagitis: Secondary | ICD-10-CM | POA: Diagnosis not present

## 2018-09-07 DIAGNOSIS — F332 Major depressive disorder, recurrent severe without psychotic features: Secondary | ICD-10-CM

## 2018-09-07 DIAGNOSIS — Z79899 Other long term (current) drug therapy: Secondary | ICD-10-CM

## 2018-09-14 DIAGNOSIS — J4520 Mild intermittent asthma, uncomplicated: Secondary | ICD-10-CM | POA: Diagnosis not present

## 2018-09-14 DIAGNOSIS — K219 Gastro-esophageal reflux disease without esophagitis: Secondary | ICD-10-CM | POA: Diagnosis not present

## 2018-09-14 DIAGNOSIS — R2681 Unsteadiness on feet: Secondary | ICD-10-CM | POA: Diagnosis not present

## 2018-09-14 DIAGNOSIS — F332 Major depressive disorder, recurrent severe without psychotic features: Secondary | ICD-10-CM | POA: Diagnosis not present

## 2018-09-14 DIAGNOSIS — R296 Repeated falls: Secondary | ICD-10-CM | POA: Diagnosis not present

## 2018-09-14 DIAGNOSIS — I10 Essential (primary) hypertension: Secondary | ICD-10-CM | POA: Diagnosis not present

## 2018-09-14 DIAGNOSIS — M797 Fibromyalgia: Secondary | ICD-10-CM | POA: Diagnosis not present

## 2018-09-14 DIAGNOSIS — G609 Hereditary and idiopathic neuropathy, unspecified: Secondary | ICD-10-CM | POA: Diagnosis not present

## 2018-09-17 ENCOUNTER — Other Ambulatory Visit: Payer: Self-pay | Admitting: *Deleted

## 2018-09-17 DIAGNOSIS — G609 Hereditary and idiopathic neuropathy, unspecified: Secondary | ICD-10-CM

## 2018-09-17 DIAGNOSIS — M797 Fibromyalgia: Secondary | ICD-10-CM

## 2018-09-17 DIAGNOSIS — M4800 Spinal stenosis, site unspecified: Secondary | ICD-10-CM

## 2018-09-17 DIAGNOSIS — G894 Chronic pain syndrome: Secondary | ICD-10-CM

## 2018-09-17 DIAGNOSIS — M5430 Sciatica, unspecified side: Secondary | ICD-10-CM

## 2018-09-17 MED ORDER — OXYCODONE HCL 15 MG PO TABS: 15.0000 mg | ORAL_TABLET | ORAL | 0 refills | Status: DC | PRN

## 2018-09-17 NOTE — Telephone Encounter (Signed)
Patient requested refill. Patient only received a 23 day supply on 08/27/18.  [place]CCS[place] Database Verified LR: 08/27/2018 #90 Pended Rx and sent to [facility] for approval.

## 2018-09-28 ENCOUNTER — Other Ambulatory Visit: Payer: Self-pay

## 2018-09-28 ENCOUNTER — Telehealth: Payer: Self-pay | Admitting: *Deleted

## 2018-09-28 ENCOUNTER — Ambulatory Visit (INDEPENDENT_AMBULATORY_CARE_PROVIDER_SITE_OTHER): Payer: Medicare HMO | Admitting: Nurse Practitioner

## 2018-09-28 ENCOUNTER — Encounter: Payer: Self-pay | Admitting: Nurse Practitioner

## 2018-09-28 DIAGNOSIS — J4520 Mild intermittent asthma, uncomplicated: Secondary | ICD-10-CM

## 2018-09-28 DIAGNOSIS — J069 Acute upper respiratory infection, unspecified: Secondary | ICD-10-CM

## 2018-09-28 NOTE — Progress Notes (Signed)
This service is provided via telemedicine  No vital signs collected/recorded due to the encounter was a telemedicine visit.   Location of patient (ex: home, work):  Home   Patient consents to a telephone visit:  Yes   Location of the provider (ex: office, home[facility]   Names of all persons participating in the telemedicine service and their role in the encounter:  S.Chrae B/CMA, Abbey ChattersJessica Jalei Shibley, NP, and Patient    Time spent on call:  7 min with CMA   Virtual Visit via Telephone Note  I connected with Metta Clinesebra Boss on 09/28/18 at  3:45 PM EDT by telephone and verified that I am speaking with the correct person using two identifiers.   I discussed the limitations, risks, security and privacy concerns of performing an evaluation and management service by telephone and the availability of in person appointments. I also discussed with the patient that there may be a patient responsible charge related to this service. The patient expressed understanding and agreed to proceed.     Careteam: Patient Care Team: [name], Donalee Citrininah C, NP as PCP - General (Family Medicine)  Advanced Directive information    No Known Allergies  Chief Complaint  Patient presents with  . Acute Visit    Patient c/o sore throat, sweats, and chest congestion (yelllow) x 2-3 days     HPI: Patient is a 68 y.o. female due to not feeling well.  Reports she has been out to [employer]. Using scat and public transportation [facility] to get around the community.  For the last 5 days she has stayed home. Reports she lives in a building with ~300 ppl Reports she is breaking out in sweats, having hot and cold flashes. She does not have a thermometer at her house.  Sore throat  Cough and is hoarse with thick sputum.  Body is hurting all over. Sore to neck and arms.  Having a bad headache  Symptoms started 3 days ago and has gotten worse.  Using pain medication for myalgias  No shortness of  breath.  Reports throat is very dry.  Loss of appetite  Review of Systems:  Review of Systems  Constitutional: Positive for chills, fever and malaise/fatigue.  HENT: Positive for congestion and sore throat. Negative for nosebleeds.   Respiratory: Positive for cough and sputum production. Negative for shortness of breath and wheezing.   Cardiovascular: Negative for chest pain and leg swelling.  Skin: Negative for itching and rash.  Neurological: Positive for headaches. Negative for dizziness.    Past Medical History:  Diagnosis Date  . Allergy   . Anxiety   . Arthritis    "99% of my body" (11/11/2016)  . Asthma   . Bursitis of both hips   . Cataract   . Cervical scoliosis   . Chronic back pain    "all over my back" (11/11/2016)  . Depressive disorder, not elsewhere classified   . Enthesopathy of hip region   . Environmental allergies    "I take Claritin qd; 365 days/year" (11/11/2016)  . Essential hypertension, benign   . Fibromyalgia   . Frequent falls   . GERD (gastroesophageal reflux disease)   . Headache    "at least 1/wk; may last for 2 days or so" (11/11/2016)  . History of blood transfusion 1985   w/hysterectomy  . Lumbar stenosis   . Migraine    "a few/month" (11/11/2016)  . Muscle weakness (generalized)   . Myalgia and myositis, unspecified   .  Osteoporosis   . Other abnormal blood chemistry   . Other malaise and fatigue   . Raynaud's syndrome   . Sciatic nerve pain, left   . Sciatica   . Unspecified vitamin D deficiency    Past Surgical History:  Procedure Laterality Date  . ABDOMINAL HYSTERECTOMY  1985  . CESAREAN SECTION  1976; 1979  . SHOULDER OPEN ROTATOR CUFF REPAIR Left    Social History:   reports that she has never smoked. She has never used smokeless tobacco. She reports that she does not drink alcohol or use drugs.  Family History  Problem Relation Age of Onset  . Dementia Mother   . Heart disease Mother   . Hypertension Mother   .  Diabetes Mother   . Diabetes Sister   . Cancer Sister        lung  . Cancer Other        Nepher (Mothers side)  . Diabetes Maternal Grandmother   . Arthritis Maternal Grandmother   . Diabetes Cousin        Mother's side  . Arthritis Sister   . Arthritis Cousin        Mother's side   . Colon cancer Neg Hx   . Esophageal cancer Neg Hx   . Liver cancer Neg Hx   . Pancreatic cancer Neg Hx   . Rectal cancer Neg Hx   . Stomach cancer Neg Hx     Medications: Patient's Medications  New Prescriptions   No medications on file  Previous Medications   ALBUTEROL (VENTOLIN HFA) 108 (90 BASE) MCG/ACT INHALER    INHALE 2 PUFFS  EVERY 6 (SIX) HOURS AS NEEDED FOR WHEEZING OR SHORTNESS OF BREATH.   AMITRIPTYLINE (ELAVIL) 100 MG TABLET    Take 1 tablet (100 mg total) by mouth at bedtime. For mood   ANTISEPTIC ORAL RINSE (BIOTENE) LIQD    15 mLs by Mouth Rinse route as needed for dry mouth.   ASPIRIN EC 81 MG TABLET    Take 81 mg by mouth daily.   CARBOXYMETHYLCELLULOSE (REFRESH PLUS) 0.5 % SOLN    Place 2 drops into both eyes 2 (two) times daily as needed.   CHOLECALCIFEROL (VITAMIN D3) 2000 UNITS TABS    Take by mouth daily.   CITALOPRAM (CELEXA) 20 MG TABLET    Take 1 tablet (20 mg total) by mouth daily.   FAMOTIDINE (PEPCID) 20 MG TABLET    Take 1 tablet (20 mg total) by mouth at bedtime.   FLUTICASONE (FLONASE) 50 MCG/ACT NASAL SPRAY    Place 2 sprays into both nostrils daily as needed for allergies or rhinitis.   GABAPENTIN (NEURONTIN) 400 MG CAPSULE    TAKE 3 CAPSULES TWICE DAILY  AND TAKE 2 CAPSULES AT BEDTIME  FOR  NEUROPATHY   LORATADINE (CLARITIN) 10 MG TABLET    Take 10 mg by mouth daily.   METHOCARBAMOL (ROBAXIN) 500 MG TABLET    Take 1 tablet (500 mg total) by mouth 2 (two) times daily. For muscle spasm   METOPROLOL TARTRATE (LOPRESSOR) 50 MG TABLET    TAKE ONE TABLET TWICE DAILY FOR BLOOD PRESSURE   OXYCODONE (ROXICODONE) 15 MG IMMEDIATE RELEASE TABLET    Take 1 tablet (15 mg total)  by mouth every 4 (four) hours as needed for pain.  Modified Medications   No medications on file  Discontinued Medications   ASPIRIN 81 MG TABLET    Take 81 mg by mouth daily.  Physical Exam: Unable due to tele-visit.    Labs reviewed: Basic Metabolic Panel: Recent Labs    12/25/17 0758 03/02/18 2353 09/03/18 1457  NA 137 137 137  K 4.0 5.0 4.6  CL 104 102 102  CO2 24 25 28   GLUCOSE 132* 95 101*  BUN 12 12 10   CREATININE 1.04* 1.08* 0.99  CALCIUM 8.8* 8.5* 9.3  TSH  --   --  2.96   Liver Function Tests: Recent Labs    09/03/18 1457  AST 13  ALT 6  BILITOT 0.6  PROT 6.9   No results for input(s): LIPASE, AMYLASE in the last 8760 hours. No results for input(s): AMMONIA in the last 8760 hours. CBC: Recent Labs    12/25/17 0758 03/02/18 2353 09/03/18 1457  WBC 6.3 7.1 6.4  NEUTROABS  --   --  3,040  HGB 11.7* 11.6* 12.0  HCT 38.4 37.6 36.6  MCV 89.1 87.6 83.2  PLT 388 203 368   Lipid Panel: Recent Labs    09/03/18 1457  CHOL 189  HDL 58  LDLCALC 112*  TRIG 88  CHOLHDL 3.3   TSH: Recent Labs    09/03/18 1457  TSH 2.96   A1C: Lab Results  Component Value Date   HGBA1C 5.4 09/03/2018     Assessment/Plan 1. Viral URI Viral URI/COVID-19 like symptoms, educated to stay at home under isolation. Supportive care at this time. To use mucinex by mouth twice daily with full glass of water for congestion, tylenol 500 mg 1-2 tablets every 8 hours as needed, increase water intake. To get a thermometer to monitor temperature and stay quarantine until 72 hours without symptoms and fever free (without tylenol)  Strict precautions given to seek emergency care for worsening of shortness of breath, lethargy, increased confusion or other life threatening symptoms    2. Mild intermittent asthma, unspecified whether complicated At high risk for complications due to hx of asthma, continues to use albuterol PRN. Without shortness of breath or wheezing at this  time but educated to seek medical attention if symptoms worsen.   Janene Harvey. Biagio Borg  [facility] [phone]   Follow Up Instructions:    I discussed the assessment and treatment plan with the patient. The patient was provided an opportunity to ask questions and all were answered. The patient agreed with the plan and demonstrated an understanding of the instructions.   The patient was advised to call back or seek an in-person evaluation if the symptoms worsen or if the condition fails to improve as anticipated.  I provided 14 minutes of non-face-to-face time during this encounter.   Sharon Seller, NP

## 2018-09-28 NOTE — Telephone Encounter (Signed)
Severe ST, Chest Congestion, Headache, Congestion is yellowish/greenish with tint of blood. Has not been out of house. No travel. No fever.   Televisit with Shanda Bumps this afternoon scheduled.

## 2018-09-28 NOTE — Patient Instructions (Addendum)
mucinex by mouth twice daily with full glass of water Increase water intake Tylenol 500 mg 1-2 tablets every 8 hours as needed for fever, body aches, sore throat, headache.  Thermometer take temperature twice daily.  To stay self isolated until you are 72 hours fever free (without tylenol) and symptoms have resolved.   Infection Prevention Recommendations for Individuals Confirmed to have, or Being Evaluated for, 2019 Novel Coronavirus (COVID-19) Infection Who Receive Care at Home  Individuals who are confirmed to have, or are being evaluated for, COVID-19 should follow the prevention steps below until a healthcare provider or local or state health department says they can return to normal activities.  Stay home except to get medical care You should restrict activities outside your home, except for getting medical care. Do not go to work, school, or public areas, and do not use public transportation or taxis.  Call ahead before visiting your doctor Before your medical appointment, call the healthcare provider and tell them that you have, or are being evaluated for, COVID-19 infection. This will help the healthcare provider's office take steps to keep other people from getting infected. Ask your healthcare provider to call the local or state health department.  Monitor your symptoms Seek prompt medical attention if your illness is worsening (e.g., difficulty breathing). Before going to your medical appointment, call the healthcare provider and tell them that you have, or are being evaluated for, COVID-19 infection. Ask your healthcare provider to call the local or state health department.  Wear a facemask You should wear a facemask that covers your nose and mouth when you are in the same room with other people and when you visit a healthcare provider. People who live with or visit you should also wear a facemask while they are in the same room with you.  Separate yourself from other  people in your home As much as possible, you should stay in a different room from other people in your home. Also, you should use a separate bathroom, if available.  Avoid sharing household items You should not share dishes, drinking glasses, cups, eating utensils, towels, bedding, or other items with other people in your home. After using these items, you should wash them thoroughly with soap and water.  Cover your coughs and sneezes Cover your mouth and nose with a tissue when you cough or sneeze, or you can cough or sneeze into your sleeve. Throw used tissues in a lined trash can, and immediately wash your hands with soap and water for at least 20 seconds or use an alcohol-based hand rub.  Wash your [employer] your hands often and thoroughly with soap and water for at least 20 seconds. You can use an alcohol-based hand sanitizer if soap and water are not available and if your hands are not visibly dirty. Avoid touching your eyes, nose, and mouth with unwashed hands.   Prevention Steps for Caregivers and Household Members of Individuals Confirmed to have, or Being Evaluated for, COVID-19 Infection Being Cared for in the Home  If you live with, or provide care at home for, a person confirmed to have, or being evaluated for, COVID-19 infection please follow these guidelines to prevent infection:  Follow healthcare provider's instructions Make sure that you understand and can help the patient follow any healthcare provider instructions for all care.  Provide for the patient's basic needs You should help the patient with basic needs in the home and provide support for getting groceries, prescriptions, and other personal needs.  Monitor the patient's symptoms If they are getting sicker, call his or her medical provider and tell them that the patient has, or is being evaluated for, COVID-19 infection. This will help the healthcare provider's office take steps to keep other people from  getting infected. Ask the healthcare provider to call the local or state health department.  Limit the number of people who have contact with the patient  If possible, have only one caregiver for the patient.  Other household members should stay in another home or place of residence. If this is not possible, they should stay  in another room, or be separated from the patient as much as possible. Use a separate bathroom, if available.  Restrict visitors who do not have an essential need to be in the home.  Keep older adults, very young children, and other sick people away from the patient Keep older adults, very young children, and those who have compromised immune systems or chronic health conditions away from the patient. This includes people with chronic heart, lung, or kidney conditions, diabetes, and cancer.  Ensure good ventilation Make sure that shared spaces in the home have good air flow, such as from an air conditioner or an opened window, weather permitting.  Wash your hands often  Wash your hands often and thoroughly with soap and water for at least 20 seconds. You can use an alcohol based hand sanitizer if soap and water are not available and if your hands are not visibly dirty.  Avoid touching your eyes, nose, and mouth with unwashed hands.  Use disposable paper towels to dry your hands. If not available, use dedicated cloth towels and replace them when they become wet.  Wear a facemask and gloves  Wear a disposable facemask at all times in the room and gloves when you touch or have contact with the patient's blood, body fluids, and/or secretions or excretions, such as sweat, saliva, sputum, nasal mucus, vomit, urine, or feces.  Ensure the mask fits over your nose and mouth tightly, and do not touch it during use.  Throw out disposable facemasks and gloves after using them. Do not reuse.  Wash your hands immediately after removing your facemask and gloves.  If your  personal clothing becomes contaminated, carefully remove clothing and launder. Wash your hands after handling contaminated clothing.  Place all used disposable facemasks, gloves, and other waste in a lined container before disposing them with other household waste.  Remove gloves and wash your hands immediately after handling these items.  Do not share dishes, glasses, or other household items with the patient  Avoid sharing household items. You should not share dishes, drinking glasses, cups, eating utensils, towels, bedding, or other items with a patient who is confirmed to have, or being evaluated for, COVID-19 infection.  After the person uses these items, you should wash them thoroughly with soap and water.  Wash laundry thoroughly  Immediately remove and wash clothes or bedding that have blood, body fluids, and/or secretions or excretions, such as sweat, saliva, sputum, nasal mucus, vomit, urine, or feces, on them.  Wear gloves when handling laundry from the patient.  Read and follow directions on labels of laundry or clothing items and detergent. In general, wash and dry with the warmest temperatures recommended on the label.  Clean all areas the individual has used often  Clean all touchable surfaces, such as counters, tabletops, doorknobs, bathroom fixtures, toilets, phones, keyboards, tablets, and bedside tables, every day. Also, clean any surfaces that  may have blood, body fluids, and/or secretions or excretions on them.  Wear gloves when cleaning surfaces the patient has come in contact with.  Use a diluted bleach solution (e.g., dilute bleach with 1 part bleach and 10 parts water) or a household disinfectant with a label that says EPA-registered for coronaviruses. To make a bleach solution at home, add 1 tablespoon of bleach to 1 quart (4 cups) of water. For a larger supply, add  cup of bleach to 1 gallon (16 cups) of water.  Read labels of cleaning products and follow  recommendations provided on product labels. Labels contain instructions for safe and effective use of the cleaning product including precautions you should take when applying the product, such as wearing gloves or eye protection and making sure you have good ventilation during use of the product.  Remove gloves and wash hands immediately after cleaning.  Monitor yourself for signs and symptoms of illness Caregivers and household members are considered close contacts, should monitor their health, and will be asked to limit movement outside of the home to the extent possible. Follow the monitoring steps for close contacts listed on the symptom monitoring form.   ? If you have additional questions, contact your local health department or call the epidemiologist on call at [phone] (available 24/7). ? This guidance is subject to change. For the most up-to-date guidance from [facility], please refer to their website: [email or web address]

## 2018-09-28 NOTE — Telephone Encounter (Signed)
Patient called and left message on Clinical intake stated that she wanted to speak with someone regarding her symptoms.  Tried calling patient back. LMOM to return call.

## 2018-09-29 ENCOUNTER — Telehealth: Payer: Self-pay

## 2018-09-29 NOTE — Telephone Encounter (Signed)
Patient called back and left message on clinical intake stating that she could not buy a Thermometer because there was none. Stated that she has been taking the Mucinex and Tylenol as directed and it seems to be working. Stated that it is breaking up the mucus in her chest. Stated that she seems to be ok. Stated that if you have anymore advise to please call.

## 2018-09-29 NOTE — Telephone Encounter (Signed)
Left message on voicemail requesting return call. Reason for call: Per Shanda Bumps patient possible exposed to Covid-19 and we need to follow-up with her today 09/29/2018 and Friday 10/01/2018.  Awaiting return call

## 2018-09-29 NOTE — Telephone Encounter (Signed)
Patient called back and stated that she wants to come into the office to be checked and tested for the COVID-19. Stated that her illness was quickly onset. And stated that she has a severe sore throat and Coughing up thick mucus and her head hurts. Mucus is so thick she had to reach in and pull out with a kleenex.  Wants to be seen and tested. Stated that she would like an antibiotic. No fever.  Please Advise.

## 2018-09-29 NOTE — Telephone Encounter (Signed)
Patient notified and agreed.  

## 2018-09-29 NOTE — Telephone Encounter (Signed)
We are not testing for COVID-19, she does have symptoms similar to COVID-19 and there is community transfer of the virus at this time, so she is to act like she has it. She would not need antibiotic but supportive care as we discussed on our tele-visit.  To follow instructions as discussed for symptom management. Continue to stay hydration- increasing water intake and to take mucinex to help break apart the mucous. She may have fever but does not know because she does not have a thermometer that is why it is important to get one so she can follow her temperature.

## 2018-09-29 NOTE — Telephone Encounter (Signed)
To continue current regimen and we will check on her again later this week but to notify if she needs anything. It would be beneficial to get a thermometer if possible but understand there is a limited supply due to COVID-19 pandemic

## 2018-09-30 ENCOUNTER — Telehealth: Payer: Self-pay

## 2018-09-30 NOTE — Telephone Encounter (Signed)
-----   Message from Sharon Seller, NP sent at 09/30/2018  1:27 PM EDT ----- Can you follow up on Melinda [name] today? I am hoping someone is staying with her or checking on her frequently at home. Also discussed with Dr Chales Abrahams and depending on how she is feeling today I will send in antibiotics to pharmacy to cover if she does have a bacterial infection/bronchitis.

## 2018-09-30 NOTE — Telephone Encounter (Signed)
[name] will hold of on antibiotic at this time. Would recommend her doing a nasal rinse/ netty pot daily to help wash the sinuses. Continue mucinex by mouth daily with increase water. We will check back in with her tomorrow to make sure she is still doing okay

## 2018-09-30 NOTE — Telephone Encounter (Signed)
Discussed response with patient, patient verbalized understanding 

## 2018-09-30 NOTE — Telephone Encounter (Signed)
Spoke with patient, patient states she feels a little better. Patient with yellow nasal congestion (a little blood mixed in). Patient denies, fever, sweats, and shortness of breath. Patient following quarantine recommendations

## 2018-10-01 ENCOUNTER — Telehealth: Payer: Self-pay

## 2018-10-01 NOTE — Telephone Encounter (Addendum)
Call placed to patient to follow up with her symptoms following possible exposure to Covid 19.Left voicemail for patient to return call to office.

## 2018-10-04 ENCOUNTER — Encounter: Payer: Self-pay | Admitting: Internal Medicine

## 2018-10-04 ENCOUNTER — Ambulatory Visit (INDEPENDENT_AMBULATORY_CARE_PROVIDER_SITE_OTHER): Payer: Medicare HMO | Admitting: Internal Medicine

## 2018-10-04 ENCOUNTER — Other Ambulatory Visit: Payer: Self-pay

## 2018-10-04 DIAGNOSIS — J45991 Cough variant asthma: Secondary | ICD-10-CM | POA: Diagnosis not present

## 2018-10-04 DIAGNOSIS — R6889 Other general symptoms and signs: Secondary | ICD-10-CM

## 2018-10-04 DIAGNOSIS — G63 Polyneuropathy in diseases classified elsewhere: Secondary | ICD-10-CM | POA: Insufficient documentation

## 2018-10-04 DIAGNOSIS — Z20822 Contact with and (suspected) exposure to covid-19: Secondary | ICD-10-CM

## 2018-10-04 DIAGNOSIS — J069 Acute upper respiratory infection, unspecified: Secondary | ICD-10-CM

## 2018-10-04 NOTE — Progress Notes (Signed)
This service is provided via telemedicine  No vital signs collected/recorded due to the encounter was a telemedicine visit.   Location of patient (ex: home, work):  Home   Patient consents to a telephone visit:  Yes   Location of the provider (ex: office, home):  Office    Names of all persons participating in the telemedicine service and their role in the encounter:  Dr. Bufford Spikes, Elson Clan, CMA,    Time spent on call:  Asher Muir, CMA  Spent 5 minutes on phone.     Provider: Gwenith Spitz. Renato Gails, D.O., C.M.D.  Goals of Care:  Advanced Directives 09/03/2018  Does Patient Have a Medical Advance Directive? No  Type of Advance Directive -  Does patient want to make changes to medical advance directive? -  Copy of Healthcare Power of Attorney in Chart? -  Would patient like information on creating a medical advance directive? (No Data)     Chief Complaint  Patient presents with  . Acute Visit    Nausea, headache, cough and congestion (yellow). Patient denies fever    HPI: Patient is a 68 y.o. female spoke with by phone today for an acute visit for  Her stomach hurts, her head hurts.  She feels so tremendously bad.  Just talking makes her throat hurt.  Wonders how long it goes on before she will feel better.    She's still coughing up yellow mucus.  She ordered a thermometer for $75 and still hasn't gotten it.  She has a cough.  No chills.  Has a whooping like cough.  Her whole body aches around her stomach area.  Tries to eat, but appetite quite poor.  She is not short of breath.  She didn't eat at all yesterday.  She tried some oatmeal today only.  Coffee and tea don't taste right and she normally has those.  Has just been drinking water.  She's been doing mucinex and taking tylenol.  She has all of her other medications.  Reviewed to be careful with taking her other medications and not to be on empty stomach.   She is faithfully using her albuterol.  She takes her bp meds  at night.  She's been staying in and following the quarantine parameters.  We reviewed when it's time to call EMS:  Chest tightness, shortness of breath or fever/chills returning considering she should be starting to make progress toward recovery since this is week two now of her illness.  Past Medical History:  Diagnosis Date  . Allergy   . Anxiety   . Arthritis    "99% of my body" (11/11/2016)  . Asthma   . Bursitis of both hips   . Cataract   . Cervical scoliosis   . Chronic back pain    "all over my back" (11/11/2016)  . Depressive disorder, not elsewhere classified   . Enthesopathy of hip region   . Environmental allergies    "I take Claritin qd; 365 days/year" (11/11/2016)  . Essential hypertension, benign   . Fibromyalgia   . Frequent falls   . GERD (gastroesophageal reflux disease)   . Headache    "at least 1/wk; may last for 2 days or so" (11/11/2016)  . History of blood transfusion 1985   w/hysterectomy  . Lumbar stenosis   . Migraine    "a few/month" (11/11/2016)  . Muscle weakness (generalized)   . Myalgia and myositis, unspecified   . Osteoporosis   . Other abnormal blood  chemistry   . Other malaise and fatigue   . Raynaud's syndrome   . Sciatic nerve pain, left   . Sciatica   . Unspecified vitamin D deficiency     Past Surgical History:  Procedure Laterality Date  . ABDOMINAL HYSTERECTOMY  1985  . CESAREAN SECTION  1976; 1979  . SHOULDER OPEN ROTATOR CUFF REPAIR Left     No Known Allergies  Outpatient Encounter Medications as of 10/04/2018  Medication Sig  . albuterol (VENTOLIN HFA) 108 (90 Base) MCG/ACT inhaler INHALE 2 PUFFS  EVERY 6 (SIX) HOURS AS NEEDED FOR WHEEZING OR SHORTNESS OF BREATH.  Marland Kitchen. amitriptyline (ELAVIL) 100 MG tablet Take 1 tablet (100 mg total) by mouth at bedtime. For mood  . antiseptic oral rinse (BIOTENE) LIQD 15 mLs by Mouth Rinse route as needed for dry mouth.  Marland Kitchen. aspirin EC 81 MG tablet Take 81 mg by mouth daily.  .  carboxymethylcellulose (REFRESH PLUS) 0.5 % SOLN Place 2 drops into both eyes 2 (two) times daily as needed.  . Cholecalciferol (VITAMIN D3) 2000 units TABS Take by mouth daily.  . citalopram (CELEXA) 20 MG tablet Take 1 tablet (20 mg total) by mouth daily.  . famotidine (PEPCID) 20 MG tablet Take 1 tablet (20 mg total) by mouth at bedtime.  . fluticasone (FLONASE) 50 MCG/ACT nasal spray Place 2 sprays into both nostrils daily as needed for allergies or rhinitis.  Marland Kitchen. gabapentin (NEURONTIN) 400 MG capsule TAKE 3 CAPSULES TWICE DAILY  AND TAKE 2 CAPSULES AT BEDTIME  FOR  NEUROPATHY  . loratadine (CLARITIN) 10 MG tablet Take 10 mg by mouth daily.  . methocarbamol (ROBAXIN) 500 MG tablet Take 1 tablet (500 mg total) by mouth 2 (two) times daily. For muscle spasm  . metoprolol tartrate (LOPRESSOR) 50 MG tablet TAKE ONE TABLET TWICE DAILY FOR BLOOD PRESSURE  . oxyCODONE (ROXICODONE) 15 MG immediate release tablet Take 1 tablet (15 mg total) by mouth every 4 (four) hours as needed for pain.   No facility-administered encounter medications on file as of 10/04/2018.     Review of Systems:  Review of Systems  Constitutional: Positive for malaise/fatigue. Negative for chills and fever.       Poor appetite, things don't taste right  HENT: Positive for sore throat.   Eyes: Negative for discharge.  Respiratory: Positive for cough and sputum production. Negative for shortness of breath and wheezing (using albuterol).   Cardiovascular: Negative for chest pain, palpitations and leg swelling.  Gastrointestinal: Positive for abdominal pain and nausea. Negative for constipation, diarrhea and vomiting.  Genitourinary: Negative for dysuria.  Musculoskeletal: Positive for myalgias. Negative for falls and joint pain.  Neurological: Negative for dizziness.    Health Maintenance  Topic Date Due  . COLONOSCOPY  10/18/2001  . INFLUENZA VACCINE  01/29/2019  . MAMMOGRAM  09/04/2019  . TETANUS/TDAP  01/25/2026  .  DEXA SCAN  Completed  . Hepatitis C Screening  Completed  . PNA vac Low Risk Adult  Completed    Physical Exam: This portion of visit could not be done due to non face-to-face visit (telehealth)  Labs reviewed: Basic Metabolic Panel: Recent Labs    12/25/17 0758 03/02/18 2353 09/03/18 1457  NA 137 137 137  K 4.0 5.0 4.6  CL 104 102 102  CO2 24 25 28   GLUCOSE 132* 95 101*  BUN 12 12 10   CREATININE 1.04* 1.08* 0.99  CALCIUM 8.8* 8.5* 9.3  TSH  --   --  2.96  Liver Function Tests: Recent Labs    09/03/18 1457  AST 13  ALT 6  BILITOT 0.6  PROT 6.9   No results for input(s): LIPASE, AMYLASE in the last 8760 hours. No results for input(s): AMMONIA in the last 8760 hours. CBC: Recent Labs    12/25/17 0758 03/02/18 2353 09/03/18 1457  WBC 6.3 7.1 6.4  NEUTROABS  --   --  3,040  HGB 11.7* 11.6* 12.0  HCT 38.4 37.6 36.6  MCV 89.1 87.6 83.2  PLT 388 203 368   Lipid Panel: Recent Labs    09/03/18 1457  CHOL 189  HDL 58  LDLCALC 112*  TRIG 88  CHOLHDL 3.3   Lab Results  Component Value Date   HGBA1C 5.4 09/03/2018    Procedures since last visit: No results found.  Assessment/Plan 1. Viral URI -see #2 -continue conservative care with hydration with water, mucinex, warm humidity, nasal saline  2. Suspected Covid-19 Virus Infection -does seem consistent with this -started around 3/28 based on record so reaching second week -discussed that it may persist for 14 days at this point  -she has poor appetite so encouraged intake to prevent dehydration and worsening weakness -reviewed when to call EMS and what to tell them--shortness of breath, chest pain or recurrence of fever at this point (but has been using tylenol now for her myalgias)  3. Cough variant asthma -continue albuterol inhaler -will avoid prednisone due to some experiences of this worsening symptoms in covid-19  Pt will check back in with [place] tomorrow.    Labs/tests ordered:  No new Next  appt:  11/25/2018 Non face-to-face time spent on televisit:  8 minutes  Kenan Moodie L. Jazmin Vensel, D.O. Geriatrics [facility] [address] [place], [place] [address] Cell Phone (Mon-Fri 8am-5pm):  [phone] On Call:  [phone] & follow prompts after 5pm & weekends Office Phone:  [phone] Office Fax:  [phone]

## 2018-10-04 NOTE — Patient Instructions (Signed)
Please call EMS if you develop chest pain around your chest and ribs, shortness of breath or your fevers return at his stage of your illness.  You should be starting to improve at this point.  Also, let us know if your intake remains very poor--you may need IV fluids.  Continue to hydrate, use tylenol and mucinex and be faithful with your inhaler.  Do your best to eat at least small meals to keep your strength.  Keep [place] posted on your progress.

## 2018-10-04 NOTE — Telephone Encounter (Signed)
Call placed to patient following up with her. left voicemail for patient to return call at her earliest convenience.

## 2018-10-05 ENCOUNTER — Other Ambulatory Visit: Payer: Self-pay

## 2018-10-05 ENCOUNTER — Telehealth: Payer: Self-pay | Admitting: *Deleted

## 2018-10-05 ENCOUNTER — Emergency Department (HOSPITAL_COMMUNITY): Payer: Medicare HMO

## 2018-10-05 ENCOUNTER — Emergency Department (HOSPITAL_COMMUNITY)
Admission: EM | Admit: 2018-10-05 | Discharge: 2018-10-05 | Disposition: A | Discharge status: Home / Self Care | Payer: Medicare HMO | Source: Home / Self Care | Attending: Emergency Medicine | Admitting: Emergency Medicine

## 2018-10-05 ENCOUNTER — Telehealth: Payer: Self-pay

## 2018-10-05 ENCOUNTER — Encounter (HOSPITAL_COMMUNITY): Payer: Self-pay | Admitting: Student

## 2018-10-05 DIAGNOSIS — E873 Alkalosis: Secondary | ICD-10-CM | POA: Diagnosis not present

## 2018-10-05 DIAGNOSIS — I509 Heart failure, unspecified: Secondary | ICD-10-CM | POA: Insufficient documentation

## 2018-10-05 DIAGNOSIS — Z8261 Family history of arthritis: Secondary | ICD-10-CM | POA: Diagnosis not present

## 2018-10-05 DIAGNOSIS — Z833 Family history of diabetes mellitus: Secondary | ICD-10-CM | POA: Diagnosis not present

## 2018-10-05 DIAGNOSIS — R519 Headache, unspecified: Secondary | ICD-10-CM

## 2018-10-05 DIAGNOSIS — R0902 Hypoxemia: Secondary | ICD-10-CM | POA: Diagnosis not present

## 2018-10-05 DIAGNOSIS — R51 Headache: Secondary | ICD-10-CM

## 2018-10-05 DIAGNOSIS — J189 Pneumonia, unspecified organism: Secondary | ICD-10-CM | POA: Diagnosis not present

## 2018-10-05 DIAGNOSIS — Z79899 Other long term (current) drug therapy: Secondary | ICD-10-CM

## 2018-10-05 DIAGNOSIS — R05 Cough: Secondary | ICD-10-CM

## 2018-10-05 DIAGNOSIS — R059 Cough, unspecified: Secondary | ICD-10-CM

## 2018-10-05 DIAGNOSIS — G894 Chronic pain syndrome: Secondary | ICD-10-CM | POA: Diagnosis not present

## 2018-10-05 DIAGNOSIS — J45909 Unspecified asthma, uncomplicated: Secondary | ICD-10-CM

## 2018-10-05 DIAGNOSIS — Z9981 Dependence on supplemental oxygen: Secondary | ICD-10-CM | POA: Insufficient documentation

## 2018-10-05 DIAGNOSIS — R079 Chest pain, unspecified: Secondary | ICD-10-CM | POA: Diagnosis not present

## 2018-10-05 DIAGNOSIS — I272 Pulmonary hypertension, unspecified: Secondary | ICD-10-CM

## 2018-10-05 DIAGNOSIS — Z7982 Long term (current) use of aspirin: Secondary | ICD-10-CM

## 2018-10-05 DIAGNOSIS — I5033 Acute on chronic diastolic (congestive) heart failure: Secondary | ICD-10-CM | POA: Diagnosis not present

## 2018-10-05 DIAGNOSIS — J069 Acute upper respiratory infection, unspecified: Secondary | ICD-10-CM | POA: Diagnosis not present

## 2018-10-05 DIAGNOSIS — R000 Tachycardia, unspecified: Secondary | ICD-10-CM | POA: Diagnosis not present

## 2018-10-05 DIAGNOSIS — Z7951 Long term (current) use of inhaled steroids: Secondary | ICD-10-CM | POA: Diagnosis not present

## 2018-10-05 DIAGNOSIS — J1289 Other viral pneumonia: Secondary | ICD-10-CM | POA: Diagnosis not present

## 2018-10-05 DIAGNOSIS — I10 Essential (primary) hypertension: Secondary | ICD-10-CM | POA: Diagnosis not present

## 2018-10-05 DIAGNOSIS — Z809 Family history of malignant neoplasm, unspecified: Secondary | ICD-10-CM | POA: Diagnosis not present

## 2018-10-05 DIAGNOSIS — R109 Unspecified abdominal pain: Secondary | ICD-10-CM | POA: Insufficient documentation

## 2018-10-05 DIAGNOSIS — J9601 Acute respiratory failure with hypoxia: Secondary | ICD-10-CM | POA: Diagnosis not present

## 2018-10-05 DIAGNOSIS — Z79891 Long term (current) use of opiate analgesic: Secondary | ICD-10-CM | POA: Diagnosis not present

## 2018-10-05 DIAGNOSIS — R457 State of emotional shock and stress, unspecified: Secondary | ICD-10-CM | POA: Diagnosis not present

## 2018-10-05 DIAGNOSIS — Z8249 Family history of ischemic heart disease and other diseases of the circulatory system: Secondary | ICD-10-CM | POA: Diagnosis not present

## 2018-10-05 DIAGNOSIS — I110 Hypertensive heart disease with heart failure: Secondary | ICD-10-CM | POA: Insufficient documentation

## 2018-10-05 DIAGNOSIS — Z209 Contact with and (suspected) exposure to unspecified communicable disease: Secondary | ICD-10-CM | POA: Diagnosis not present

## 2018-10-05 DIAGNOSIS — R0602 Shortness of breath: Secondary | ICD-10-CM | POA: Diagnosis not present

## 2018-10-05 DIAGNOSIS — E876 Hypokalemia: Secondary | ICD-10-CM | POA: Diagnosis not present

## 2018-10-05 LAB — CBC WITH DIFFERENTIAL/PLATELET
Abs Immature Granulocytes: 0.04 10*3/uL (ref 0.00–0.07)
Basophils Absolute: 0 10*3/uL (ref 0.0–0.1)
Basophils Relative: 0 %
Eosinophils Absolute: 0.1 10*3/uL (ref 0.0–0.5)
Eosinophils Relative: 2 %
HCT: 42.1 % (ref 36.0–46.0)
Hemoglobin: 13 g/dL (ref 12.0–15.0)
Immature Granulocytes: 1 %
Lymphocytes Relative: 15 %
Lymphs Abs: 0.9 10*3/uL (ref 0.7–4.0)
MCH: 26.4 pg (ref 26.0–34.0)
MCHC: 30.9 g/dL (ref 30.0–36.0)
MCV: 85.6 fL (ref 80.0–100.0)
Monocytes Absolute: 1.1 10*3/uL — ABNORMAL HIGH (ref 0.1–1.0)
Monocytes Relative: 19 %
Neutro Abs: 3.8 10*3/uL (ref 1.7–7.7)
Neutrophils Relative %: 63 %
Platelets: 203 10*3/uL (ref 150–400)
RBC: 4.92 MIL/uL (ref 3.87–5.11)
RDW: 12.7 % (ref 11.5–15.5)
WBC: 6 10*3/uL (ref 4.0–10.5)
nRBC: 0 % (ref 0.0–0.2)

## 2018-10-05 LAB — COMPREHENSIVE METABOLIC PANEL
ALT: 15 U/L (ref 0–44)
AST: 25 U/L (ref 15–41)
Albumin: 3.2 g/dL — ABNORMAL LOW (ref 3.5–5.0)
Alkaline Phosphatase: 84 U/L (ref 38–126)
Anion gap: 14 (ref 5–15)
BUN: 7 mg/dL — ABNORMAL LOW (ref 8–23)
CO2: 21 mmol/L — ABNORMAL LOW (ref 22–32)
Calcium: 8.7 mg/dL — ABNORMAL LOW (ref 8.9–10.3)
Chloride: 101 mmol/L (ref 98–111)
Creatinine, Ser: 0.83 mg/dL (ref 0.44–1.00)
GFR calc Af Amer: >60 mL/min (ref >60)
GFR calc non Af Amer: >60 mL/min (ref >60)
Glucose, Bld: 93 mg/dL (ref 70–99)
Potassium: 3.3 mmol/L — ABNORMAL LOW (ref 3.5–5.1)
Sodium: 136 mmol/L (ref 135–145)
Total Bilirubin: 1 mg/dL (ref 0.3–1.2)
Total Protein: 7.4 g/dL (ref 6.5–8.1)

## 2018-10-05 LAB — TROPONIN I: Troponin I: <0.03 ng/mL (ref <0.03)

## 2018-10-05 LAB — LIPASE, BLOOD: Lipase: 17 U/L (ref 11–51)

## 2018-10-05 MED ORDER — METOCLOPRAMIDE HCL 5 MG/ML IJ SOLN
10.0000 mg | Freq: Once | INTRAMUSCULAR | Status: AC
  Administered 2018-10-05: 10 mg via INTRAVENOUS
  Filled 2018-10-05: qty 2

## 2018-10-05 MED ORDER — DIPHENHYDRAMINE HCL 50 MG/ML IJ SOLN
12.5000 mg | Freq: Once | INTRAMUSCULAR | Status: AC
  Administered 2018-10-05: 12.5 mg via INTRAVENOUS
  Filled 2018-10-05: qty 1

## 2018-10-05 NOTE — Discharge Planning (Signed)
EDCM spoke with Abbey Chatters, NP regarding qualifying pt for home oxygen.  NP advised that the office utilizes [facility].  EDCM will contact Hildred Alamin of [employer].

## 2018-10-05 NOTE — Telephone Encounter (Signed)
Patient called and stated that she feels like she is going "[place]" fast. Stated she is getting worse instead of better. Stated that now she is having chest pains and SOB and cough has worsened. Stated her stomach is hurting bad. Patient wants to go to the hospital.  I reviewed last OV note. Instructed patient to call 911 to be evaluated per Dr. Ernest Mallick note:  2. Suspected Covid-19 Virus Infection -does seem consistent with this -started around 3/28 based on record so reaching second week -discussed that it may persist for 14 days at this point  -she has poor appetite so encouraged intake to prevent dehydration and worsening weakness -reviewed when to call EMS and what to tell them--shortness of breath, chest pain or recurrence of fever at this point (but has been using tylenol now for her myalgias

## 2018-10-05 NOTE — Telephone Encounter (Signed)
Agree for patient to be evaluated in ED for chest pains and SOB.

## 2018-10-05 NOTE — Care Management (Signed)
I was initially called for admission for this patient as we are unable to obtain home oxygen without admission.  Management has had difficulties getting home oxygen for ER patients.  I then spoke with Abbey Chatters her outpatient nurse practitioner at Dr. Coolidge Breeze office.  She has kindly arranged through her office to order home oxygen which will be delivered here in the hospital for the patient.  Thus patient will be able to return home with oxygen.  She has a pre-existing condition of morbid obesity and pulmonary hypertension and in the face of the recent viral illness she has suffered has developed an oxygen requirement.

## 2018-10-05 NOTE — Telephone Encounter (Signed)
Order signed for pt to get home O2

## 2018-10-05 NOTE — Discharge Planning (Signed)
[facility] consulted by EDP regarding pr needing home oxygen set-up.  [facility] informed EDP that pt can not qualify for home oxygen through ED as it is an acute setting; pt can qualify in observation status or in PCP office.

## 2018-10-05 NOTE — Discharge Instructions (Addendum)
You were seen in the emergency department today for trouble breathing, headache, and abdominal pain.  Your work-up in the emergency department was overall reassuring.  Your labs show that your potassium and calcium were a bit low, this can be replaced with diet supplementation.   At this time your symptoms seem viral in nature.  Please continue your quarantine per discussion with your primary care provider.  Please utilize oxygen at all times.   Would like you to follow-up very closely with your primary care provider within the next 24 to 48 hours.  Return to the emergency department immediately should you experience new or worsening symptoms including but not limited to increased work of breathing, chest pain, inability to keep fluids down, increased pain, fever, or any other concerns.

## 2018-10-05 NOTE — Telephone Encounter (Signed)
Call was placed to patient to follow up with her symptoms.Several messages have been left for patient to return call.Patient has not responded to any of the messages left.I will close this note at this time.

## 2018-10-05 NOTE — ED Provider Notes (Addendum)
[facility] EMERGENCY DEPARTMENT Provider Note   CSN: [id number] Arrival date & time: 10/05/18  1136    History   Chief Complaint Chief Complaint  Patient presents with  . Cough  . Shortness of Breath  . Abdominal Pain  . Headache    HPI Melinda [name] is a 68 y.o. female with a hx of asthma, anxiety, HTN, fibromyalgia, GERD, migraines, obesity, CHF last EF 55-60%, and pulmonary HTN who presents to the ED with multiple complaints. She states she has been feeling poorly for about 10 days now. Notes she has had productive cough which initially had blood streaked sputum which has resolved now coughing up yellow phlegm. With this she has had headache (gradual onset steady progression to frontal area), abdominal pain, and generalized achiness & malaise. She notes that over the past day or so she has started to feel short of breath and have pain in her L chest. Pain in the L chest is constant, worse with deep breathing/coughing, no alleviating factors.  She states she feels like she is overall worsening. She has had multiple calls/telemedicine visits w/ PCP, has tried mucinex & tylenol as the suggested without much change, and as she has gotten worse this has prompted ER visit. Denies fever to me, mentions this to triage. Denies rhinorrhea, ear pain, or sore throat. Denies N/V/D, constipation, or melena. Has had some increased urinary frequency, no dysuria. Denies leg swelling, recent surgery/trauma, recent long travel, hormone use, personal hx of cancer, or hx of DVT/PE. Denies numbness, weakness, or dizziness. Denies known covid 19 exposure.    HPI  Past Medical History:  Diagnosis Date  . Allergy   . Anxiety   . Arthritis    "99% of my body" (11/11/2016)  . Asthma   . Bursitis of both hips   . Cataract   . Cervical scoliosis   . Chronic back pain    "all over my back" (11/11/2016)  . Depressive disorder, not elsewhere classified   . Enthesopathy of hip region   .  Environmental allergies    "I take Claritin qd; 365 days/year" (11/11/2016)  . Essential hypertension, benign   . Fibromyalgia   . Frequent falls   . GERD (gastroesophageal reflux disease)   . Headache    "at least 1/wk; may last for 2 days or so" (11/11/2016)  . History of blood transfusion 1985   w/hysterectomy  . Lumbar stenosis   . Migraine    "a few/month" (11/11/2016)  . Muscle weakness (generalized)   . Myalgia and myositis, unspecified   . Osteoporosis   . Other abnormal blood chemistry   . Other malaise and fatigue   . Raynaud's syndrome   . Sciatic nerve pain, left   . Sciatica   . Unspecified vitamin D deficiency     Patient Active Problem List   Diagnosis Date Noted  . Polyneuropathy in diseases classified elsewhere (HCC) 10/04/2018  . Prediabetes 09/12/2017  . High risk medication use 09/12/2017  . Frequent falls 09/12/2017  . Chronic pain syndrome 08/14/2017  . Gastroesophageal reflux disease 08/14/2017  . Cough variant asthma 03/14/2017  . Pulmonary hypertension (HCC) 03/12/2017  . Chest pain 11/11/2016  . Asthma 11/11/2016  . Atypical chest pain   . Costochondritis   . Chronic congestive heart failure (HCC)   . MDD (major depressive disorder), recurrent episode (HCC) 05/23/2015  . Allergic rhinitis 11/03/2014  . Paresthesia 09/08/2014  . Peripheral neuropathy 07/14/2014  . Morbid obesity due to excess  calories (HCC) 07/14/2014  . Pain in limb 07/13/2014  . Abnormality of gait 07/13/2014  . Fibromyalgia 06/13/2014  . Spinal stenosis 01/27/2013  . HTN (hypertension) 11/30/2012    Past Surgical History:  Procedure Laterality Date  . ABDOMINAL HYSTERECTOMY  1985  . CESAREAN SECTION  1976; 1979  . SHOULDER OPEN ROTATOR CUFF REPAIR Left      OB History   No obstetric history on file.      Home Medications    Prior to Admission medications   Medication Sig Start Date End Date Taking? Authorizing Provider  albuterol (VENTOLIN HFA) 108 (90 Base)  MCG/ACT inhaler INHALE 2 PUFFS  EVERY 6 (SIX) HOURS AS NEEDED FOR WHEEZING OR SHORTNESS OF BREATH. 03/24/18   Kirt Boys, DO  amitriptyline (ELAVIL) 100 MG tablet Take 1 tablet (100 mg total) by mouth at bedtime. For mood 09/03/18   [name], [name] C, NP  antiseptic oral rinse (BIOTENE) LIQD 15 mLs by Mouth Rinse route as needed for dry mouth.    [provider]  aspirin EC 81 MG tablet Take 81 mg by mouth daily.    [provider]  carboxymethylcellulose (REFRESH PLUS) 0.5 % SOLN Place 2 drops into both eyes 2 (two) times daily as needed.    [provider]  Cholecalciferol (VITAMIN D3) 2000 units TABS Take by mouth daily.    [provider]  citalopram (CELEXA) 20 MG tablet Take 1 tablet (20 mg total) by mouth daily. 09/03/18   [name], [name] C, NP  famotidine (PEPCID) 20 MG tablet Take 1 tablet (20 mg total) by mouth at bedtime. 09/03/18   [name], [name] C, NP  fluticasone (FLONASE) 50 MCG/ACT nasal spray Place 2 sprays into both nostrils daily as needed for allergies or rhinitis. 03/24/18   Kirt Boys, DO  gabapentin (NEURONTIN) 400 MG capsule TAKE 3 CAPSULES TWICE DAILY  AND TAKE 2 CAPSULES AT BEDTIME  FOR  NEUROPATHY 09/03/18   [name], [name] C, NP  loratadine (CLARITIN) 10 MG tablet Take 10 mg by mouth daily.    [provider]  methocarbamol (ROBAXIN) 500 MG tablet Take 1 tablet (500 mg total) by mouth 2 (two) times daily. For muscle spasm 03/24/18   Kirt Boys, DO  metoprolol tartrate (LOPRESSOR) 50 MG tablet TAKE ONE TABLET TWICE DAILY FOR BLOOD PRESSURE 03/24/18   Kirt Boys, DO  oxyCODONE (ROXICODONE) 15 MG immediate release tablet Take 1 tablet (15 mg total) by mouth every 4 (four) hours as needed for pain. 09/17/18   [name], [name], NP    Family History Family History  Problem Relation Age of Onset  . Dementia Mother   . Heart disease Mother   . Hypertension Mother   . Diabetes Mother   . Diabetes Sister   . Cancer Sister         lung  . Cancer Other        Nepher (Mothers side)  . Diabetes Maternal Grandmother   . Arthritis Maternal Grandmother   . Diabetes Cousin        Mother's side  . Arthritis Sister   . Arthritis Cousin        Mother's side   . Colon cancer Neg Hx   . Esophageal cancer Neg Hx   . Liver cancer Neg Hx   . Pancreatic cancer Neg Hx   . Rectal cancer Neg Hx   . Stomach cancer Neg Hx     Social History Social History   Tobacco Use  .  Smoking status: Never Smoker  . Smokeless tobacco: Never Used  Substance Use Topics  . Alcohol use: No    Alcohol/week: 0.0 standard drinks  . Drug use: No     Allergies   Patient has no known allergies.   Review of Systems Review of Systems  Constitutional: Negative for chills and fever.  HENT: Negative for congestion, ear pain and sore throat.   Eyes: Negative for visual disturbance.  Respiratory: Positive for cough and shortness of breath.   Cardiovascular: Positive for chest pain. Negative for leg swelling.  Gastrointestinal: Positive for abdominal pain. Negative for diarrhea, nausea and vomiting.  Musculoskeletal: Positive for myalgias.  Neurological: Positive for headaches. Negative for dizziness, tremors, seizures, syncope, facial asymmetry, speech difficulty, weakness, light-headedness and numbness.  All other systems reviewed and are negative.    Physical Exam Updated Vital Signs BP 97/67 (BP Location: Right Arm)   Pulse 90   Temp 98.2 F (36.8 C) (Oral)   Resp 16   SpO2 92%   Physical Exam Vitals signs and nursing note reviewed.  Constitutional:      General: She is not in acute distress.    Appearance: She is well-developed. She is not toxic-appearing.  HENT:     Head: Normocephalic and atraumatic.  Eyes:     General:        Right eye: No discharge.        Left eye: No discharge.     Conjunctiva/sclera: Conjunctivae normal.  Neck:     Musculoskeletal: Neck supple. No neck rigidity.  Cardiovascular:     Rate and  Rhythm: Normal rate and regular rhythm.     Pulses:          Radial pulses are 2+ on the right side and 2+ on the left side.  Pulmonary:     Breath sounds: Decreased breath sounds (poor air movement throughout, somewhat worse at the bases) present. No wheezing, rhonchi or rales.     Comments: Speaking in full sentences.  SpO2 87-92 % on RA. Given desaturations placed on 2L via [place] with improvement to 95%.  Chest:     Chest wall: Tenderness (L anterior chest wall without overlying skin changes or palpable crepitus) present.  Abdominal:     General: There is no distension.     Palpations: Abdomen is soft.     Tenderness: There is no abdominal tenderness. There is no guarding or rebound.  Musculoskeletal:     Right lower leg: No edema.     Left lower leg: No edema.  Skin:    General: Skin is warm and dry.     Findings: No rash.  Neurological:     Mental Status: She is alert.     Comments: Clear speech. No facial droop. CNIII-XII grossly intact. Bilateral upper and lower extremities' sensation grossly intact. 5/5 symmetric strength with grip strength and with plantar and dorsi flexion bilaterall . Normal finger to nose bilaterally. Negative pronator drift.   Psychiatric:        Behavior: Behavior normal.    ED Treatments / Results  Labs (all labs ordered are listed, but only abnormal results are displayed) Labs Reviewed  CBC WITH DIFFERENTIAL/PLATELET - Abnormal; Notable for the following components:      Result Value   Monocytes Absolute 1.1 (*)    All other components within normal limits  COMPREHENSIVE METABOLIC PANEL - Abnormal; Notable for the following components:   Potassium 3.3 (*)    CO2 21 (*)  BUN 7 (*)    Calcium 8.7 (*)    Albumin 3.2 (*)    All other components within normal limits  LIPASE, BLOOD  TROPONIN I  URINALYSIS, ROUTINE W REFLEX MICROSCOPIC    EKG EKG Interpretation  Date/Time:  Tuesday October 05 2018 11:42:30 EDT Ventricular Rate:  95 PR  Interval:    QRS Duration: 97 QT Interval:  386 QTC Calculation: 486 R Axis:   -16 Text Interpretation:  Sinus rhythm Nonspecific ST abnormality Borderline prolonged QT interval Confirmed by Cathren LaineSteinl, Kevin ([id number]) on 10/05/2018 11:45:44 AM   Radiology Dg Chest Portable 1 View  Result Date: 10/05/2018 CLINICAL DATA:  9-10 days ago had body aches and a "Light fever" with a cough, hx of asthma, called her PCP and they told her she may have covid and to self quarantine, has been doing that for 8 days and has not gotten better, using albuterol inhaler without improvement. EXAM: PORTABLE CHEST 1 VIEW COMPARISON:  03/03/2018 FINDINGS: Cardiac silhouette is normal in size. No mediastinal or hilar masses. There is no evidence of adenopathy. Lungs show prominent bronchovascular markings. There is linear opacity in the mid lungs and lung bases consistent with atelectasis and/or scarring. A component of atelectasis is favored since there has been an increase since the prior radiographs. No lung consolidation. No convincing pleural effusion and no pneumothorax. Skeletal structures are grossly intact. IMPRESSION: 1. No convincing pneumonia. 2. Linear/reticular type opacities in the lung bases and mid lungs consistent with scarring and atelectasis. Chronic prominence of the bronchovascular markings bilaterally. Electronically Signed   By: Amie Portlandavid  Ormond M.D.   On: 10/05/2018 12:23    Procedures Procedures (including critical care time)  Medications Ordered in ED Medications  diphenhydrAMINE (BENADRYL) injection 12.5 mg (12.5 mg Intravenous Given 10/05/18 1228)  metoCLOPramide (REGLAN) injection 10 mg (10 mg Intravenous Given 10/05/18 1228)     Prior Procedures:  Echo 01/23/2017 Study Conclusions  - Left ventricle: The cavity size was normal. Systolic function was   normal. The estimated ejection fraction was in the range of 55%   to 60%. Wall motion was normal; there were no regional wall   motion  abnormalities. Features are consistent with a pseudonormal   left ventricular filling pattern, with concomitant abnormal   relaxation and increased filling pressure (grade 2 diastolic   dysfunction). - Mitral valve: There was mild regurgitation. - Left atrium: Anterior-posterior dimension: 42 mm. - Pulmonary arteries: PA peak pressure: 46 mm Hg (S).  Impressions:  - The right ventricular systolic pressure was increased consistent   with moderate pulmonary hypertension.  Initial Impression / Assessment and Plan / ED Course  I have reviewed the triage vital signs and the nursing notes.  Pertinent labs & imaging results that were available during my care of the patient were reviewed by me and considered in my medical decision making (see chart for details).   Patient presents to the ED with multiple complaints including headache, cough, dyspnea, chest pain, and abdominal pain. Nontoxic appearing, vitals WNL with the exception of mildly low O2 ranging 87-92 on RA improved to 95-100% on 2L via [place] initially. No focal neuro deficits. Breath sounds decreased throughout. Non-tender abdomen without peritoneal signs. Will proceed w/ labs, CXR, EKG, and reglan/benadryl for symptoms.   Work-up reviewed:  CBC: No leukocytosis/leukopenia. No anemia.  CMP: Mild hypokalemia @ 3.3. Mild hypocalcemia @ 8.7. LFTs & renal function without significant disturbance.  Lipase: WNL UA: Pending Troponin: Negative EKG: Nonspecific ST changes. No STEMI.  CXR: IMPRESSION: 1. No convincing pneumonia. 2. Linear/reticular type opacities in the lung bases and mid lungs consistent with scarring and atelectasis. Chronic prominence of the bronchovascular markings bilaterally  HA & Abdominal pain improved following reglan/benadryl. No focal neuro deficits to suggest stroke, ongoing for 10 days SAH seems less likely, no meningismus. Abdomen is nontender without peritoneal signs- doubt appendicitis, cholecystitis,  pancreatitis, diverticulitis, obstruction/perf. Chest pain seems more pleuritic/MSK with reproducibility on chest wall palpation in setting of excess coughing, also considered ACS- trop negative no STEMI, considered PE & evaluation with d-dimer as well- discussed w/ attending Dr. Denton Lank who feels more likely viral respiratory issue therefore this was not ordered & felt to be less likely, I am in agreement.     Patient feeling improved following reglan/benadryl, tolerating PO.   Patient removed from supplemental oxygen, initially 90-93% then desaturated to 85% on RA with good pleth for several minutes, returned to >95% on 2L via [place]. Plan to consult hospitalist service for admission given new oxygen requirement in setting of likely  viral respiratory illness to receive supportive care and appropriate monitoring in the hospital. Considering covid 19, no recent travel/ confirmed exposures, does not meet current ED protocol for testing, will defer to the hospitalist service..   13:54: CONSULT: Discussed case with hospitalist Dr. Willette Pa. She states she does not feel patient requires admission to the hospital.  She has recommended we discharge the patient home with supplemental oxygen & consult case management to coordinate this. Given patient was not accepted by hospitalist service consult to case management has been placed.   14:10: CONSULT: Discussed with Oletta Cohn RN with Case management regarding possibly oxygen coordination.    14:26: CONSULT: Re-discussed with Dr. Willette Pa who has discussed with patient's PCP who can assist in setting this up. Pending care coordination for further plan at this time.   15:10: CONSULT: Re-discussed with case management who has coordinated with PCP, oxygen to be delivered to the ER. Patient feels comfortable with this plan and would like to go home.    16:00: Patient signed out to Huntley Dec PA-C at change of shift pending oxygen delivery- ambulatory SpO2 on 2L of  oxygen, urinalysis, re-eval, and disposition.   This is a shared visit with supervising physician Dr. Denton Lank who has independently evaluated patient & provided guidance in evaluation/management/disposition, in agreement with care   Vindhya Varma was evaluated in Emergency Department on 10/05/2018 for the symptoms described in the history of present illness. He/she was evaluated in the context of the global COVID-19 pandemic, which necessitated consideration that the patient might be at risk for infection with the SARS-CoV-2 virus that causes COVID-19. Institutional protocols and algorithms that pertain to the evaluation of patients at risk for COVID-19 are in a state of rapid change based on information released by regulatory bodies including the CDC and federal and state organizations. These policies and algorithms were followed during the patient's care in the ED.  Final Clinical Impressions(s) / ED Diagnoses   Final diagnoses:  Cough  Abdominal pain, unspecified abdominal location  Acute nonintractable headache, unspecified headache type  Dependence on supplemental oxygen    ED Discharge Orders    None       Cherly Anderson, PA-C 10/05/18 [address], [place], PA-C 10/05/18 1744    Cathren Laine, MD 10/05/18 1954

## 2018-10-05 NOTE — ED Provider Notes (Signed)
Patient taken in sign out from PA Petrucelli at shift change.  The patient is here with viral-like symptoms.  She has a history of CHF, pulmonary hypertension and has a new oxygen requirement.  Patient was ambulated with her oxygen tank after this was delivered for by home health.  Patient had poor Plath and waveform but apparently when she stopped had a mild episode of hypoxia and had some labored breathing.  This was tried again on 4 L with ambulation and she improved.  She is fine at rest on 2 L.  Patient may follow-up with her primary care physician appears appropriate for discharge at this time as per her plan    Arthor Captain, PA-C 10/06/18 0005    Virgina Norfolk, DO 10/06/18 1458

## 2018-10-05 NOTE — ED Notes (Signed)
Pt removed from O2 by provider. Noted to be 81% with good pleth on monitor. Placed back on 3L by this RN with improvement to 93%.

## 2018-10-05 NOTE — ED Triage Notes (Signed)
9-10 days ago had body aches and a "Light fever" with a cough, hx of asthma, called her PCP and they told her she may have covid and to self quarantine, has been doing  that for 8 days and has not gotten better, using albuterol inhaler with no improvement, lung sounds clear with EMS, reports cp when coughing x 1 hour ago. Reports everything tastes like metal 4 asa with EMS.

## 2018-10-05 NOTE — Telephone Encounter (Signed)
Spoke with Melinda [name] from [place]. Melinda [name] called to advise on the process for placing order in Epic for oxygen therapy. Patient recently seen in ER and qualifies for oxygen.   Order pended for signature from Abbey Chatters, NP

## 2018-10-05 NOTE — Discharge Planning (Signed)
DME oxygen being ordred by PCP office.  [facility] will deliver DME to pt room prior to discharge home.

## 2018-10-06 ENCOUNTER — Encounter (HOSPITAL_COMMUNITY): Payer: Self-pay | Admitting: Emergency Medicine

## 2018-10-06 ENCOUNTER — Other Ambulatory Visit: Payer: Self-pay

## 2018-10-06 ENCOUNTER — Emergency Department (HOSPITAL_COMMUNITY): Payer: Medicare HMO

## 2018-10-06 ENCOUNTER — Inpatient Hospital Stay (HOSPITAL_COMMUNITY)
Admission: EM | Admit: 2018-10-06 | Discharge: 2018-10-12 | DRG: 177 | Disposition: A | Discharge status: Home / Self Care | Payer: Medicare HMO | Attending: Internal Medicine | Admitting: Internal Medicine

## 2018-10-06 DIAGNOSIS — E876 Hypokalemia: Secondary | ICD-10-CM | POA: Diagnosis present

## 2018-10-06 DIAGNOSIS — E873 Alkalosis: Secondary | ICD-10-CM

## 2018-10-06 DIAGNOSIS — R0602 Shortness of breath: Secondary | ICD-10-CM | POA: Diagnosis not present

## 2018-10-06 DIAGNOSIS — Z809 Family history of malignant neoplasm, unspecified: Secondary | ICD-10-CM | POA: Diagnosis not present

## 2018-10-06 DIAGNOSIS — R0902 Hypoxemia: Secondary | ICD-10-CM | POA: Diagnosis not present

## 2018-10-06 DIAGNOSIS — Z7982 Long term (current) use of aspirin: Secondary | ICD-10-CM

## 2018-10-06 DIAGNOSIS — Z79891 Long term (current) use of opiate analgesic: Secondary | ICD-10-CM

## 2018-10-06 DIAGNOSIS — J9601 Acute respiratory failure with hypoxia: Secondary | ICD-10-CM | POA: Diagnosis present

## 2018-10-06 DIAGNOSIS — J069 Acute upper respiratory infection, unspecified: Secondary | ICD-10-CM | POA: Diagnosis not present

## 2018-10-06 DIAGNOSIS — I110 Hypertensive heart disease with heart failure: Secondary | ICD-10-CM | POA: Diagnosis present

## 2018-10-06 DIAGNOSIS — Z8249 Family history of ischemic heart disease and other diseases of the circulatory system: Secondary | ICD-10-CM | POA: Diagnosis not present

## 2018-10-06 DIAGNOSIS — I272 Pulmonary hypertension, unspecified: Secondary | ICD-10-CM | POA: Diagnosis present

## 2018-10-06 DIAGNOSIS — G894 Chronic pain syndrome: Secondary | ICD-10-CM | POA: Diagnosis present

## 2018-10-06 DIAGNOSIS — J1289 Other viral pneumonia: Secondary | ICD-10-CM | POA: Diagnosis present

## 2018-10-06 DIAGNOSIS — J45909 Unspecified asthma, uncomplicated: Secondary | ICD-10-CM | POA: Diagnosis present

## 2018-10-06 DIAGNOSIS — Z79899 Other long term (current) drug therapy: Secondary | ICD-10-CM

## 2018-10-06 DIAGNOSIS — Z7951 Long term (current) use of inhaled steroids: Secondary | ICD-10-CM | POA: Diagnosis not present

## 2018-10-06 DIAGNOSIS — Z833 Family history of diabetes mellitus: Secondary | ICD-10-CM

## 2018-10-06 DIAGNOSIS — J189 Pneumonia, unspecified organism: Secondary | ICD-10-CM | POA: Diagnosis present

## 2018-10-06 DIAGNOSIS — I5033 Acute on chronic diastolic (congestive) heart failure: Secondary | ICD-10-CM | POA: Diagnosis not present

## 2018-10-06 DIAGNOSIS — Z8261 Family history of arthritis: Secondary | ICD-10-CM

## 2018-10-06 LAB — CBC WITH DIFFERENTIAL/PLATELET
Abs Immature Granulocytes: 0.08 10*3/uL — ABNORMAL HIGH (ref 0.00–0.07)
Basophils Absolute: 0 10*3/uL (ref 0.0–0.1)
Basophils Relative: 1 %
Eosinophils Absolute: 0.1 10*3/uL (ref 0.0–0.5)
Eosinophils Relative: 1 %
HCT: 42 % (ref 36.0–46.0)
Hemoglobin: 13.3 g/dL (ref 12.0–15.0)
Immature Granulocytes: 1 %
Lymphocytes Relative: 11 %
Lymphs Abs: 0.8 10*3/uL (ref 0.7–4.0)
MCH: 27.4 pg (ref 26.0–34.0)
MCHC: 31.7 g/dL (ref 30.0–36.0)
MCV: 86.6 fL (ref 80.0–100.0)
Monocytes Absolute: 1.2 10*3/uL — ABNORMAL HIGH (ref 0.1–1.0)
Monocytes Relative: 17 %
Neutro Abs: 5.1 10*3/uL (ref 1.7–7.7)
Neutrophils Relative %: 69 %
Platelets: 233 10*3/uL (ref 150–400)
RBC: 4.85 MIL/uL (ref 3.87–5.11)
RDW: 13.1 % (ref 11.5–15.5)
WBC Morphology: ABNORMAL
WBC: 7.4 10*3/uL (ref 4.0–10.5)
nRBC: 0 % (ref 0.0–0.2)

## 2018-10-06 LAB — RESPIRATORY PANEL BY PCR

## 2018-10-06 LAB — CREATININE, SERUM
Creatinine, Ser: 0.87 mg/dL (ref 0.44–1.00)
GFR calc Af Amer: >60 mL/min (ref >60)
GFR calc non Af Amer: >60 mL/min (ref >60)

## 2018-10-06 LAB — BLOOD GAS, ARTERIAL
Acid-base deficit: 1.5 mmol/L (ref 0.0–2.0)
Bicarbonate: 21 mmol/L (ref 20.0–28.0)
Drawn by: 225631
O2 Content: 5 L/min
O2 Saturation: 92.3 %
Patient temperature: 98.4
RATE: 18 resp/min
pCO2 arterial: 30.1 mmHg — ABNORMAL LOW (ref 32.0–48.0)
pH, Arterial: 7.458 — ABNORMAL HIGH (ref 7.350–7.450)
pO2, Arterial: 62.2 mmHg — ABNORMAL LOW (ref 83.0–108.0)

## 2018-10-06 LAB — PROCALCITONIN: Procalcitonin: <0.1 ng/mL

## 2018-10-06 LAB — BASIC METABOLIC PANEL
Anion gap: 12 (ref 5–15)
BUN: 11 mg/dL (ref 8–23)
CO2: 24 mmol/L (ref 22–32)
Calcium: 8.5 mg/dL — ABNORMAL LOW (ref 8.9–10.3)
Chloride: 99 mmol/L (ref 98–111)
Creatinine, Ser: 0.88 mg/dL (ref 0.44–1.00)
GFR calc Af Amer: >60 mL/min (ref >60)
GFR calc non Af Amer: >60 mL/min (ref >60)
Glucose, Bld: 130 mg/dL — ABNORMAL HIGH (ref 70–99)
Potassium: 3.2 mmol/L — ABNORMAL LOW (ref 3.5–5.1)
Sodium: 135 mmol/L (ref 135–145)

## 2018-10-06 LAB — FERRITIN: Ferritin: 264 ng/mL (ref 11–307)

## 2018-10-06 LAB — C-REACTIVE PROTEIN: CRP: 18.2 mg/dL — ABNORMAL HIGH (ref <1.0)

## 2018-10-06 LAB — D-DIMER, QUANTITATIVE: D-Dimer, Quant: 0.41 ug/mL-FEU (ref 0.00–0.50)

## 2018-10-06 LAB — BRAIN NATRIURETIC PEPTIDE: B Natriuretic Peptide: 34.6 pg/mL (ref 0.0–100.0)

## 2018-10-06 LAB — TROPONIN I: Troponin I: 0.05 ng/mL (ref <0.03)

## 2018-10-06 LAB — LACTATE DEHYDROGENASE: LDH: 283 U/L — ABNORMAL HIGH (ref 98–192)

## 2018-10-06 LAB — INFLUENZA PANEL BY PCR (TYPE A & B)
Influenza A By PCR: NEGATIVE
Influenza B By PCR: NEGATIVE

## 2018-10-06 MED ORDER — SODIUM CHLORIDE 0.9 % IV SOLN
INTRAVENOUS | Status: DC
  Administered 2018-10-06: 06:00:00 via INTRAVENOUS

## 2018-10-06 MED ORDER — CITALOPRAM HYDROBROMIDE 20 MG PO TABS
20.0000 mg | ORAL_TABLET | Freq: Every day | ORAL | Status: DC
  Administered 2018-10-06 – 2018-10-12 (×7): 20 mg via ORAL
  Filled 2018-10-06 (×7): qty 1

## 2018-10-06 MED ORDER — ZINC SULFATE 220 (50 ZN) MG PO CAPS
220.0000 mg | ORAL_CAPSULE | Freq: Every day | ORAL | Status: DC
  Administered 2018-10-06 – 2018-10-12 (×7): 220 mg via ORAL
  Filled 2018-10-06 (×7): qty 1

## 2018-10-06 MED ORDER — GABAPENTIN 400 MG PO CAPS
1200.0000 mg | ORAL_CAPSULE | Freq: Two times a day (BID) | ORAL | Status: DC
  Administered 2018-10-06 – 2018-10-12 (×13): 1200 mg via ORAL
  Filled 2018-10-06 (×13): qty 3

## 2018-10-06 MED ORDER — ENOXAPARIN SODIUM 60 MG/0.6ML ~~LOC~~ SOLN
50.0000 mg | SUBCUTANEOUS | Status: DC
  Administered 2018-10-07 – 2018-10-12 (×6): 50 mg via SUBCUTANEOUS
  Filled 2018-10-06 (×7): qty 0.6

## 2018-10-06 MED ORDER — SODIUM CHLORIDE 0.9 % IV SOLN
500.0000 mg | INTRAVENOUS | Status: DC
  Administered 2018-10-07 – 2018-10-11 (×5): 500 mg via INTRAVENOUS
  Filled 2018-10-06 (×5): qty 500

## 2018-10-06 MED ORDER — SODIUM CHLORIDE 0.9 % IV SOLN
1.0000 g | Freq: Once | INTRAVENOUS | Status: AC
  Administered 2018-10-06: 1 g via INTRAVENOUS
  Filled 2018-10-06: qty 10

## 2018-10-06 MED ORDER — GABAPENTIN 400 MG PO CAPS: 800.0000 mg | ORAL_CAPSULE | ORAL | Status: DC

## 2018-10-06 MED ORDER — ALBUTEROL SULFATE HFA 108 (90 BASE) MCG/ACT IN AERS
2.0000 | INHALATION_SPRAY | Freq: Four times a day (QID) | RESPIRATORY_TRACT | Status: DC | PRN
  Filled 2018-10-06: qty 6.7

## 2018-10-06 MED ORDER — GABAPENTIN 400 MG PO CAPS
800.0000 mg | ORAL_CAPSULE | Freq: Every day | ORAL | Status: DC
  Administered 2018-10-06 – 2018-10-11 (×6): 800 mg via ORAL
  Filled 2018-10-06 (×6): qty 2

## 2018-10-06 MED ORDER — ENOXAPARIN SODIUM 40 MG/0.4ML ~~LOC~~ SOLN
40.0000 mg | SUBCUTANEOUS | Status: DC
  Administered 2018-10-06: 40 mg via SUBCUTANEOUS
  Filled 2018-10-06: qty 0.4

## 2018-10-06 MED ORDER — ASPIRIN EC 81 MG PO TBEC
81.0000 mg | DELAYED_RELEASE_TABLET | Freq: Every day | ORAL | Status: DC
  Administered 2018-10-06 – 2018-10-12 (×7): 81 mg via ORAL
  Filled 2018-10-06 (×7): qty 1

## 2018-10-06 MED ORDER — VITAMIN D3 25 MCG (1000 UNIT) PO TABS
2000.0000 [IU] | ORAL_TABLET | Freq: Every day | ORAL | Status: DC
  Administered 2018-10-06 – 2018-10-12 (×7): 2000 [IU] via ORAL
  Filled 2018-10-06 (×8): qty 2

## 2018-10-06 MED ORDER — HYDROXYCHLOROQUINE SULFATE 200 MG PO TABS
200.0000 mg | ORAL_TABLET | Freq: Two times a day (BID) | ORAL | Status: AC
  Administered 2018-10-07 – 2018-10-10 (×7): 200 mg via ORAL
  Filled 2018-10-06 (×7): qty 1

## 2018-10-06 MED ORDER — FAMOTIDINE 20 MG PO TABS
20.0000 mg | ORAL_TABLET | Freq: Every day | ORAL | Status: DC
  Administered 2018-10-06 – 2018-10-11 (×6): 20 mg via ORAL
  Filled 2018-10-06 (×6): qty 1

## 2018-10-06 MED ORDER — SODIUM CHLORIDE 0.9 % IV SOLN: 1.0000 g | INTRAVENOUS | Status: DC

## 2018-10-06 MED ORDER — FLUTICASONE PROPIONATE 50 MCG/ACT NA SUSP
2.0000 | Freq: Every day | NASAL | Status: DC | PRN
  Administered 2018-10-08: 2 via NASAL
  Filled 2018-10-06 (×2): qty 16

## 2018-10-06 MED ORDER — SODIUM CHLORIDE 0.9 % IV SOLN
500.0000 mg | Freq: Once | INTRAVENOUS | Status: AC
  Administered 2018-10-06: 500 mg via INTRAVENOUS
  Filled 2018-10-06: qty 500

## 2018-10-06 MED ORDER — OXYCODONE HCL 5 MG PO TABS
15.0000 mg | ORAL_TABLET | ORAL | Status: DC | PRN
  Administered 2018-10-06 – 2018-10-09 (×3): 15 mg via ORAL
  Filled 2018-10-06 (×3): qty 3

## 2018-10-06 MED ORDER — LORATADINE 10 MG PO TABS
10.0000 mg | ORAL_TABLET | Freq: Every day | ORAL | Status: DC
  Administered 2018-10-06 – 2018-10-12 (×7): 10 mg via ORAL
  Filled 2018-10-06 (×7): qty 1

## 2018-10-06 MED ORDER — METOPROLOL TARTRATE 50 MG PO TABS
50.0000 mg | ORAL_TABLET | Freq: Two times a day (BID) | ORAL | Status: DC
  Administered 2018-10-06 – 2018-10-12 (×13): 50 mg via ORAL
  Filled 2018-10-06 (×13): qty 1

## 2018-10-06 MED ORDER — VITAMIN C 500 MG PO TABS
500.0000 mg | ORAL_TABLET | Freq: Two times a day (BID) | ORAL | Status: DC
  Administered 2018-10-06 – 2018-10-12 (×12): 500 mg via ORAL
  Filled 2018-10-06 (×12): qty 1

## 2018-10-06 MED ORDER — AMITRIPTYLINE HCL 25 MG PO TABS
100.0000 mg | ORAL_TABLET | Freq: Every day | ORAL | Status: DC
  Administered 2018-10-06 – 2018-10-11 (×6): 100 mg via ORAL
  Filled 2018-10-06 (×6): qty 4

## 2018-10-06 MED ORDER — SODIUM CHLORIDE 0.9 % IV SOLN
2.0000 g | INTRAVENOUS | Status: DC
  Administered 2018-10-07 – 2018-10-10 (×4): 2 g via INTRAVENOUS
  Filled 2018-10-06: qty 20
  Filled 2018-10-06 (×4): qty 2

## 2018-10-06 MED ORDER — POTASSIUM CHLORIDE 10 MEQ/100ML IV SOLN
10.0000 meq | INTRAVENOUS | Status: AC
  Administered 2018-10-06 (×3): 10 meq via INTRAVENOUS
  Filled 2018-10-06 (×3): qty 100

## 2018-10-06 MED ORDER — HYDROXYCHLOROQUINE SULFATE 200 MG PO TABS
400.0000 mg | ORAL_TABLET | Freq: Two times a day (BID) | ORAL | Status: AC
  Administered 2018-10-06 – 2018-10-07 (×2): 400 mg via ORAL
  Filled 2018-10-06 (×3): qty 2

## 2018-10-06 MED ORDER — FUROSEMIDE 10 MG/ML IJ SOLN
40.0000 mg | Freq: Every day | INTRAMUSCULAR | Status: DC
  Administered 2018-10-06 – 2018-10-11 (×6): 40 mg via INTRAVENOUS
  Filled 2018-10-06 (×6): qty 4

## 2018-10-06 NOTE — ED Triage Notes (Addendum)
Pt arriving via [facility] from home with complaint of SOB, fever/chills x1 day. No know exposure to covid, pt reports being at home for the last 10 days. Was seen at [facility] last night for same.

## 2018-10-06 NOTE — ED Notes (Signed)
Date and time results received: 10/06/18 0546   Test: troponin  Critical Value: 0.05  Name of Provider Notified: Sharl Ma, MD

## 2018-10-06 NOTE — ED Notes (Signed)
Bed: IO27 Expected date:  Expected time:  Means of arrival:  Comments: 81F SOB, fever, wants corona test

## 2018-10-06 NOTE — ED Provider Notes (Addendum)
[facility]-EMERGENCY DEPT Provider Note  CSN: [id number] Arrival date & time: 10/06/18 0140  Chief Complaint(s) Shortness of Breath  HPI Melinda [name] is a 68 y.o. female with extensive past medical history listed below including asthma, pulmonary hypertension, diastolic heart failure who presents to the emergency department for worsening of shortness of breath.  Patient was evaluated yesterday in the emergency department for flulike illness and noted to have a new oxygen requirement.  Admission was considered that that time but they were able to obtain home oxygen for the patient.  It looks like she was satting well on 2 L nasal cannula at rest and required 4 L nasal cannula during ambulation.  Patient reports that she woke up in the middle of the night with shortness of breath and noted that her oxygen tank had been depleted.  She states that she kept a tank at 2 L.  Patient was noted to be hypoxic on room air by EMS satting in the 80s.  Placed on nonrebreather.   Patient reports that she continues to have myalgias and malaise with subjective fevers.  She is taking Tylenol and Mucinex as recommended.    HPI  Past Medical History Past Medical History:  Diagnosis Date   Allergy    Anxiety    Arthritis    "99% of my body" (11/11/2016)   Asthma    Bursitis of both hips    Cataract    Cervical scoliosis    Chronic back pain    "all over my back" (11/11/2016)   Depressive disorder, not elsewhere classified    Enthesopathy of hip region    Environmental allergies    "I take Claritin qd; 365 days/year" (11/11/2016)   Essential hypertension, benign    Fibromyalgia    Frequent falls    GERD (gastroesophageal reflux disease)    Headache    "at least 1/wk; may last for 2 days or so" (11/11/2016)   History of blood transfusion 1985   w/hysterectomy   Lumbar stenosis    Migraine    "a few/month" (11/11/2016)   Muscle weakness (generalized)     Myalgia and myositis, unspecified    Osteoporosis    Other abnormal blood chemistry    Other malaise and fatigue    Raynaud's syndrome    Sciatic nerve pain, left    Sciatica    Unspecified vitamin D deficiency    Patient Active Problem List   Diagnosis Date Noted   Polyneuropathy in diseases classified elsewhere (HCC) 10/04/2018   Prediabetes 09/12/2017   High risk medication use 09/12/2017   Frequent falls 09/12/2017   Chronic pain syndrome 08/14/2017   Gastroesophageal reflux disease 08/14/2017   Cough variant asthma 03/14/2017   Pulmonary hypertension (HCC) 03/12/2017   Chest pain 11/11/2016   Asthma 11/11/2016   Atypical chest pain    Costochondritis    Chronic congestive heart failure (HCC)    MDD (major depressive disorder), recurrent episode (HCC) 05/23/2015   Allergic rhinitis 11/03/2014   Paresthesia 09/08/2014   Peripheral neuropathy 07/14/2014   Morbid obesity due to excess calories (HCC) 07/14/2014   Pain in limb 07/13/2014   Abnormality of gait 07/13/2014   Fibromyalgia 06/13/2014   Spinal stenosis 01/27/2013   HTN (hypertension) 11/30/2012   Home Medication(s) Prior to Admission medications   Medication Sig Start Date End Date Taking? Authorizing Provider  acetaminophen (TYLENOL) 500 MG tablet Take 500 mg by mouth every 6 (six) hours as needed for moderate pain or fever.  Yes [provider]  albuterol (VENTOLIN HFA) 108 (90 Base) MCG/ACT inhaler INHALE 2 PUFFS  EVERY 6 (SIX) HOURS AS NEEDED FOR WHEEZING OR SHORTNESS OF BREATH. Patient taking differently: Inhale 2 puffs into the lungs every 6 (six) hours as needed for wheezing.  03/24/18  Yes Kirt Boys, DO  amitriptyline (ELAVIL) 100 MG tablet Take 1 tablet (100 mg total) by mouth at bedtime. For mood 09/03/18  Yes [name], [name] C, NP  antiseptic oral rinse (BIOTENE) LIQD 15 mLs by Mouth Rinse route as needed for dry mouth.   Yes [provider]  aspirin EC  81 MG tablet Take 81 mg by mouth daily.   Yes [provider]  carboxymethylcellulose (REFRESH PLUS) 0.5 % SOLN Place 2 drops into both eyes 2 (two) times daily as needed (dry eyes).    Yes [provider]  Cholecalciferol (VITAMIN D3) 2000 units TABS Take 2,000 Units by mouth daily.    Yes [provider]  citalopram (CELEXA) 20 MG tablet Take 1 tablet (20 mg total) by mouth daily. 09/03/18  Yes [name], [name] C, NP  famotidine (PEPCID) 20 MG tablet Take 1 tablet (20 mg total) by mouth at bedtime. 09/03/18  Yes [name], [name] C, NP  fluticasone (FLONASE) 50 MCG/ACT nasal spray Place 2 sprays into both nostrils daily as needed for allergies or rhinitis. 03/24/18  Yes Montez Morita, Monica, DO  gabapentin (NEURONTIN) 400 MG capsule TAKE 3 CAPSULES TWICE DAILY  AND TAKE 2 CAPSULES AT BEDTIME  FOR  NEUROPATHY Patient taking differently: Take 800-1,200 mg by mouth See admin instructions. TAKE 3 CAPSULES ( ) TWICE DAILY  AND TAKE 2 CAPSULES ( ) AT BEDTIME  FOR  NEUROPATHY 09/03/18  Yes [name], [name] C, NP  loratadine (CLARITIN) 10 MG tablet Take 10 mg by mouth daily.   Yes [provider]  metoprolol tartrate (LOPRESSOR) 50 MG tablet TAKE ONE TABLET TWICE DAILY FOR BLOOD PRESSURE Patient taking differently: Take 50 mg by mouth 2 (two) times daily. TAKE ONE TABLET TWICE DAILY FOR BLOOD PRESSURE 03/24/18  Yes Kirt Boys, DO  oxyCODONE (ROXICODONE) 15 MG immediate release tablet Take 1 tablet (15 mg total) by mouth every 4 (four) hours as needed for pain. 09/17/18  Yes [name], [name] C, NP  methocarbamol (ROBAXIN) 500 MG tablet Take 1 tablet (500 mg total) by mouth 2 (two) times daily. For muscle spasm Patient not taking: Reported on 10/05/2018 03/24/18   Kirt Boys, DO                                                                                                                                    Past Surgical History Past Surgical History:  Procedure Laterality Date    ABDOMINAL HYSTERECTOMY  1985   CESAREAN SECTION  1976; 1979   SHOULDER OPEN ROTATOR CUFF REPAIR Left    Family History Family History  Problem Relation Age of Onset   Dementia Mother  Heart disease Mother    Hypertension Mother    Diabetes Mother    Diabetes Sister    Cancer Sister        lung   Cancer Other        Nepher (Mothers side)   Diabetes Maternal Grandmother    Arthritis Maternal Grandmother    Diabetes Cousin        Mother's side   Arthritis Sister    Arthritis Cousin        Mother's side    Colon cancer Neg Hx    Esophageal cancer Neg Hx    Liver cancer Neg Hx    Pancreatic cancer Neg Hx    Rectal cancer Neg Hx    Stomach cancer Neg Hx     Social History Social History   Tobacco Use   Smoking status: Never Smoker   Smokeless tobacco: Never Used  Substance Use Topics   Alcohol use: No    Alcohol/week: 0.0 standard drinks   Drug use: No   Allergies Patient has no known allergies.  Review of Systems Review of Systems All other systems are reviewed and are negative for acute change except as noted in the HPI  Physical Exam Vital Signs  I have reviewed the triage vital signs BP (!) 143/73    Pulse (!) 102    Temp 98.4 F (36.9 C) (Oral)    Resp (!) 21    SpO2 96%   Physical Exam Vitals signs reviewed.  Constitutional:      General: She is not in acute distress.    Appearance: She is well-developed. She is not diaphoretic.  HENT:     Head: Normocephalic and atraumatic.     Nose: Nose normal.  Eyes:     General: No scleral icterus.       Right eye: No discharge.        Left eye: No discharge.     Conjunctiva/sclera: Conjunctivae normal.     Pupils: Pupils are equal, round, and reactive to light.  Neck:     Musculoskeletal: Normal range of motion and neck supple.  Cardiovascular:     Rate and Rhythm: Normal rate and regular rhythm.     Heart sounds: No murmur. No friction rub. No gallop.   Pulmonary:     Effort:  Pulmonary effort is normal. Tachypnea present.     Breath sounds: No stridor. Wheezing (faint, diffuse exp wheezing) present. No rales.  Abdominal:     General: There is no distension.     Palpations: Abdomen is soft.     Tenderness: There is no abdominal tenderness.  Musculoskeletal:        General: No tenderness.  Skin:    General: Skin is warm and dry.     Findings: No erythema or rash.  Neurological:     Mental Status: She is alert and oriented to person, place, and time.     ED Results and Treatments Labs (all labs ordered are listed, but only abnormal results are displayed) Labs Reviewed  CBC WITH DIFFERENTIAL/PLATELET - Abnormal; Notable for the following components:      Result Value   Monocytes Absolute 1.2 (*)    Abs Immature Granulocytes 0.08 (*)    All other components within normal limits  BASIC METABOLIC PANEL - Abnormal; Notable for the following components:   Potassium 3.2 (*)    Glucose, Bld 130 (*)    Calcium 8.5 (*)    All other components within normal  limits  BLOOD GAS, ARTERIAL - Abnormal; Notable for the following components:   pH, Arterial 7.458 (*)    pCO2 arterial 30.1 (*)    pO2, Arterial 62.2 (*)    All other components within normal limits  BRAIN NATRIURETIC PEPTIDE  D-DIMER, QUANTITATIVE (NOT AT [facility])                                                                                                                         EKG  EKG Interpretation  Date/Time:  Wednesday October 06 2018 04:19:23 EDT Ventricular Rate:  106 PR Interval:    QRS Duration: 93 QT Interval:  323 QTC Calculation: 429 R Axis:   0 Text Interpretation:  Sinus tachycardia Borderline repolarization abnormality No significant change since last tracing Confirmed by Drema Pry [phone]) on 10/06/2018 4:37:24 AM      Radiology Dg Chest Port 1 View  Result Date: 10/06/2018 CLINICAL DATA:  Initial evaluation for acute shortness of breath, fever, chills. EXAM: PORTABLE CHEST 1  VIEW COMPARISON:  Prior radiograph from 10/05/2018. FINDINGS: Mild cardiomegaly, stable. Mediastinal silhouette within normal limits. Lungs hypoinflated with elevation left hemidiaphragm, similar to previous. Diffusely increased pulmonary vascular congestion with interstitial prominence, compatible with pulmonary interstitial edema. Superimposed linear atelectasis and/or scarring within the mid and lower lungs. There is increased confluent opacity at the left lung base, suspicious for infiltrate given history of fever. No pleural effusion. No pneumothorax. No acute osseous finding. IMPRESSION: 1. Cardiomegaly with mild diffuse pulmonary interstitial edema. 2. Superimposed confluent opacity at the left lung base, increased from previous, suspicious for possible infiltrate given provided history of fever. Electronically Signed   By: Rise Mu M.D.   On: 10/06/2018 03:41   Dg Chest Portable 1 View  Result Date: 10/05/2018 CLINICAL DATA:  9-10 days ago had body aches and a "Light fever" with a cough, hx of asthma, called her PCP and they told her she may have covid and to self quarantine, has been doing that for 8 days and has not gotten better, using albuterol inhaler without improvement. EXAM: PORTABLE CHEST 1 VIEW COMPARISON:  03/03/2018 FINDINGS: Cardiac silhouette is normal in size. No mediastinal or hilar masses. There is no evidence of adenopathy. Lungs show prominent bronchovascular markings. There is linear opacity in the mid lungs and lung bases consistent with atelectasis and/or scarring. A component of atelectasis is favored since there has been an increase since the prior radiographs. No lung consolidation. No convincing pleural effusion and no pneumothorax. Skeletal structures are grossly intact. IMPRESSION: 1. No convincing pneumonia. 2. Linear/reticular type opacities in the lung bases and mid lungs consistent with scarring and atelectasis. Chronic prominence of the bronchovascular markings  bilaterally. Electronically Signed   By: Amie Portland M.D.   On: 10/05/2018 12:23   Pertinent labs & imaging results that were available during my care of the patient were reviewed by me and considered in my medical decision making (see chart for details).  Medications Ordered in  ED Medications  cefTRIAXone (ROCEPHIN) 1 g in sodium chloride 0.9 % 100 mL IVPB (1 g Intravenous New Bag/Given 10/06/18 0423)  azithromycin (ZITHROMAX) 500 mg in sodium chloride 0.9 % 250 mL IVPB (500 mg Intravenous New Bag/Given 10/06/18 0430)                                                                                                                                    Procedures .Critical Care Performed by: Nira Conn, MD Authorized by: Nira Conn, MD    CRITICAL CARE Performed by: Amadeo Garnet Lakiesha Ralphs Total critical care time: 45 minutes Critical care time was exclusive of separately billable procedures and treating other patients. Critical care was necessary to treat or prevent imminent or life-threatening deterioration. Critical care was time spent personally by me on the following activities: development of treatment plan with patient and/or surrogate as well as nursing, discussions with consultants, evaluation of patient's response to treatment, examination of patient, obtaining history from patient or surrogate, ordering and performing treatments and interventions, ordering and review of laboratory studies, ordering and review of radiographic studies, pulse oximetry and re-evaluation of patient's condition.    (including critical care time)  Medical Decision Making / ED Course I have reviewed the nursing notes for this encounter and the patient's prior records (if available in EHR or on provided paperwork).    Patient transitioned from nonrebreather to 2 L nasal cannula.  Patient became hypoxic with saturations in the 80s.  Was able to maintain sats at 94% at 6 L nasal  cannula.  Labs unchanged from yesterday.  No leukocytosis or significant anemia.  Mild hypokalemia without other significant electrolyte derangements or renal sufficiency.  BNP and dimer negative.  ABG notable for hypoxia with PO2 of 62%.  Also noted to have mild respiratory alkalosis.  Chest x-ray notable for diffuse opacities with confluent left lung base opacity worsening from yesterday.  Given history of recent viral infection and pain concern for COVID-19, and given the lack of evidence of volume overload on exam and normal BMP, will treat for infectious process.  Also given her increased oxygen requirement at 6 L nasal cannula at rest, I feel the patient requires admission for close monitoring.  Zenith Diss was evaluated in Emergency Department on 10/06/2018 for the symptoms described in the history of present illness. She was evaluated in the context of the global COVID-19 pandemic, which necessitated consideration that the patient might be at risk for infection with the SARS-CoV-2 virus that causes COVID-19. Institutional protocols and algorithms that pertain to the evaluation of patients at risk for COVID-19 are in a state of rapid change based on information released by regulatory bodies including the CDC and federal and state organizations. These policies and algorithms were followed during the patient's care in the ED.    Final Clinical Impression(s) / ED Diagnoses Final diagnoses:  SOB (shortness of breath)  Hypoxia  Respiratory alkalosis      This chart was dictated using voice recognition software.  Despite best efforts to proofread,  errors can occur which can change the documentation meaning.     Nira Conn, MD 10/06/18 [phone]

## 2018-10-06 NOTE — Progress Notes (Signed)
PROGRESS NOTE                                                                                                                                                                                                             Patient Demographics:    Melinda [name], is a 68 y.o. female, DOB - 1951-02-13, WNU:[id number]  Admit date - 10/06/2018   Admitting Physician Meredeth Ide, MD  Outpatient Primary MD for the patient is [name], [name], NP  LOS - 0   Chief Complaint  Patient presents with  . Shortness of Breath       Brief Narrative    This is a no charge note as patient admitted earlier today 68 y.o. female, with history of asthma, pulmonary hypertension, chronic diastolic CHF came to ED with worsening shortness of breath.  Patient was seen in the [facility], ED yesterday for flulike illness and was noted to have new oxygen requirement.  Admission was considered at that time but patient was able to get home oxygen and she was only requiring 2 L of oxygen at rest and 4 L of oxygen during ambulation.  So she was sent home.  Patient says that she woke up in the middle of night with shortness of breath and noted that her oxygen tank had been depleted.  She was noted to be hypoxic on room air by EMS with O2 sats 80%.  In the ED today she is requiring 6 L/min of oxygen.  Patient says that she has symptoms of myalgia with malaise, subjective fever cough with yellow-green phlegm for past 10 days.  It has become worse as per patient.  Chest x-ray shows pulmonary edema/infiltrate She denies chest pain at this time. Denies nausea vomiting or diarrhea. Denies abdominal pain or dysuria. Concern for COVID-19 infection.     Subjective:    Melinda [name] today reports she is feeling better, no fever or chills, no nausea, no diarrhea    Assessment  & Plan :    Active Problems:   CAP (community acquired pneumonia)  Acute hypoxic respiratory failure  -community-acquired pneumonia/cough with ruled out -patient presenting with subjective fever, cough with yellow phlegm, chest x-ray showing infiltrate/pulmonary edema.  Started on ceftriaxone and Zithromax.  Patient has increased oxygen requirement up from 2 L/min yesterday to 6 L/min on admission, she  is high risk for COVID-19, wheezing significant for some volume overload, CRP is elevated, white procalcitonin within normal limit, this is concerning for COVID-19, follow on results, meanwhile I will start on hydroxychloroquine, continue with IV Rocephin and azithromycin,. -We will start on vitamin C and zinc  Acute on chronic diastolic CHF -2D echo 2018 with a preserved EF, grade 2 diastolic CHF, evidence of volume overload, will start on IV Lasix  Hypokalemia -potassium  3.2, repleted, recheck in a.m.   Hypertension-blood pressure stable, continue metoprolol 50 mg twice a day.   Chronic pain syndrome-patient is on oxycodone 15 mg every 4 hours as needed for pain.    Code Status : Full  Family Communication  : None at bedside  Disposition Plan  : home  When stable  Barriers For Discharge : remains on oxygen  Consults  :  None  Procedures  : none  DVT Prophylaxis  :  [place] lovenox  Lab Results  Component Value Date   PLT 233 10/06/2018    Antibiotics  :   Anti-infectives (From admission, onward)   Start     Dose/Rate Route Frequency Ordered Stop   10/07/18 0800  azithromycin (ZITHROMAX) 500 mg in sodium chloride 0.9 % 250 mL IVPB     500 mg 250 mL/hr over 60 Minutes Intravenous Every 24 hours 10/06/18 0515     10/07/18 0800  cefTRIAXone (ROCEPHIN) 2 g in sodium chloride 0.9 % 100 mL IVPB     2 g 200 mL/hr over 30 Minutes Intravenous Every 24 hours 10/06/18 0753     10/07/18 0500  cefTRIAXone (ROCEPHIN) 1 g in sodium chloride 0.9 % 100 mL IVPB  Status:  Discontinued     1 g 200 mL/hr over 30 Minutes Intravenous Every 24 hours 10/06/18 0515 10/06/18 0753   10/06/18 0415   cefTRIAXone (ROCEPHIN) 1 g in sodium chloride 0.9 % 100 mL IVPB     1 g 200 mL/hr over 30 Minutes Intravenous  Once 10/06/18 0412 10/06/18 0500   10/06/18 0415  azithromycin (ZITHROMAX) 500 mg in sodium chloride 0.9 % 250 mL IVPB     500 mg 250 mL/hr over 60 Minutes Intravenous  Once 10/06/18 0412 10/06/18 0625        Objective:   Vitals:   10/06/18 0556 10/06/18 0600 10/06/18 0615 10/06/18 0726  BP: 131/78 133/79 (!) 146/81   Pulse: (!) 102 (!) 102 100   Resp: 20 20 (!) 26   Temp:      TempSrc:      SpO2: 95% 95% 94%   Weight:    101.9 kg  Height:    5\' 4"  (1.626 m)    Wt Readings from Last 3 Encounters:  10/06/18 101.9 kg  09/03/18 109.8 kg  09/03/18 109.8 kg     Intake/Output Summary (Last 24 hours) at 10/06/2018 1420 Last data filed at 10/06/2018 1016 Gross per 24 hour  Intake 120 ml  Output -  Net 120 ml     Physical Exam  Awake Alert, Oriented X 3, No new F.N deficits, Normal affect Symmetrical Chest wall movement, Good air movement bilaterally, CTAB RRR,No Gallops,Rubs or new Murmurs, No Parasternal Heave +ve B.Sounds, Abd Soft, No tenderness, No rebound - guarding or rigidity. No Cyanosis, Clubbing or edema, No new Rash or bruise     Data Review:    CBC Recent Labs  Lab 10/05/18 1201 10/06/18 0318  WBC 6.0 7.4  HGB 13.0 13.3  HCT 42.1 42.0  PLT 203 233  MCV 85.6 86.6  MCH 26.4 27.4  MCHC 30.9 31.7  RDW 12.7 13.1  LYMPHSABS 0.9 0.8  MONOABS 1.1* 1.2*  EOSABS 0.1 0.1  BASOSABS 0.0 0.0    Chemistries  Recent Labs  Lab 10/05/18 1201 10/06/18 0318 10/06/18 0516  NA 136 135  --   K 3.3* 3.2*  --   CL 101 99  --   CO2 21* 24  --   GLUCOSE 93 130*  --   BUN 7* 11  --   CREATININE 0.83 0.88 0.87  CALCIUM 8.7* 8.5*  --   AST 25  --   --   ALT 15  --   --   ALKPHOS 84  --   --   BILITOT 1.0  --   --    ------------------------------------------------------------------------------------------------------------------ No results for  input(s): CHOL, HDL, LDLCALC, TRIG, CHOLHDL, LDLDIRECT in the last 72 hours.  Lab Results  Component Value Date   HGBA1C 5.4 09/03/2018   ------------------------------------------------------------------------------------------------------------------ No results for input(s): TSH, T4TOTAL, T3FREE, THYROIDAB in the last 72 hours.  Invalid input(s): FREET3 ------------------------------------------------------------------------------------------------------------------ Recent Labs    10/06/18 0516  FERRITIN 264    Coagulation profile No results for input(s): INR, PROTIME in the last 168 hours.  Recent Labs    10/06/18 0318  DDIMER 0.41    Cardiac Enzymes Recent Labs  Lab 10/05/18 1201 10/06/18 0516  TROPONINI <0.03 0.05*   ------------------------------------------------------------------------------------------------------------------    Component Value Date/Time   BNP 34.6 10/06/2018 0319    Inpatient Medications  Scheduled Meds: . amitriptyline  100 mg Oral QHS  . aspirin EC  81 mg Oral Daily  . cholecalciferol  2,000 Units Oral Daily  . citalopram  20 mg Oral Daily  . [START ON 10/07/2018] enoxaparin (LOVENOX) injection  50 mg Subcutaneous Q24H  . famotidine  20 mg Oral QHS  . gabapentin  1,200 mg Oral BID  . gabapentin  800 mg Oral QHS  . loratadine  10 mg Oral Daily  . metoprolol tartrate  50 mg Oral BID   Continuous Infusions: . [START ON 10/07/2018] azithromycin    . [START ON 10/07/2018] cefTRIAXone (ROCEPHIN)  IV     PRN Meds:.albuterol, fluticasone, oxyCODONE  Micro Results No results found for this or any previous visit (from the past 240 hour(s)).  Radiology Reports Dg Chest Port 1 View  Result Date: 10/06/2018 CLINICAL DATA:  Initial evaluation for acute shortness of breath, fever, chills. EXAM: PORTABLE CHEST 1 VIEW COMPARISON:  Prior radiograph from 10/05/2018. FINDINGS: Mild cardiomegaly, stable. Mediastinal silhouette within normal limits.  Lungs hypoinflated with elevation left hemidiaphragm, similar to previous. Diffusely increased pulmonary vascular congestion with interstitial prominence, compatible with pulmonary interstitial edema. Superimposed linear atelectasis and/or scarring within the mid and lower lungs. There is increased confluent opacity at the left lung base, suspicious for infiltrate given history of fever. No pleural effusion. No pneumothorax. No acute osseous finding. IMPRESSION: 1. Cardiomegaly with mild diffuse pulmonary interstitial edema. 2. Superimposed confluent opacity at the left lung base, increased from previous, suspicious for possible infiltrate given provided history of fever. Electronically Signed   By: Rise Mu M.D.   On: 10/06/2018 03:41   Dg Chest Portable 1 View  Result Date: 10/05/2018 CLINICAL DATA:  9-10 days ago had body aches and a "Light fever" with a cough, hx of asthma, called her PCP and they told her she may have covid and to self quarantine, has been doing that for 8 days  and has not gotten better, using albuterol inhaler without improvement. EXAM: PORTABLE CHEST 1 VIEW COMPARISON:  03/03/2018 FINDINGS: Cardiac silhouette is normal in size. No mediastinal or hilar masses. There is no evidence of adenopathy. Lungs show prominent bronchovascular markings. There is linear opacity in the mid lungs and lung bases consistent with atelectasis and/or scarring. A component of atelectasis is favored since there has been an increase since the prior radiographs. No lung consolidation. No convincing pleural effusion and no pneumothorax. Skeletal structures are grossly intact. IMPRESSION: 1. No convincing pneumonia. 2. Linear/reticular type opacities in the lung bases and mid lungs consistent with scarring and atelectasis. Chronic prominence of the bronchovascular markings bilaterally. Electronically Signed   By: Amie Portland M.D.   On: 10/05/2018 12:23     Huey Bienenstock M.D on 10/06/2018 at 2:20  PM  Between 7am to 7pm - Pager - [phone]  After 7pm go to [email or web address] - password [email or web address]  [facility] -  Office  [phone]

## 2018-10-06 NOTE — Progress Notes (Signed)
Pt indicated that her son can be contacted with regards to any information about her care and progress. Dr Sharl Ma wanted to know who to contact - her daughter or son. She said her son will do.

## 2018-10-06 NOTE — H&P (Addendum)
TRH H&P    Patient Demographics:    Melinda [name], is a 68 y.o. female  MRN: [id number]  DOB - [birth year]  Admit Date - 10/06/2018  Referring MD/NP/PA: Dr. Eudelia Bunchardama  Outpatient Primary MD for the patient is [name], Donalee Citrininah C, NP  Patient coming from: Home  Chief complaint-shortness of breath   HPI:    Melinda ClinesDebra Cobarrubias  is a 68 y.o. female, with history of asthma, pulmonary hypertension, chronic diastolic CHF came to ED with worsening shortness of breath.  Patient was [facility] in [facility] ED yesterday for flulike illness and was noted to have new oxygen requirement.  Admission was considered at that time but patient was able to get home oxygen and she was only requiring 2 L of oxygen at rest and 4 L of oxygen during ambulation.  So she was sent home.  Patient says that she woke up in the middle of night with shortness of breath and noted that her oxygen tank had been depleted.  She was noted to be hypoxic on room air by EMS with O2 sats 80%.  In the ED today she is requiring 6 L/min of oxygen.  Patient says that she has symptoms of myalgia with malaise, subjective fever cough with yellow-green phlegm for past 10 days.  It has become worse as per patient.  Chest x-ray shows pulmonary edema/infiltrate She denies chest pain at this time. Denies nausea vomiting or diarrhea. Denies abdominal pain or dysuria. Concern for COVID-19 infection.     Review of systems:    In addition to the HPI above,   All other systems reviewed and are negative.    Past History of the following :    Past Medical History:  Diagnosis Date   Allergy    Anxiety    Arthritis    "99% of my body" (11/11/2016)   Asthma    Bursitis of both hips    Cataract    Cervical scoliosis    Chronic back pain    "all over my back" (11/11/2016)   Depressive disorder, not elsewhere classified    Enthesopathy of hip region     Environmental allergies    "I take Claritin qd; 365 days/year" (11/11/2016)   Essential hypertension, benign    Fibromyalgia    Frequent falls    GERD (gastroesophageal reflux disease)    Headache    "at least 1/wk; may last for 2 days or so" (11/11/2016)   History of blood transfusion 1985   w/hysterectomy   Lumbar stenosis    Migraine    "a few/month" (11/11/2016)   Muscle weakness (generalized)    Myalgia and myositis, unspecified    Osteoporosis    Other abnormal blood chemistry    Other malaise and fatigue    Raynaud's syndrome    Sciatic nerve pain, left    Sciatica    Unspecified vitamin D deficiency       Past Surgical History:  Procedure Laterality Date   ABDOMINAL HYSTERECTOMY  1985   CESAREAN SECTION  1976; 1979  SHOULDER OPEN ROTATOR CUFF REPAIR Left       Social History:      Social History   Tobacco Use   Smoking status: Never Smoker   Smokeless tobacco: Never Used  Substance Use Topics   Alcohol use: No    Alcohol/week: 0.0 standard drinks       Family History :     Family History  Problem Relation Age of Onset   Dementia Mother    Heart disease Mother    Hypertension Mother    Diabetes Mother    Diabetes Sister    Cancer Sister        lung   Cancer Other        Nepher (Mothers side)   Diabetes Maternal Grandmother    Arthritis Maternal Grandmother    Diabetes Cousin        Mother's side   Arthritis Sister    Arthritis Cousin        Mother's side    Colon cancer Neg Hx    Esophageal cancer Neg Hx    Liver cancer Neg Hx    Pancreatic cancer Neg Hx    Rectal cancer Neg Hx    Stomach cancer Neg Hx       Home Medications:   Prior to Admission medications   Medication Sig Start Date End Date Taking? Authorizing Provider  acetaminophen (TYLENOL) 500 MG tablet Take 500 mg by mouth every 6 (six) hours as needed for moderate pain or fever.   Yes [provider]  albuterol (VENTOLIN  HFA) 108 (90 Base) MCG/ACT inhaler INHALE 2 PUFFS  EVERY 6 (SIX) HOURS AS NEEDED FOR WHEEZING OR SHORTNESS OF BREATH. Patient taking differently: Inhale 2 puffs into the lungs every 6 (six) hours as needed for wheezing.  03/24/18  Yes Kirt Boys, DO  amitriptyline (ELAVIL) 100 MG tablet Take 1 tablet (100 mg total) by mouth at bedtime. For mood 09/03/18  Yes [name], [name] C, NP  antiseptic oral rinse (BIOTENE) LIQD 15 mLs by Mouth Rinse route as needed for dry mouth.   Yes [provider]  aspirin EC 81 MG tablet Take 81 mg by mouth daily.   Yes [provider]  carboxymethylcellulose (REFRESH PLUS) 0.5 % SOLN Place 2 drops into both eyes 2 (two) times daily as needed (dry eyes).    Yes [provider]  Cholecalciferol (VITAMIN D3) 2000 units TABS Take 2,000 Units by mouth daily.    Yes [provider]  citalopram (CELEXA) 20 MG tablet Take 1 tablet (20 mg total) by mouth daily. 09/03/18  Yes [name], [name] C, NP  famotidine (PEPCID) 20 MG tablet Take 1 tablet (20 mg total) by mouth at bedtime. 09/03/18  Yes [name], [name] C, NP  fluticasone (FLONASE) 50 MCG/ACT nasal spray Place 2 sprays into both nostrils daily as needed for allergies or rhinitis. 03/24/18  Yes Montez Morita, Monica, DO  gabapentin (NEURONTIN) 400 MG capsule TAKE 3 CAPSULES TWICE DAILY  AND TAKE 2 CAPSULES AT BEDTIME  FOR  NEUROPATHY Patient taking differently: Take 800-1,200 mg by mouth See admin instructions. TAKE 3 CAPSULES ( ) TWICE DAILY  AND TAKE 2 CAPSULES ( ) AT BEDTIME  FOR  NEUROPATHY 09/03/18  Yes [name], [name] C, NP  loratadine (CLARITIN) 10 MG tablet Take 10 mg by mouth daily.   Yes [provider]  metoprolol tartrate (LOPRESSOR) 50 MG tablet TAKE ONE TABLET TWICE DAILY FOR BLOOD PRESSURE Patient taking differently: Take 50 mg by mouth 2 (two)  times daily. TAKE ONE TABLET TWICE DAILY FOR BLOOD PRESSURE 03/24/18  Yes Kirt Boysarter, Monica, DO  oxyCODONE (ROXICODONE) 15 MG immediate  release tablet Take 1 tablet (15 mg total) by mouth every 4 (four) hours as needed for pain. 09/17/18  Yes [name], [name] C, NP  methocarbamol (ROBAXIN) 500 MG tablet Take 1 tablet (500 mg total) by mouth 2 (two) times daily. For muscle spasm Patient not taking: Reported on 10/05/2018 03/24/18   Kirt Boysarter, Monica, DO     Allergies:    No Known Allergies   Physical Exam:   Vitals  Blood pressure (!) 143/73, pulse (!) 102, temperature 98.4 F (36.9 C), temperature source Oral, resp. rate (!) 21, SpO2 96 %.  1.  General: Appears in no acute distress  2. Psychiatric: Alert, oriented x3, intact insight and judgment  3. Neurologic: Cranial nerves II 12 grossly intact, moving all extremities.  No focal neurological deficit.  4. HEENMT:  Atraumatic normocephalic, extraocular muscle intact.  Oral mucosa is pink and moist.  5. Respiratory : Decreased breath sounds bilaterally at lung bases.  6. Cardiovascular : S1-S2, regular, no murmur auscultated.  7. Gastrointestinal:  Abdomen is soft, nontender, no organomegaly     Data Review:    CBC Recent Labs  Lab 10/05/18 1201 10/06/18 0318  WBC 6.0 7.4  HGB 13.0 13.3  HCT 42.1 42.0  PLT 203 233  MCV 85.6 86.6  MCH 26.4 27.4  MCHC 30.9 31.7  RDW 12.7 13.1  LYMPHSABS 0.9 0.8  MONOABS 1.1* 1.2*  EOSABS 0.1 0.1  BASOSABS 0.0 0.0   ------------------------------------------------------------------------------------------------------------------  Results for orders placed or performed during the hospital encounter of 10/06/18 (from the past 48 hour(s))  CBC with Differential/Platelet     Status: Abnormal   Collection Time: 10/06/18  3:18 AM  Result Value Ref Range   WBC 7.4 4.0 - 10.5 K/uL   RBC 4.85 3.87 - 5.11 MIL/uL   Hemoglobin 13.3 12.0 - 15.0 g/dL   HCT 40.942.0 81.136.0 - 91.446.0 %   MCV 86.6 80.0 - 100.0 fL   MCH 27.4 26.0 - 34.0 pg   MCHC 31.7 30.0 - 36.0 g/dL   RDW 78.213.1 95.611.5 - 21.315.5 %   Platelets 233 150 - 400 K/uL   nRBC  0.0 0.0 - 0.2 %   Neutrophils Relative % 69 %    Comment: VACUOLATED NEUTROPHILS   Neutro Abs 5.1 1.7 - 7.7 K/uL   Lymphocytes Relative 11 %   Lymphs Abs 0.8 0.7 - 4.0 K/uL   Monocytes Relative 17 %   Monocytes Absolute 1.2 (H) 0.1 - 1.0 K/uL   Eosinophils Relative 1 %   Eosinophils Absolute 0.1 0.0 - 0.5 K/uL   Basophils Relative 1 %   Basophils Absolute 0.0 0.0 - 0.1 K/uL   WBC Morphology Abnormal lymphocytes present    Immature Granulocytes 1 %   Abs Immature Granulocytes 0.08 (H) 0.00 - 0.07 K/uL   Abnormal Lymphocytes Present PRESENT     Comment: Performed at [facility], [address] [place], [place] [id number]  Basic metabolic panel     Status: Abnormal   Collection Time: 10/06/18  3:18 AM  Result Value Ref Range   Sodium 135 135 - 145 mmol/L   Potassium 3.2 (L) 3.5 - 5.1 mmol/L   Chloride 99 98 - 111 mmol/L   CO2 24 22 - 32 mmol/L   Glucose, Bld 130 (H) 70 - 99 mg/dL   BUN 11 8 - 23  mg/dL   Creatinine, Ser 0.16 0.44 - 1.00 mg/dL   Calcium 8.5 (L) 8.9 - 10.3 mg/dL   GFR calc non Af Amer >60 >60 mL/min   GFR calc Af Amer >60 >60 mL/min   Anion gap 12 5 - 15    Comment: Performed at [facility], [address], [place], [place] [address]  D-dimer, quantitative (not at [facility])     Status: None   Collection Time: 10/06/18  3:18 AM  Result Value Ref Range   D-Dimer, Quant 0.41 0.00 - 0.50 ug/mL-FEU    Comment: (NOTE) At the manufacturer cut-off of 0.50 ug/mL FEU, this assay has been documented to exclude PE with a sensitivity and negative predictive value of 97 to 99%.  At this time, this assay has not been approved by the FDA to exclude DVT/VTE. Results should be correlated with clinical presentation. Performed at [facility], [address] [place], [place] [address]   Brain natriuretic peptide     Status: None   Collection Time: 10/06/18  3:19 AM  Result Value Ref Range   B Natriuretic Peptide 34.6 0.0 -  100.0 pg/mL    Comment: Performed at [facility], [address] [place], [place] [address]  Blood gas, arterial     Status: Abnormal   Collection Time: 10/06/18  3:21 AM  Result Value Ref Range   O2 Content 5.0 L/min   Delivery systems NASAL CANNULA    LHR 18 resp/min   pH, Arterial 7.458 (H) 7.350 - 7.450   pCO2 arterial 30.1 (L) 32.0 - 48.0 mmHg   pO2, Arterial 62.2 (L) 83.0 - 108.0 mmHg   Bicarbonate 21.0 20.0 - 28.0 mmol/L   Acid-base deficit 1.5 0.0 - 2.0 mmol/L   O2 Saturation 92.3 %   Patient temperature 98.4    Collection site RIGHT RADIAL    Drawn by [id number]    Sample type ARTERIAL DRAW    Allens test (pass/fail) PASS PASS    Comment: Performed at [facility], [address] [place], [place] [address]    Chemistries  Recent Labs  Lab 10/05/18 1201 10/06/18 0318  NA 136 135  K 3.3* 3.2*  CL 101 99  CO2 21* 24  GLUCOSE 93 130*  BUN 7* 11  CREATININE 0.83 0.88  CALCIUM 8.7* 8.5*  AST 25  --   ALT 15  --   ALKPHOS 84  --   BILITOT 1.0  --    ------------------------------------------------------------------------------------------------------------------  ------------------------------------------------------------------------------------------------------------------ GFR: CrCl cannot be calculated (Unknown ideal weight.). Liver Function Tests: Recent Labs  Lab 10/05/18 1201  AST 25  ALT 15  ALKPHOS 84  BILITOT 1.0  PROT 7.4  ALBUMIN 3.2*   Recent Labs  Lab 10/05/18 1201  LIPASE 17   No results for input(s): AMMONIA in the last 168 hours. Coagulation Profile: No results for input(s): INR, PROTIME in the last 168 hours. Cardiac Enzymes: Recent Labs  Lab 10/05/18 1201  TROPONINI <0.03   BNP (last 3 results) No results for input(s): PROBNP in the last 8760 hours. HbA1C: No results for input(s): HGBA1C in the last 72 hours. CBG: No results for input(s): GLUCAP in the last 168 hours. Lipid  Profile: No results for input(s): CHOL, HDL, LDLCALC, TRIG, CHOLHDL, LDLDIRECT in the last 72 hours. Thyroid Function Tests: No results for input(s): TSH, T4TOTAL, FREET4, T3FREE, THYROIDAB in the last 72 hours. Anemia Panel: No results for input(s): VITAMINB12, FOLATE, FERRITIN, TIBC, IRON,  RETICCTPCT in the last 72 hours.  --------------------------------------------------------------------------------------------------------------- Urine analysis:    Component Value Date/Time   COLORURINE DARK YELLOW 09/11/2017 0938   APPEARANCEUR CLOUDY (A) 09/11/2017 0938   APPEARANCEUR Clear 05/23/2015 1447   LABSPEC 1.027 09/11/2017 0938   PHURINE < OR = 5.0 09/11/2017 0938   GLUCOSEU NEGATIVE 09/11/2017 0938   HGBUR NEGATIVE 09/11/2017 0938   BILIRUBINUR Negative 05/23/2015 1447   KETONESUR NEGATIVE 09/11/2017 0938   PROTEINUR NEGATIVE 09/11/2017 0938   NITRITE NEGATIVE 09/11/2017 0938   LEUKOCYTESUR 1+ (A) 09/11/2017 0938   LEUKOCYTESUR Negative 05/23/2015 1447      Imaging Results:    Dg Chest Port 1 View  Result Date: 10/06/2018 CLINICAL DATA:  Initial evaluation for acute shortness of breath, fever, chills. EXAM: PORTABLE CHEST 1 VIEW COMPARISON:  Prior radiograph from 10/05/2018. FINDINGS: Mild cardiomegaly, stable. Mediastinal silhouette within normal limits. Lungs hypoinflated with elevation left hemidiaphragm, similar to previous. Diffusely increased pulmonary vascular congestion with interstitial prominence, compatible with pulmonary interstitial edema. Superimposed linear atelectasis and/or scarring within the mid and lower lungs. There is increased confluent opacity at the left lung base, suspicious for infiltrate given history of fever. No pleural effusion. No pneumothorax. No acute osseous finding. IMPRESSION: 1. Cardiomegaly with mild diffuse pulmonary interstitial edema. 2. Superimposed confluent opacity at the left lung base, increased from previous, suspicious for possible  infiltrate given provided history of fever. Electronically Signed   By: Rise Mu M.D.   On: 10/06/2018 03:41   Dg Chest Portable 1 View  Result Date: 10/05/2018 CLINICAL DATA:  9-10 days ago had body aches and a "Light fever" with a cough, hx of asthma, called her PCP and they told her she may have covid and to self quarantine, has been doing that for 8 days and has not gotten better, using albuterol inhaler without improvement. EXAM: PORTABLE CHEST 1 VIEW COMPARISON:  03/03/2018 FINDINGS: Cardiac silhouette is normal in size. No mediastinal or hilar masses. There is no evidence of adenopathy. Lungs show prominent bronchovascular markings. There is linear opacity in the mid lungs and lung bases consistent with atelectasis and/or scarring. A component of atelectasis is favored since there has been an increase since the prior radiographs. No lung consolidation. No convincing pleural effusion and no pneumothorax. Skeletal structures are grossly intact. IMPRESSION: 1. No convincing pneumonia. 2. Linear/reticular type opacities in the lung bases and mid lungs consistent with scarring and atelectasis. Chronic prominence of the bronchovascular markings bilaterally. Electronically Signed   By: Amie Portland M.D.   On: 10/05/2018 12:23    My personal review of EKG: Rhythm NSR, nonspecific ST changes.   Assessment & Plan:    Active Problems:   CAP (community acquired pneumonia)   1. Acute hypoxic respiratory failure -community-acquired pneumonia-patient presenting with subjective fever, cough with yellow phlegm, chest x-ray showing infiltrate/pulmonary edema.  Started on ceftriaxone and Zithromax.  Patient has increased oxygen requirement up from 2 L/min yesterday to 6 L/min today.  She is high risk for COVID-19.  Will obtain COVID-19 PCR, LDH, C-reactive protein, procalcitonin.  D-dimer obtained yesterday was within normal limits.  2. Hypokalemia-potassium is 3.2, replace potassium with IV KCl 10  mg x 3.  Check BMP in a.m.   3. Hypertension-blood pressure stable, continue metoprolol 50 mg twice a day.  4. Chronic pain syndrome-patient is on oxycodone 15 mg every 4 hours as needed for pain.   DVT Prophylaxis-   Lovenox   AM Labs Ordered, also please review Full Orders  Family Communication: Admission, patients condition and plan of care including tests being ordered have been discussed with the patient  who indicate understanding and agree with the plan and Code Status.  Code Status: Full code  Admission status: Inpatient: Based on patients clinical presentation and evaluation of above clinical data, I have made determination that patient meets Inpatient criteria at this time.  Time spent in minutes : 60 minutes   Meredeth Ide M.D on 10/06/2018 at 5:02 AM

## 2018-10-06 NOTE — ED Notes (Signed)
ED TO INPATIENT HANDOFF REPORT  ED Nurse Name and Phone #: [phone]  S Name/Age/Gender Melinda [name] 68 y.o. female Room/Bed: WA15/WA15  Code Status   Code Status: Full Code  Home/SNF/Other Home Patient oriented to: self Is this baseline? Yes   Triage Complete: Triage complete  Chief Complaint Shortness of Breath  Triage Note Pt arriving via [facility] from home with complaint of SOB, fever/chills x1 day. No know exposure to covid, pt reports being at home for the last 10 days. Was seen at [facility] last night for same.    Allergies No Known Allergies  Level of Care/Admitting Diagnosis ED Disposition    ED Disposition Condition Comment   Admit  Hospital Area: [facility] [[id number]]  Level of Care: Med-Surg [16]  Diagnosis: CAP (community acquired pneumonia) [[id number]]  Admitting Physician: Meredeth Ide [[id number]]  Attending Physician: Meredeth Ide [[id number]]  Estimated length of stay: 3 - 4 days  Certification:: I certify this patient will need inpatient services for at least 2 midnights  Bed request comments: high risk COVID 19  PT Class (Do Not Modify): Inpatient [101]  PT Acc Code (Do Not Modify): Private [1]       B Medical/Surgery History Past Medical History:  Diagnosis Date  . Allergy   . Anxiety   . Arthritis    "99% of my body" (11/11/2016)  . Asthma   . Bursitis of both hips   . Cataract   . Cervical scoliosis   . Chronic back pain    "all over my back" (11/11/2016)  . Depressive disorder, not elsewhere classified   . Enthesopathy of hip region   . Environmental allergies    "I take Claritin qd; 365 days/year" (11/11/2016)  . Essential hypertension, benign   . Fibromyalgia   . Frequent falls   . GERD (gastroesophageal reflux disease)   . Headache    "at least 1/wk; may last for 2 days or so" (11/11/2016)  . History of blood transfusion 1985   w/hysterectomy  . Lumbar stenosis   . Migraine    "a few/month" (11/11/2016)  . Muscle  weakness (generalized)   . Myalgia and myositis, unspecified   . Osteoporosis   . Other abnormal blood chemistry   . Other malaise and fatigue   . Raynaud's syndrome   . Sciatic nerve pain, left   . Sciatica   . Unspecified vitamin D deficiency    Past Surgical History:  Procedure Laterality Date  . ABDOMINAL HYSTERECTOMY  1985  . CESAREAN SECTION  1976; 1979  . SHOULDER OPEN ROTATOR CUFF REPAIR Left      A IV Location/Drains/Wounds Patient Lines/Drains/Airways Status   Active Line/Drains/Airways    Name:   Placement date:   Placement time:   Site:   Days:   Peripheral IV 10/06/18 Right Antecubital   10/06/18    0323    Antecubital   less than 1   Peripheral IV 10/06/18 Left Antecubital   10/06/18    0545    Antecubital   less than 1          Intake/Output Last 24 hours No intake or output data in the 24 hours ending 10/06/18 1191  Labs/Imaging Results for orders placed or performed during the hospital encounter of 10/06/18 (from the past 48 hour(s))  CBC with Differential/Platelet     Status: Abnormal   Collection Time: 10/06/18  3:18 AM  Result Value Ref Range   WBC 7.4 4.0 - 10.5  K/uL   RBC 4.85 3.87 - 5.11 MIL/uL   Hemoglobin 13.3 12.0 - 15.0 g/dL   HCT 16.1 09.6 - 04.5 %   MCV 86.6 80.0 - 100.0 fL   MCH 27.4 26.0 - 34.0 pg   MCHC 31.7 30.0 - 36.0 g/dL   RDW 40.9 81.1 - 91.4 %   Platelets 233 150 - 400 K/uL   nRBC 0.0 0.0 - 0.2 %   Neutrophils Relative % 69 %    Comment: VACUOLATED NEUTROPHILS   Neutro Abs 5.1 1.7 - 7.7 K/uL   Lymphocytes Relative 11 %   Lymphs Abs 0.8 0.7 - 4.0 K/uL   Monocytes Relative 17 %   Monocytes Absolute 1.2 (H) 0.1 - 1.0 K/uL   Eosinophils Relative 1 %   Eosinophils Absolute 0.1 0.0 - 0.5 K/uL   Basophils Relative 1 %   Basophils Absolute 0.0 0.0 - 0.1 K/uL   WBC Morphology Abnormal lymphocytes present    Immature Granulocytes 1 %   Abs Immature Granulocytes 0.08 (H) 0.00 - 0.07 K/uL   Abnormal Lymphocytes Present PRESENT      Comment: Performed at [facility], [address] [place], [place] [address]  Basic metabolic panel     Status: Abnormal   Collection Time: 10/06/18  3:18 AM  Result Value Ref Range   Sodium 135 135 - 145 mmol/L   Potassium 3.2 (L) 3.5 - 5.1 mmol/L   Chloride 99 98 - 111 mmol/L   CO2 24 22 - 32 mmol/L   Glucose, Bld 130 (H) 70 - 99 mg/dL   BUN 11 8 - 23 mg/dL   Creatinine, Ser 6.21 0.44 - 1.00 mg/dL   Calcium 8.5 (L) 8.9 - 10.3 mg/dL   GFR calc non Af Amer >60 >60 mL/min   GFR calc Af Amer >60 >60 mL/min   Anion gap 12 5 - 15    Comment: Performed at [facility], [address] [place], [place] [address]  D-dimer, quantitative (not at [facility])     Status: None   Collection Time: 10/06/18  3:18 AM  Result Value Ref Range   D-Dimer, Quant 0.41 0.00 - 0.50 ug/mL-FEU    Comment: (NOTE) At the manufacturer cut-off of 0.50 ug/mL FEU, this assay has been documented to exclude PE with a sensitivity and negative predictive value of 97 to 99%.  At this time, this assay has not been approved by the FDA to exclude DVT/VTE. Results should be correlated with clinical presentation. Performed at [facility], [address], [place], [place] [address]   Brain natriuretic peptide     Status: None   Collection Time: 10/06/18  3:19 AM  Result Value Ref Range   B Natriuretic Peptide 34.6 0.0 - 100.0 pg/mL    Comment: Performed at [facility], [address], [place], [place] [address]  Blood gas, arterial     Status: Abnormal   Collection Time: 10/06/18  3:21 AM  Result Value Ref Range   O2 Content 5.0 L/min   Delivery systems NASAL CANNULA    LHR 18 resp/min   pH, Arterial 7.458 (H) 7.350 - 7.450   pCO2 arterial 30.1 (L) 32.0 - 48.0 mmHg   pO2, Arterial 62.2 (L) 83.0 - 108.0 mmHg   Bicarbonate 21.0 20.0 - 28.0 mmol/L   Acid-base deficit 1.5 0.0 - 2.0 mmol/L   O2 Saturation 92.3 %   Patient temperature 98.4     Collection site RIGHT  RADIAL    Drawn by [id number]    Sample type ARTERIAL DRAW    Allens test (pass/fail) PASS PASS    Comment: Performed at [facility], [address] [place], [place] [address]  Creatinine, serum     Status: None   Collection Time: 10/06/18  5:16 AM  Result Value Ref Range   Creatinine, Ser 0.87 0.44 - 1.00 mg/dL   GFR calc non Af Amer >60 >60 mL/min   GFR calc Af Amer >60 >60 mL/min    Comment: Performed at [facility], [address] [place], [place] [address]  Lactate dehydrogenase     Status: Abnormal   Collection Time: 10/06/18  5:16 AM  Result Value Ref Range   LDH 283 (H) 98 - 192 U/L    Comment: Performed at [facility], [address], [place], [place] [address]  C-reactive protein     Status: Abnormal   Collection Time: 10/06/18  5:16 AM  Result Value Ref Range   CRP 18.2 (H) <1.0 mg/dL    Comment: Performed at [facility], [address], [place], [place] [address]  Troponin I - Once     Status: Abnormal   Collection Time: 10/06/18  5:16 AM  Result Value Ref Range   Troponin I 0.05 (HH) <0.03 ng/mL    Comment: CRITICAL RESULT CALLED TO, READ BACK BY AND VERIFIED WITHRonnell Guadalajara RN AT [phone] 10/06/18 MULLINS,T Performed at [facility], [address] [place], [place] [address]   Ferritin     Status: None   Collection Time: 10/06/18  5:16 AM  Result Value Ref Range   Ferritin 264 11 - 307 ng/mL    Comment: Performed at [facility], [address] [place], [place] [address]  Procalcitonin     Status: None   Collection Time: 10/06/18  5:16 AM  Result Value Ref Range   Procalcitonin <0.10 ng/mL    Comment:        Interpretation: PCT (Procalcitonin) <= 0.5 ng/mL: Systemic infection (sepsis) is not likely. Local bacterial infection is possible. (NOTE)       Sepsis PCT Algorithm           Lower Respiratory Tract                                       Infection PCT Algorithm    ----------------------------     ----------------------------         PCT < 0.25 ng/mL                PCT < 0.10 ng/mL         Strongly encourage             Strongly discourage   discontinuation of antibiotics    initiation of antibiotics    ----------------------------     -----------------------------       PCT 0.25 - 0.50 ng/mL            PCT 0.10 - 0.25 ng/mL               OR       >80% decrease in PCT            Discourage initiation of  antibiotics      Encourage discontinuation           of antibiotics    ----------------------------     -----------------------------         PCT >= 0.50 ng/mL              PCT 0.26 - 0.50 ng/mL               AND        <80% decrease in PCT             Encourage initiation of                                             antibiotics       Encourage continuation           of antibiotics    ----------------------------     -----------------------------        PCT >= 0.50 ng/mL                  PCT > 0.50 ng/mL               AND         increase in PCT                  Strongly encourage                                      initiation of antibiotics    Strongly encourage escalation           of antibiotics                                     -----------------------------                                           PCT <= 0.25 ng/mL                                                 OR                                        > 80% decrease in PCT                                     Discontinue / Do not initiate                                             antibiotics Performed at [facility], [address] [place], [place] [id number]    Dg Chest Port 1 View  Result Date: 10/06/2018 CLINICAL DATA:  Initial  evaluation for acute shortness of breath, fever, chills. EXAM: PORTABLE CHEST 1 VIEW COMPARISON:  Prior radiograph from 10/05/2018. FINDINGS:  Mild cardiomegaly, stable. Mediastinal silhouette within normal limits. Lungs hypoinflated with elevation left hemidiaphragm, similar to previous. Diffusely increased pulmonary vascular congestion with interstitial prominence, compatible with pulmonary interstitial edema. Superimposed linear atelectasis and/or scarring within the mid and lower lungs. There is increased confluent opacity at the left lung base, suspicious for infiltrate given history of fever. No pleural effusion. No pneumothorax. No acute osseous finding. IMPRESSION: 1. Cardiomegaly with mild diffuse pulmonary interstitial edema. 2. Superimposed confluent opacity at the left lung base, increased from previous, suspicious for possible infiltrate given provided history of fever. Electronically Signed   By: Rise Mu M.D.   On: 10/06/2018 03:41   Dg Chest Portable 1 View  Result Date: 10/05/2018 CLINICAL DATA:  9-10 days ago had body aches and a "Light fever" with a cough, hx of asthma, called her PCP and they told her she may have covid and to self quarantine, has been doing that for 8 days and has not gotten better, using albuterol inhaler without improvement. EXAM: PORTABLE CHEST 1 VIEW COMPARISON:  03/03/2018 FINDINGS: Cardiac silhouette is normal in size. No mediastinal or hilar masses. There is no evidence of adenopathy. Lungs show prominent bronchovascular markings. There is linear opacity in the mid lungs and lung bases consistent with atelectasis and/or scarring. A component of atelectasis is favored since there has been an increase since the prior radiographs. No lung consolidation. No convincing pleural effusion and no pneumothorax. Skeletal structures are grossly intact. IMPRESSION: 1. No convincing pneumonia. 2. Linear/reticular type opacities in the lung bases and mid lungs consistent with scarring and atelectasis. Chronic prominence of the bronchovascular markings bilaterally. Electronically Signed   By: Amie Portland M.D.    On: 10/05/2018 12:23    Pending Labs Unresulted Labs (From admission, onward)    Start     Ordered   10/13/18 0500  Creatinine, serum  (enoxaparin (LOVENOX)    CrCl >/= 30 ml/min)  Weekly,   R    Comments:  while on enoxaparin therapy    10/06/18 0515   10/07/18 0500  C-reactive protein  Daily,   R     10/06/18 0515   10/07/18 0500  CBC with Differential/Platelet  Daily,   R     10/06/18 0515   10/07/18 0500  Comprehensive metabolic panel  Daily,   R     10/06/18 0515   10/06/18 0516  Novel Coronavirus, NAA (hospital order; send-out to ref lab)  ([facility] IP COVID ADMIT NOVEL CORONAVIRUS, NAA (HOSPITAL ORDER; SEND-OUT TO REF LAB))  Once,   R    Question Answer Comment  Current symptoms Fever and Shortness of breath   Excluded other viral illnesses No (testing not indicated)   Patient immune status Normal      10/06/18 0515   10/06/18 0516  Respiratory Panel by PCR  Add-on,   R    Question:  Patient immune status  Answer:  Normal   10/06/18 0515   10/06/18 0516  Influenza panel by PCR (type A & B)  Add-on,   R     10/06/18 0515   10/06/18 0516  Culture, blood (Routine X 2) w Reflex to ID Panel  BLOOD CULTURE X 2,   R    Question:  Patient immune status  Answer:  Normal   10/06/18 0515   10/06/18 0516  Strep pneumoniae urinary antigen  Once,  R     10/06/18 0515          Vitals/Pain Today's Vitals   10/06/18 0434 10/06/18 0445 10/06/18 0556 10/06/18 0600  BP: (!) 143/73 (!) 151/100 131/78 133/79  Pulse: (!) 102 (!) 104 (!) 102 (!) 102  Resp: (!) 21 (!) 23 20 20   Temp:      TempSrc:      SpO2: 96% 93% 95% 95%    Isolation Precautions Airborne and Contact precautions  Medications Medications  azithromycin (ZITHROMAX) 500 mg in sodium chloride 0.9 % 250 mL IVPB (500 mg Intravenous New Bag/Given 10/06/18 0525)  aspirin EC tablet 81 mg (has no administration in time range)  oxyCODONE (Oxy IR/ROXICODONE) immediate release tablet 15 mg (has no administration in time range)   metoprolol tartrate (LOPRESSOR) tablet 50 mg (has no administration in time range)  amitriptyline (ELAVIL) tablet 100 mg (has no administration in time range)  citalopram (CELEXA) tablet 20 mg (has no administration in time range)  famotidine (PEPCID) tablet 20 mg (has no administration in time range)  gabapentin (NEURONTIN) capsule 800-1,200 mg (has no administration in time range)  Vitamin D3 TABS 2,000 Units (has no administration in time range)  albuterol (PROVENTIL HFA;VENTOLIN HFA) 108 (90 Base) MCG/ACT inhaler 2 puff (has no administration in time range)  fluticasone (FLONASE) 50 MCG/ACT nasal spray 2 spray (has no administration in time range)  loratadine (CLARITIN) tablet 10 mg (has no administration in time range)  enoxaparin (LOVENOX) injection 40 mg (has no administration in time range)  cefTRIAXone (ROCEPHIN) 1 g in sodium chloride 0.9 % 100 mL IVPB (has no administration in time range)  azithromycin (ZITHROMAX) 500 mg in sodium chloride 0.9 % 250 mL IVPB (has no administration in time range)  0.9 %  sodium chloride infusion ( Intravenous New Bag/Given 10/06/18 0555)  potassium chloride 10 mEq in 100 mL IVPB (10 mEq Intravenous New Bag/Given 10/06/18 0600)  cefTRIAXone (ROCEPHIN) 1 g in sodium chloride 0.9 % 100 mL IVPB (0 g Intravenous Stopped 10/06/18 0500)    Mobility walks Low fall risk   Focused Assessments Pulmonary Assessment Handoff:  Lung sounds:   O2 Device: Nasal Cannula O2 Flow Rate (L/min): 2 L/min      R Recommendations: See Admitting Provider Note  Report given to:   Additional Notes: diminished

## 2018-10-07 LAB — COMPREHENSIVE METABOLIC PANEL
ALT: 11 U/L (ref 0–44)
AST: 22 U/L (ref 15–41)
Albumin: 2.8 g/dL — ABNORMAL LOW (ref 3.5–5.0)
Alkaline Phosphatase: 72 U/L (ref 38–126)
Anion gap: 10 (ref 5–15)
BUN: 11 mg/dL (ref 8–23)
CO2: 23 mmol/L (ref 22–32)
Calcium: 7.8 mg/dL — ABNORMAL LOW (ref 8.9–10.3)
Chloride: 97 mmol/L — ABNORMAL LOW (ref 98–111)
Creatinine, Ser: 0.81 mg/dL (ref 0.44–1.00)
GFR calc Af Amer: >60 mL/min (ref >60)
GFR calc non Af Amer: >60 mL/min (ref >60)
Glucose, Bld: 101 mg/dL — ABNORMAL HIGH (ref 70–99)
Potassium: 3.8 mmol/L (ref 3.5–5.1)
Sodium: 130 mmol/L — ABNORMAL LOW (ref 135–145)
Total Bilirubin: 0.8 mg/dL (ref 0.3–1.2)
Total Protein: 6.7 g/dL (ref 6.5–8.1)

## 2018-10-07 LAB — CBC WITH DIFFERENTIAL/PLATELET
Abs Immature Granulocytes: 0.18 10*3/uL — ABNORMAL HIGH (ref 0.00–0.07)
Basophils Absolute: 0 10*3/uL (ref 0.0–0.1)
Basophils Relative: 0 %
Eosinophils Absolute: 0.1 10*3/uL (ref 0.0–0.5)
Eosinophils Relative: 2 %
HCT: 36.6 % (ref 36.0–46.0)
Hemoglobin: 11.2 g/dL — ABNORMAL LOW (ref 12.0–15.0)
Immature Granulocytes: 2 %
Lymphocytes Relative: 15 %
Lymphs Abs: 1.4 10*3/uL (ref 0.7–4.0)
MCH: 26.9 pg (ref 26.0–34.0)
MCHC: 30.6 g/dL (ref 30.0–36.0)
MCV: 88 fL (ref 80.0–100.0)
Monocytes Absolute: 2.3 10*3/uL — ABNORMAL HIGH (ref 0.1–1.0)
Monocytes Relative: 26 %
Neutro Abs: 4.9 10*3/uL (ref 1.7–7.7)
Neutrophils Relative %: 55 %
Platelets: 241 10*3/uL (ref 150–400)
RBC: 4.16 MIL/uL (ref 3.87–5.11)
RDW: 13.2 % (ref 11.5–15.5)
WBC: 9 10*3/uL (ref 4.0–10.5)
nRBC: 0 % (ref 0.0–0.2)

## 2018-10-07 LAB — C-REACTIVE PROTEIN: CRP: 16.1 mg/dL — ABNORMAL HIGH (ref <1.0)

## 2018-10-07 MED ORDER — POTASSIUM CHLORIDE CRYS ER 20 MEQ PO TBCR
40.0000 meq | EXTENDED_RELEASE_TABLET | Freq: Once | ORAL | Status: AC
  Administered 2018-10-07: 40 meq via ORAL
  Filled 2018-10-07: qty 2

## 2018-10-07 NOTE — Progress Notes (Signed)
PROGRESS NOTE                                                                                                                                                                                                             Patient Demographics:    Melinda [name], is a 68 y.o. female, DOB - [birth year], ZOX:[id number]  Admit date - 10/06/2018   Admitting Physician Meredeth Ide, MD  Outpatient Primary MD for the patient is [name], [name], NP  LOS - 1   Chief Complaint  Patient presents with  . Shortness of Breath       Brief Narrative    68 y.o. female, with history of asthma, pulmonary hypertension, chronic diastolic CHF came to ED with worsening shortness of breath.    Had an ED visit before 3 days, where she was hypoxic where she was discharged on home oxygen 2 L, she was back on 6 L upon this admission, work-up significant for volume overload, and pneumonia, admitted for further work-up.   -O2 sats 80%.  In the ED  she is requiring 6 L/min of oxygen.    Subjective:    Melinda [name] today reports some shortness of breath, cough, phlegm, warts she feels more dyspneic today .   Assessment  & Plan :    Active Problems:   CAP (community acquired pneumonia)  Acute hypoxic respiratory failure -Factorial, secondary to volume overload and infectious process, she was 6 L on presentation, improved, currently on 4 L nasal cannula, encouraged to use incentive spirometry.  Community-acquired pneumonia/Rule out COVID 19 -patient presenting with subjective fever, cough with yellow phlegm, chest x-ray showing infiltrate/pulmonary edema.  -Continue with IV Rocephin and azithromycin, continue with the Mucinex -COVID-19 test is pending, continue with hydroxychloroquine, continue with IV azithromycin, and daily EKG to monitor QTC, monitor potassium and magnesium closely, will replete potassium today, CRP is elevated, procalcitonin within normal limit. -Continue  with vitamin C and zinc.  Acute on chronic diastolic CHF -2D echo 2018 with a preserved EF, grade 2 diastolic CHF, evidence of volume overload, improving with IV Lasix.  Hypokalemia -repleted  Hypertension -blood pressure stable, continue metoprolol 50 mg twice a day.  Chronic pain syndrome -patient is on oxycodone 15 mg every 4 hours as needed for pain.   Code Status : Full  Family Communication  :  None at bedside  Disposition Plan  : home  When stable  Barriers For Discharge : Remains on 3 L nasal cannula this morning, remains on IV diuresis, and still ruling out COVID-19  Consults  :  None  Procedures  : none  DVT Prophylaxis  :  [place] lovenox  Lab Results  Component Value Date   PLT 241 10/07/2018    Antibiotics  :   Anti-infectives (From admission, onward)   Start     Dose/Rate Route Frequency Ordered Stop   10/07/18 2000  hydroxychloroquine (PLAQUENIL) tablet 200 mg     200 mg Oral 2 times daily 10/06/18 1424 10/11/18 1959   10/07/18 0800  azithromycin (ZITHROMAX) 500 mg in sodium chloride 0.9 % 250 mL IVPB     500 mg 250 mL/hr over 60 Minutes Intravenous Every 24 hours 10/06/18 0515     10/07/18 0800  cefTRIAXone (ROCEPHIN) 2 g in sodium chloride 0.9 % 100 mL IVPB     2 g 200 mL/hr over 30 Minutes Intravenous Every 24 hours 10/06/18 0753     10/07/18 0500  cefTRIAXone (ROCEPHIN) 1 g in sodium chloride 0.9 % 100 mL IVPB  Status:  Discontinued     1 g 200 mL/hr over 30 Minutes Intravenous Every 24 hours 10/06/18 0515 10/06/18 0753   10/06/18 1600  hydroxychloroquine (PLAQUENIL) tablet 400 mg     400 mg Oral 2 times daily 10/06/18 1424 10/07/18 0845   10/06/18 0415  cefTRIAXone (ROCEPHIN) 1 g in sodium chloride 0.9 % 100 mL IVPB     1 g 200 mL/hr over 30 Minutes Intravenous  Once 10/06/18 0412 10/06/18 0500   10/06/18 0415  azithromycin (ZITHROMAX) 500 mg in sodium chloride 0.9 % 250 mL IVPB     500 mg 250 mL/hr over 60 Minutes Intravenous  Once 10/06/18 0412  10/06/18 0625        Objective:   Vitals:   10/06/18 2108 10/07/18 0601 10/07/18 0845 10/07/18 1502  BP: 116/76 112/67 111/66 124/79  Pulse: 96 84 93 79  Resp: (!) 22 18  18   Temp: 98.5 F (36.9 C) 98.6 F (37 C)  98.4 F (36.9 C)  TempSrc: Oral Oral  Oral  SpO2: 95% 94% 93% 90%  Weight:      Height:        Wt Readings from Last 3 Encounters:  10/06/18 101.9 kg  09/03/18 109.8 kg  09/03/18 109.8 kg     Intake/Output Summary (Last 24 hours) at 10/07/2018 1517 Last data filed at 10/07/2018 1044 Gross per 24 hour  Intake 220 ml  Output 1100 ml  Net -880 ml     Physical Exam  Awake Alert, Oriented X 3, No new F.N deficits, Normal affect Symmetrical Chest wall movement, Good air movement bilaterally, bibasilar Rales RRR,No Gallops,Rubs or new Murmurs, No Parasternal Heave +ve B.Sounds, Abd Soft, No tenderness, No rebound - guarding or rigidity. No Cyanosis, Clubbing , trace pedal edema, No new Rash or bruise       Data Review:    CBC Recent Labs  Lab 10/05/18 1201 10/06/18 0318 10/07/18 0432  WBC 6.0 7.4 9.0  HGB 13.0 13.3 11.2*  HCT 42.1 42.0 36.6  PLT 203 233 241  MCV 85.6 86.6 88.0  MCH 26.4 27.4 26.9  MCHC 30.9 31.7 30.6  RDW 12.7 13.1 13.2  LYMPHSABS 0.9 0.8 1.4  MONOABS 1.1* 1.2* 2.3*  EOSABS 0.1 0.1 0.1  BASOSABS 0.0 0.0 0.0    Chemistries  Recent Labs  Lab 10/05/18 1201 10/06/18 0318 10/06/18 0516 10/07/18 0432  NA 136 135  --  130*  K 3.3* 3.2*  --  3.8  CL 101 99  --  97*  CO2 21* 24  --  23  GLUCOSE 93 130*  --  101*  BUN 7* 11  --  11  CREATININE 0.83 0.88 0.87 0.81  CALCIUM 8.7* 8.5*  --  7.8*  AST 25  --   --  22  ALT 15  --   --  11  ALKPHOS 84  --   --  72  BILITOT 1.0  --   --  0.8   ------------------------------------------------------------------------------------------------------------------ No results for input(s): CHOL, HDL, LDLCALC, TRIG, CHOLHDL, LDLDIRECT in the last 72 hours.  Lab Results  Component Value  Date   HGBA1C 5.4 09/03/2018   ------------------------------------------------------------------------------------------------------------------ No results for input(s): TSH, T4TOTAL, T3FREE, THYROIDAB in the last 72 hours.  Invalid input(s): FREET3 ------------------------------------------------------------------------------------------------------------------ Recent Labs    10/06/18 0516  FERRITIN 264    Coagulation profile No results for input(s): INR, PROTIME in the last 168 hours.  Recent Labs    10/06/18 0318  DDIMER 0.41    Cardiac Enzymes Recent Labs  Lab 10/05/18 1201 10/06/18 0516  TROPONINI <0.03 0.05*   ------------------------------------------------------------------------------------------------------------------    Component Value Date/Time   BNP 34.6 10/06/2018 0319    Inpatient Medications  Scheduled Meds: . amitriptyline  100 mg Oral QHS  . aspirin EC  81 mg Oral Daily  . cholecalciferol  2,000 Units Oral Daily  . citalopram  20 mg Oral Daily  . enoxaparin (LOVENOX) injection  50 mg Subcutaneous Q24H  . famotidine  20 mg Oral QHS  . furosemide  40 mg Intravenous Daily  . gabapentin  1,200 mg Oral BID  . gabapentin  800 mg Oral QHS  . hydroxychloroquine  200 mg Oral BID  . loratadine  10 mg Oral Daily  . metoprolol tartrate  50 mg Oral BID  . vitamin C  500 mg Oral BID  . zinc sulfate  220 mg Oral Daily   Continuous Infusions: . azithromycin 500 mg (10/07/18 1022)  . cefTRIAXone (ROCEPHIN)  IV 2 g (10/07/18 0859)   PRN Meds:.albuterol, fluticasone, oxyCODONE  Micro Results Recent Results (from the past 240 hour(s))  Respiratory Panel by PCR     Status: None   Collection Time: 10/06/18  5:42 AM  Result Value Ref Range Status   Adenovirus NOT DETECTED NOT DETECTED Final   Coronavirus 229E NOT DETECTED NOT DETECTED Final    Comment: (NOTE) The Coronavirus on the Respiratory Panel, DOES NOT test for the novel  Coronavirus (2019  nCoV)    Coronavirus HKU1 NOT DETECTED NOT DETECTED Final   Coronavirus NL63 NOT DETECTED NOT DETECTED Final   Coronavirus OC43 NOT DETECTED NOT DETECTED Final   Metapneumovirus NOT DETECTED NOT DETECTED Final   Rhinovirus / Enterovirus NOT DETECTED NOT DETECTED Final   Influenza A NOT DETECTED NOT DETECTED Final   Influenza B NOT DETECTED NOT DETECTED Final   Parainfluenza Virus 1 NOT DETECTED NOT DETECTED Final   Parainfluenza Virus 2 NOT DETECTED NOT DETECTED Final   Parainfluenza Virus 3 NOT DETECTED NOT DETECTED Final   Parainfluenza Virus 4 NOT DETECTED NOT DETECTED Final   Respiratory Syncytial Virus NOT DETECTED NOT DETECTED Final   Bordetella pertussis NOT DETECTED NOT DETECTED Final   Chlamydophila pneumoniae NOT DETECTED NOT DETECTED Final   Mycoplasma pneumoniae NOT DETECTED NOT DETECTED  Final    Comment: Performed at [facility], [address] [place], [place] [address]  Culture, blood (Routine X 2) w Reflex to ID Panel     Status: None (Preliminary result)   Collection Time: 10/06/18  5:43 AM  Result Value Ref Range Status   Specimen Description   Final    BLOOD LEFT ANTECUBITAL Performed at [facility], [address] [place], [place] [address]    Special Requests   Final    BOTTLES DRAWN AEROBIC AND ANAEROBIC Blood Culture results may not be optimal due to an excessive volume of blood received in culture bottles Performed at [facility], [address] [place], [place] [address]    Culture   Final    NO GROWTH 1 DAY Performed at [facility], [address] [place], [place] [address]    Report Status PENDING  Incomplete  Culture, blood (Routine X 2) w Reflex to ID Panel     Status: None (Preliminary result)   Collection Time: 10/06/18  5:44 AM  Result Value Ref Range Status   Specimen Description   Final    BLOOD BLOOD LEFT FOREARM Performed at [facility], [address]  [place], [place] [address]    Special Requests   Final    BOTTLES DRAWN AEROBIC AND ANAEROBIC Blood Culture results may not be optimal due to an inadequate volume of blood received in culture bottles Performed at [facility], [address] [place], [place] [address]    Culture   Final    NO GROWTH 1 DAY Performed at [facility], [address] [place], [place] [address]    Report Status PENDING  Incomplete    Radiology Reports Dg Chest Port 1 View  Result Date: 10/06/2018 CLINICAL DATA:  Initial evaluation for acute shortness of breath, fever, chills. EXAM: PORTABLE CHEST 1 VIEW COMPARISON:  Prior radiograph from 10/05/2018. FINDINGS: Mild cardiomegaly, stable. Mediastinal silhouette within normal limits. Lungs hypoinflated with elevation left hemidiaphragm, similar to previous. Diffusely increased pulmonary vascular congestion with interstitial prominence, compatible with pulmonary interstitial edema. Superimposed linear atelectasis and/or scarring within the mid and lower lungs. There is increased confluent opacity at the left lung base, suspicious for infiltrate given history of fever. No pleural effusion. No pneumothorax. No acute osseous finding. IMPRESSION: 1. Cardiomegaly with mild diffuse pulmonary interstitial edema. 2. Superimposed confluent opacity at the left lung base, increased from previous, suspicious for possible infiltrate given provided history of fever. Electronically Signed   By: Rise MuBenjamin  McClintock M.D.   On: 10/06/2018 03:41   Dg Chest Portable 1 View  Result Date: 10/05/2018 CLINICAL DATA:  9-10 days ago had body aches and a "Light fever" with a cough, hx of asthma, called her PCP and they told her she may have covid and to self quarantine, has been doing that for 8 days and has not gotten better, using albuterol inhaler without improvement. EXAM: PORTABLE CHEST 1 VIEW COMPARISON:  03/03/2018 FINDINGS: Cardiac silhouette is normal in size. No  mediastinal or hilar masses. There is no evidence of adenopathy. Lungs show prominent bronchovascular markings. There is linear opacity in the mid lungs and lung bases consistent with atelectasis and/or scarring. A component of atelectasis is favored since there has been an increase since the prior radiographs. No lung consolidation. No convincing pleural effusion and no pneumothorax. Skeletal structures are grossly intact. IMPRESSION: 1. No convincing pneumonia. 2. Linear/reticular type  opacities in the lung bases and mid lungs consistent with scarring and atelectasis. Chronic prominence of the bronchovascular markings bilaterally. Electronically Signed   By: Amie Portland M.D.   On: 10/05/2018 12:23     Huey Bienenstock M.D on 10/07/2018 at 3:17 PM  Between 7am to 7pm - Pager - [phone]  After 7pm go to [email or web address] - password [facility]  [facility] -  Office  [phone]

## 2018-10-08 DIAGNOSIS — I5033 Acute on chronic diastolic (congestive) heart failure: Secondary | ICD-10-CM

## 2018-10-08 LAB — C-REACTIVE PROTEIN: CRP: 13.7 mg/dL — ABNORMAL HIGH (ref <1.0)

## 2018-10-08 LAB — CBC WITH DIFFERENTIAL/PLATELET
Abs Immature Granulocytes: 0.28 10*3/uL — ABNORMAL HIGH (ref 0.00–0.07)
Basophils Absolute: 0.1 10*3/uL (ref 0.0–0.1)
Basophils Relative: 1 %
Eosinophils Absolute: 0.2 10*3/uL (ref 0.0–0.5)
Eosinophils Relative: 3 %
HCT: 36.5 % (ref 36.0–46.0)
Hemoglobin: 11.1 g/dL — ABNORMAL LOW (ref 12.0–15.0)
Immature Granulocytes: 4 %
Lymphocytes Relative: 21 %
Lymphs Abs: 1.5 10*3/uL (ref 0.7–4.0)
MCH: 27.1 pg (ref 26.0–34.0)
MCHC: 30.4 g/dL (ref 30.0–36.0)
MCV: 89 fL (ref 80.0–100.0)
Monocytes Absolute: 1.9 10*3/uL — ABNORMAL HIGH (ref 0.1–1.0)
Monocytes Relative: 26 %
Neutro Abs: 3.2 10*3/uL (ref 1.7–7.7)
Neutrophils Relative %: 45 %
Platelets: 282 10*3/uL (ref 150–400)
RBC: 4.1 MIL/uL (ref 3.87–5.11)
RDW: 13.1 % (ref 11.5–15.5)
WBC: 7.2 10*3/uL (ref 4.0–10.5)
nRBC: 0 % (ref 0.0–0.2)

## 2018-10-08 LAB — COMPREHENSIVE METABOLIC PANEL
ALT: 10 U/L (ref 0–44)
AST: 20 U/L (ref 15–41)
Albumin: 2.8 g/dL — ABNORMAL LOW (ref 3.5–5.0)
Alkaline Phosphatase: 70 U/L (ref 38–126)
Anion gap: 12 (ref 5–15)
BUN: 9 mg/dL (ref 8–23)
CO2: 23 mmol/L (ref 22–32)
Calcium: 8.2 mg/dL — ABNORMAL LOW (ref 8.9–10.3)
Chloride: 101 mmol/L (ref 98–111)
Creatinine, Ser: 0.7 mg/dL (ref 0.44–1.00)
GFR calc Af Amer: >60 mL/min (ref >60)
GFR calc non Af Amer: >60 mL/min (ref >60)
Glucose, Bld: 80 mg/dL (ref 70–99)
Potassium: 3.7 mmol/L (ref 3.5–5.1)
Sodium: 136 mmol/L (ref 135–145)
Total Bilirubin: 0.6 mg/dL (ref 0.3–1.2)
Total Protein: 6.9 g/dL (ref 6.5–8.1)

## 2018-10-08 LAB — NOVEL CORONAVIRUS, NAA (HOSP ORDER, SEND-OUT TO REF LAB; TAT 18-24 HRS): SARS-CoV-2, NAA: DETECTED — AB

## 2018-10-08 LAB — MAGNESIUM: Magnesium: 2 mg/dL (ref 1.7–2.4)

## 2018-10-08 MED ORDER — POTASSIUM CHLORIDE CRYS ER 20 MEQ PO TBCR: 40.0000 meq | EXTENDED_RELEASE_TABLET | Freq: Once | ORAL | Status: DC

## 2018-10-08 MED ORDER — FUROSEMIDE 10 MG/ML IJ SOLN
40.0000 mg | Freq: Once | INTRAMUSCULAR | Status: AC
  Administered 2018-10-08: 40 mg via INTRAVENOUS
  Filled 2018-10-08: qty 4

## 2018-10-08 MED ORDER — POTASSIUM CHLORIDE CRYS ER 20 MEQ PO TBCR
40.0000 meq | EXTENDED_RELEASE_TABLET | Freq: Once | ORAL | Status: AC
  Administered 2018-10-08: 40 meq via ORAL
  Filled 2018-10-08: qty 2

## 2018-10-08 MED ORDER — SODIUM CHLORIDE 0.9 % IV SOLN
INTRAVENOUS | Status: DC | PRN
  Administered 2018-10-08 (×2): 250 mL via INTRAVENOUS

## 2018-10-08 MED ORDER — METOCLOPRAMIDE HCL 5 MG/ML IJ SOLN
5.0000 mg | Freq: Once | INTRAMUSCULAR | Status: AC
  Administered 2018-10-08: 5 mg via INTRAVENOUS
  Filled 2018-10-08: qty 2

## 2018-10-08 MED ORDER — GUAIFENESIN ER 600 MG PO TB12
1200.0000 mg | ORAL_TABLET | Freq: Two times a day (BID) | ORAL | Status: DC
  Administered 2018-10-08 – 2018-10-12 (×9): 1200 mg via ORAL
  Filled 2018-10-08 (×9): qty 2

## 2018-10-08 MED ORDER — MAGNESIUM SULFATE IN D5W 1-5 GM/100ML-% IV SOLN
1.0000 g | Freq: Once | INTRAVENOUS | Status: DC
  Filled 2018-10-08: qty 100

## 2018-10-08 MED ORDER — MAGNESIUM SULFATE IN D5W 1-5 GM/100ML-% IV SOLN
1.0000 g | Freq: Once | INTRAVENOUS | Status: AC
  Administered 2018-10-08: 1 g via INTRAVENOUS
  Filled 2018-10-08: qty 100

## 2018-10-08 MED ORDER — SODIUM CHLORIDE 0.9 % IV SOLN: INTRAVENOUS | Status: DC | PRN

## 2018-10-08 NOTE — Progress Notes (Addendum)
At 1149, Elger[place]awy, Mliss Fritz, MD was paged regarding the pt's request for nausea medication.   MD ordered a one time dose of Reglan.

## 2018-10-08 NOTE — Progress Notes (Signed)
At 1653, Huey Bienenstock, MD was notified regarding the positive COVID-19 lab result.

## 2018-10-08 NOTE — Progress Notes (Signed)
PROGRESS NOTE                                                                                                                                                                                                             Patient Demographics:    Melinda [name], is a 68 y.o. female, DOB - [birth year], ION:[id number]  Admit date - 10/06/2018   Admitting Physician Meredeth Ide, MD  Outpatient Primary MD for the patient is [name], Melinda Citrin, NP  LOS - 2   Chief Complaint  Patient presents with   Shortness of Breath       Brief Narrative    68 y.o. female, with history of asthma, pulmonary hypertension, chronic diastolic CHF came to ED with worsening shortness of breath.    Had an ED visit before 3 days, where she was hypoxic where she was discharged on home oxygen 2 L, she was back on 6 L upon this admission, work-up significant for volume overload, and pneumonia, admitted for further work-up.   -O2 sats 80%.  In the ED  she is requiring 6 L/min of oxygen.    Subjective:    [name] today.her dyspnea has improved, still reports cough, with phlegm .   Assessment  & Plan :    Active Problems:   CAP (community acquired pneumonia)  Acute hypoxic respiratory failure -Multifactorial, secondary to volume overload and infectious process, she was 6 L on presentation, needs to improve, she is on 3 L nasal cannula today, she was encouraged again to use incentive spirometry, and to get out of bed to chair.  Community-acquired pneumonia/Rule out COVID 19 -patient presenting with subjective fever, cough with yellow phlegm, chest x-ray showing infiltrate/pulmonary edema.  -Continue with IV Rocephin and azithromycin, continue with the Mucinex -COVID-19 test is pending, continue with hydroxychloroquine, continue with IV azithromycin, QTC is 430 today, replete potassium, monitor magnesium today, RP trending down which is reassuring, procalcitonin within normal  limit. -Continue with vitamin C and zinc.  Acute on chronic diastolic CHF -2D echo 2018 with a preserved EF, grade 2 diastolic CHF, evidence of volume overload on imaging and physical exam, she is improving with IV Lasix, renal function is stable, will give extra 40 mg IV Lasix today.  Hypokalemia -repleted, will try to keep above 4 given he is on Plaquenil and Romycin  Hypertension -blood pressure stable, continue metoprolol 50 mg twice a day.  Chronic pain syndrome -patient is on oxycodone 15 mg every 4 hours as needed for pain.   Code Status : Full  Family Communication  : None at bedside  Disposition Plan  : home  When stable  Barriers For Discharge : Remains on 3 L nasal cannula this morning, remains on IV diuresis, and still ruling out COVID-19  Consults  :  None  Procedures  : none  DVT Prophylaxis  :  [place] lovenox  Lab Results  Component Value Date   PLT 282 10/08/2018    Antibiotics  :   Anti-infectives (From admission, onward)   Start     Dose/Rate Route Frequency Ordered Stop   10/07/18 2000  hydroxychloroquine (PLAQUENIL) tablet 200 mg     200 mg Oral 2 times daily 10/06/18 1424 10/11/18 1959   10/07/18 0800  azithromycin (ZITHROMAX) 500 mg in sodium chloride 0.9 % 250 mL IVPB     500 mg 250 mL/hr over 60 Minutes Intravenous Every 24 hours 10/06/18 0515     10/07/18 0800  cefTRIAXone (ROCEPHIN) 2 g in sodium chloride 0.9 % 100 mL IVPB     2 g 200 mL/hr over 30 Minutes Intravenous Every 24 hours 10/06/18 0753     10/07/18 0500  cefTRIAXone (ROCEPHIN) 1 g in sodium chloride 0.9 % 100 mL IVPB  Status:  Discontinued     1 g 200 mL/hr over 30 Minutes Intravenous Every 24 hours 10/06/18 0515 10/06/18 0753   10/06/18 1600  hydroxychloroquine (PLAQUENIL) tablet 400 mg     400 mg Oral 2 times daily 10/06/18 1424 10/07/18 0845   10/06/18 0415  cefTRIAXone (ROCEPHIN) 1 g in sodium chloride 0.9 % 100 mL IVPB     1 g 200 mL/hr over 30 Minutes Intravenous  Once  10/06/18 0412 10/06/18 0500   10/06/18 0415  azithromycin (ZITHROMAX) 500 mg in sodium chloride 0.9 % 250 mL IVPB     500 mg 250 mL/hr over 60 Minutes Intravenous  Once 10/06/18 0412 10/06/18 0625        Objective:   Vitals:   10/07/18 1502 10/07/18 2108 10/08/18 0359 10/08/18 0818  BP: 124/79 (!) 108/55 107/67 114/62  Pulse: 79 88 78   Resp: 18 18 18    Temp: 98.4 F (36.9 C) 98.8 F (37.1 C) 98.7 F (37.1 C)   TempSrc: Oral Oral Oral   SpO2: 90% 92% 94%   Weight:      Height:        Wt Readings from Last 3 Encounters:  10/06/18 101.9 kg  09/03/18 109.8 kg  09/03/18 109.8 kg     Intake/Output Summary (Last 24 hours) at 10/08/2018 1219 Last data filed at 10/08/2018 1149 Gross per 24 hour  Intake 864.01 ml  Output 1025 ml  Net -160.99 ml     Physical Exam  Awake Alert, Oriented X 3, No new F.N deficits, Normal affect Symmetrical Chest wall movement, Good air movement bilaterally, bibasilar Rales RRR,No Gallops,Rubs or new Murmurs, No Parasternal Heave +ve B.Sounds, Abd Soft, No tenderness, No rebound - guarding or rigidity. No Cyanosis, Clubbing or edema, No new Rash or bruise        Data Review:    CBC Recent Labs  Lab 10/05/18 1201 10/06/18 0318 10/07/18 0432 10/08/18 0335  WBC 6.0 7.4 9.0 7.2  HGB 13.0 13.3 11.2* 11.1*  HCT 42.1 42.0 36.6 36.5  PLT 203 233 241 282  MCV 85.6 86.6 88.0 89.0  MCH 26.4 27.4 26.9 27.1  MCHC 30.9 31.7 30.6 30.4  RDW 12.7 13.1 13.2 13.1  LYMPHSABS 0.9 0.8 1.4 1.5  MONOABS 1.1* 1.2* 2.3* 1.9*  EOSABS 0.1 0.1 0.1 0.2  BASOSABS 0.0 0.0 0.0 0.1    Chemistries  Recent Labs  Lab 10/05/18 1201 10/06/18 0318 10/06/18 0516 10/07/18 0432 10/08/18 0335  NA 136 135  --  130* 136  K 3.3* 3.2*  --  3.8 3.7  CL 101 99  --  97* 101  CO2 21* 24  --  23 23  GLUCOSE 93 130*  --  101* 80  BUN 7* 11  --  11 9  CREATININE 0.83 0.88 0.87 0.81 0.70  CALCIUM 8.7* 8.5*  --  7.8* 8.2*  MG  --   --   --   --  2.0  AST 25  --    --  22 20  ALT 15  --   --  11 10  ALKPHOS 84  --   --  72 70  BILITOT 1.0  --   --  0.8 0.6   ------------------------------------------------------------------------------------------------------------------ No results for input(s): CHOL, HDL, LDLCALC, TRIG, CHOLHDL, LDLDIRECT in the last 72 hours.  Lab Results  Component Value Date   HGBA1C 5.4 09/03/2018   ------------------------------------------------------------------------------------------------------------------ No results for input(s): TSH, T4TOTAL, T3FREE, THYROIDAB in the last 72 hours.  Invalid input(s): FREET3 ------------------------------------------------------------------------------------------------------------------ Recent Labs    10/06/18 0516  FERRITIN 264    Coagulation profile No results for input(s): INR, PROTIME in the last 168 hours.  Recent Labs    10/06/18 0318  DDIMER 0.41    Cardiac Enzymes Recent Labs  Lab 10/05/18 1201 10/06/18 0516  TROPONINI <0.03 0.05*   ------------------------------------------------------------------------------------------------------------------    Component Value Date/Time   BNP 34.6 10/06/2018 0319    Inpatient Medications  Scheduled Meds:  amitriptyline  100 mg Oral QHS   aspirin EC  81 mg Oral Daily   cholecalciferol  2,000 Units Oral Daily   citalopram  20 mg Oral Daily   enoxaparin (LOVENOX) injection  50 mg Subcutaneous Q24H   famotidine  20 mg Oral QHS   furosemide  40 mg Intravenous Daily   gabapentin  1,200 mg Oral BID   gabapentin  800 mg Oral QHS   hydroxychloroquine  200 mg Oral BID   loratadine  10 mg Oral Daily   metoprolol tartrate  50 mg Oral BID   vitamin C  500 mg Oral BID   zinc sulfate  220 mg Oral Daily   Continuous Infusions:  sodium chloride     sodium chloride 250 mL (10/08/18 1007)   azithromycin Stopped (10/08/18 0935)   cefTRIAXone (ROCEPHIN)  IV Stopped (10/08/18 1038)   PRN Meds:.sodium  chloride, sodium chloride, albuterol, fluticasone, oxyCODONE  Micro Results Recent Results (from the past 240 hour(s))  Respiratory Panel by PCR     Status: None   Collection Time: 10/06/18  5:42 AM  Result Value Ref Range Status   Adenovirus NOT DETECTED NOT DETECTED Final   Coronavirus 229E NOT DETECTED NOT DETECTED Final    Comment: (NOTE) The Coronavirus on the Respiratory Panel, DOES NOT test for the novel  Coronavirus (2019 nCoV)    Coronavirus HKU1 NOT DETECTED NOT DETECTED Final   Coronavirus NL63 NOT DETECTED NOT DETECTED Final   Coronavirus OC43 NOT DETECTED NOT DETECTED Final   Metapneumovirus NOT DETECTED NOT DETECTED Final   Rhinovirus / Enterovirus NOT DETECTED  NOT DETECTED Final   Influenza A NOT DETECTED NOT DETECTED Final   Influenza B NOT DETECTED NOT DETECTED Final   Parainfluenza Virus 1 NOT DETECTED NOT DETECTED Final   Parainfluenza Virus 2 NOT DETECTED NOT DETECTED Final   Parainfluenza Virus 3 NOT DETECTED NOT DETECTED Final   Parainfluenza Virus 4 NOT DETECTED NOT DETECTED Final   Respiratory Syncytial Virus NOT DETECTED NOT DETECTED Final   Bordetella pertussis NOT DETECTED NOT DETECTED Final   Chlamydophila pneumoniae NOT DETECTED NOT DETECTED Final   Mycoplasma pneumoniae NOT DETECTED NOT DETECTED Final    Comment: Performed at [facility], [address] [place], [place] [address]  Culture, blood (Routine X 2) w Reflex to ID Panel     Status: None (Preliminary result)   Collection Time: 10/06/18  5:43 AM  Result Value Ref Range Status   Specimen Description   Final    BLOOD LEFT ANTECUBITAL Performed at [facility], [address] [place], [place] [address]    Special Requests   Final    BOTTLES DRAWN AEROBIC AND ANAEROBIC Blood Culture results may not be optimal due to an excessive volume of blood received in culture bottles Performed at [facility], [address] [place], [place] [address]     Culture   Final    NO GROWTH 2 DAYS Performed at [facility], [address] [place], [place] [address]    Report Status PENDING  Incomplete  Culture, blood (Routine X 2) w Reflex to ID Panel     Status: None (Preliminary result)   Collection Time: 10/06/18  5:44 AM  Result Value Ref Range Status   Specimen Description   Final    BLOOD BLOOD LEFT FOREARM Performed at [facility], [address] [place], [place] [address]    Special Requests   Final    BOTTLES DRAWN AEROBIC AND ANAEROBIC Blood Culture results may not be optimal due to an inadequate volume of blood received in culture bottles Performed at [facility], [address] [place], [place] [address]    Culture   Final    NO GROWTH 2 DAYS Performed at [facility], [address] [place], [place] [address]    Report Status PENDING  Incomplete    Radiology Reports Dg Chest Port 1 View  Result Date: 10/06/2018 CLINICAL DATA:  Initial evaluation for acute shortness of breath, fever, chills. EXAM: PORTABLE CHEST 1 VIEW COMPARISON:  Prior radiograph from 10/05/2018. FINDINGS: Mild cardiomegaly, stable. Mediastinal silhouette within normal limits. Lungs hypoinflated with elevation left hemidiaphragm, similar to previous. Diffusely increased pulmonary vascular congestion with interstitial prominence, compatible with pulmonary interstitial edema. Superimposed linear atelectasis and/or scarring within the mid and lower lungs. There is increased confluent opacity at the left lung base, suspicious for infiltrate given history of fever. No pleural effusion. No pneumothorax. No acute osseous finding. IMPRESSION: 1. Cardiomegaly with mild diffuse pulmonary interstitial edema. 2. Superimposed confluent opacity at the left lung base, increased from previous, suspicious for possible infiltrate given provided history of fever. Electronically Signed   By: Rise MuBenjamin  McClintock M.D.   On: 10/06/2018  03:41   Dg Chest Portable 1 View  Result Date: 10/05/2018 CLINICAL DATA:  9-10 days ago had body aches and a "Light fever" with a cough, hx of asthma, called her PCP and they told her she may have covid and to self quarantine, has been doing that for 8  days and has not gotten better, using albuterol inhaler without improvement. EXAM: PORTABLE CHEST 1 VIEW COMPARISON:  03/03/2018 FINDINGS: Cardiac silhouette is normal in size. No mediastinal or hilar masses. There is no evidence of adenopathy. Lungs show prominent bronchovascular markings. There is linear opacity in the mid lungs and lung bases consistent with atelectasis and/or scarring. A component of atelectasis is favored since there has been an increase since the prior radiographs. No lung consolidation. No convincing pleural effusion and no pneumothorax. Skeletal structures are grossly intact. IMPRESSION: 1. No convincing pneumonia. 2. Linear/reticular type opacities in the lung bases and mid lungs consistent with scarring and atelectasis. Chronic prominence of the bronchovascular markings bilaterally. Electronically Signed   By: Amie Portland M.D.   On: 10/05/2018 12:23     Huey Bienenstock M.D on 10/08/2018 at 12:19 PM  Between 7am to 7pm - Pager - [phone]  After 7pm go to [email or web address] - password [facility]  [facility] -  Office  [phone]

## 2018-10-09 DIAGNOSIS — J069 Acute upper respiratory infection, unspecified: Secondary | ICD-10-CM

## 2018-10-09 LAB — COMPREHENSIVE METABOLIC PANEL
ALT: 13 U/L (ref 0–44)
AST: 19 U/L (ref 15–41)
Albumin: 2.9 g/dL — ABNORMAL LOW (ref 3.5–5.0)
Alkaline Phosphatase: 72 U/L (ref 38–126)
Anion gap: 12 (ref 5–15)
BUN: 9 mg/dL (ref 8–23)
CO2: 25 mmol/L (ref 22–32)
Calcium: 8.4 mg/dL — ABNORMAL LOW (ref 8.9–10.3)
Chloride: 101 mmol/L (ref 98–111)
Creatinine, Ser: 0.72 mg/dL (ref 0.44–1.00)
GFR calc Af Amer: >60 mL/min (ref >60)
GFR calc non Af Amer: >60 mL/min (ref >60)
Glucose, Bld: 73 mg/dL (ref 70–99)
Potassium: 3.8 mmol/L (ref 3.5–5.1)
Sodium: 138 mmol/L (ref 135–145)
Total Bilirubin: 0.6 mg/dL (ref 0.3–1.2)
Total Protein: 7 g/dL (ref 6.5–8.1)

## 2018-10-09 LAB — CBC WITH DIFFERENTIAL/PLATELET
Abs Immature Granulocytes: 0.3 10*3/uL — ABNORMAL HIGH (ref 0.00–0.07)
Basophils Absolute: 0.1 10*3/uL (ref 0.0–0.1)
Basophils Relative: 1 %
Eosinophils Absolute: 0.3 10*3/uL (ref 0.0–0.5)
Eosinophils Relative: 3 %
HCT: 36.5 % (ref 36.0–46.0)
Hemoglobin: 11.5 g/dL — ABNORMAL LOW (ref 12.0–15.0)
Immature Granulocytes: 4 %
Lymphocytes Relative: 21 %
Lymphs Abs: 1.6 10*3/uL (ref 0.7–4.0)
MCH: 27.5 pg (ref 26.0–34.0)
MCHC: 31.5 g/dL (ref 30.0–36.0)
MCV: 87.3 fL (ref 80.0–100.0)
Monocytes Absolute: 1.9 10*3/uL — ABNORMAL HIGH (ref 0.1–1.0)
Monocytes Relative: 25 %
Neutro Abs: 3.6 10*3/uL (ref 1.7–7.7)
Neutrophils Relative %: 46 %
Platelets: 311 10*3/uL (ref 150–400)
RBC: 4.18 MIL/uL (ref 3.87–5.11)
RDW: 13 % (ref 11.5–15.5)
WBC: 7.7 10*3/uL (ref 4.0–10.5)
nRBC: 0 % (ref 0.0–0.2)

## 2018-10-09 LAB — C-REACTIVE PROTEIN: CRP: 8.8 mg/dL — ABNORMAL HIGH (ref <1.0)

## 2018-10-09 MED ORDER — POTASSIUM CHLORIDE CRYS ER 20 MEQ PO TBCR
40.0000 meq | EXTENDED_RELEASE_TABLET | Freq: Once | ORAL | Status: AC
  Administered 2018-10-09: 40 meq via ORAL
  Filled 2018-10-09: qty 2

## 2018-10-09 NOTE — Evaluation (Addendum)
Physical Therapy Evaluation Patient Details Name: Melinda [name] MRN: [id number] DOB: Jun 21, 1951 Today's Date: 10/09/2018   History of Present Illness  68 y.o. female, with history of asthma, pulmonary hypertension, chronic diastolic CHF came to ED with worsening shortness of breath  Clinical Impression  Pt admitted with above diagnosis. Pt currently with functional limitations due to the deficits listed below (see PT Problem List). Pt ambulated 40' with RW in her room, SaO2 86% on 1L O2, 88% on 2L O2. Distance limited by fatigue. Instructed pt in independent strengthening exercises for BUE/LEs, encouraged incentive spirometer. Pt puts forth good effort, good progress expected.  Pt will benefit from skilled PT to increase their independence and safety with mobility to allow discharge to the venue listed below.       Follow Up Recommendations Home health PT    Equipment Recommendations  None recommended by PT    Recommendations for Other Services       Precautions / Restrictions Precautions Precautions: Other (comment) Precaution Comments: monitor O2 Restrictions Weight Bearing Restrictions: No      Mobility  Bed Mobility Overal bed mobility: Modified Independent             General bed mobility comments: HOB up  Transfers Overall transfer level: Needs assistance Equipment used: Rolling walker (2 wheeled) Transfers: Sit to/from Stand Sit to Stand: Min assist         General transfer comment: mild LOB posteriorly upon standing requiring min A, VCs hand placement  Ambulation/Gait Ambulation/Gait assistance: Supervision Gait Distance (Feet): 45 Feet Assistive device: Rolling walker (2 wheeled) Gait Pattern/deviations: Step-through pattern Gait velocity: WFL   General Gait Details: steady with RW, no loss of balance, SaO2 86% on 1L O2 walking, 88% on 2L O2 walking  Stairs            Wheelchair Mobility    Modified Rankin (Stroke Patients Only)        Balance Overall balance assessment: Modified Independent                                           Pertinent Vitals/Pain Pain Assessment: 0-10 Pain Score: 8  Pain Location: head ache Pain Descriptors / Indicators: Aching Pain Intervention(s): Monitored during session;Premedicated before session    Home Living Family/patient expects to be discharged to:: Private residence Living Arrangements: Alone Available Help at Discharge: Available PRN/intermittently Type of Home: Apartment Home Access: Elevator     Home Layout: One level Home Equipment: Environmental consultant - 4 wheels      Prior Function Level of Independence: Independent with assistive device(s)               Hand Dominance        Extremity/Trunk Assessment   Upper Extremity Assessment Upper Extremity Assessment: Overall WFL for tasks assessed    Lower Extremity Assessment Lower Extremity Assessment: Overall WFL for tasks assessed(h/o B peripheral neuropathy, sensation intact to light touch B feet)    Cervical / Trunk Assessment Cervical / Trunk Assessment: Normal  Communication   Communication: No difficulties  Cognition Arousal/Alertness: Awake/alert Behavior During Therapy: WFL for tasks assessed/performed Overall Cognitive Status: Within Functional Limits for tasks assessed  General Comments      Exercises General Exercises - Upper Extremity Shoulder Flexion: AROM;5 reps;Seated General Exercises - Lower Extremity Ankle Circles/Pumps: AROM;Both;10 reps;Seated Long Arc Quad: (demonstrated to pt to be done independently) Hip Flexion/Marching: (demonstrated to pt, to be done independently)   Assessment/Plan    PT Assessment Patient needs continued PT services  PT Problem List Decreased activity tolerance;Cardiopulmonary status limiting activity;Decreased balance       PT Treatment Interventions DME instruction;Gait  training;Therapeutic exercise;Patient/family education    PT Goals (Current goals can be found in the Care Plan section)  Acute Rehab PT Goals Patient Stated Goal: to get stronger and go home PT Goal Formulation: With patient Time For Goal Achievement: 10/23/18 Potential to Achieve Goals: Good    Frequency Min 3X/week   Barriers to discharge        Co-evaluation               AM-PAC PT "6 Clicks" Mobility  Outcome Measure Help needed turning from your back to your side while in a flat bed without using bedrails?: None Help needed moving from lying on your back to sitting on the side of a flat bed without using bedrails?: A Little Help needed moving to and from a bed to a chair (including a wheelchair)?: A Little Help needed standing up from a chair using your arms (e.g., wheelchair or bedside chair)?: A Little Help needed to walk in hospital room?: A Little Help needed climbing 3-5 steps with a railing? : A Little 6 Click Score: 19    End of Session Equipment Utilized During Treatment: Gait belt;Oxygen Activity Tolerance: Patient tolerated treatment well Patient left: in chair;with call bell/phone within reach;with chair alarm set Nurse Communication: Mobility status PT Visit Diagnosis: Difficulty in walking, not elsewhere classified (R26.2);Muscle weakness (generalized) (M62.81)    Time: 1324-4010 PT Time Calculation (min) (ACUTE ONLY): 40 min   Charges:   PT Evaluation $PT Eval Moderate Complexity: 1 Mod PT Treatments $Gait Training: 8-22 mins $Therapeutic Exercise: 8-22 mins        Ralene Bathe Kistler PT 10/09/2018  Acute Rehabilitation Services Pager [phone] Office [phone]

## 2018-10-09 NOTE — Progress Notes (Signed)
Care of pt assumed at 1500. I agree with the previous nurse's assessment. Pt is sitting up in the chair, alert without any current distress or pain. O2 sats stable on 2L nasal canula. Pt continues to be on low-risk contact/droplet precautions. Will continue to monitor.

## 2018-10-09 NOTE — Progress Notes (Signed)
PROGRESS NOTE                                                                                                                                                                                                             Patient Demographics:    Melinda [name], is a 68 y.o. female, DOB - August 20, 1950, [id number]  Admit date - 10/06/2018   Admitting Physician Meredeth Ide, MD  Outpatient Primary MD for the patient is [name], [name], NP  LOS - 3   Chief Complaint  Patient presents with  . Shortness of Breath       Brief Narrative    68 y.o. female, with history of asthma, pulmonary hypertension, chronic diastolic CHF came to ED with worsening shortness of breath.    Had an ED visit before 3 days, where she was hypoxic where she was discharged on home oxygen 2 L, she was back on 6 L upon this admission, work-up significant for volume overload, and pneumonia, admitted for further work-up.  She was COVID-19 positive   -O2 sats 80%.  In the ED  she is requiring 6 L/min of oxygen.    Subjective:    Melinda [name] today ports cough, more productive today, still reports dyspnea    Assessment  & Plan :    Active Problems:   CAP (community acquired pneumonia)  Acute hypoxic respiratory failure due to COVID 19 -Multifactorial, secondary to volume overload and infectious process due to COVID-19 pneumonia, she was on 6 L on presentation, weaned to 1 L yesterday, but she is again on 2 L nasal cannula today. -She is using incentive spirometry, out of bed to chair, will try awake prone today  Community-acquired pneumonia due to  COVID 19 -patient presenting with subjective fever, cough with yellow phlegm, chest x-ray showing infiltrate/pulmonary edema.  -Continue with IV Rocephin and azithromycin, continue with the Mucinex -COVID-19 test ulcerative, being treated with hydroxychloroquine and IV azithromycin, monitor QTC closely, magnesium >2, potassium>4,  CRP trending down, procalcitonin within normal limit -Continue with vitamin C and zinc.  Acute on chronic diastolic CHF -2D echo 2018 with a preserved EF, grade 2 diastolic CHF, evidence of volume overload on imaging and physical exam, continue with IV diuresis  Hypokalemia -repleted,  keep above 4 given he is on Plaquenil and Azithromycin  Hypertension -blood pressure  stable, continue metoprolol 50 mg twice a day.  Chronic pain syndrome -patient is on oxycodone 15 mg every 4 hours as needed for pain.   Code Status : Full  Family Communication  : Discussed with son via phone 10/08/2018  Disposition Plan  : home  When stable  Consults  :  None  Procedures  : none  DVT Prophylaxis  :  [place] lovenox  Lab Results  Component Value Date   PLT 311 10/09/2018    Antibiotics  :   Anti-infectives (From admission, onward)   Start     Dose/Rate Route Frequency Ordered Stop   10/07/18 2000  hydroxychloroquine (PLAQUENIL) tablet 200 mg     200 mg Oral 2 times daily 10/06/18 1424 10/11/18 1959   10/07/18 0800  azithromycin (ZITHROMAX) 500 mg in sodium chloride 0.9 % 250 mL IVPB     500 mg 250 mL/hr over 60 Minutes Intravenous Every 24 hours 10/06/18 0515     10/07/18 0800  cefTRIAXone (ROCEPHIN) 2 g in sodium chloride 0.9 % 100 mL IVPB     2 g 200 mL/hr over 30 Minutes Intravenous Every 24 hours 10/06/18 0753     10/07/18 0500  cefTRIAXone (ROCEPHIN) 1 g in sodium chloride 0.9 % 100 mL IVPB  Status:  Discontinued     1 g 200 mL/hr over 30 Minutes Intravenous Every 24 hours 10/06/18 0515 10/06/18 0753   10/06/18 1600  hydroxychloroquine (PLAQUENIL) tablet 400 mg     400 mg Oral 2 times daily 10/06/18 1424 10/07/18 0845   10/06/18 0415  cefTRIAXone (ROCEPHIN) 1 g in sodium chloride 0.9 % 100 mL IVPB     1 g 200 mL/hr over 30 Minutes Intravenous  Once 10/06/18 0412 10/06/18 0500   10/06/18 0415  azithromycin (ZITHROMAX) 500 mg in sodium chloride 0.9 % 250 mL IVPB     500 mg 250  mL/hr over 60 Minutes Intravenous  Once 10/06/18 0412 10/06/18 0625        Objective:   Vitals:   10/08/18 0818 10/08/18 1345 10/08/18 2105 10/09/18 0425  BP: 114/62 110/78 122/71 109/63  Pulse:  74 94 81  Resp:  18 19 20   Temp:  98 F (36.7 C) 98.3 F (36.8 C) 98.6 F (37 C)  TempSrc:  Oral Oral   SpO2:  94% 93% (!) 88%  Weight:      Height:        Wt Readings from Last 3 Encounters:  10/06/18 101.9 kg  09/03/18 109.8 kg  09/03/18 109.8 kg     Intake/Output Summary (Last 24 hours) at 10/09/2018 1401 Last data filed at 10/09/2018 0917 Gross per 24 hour  Intake 130.58 ml  Output 850 ml  Net -719.42 ml     Physical Exam  Awake Alert, Oriented X 3, No new F.N deficits, Normal affect Symmetrical Chest wall movement, Good air movement bilaterally, CTAB RRR,No Gallops,Rubs or new Murmurs, No Parasternal Heave +ve B.Sounds, Abd Soft, No tenderness, No rebound - guarding or rigidity. No Cyanosis, Clubbing or edema, No new Rash or bruise        Data Review:    CBC Recent Labs  Lab 10/05/18 1201 10/06/18 0318 10/07/18 0432 10/08/18 0335 10/09/18 0507  WBC 6.0 7.4 9.0 7.2 7.7  HGB 13.0 13.3 11.2* 11.1* 11.5*  HCT 42.1 42.0 36.6 36.5 36.5  PLT 203 233 241 282 311  MCV 85.6 86.6 88.0 89.0 87.3  MCH 26.4 27.4 26.9 27.1 27.5  MCHC 30.9  31.7 30.6 30.4 31.5  RDW 12.7 13.1 13.2 13.1 13.0  LYMPHSABS 0.9 0.8 1.4 1.5 1.6  MONOABS 1.1* 1.2* 2.3* 1.9* 1.9*  EOSABS 0.1 0.1 0.1 0.2 0.3  BASOSABS 0.0 0.0 0.0 0.1 0.1    Chemistries  Recent Labs  Lab 10/05/18 1201 10/06/18 0318 10/06/18 0516 10/07/18 0432 10/08/18 0335 10/09/18 0507  NA 136 135  --  130* 136 138  K 3.3* 3.2*  --  3.8 3.7 3.8  CL 101 99  --  97* 101 101  CO2 21* 24  --  23 23 25   GLUCOSE 93 130*  --  101* 80 73  BUN 7* 11  --  11 9 9   CREATININE 0.83 0.88 0.87 0.81 0.70 0.72  CALCIUM 8.7* 8.5*  --  7.8* 8.2* 8.4*  MG  --   --   --   --  2.0  --   AST 25  --   --  22 20 19   ALT 15  --   --   11 10 13   ALKPHOS 84  --   --  72 70 72  BILITOT 1.0  --   --  0.8 0.6 0.6   ------------------------------------------------------------------------------------------------------------------ No results for input(s): CHOL, HDL, LDLCALC, TRIG, CHOLHDL, LDLDIRECT in the last 72 hours.  Lab Results  Component Value Date   HGBA1C 5.4 09/03/2018   ------------------------------------------------------------------------------------------------------------------ No results for input(s): TSH, T4TOTAL, T3FREE, THYROIDAB in the last 72 hours.  Invalid input(s): FREET3 ------------------------------------------------------------------------------------------------------------------ No results for input(s): VITAMINB12, FOLATE, FERRITIN, TIBC, IRON, RETICCTPCT in the last 72 hours.  Coagulation profile No results for input(s): INR, PROTIME in the last 168 hours.  No results for input(s): DDIMER in the last 72 hours.  Cardiac Enzymes Recent Labs  Lab 10/05/18 1201 10/06/18 0516  TROPONINI <0.03 0.05*   ------------------------------------------------------------------------------------------------------------------    Component Value Date/Time   BNP 34.6 10/06/2018 0319    Inpatient Medications  Scheduled Meds: . amitriptyline  100 mg Oral QHS  . aspirin EC  81 mg Oral Daily  . cholecalciferol  2,000 Units Oral Daily  . citalopram  20 mg Oral Daily  . enoxaparin (LOVENOX) injection  50 mg Subcutaneous Q24H  . famotidine  20 mg Oral QHS  . furosemide  40 mg Intravenous Daily  . gabapentin  1,200 mg Oral BID  . gabapentin  800 mg Oral QHS  . guaiFENesin  1,200 mg Oral BID  . hydroxychloroquine  200 mg Oral BID  . loratadine  10 mg Oral Daily  . metoprolol tartrate  50 mg Oral BID  . vitamin C  500 mg Oral BID  . zinc sulfate  220 mg Oral Daily   Continuous Infusions: . sodium chloride    . sodium chloride Stopped (10/08/18 1426)  . azithromycin 500 mg (10/09/18 0917)  .  cefTRIAXone (ROCEPHIN)  IV 2 g (10/09/18 0840)   PRN Meds:.sodium chloride, sodium chloride, albuterol, fluticasone, oxyCODONE  Micro Results Recent Results (from the past 240 hour(s))  Novel Coronavirus, NAA (hospital order; send-out to ref lab)     Status: Abnormal   Collection Time: 10/06/18  5:42 AM  Result Value Ref Range Status   SARS-CoV-2, NAA DETECTED (A) NOT DETECTED Final    Comment: Positive (Detected) results are indicative of active infection with SARS CoV 2. A positive result does not rule out bacterial infection or coinfection with other viruses. Detection of SARS CoV 2 viral RNA may not indicate that SARS CoV 2 is the causative agent  for clinical symptoms. Positive and negative predictive values of testing are highly dependent on prevalence. False positive test results are more likely when prevalence is moderate to low. RESULT CALLED TO, READ BACK BY AND VERIFIED WITH: L. CURRIN RN 10/08/2018 1648 BEAMJ (NOTE) The expected result is Negative (Not Detected). The SARS CoV 2 test is intended for the presumptive qualitative  detection of nucleic acid from SARS CoV 2 in upper and lower  respiratory specimens. Testing methodology is real time RT PCR. Test results must be correlated with clinical presentation and  evaluated in the context of other laboratory and epidemiologic data.  Test performance can be affected because the epidemiology and  clinical  spectrum of infection caused by SARS CoV 2 is not fully  known. For example, the optimum types of specimens to collect and  when during the course of infection these specimens are most likely  to contain detectable viral RNA may not be known. This test has not been Food and Drug Administration (FDA) cleared or  approved and has been authorized by FDA under an Emergency Use  Authorization (EUA). The test is only authorized for the duration of  the declaration that circumstances exist justifying the authorization  of emergency  use of in vitro diagnostic tests for detection and or  diagnosis of SARS CoV 2 under Section 564(b)(1) of the Act, 21 U.S.C.  section [phone] 3(b)(1), unless the authorization is terminated or  revoked sooner. [facility] is certified under the  Clinical Laboratory Improvement Amendments of 1988 (CLIA), 42 U.S.C.  section [phone], to perform high complexity tests. Performed at [facility] CLIA [id number] [address], [place], [place] [address] Laboratory Director: Turner Daniels, MD Fact Sheet for Healthcare Providers  https://pope.com/ Fact Sheet for Patients  [email or web address] Performed at [facility], [address] [place], [place] [address]    Coronavirus Source NASOPHARYNGEAL  Final    Comment: Performed at [facility], [address] [place], [place] [address]  Respiratory Panel by PCR     Status: None   Collection Time: 10/06/18  5:42 AM  Result Value Ref Range Status   Adenovirus NOT DETECTED NOT DETECTED Final   Coronavirus 229E NOT DETECTED NOT DETECTED Final    Comment: (NOTE) The Coronavirus on the Respiratory Panel, DOES NOT test for the novel  Coronavirus (2019 nCoV)    Coronavirus HKU1 NOT DETECTED NOT DETECTED Final   Coronavirus NL63 NOT DETECTED NOT DETECTED Final   Coronavirus OC43 NOT DETECTED NOT DETECTED Final   Metapneumovirus NOT DETECTED NOT DETECTED Final   Rhinovirus / Enterovirus NOT DETECTED NOT DETECTED Final   Influenza A NOT DETECTED NOT DETECTED Final   Influenza B NOT DETECTED NOT DETECTED Final   Parainfluenza Virus 1 NOT DETECTED NOT DETECTED Final   Parainfluenza Virus 2 NOT DETECTED NOT DETECTED Final   Parainfluenza Virus 3 NOT DETECTED NOT DETECTED Final   Parainfluenza Virus 4 NOT DETECTED NOT DETECTED Final   Respiratory Syncytial Virus NOT DETECTED NOT DETECTED Final   Bordetella pertussis NOT DETECTED  NOT DETECTED Final   Chlamydophila pneumoniae NOT DETECTED NOT DETECTED Final   Mycoplasma pneumoniae NOT DETECTED NOT DETECTED Final    Comment: Performed at [facility], [address], [place], [place] [address]  Culture, blood (Routine X 2) w Reflex to ID Panel     Status: None (Preliminary result)   Collection Time: 10/06/18  5:43 AM  Result Value Ref Range Status   Specimen Description   Final    BLOOD LEFT ANTECUBITAL Performed at [facility], [address], [place], [place] [address]    Special Requests   Final    BOTTLES DRAWN AEROBIC AND ANAEROBIC Blood Culture results may not be optimal due to an excessive volume of blood received in culture bottles Performed at [facility], [address] [place], [place] [address]    Culture   Final    NO GROWTH 3 DAYS Performed at [facility], [address], [place], [place] [address]    Report Status PENDING  Incomplete  Culture, blood (Routine X 2) w Reflex to ID Panel     Status: None (Preliminary result)   Collection Time: 10/06/18  5:44 AM  Result Value Ref Range Status   Specimen Description   Final    BLOOD BLOOD LEFT FOREARM Performed at [facility], [address] [place], [place] [address]    Special Requests   Final    BOTTLES DRAWN AEROBIC AND ANAEROBIC Blood Culture results may not be optimal due to an inadequate volume of blood received in culture bottles Performed at [facility], [address], [place], [place] [address]    Culture   Final    NO GROWTH 3 DAYS Performed at [facility], [address] [place], [place] [address]    Report Status PENDING  Incomplete    Radiology Reports Dg Chest Port 1 View  Result Date: 10/06/2018 CLINICAL DATA:  Initial evaluation for acute shortness of breath, fever, chills. EXAM: PORTABLE CHEST 1 VIEW COMPARISON:  Prior radiograph from 10/05/2018. FINDINGS: Mild cardiomegaly,  stable. Mediastinal silhouette within normal limits. Lungs hypoinflated with elevation left hemidiaphragm, similar to previous. Diffusely increased pulmonary vascular congestion with interstitial prominence, compatible with pulmonary interstitial edema. Superimposed linear atelectasis and/or scarring within the mid and lower lungs. There is increased confluent opacity at the left lung base, suspicious for infiltrate given history of fever. No pleural effusion. No pneumothorax. No acute osseous finding. IMPRESSION: 1. Cardiomegaly with mild diffuse pulmonary interstitial edema. 2. Superimposed confluent opacity at the left lung base, increased from previous, suspicious for possible infiltrate given provided history of fever. Electronically Signed   By: Rise Mu M.D.   On: 10/06/2018 03:41   Dg Chest Portable 1 View  Result Date: 10/05/2018 CLINICAL DATA:  9-10 days ago had body aches and a "Light fever" with a cough, hx of asthma, called her PCP and they told her she may have covid and to self quarantine, has been doing that for 8 days and has not gotten better, using albuterol inhaler without improvement. EXAM: PORTABLE CHEST 1 VIEW COMPARISON:  03/03/2018 FINDINGS: Cardiac silhouette is normal in size. No mediastinal or hilar masses. There is no evidence of adenopathy. Lungs show prominent bronchovascular markings. There is linear opacity in the mid lungs and lung bases consistent with atelectasis and/or scarring. A component of atelectasis is favored since there has been an increase since the prior radiographs. No lung consolidation. No convincing pleural effusion and no pneumothorax. Skeletal structures are grossly intact. IMPRESSION: 1. No convincing pneumonia. 2. Linear/reticular type opacities in the lung bases and mid lungs consistent with scarring and atelectasis. Chronic prominence of the bronchovascular markings bilaterally. Electronically Signed   By: Amie Portland M.D.   On: 10/05/2018  12:23     Huey Bienenstock M.D on 10/09/2018 at 2:01 PM  Between 7am to 7pm - Pager - [phone]  After 7pm go to [email or web address] - password [facility]  [facility] -  Office  [phone]

## 2018-10-09 NOTE — Plan of Care (Signed)
  Problem: Health Behavior/Discharge Planning: Goal: Ability to manage health-related needs will improve Outcome: Progressing   Problem: Clinical Measurements: Goal: Ability to maintain clinical measurements within normal limits will improve Outcome: Progressing Goal: Will remain free from infection Outcome: Progressing Goal: Diagnostic test results will improve Outcome: Progressing Goal: Respiratory complications will improve Outcome: Progressing Goal: Cardiovascular complication will be avoided Outcome: Progressing   Problem: Activity: Goal: Risk for activity intolerance will decrease Outcome: Progressing   Problem: Nutrition: Goal: Adequate nutrition will be maintained Outcome: Progressing   Problem: Coping: Goal: Level of anxiety will decrease Outcome: Progressing   Problem: Elimination: Goal: Will not experience complications related to bowel motility Outcome: Progressing Goal: Will not experience complications related to urinary retention Outcome: Progressing   Problem: Skin Integrity: Goal: Risk for impaired skin integrity will decrease Outcome: Progressing   

## 2018-10-10 LAB — COMPREHENSIVE METABOLIC PANEL
ALT: 14 U/L (ref 0–44)
AST: 21 U/L (ref 15–41)
Albumin: 3 g/dL — ABNORMAL LOW (ref 3.5–5.0)
Alkaline Phosphatase: 78 U/L (ref 38–126)
Anion gap: 14 (ref 5–15)
BUN: 14 mg/dL (ref 8–23)
CO2: 26 mmol/L (ref 22–32)
Calcium: 9.1 mg/dL (ref 8.9–10.3)
Chloride: 98 mmol/L (ref 98–111)
Creatinine, Ser: 0.72 mg/dL (ref 0.44–1.00)
GFR calc Af Amer: >60 mL/min (ref >60)
GFR calc non Af Amer: >60 mL/min (ref >60)
Glucose, Bld: 89 mg/dL (ref 70–99)
Potassium: 4 mmol/L (ref 3.5–5.1)
Sodium: 138 mmol/L (ref 135–145)
Total Bilirubin: 0.5 mg/dL (ref 0.3–1.2)
Total Protein: 7.5 g/dL (ref 6.5–8.1)

## 2018-10-10 LAB — C-REACTIVE PROTEIN: CRP: 5.5 mg/dL — ABNORMAL HIGH (ref <1.0)

## 2018-10-10 LAB — CBC WITH DIFFERENTIAL/PLATELET
Abs Immature Granulocytes: 0.45 10*3/uL — ABNORMAL HIGH (ref 0.00–0.07)
Basophils Absolute: 0.1 10*3/uL (ref 0.0–0.1)
Basophils Relative: 1 %
Eosinophils Absolute: 0.3 10*3/uL (ref 0.0–0.5)
Eosinophils Relative: 3 %
HCT: 37.7 % (ref 36.0–46.0)
Hemoglobin: 11.9 g/dL — ABNORMAL LOW (ref 12.0–15.0)
Immature Granulocytes: 6 %
Lymphocytes Relative: 23 %
Lymphs Abs: 1.9 10*3/uL (ref 0.7–4.0)
MCH: 27.9 pg (ref 26.0–34.0)
MCHC: 31.6 g/dL (ref 30.0–36.0)
MCV: 88.5 fL (ref 80.0–100.0)
Monocytes Absolute: 1.8 10*3/uL — ABNORMAL HIGH (ref 0.1–1.0)
Monocytes Relative: 23 %
Neutro Abs: 3.5 10*3/uL (ref 1.7–7.7)
Neutrophils Relative %: 44 %
Platelets: 371 10*3/uL (ref 150–400)
RBC: 4.26 MIL/uL (ref 3.87–5.11)
RDW: 13.2 % (ref 11.5–15.5)
WBC: 8 10*3/uL (ref 4.0–10.5)
nRBC: 0 % (ref 0.0–0.2)

## 2018-10-10 LAB — FERRITIN: Ferritin: 248 ng/mL (ref 11–307)

## 2018-10-10 LAB — MAGNESIUM: Magnesium: 2.2 mg/dL (ref 1.7–2.4)

## 2018-10-10 LAB — LACTATE DEHYDROGENASE: LDH: 267 U/L — ABNORMAL HIGH (ref 98–192)

## 2018-10-10 MED ORDER — FUROSEMIDE 10 MG/ML IJ SOLN
60.0000 mg | Freq: Once | INTRAMUSCULAR | Status: AC
  Administered 2018-10-10: 60 mg via INTRAVENOUS
  Filled 2018-10-10: qty 6

## 2018-10-10 NOTE — Progress Notes (Signed)
PROGRESS NOTE                                                                                                                                                                                                             Patient Demographics:    Melinda [name], is a 68 y.o. female, DOB - 06/03/1951, YNW:[id number]  Admit date - 10/06/2018   Admitting Physician Meredeth Ide, MD  Outpatient Primary MD for the patient is [name], [name], NP  LOS - 4   Chief Complaint  Patient presents with  . Shortness of Breath       Brief Narrative    68 y.o. female, with history of asthma, pulmonary hypertension, chronic diastolic CHF came to ED with worsening shortness of breath.    Had an ED visit before 3 days, where she was hypoxic where she was discharged on home oxygen 2 L, she was back on 6 L upon this admission, work-up significant for volume overload, and pneumonia, admitted for further work-up.  She was COVID-19 positive   -O2 sats 80%.  In the ED  she is requiring 6 L/min of oxygen.    Subjective:    Melinda [name] today ports she is feeling better, still reports cough, less productive today, he has improved, able to sit couple hours in the recliner yesterday, has been doing some awake prone as well.   Assessment  & Plan :    Active Problems:   CAP (community acquired pneumonia)  Acute hypoxic respiratory failure due to COVID 19 -Multifactorial, secondary to volume overload and infectious process due to COVID-19 pneumonia, she was on 6 L on presentation, remains on 2 L nasal cannula today, encouraged to use incentive spirometry, out of bed to chair, and awake prone .  Community-acquired pneumonia due to  COVID 19 -patient presenting with subjective fever, cough with yellow phlegm, chest x-ray showing infiltrate/pulmonary edema.  -She is treated with IV Rocephin and azithromycin for the pneumonia, today is day 5, will discontinue today. -She is on  Plaquenil, today will be day #5, QTC is 440.  Monitor potassium and magnesium closely. -Continue with vitamin C and zinc.  Acute on chronic diastolic CHF -2D echo 2018 with a preserved EF, grade 2 diastolic CHF, evidence of volume overload on imaging and physical exam, continue with IV diuresis, I will  give extra dose of 60 mg IV Lasix today  Hypokalemia -repleted,  keep above 4 given he is on Plaquenil and Azithromycin  Hypertension -blood pressure stable, continue metoprolol 50 mg twice a day.  Chronic pain syndrome -patient is on oxycodone 15 mg every 4 hours as needed for pain.   Code Status : Full  Family Communication  : Discussed with son via phone 10/10/2018  Disposition Plan  : home  When stable  Consults  :  None  Procedures  : none  DVT Prophylaxis  :  [place] lovenox  Lab Results  Component Value Date   PLT 371 10/10/2018    Antibiotics  :   Anti-infectives (From admission, onward)   Start     Dose/Rate Route Frequency Ordered Stop   10/07/18 2000  hydroxychloroquine (PLAQUENIL) tablet 200 mg     200 mg Oral 2 times daily 10/06/18 1424 10/11/18 1959   10/07/18 0800  azithromycin (ZITHROMAX) 500 mg in sodium chloride 0.9 % 250 mL IVPB     500 mg 250 mL/hr over 60 Minutes Intravenous Every 24 hours 10/06/18 0515     10/07/18 0800  cefTRIAXone (ROCEPHIN) 2 g in sodium chloride 0.9 % 100 mL IVPB     2 g 200 mL/hr over 30 Minutes Intravenous Every 24 hours 10/06/18 0753     10/07/18 0500  cefTRIAXone (ROCEPHIN) 1 g in sodium chloride 0.9 % 100 mL IVPB  Status:  Discontinued     1 g 200 mL/hr over 30 Minutes Intravenous Every 24 hours 10/06/18 0515 10/06/18 0753   10/06/18 1600  hydroxychloroquine (PLAQUENIL) tablet 400 mg     400 mg Oral 2 times daily 10/06/18 1424 10/07/18 0845   10/06/18 0415  cefTRIAXone (ROCEPHIN) 1 g in sodium chloride 0.9 % 100 mL IVPB     1 g 200 mL/hr over 30 Minutes Intravenous  Once 10/06/18 0412 10/06/18 0500   10/06/18 0415   azithromycin (ZITHROMAX) 500 mg in sodium chloride 0.9 % 250 mL IVPB     500 mg 250 mL/hr over 60 Minutes Intravenous  Once 10/06/18 0412 10/06/18 0625        Objective:   Vitals:   10/09/18 1958 10/10/18 0432 10/10/18 1115 10/10/18 1228  BP: 124/77 121/63 116/77 116/80  Pulse: 100 93 86 87  Resp: (!) 21 17 18  (!) 21  Temp: 98.1 F (36.7 C) 98.4 F (36.9 C) 97.8 F (36.6 C) 98.2 F (36.8 C)  TempSrc:   Oral   SpO2: 93% 97% 94% 97%  Weight:      Height:        Wt Readings from Last 3 Encounters:  10/06/18 101.9 kg  09/03/18 109.8 kg  09/03/18 109.8 kg     Intake/Output Summary (Last 24 hours) at 10/10/2018 1305 Last data filed at 10/10/2018 1231 Gross per 24 hour  Intake 600 ml  Output 600 ml  Net 0 ml     Physical Exam  Awake Alert, Oriented X 3, No new F.N deficits, Normal affect Symmetrical Chest wall movement, Good air movement bilaterally, CTAB RRR,No Gallops,Rubs or new Murmurs, No Parasternal Heave +ve B.Sounds, Abd Soft, No tenderness, No rebound - guarding or rigidity. No Cyanosis, Clubbing or edema, No new Rash or bruise         Data Review:    CBC Recent Labs  Lab 10/06/18 0318 10/07/18 0432 10/08/18 0335 10/09/18 0507 10/10/18 0402  WBC 7.4 9.0 7.2 7.7 8.0  HGB 13.3 11.2* 11.1* 11.5*  11.9*  HCT 42.0 36.6 36.5 36.5 37.7  PLT 233 241 282 311 371  MCV 86.6 88.0 89.0 87.3 88.5  MCH 27.4 26.9 27.1 27.5 27.9  MCHC 31.7 30.6 30.4 31.5 31.6  RDW 13.1 13.2 13.1 13.0 13.2  LYMPHSABS 0.8 1.4 1.5 1.6 1.9  MONOABS 1.2* 2.3* 1.9* 1.9* 1.8*  EOSABS 0.1 0.1 0.2 0.3 0.3  BASOSABS 0.0 0.0 0.1 0.1 0.1    Chemistries  Recent Labs  Lab 10/05/18 1201 10/06/18 0318 10/06/18 0516 10/07/18 0432 10/08/18 0335 10/09/18 0507 10/10/18 0402  NA 136 135  --  130* 136 138 138  K 3.3* 3.2*  --  3.8 3.7 3.8 4.0  CL 101 99  --  97* 101 101 98  CO2 21* 24  --  23 23 25 26   GLUCOSE 93 130*  --  101* 80 73 89  BUN 7* 11  --  11 9 9 14   CREATININE 0.83  0.88 0.87 0.81 0.70 0.72 0.72  CALCIUM 8.7* 8.5*  --  7.8* 8.2* 8.4* 9.1  MG  --   --   --   --  2.0  --  2.2  AST 25  --   --  22 20 19 21   ALT 15  --   --  11 10 13 14   ALKPHOS 84  --   --  72 70 72 78  BILITOT 1.0  --   --  0.8 0.6 0.6 0.5   ------------------------------------------------------------------------------------------------------------------ No results for input(s): CHOL, HDL, LDLCALC, TRIG, CHOLHDL, LDLDIRECT in the last 72 hours.  Lab Results  Component Value Date   HGBA1C 5.4 09/03/2018   ------------------------------------------------------------------------------------------------------------------ No results for input(s): TSH, T4TOTAL, T3FREE, THYROIDAB in the last 72 hours.  Invalid input(s): FREET3 ------------------------------------------------------------------------------------------------------------------ Recent Labs    10/10/18 0402  FERRITIN 248    Coagulation profile No results for input(s): INR, PROTIME in the last 168 hours.  No results for input(s): DDIMER in the last 72 hours.  Cardiac Enzymes Recent Labs  Lab 10/05/18 1201 10/06/18 0516  TROPONINI <0.03 0.05*   ------------------------------------------------------------------------------------------------------------------    Component Value Date/Time   BNP 34.6 10/06/2018 0319    Inpatient Medications  Scheduled Meds: . amitriptyline  100 mg Oral QHS  . aspirin EC  81 mg Oral Daily  . cholecalciferol  2,000 Units Oral Daily  . citalopram  20 mg Oral Daily  . enoxaparin (LOVENOX) injection  50 mg Subcutaneous Q24H  . famotidine  20 mg Oral QHS  . furosemide  40 mg Intravenous Daily  . gabapentin  1,200 mg Oral BID  . gabapentin  800 mg Oral QHS  . guaiFENesin  1,200 mg Oral BID  . hydroxychloroquine  200 mg Oral BID  . loratadine  10 mg Oral Daily  . metoprolol tartrate  50 mg Oral BID  . vitamin C  500 mg Oral BID  . zinc sulfate  220 mg Oral Daily   Continuous  Infusions: . sodium chloride    . sodium chloride Stopped (10/08/18 1426)  . azithromycin 500 mg (10/10/18 1303)  . cefTRIAXone (ROCEPHIN)  IV 2 g (10/10/18 1106)   PRN Meds:.sodium chloride, sodium chloride, albuterol, fluticasone, oxyCODONE  Micro Results Recent Results (from the past 240 hour(s))  Novel Coronavirus, NAA (hospital order; send-out to ref lab)     Status: Abnormal   Collection Time: 10/06/18  5:42 AM  Result Value Ref Range Status   SARS-CoV-2, NAA DETECTED (A) NOT DETECTED Final    Comment:  Positive (Detected) results are indicative of active infection with SARS CoV 2. A positive result does not rule out bacterial infection or coinfection with other viruses. Detection of SARS CoV 2 viral RNA may not indicate that SARS CoV 2 is the causative agent for clinical symptoms. Positive and negative predictive values of testing are highly dependent on prevalence. False positive test results are more likely when prevalence is moderate to low. RESULT CALLED TO, READ BACK BY AND VERIFIED WITH: L. CURRIN RN 10/08/2018 1648 BEAMJ (NOTE) The expected result is Negative (Not Detected). The SARS CoV 2 test is intended for the presumptive qualitative  detection of nucleic acid from SARS CoV 2 in upper and lower  respiratory specimens. Testing methodology is real time RT PCR. Test results must be correlated with clinical presentation and  evaluated in the context of other laboratory and epidemiologic data.  Test performance can be affected because the epidemiology and  clinical  spectrum of infection caused by SARS CoV 2 is not fully  known. For example, the optimum types of specimens to collect and  when during the course of infection these specimens are most likely  to contain detectable viral RNA may not be known. This test has not been Food and Drug Administration (FDA) cleared or  approved and has been authorized by FDA under an Emergency Use  Authorization (EUA). The test is  only authorized for the duration of  the declaration that circumstances exist justifying the authorization  of emergency use of in vitro diagnostic tests for detection and or  diagnosis of SARS CoV 2 under Section 564(b)(1) of the Act, 21 U.S.C.  section [phone] 3(b)(1), unless the authorization is terminated or  revoked sooner. [facility] is certified under the  Clinical Laboratory Improvement Amendments of 1988 (CLIA), 42 U.S.C.  section [phone], to perform high complexity tests. Performed at [facility] CLIA [id number] [address], [place], [place] [address] Laboratory Director: Turner Daniels, MD Fact Sheet for Healthcare Providers  https://pope.com/ Fact Sheet for Patients  [email or web address] Performed at [facility], [address] [place], [place] [address]    Coronavirus Source NASOPHARYNGEAL  Final    Comment: Performed at [facility], [address], [place], [place] [address]  Respiratory Panel by PCR     Status: None   Collection Time: 10/06/18  5:42 AM  Result Value Ref Range Status   Adenovirus NOT DETECTED NOT DETECTED Final   Coronavirus 229E NOT DETECTED NOT DETECTED Final    Comment: (NOTE) The Coronavirus on the Respiratory Panel, DOES NOT test for the novel  Coronavirus (2019 nCoV)    Coronavirus HKU1 NOT DETECTED NOT DETECTED Final   Coronavirus NL63 NOT DETECTED NOT DETECTED Final   Coronavirus OC43 NOT DETECTED NOT DETECTED Final   Metapneumovirus NOT DETECTED NOT DETECTED Final   Rhinovirus / Enterovirus NOT DETECTED NOT DETECTED Final   Influenza A NOT DETECTED NOT DETECTED Final   Influenza B NOT DETECTED NOT DETECTED Final   Parainfluenza Virus 1 NOT DETECTED NOT DETECTED Final   Parainfluenza Virus 2 NOT DETECTED NOT DETECTED Final   Parainfluenza Virus 3 NOT DETECTED NOT DETECTED Final   Parainfluenza Virus 4 NOT  DETECTED NOT DETECTED Final   Respiratory Syncytial Virus NOT DETECTED NOT DETECTED Final   Bordetella pertussis NOT DETECTED NOT DETECTED Final   Chlamydophila pneumoniae NOT DETECTED NOT DETECTED Final   Mycoplasma pneumoniae NOT DETECTED NOT DETECTED Final  Comment: Performed at [facility], [address] [place], [place] [address]  Culture, blood (Routine X 2) w Reflex to ID Panel     Status: None (Preliminary result)   Collection Time: 10/06/18  5:43 AM  Result Value Ref Range Status   Specimen Description   Final    BLOOD LEFT ANTECUBITAL Performed at [facility], [address], [place], [place] [address]    Special Requests   Final    BOTTLES DRAWN AEROBIC AND ANAEROBIC Blood Culture results may not be optimal due to an excessive volume of blood received in culture bottles Performed at [facility], [address], [place], [place] [address]    Culture   Final    NO GROWTH 4 DAYS Performed at [facility], [address], [place], [place] [address]    Report Status PENDING  Incomplete  Culture, blood (Routine X 2) w Reflex to ID Panel     Status: None (Preliminary result)   Collection Time: 10/06/18  5:44 AM  Result Value Ref Range Status   Specimen Description   Final    BLOOD BLOOD LEFT FOREARM Performed at [facility], [address], [place], [place] [address]    Special Requests   Final    BOTTLES DRAWN AEROBIC AND ANAEROBIC Blood Culture results may not be optimal due to an inadequate volume of blood received in culture bottles Performed at [facility], [address], [place], [place] [address]    Culture   Final    NO GROWTH 4 DAYS Performed at [facility], [address], [place], [place] [address]    Report Status PENDING  Incomplete    Radiology Reports Dg Chest Port 1 View  Result Date: 10/06/2018 CLINICAL DATA:  Initial evaluation for acute shortness of  breath, fever, chills. EXAM: PORTABLE CHEST 1 VIEW COMPARISON:  Prior radiograph from 10/05/2018. FINDINGS: Mild cardiomegaly, stable. Mediastinal silhouette within normal limits. Lungs hypoinflated with elevation left hemidiaphragm, similar to previous. Diffusely increased pulmonary vascular congestion with interstitial prominence, compatible with pulmonary interstitial edema. Superimposed linear atelectasis and/or scarring within the mid and lower lungs. There is increased confluent opacity at the left lung base, suspicious for infiltrate given history of fever. No pleural effusion. No pneumothorax. No acute osseous finding. IMPRESSION: 1. Cardiomegaly with mild diffuse pulmonary interstitial edema. 2. Superimposed confluent opacity at the left lung base, increased from previous, suspicious for possible infiltrate given provided history of fever. Electronically Signed   By: Rise Mu M.D.   On: 10/06/2018 03:41   Dg Chest Portable 1 View  Result Date: 10/05/2018 CLINICAL DATA:  9-10 days ago had body aches and a "Light fever" with a cough, hx of asthma, called her PCP and they told her she may have covid and to self quarantine, has been doing that for 8 days and has not gotten better, using albuterol inhaler without improvement. EXAM: PORTABLE CHEST 1 VIEW COMPARISON:  03/03/2018 FINDINGS: Cardiac silhouette is normal in size. No mediastinal or hilar masses. There is no evidence of adenopathy. Lungs show prominent bronchovascular markings. There is linear opacity in the mid lungs and lung bases consistent with atelectasis and/or scarring. A component of atelectasis is favored since there has been an increase since the prior radiographs. No lung consolidation. No convincing pleural effusion and no pneumothorax. Skeletal structures are grossly intact. IMPRESSION: 1. No convincing pneumonia. 2. Linear/reticular type opacities in the lung bases  and mid lungs consistent with scarring and atelectasis.  Chronic prominence of the bronchovascular markings bilaterally. Electronically Signed   By: Amie Portland M.D.   On: 10/05/2018 12:23     Huey Bienenstock M.D on 10/10/2018 at 1:05 PM  Between 7am to 7pm - Pager - [phone]  After 7pm go to [email or web address] - password [facility]  [facility] -  Office  [phone]

## 2018-10-10 NOTE — Progress Notes (Signed)
Physical Therapy Treatment Patient Details Name: Melinda ClinesDebra [name] MRN: [id number] DOB: 1951-03-04 Today's Date: 10/10/2018    History of Present Illness 68 y.o. female, with history of asthma, pulmonary hypertension, chronic diastolic CHF came to ED with worsening shortness of breath    PT Comments    Progressing with mobility.    Follow Up Recommendations  Home health PT     Equipment Recommendations  None recommended by PT    Recommendations for Other Services       Precautions / Restrictions Precautions Precautions: Fall Precaution Comments: monitor O2 Restrictions Weight Bearing Restrictions: No    Mobility  Bed Mobility               General bed mobility comments: oob in recliner  Transfers Overall transfer level: Needs assistance Equipment used: None Transfers: Sit to/from Stand Sit to Stand: Min guard         General transfer comment: close guard for safety.   Ambulation/Gait Ambulation/Gait assistance: Min guard Gait Distance (Feet): 45 Feet Assistive device: IV Pole Gait Pattern/deviations: Step-through pattern     General Gait Details: Unsteady especially without UE support-LOB x 2. Pt walked 2 laps with IV pole, 2 laps without UE support.   Stairs             Wheelchair Mobility    Modified Rankin (Stroke Patients Only)       Balance Overall balance assessment: Needs assistance           Standing balance-Leahy Scale: Fair                              Cognition Arousal/Alertness: Awake/alert Behavior During Therapy: WFL for tasks assessed/performed Overall Cognitive Status: Within Functional Limits for tasks assessed                                        Exercises      General Comments        Pertinent Vitals/Pain Pain Assessment: Faces Faces Pain Scale: Hurts little more Pain Location: stomach Pain Descriptors / Indicators: Sore;Aching Pain Intervention(s): Monitored during  session    Home Living                      Prior Function            PT Goals (current goals can now be found in the care plan section) Progress towards PT goals: Progressing toward goals    Frequency    Min 3X/week      PT Plan Current plan remains appropriate    Co-evaluation              AM-PAC PT "6 Clicks" Mobility   Outcome Measure  Help needed turning from your back to your side while in a flat bed without using bedrails?: None Help needed moving from lying on your back to sitting on the side of a flat bed without using bedrails?: A Little Help needed moving to and from a bed to a chair (including a wheelchair)?: A Little Help needed standing up from a chair using your arms (e.g., wheelchair or bedside chair)?: A Little Help needed to walk in hospital room?: A Little Help needed climbing 3-5 steps with a railing? : A Little 6 Click Score: 19    End of Session Equipment Utilized During  Treatment: Oxygen Activity Tolerance: Patient tolerated treatment well Patient left: in chair;with call bell/phone within reach   PT Visit Diagnosis: Difficulty in walking, not elsewhere classified (R26.2);Muscle weakness (generalized) (M62.81)     Time: 1121-6244 PT Time Calculation (min) (ACUTE ONLY): 16 min  Charges:  $Gait Training: 8-22 mins                        Rebeca Alert, PT Acute Rehabilitation Services Pager: [phone] Office: [phone]

## 2018-10-11 LAB — CBC WITH DIFFERENTIAL/PLATELET
Abs Immature Granulocytes: 0.54 10*3/uL — ABNORMAL HIGH (ref 0.00–0.07)
Basophils Absolute: 0.1 10*3/uL (ref 0.0–0.1)
Basophils Relative: 1 %
Eosinophils Absolute: 0.3 10*3/uL (ref 0.0–0.5)
Eosinophils Relative: 3 %
HCT: 37.8 % (ref 36.0–46.0)
Hemoglobin: 12.1 g/dL (ref 12.0–15.0)
Immature Granulocytes: 6 %
Lymphocytes Relative: 21 %
Lymphs Abs: 1.8 10*3/uL (ref 0.7–4.0)
MCH: 28.5 pg (ref 26.0–34.0)
MCHC: 32 g/dL (ref 30.0–36.0)
MCV: 88.9 fL (ref 80.0–100.0)
Monocytes Absolute: 1.9 10*3/uL — ABNORMAL HIGH (ref 0.1–1.0)
Monocytes Relative: 22 %
Neutro Abs: 4 10*3/uL (ref 1.7–7.7)
Neutrophils Relative %: 47 %
Platelets: 428 10*3/uL — ABNORMAL HIGH (ref 150–400)
RBC: 4.25 MIL/uL (ref 3.87–5.11)
RDW: 13.1 % (ref 11.5–15.5)
WBC: 8.6 10*3/uL (ref 4.0–10.5)
nRBC: 0 % (ref 0.0–0.2)

## 2018-10-11 LAB — COMPREHENSIVE METABOLIC PANEL
ALT: 14 U/L (ref 0–44)
AST: 19 U/L (ref 15–41)
Albumin: 3.2 g/dL — ABNORMAL LOW (ref 3.5–5.0)
Alkaline Phosphatase: 71 U/L (ref 38–126)
Anion gap: 14 (ref 5–15)
BUN: 16 mg/dL (ref 8–23)
CO2: 25 mmol/L (ref 22–32)
Calcium: 8.9 mg/dL (ref 8.9–10.3)
Chloride: 96 mmol/L — ABNORMAL LOW (ref 98–111)
Creatinine, Ser: 0.87 mg/dL (ref 0.44–1.00)
GFR calc Af Amer: >60 mL/min (ref >60)
GFR calc non Af Amer: >60 mL/min (ref >60)
Glucose, Bld: 90 mg/dL (ref 70–99)
Potassium: 3.6 mmol/L (ref 3.5–5.1)
Sodium: 135 mmol/L (ref 135–145)
Total Bilirubin: 0.7 mg/dL (ref 0.3–1.2)
Total Protein: 7.5 g/dL (ref 6.5–8.1)

## 2018-10-11 LAB — CULTURE, BLOOD (ROUTINE X 2)
Culture: NO GROWTH
Culture: NO GROWTH

## 2018-10-11 LAB — C-REACTIVE PROTEIN: CRP: 2.9 mg/dL — ABNORMAL HIGH (ref <1.0)

## 2018-10-11 LAB — MAGNESIUM: Magnesium: 2.2 mg/dL (ref 1.7–2.4)

## 2018-10-11 MED ORDER — POTASSIUM CHLORIDE CRYS ER 20 MEQ PO TBCR
40.0000 meq | EXTENDED_RELEASE_TABLET | Freq: Two times a day (BID) | ORAL | Status: AC
  Administered 2018-10-11 – 2018-10-12 (×3): 40 meq via ORAL
  Filled 2018-10-11 (×2): qty 2

## 2018-10-11 MED ORDER — ENSURE ENLIVE PO LIQD
237.0000 mL | Freq: Two times a day (BID) | ORAL | Status: DC
  Administered 2018-10-12: 237 mL via ORAL

## 2018-10-11 MED ORDER — FUROSEMIDE 10 MG/ML IJ SOLN
60.0000 mg | Freq: Two times a day (BID) | INTRAMUSCULAR | Status: DC
  Administered 2018-10-11 – 2018-10-12 (×2): 60 mg via INTRAVENOUS
  Filled 2018-10-11 (×3): qty 6

## 2018-10-11 NOTE — Progress Notes (Signed)
Physical Therapy Treatment Patient Details Name: Melinda [name] MRN: [id number] DOB: 07/07/1950 Today's Date: 10/11/2018    History of Present Illness 68 y.o. female, with history of asthma, pulmonary hypertension, chronic diastolic CHF came to ED with worsening shortness of breath    PT Comments    Pt continues to participate well. She did c/o some dyspnea and chest discomfort with ambulation on today. O2 sat levels: 92% on RA at rest, 88-89% on RA immediately after ambulation, 92% RA end of session (sitting in recliner). Plan is for possible d/c home on tomorrow.     Follow Up Recommendations  Home health PT     Equipment Recommendations  None recommended by PT    Recommendations for Other Services       Precautions / Restrictions Precautions Precautions: Fall Precaution Comments: monitor O2 Restrictions Weight Bearing Restrictions: No    Mobility  Bed Mobility Overal bed mobility: Modified Independent                Transfers Overall transfer level: Needs assistance Equipment used: None Transfers: Sit to/from Stand Sit to Stand: Min guard         General transfer comment: close guard for safety. unsteady.   Ambulation/Gait Ambulation/Gait assistance: Min guard Gait Distance (Feet): 60 Feet Assistive device: Rolling walker (2 wheeled) Gait Pattern/deviations: Step-through pattern     General Gait Details: unsteady but no LOB with RW use. O2 sat 88-89% on RA, dyspnea 2/4.    Stairs             Wheelchair Mobility    Modified Rankin (Stroke Patients Only)       Balance           Standing balance support: Bilateral upper extremity supported Standing balance-Leahy Scale: Fair                              Cognition Arousal/Alertness: Awake/alert Behavior During Therapy: WFL for tasks assessed/performed Overall Cognitive Status: Within Functional Limits for tasks assessed                                         Exercises      General Comments        Pertinent Vitals/Pain Pain Assessment: No/denies pain    Home Living                      Prior Function            PT Goals (current goals can now be found in the care plan section) Progress towards PT goals: Progressing toward goals    Frequency    Min 3X/week      PT Plan Current plan remains appropriate    Co-evaluation              AM-PAC PT "6 Clicks" Mobility   Outcome Measure  Help needed turning from your back to your side while in a flat bed without using bedrails?: None Help needed moving from lying on your back to sitting on the side of a flat bed without using bedrails?: None Help needed moving to and from a bed to a chair (including a wheelchair)?: A Little Help needed standing up from a chair using your arms (e.g., wheelchair or bedside chair)?: A Little Help needed to walk in hospital room?: A  Little Help needed climbing 3-5 steps with a railing? : A Little 6 Click Score: 20    End of Session Equipment Utilized During Treatment: Oxygen Activity Tolerance: Patient tolerated treatment well Patient left: in chair;with call bell/phone within reach   PT Visit Diagnosis: Difficulty in walking, not elsewhere classified (R26.2);Muscle weakness (generalized) (M62.81)     Time: 9147-8295 PT Time Calculation (min) (ACUTE ONLY): 26 min  Charges:  $Gait Training: 23-37 mins                       Rebeca Alert, PT Acute Rehabilitation Services Pager: [phone] Office: [phone]

## 2018-10-11 NOTE — Progress Notes (Signed)
Per Dr Randol Kern hold PLAQUENEL . QTC was 509

## 2018-10-11 NOTE — Plan of Care (Signed)
  Problem: Health Behavior/Discharge Planning: Goal: Ability to manage health-related needs will improve Outcome: Progressing   Problem: Clinical Measurements: Goal: Ability to maintain clinical measurements within normal limits will improve Outcome: Progressing Goal: Will remain free from infection Outcome: Progressing Goal: Diagnostic test results will improve Outcome: Progressing Goal: Respiratory complications will improve Outcome: Progressing Goal: Cardiovascular complication will be avoided Outcome: Progressing   Problem: Nutrition: Goal: Adequate nutrition will be maintained Outcome: Progressing   Problem: Coping: Goal: Level of anxiety will decrease Outcome: Progressing   Problem: Elimination: Goal: Will not experience complications related to urinary retention Outcome: Progressing   Problem: Skin Integrity: Goal: Risk for impaired skin integrity will decrease Outcome: Progressing

## 2018-10-11 NOTE — Progress Notes (Signed)
PROGRESS NOTE                                                                                                                                                                                                             Patient Demographics:    Melinda [name], is a 68 y.o. female, DOB - 29-Sep-1950, AOZ:[id number]  Admit date - 10/06/2018   Admitting Physician Meredeth Ide, MD  Outpatient Primary MD for the patient is [name], [name], NP  LOS - 5   Chief Complaint  Patient presents with  . Shortness of Breath       Brief Narrative    68 y.o. female, with history of asthma, pulmonary hypertension, chronic diastolic CHF came to ED with worsening shortness of breath.    Had an ED visit before 3 days, where she was hypoxic where she was discharged on home oxygen 2 L, she was back on 6 L upon this admission, work-up significant for volume overload, and pneumonia, admitted for further work-up.  She was COVID-19 positive   -O2 sats 80%.  In the ED  she is requiring 6 L/min of oxygen.    Subjective:    Melinda [name] today ports she is feeling better, dyspnea has improved, still reports some significant cough, much less productive, denies any fever or chills.   Assessment  & Plan :    Active Problems:   CAP (community acquired pneumonia)  Acute hypoxic respiratory failure due to COVID 19 -Multifactorial, secondary to volume overload and infectious process due to COVID-19 pneumonia, she was on 6 L on presentation, we are able to wean to 1 L nasal cannula this morning, she was encouraged to use incentive spirometry, as well to continue with awake prone .  Community-acquired pneumonia due to  COVID 19 -patient presenting with subjective fever, cough with yellow phlegm, chest x-ray showing infiltrate/pulmonary edema.  -He was treated with 5 days of IV Rocephin and azithromycin, will discontinue antibiotics today  -He was treated with total of 5 days of  Plaquenil as well, monitor potassium and magnesium closely  -Continue with vitamin C and zinc.  Acute on chronic diastolic CHF -2D echo 2018 with a preserved EF, grade 2 diastolic CHF, evidence of volume overload on imaging and physical exam, continue with IV diuresis, I will increase her Lasix to 60 mg  IV twice daily  Hypokalemia -repleted,  keep above 4 given he is on Plaquenil and Azithromycin  Hypertension -blood pressure stable, continue metoprolol 50 mg twice a day.  Chronic pain syndrome -patient is on oxycodone 15 mg every 4 hours as needed for pain.   Code Status : Full  Family Communication  : Discussed with son via phone 10/11/2018  Disposition Plan  : home  When stable, hopefully tomorrow if able to wean off oxygen  Consults  :  None  Procedures  : none  DVT Prophylaxis  :  [facility] lovenox  Lab Results  Component Value Date   PLT 428 (H) 10/11/2018    Antibiotics  :   Anti-infectives (From admission, onward)   Start     Dose/Rate Route Frequency Ordered Stop   10/07/18 2000  hydroxychloroquine (PLAQUENIL) tablet 200 mg     200 mg Oral 2 times daily 10/06/18 1424 10/11/18 1959   10/07/18 0800  azithromycin (ZITHROMAX) 500 mg in sodium chloride 0.9 % 250 mL IVPB  Status:  Discontinued     500 mg 250 mL/hr over 60 Minutes Intravenous Every 24 hours 10/06/18 0515 10/11/18 1633   10/07/18 0800  cefTRIAXone (ROCEPHIN) 2 g in sodium chloride 0.9 % 100 mL IVPB  Status:  Discontinued     2 g 200 mL/hr over 30 Minutes Intravenous Every 24 hours 10/06/18 0753 10/10/18 1314   10/07/18 0500  cefTRIAXone (ROCEPHIN) 1 g in sodium chloride 0.9 % 100 mL IVPB  Status:  Discontinued     1 g 200 mL/hr over 30 Minutes Intravenous Every 24 hours 10/06/18 0515 10/06/18 0753   10/06/18 1600  hydroxychloroquine (PLAQUENIL) tablet 400 mg     400 mg Oral 2 times daily 10/06/18 1424 10/07/18 0845   10/06/18 0415  cefTRIAXone (ROCEPHIN) 1 g in sodium chloride 0.9 % 100 mL IVPB     1 g  200 mL/hr over 30 Minutes Intravenous  Once 10/06/18 0412 10/06/18 0500   10/06/18 0415  azithromycin (ZITHROMAX) 500 mg in sodium chloride 0.9 % 250 mL IVPB     500 mg 250 mL/hr over 60 Minutes Intravenous  Once 10/06/18 0412 10/06/18 0625        Objective:   Vitals:   10/10/18 1228 10/10/18 1950 10/11/18 0557 10/11/18 1157  BP: 116/80 117/75 101/71 112/63  Pulse: 87 95 81 76  Resp: (!) 21 16 16 16   Temp: 98.2 F (36.8 C) 98.6 F (37 C) 97.9 F (36.6 C) 98 F (36.7 C)  TempSrc:  Oral Oral Oral  SpO2: 97% 97% 92% 97%  Weight:   100.2 kg   Height:        Wt Readings from Last 3 Encounters:  10/11/18 100.2 kg  09/03/18 109.8 kg  09/03/18 109.8 kg     Intake/Output Summary (Last 24 hours) at 10/11/2018 1638 Last data filed at 10/11/2018 0954 Gross per 24 hour  Intake 0 ml  Output 800 ml  Net -800 ml     Physical Exam  Awake Alert, Oriented X 3, No new F.N deficits, Normal affect Symmetrical Chest wall movement, Good air movement bilaterally, CTAB RRR,No Gallops,Rubs or new Murmurs, No Parasternal Heave +ve B.Sounds, Abd Soft, No tenderness, No rebound - guarding or rigidity. No Cyanosis, Clubbing or edema, No new Rash or bruise          Data Review:    CBC Recent Labs  Lab 10/07/18 0432 10/08/18 0335 10/09/18 0507 10/10/18 0402 10/11/18 0344  WBC 9.0 7.2 7.7 8.0 8.6  HGB 11.2* 11.1* 11.5* 11.9* 12.1  HCT 36.6 36.5 36.5 37.7 37.8  PLT 241 282 311 371 428*  MCV 88.0 89.0 87.3 88.5 88.9  MCH 26.9 27.1 27.5 27.9 28.5  MCHC 30.6 30.4 31.5 31.6 32.0  RDW 13.2 13.1 13.0 13.2 13.1  LYMPHSABS 1.4 1.5 1.6 1.9 1.8  MONOABS 2.3* 1.9* 1.9* 1.8* 1.9*  EOSABS 0.1 0.2 0.3 0.3 0.3  BASOSABS 0.0 0.1 0.1 0.1 0.1    Chemistries  Recent Labs  Lab 10/07/18 0432 10/08/18 0335 10/09/18 0507 10/10/18 0402 10/11/18 0344  NA 130* 136 138 138 135  K 3.8 3.7 3.8 4.0 3.6  CL 97* 101 101 98 96*  CO2 23 23 25 26 25   GLUCOSE 101* 80 73 89 90  BUN 11 9 9 14 16    CREATININE 0.81 0.70 0.72 0.72 0.87  CALCIUM 7.8* 8.2* 8.4* 9.1 8.9  MG  --  2.0  --  2.2 2.2  AST 22 20 19 21 19   ALT 11 10 13 14 14   ALKPHOS 72 70 72 78 71  BILITOT 0.8 0.6 0.6 0.5 0.7   ------------------------------------------------------------------------------------------------------------------ No results for input(s): CHOL, HDL, LDLCALC, TRIG, CHOLHDL, LDLDIRECT in the last 72 hours.  Lab Results  Component Value Date   HGBA1C 5.4 09/03/2018   ------------------------------------------------------------------------------------------------------------------ No results for input(s): TSH, T4TOTAL, T3FREE, THYROIDAB in the last 72 hours.  Invalid input(s): FREET3 ------------------------------------------------------------------------------------------------------------------ Recent Labs    10/10/18 0402  FERRITIN 248    Coagulation profile No results for input(s): INR, PROTIME in the last 168 hours.  No results for input(s): DDIMER in the last 72 hours.  Cardiac Enzymes Recent Labs  Lab 10/05/18 1201 10/06/18 0516  TROPONINI <0.03 0.05*   ------------------------------------------------------------------------------------------------------------------    Component Value Date/Time   BNP 34.6 10/06/2018 0319    Inpatient Medications  Scheduled Meds: . amitriptyline  100 mg Oral QHS  . aspirin EC  81 mg Oral Daily  . cholecalciferol  2,000 Units Oral Daily  . citalopram  20 mg Oral Daily  . enoxaparin (LOVENOX) injection  50 mg Subcutaneous Q24H  . famotidine  20 mg Oral QHS  . [START ON 10/12/2018] feeding supplement (ENSURE ENLIVE)  237 mL Oral BID BM  . furosemide  60 mg Intravenous BID  . gabapentin  1,200 mg Oral BID  . gabapentin  800 mg Oral QHS  . guaiFENesin  1,200 mg Oral BID  . hydroxychloroquine  200 mg Oral BID  . loratadine  10 mg Oral Daily  . metoprolol tartrate  50 mg Oral BID  . potassium chloride  40 mEq Oral BID  . vitamin C  500 mg  Oral BID  . zinc sulfate  220 mg Oral Daily   Continuous Infusions: . sodium chloride    . sodium chloride Stopped (10/08/18 1426)   PRN Meds:.sodium chloride, sodium chloride, albuterol, fluticasone, oxyCODONE  Micro Results Recent Results (from the past 240 hour(s))  Novel Coronavirus, NAA (hospital order; send-out to ref lab)     Status: Abnormal   Collection Time: 10/06/18  5:42 AM  Result Value Ref Range Status   SARS-CoV-2, NAA DETECTED (A) NOT DETECTED Final    Comment: Positive (Detected) results are indicative of active infection with SARS CoV 2. A positive result does not rule out bacterial infection or coinfection with other viruses. Detection of SARS CoV 2 viral RNA may not indicate that SARS CoV 2 is the causative agent for clinical symptoms.  Positive and negative predictive values of testing are highly dependent on prevalence. False positive test results are more likely when prevalence is moderate to low. RESULT CALLED TO, READ BACK BY AND VERIFIED WITH: L. CURRIN RN 10/08/2018 1648 BEAMJ (NOTE) The expected result is Negative (Not Detected). The SARS CoV 2 test is intended for the presumptive qualitative  detection of nucleic acid from SARS CoV 2 in upper and lower  respiratory specimens. Testing methodology is real time RT PCR. Test results must be correlated with clinical presentation and  evaluated in the context of other laboratory and epidemiologic data.  Test performance can be affected because the epidemiology and  clinical  spectrum of infection caused by SARS CoV 2 is not fully  known. For example, the optimum types of specimens to collect and  when during the course of infection these specimens are most likely  to contain detectable viral RNA may not be known. This test has not been Food and Drug Administration (FDA) cleared or  approved and has been authorized by FDA under an Emergency Use  Authorization (EUA). The test is only authorized for the duration  of  the declaration that circumstances exist justifying the authorization  of emergency use of in vitro diagnostic tests for detection and or  diagnosis of SARS CoV 2 under Section 564(b)(1) of the Act, 21 U.S.C.  section [phone] 3(b)(1), unless the authorization is terminated or  revoked sooner. [facility] is certified under the  Clinical Laboratory Improvement Amendments of 1988 (CLIA), 42 U.S.C.  section [phone], to perform high complexity tests. Performed at [facility] CLIA [id number] [address], [address], [place], [place] [address] Laboratory Director: Turner Daniels, MD Fact Sheet for Healthcare Providers  https://pope.com/ Fact Sheet for Patients  [email or web address] Performed at [facility], [address] [place], [place] [address]    Coronavirus Source NASOPHARYNGEAL  Final    Comment: Performed at [facility], [address] [place], [place] [address]  Respiratory Panel by PCR     Status: None   Collection Time: 10/06/18  5:42 AM  Result Value Ref Range Status   Adenovirus NOT DETECTED NOT DETECTED Final   Coronavirus 229E NOT DETECTED NOT DETECTED Final    Comment: (NOTE) The Coronavirus on the Respiratory Panel, DOES NOT test for the novel  Coronavirus (2019 nCoV)    Coronavirus HKU1 NOT DETECTED NOT DETECTED Final   Coronavirus NL63 NOT DETECTED NOT DETECTED Final   Coronavirus OC43 NOT DETECTED NOT DETECTED Final   Metapneumovirus NOT DETECTED NOT DETECTED Final   Rhinovirus / Enterovirus NOT DETECTED NOT DETECTED Final   Influenza A NOT DETECTED NOT DETECTED Final   Influenza B NOT DETECTED NOT DETECTED Final   Parainfluenza Virus 1 NOT DETECTED NOT DETECTED Final   Parainfluenza Virus 2 NOT DETECTED NOT DETECTED Final   Parainfluenza Virus 3 NOT DETECTED NOT DETECTED Final   Parainfluenza Virus 4 NOT DETECTED NOT DETECTED Final    Respiratory Syncytial Virus NOT DETECTED NOT DETECTED Final   Bordetella pertussis NOT DETECTED NOT DETECTED Final   Chlamydophila pneumoniae NOT DETECTED NOT DETECTED Final   Mycoplasma pneumoniae NOT DETECTED NOT DETECTED Final    Comment: Performed at [facility], [address] [place], [place] [address]  Culture, blood (Routine X 2) w Reflex to ID Panel     Status: None   Collection Time: 10/06/18  5:43 AM  Result Value Ref Range  Status   Specimen Description   Final    BLOOD LEFT ANTECUBITAL Performed at [facility], [address] [place], [place] [address]    Special Requests   Final    BOTTLES DRAWN AEROBIC AND ANAEROBIC Blood Culture results may not be optimal due to an excessive volume of blood received in culture bottles Performed at [facility], [address], [place], [place] [address]    Culture   Final    NO GROWTH 5 DAYS Performed at [facility], [address], [place], [place] [address]    Report Status 10/11/2018 FINAL  Final  Culture, blood (Routine X 2) w Reflex to ID Panel     Status: None   Collection Time: 10/06/18  5:44 AM  Result Value Ref Range Status   Specimen Description   Final    BLOOD BLOOD LEFT FOREARM Performed at [facility], [address], [place], [place] [address]    Special Requests   Final    BOTTLES DRAWN AEROBIC AND ANAEROBIC Blood Culture results may not be optimal due to an inadequate volume of blood received in culture bottles Performed at [facility], [address], [place], [place] [address]    Culture   Final    NO GROWTH 5 DAYS Performed at [facility], [address], [place], [place] [address]    Report Status 10/11/2018 FINAL  Final    Radiology Reports Dg Chest Port 1 View  Result Date: 10/06/2018 CLINICAL DATA:  Initial evaluation for acute shortness of breath, fever, chills. EXAM: PORTABLE CHEST 1 VIEW COMPARISON:   Prior radiograph from 10/05/2018. FINDINGS: Mild cardiomegaly, stable. Mediastinal silhouette within normal limits. Lungs hypoinflated with elevation left hemidiaphragm, similar to previous. Diffusely increased pulmonary vascular congestion with interstitial prominence, compatible with pulmonary interstitial edema. Superimposed linear atelectasis and/or scarring within the mid and lower lungs. There is increased confluent opacity at the left lung base, suspicious for infiltrate given history of fever. No pleural effusion. No pneumothorax. No acute osseous finding. IMPRESSION: 1. Cardiomegaly with mild diffuse pulmonary interstitial edema. 2. Superimposed confluent opacity at the left lung base, increased from previous, suspicious for possible infiltrate given provided history of fever. Electronically Signed   By: Rise Mu M.D.   On: 10/06/2018 03:41   Dg Chest Portable 1 View  Result Date: 10/05/2018 CLINICAL DATA:  9-10 days ago had body aches and a "Light fever" with a cough, hx of asthma, called her PCP and they told her she may have covid and to self quarantine, has been doing that for 8 days and has not gotten better, using albuterol inhaler without improvement. EXAM: PORTABLE CHEST 1 VIEW COMPARISON:  03/03/2018 FINDINGS: Cardiac silhouette is normal in size. No mediastinal or hilar masses. There is no evidence of adenopathy. Lungs show prominent bronchovascular markings. There is linear opacity in the mid lungs and lung bases consistent with atelectasis and/or scarring. A component of atelectasis is favored since there has been an increase since the prior radiographs. No lung consolidation. No convincing pleural effusion and no pneumothorax. Skeletal structures are grossly intact. IMPRESSION: 1. No convincing pneumonia. 2. Linear/reticular type opacities in the lung bases and mid lungs consistent with scarring and atelectasis. Chronic prominence of the bronchovascular markings bilaterally.  Electronically Signed   By: Amie Portland M.D.   On: 10/05/2018 12:23     Huey Bienenstock M.D on 10/11/2018 at 4:38 PM  Between 7am to  7pm - Pager - [phone]  After 7pm go to [email or web address] - password [facility]  [facility] -  Office  [phone]

## 2018-10-11 NOTE — Progress Notes (Addendum)
Pt is off O2 . Her sat's drop as low as 81 when she is eating, drinking and or talking. When she takes deep breaths her sat's will com up to 97-100.She continues to her her IS. She stated that she tries to reach 1750 .but is not always successful. Her sputum is a light tan color. Thin and small in amount.

## 2018-10-11 NOTE — Care Management Important Message (Signed)
Important Message  Patient Details IM Letter given to Case Manager to present to the Patient Name: Melinda [name] MRN: [id number] Date of Birth: 1951-05-15   Medicare Important Message Given:  Yes    Caren Macadam 10/11/2018, 1:22 PMImportant Message  Patient Details  Name: Melinda [name] MRN: [id number] Date of Birth: Nov 11, 1950   Medicare Important Message Given:  Yes    Caren Macadam 10/11/2018, 1:22 PM

## 2018-10-12 LAB — BASIC METABOLIC PANEL
Anion gap: 13 (ref 5–15)
BUN: 13 mg/dL (ref 8–23)
CO2: 24 mmol/L (ref 22–32)
Calcium: 9 mg/dL (ref 8.9–10.3)
Chloride: 99 mmol/L (ref 98–111)
Creatinine, Ser: 0.89 mg/dL (ref 0.44–1.00)
GFR calc Af Amer: >60 mL/min (ref >60)
GFR calc non Af Amer: >60 mL/min (ref >60)
Glucose, Bld: 87 mg/dL (ref 70–99)
Potassium: 4.1 mmol/L (ref 3.5–5.1)
Sodium: 136 mmol/L (ref 135–145)

## 2018-10-12 LAB — MAGNESIUM: Magnesium: 2.1 mg/dL (ref 1.7–2.4)

## 2018-10-12 MED ORDER — GUAIFENESIN ER 600 MG PO TB12: 1200.0000 mg | ORAL_TABLET | Freq: Two times a day (BID) | ORAL | 0 refills | Status: DC

## 2018-10-12 MED ORDER — FUROSEMIDE 40 MG PO TABS: 40.0000 mg | ORAL_TABLET | Freq: Every day | ORAL | 0 refills | Status: DC

## 2018-10-12 MED ORDER — POTASSIUM CHLORIDE CRYS ER 20 MEQ PO TBCR
EXTENDED_RELEASE_TABLET | ORAL | Status: AC
  Administered 2018-10-12: 40 meq via ORAL
  Filled 2018-10-12: qty 1

## 2018-10-12 MED ORDER — ASCORBIC ACID 500 MG PO TABS: 500.0000 mg | ORAL_TABLET | Freq: Two times a day (BID) | ORAL | Status: DC

## 2018-10-12 MED ORDER — ZINC SULFATE 220 (50 ZN) MG PO CAPS: 220.0000 mg | ORAL_CAPSULE | Freq: Every day | ORAL | Status: DC

## 2018-10-12 NOTE — Discharge Instructions (Signed)
Person Under Monitoring Name: Melinda [name]  Location: [address] [place] [address]   Infection Prevention Recommendations for Individuals Confirmed to have, or Being Evaluated for, 2019 Novel Coronavirus (COVID-19) Infection Who Receive Care at Home  Individuals who are confirmed to have, or are being evaluated for, COVID-19 should follow the prevention steps below until a healthcare provider or local or state health department says they can return to normal activities.  Stay home except to get medical care You should restrict activities outside your home, except for getting medical care. Do not go to work, school, or public areas, and do not use public transportation or taxis.  Call ahead before visiting your doctor Before your medical appointment, call the healthcare provider and tell them that you have, or are being evaluated for, COVID-19 infection. This will help the healthcare providers office take steps to keep other people from getting infected. Ask your healthcare provider to call the local or state health department.  Monitor your symptoms Seek prompt medical attention if your illness is worsening (e.g., difficulty breathing). Before going to your medical appointment, call the healthcare provider and tell them that you have, or are being evaluated for, COVID-19 infection. Ask your healthcare provider to call the local or state health department.  Wear a facemask You should wear a facemask that covers your nose and mouth when you are in the same room with other people and when you visit a healthcare provider. People who live with or visit you should also wear a facemask while they are in the same room with you.  Separate yourself from other people in your home As much as possible, you should stay in a different room from other people in your home. Also, you should use a separate bathroom, if available.  Avoid sharing household items You  should not share dishes, drinking glasses, cups, eating utensils, towels, bedding, or other items with other people in your home. After using these items, you should wash them thoroughly with soap and water.  Cover your coughs and sneezes Cover your mouth and nose with a tissue when you cough or sneeze, or you can cough or sneeze into your sleeve. Throw used tissues in a lined trash can, and immediately wash your hands with soap and water for at least 20 seconds or use an alcohol-based hand rub.  Wash your [employer] your hands often and thoroughly with soap and water for at least 20 seconds. You can use an alcohol-based hand sanitizer if soap and water are not available and if your hands are not visibly dirty. Avoid touching your eyes, nose, and mouth with unwashed hands.   Prevention Steps for Caregivers and Household Members of Individuals Confirmed to have, or Being Evaluated for, COVID-19 Infection Being Cared for in the Home  If you live with, or provide care at home for, a person confirmed to have, or being evaluated for, COVID-19 infection please follow these guidelines to prevent infection:  Follow healthcare providers instructions Make sure that you understand and can help the patient follow any healthcare provider instructions for all care.  Provide for the patients basic needs You should help the patient with basic needs in the home and provide support for getting groceries, prescriptions, and other personal needs.  Monitor the patients symptoms If they are getting sicker, call his or her medical provider and tell them that the patient has, or is being evaluated for, COVID-19 infection. This will help the healthcare  providers office take steps to keep other people from getting infected. Ask the healthcare provider to call the local or state health department.  Limit the number of people who have contact with the patient  If possible, have only one caregiver for the  patient.  Other household members should stay in another home or place of residence. If this is not possible, they should stay  in another room, or be separated from the patient as much as possible. Use a separate bathroom, if available.  Restrict visitors who do not have an essential need to be in the home.  Keep older adults, very young children, and other sick people away from the patient Keep older adults, very young children, and those who have compromised immune systems or chronic health conditions away from the patient. This includes people with chronic heart, lung, or kidney conditions, diabetes, and cancer.  Ensure good ventilation Make sure that shared spaces in the home have good air flow, such as from an air conditioner or an opened window, weather permitting.  Wash your hands often  Wash your hands often and thoroughly with soap and water for at least 20 seconds. You can use an alcohol based hand sanitizer if soap and water are not available and if your hands are not visibly dirty.  Avoid touching your eyes, nose, and mouth with unwashed hands.  Use disposable paper towels to dry your hands. If not available, use dedicated cloth towels and replace them when they become wet.  Wear a facemask and gloves  Wear a disposable facemask at all times in the room and gloves when you touch or have contact with the patients blood, body fluids, and/or secretions or excretions, such as sweat, saliva, sputum, nasal mucus, vomit, urine, or feces.  Ensure the mask fits over your nose and mouth tightly, and do not touch it during use.  Throw out disposable facemasks and gloves after using them. Do not reuse.  Wash your hands immediately after removing your facemask and gloves.  If your personal clothing becomes contaminated, carefully remove clothing and launder. Wash your hands after handling contaminated clothing.  Place all used disposable facemasks, gloves, and other waste in a lined  container before disposing them with other household waste.  Remove gloves and wash your hands immediately after handling these items.  Do not share dishes, glasses, or other household items with the patient  Avoid sharing household items. You should not share dishes, drinking glasses, cups, eating utensils, towels, bedding, or other items with a patient who is confirmed to have, or being evaluated for, COVID-19 infection.  After the person uses these items, you should wash them thoroughly with soap and water.  Wash laundry thoroughly  Immediately remove and wash clothes or bedding that have blood, body fluids, and/or secretions or excretions, such as sweat, saliva, sputum, nasal mucus, vomit, urine, or feces, on them.  Wear gloves when handling laundry from the patient.  Read and follow directions on labels of laundry or clothing items and detergent. In general, wash and dry with the warmest temperatures recommended on the label.  Clean all areas the individual has used often  Clean all touchable surfaces, such as counters, tabletops, doorknobs, bathroom fixtures, toilets, phones, keyboards, tablets, and bedside tables, every day. Also, clean any surfaces that may have blood, body fluids, and/or secretions or excretions on them.  Wear gloves when cleaning surfaces the patient has come in contact with.  Use a diluted bleach solution (e.g., dilute bleach with  1 part bleach and 10 parts water) or a household disinfectant with a label that says EPA-registered for coronaviruses. To make a bleach solution at home, add 1 tablespoon of bleach to 1 quart (4 cups) of water. For a larger supply, add  cup of bleach to 1 gallon (16 cups) of water.  Read labels of cleaning products and follow recommendations provided on product labels. Labels contain instructions for safe and effective use of the cleaning product including precautions you should take when applying the product, such as wearing gloves or  eye protection and making sure you have good ventilation during use of the product.  Remove gloves and wash hands immediately after cleaning.  Monitor yourself for signs and symptoms of illness Caregivers and household members are considered close contacts, should monitor their health, and will be asked to limit movement outside of the home to the extent possible. Follow the monitoring steps for close contacts listed on the symptom monitoring form.   ? If you have additional questions, contact your local health department or call the epidemiologist on call at [phone] (available 24/7). ? This guidance is subject to change. For the most up-to-date guidance from [facility], please refer to their website: [email or web address]

## 2018-10-12 NOTE — Progress Notes (Signed)
AVS given to patient and explained at the bedside. Medications and follow up appointments have been explained with pt verbalizing understanding. Pt agrees to self quarantine for fourteen days due to COVID-19 positive result.

## 2018-10-12 NOTE — TOC Initial Note (Signed)
Transition of Care [facility]) - Initial/Assessment Note    Patient Details  Name: Melinda [name] MRN: [id number] Date of Birth: Jan 08, 1951  [facility]) CM/SW Contact:    Geni Bers, RN Phone Number: 10/12/2018, 12:41 PM  Clinical Narrative:                   Expected Discharge Plan: Home w Home Health Services Barriers to Discharge: No Barriers Identified   Patient Goals and CMS Choice Patient states their goals for this hospitalization and ongoing recovery are:: to get better   Choice offered to / list presented to : Patient  Expected Discharge Plan and Services Expected Discharge Plan: Home w Home Health Services   Discharge Planning Services: CM Consult   Living arrangements for the past 2 months: Apartment Expected Discharge Date: 10/12/18                   HH Arranged: RN, PT HH Agency: [facility]  Prior Living Arrangements/Services Living arrangements for the past 2 months: Apartment Lives with:: Self Patient language and need for interpreter reviewed:: No Do you feel safe going back to the place where you live?: Yes               Activities of Daily Living Home Assistive Devices/Equipment: Eyeglasses, Environmental consultant (specify type)(rollator) ADL Screening (condition at time of admission) Patient's cognitive ability adequate to safely complete daily activities?: Yes Is the patient deaf or have difficulty hearing?: No Does the patient have difficulty seeing, even when wearing glasses/contacts?: No Does the patient have difficulty concentrating, remembering, or making decisions?: No Patient able to express need for assistance with ADLs?: Yes Does the patient have difficulty dressing or bathing?: No Independently performs ADLs?: Yes (appropriate for developmental age) Does the patient have difficulty walking or climbing stairs?: No Weakness of Legs: Both Weakness of Arms/Hands: Both  Permission Sought/Granted Permission sought to  share information with : Case Manager                Emotional Assessment Appearance:: Appears stated age   Affect (typically observed): Accepting Orientation: : Oriented to Self, Oriented to Place, Oriented to  Time, Oriented to Situation      Admission diagnosis:  Respiratory alkalosis [E87.3] SOB (shortness of breath) [R06.02] Hypoxia [R09.02] CAP (community acquired pneumonia) [J18.9] Patient Active Problem List   Diagnosis Date Noted  . COVID-19 virus infection 10/12/2018  . CAP (community acquired pneumonia) 10/06/2018  . Polyneuropathy in diseases classified elsewhere (HCC) 10/04/2018  . Prediabetes 09/12/2017  . High risk medication use 09/12/2017  . Frequent falls 09/12/2017  . Chronic pain syndrome 08/14/2017  . Gastroesophageal reflux disease 08/14/2017  . Cough variant asthma 03/14/2017  . Pulmonary hypertension (HCC) 03/12/2017  . Chest pain 11/11/2016  . Asthma 11/11/2016  . Atypical chest pain   . Costochondritis   . Chronic congestive heart failure (HCC)   . MDD (major depressive disorder), recurrent episode (HCC) 05/23/2015  . Allergic rhinitis 11/03/2014  . Paresthesia 09/08/2014  . Peripheral neuropathy 07/14/2014  . Morbid obesity due to excess calories (HCC) 07/14/2014  . Pain in limb 07/13/2014  . Abnormality of gait 07/13/2014  . Fibromyalgia 06/13/2014  . Spinal stenosis 01/27/2013  . HTN (hypertension) 11/30/2012   PCP:  Caesar Bookman, NP Pharmacy:   [facility] [phone] - [place], [place] - [address] AT [facility] OF [address] [place] [address] Phone: [phone] Fax: [phone]  Social Determinants of Health (SDOH) Interventions    Readmission Risk Interventions No flowsheet data found.

## 2018-10-12 NOTE — Progress Notes (Signed)
[facility] called for transportation home 

## 2018-10-12 NOTE — Discharge Summary (Signed)
Concha Leo, is a 68 y.o. female  DOB [birth year]  MRN [id number].  Admission date:  10/06/2018  Admitting Physician  Meredeth Ide, MD  Discharge Date:  10/12/2018   Primary MD  [name], [name], NP  Recommendations for primary care physician for things to follow:  - please check CBC,CMP, during next visit. -Patient was given [facility] Department of Health infection prevention recommendation, and instructed to follow   Admission Diagnosis  Respiratory alkalosis [E87.3] SOB (shortness of breath) [R06.02] Hypoxia [R09.02] CAP (community acquired pneumonia) [J18.9]   Discharge Diagnosis  Respiratory alkalosis [E87.3] SOB (shortness of breath) [R06.02] Hypoxia [R09.02] CAP (community acquired pneumonia) [J18.9]    Active Problems:   CAP (community acquired pneumonia)   COVID-19 virus infection      Past Medical History:  Diagnosis Date   Allergy    Anxiety    Arthritis    "99% of my body" (11/11/2016)   Asthma    Bursitis of both hips    Cataract    Cervical scoliosis    Chronic back pain    "all over my back" (11/11/2016)   Depressive disorder, not elsewhere classified    Enthesopathy of hip region    Environmental allergies    "I take Claritin qd; 365 days/year" (11/11/2016)   Essential hypertension, benign    Fibromyalgia    Frequent falls    GERD (gastroesophageal reflux disease)    Headache    "at least 1/wk; may last for 2 days or so" (11/11/2016)   History of blood transfusion 1985   w/hysterectomy   Lumbar stenosis    Migraine    "a few/month" (11/11/2016)   Muscle weakness (generalized)    Myalgia and myositis, unspecified    Osteoporosis    Other abnormal blood chemistry    Other malaise and fatigue    Raynaud's syndrome    Sciatic nerve pain, left    Sciatica    Unspecified vitamin D deficiency     Past Surgical History:  Procedure  Laterality Date   ABDOMINAL HYSTERECTOMY  1985   CESAREAN SECTION  1976; 1979   SHOULDER OPEN ROTATOR CUFF REPAIR Left        History of present illness and  Hospital Course:     Kindly see H&P for history of present illness and admission details, please review complete Labs, Consult reports and Test reports for all details in brief  HPI  from the history and physical done on the day of admission 10/06/2018   Addiline Settlemyre  is a 68 y.o. female, with history of asthma, pulmonary hypertension, chronic diastolic CHF came to ED with worsening shortness of breath.  Patient was seen in the [facility], ED yesterday for flulike illness and was noted to have new oxygen requirement.  Admission was considered at that time but patient was able to get home oxygen and she was only requiring 2 L of oxygen at rest and 4 L of oxygen during ambulation.  So she was sent home.  Patient  says that she woke up in the middle of night with shortness of breath and noted that her oxygen tank had been depleted.  She was noted to be hypoxic on room air by EMS with O2 sats 80%.  In the ED today she is requiring 6 L/min of oxygen.  Patient says that she has symptoms of myalgia with malaise, subjective fever cough with yellow-green phlegm for past 10 days.  It has become worse as per patient.  Chest x-ray shows pulmonary edema/infiltrate She denies chest pain at this time. Denies nausea vomiting or diarrhea. Denies abdominal pain or dysuria. Concern for COVID-19 infection.   Hospital Course   68 y.o.female,with history of asthma, pulmonary hypertension, chronic diastolic CHF came to ED with worsening shortness of breath.   Had an ED visit before 3 days, where she was hypoxic where she was discharged on home oxygen 2 L, she was back on 6 L upon this admission, work-up significant for volume overload, and pneumonia, admitted for further work-up.  She was COVID-19 positive -O2 sats 80%. In the ED  she is requiring  6 L/min of oxygen.    Acute hypoxic respiratory failure due to COVID 19 -Multifactorial, secondary to volume overload and infectious process due to COVID-19 pneumonia, she was on 6 L on presentation,  she is weaned off oxygen, she is a room air on discharge, encouraged to take her flutter valve on a sprinkler spirometry home and continue using at home .  Marland Kitchen  Community-acquired pneumonia due to  COVID 19 -patient presenting with subjective fever, cough with yellow phlegm, chest x-ray showing infiltrate/pulmonary edema. -He was treated with 5 days of IV Rocephin and azithromycin, will discontinue antibiotics today  -He was treated with total of 5 days of Plaquenil as well, monitor potassium and magnesium closely  -Continue with vitamin C and zinc for another 10 days  Acute on chronic diastolic CHF -2D echo 2018 with a preserved EF, grade 2 diastolic CHF, evidence of volume overload on imaging and physical exam, he was treated with IV diuresis during hospital stay, she will be started on Lasix 40 mg oral daily on discharge  Hypokalemia -repleted,    Hypertension -blood pressure stable, continue metoprolol 50 mg twice a day.  Chronic pain syndrome -patient is on oxycodone 15 mg every 4 hours as needed for pain.   Discharge Condition:  stable   Follow UP  Follow-up Information    [name], [name] C, NP Follow up.   Specialty:  Family Medicine Contact information: [address] [place] [address] [phone]             Discharge Instructions  and  Discharge Medications     Discharge Instructions    Discharge instructions   Complete by:  As directed    Follow with Primary MD [name], [name], NP   Get CBC, CMP, 2 view Chest X ray checked  by Primary MD next visit.    Activity: As tolerated with Full fall precautions use walker/cane & assistance as needed   Disposition Home    Diet: Heart Healthy , low salt , with feeding assistance and aspiration  precautions.  For Heart failure patients - Check your Weight same time everyday, if you gain over 2 pounds, or you develop in leg swelling, experience more shortness of breath or chest pain, call your Primary MD immediately. Follow Cardiac Low Salt Diet and 1.5 lit/day fluid restriction.   On your next visit with your primary care physician  please Get Medicines reviewed and adjusted.   Please request your Prim.MD to go over all Hospital Tests and Procedure/Radiological results at the follow up, please get all Hospital records sent to your Prim MD by signing hospital release before you go home.   If you experience worsening of your admission symptoms, develop shortness of breath, life threatening emergency, suicidal or homicidal thoughts you must seek medical attention immediately by calling 911 or calling your MD immediately  if symptoms less severe.  You Must read complete instructions/literature along with all the possible adverse reactions/side effects for all the Medicines you take and that have been prescribed to you. Take any new Medicines after you have completely understood and accpet all the possible adverse reactions/side effects.   Do not drive, operating heavy machinery, perform activities at heights, swimming or participation in water activities or provide baby sitting services if your were admitted for syncope or siezures until you have seen by Primary MD or a Neurologist and advised to do so again.  Do not drive when taking Pain medications.    Do not take more than prescribed Pain, Sleep and Anxiety Medications  Special Instructions: If you have smoked or chewed Tobacco  in the last 2 yrs please stop smoking, stop any regular Alcohol  and or any Recreational drug use.  Wear Seat belts while driving.   Please note  You were cared for by a hospitalist during your hospital stay. If you have any questions about your discharge medications or the care you received while you  were in the hospital after you are discharged, you can call the unit and asked to speak with the hospitalist on call if the hospitalist that took care of you is not available. Once you are discharged, your primary care physician will handle any further medical issues. Please note that NO REFILLS for any discharge medications will be authorized once you are discharged, as it is imperative that you return to your primary care physician (or establish a relationship with a primary care physician if you do not have one) for your aftercare needs so that they can reassess your need for medications and monitor your lab values.   Increase activity slowly   Complete by:  As directed      Allergies as of 10/12/2018   No Known Allergies     Medication List    TAKE these medications   acetaminophen 500 MG tablet Commonly known as:  TYLENOL Take 500 mg by mouth every 6 (six) hours as needed for moderate pain or fever.   albuterol 108 (90 Base) MCG/ACT inhaler Commonly known as:  Ventolin HFA INHALE 2 PUFFS  EVERY 6 (SIX) HOURS AS NEEDED FOR WHEEZING OR SHORTNESS OF BREATH. What changed:    how much to take  how to take this  when to take this  reasons to take this  additional instructions   amitriptyline 100 MG tablet Commonly known as:  ELAVIL Take 1 tablet (100 mg total) by mouth at bedtime. For mood   antiseptic oral rinse Liqd 15 mLs by Mouth Rinse route as needed for dry mouth.   ascorbic acid 500 MG tablet Commonly known as:  VITAMIN C Take 1 tablet (500 mg total) by mouth 2 (two) times daily.   aspirin EC 81 MG tablet Take 81 mg by mouth daily.   carboxymethylcellulose 0.5 % Soln Commonly known as:  REFRESH PLUS Place 2 drops into both eyes 2 (two) times daily as needed (dry eyes).  citalopram 20 MG tablet Commonly known as:  CELEXA Take 1 tablet (20 mg total) by mouth daily.   famotidine 20 MG tablet Commonly known as:  PEPCID Take 1 tablet (20 mg total) by mouth at  bedtime.   fluticasone 50 MCG/ACT nasal spray Commonly known as:  FLONASE Place 2 sprays into both nostrils daily as needed for allergies or rhinitis.   furosemide 40 MG tablet Commonly known as:  LASIX Take 1 tablet (40 mg total) by mouth daily.   gabapentin 400 MG capsule Commonly known as:  NEURONTIN TAKE 3 CAPSULES TWICE DAILY  AND TAKE 2 CAPSULES AT BEDTIME  FOR  NEUROPATHY What changed:    how much to take  how to take this  when to take this  additional instructions   guaiFENesin 600 MG 12 hr tablet Commonly known as:  MUCINEX Take 2 tablets (1,200 mg total) by mouth 2 (two) times daily.   loratadine 10 MG tablet Commonly known as:  CLARITIN Take 10 mg by mouth daily.   methocarbamol 500 MG tablet Commonly known as:  ROBAXIN Take 1 tablet (500 mg total) by mouth 2 (two) times daily. For muscle spasm   metoprolol tartrate 50 MG tablet Commonly known as:  LOPRESSOR TAKE ONE TABLET TWICE DAILY FOR BLOOD PRESSURE What changed:    how much to take  how to take this  when to take this   oxyCODONE 15 MG immediate release tablet Commonly known as:  Roxicodone Take 1 tablet (15 mg total) by mouth every 4 (four) hours as needed for pain.   Vitamin D3 50 MCG (2000 UT) Tabs Take 2,000 Units by mouth daily.   zinc sulfate 220 (50 Zn) MG capsule Take 1 capsule (220 mg total) by mouth daily. Start taking on:  October 13, 2018         Diet and Activity recommendation: See Discharge Instructions above   Consults obtained -  None   Major procedures and Radiology Reports - PLEASE review detailed and final reports for all details, in brief -     Dg Chest Port 1 View  Result Date: 10/06/2018 CLINICAL DATA:  Initial evaluation for acute shortness of breath, fever, chills. EXAM: PORTABLE CHEST 1 VIEW COMPARISON:  Prior radiograph from 10/05/2018. FINDINGS: Mild cardiomegaly, stable. Mediastinal silhouette within normal limits. Lungs hypoinflated with elevation  left hemidiaphragm, similar to previous. Diffusely increased pulmonary vascular congestion with interstitial prominence, compatible with pulmonary interstitial edema. Superimposed linear atelectasis and/or scarring within the mid and lower lungs. There is increased confluent opacity at the left lung base, suspicious for infiltrate given history of fever. No pleural effusion. No pneumothorax. No acute osseous finding. IMPRESSION: 1. Cardiomegaly with mild diffuse pulmonary interstitial edema. 2. Superimposed confluent opacity at the left lung base, increased from previous, suspicious for possible infiltrate given provided history of fever. Electronically Signed   By: Rise Mu M.D.   On: 10/06/2018 03:41   Dg Chest Portable 1 View  Result Date: 10/05/2018 CLINICAL DATA:  9-10 days ago had body aches and a "Light fever" with a cough, hx of asthma, called her PCP and they told her she may have covid and to self quarantine, has been doing that for 8 days and has not gotten better, using albuterol inhaler without improvement. EXAM: PORTABLE CHEST 1 VIEW COMPARISON:  03/03/2018 FINDINGS: Cardiac silhouette is normal in size. No mediastinal or hilar masses. There is no evidence of adenopathy. Lungs show prominent bronchovascular markings. There is linear opacity in  the mid lungs and lung bases consistent with atelectasis and/or scarring. A component of atelectasis is favored since there has been an increase since the prior radiographs. No lung consolidation. No convincing pleural effusion and no pneumothorax. Skeletal structures are grossly intact. IMPRESSION: 1. No convincing pneumonia. 2. Linear/reticular type opacities in the lung bases and mid lungs consistent with scarring and atelectasis. Chronic prominence of the bronchovascular markings bilaterally. Electronically Signed   By: Amie Portland M.D.   On: 10/05/2018 12:23    Micro Results    Recent Results (from the past 240 hour(s))  Novel  Coronavirus, NAA (hospital order; send-out to ref lab)     Status: Abnormal   Collection Time: 10/06/18  5:42 AM  Result Value Ref Range Status   SARS-CoV-2, NAA DETECTED (A) NOT DETECTED Final    Comment: Positive (Detected) results are indicative of active infection with SARS CoV 2. A positive result does not rule out bacterial infection or coinfection with other viruses. Detection of SARS CoV 2 viral RNA may not indicate that SARS CoV 2 is the causative agent for clinical symptoms. Positive and negative predictive values of testing are highly dependent on prevalence. False positive test results are more likely when prevalence is moderate to low. RESULT CALLED TO, READ BACK BY AND VERIFIED WITH: L. CURRIN RN 10/08/2018 1648 BEAMJ (NOTE) The expected result is Negative (Not Detected). The SARS CoV 2 test is intended for the presumptive qualitative  detection of nucleic acid from SARS CoV 2 in upper and lower  respiratory specimens. Testing methodology is real time RT PCR. Test results must be correlated with clinical presentation and  evaluated in the context of other laboratory and epidemiologic data.  Test performance can be affected because the epidemiology and  clinical  spectrum of infection caused by SARS CoV 2 is not fully  known. For example, the optimum types of specimens to collect and  when during the course of infection these specimens are most likely  to contain detectable viral RNA may not be known. This test has not been Food and Drug Administration (FDA) cleared or  approved and has been authorized by FDA under an Emergency Use  Authorization (EUA). The test is only authorized for the duration of  the declaration that circumstances exist justifying the authorization  of emergency use of in vitro diagnostic tests for detection and or  diagnosis of SARS CoV 2 under Section 564(b)(1) of the Act, 21 U.S.C.  section [phone] 3(b)(1), unless the authorization is terminated or    revoked sooner. [facility] is certified under the  Clinical Laboratory Improvement Amendments of 1988 (CLIA), 42 U.S.C.  section [phone], to perform high complexity tests. Performed at [facility] CLIA [id number] [address], [place], [place] [address] Laboratory Director: Turner Daniels, MD Fact Sheet for Healthcare Providers  https://pope.com/ Fact Sheet for Patients  [email or web address] Performed at [facility], [address] [place], [place] [address]    Coronavirus Source NASOPHARYNGEAL  Final    Comment: Performed at [facility], [address], [place], [place] [address]  Respiratory Panel by PCR     Status: None   Collection Time: 10/06/18  5:42 AM  Result Value Ref Range Status   Adenovirus NOT DETECTED NOT DETECTED Final   Coronavirus 229E NOT DETECTED NOT DETECTED Final    Comment: (NOTE) The Coronavirus on the Respiratory Panel, DOES NOT test for the novel  Coronavirus (  2019 nCoV)    Coronavirus HKU1 NOT DETECTED NOT DETECTED Final   Coronavirus NL63 NOT DETECTED NOT DETECTED Final   Coronavirus OC43 NOT DETECTED NOT DETECTED Final   Metapneumovirus NOT DETECTED NOT DETECTED Final   Rhinovirus / Enterovirus NOT DETECTED NOT DETECTED Final   Influenza A NOT DETECTED NOT DETECTED Final   Influenza B NOT DETECTED NOT DETECTED Final   Parainfluenza Virus 1 NOT DETECTED NOT DETECTED Final   Parainfluenza Virus 2 NOT DETECTED NOT DETECTED Final   Parainfluenza Virus 3 NOT DETECTED NOT DETECTED Final   Parainfluenza Virus 4 NOT DETECTED NOT DETECTED Final   Respiratory Syncytial Virus NOT DETECTED NOT DETECTED Final   Bordetella pertussis NOT DETECTED NOT DETECTED Final   Chlamydophila pneumoniae NOT DETECTED NOT DETECTED Final   Mycoplasma pneumoniae NOT DETECTED NOT DETECTED Final    Comment: Performed at [facility], [address] [place], [place] [address]  Culture, blood (Routine X 2) w Reflex to ID Panel     Status: None   Collection Time: 10/06/18  5:43 AM  Result Value Ref Range Status   Specimen Description   Final    BLOOD LEFT ANTECUBITAL Performed at [facility], [address] [place], [place] [address]    Special Requests   Final    BOTTLES DRAWN AEROBIC AND ANAEROBIC Blood Culture results may not be optimal due to an excessive volume of blood received in culture bottles Performed at [facility], [address] [place], [place] [address]    Culture   Final    NO GROWTH 5 DAYS Performed at [facility], [address], [place], [place] [address]    Report Status 10/11/2018 FINAL  Final  Culture, blood (Routine X 2) w Reflex to ID Panel     Status: None   Collection Time: 10/06/18  5:44 AM  Result Value Ref Range Status   Specimen Description   Final    BLOOD BLOOD LEFT FOREARM Performed at [facility], [address] [place], [place] [address]    Special Requests   Final    BOTTLES DRAWN AEROBIC AND ANAEROBIC Blood Culture results may not be optimal due to an inadequate volume of blood received in culture bottles Performed at [facility], [address] [place], [place] [address]    Culture   Final    NO GROWTH 5 DAYS Performed at [facility], [address] [place], [place] [address]    Report Status 10/11/2018 FINAL  Final       Today   Subjective:   Skila Angeletti today has no headache,no chest abdominal pain,no new weakness tingling or numbness, feels much better wants to go home today.   Objective:   Blood pressure 121/73, pulse (!) 105, temperature (!) 97.3 F (36.3 C), temperature source Oral, resp. rate 18, height 5\' 4"  (1.626 m), weight 100.2 kg, SpO2 96 %.   Intake/Output Summary (Last 24 hours) at 10/12/2018 1054 Last data filed at 10/12/2018 0614 Gross per 24 hour  Intake  240 ml  Output 350 ml  Net -110 ml    Exam Awake Alert, Oriented x 3, No new F.N deficits, Normal affect Symmetrical Chest wall movement, Good air movement bilaterally, CTAB RRR,No Gallops,Rubs or new Murmurs, No Parasternal Heave +ve B.Sounds, Abd Soft, Non tender, No rebound -guarding or rigidity. No Cyanosis, Clubbing or edema, No new Rash or bruise  Data Review  CBC w Diff:  Lab Results  Component Value Date   WBC 8.6 10/11/2018   HGB 12.1 10/11/2018   HGB 12.2 05/23/2015   HCT 37.8 10/11/2018   HCT 37.4 05/23/2015   PLT 428 (H) 10/11/2018   PLT 237 05/23/2015   LYMPHOPCT 21 10/11/2018   MONOPCT 22 10/11/2018   EOSPCT 3 10/11/2018   BASOPCT 1 10/11/2018    CMP:  Lab Results  Component Value Date   NA 136 10/12/2018   NA 140 05/23/2015   K 4.1 10/12/2018   CL 99 10/12/2018   CO2 24 10/12/2018   BUN 13 10/12/2018   BUN 10 05/23/2015   CREATININE 0.89 10/12/2018   CREATININE 0.99 09/03/2018   PROT 7.5 10/11/2018   PROT 7.0 10/26/2015   ALBUMIN 3.2 (L) 10/11/2018   ALBUMIN 4.3 10/26/2015   BILITOT 0.7 10/11/2018   BILITOT 0.6 10/26/2015   ALKPHOS 71 10/11/2018   AST 19 10/11/2018   ALT 14 10/11/2018  .   Total Time in preparing paper work, data evaluation and todays exam - 35 minutes  Huey Bienenstock M.D on 10/12/2018 at 10:54 AM  [facility]   Office  [phone]

## 2018-10-13 ENCOUNTER — Telehealth: Payer: Self-pay | Admitting: *Deleted

## 2018-10-13 NOTE — Telephone Encounter (Signed)
I have made the 1st attempt to contact the patient or family member in charge, in order to follow up from recently being discharged from the hospital. I left a message on voicemail but I will make another attempt at a different time.  

## 2018-10-14 DIAGNOSIS — I7300 Raynaud's syndrome without gangrene: Secondary | ICD-10-CM | POA: Diagnosis not present

## 2018-10-14 DIAGNOSIS — J45909 Unspecified asthma, uncomplicated: Secondary | ICD-10-CM

## 2018-10-14 DIAGNOSIS — I110 Hypertensive heart disease with heart failure: Secondary | ICD-10-CM

## 2018-10-14 DIAGNOSIS — J1289 Other viral pneumonia: Secondary | ICD-10-CM | POA: Diagnosis not present

## 2018-10-14 DIAGNOSIS — J9601 Acute respiratory failure with hypoxia: Secondary | ICD-10-CM | POA: Diagnosis not present

## 2018-10-14 DIAGNOSIS — I5033 Acute on chronic diastolic (congestive) heart failure: Secondary | ICD-10-CM

## 2018-10-14 DIAGNOSIS — E873 Alkalosis: Secondary | ICD-10-CM

## 2018-10-14 DIAGNOSIS — B9729 Other coronavirus as the cause of diseases classified elsewhere: Secondary | ICD-10-CM | POA: Diagnosis not present

## 2018-10-14 NOTE — Telephone Encounter (Signed)
Transition Care Management Follow-up Telephone Call  Date of discharge and from where: 10/12/2018 [place]   How have you been since you were released from the hospital? Better but not 100% yet  Any questions or concerns? No   Items Reviewed:  Did the pt receive and understand the discharge instructions provided? Yes   Medications obtained and verified? Yes   Any new allergies since your discharge? No   Dietary orders reviewed? Yes  Do you have support at home? Yes   Other (ie: DME, Home Health, etc) Home Health to come to home.   Functional Questionnaire: (I = Independent and D = Dependent) ADL's: I  Bathing/Dressing- I   Meal Prep- I  Eating- I  Maintaining continence- I  Transferring/Ambulation- I  Managing Meds- I   Follow up appointments reviewed:    PCP Hospital f/u appt confirmed? Yes  Scheduled to see [name] on 10/19/18 for [facility] f/u appt confirmed?   Are transportation arrangements needed? No   If their condition worsens, is the pt aware to call  their PCP or go to the ED? Yes  Was the patient provided with contact information for the PCP's office or ED? Yes  Was the pt encouraged to call back with questions or concerns? Yes

## 2018-10-16 DIAGNOSIS — I7300 Raynaud's syndrome without gangrene: Secondary | ICD-10-CM | POA: Diagnosis not present

## 2018-10-16 DIAGNOSIS — E873 Alkalosis: Secondary | ICD-10-CM | POA: Diagnosis not present

## 2018-10-16 DIAGNOSIS — I5033 Acute on chronic diastolic (congestive) heart failure: Secondary | ICD-10-CM | POA: Diagnosis not present

## 2018-10-16 DIAGNOSIS — I110 Hypertensive heart disease with heart failure: Secondary | ICD-10-CM | POA: Diagnosis not present

## 2018-10-16 DIAGNOSIS — J9601 Acute respiratory failure with hypoxia: Secondary | ICD-10-CM | POA: Diagnosis not present

## 2018-10-16 DIAGNOSIS — J45909 Unspecified asthma, uncomplicated: Secondary | ICD-10-CM | POA: Diagnosis not present

## 2018-10-16 DIAGNOSIS — B9729 Other coronavirus as the cause of diseases classified elsewhere: Secondary | ICD-10-CM | POA: Diagnosis not present

## 2018-10-16 DIAGNOSIS — J1289 Other viral pneumonia: Secondary | ICD-10-CM | POA: Diagnosis not present

## 2018-10-19 ENCOUNTER — Encounter: Payer: Medicare HMO | Admitting: Family

## 2018-10-19 ENCOUNTER — Encounter: Payer: Self-pay | Admitting: Family

## 2018-10-19 ENCOUNTER — Other Ambulatory Visit: Payer: Self-pay

## 2018-10-21 DIAGNOSIS — I7300 Raynaud's syndrome without gangrene: Secondary | ICD-10-CM | POA: Diagnosis not present

## 2018-10-21 DIAGNOSIS — E873 Alkalosis: Secondary | ICD-10-CM | POA: Diagnosis not present

## 2018-10-21 DIAGNOSIS — B9729 Other coronavirus as the cause of diseases classified elsewhere: Secondary | ICD-10-CM | POA: Diagnosis not present

## 2018-10-21 DIAGNOSIS — J45909 Unspecified asthma, uncomplicated: Secondary | ICD-10-CM | POA: Diagnosis not present

## 2018-10-21 DIAGNOSIS — I110 Hypertensive heart disease with heart failure: Secondary | ICD-10-CM | POA: Diagnosis not present

## 2018-10-21 DIAGNOSIS — J9601 Acute respiratory failure with hypoxia: Secondary | ICD-10-CM | POA: Diagnosis not present

## 2018-10-21 DIAGNOSIS — J1289 Other viral pneumonia: Secondary | ICD-10-CM | POA: Diagnosis not present

## 2018-10-21 DIAGNOSIS — I5033 Acute on chronic diastolic (congestive) heart failure: Secondary | ICD-10-CM | POA: Diagnosis not present

## 2018-10-22 NOTE — Progress Notes (Signed)
This encounter was created in error - please disregard.

## 2018-10-25 ENCOUNTER — Other Ambulatory Visit: Payer: Self-pay | Admitting: *Deleted

## 2018-10-25 DIAGNOSIS — G609 Hereditary and idiopathic neuropathy, unspecified: Secondary | ICD-10-CM

## 2018-10-25 DIAGNOSIS — M4800 Spinal stenosis, site unspecified: Secondary | ICD-10-CM

## 2018-10-25 DIAGNOSIS — M797 Fibromyalgia: Secondary | ICD-10-CM

## 2018-10-25 DIAGNOSIS — M5430 Sciatica, unspecified side: Secondary | ICD-10-CM

## 2018-10-25 DIAGNOSIS — G894 Chronic pain syndrome: Secondary | ICD-10-CM

## 2018-10-25 MED ORDER — OXYCODONE HCL 15 MG PO TABS: 15.0000 mg | ORAL_TABLET | ORAL | 0 refills | Status: DC | PRN

## 2018-10-25 NOTE — Telephone Encounter (Signed)
Patient requested refill [place] Verified LR: 09/17/2018 Pended Rx and sent to [facility] for approval.

## 2018-10-26 ENCOUNTER — Other Ambulatory Visit: Payer: Self-pay

## 2018-10-26 ENCOUNTER — Encounter: Payer: Self-pay | Admitting: Family

## 2018-10-26 ENCOUNTER — Ambulatory Visit (INDEPENDENT_AMBULATORY_CARE_PROVIDER_SITE_OTHER): Payer: Medicare HMO | Admitting: Family

## 2018-10-26 ENCOUNTER — Telehealth: Payer: Self-pay | Admitting: *Deleted

## 2018-10-26 DIAGNOSIS — F331 Major depressive disorder, recurrent, moderate: Secondary | ICD-10-CM

## 2018-10-26 DIAGNOSIS — I10 Essential (primary) hypertension: Secondary | ICD-10-CM | POA: Diagnosis not present

## 2018-10-26 DIAGNOSIS — I5032 Chronic diastolic (congestive) heart failure: Secondary | ICD-10-CM

## 2018-10-26 DIAGNOSIS — W19XXXA Unspecified fall, initial encounter: Secondary | ICD-10-CM | POA: Diagnosis not present

## 2018-10-26 DIAGNOSIS — Y92009 Unspecified place in unspecified non-institutional (private) residence as the place of occurrence of the external cause: Secondary | ICD-10-CM | POA: Diagnosis not present

## 2018-10-26 DIAGNOSIS — M797 Fibromyalgia: Secondary | ICD-10-CM

## 2018-10-26 DIAGNOSIS — J189 Pneumonia, unspecified organism: Secondary | ICD-10-CM | POA: Diagnosis not present

## 2018-10-26 NOTE — Progress Notes (Addendum)
This service is provided via telemedicine  No vital signs collected/recorded due to the encounter was a telemedicine visit.   Location of patient (ex: home, work):  Home  Patient consents to a telephone visit:  Yes  Location of the provider (ex: office, home):  Office  Names of all persons participating in the telemedicine service and their role in the encounter:  Asher Muir CMA, Mirranda Monrroy NP, and [name]   Time spent on call:  Asher Muir CMA spent 6 Minutes with patient on phone    Ceferino Lang, [name], NP  Patient Care Team: Eddi Hymes, [name], NP as PCP - General (Family Medicine)  Extended Emergency Contact Information Primary Emergency Contact: Lewis,Derrick  [place] Phone: [phone] Relation: Son Secondary Emergency Contact: Jerrye Beavers [place] Mobile Phone: [phone] Relation: Daughter  Goals of care: Advanced Directive information Advanced Directives 10/06/2018  Does Patient Have a Medical Advance Directive? Yes  Type of Advance Directive Healthcare Power of Attorney  Does patient want to make changes to medical advance directive? No - Patient declined  Copy of Healthcare Power of Attorney in Chart? No - copy requested  Would patient like information on creating a medical advance directive? -     Chief Complaint  Patient presents with  . Transitions Of Care    Hospital Follow up 10/06/2018 - 10/12/2018  patient still c/o thick congestion in throat and inability to eat a lot, mouth is very dry and causing her to cough     HPI:  Pt is a 68 y.o. female seen today  for transition of care from recent hospital 10/06/2018 - 10/12/2018 for acute hypoxic respiratory failure due to COVID-19.she had a Chest X-ray which showed infiltrate/pulmonary edema.He was treated with 5 days of I.V Rocephin and azithromycin.she was also treated with 5 days of plaquenil,vitamin C and Zinc.Her respiratory failure was thought due to  multifactorial secondary to volume overload, infectious due to COVID-19 and community Acquired pneumonia due to COVID-19.Her oxygen saturation was 80% required 6 liters of oxygen.she had I.V diuretic for fluid overload and discharged on lasix 40 mg tablet orally.she had hypokalemia and was repleted.  She states continues to feel weak,has dry mouth and thick mucus from the throat.Has no appetite.Also still has slight cough and some shortness of breath but has improved compared to when she was in the hospital. She denies any fever,chillls or sweats. She has been on Quarantine since hospital discharge 10/12/2018 per health Department.she has been lying in bed most of the time wakes up to the bathroom and kitchen.she states sat on the floor in the kitchen due to weakness first day when she was discharged from the hospital.she also had another fall episode on Thursday 10/21/2018.she had physical Therapy coming to work with her but states asked therapist to stop coming due to conflict with visit time.she would like another Paramedic.     Past Medical History:  Diagnosis Date  . Allergy   . Anxiety   . Arthritis    "99% of my body" (11/11/2016)  . Asthma   . Bursitis of both hips   . Cataract   . Cervical scoliosis   . Chronic back pain    "all over my back" (11/11/2016)  . Depressive disorder, not elsewhere classified   . Enthesopathy of hip region   . Environmental allergies    "I take Claritin qd; 365 days/year" (11/11/2016)  . Essential hypertension, benign   . Fibromyalgia   .  Frequent falls   . GERD (gastroesophageal reflux disease)   . Headache    "at least 1/wk; may last for 2 days or so" (11/11/2016)  . History of blood transfusion 1985   w/hysterectomy  . Lumbar stenosis   . Migraine    "a few/month" (11/11/2016)  . Muscle weakness (generalized)   . Myalgia and myositis, unspecified   . Osteoporosis   . Other abnormal blood chemistry   . Other malaise and fatigue   . Raynaud's  syndrome   . Sciatic nerve pain, left   . Sciatica   . Unspecified vitamin D deficiency    Past Surgical History:  Procedure Laterality Date  . ABDOMINAL HYSTERECTOMY  1985  . CESAREAN SECTION  1976; 1979  . SHOULDER OPEN ROTATOR CUFF REPAIR Left     No Known Allergies  Allergies as of 10/26/2018   No Known Allergies     Medication List       Accurate as of October 26, 2018 11:15 AM. Always use your most recent med list.        acetaminophen 500 MG tablet Commonly known as:  TYLENOL Take 500 mg by mouth every 6 (six) hours as needed for moderate pain or fever.   albuterol 108 (90 Base) MCG/ACT inhaler Commonly known as:  Ventolin HFA INHALE 2 PUFFS  EVERY 6 (SIX) HOURS AS NEEDED FOR WHEEZING OR SHORTNESS OF BREATH.   amitriptyline 100 MG tablet Commonly known as:  ELAVIL Take 1 tablet (100 mg total) by mouth at bedtime. For mood   antiseptic oral rinse Liqd 15 mLs by Mouth Rinse route as needed for dry mouth.   ascorbic acid 500 MG tablet Commonly known as:  VITAMIN C Take 1 tablet (500 mg total) by mouth 2 (two) times daily.   aspirin EC 81 MG tablet Take 81 mg by mouth daily.   carboxymethylcellulose 0.5 % Soln Commonly known as:  REFRESH PLUS Place 2 drops into both eyes 2 (two) times daily as needed (dry eyes).   citalopram 20 MG tablet Commonly known as:  CELEXA Take 1 tablet (20 mg total) by mouth daily.   famotidine 20 MG tablet Commonly known as:  PEPCID Take 1 tablet (20 mg total) by mouth at bedtime.   fluticasone 50 MCG/ACT nasal spray Commonly known as:  FLONASE Place 2 sprays into both nostrils daily as needed for allergies or rhinitis.   furosemide 40 MG tablet Commonly known as:  LASIX Take 1 tablet (40 mg total) by mouth daily.   gabapentin 400 MG capsule Commonly known as:  NEURONTIN TAKE 3 CAPSULES TWICE DAILY  AND TAKE 2 CAPSULES AT BEDTIME  FOR  NEUROPATHY   guaiFENesin 600 MG 12 hr tablet Commonly known as:  MUCINEX Take 2  tablets (1,200 mg total) by mouth 2 (two) times daily.   loratadine 10 MG tablet Commonly known as:  CLARITIN Take 10 mg by mouth daily.   methocarbamol 500 MG tablet Commonly known as:  ROBAXIN Take 1 tablet (500 mg total) by mouth 2 (two) times daily. For muscle spasm   metoprolol tartrate 50 MG tablet Commonly known as:  LOPRESSOR TAKE ONE TABLET TWICE DAILY FOR BLOOD PRESSURE   oxyCODONE 15 MG immediate release tablet Commonly known as:  Roxicodone Take 1 tablet (15 mg total) by mouth every 4 (four) hours as needed for pain.   Vitamin D3 50 MCG (2000 UT) Tabs Take 2,000 Units by mouth daily.   zinc sulfate 220 (50 Zn) MG  capsule Take 1 capsule (220 mg total) by mouth daily.       Review of Systems  Constitutional: Positive for appetite change and fatigue. Negative for chills and fever.  HENT: Positive for dental problem. Negative for congestion, rhinorrhea, sinus pressure, sinus pain, sneezing, sore throat and trouble swallowing.        X 2 broken teeth right awaiting for dental office to open once COVID-19 restrictions are over.No signs of infections.  Eyes: Negative for discharge, redness, itching and visual disturbance.  Respiratory: Negative for chest tightness and wheezing.        Still has slight cough and shortness of breath but has improved   Cardiovascular: Negative for chest pain, palpitations and leg swelling.  Gastrointestinal: Negative for abdominal distention, abdominal pain, constipation, diarrhea, nausea and vomiting.  Endocrine: Negative for cold intolerance, heat intolerance, polydipsia, polyphagia and polyuria.  Genitourinary: Negative for difficulty urinating, dysuria, flank pain, frequency and urgency.  Musculoskeletal: Positive for myalgias.  Skin: Negative for color change, pallor and rash.  Neurological: Positive for headaches. Negative for dizziness, syncope and light-headedness.       Generalized weakness  Psychiatric/Behavioral: Negative for  agitation and sleep disturbance. The patient is not nervous/anxious.     Immunization History  Administered Date(s) Administered  . Influenza,inj,Quad PF,6+ Mos 05/23/2015, 05/28/2016, 03/24/2018  . Influenza-Unspecified 07/07/2012, 02/28/2014  . Pneumococcal Conjugate-13 07/14/2014  . Pneumococcal Polysaccharide-23 09/11/2017  . Tdap 01/26/2016   Pertinent  Health Maintenance Due  Topic Date Due  . COLONOSCOPY  10/18/2001  . INFLUENZA VACCINE  01/29/2019  . MAMMOGRAM  09/04/2019  . DEXA SCAN  Completed  . PNA vac Low Risk Adult  Completed   Fall Risk  10/26/2018 10/04/2018 09/28/2018 09/03/2018 09/03/2018  Falls in the past year? 1 0 Number falls in past yr: 1 0 Comment - - - - -  Injury with Fall? 1 0 0 0 0  Comment - - - - -  Risk Factor Category  - - - - -  Risk for fall due to : - - - Impaired mobility;Medication side effect;History of fall(s);Impaired balance/gait -  Follow up - - - Falls prevention discussed -   There were no vitals filed for this visit. There is no height or weight on file to calculate BMI. Physical Exam   Unable to complete on Telephone visit.   Labs reviewed: Recent Labs    10/10/18 0402 10/11/18 0344 10/12/18 0402  NA 138 135 136  K 4.0 3.6 4.1  CL 98 96* 99  CO2 GLUCOSE 89 90 87  BUN CREATININE 0.72 0.87 0.89  CALCIUM 9.1 8.9 9.0  MG 2.2 2.2 2.1   Recent Labs    10/09/18 0507 10/10/18 0402 10/11/18 0344  AST ALT ALKPHOS 72 78 71  BILITOT 0.6 0.5 0.7  PROT 7.0 7.5 7.5  ALBUMIN 2.9* 3.0* 3.2*   Recent Labs    10/09/18 0507 10/10/18 0402 10/11/18 0344  WBC 7.7 8.0 8.6  NEUTROABS 3.6 3.5 4.0  HGB 11.5* 11.9* 12.1  HCT 36.5 37.7 37.8  MCV 87.3 88.5 88.9  PLT 311 371 428*   Lab Results  Component Value Date   TSH 2.96 09/03/2018   Lab Results  Component Value Date   HGBA1C 5.4 09/03/2018   Lab Results  Component Value Date   CHOL 189 09/03/2018   HDL  58 09/03/2018    LDLCALC 112 (H) 09/03/2018   TRIG 88 09/03/2018   CHOLHDL 3.3 09/03/2018    Significant Diagnostic Results in last 30 days:  Dg Chest Port 1 View  Result Date: 10/06/2018 CLINICAL DATA:  Initial evaluation for acute shortness of breath, fever, chills. EXAM: PORTABLE CHEST 1 VIEW COMPARISON:  Prior radiograph from 10/05/2018. FINDINGS: Mild cardiomegaly, stable. Mediastinal silhouette within normal limits. Lungs hypoinflated with elevation left hemidiaphragm, similar to previous. Diffusely increased pulmonary vascular congestion with interstitial prominence, compatible with pulmonary interstitial edema. Superimposed linear atelectasis and/or scarring within the mid and lower lungs. There is increased confluent opacity at the left lung base, suspicious for infiltrate given history of fever. No pleural effusion. No pneumothorax. No acute osseous finding. IMPRESSION: 1. Cardiomegaly with mild diffuse pulmonary interstitial edema. 2. Superimposed confluent opacity at the left lung base, increased from previous, suspicious for possible infiltrate given provided history of fever. Electronically Signed   By: Rise Mu M.D.   On: 10/06/2018 03:41   Dg Chest Portable 1 View  Result Date: 10/05/2018 CLINICAL DATA:  9-10 days ago had body aches and a "Light fever" with a cough, hx of asthma, called her PCP and they told her she may have covid and to self quarantine, has been doing that for 8 days and has not gotten better, using albuterol inhaler without improvement. EXAM: PORTABLE CHEST 1 VIEW COMPARISON:  03/03/2018 FINDINGS: Cardiac silhouette is normal in size. No mediastinal or hilar masses. There is no evidence of adenopathy. Lungs show prominent bronchovascular markings. There is linear opacity in the mid lungs and lung bases consistent with atelectasis and/or scarring. A component of atelectasis is favored since there has been an increase since the prior radiographs. No lung consolidation. No  convincing pleural effusion and no pneumothorax. Skeletal structures are grossly intact. IMPRESSION: 1. No convincing pneumonia. 2. Linear/reticular type opacities in the lung bases and mid lungs consistent with scarring and atelectasis. Chronic prominence of the bronchovascular markings bilaterally. Electronically Signed   By: Amie Portland M.D.   On: 10/05/2018 12:23    Assessment/Plan  1. Essential hypertension Last blood pressure checked by [facility] nurse was normal but patient does not check regularly lost her B/p cuff.continue on metoprolol 50 mg tablet twice daily and furosemide 40 mg tablet daily. - CBC/diff and CMP to be drawn by [facility]    2. Chronic diastolic congestive heart failure (HCC) Shortness of breath has improved.No edema or worsening cough.weight has been stable.continue on metoprolol 50 mg tablet twice daily and furosemide 40 mg tablet daily.encouraged to check weight daily.  3. Community acquired pneumonia of left lung, unspecified part of lung Afebrile.cough and shortness of breath has improved. - follow chest X-ray 11/25/2018 [facility]   4. COVID-19 virus infection - Afebrile. - Still has generalized weakness,no appetite,dry mouth,yellow mucus from throat but cough and shortness of breath has improved. - last day of 14 days Quarantine today per Health department guidelines. - encouraged to increase fluid intake/fruits eat small but frequent meals - wear mask and maintain hand washing per CDC guidelines.  - Encouraged to work with Physical therapy for ROM,exercise and muscle strengthening.patient had stopped PT due to visit  time conflict.Patient will call [facility] company to arrange visit time with another Physical Therapist.  - continue on vitamin C and Zinc supplements.  - encouraged to notify provider's office if symptoms worsen or not improved or go to ED.   5. Fibromyalgia Pain under control on  oxycodone 15 mg tablet.  6. Fall episode  Encouraged to  restart physical Therapy.Fall and safety precautions. Increase fluid/soups  and oral intake to prevent generalized weakness.Physical  and occupational therapy to evaluate and treat as indicated for gait stability,ROM,Exercise and muscle strengthening.   7.Major Depression  Mood stable.continue on citalopram 20 mg tablet daily and amitriptyline 100 mg tablet daily at bedtime.  Family/ staff Communication: Reviewed plan of care with patient and facility Nurse supervisor   Labs/tests ordered: CBC/diff,[facility]   Time spent with patient 21 minutes >50% time spent counseling; reviewing medical record; tests; labs; and developing future plan of care.  Caesar Bookmaninah C Anakaren Campion, NP

## 2018-10-26 NOTE — Telephone Encounter (Signed)
Received fax from [employer] #[phone] [employer]. Placed form in Melinda [name]'s folder for review. To be faxed back to [employer].

## 2018-10-27 NOTE — Addendum Note (Signed)
Addended byRicharda Blade C on: 10/27/2018 12:26 PM   Modules accepted: Level of Service

## 2018-10-27 NOTE — Addendum Note (Signed)
Addended byRicharda Blade C on: 10/27/2018 12:16 PM   Modules accepted: Level of Service

## 2018-10-28 ENCOUNTER — Other Ambulatory Visit: Payer: Self-pay | Admitting: Internal Medicine

## 2018-10-28 DIAGNOSIS — I5032 Chronic diastolic (congestive) heart failure: Secondary | ICD-10-CM | POA: Diagnosis not present

## 2018-10-28 DIAGNOSIS — E873 Alkalosis: Secondary | ICD-10-CM | POA: Diagnosis not present

## 2018-10-28 DIAGNOSIS — J9601 Acute respiratory failure with hypoxia: Secondary | ICD-10-CM | POA: Diagnosis not present

## 2018-10-28 DIAGNOSIS — B9729 Other coronavirus as the cause of diseases classified elsewhere: Secondary | ICD-10-CM | POA: Diagnosis not present

## 2018-10-28 DIAGNOSIS — J1289 Other viral pneumonia: Secondary | ICD-10-CM | POA: Diagnosis not present

## 2018-10-28 DIAGNOSIS — J45909 Unspecified asthma, uncomplicated: Secondary | ICD-10-CM | POA: Diagnosis not present

## 2018-10-28 DIAGNOSIS — I10 Essential (primary) hypertension: Secondary | ICD-10-CM | POA: Diagnosis not present

## 2018-10-28 DIAGNOSIS — I7300 Raynaud's syndrome without gangrene: Secondary | ICD-10-CM | POA: Diagnosis not present

## 2018-10-28 DIAGNOSIS — I5033 Acute on chronic diastolic (congestive) heart failure: Secondary | ICD-10-CM | POA: Diagnosis not present

## 2018-10-28 DIAGNOSIS — I110 Hypertensive heart disease with heart failure: Secondary | ICD-10-CM | POA: Diagnosis not present

## 2018-10-28 LAB — HEPATIC FUNCTION PANEL
ALT: 9 (ref 7–35)
AST: 11 — AB (ref 13–35)
Alkaline Phosphatase: 61 (ref 25–125)

## 2018-10-28 LAB — CBC AND DIFFERENTIAL
HCT: 35 — AB (ref 36–46)
Hemoglobin: 11.8 — AB (ref 12.0–16.0)
Neutrophils Absolute: 2
Platelets: 422 — AB (ref 150–399)
WBC: 5.3

## 2018-10-28 LAB — BASIC METABOLIC PANEL
BUN: 15 (ref 4–21)
Creatinine: 1.3 — AB (ref 0.5–1.1)
Glucose: 83
Potassium: 4.9 (ref 3.4–5.3)
Sodium: 137 (ref 137–147)

## 2018-10-29 LAB — CBC WITH DIFFERENTIAL/PLATELET
Basophils Absolute: 0.1 10*3/uL (ref 0.0–0.2)
Basos: 2 %
EOS (ABSOLUTE): 0.4 10*3/uL (ref 0.0–0.4)
Eos: 7 %
Hematocrit: 35 % (ref 34.0–46.6)
Hemoglobin: 11.8 g/dL (ref 11.1–15.9)
Immature Grans (Abs): 0 10*3/uL (ref 0.0–0.1)
Immature Granulocytes: 0 %
Lymphocytes Absolute: 2.4 10*3/uL (ref 0.7–3.1)
Lymphs: 44 %
MCH: 27.3 pg (ref 26.6–33.0)
MCHC: 33.7 g/dL (ref 31.5–35.7)
MCV: 81 fL (ref 79–97)
Monocytes Absolute: 0.6 10*3/uL (ref 0.1–0.9)
Monocytes: 11 %
Neutrophils Absolute: 1.9 10*3/uL (ref 1.4–7.0)
Neutrophils: 36 %
Platelets: 422 10*3/uL (ref 150–450)
RBC: 4.32 x10E6/uL (ref 3.77–5.28)
RDW: 13.3 % (ref 11.7–15.4)
WBC: 5.3 10*3/uL (ref 3.4–10.8)

## 2018-10-29 LAB — COMPREHENSIVE METABOLIC PANEL
ALT: 9 IU/L (ref 0–32)
AST: 11 IU/L (ref 0–40)
Albumin/Globulin Ratio: 1.3 (ref 1.2–2.2)
Albumin: 4 g/dL (ref 3.8–4.8)
Alkaline Phosphatase: 61 IU/L (ref 39–117)
BUN/Creatinine Ratio: 12 (ref 12–28)
BUN: 15 mg/dL (ref 8–27)
Bilirubin Total: 0.6 mg/dL (ref 0.0–1.2)
CO2: 19 mmol/L — ABNORMAL LOW (ref 20–29)
Calcium: 9.3 mg/dL (ref 8.7–10.3)
Chloride: 99 mmol/L (ref 96–106)
Creatinine, Ser: 1.26 mg/dL — ABNORMAL HIGH (ref 0.57–1.00)
GFR calc Af Amer: 51 mL/min/{1.73_m2} — ABNORMAL LOW (ref >59)
GFR calc non Af Amer: 44 mL/min/{1.73_m2} — ABNORMAL LOW (ref >59)
Globulin, Total: 3 g/dL (ref 1.5–4.5)
Glucose: 83 mg/dL (ref 65–99)
Potassium: 4.9 mmol/L (ref 3.5–5.2)
Sodium: 137 mmol/L (ref 134–144)
Total Protein: 7 g/dL (ref 6.0–8.5)

## 2018-11-03 ENCOUNTER — Encounter: Payer: Self-pay | Admitting: *Deleted

## 2018-11-04 DIAGNOSIS — J45909 Unspecified asthma, uncomplicated: Secondary | ICD-10-CM | POA: Diagnosis not present

## 2018-11-04 DIAGNOSIS — E873 Alkalosis: Secondary | ICD-10-CM | POA: Diagnosis not present

## 2018-11-04 DIAGNOSIS — B9729 Other coronavirus as the cause of diseases classified elsewhere: Secondary | ICD-10-CM | POA: Diagnosis not present

## 2018-11-04 DIAGNOSIS — I7300 Raynaud's syndrome without gangrene: Secondary | ICD-10-CM | POA: Diagnosis not present

## 2018-11-04 DIAGNOSIS — J1289 Other viral pneumonia: Secondary | ICD-10-CM | POA: Diagnosis not present

## 2018-11-04 DIAGNOSIS — I5033 Acute on chronic diastolic (congestive) heart failure: Secondary | ICD-10-CM | POA: Diagnosis not present

## 2018-11-04 DIAGNOSIS — J9601 Acute respiratory failure with hypoxia: Secondary | ICD-10-CM | POA: Diagnosis not present

## 2018-11-04 DIAGNOSIS — U071 COVID-19: Secondary | ICD-10-CM | POA: Diagnosis not present

## 2018-11-04 DIAGNOSIS — I110 Hypertensive heart disease with heart failure: Secondary | ICD-10-CM | POA: Diagnosis not present

## 2018-11-09 ENCOUNTER — Other Ambulatory Visit: Payer: Self-pay | Admitting: Nurse Practitioner

## 2018-11-09 DIAGNOSIS — J1289 Other viral pneumonia: Secondary | ICD-10-CM | POA: Diagnosis not present

## 2018-11-09 DIAGNOSIS — J45909 Unspecified asthma, uncomplicated: Secondary | ICD-10-CM | POA: Diagnosis not present

## 2018-11-09 DIAGNOSIS — U071 COVID-19: Secondary | ICD-10-CM | POA: Diagnosis not present

## 2018-11-09 DIAGNOSIS — B9729 Other coronavirus as the cause of diseases classified elsewhere: Secondary | ICD-10-CM | POA: Diagnosis not present

## 2018-11-09 DIAGNOSIS — I110 Hypertensive heart disease with heart failure: Secondary | ICD-10-CM | POA: Diagnosis not present

## 2018-11-09 DIAGNOSIS — E873 Alkalosis: Secondary | ICD-10-CM | POA: Diagnosis not present

## 2018-11-09 DIAGNOSIS — I5033 Acute on chronic diastolic (congestive) heart failure: Secondary | ICD-10-CM | POA: Diagnosis not present

## 2018-11-09 DIAGNOSIS — I7300 Raynaud's syndrome without gangrene: Secondary | ICD-10-CM | POA: Diagnosis not present

## 2018-11-09 DIAGNOSIS — J9601 Acute respiratory failure with hypoxia: Secondary | ICD-10-CM | POA: Diagnosis not present

## 2018-11-24 ENCOUNTER — Other Ambulatory Visit: Payer: Self-pay | Admitting: *Deleted

## 2018-11-24 DIAGNOSIS — M5430 Sciatica, unspecified side: Secondary | ICD-10-CM

## 2018-11-24 DIAGNOSIS — M4800 Spinal stenosis, site unspecified: Secondary | ICD-10-CM

## 2018-11-24 DIAGNOSIS — M797 Fibromyalgia: Secondary | ICD-10-CM

## 2018-11-24 DIAGNOSIS — G894 Chronic pain syndrome: Secondary | ICD-10-CM

## 2018-11-24 DIAGNOSIS — G609 Hereditary and idiopathic neuropathy, unspecified: Secondary | ICD-10-CM

## 2018-11-24 MED ORDER — OXYCODONE HCL 15 MG PO TABS: 15.0000 mg | ORAL_TABLET | ORAL | 0 refills | Status: DC | PRN

## 2018-11-24 NOTE — Telephone Encounter (Signed)
Patient requested refill [place] Database Verified LR: 10/25/2018 Pended Rx and sent to [facility] for approval.

## 2018-11-25 ENCOUNTER — Other Ambulatory Visit: Payer: Self-pay | Admitting: Family

## 2018-11-25 ENCOUNTER — Other Ambulatory Visit: Payer: Medicare HMO

## 2018-11-25 DIAGNOSIS — I10 Essential (primary) hypertension: Secondary | ICD-10-CM

## 2018-12-02 ENCOUNTER — Telehealth: Payer: Self-pay | Admitting: *Deleted

## 2018-12-02 NOTE — Telephone Encounter (Signed)
May send letter per patient's request. patient requires Vit D 2000 units per Dexa scan done 09/03/2017 results revealed osteopenia high risk for fracture.ASA also necessary for cardiovascular event prophylaxis due to hypertension.

## 2018-12-02 NOTE — Telephone Encounter (Signed)
Patient called requesting letter for [employer]. Stated that she needs a letter on our letter head stating that she is required to take Vitamin D 2000 units and Aspirin 81mg  so she doesn't have to pay out of pocket.  Stated that she will call back with a fax number to fax the completed letter to later.  Please Advise.

## 2018-12-03 ENCOUNTER — Ambulatory Visit: Payer: Medicare HMO | Admitting: Family

## 2018-12-03 ENCOUNTER — Ambulatory Visit (INDEPENDENT_AMBULATORY_CARE_PROVIDER_SITE_OTHER): Payer: Medicare HMO | Admitting: Family

## 2018-12-03 ENCOUNTER — Encounter: Payer: Self-pay | Admitting: Family

## 2018-12-03 ENCOUNTER — Other Ambulatory Visit: Payer: Self-pay

## 2018-12-03 VITALS — Ht 64.0 in | Wt 225.0 lb

## 2018-12-03 DIAGNOSIS — M797 Fibromyalgia: Secondary | ICD-10-CM | POA: Diagnosis not present

## 2018-12-03 DIAGNOSIS — G8929 Other chronic pain: Secondary | ICD-10-CM | POA: Diagnosis not present

## 2018-12-03 DIAGNOSIS — I5032 Chronic diastolic (congestive) heart failure: Secondary | ICD-10-CM | POA: Diagnosis not present

## 2018-12-03 DIAGNOSIS — M25562 Pain in left knee: Secondary | ICD-10-CM | POA: Diagnosis not present

## 2018-12-03 DIAGNOSIS — R2681 Unsteadiness on feet: Secondary | ICD-10-CM

## 2018-12-03 DIAGNOSIS — K219 Gastro-esophageal reflux disease without esophagitis: Secondary | ICD-10-CM | POA: Diagnosis not present

## 2018-12-03 DIAGNOSIS — Z0000 Encounter for general adult medical examination without abnormal findings: Secondary | ICD-10-CM

## 2018-12-03 DIAGNOSIS — J4520 Mild intermittent asthma, uncomplicated: Secondary | ICD-10-CM

## 2018-12-03 DIAGNOSIS — R7303 Prediabetes: Secondary | ICD-10-CM | POA: Diagnosis not present

## 2018-12-03 DIAGNOSIS — I10 Essential (primary) hypertension: Secondary | ICD-10-CM

## 2018-12-03 NOTE — Progress Notes (Signed)
Subjective:   Melinda [name] is a 68 y.o. female who presents for Medicare Annual (Subsequent) preventive examination.  Review of Systems:   Cardiac Risk Factors include: advanced age (>62men, >34 women);hypertension;obesity (BMI >30kg/m2)     Objective:     Vitals: Ht 5\' 4"  (1.626 m)   Wt 225 lb (102.1 kg)   BMI 38.62 kg/m   Body mass index is 38.62 kg/m.  Advanced Directives 10/06/2018 10/06/2018 09/03/2018 09/03/2018 04/06/2018 03/24/2018 03/02/2018  Does Patient Have a Medical Advance Directive? Yes Yes No No No No No  Type of Advance Directive Healthcare Power of [employer] Power of Attorney - - - - -  Does patient want to make changes to medical advance directive? No - Patient declined No - Patient declined - - - - -  Copy of Healthcare Power of Attorney in Chart? No - copy requested No - copy requested - - - - -  Would patient like information on creating a medical advance directive? - - (No Data) - No - Patient declined No - Patient declined No - Patient declined    Tobacco Social History   Tobacco Use  Smoking Status Never Smoker  Smokeless Tobacco Never Used     Counseling given: Not Answered   Clinical Intake:  Pre-visit preparation completed: No  Pain : 0-10 Pain Type: Chronic pain Pain Location: Back Pain Orientation: Lower Pain Radiating Towards: legs  Pain Descriptors / Indicators: Throbbing Pain Onset: Other (comment)(6 months ) Pain Frequency: Constant Pain Relieving Factors: Pain medication, Effect of Pain on Daily Activities: yes. walking   Pain Type: Chronic pain Pain Location: Other (Comment)(left knee,back ) Pain Descriptors / Indicators: Throbbing Pain Frequency: Constant Pain Relieving Factors: Pain medication,  BMI - recorded: 41.54 Nutritional Status: BMI > 30  Obese Nutritional Risks: None Diabetes: No  How often do you need to have someone help you when you read instructions, pamphlets, or other written materials from your  doctor or pharmacy?: 1 - Never What is the last grade level you completed in school?: 1 year college   Interpreter Needed?: No  Information entered by :: Landyn Lorincz FNP-C   Past Medical History:  Diagnosis Date  . Allergy   . Anxiety   . Arthritis    "99% of my body" (11/11/2016)  . Asthma   . Bursitis of both hips   . Cataract   . Cervical scoliosis   . Chronic back pain    "all over my back" (11/11/2016)  . Depressive disorder, not elsewhere classified   . Enthesopathy of hip region   . Environmental allergies    "I take Claritin qd; 365 days/year" (11/11/2016)  . Essential hypertension, benign   . Fibromyalgia   . Frequent falls   . GERD (gastroesophageal reflux disease)   . Headache    "at least 1/wk; may last for 2 days or so" (11/11/2016)  . History of blood transfusion 1985   w/hysterectomy  . Lumbar stenosis   . Migraine    "a few/month" (11/11/2016)  . Muscle weakness (generalized)   . Myalgia and myositis, unspecified   . Osteoporosis   . Other abnormal blood chemistry   . Other malaise and fatigue   . Raynaud's syndrome   . Sciatic nerve pain, left   . Sciatica   . Unspecified vitamin D deficiency    Past Surgical History:  Procedure Laterality Date  . ABDOMINAL HYSTERECTOMY  1985  . CESAREAN SECTION  1976; 1979  . SHOULDER OPEN  ROTATOR CUFF REPAIR Left    Family History  Problem Relation Age of Onset  . Dementia Mother   . Heart disease Mother   . Hypertension Mother   . Diabetes Mother   . Diabetes Sister   . Cancer Sister        lung  . Cancer Other        Nepher (Mothers side)  . Diabetes Maternal Grandmother   . Arthritis Maternal Grandmother   . Diabetes Cousin        Mother's side  . Arthritis Sister   . Arthritis Cousin        Mother's side   . Colon cancer Neg Hx   . Esophageal cancer Neg Hx   . Liver cancer Neg Hx   . Pancreatic cancer Neg Hx   . Rectal cancer Neg Hx   . Stomach cancer Neg Hx    Social History    Socioeconomic History  . Marital status: Divorced    Spouse name: Not on file  . Number of children: 3  . Years of education: 5113  . Highest education level: Not on file  Occupational History    Comment: retired  Engineer, productionocial Needs  . Financial resource strain: Not hard at all  . Food insecurity:    Worry: Never true    Inability: Never true  . Transportation needs:    Medical: No    Non-medical: No  Tobacco Use  . Smoking status: Never Smoker  . Smokeless tobacco: Never Used  Substance and Sexual Activity  . Alcohol use: No    Alcohol/week: 0.0 standard drinks  . Drug use: No  . Sexual activity: Yes  Lifestyle  . Physical activity:    Days per week: 0 days    Minutes per session: 0 min  . Stress: Rather much  Relationships  . Social connections:    Talks on phone: More than three times a week    Gets together: More than three times a week    Attends religious service: Never    Active member of club or organization: No    Attends meetings of clubs or organizations: Never    Relationship status: Widowed  Other Topics Concern  . Not on file  Social History Narrative   Patient lives with her grandson. Patient is retired.   Education some college.   Right handed.    Caffeine two cups daily.    Outpatient Encounter Medications as of 12/03/2018  Medication Sig  . acetaminophen (TYLENOL) 500 MG tablet Take 500 mg by mouth every 6 (six) hours as needed for moderate pain or fever.  Marland Kitchen. albuterol (VENTOLIN HFA) 108 (90 Base) MCG/ACT inhaler INHALE 2 PUFFS  EVERY 6 (SIX) HOURS AS NEEDED FOR WHEEZING OR SHORTNESS OF BREATH.  Marland Kitchen. amitriptyline (ELAVIL) 100 MG tablet Take 1 tablet (100 mg total) by mouth at bedtime. For mood  . antiseptic oral rinse (BIOTENE) LIQD 15 mLs by Mouth Rinse route as needed for dry mouth.  Marland Kitchen. aspirin EC 81 MG tablet Take 81 mg by mouth daily.  . carboxymethylcellulose (REFRESH PLUS) 0.5 % SOLN Place 2 drops into both eyes 2 (two) times daily as needed (dry eyes).    . Cholecalciferol (VITAMIN D3) 2000 units TABS Take 2,000 Units by mouth daily.   . citalopram (CELEXA) 20 MG tablet Take 1 tablet (20 mg total) by mouth daily.  . famotidine (PEPCID) 20 MG tablet Take 1 tablet (20 mg total) by mouth at bedtime.  .Marland Kitchen  fluticasone (FLONASE) 50 MCG/ACT nasal spray Place 2 sprays into both nostrils daily as needed for allergies or rhinitis.  . furosemide (LASIX) 40 MG tablet TAKE 1 TABLET BY MOUTH EVERY DAY  . gabapentin (NEURONTIN) 400 MG capsule TAKE 3 CAPSULES TWICE DAILY  AND TAKE 2 CAPSULES AT BEDTIME  FOR  NEUROPATHY  . guaiFENesin (MUCINEX) 600 MG 12 hr tablet Take 2 tablets (1,200 mg total) by mouth 2 (two) times daily.  Marland Kitchen. loratadine (CLARITIN) 10 MG tablet Take 10 mg by mouth daily.  . methocarbamol (ROBAXIN) 500 MG tablet Take 1 tablet (500 mg total) by mouth 2 (two) times daily. For muscle spasm  . metoprolol tartrate (LOPRESSOR) 50 MG tablet TAKE ONE TABLET TWICE DAILY FOR BLOOD PRESSURE  . oxyCODONE (ROXICODONE) 15 MG immediate release tablet Take 1 tablet (15 mg total) by mouth every 4 (four) hours as needed for pain.  . vitamin C (VITAMIN C) 500 MG tablet Take 1 tablet (500 mg total) by mouth 2 (two) times daily.  Marland Kitchen. zinc sulfate 220 (50 Zn) MG capsule Take 1 capsule (220 mg total) by mouth daily.   No facility-administered encounter medications on file as of 12/03/2018.     Activities of Daily Living In your present state of health, do you have any difficulty performing the following activities: 12/03/2018 10/06/2018  Hearing? N N  Vision? N N  Difficulty concentrating or making decisions? Y N  Comment remembering  -  Walking or climbing stairs? Y N  Comment knee pain  -  Dressing or bathing? N N  Doing errands, shopping? N N  Preparing Food and eating ? N -  Using the Toilet? N -  In the past six months, have you accidently leaked urine? Y -  Comment wears incontinent pads when outside  -  Do you have problems with loss of bowel control? N -   Managing your Medications? N -  Managing your Finances? N -  Housekeeping or managing your Housekeeping? Y -  Comment grandson assist with vaccuming carpet  -  Some recent data might be hidden    Patient Care Team: Sabena Winner, Donalee Citrininah C, NP as PCP - General (Family Medicine)    Assessment:   This is a routine wellness examination for Melinda KidneyDebra.  Exercise Activities and Dietary recommendations Current Exercise Habits: Structured exercise class, Type of exercise: stretching;strength training/weights, Time (Minutes): 60, Frequency (Times/Week): 5, Weekly Exercise (Minutes/Week): 300, Intensity: Moderate, Exercise limited by: Other - see comments(knee pain )  Goals    . Exercise 3x per week (30 min per time)     Pt would like to go back to the gym 3 days a week    . Weight (lb) < 200 lb (90.7 kg)     Starting 05/28/16, I will attempt to lose 50 lbs, over the next year and strengthen my leg muscles, so I can stand better.        Fall Risk Fall Risk  12/03/2018 10/26/2018 10/04/2018 09/28/2018 09/03/2018  Falls in the past year? 1 1 0 1 1  Number falls in past yr: 1 1 0 1 1  Comment - - - - -  Injury with Fall? 0 1 0 0 0  Comment - - - - -  Risk Factor Category  - - - - -  Risk for fall due to : - - - - Impaired mobility;Medication side effect;History of fall(s);Impaired balance/gait  Follow up - - - - Falls prevention discussed   Is the  patient's home free of loose throw rugs in walkways, pet beds, electrical cords, etc?   no      Grab bars in the bathroom? yes      Handrails on the stairs? N/A         Adequate lighting?   yes  Depression Screen PHQ 2/9 Scores 12/03/2018 09/03/2018 08/14/2017 01/02/2017  PHQ - 2 Score 0 6 0 0  PHQ- 9 Score - 27 - -     Cognitive Function MMSE - Mini Mental State Exam 09/03/2018 08/14/2017 05/28/2016  Orientation to time 5 5 5   Orientation to Place 5 5 5   Registration 3 3 3   Attention/ Calculation 5 5 5   Recall 0 0 0  Language- name 2 objects 2 2 2   Language-  repeat 1 1 1   Language- follow 3 step command 3 3 3   Language- read & follow direction 1 1 1   Write a sentence 1 1 1   Copy design 1 1 1   Total score 27 27 27      6CIT Screen 12/03/2018  What Year? 0 points  What month? 0 points  What time? 0 points  Count back from 20 0 points  Months in reverse 0 points  Repeat phrase 0 points  Total Score 0    Immunization History  Administered Date(s) Administered  . Influenza,inj,Quad PF,6+ Mos 05/23/2015, 05/28/2016, 03/24/2018  . Influenza-Unspecified 07/07/2012, 02/28/2014  . Pneumococcal Conjugate-13 07/14/2014  . Pneumococcal Polysaccharide-23 09/11/2017  . Tdap 01/26/2016    Qualifies for Shingles Vaccine? Declined   Screening Tests Health Maintenance  Topic Date Due  . COLONOSCOPY  10/18/2001  . INFLUENZA VACCINE  01/29/2019  . MAMMOGRAM  09/04/2019  . TETANUS/TDAP  01/25/2026  . DEXA SCAN  Completed  . Hepatitis C Screening  Completed  . PNA vac Low Risk Adult  Completed    Cancer Screenings: Lung: Low Dose CT Chest recommended if Age 62-80 years, 30 pack-year currently smoking OR have quit w/in 15years. Patient does not qualify. Breast:  Up to date on Mammogram? Yes   Up to date of Bone Density/Dexa? Yes Colorectal: due will call when ready for colonoscopy  Additional Screenings: Hepatitis C Screening: completed.     Plan:  -  Due for colonoscopy.will call when ready for colonoscopy.  I have personally reviewed and noted the following in the patient's chart:   . Medical and social history . Use of alcohol, tobacco or illicit drugs  . Current medications and supplements . Functional ability and status . Nutritional status . Physical activity . Advanced directives . List of other physicians . Hospitalizations, surgeries, and ER visits in previous 12 months . Vitals . Screenings to include cognitive, depression, and falls . Referrals and appointments  In addition, I have reviewed and discussed with patient  certain preventive protocols, quality metrics, and best practice recommendations. A written personalized care plan for preventive services as well as general preventive health recommendations were provided to patient.  Caesar Bookman, NP  12/03/2018

## 2018-12-03 NOTE — Patient Instructions (Signed)
Melinda [name] , Thank you for taking time to come for your Medicare Wellness Visit. I appreciate your ongoing commitment to your health goals. Please review the following plan we discussed and let me know if I can assist you in the future.   Screening recommendations/referrals: Colonoscopy Due for colonoscopy. call when ready so that we can send a referral for colonoscopy as discussed on visit. Mammogram: Up to date  Bone Density Up to date  Recommended yearly ophthalmology/optometry visit for glaucoma screening and checkup Recommended yearly dental visit for hygiene and checkup  Vaccinations: Influenza vaccine Up to date  Pneumococcal vaccine Up to date  Tdap vaccine Up to date  Shingles vaccine: Decline     Advanced directives: Yes  Conditions/risks identified: Advance age female >31 yrs,Obesity,Hypertension   Next appointment: 1 year   Preventive Care 91 Years and Older, Female Preventive care refers to lifestyle choices and visits with your health care provider that can promote health and wellness. What does preventive care include?  A yearly physical exam. This is also called an annual well check.  Dental exams once or twice a year.  Routine eye exams. Ask your health care provider how often you should have your eyes checked.  Personal lifestyle choices, including:  Daily care of your teeth and gums.  Regular physical activity.  Eating a healthy diet.  Avoiding tobacco and drug use.  Limiting alcohol use.  Practicing safe sex.  Taking low-dose aspirin every day.  Taking vitamin and mineral supplements as recommended by your health care provider. What happens during an annual well check? The services and screenings done by your health care provider during your annual well check will depend on your age, overall health, lifestyle risk factors, and family history of disease. Counseling  Your health care provider may ask you questions about your:  Alcohol use.   Tobacco use.  Drug use.  Emotional well-being.  Home and relationship well-being.  Sexual activity.  Eating habits.  History of falls.  Memory and ability to understand (cognition).  Work and work Astronomer.  Reproductive health. Screening  You may have the following tests or measurements:  Height, weight, and BMI.  Blood pressure.  Lipid and cholesterol levels. These may be checked every 5 years, or more frequently if you are over 59 years old.  Skin check.  Lung cancer screening. You may have this screening every year starting at age 86 if you have a 30-pack-year history of smoking and currently smoke or have quit within the past 15 years.  Fecal occult blood test (FOBT) of the stool. You may have this test every year starting at age 47.  Flexible sigmoidoscopy or colonoscopy. You may have a sigmoidoscopy every 5 years or a colonoscopy every 10 years starting at age 81.  Hepatitis C blood test.  Hepatitis B blood test.  Sexually transmitted disease (STD) testing.  Diabetes screening. This is done by checking your blood sugar (glucose) after you have not eaten for a while (fasting). You may have this done every 1-3 years.  Bone density scan. This is done to screen for osteoporosis. You may have this done starting at age 40.  Mammogram. This may be done every 1-2 years. Talk to your health care provider about how often you should have regular mammograms. Talk with your health care provider about your test results, treatment options, and if necessary, the need for more tests. Vaccines  Your health care provider may recommend certain vaccines, such as:  Influenza vaccine.  This is recommended every year.  Tetanus, diphtheria, and acellular pertussis (Tdap, Td) vaccine. You may need a Td booster every 10 years.  Zoster vaccine. You may need this after age 2.  Pneumococcal 13-valent conjugate (PCV13) vaccine. One dose is recommended after age 61.  Pneumococcal  polysaccharide (PPSV23) vaccine. One dose is recommended after age 89. Talk to your health care provider about which screenings and vaccines you need and how often you need them. This information is not intended to replace advice given to you by your health care provider. Make sure you discuss any questions you have with your health care provider. Document Released: 07/13/2015 Document Revised: 03/05/2016 Document Reviewed: 04/17/2015 Elsevier Interactive Patient Education  2017 [place] Prevention in the Home Falls can cause injuries. They can happen to people of all ages. There are many things you can do to make your home safe and to help prevent falls. What can I do on the outside of my home?  Regularly fix the edges of walkways and driveways and fix any cracks.  Remove anything that might make you trip as you walk through a door, such as a raised step or threshold.  Trim any bushes or trees on the path to your home.  Use bright outdoor lighting.  Clear any walking paths of anything that might make someone trip, such as rocks or tools.  Regularly check to see if handrails are loose or broken. Make sure that both sides of any steps have handrails.  Any raised decks and porches should have guardrails on the edges.  Have any leaves, snow, or ice cleared regularly.  Use sand or salt on walking paths during winter.  Clean up any spills in your garage right away. This includes oil or grease spills. What can I do in the bathroom?  Use night lights.  Install grab bars by the toilet and in the tub and shower. Do not use towel bars as grab bars.  Use non-skid mats or decals in the tub or shower.  If you need to sit down in the shower, use a plastic, non-slip stool.  Keep the floor dry. Clean up any water that spills on the floor as soon as it happens.  Remove soap buildup in the tub or shower regularly.  Attach bath mats securely with double-sided non-slip rug tape.   Do not have throw rugs and other things on the floor that can make you trip. What can I do in the bedroom?  Use night lights.  Make sure that you have a light by your bed that is easy to reach.  Do not use any sheets or blankets that are too big for your bed. They should not hang down onto the floor.  Have a firm chair that has side arms. You can use this for support while you get dressed.  Do not have throw rugs and other things on the floor that can make you trip. What can I do in the kitchen?  Clean up any spills right away.  Avoid walking on wet floors.  Keep items that you use a lot in easy-to-reach places.  If you need to reach something above you, use a strong step stool that has a grab bar.  Keep electrical cords out of the way.  Do not use floor polish or wax that makes floors slippery. If you must use wax, use non-skid floor wax.  Do not have throw rugs and other things on the floor that can make  you trip. What can I do with my stairs?  Do not leave any items on the stairs.  Make sure that there are handrails on both sides of the stairs and use them. Fix handrails that are broken or loose. Make sure that handrails are as long as the stairways.  Check any carpeting to make sure that it is firmly attached to the stairs. Fix any carpet that is loose or worn.  Avoid having throw rugs at the top or bottom of the stairs. If you do have throw rugs, attach them to the floor with carpet tape.  Make sure that you have a light switch at the top of the stairs and the bottom of the stairs. If you do not have them, ask someone to add them for you. What else can I do to help prevent falls?  Wear shoes that:  Do not have high heels.  Have rubber bottoms.  Are comfortable and fit you well.  Are closed at the toe. Do not wear sandals.  If you use a stepladder:  Make sure that it is fully opened. Do not climb a closed stepladder.  Make sure that both sides of the  stepladder are locked into place.  Ask someone to hold it for you, if possible.  Clearly mark and make sure that you can see:  Any grab bars or handrails.  First and last steps.  Where the edge of each step is.  Use tools that help you move around (mobility aids) if they are needed. These include:  Canes.  Walkers.  Scooters.  Crutches.  Turn on the lights when you go into a dark area. Replace any light bulbs as soon as they burn out.  Set up your furniture so you have a clear path. Avoid moving your furniture around.  If any of your floors are uneven, fix them.  If there are any pets around you, be aware of where they are.  Review your medicines with your doctor. Some medicines can make you feel dizzy. This can increase your chance of falling. Ask your doctor what other things that you can do to help prevent falls. This information is not intended to replace advice given to you by your health care provider. Make sure you discuss any questions you have with your health care provider. Document Released: 04/12/2009 Document Revised: 11/22/2015 Document Reviewed: 07/21/2014 Elsevier Interactive Patient Education  2017 [employer].

## 2018-12-03 NOTE — Progress Notes (Addendum)
Patient ID: Melinda [name], female   DOB: Sep 24, 1950, 68 y.o.   MRN: [id number] This service is provided via telemedicine  No vital signs collected/recorded due to the encounter was a telemedicine visit.   Location of patient (ex: home, work):  HOME  Patient consents to a telephone visit:  YES  Location of the provider (ex: office, home):  OFFICE  Name of any referring provider:  [facility] [name], NP  Names of all persons participating in the telemedicine service and their role in the encounter:  PATIENT, Edwin Dada, [place], [place], NP  Time spent on call:  11:15 .  Provider: Marlowe Sax FNP-C   [name], Nelda Bucks, NP  Patient Care Team: [name], Nelda Bucks, NP as PCP - General (Family Medicine)  Extended Emergency Contact Information Primary Emergency Contact: Glennie [place] Mobile Phone: [phone] Relation: Son Secondary Emergency Contact: Amparo [place] Mobile Phone: [phone] Relation: Daughter   Goals of care: Advanced Directive information Advanced Directives 10/06/2018  Does Patient Have a Medical Advance Directive? Yes  Type of Advance Directive [place]  Does patient want to make changes to medical advance directive? No - Patient declined  Copy of [place] in Chart? No - copy requested  Would patient like information on creating a medical advance directive? -     Chief Complaint  Patient presents with  . Medical Management of Chronic Issues    9mh follow-up    HPI:  Pt is a 68y.o. female seen today for medical management of chronic diseases.she states doing much better since follow up for COVID-19 visit from the hospital.  Hypertension - No log for review.On Metoprolol 50 mg tablet twice daily and ASA for cardioprotective effects.she denies any headache,dizziness or faintness.    Fibromyalgia -pain under control on oxycodone 15 mg tablet as needed.she denies  any signs of drowsy with medication.states takes Tylenol and oxycodone for breakthrough pain mostly.    Asthma - symptoms stable.Has not required her rescue inhaler.  GERD- Famotidine 20 mg tablet at bedtime previous ordered but did not pick up medication.states continues to have acid reflux and stopped using Zantac since recall. Chronic knee pain - left knee worsening shooting pain.would like referral to Orthopedic.Knee pain affecting her walking.has fall multiple times in her apartment due to knee giving out.  Using scooter - she states unable to walk long distance due to left chronic knee pain due to arthritis.Recently purchased own scooter but request script for insurance reimbursement.     Needs letter written to housing apartment to show that she buys medication OTC: Tylenol,Vit D,Mucinex,loratadine,Vit C,Zinc and ASA.      Past Medical History:  Diagnosis Date  . Allergy   . Anxiety   . Arthritis    "99% of my body" (11/11/2016)  . Asthma   . Bursitis of both hips   . Cataract   . Cervical scoliosis   . Chronic back pain    "all over my back" (11/11/2016)  . Depressive disorder, not elsewhere classified   . Enthesopathy of hip region   . Environmental allergies    "I take Claritin qd; 365 days/year" (11/11/2016)  . Essential hypertension, benign   . Fibromyalgia   . Frequent falls   . GERD (gastroesophageal reflux disease)   . Headache    "at least 1/wk; may last for 2 days or so" (11/11/2016)  . History of blood transfusion 1985   w/hysterectomy  .  Lumbar stenosis   . Migraine    "a few/month" (11/11/2016)  . Muscle weakness (generalized)   . Myalgia and myositis, unspecified   . Osteoporosis   . Other abnormal blood chemistry   . Other malaise and fatigue   . Raynaud's syndrome   . Sciatic nerve pain, left   . Sciatica   . Unspecified vitamin D deficiency    Past Surgical History:  Procedure Laterality Date  . ABDOMINAL HYSTERECTOMY  1985  . [place]; 1979  . SHOULDER OPEN ROTATOR CUFF REPAIR Left     No Known Allergies  Allergies as of 12/03/2018   No Known Allergies     Medication List       Accurate as of December 03, 2018  3:59 PM. If you have any questions, ask your nurse or doctor.        acetaminophen 500 MG tablet Commonly known as:  TYLENOL Take 500 mg by mouth every 6 (six) hours as needed for moderate pain or fever.   albuterol 108 (90 Base) MCG/ACT inhaler Commonly known as:  Ventolin HFA INHALE 2 PUFFS  EVERY 6 (SIX) HOURS AS NEEDED FOR WHEEZING OR SHORTNESS OF BREATH.   amitriptyline 100 MG tablet Commonly known as:  ELAVIL Take 1 tablet (100 mg total) by mouth at bedtime. For mood   antiseptic oral rinse Liqd 15 mLs by Mouth Rinse route as needed for dry mouth.   ascorbic acid 500 MG tablet Commonly known as:  VITAMIN C Take 1 tablet (500 mg total) by mouth 2 (two) times daily.   aspirin EC 81 MG tablet Take 81 mg by mouth daily.   carboxymethylcellulose 0.5 % Soln Commonly known as:  REFRESH PLUS Place 2 drops into both eyes 2 (two) times daily as needed (dry eyes).   citalopram 20 MG tablet Commonly known as:  CELEXA Take 1 tablet (20 mg total) by mouth daily.   famotidine 20 MG tablet Commonly known as:  PEPCID Take 1 tablet (20 mg total) by mouth at bedtime.   fluticasone 50 MCG/ACT nasal spray Commonly known as:  FLONASE Place 2 sprays into both nostrils daily as needed for allergies or rhinitis.   furosemide 40 MG tablet Commonly known as:  LASIX TAKE 1 TABLET BY MOUTH EVERY DAY   gabapentin 400 MG capsule Commonly known as:  NEURONTIN TAKE 3 CAPSULES TWICE DAILY  AND TAKE 2 CAPSULES AT BEDTIME  FOR  NEUROPATHY   guaiFENesin 600 MG 12 hr tablet Commonly known as:  MUCINEX Take 2 tablets (1,200 mg total) by mouth 2 (two) times daily.   loratadine 10 MG tablet Commonly known as:  CLARITIN Take 10 mg by mouth daily.   methocarbamol 500 MG tablet Commonly known as:   ROBAXIN Take 1 tablet (500 mg total) by mouth 2 (two) times daily. For muscle spasm   metoprolol tartrate 50 MG tablet Commonly known as:  LOPRESSOR TAKE ONE TABLET TWICE DAILY FOR BLOOD PRESSURE   oxyCODONE 15 MG immediate release tablet Commonly known as:  Roxicodone Take 1 tablet (15 mg total) by mouth every 4 (four) hours as needed for pain.   Vitamin D3 50 MCG (2000 UT) Tabs Take 2,000 Units by mouth daily.   zinc sulfate 220 (50 Zn) MG capsule Take 1 capsule (220 mg total) by mouth daily.       Review of Systems  Constitutional: Negative for activity change, appetite change, chills, fatigue, fever and unexpected weight change.  HENT: Negative  for hearing loss, rhinorrhea, sinus pressure, sinus pain, sneezing and trouble swallowing.        Chronic nasal allergies uses Flonase spray   Eyes: Negative for discharge, itching and visual disturbance.  Respiratory: Negative for cough, chest tightness, shortness of breath and wheezing.   Cardiovascular: Negative for chest pain, palpitations and leg swelling.  Gastrointestinal: Negative for abdominal distention, abdominal pain, constipation, diarrhea, nausea and vomiting.  Endocrine: Negative for cold intolerance, heat intolerance, polydipsia, polyphagia and polyuria.  Genitourinary: Negative for difficulty urinating, dysuria, flank pain, frequency and urgency.  Musculoskeletal: Positive for arthralgias, gait problem and myalgias.       Worsening left knee pain hx osteoarthritis would like to see the specialitist.limiting her walking.   Skin: Negative for color change, pallor and rash.  Neurological: Negative for dizziness, light-headedness and headaches.  Hematological: Does not bruise/bleed easily.  Psychiatric/Behavioral: Negative for agitation, confusion and sleep disturbance. The patient is not nervous/anxious.     Immunization History  Administered Date(s) Administered  . Influenza,inj,Quad PF,6+ Mos 05/23/2015, 05/28/2016,  03/24/2018  . Influenza-Unspecified 07/07/2012, 02/28/2014  . Pneumococcal Conjugate-13 07/14/2014  . Pneumococcal Polysaccharide-23 09/11/2017  . Tdap 01/26/2016   Pertinent  Health Maintenance Due  Topic Date Due  . COLONOSCOPY  10/18/2001  . INFLUENZA VACCINE  01/29/2019  . MAMMOGRAM  09/04/2019  . DEXA SCAN  Completed  . PNA vac Low Risk Adult  Completed   Fall Risk  12/03/2018 10/26/2018 10/04/2018 09/28/2018 09/03/2018  Falls in the past year? 1 1 0 1 1  Number falls in past yr: 1 1 0 1 1  Comment - - - - -  Injury with Fall? 0 1 0 0 0  Comment - - - - -  Risk Factor Category  - - - - -  Risk for fall due to : - - - - Impaired mobility;Medication side effect;History of fall(s);Impaired balance/gait  Follow up - - - - Falls prevention discussed   There were no vitals filed for this visit. There is no height or weight on file to calculate BMI. Physical Exam  Unable to complete on Telephone visit.   Labs reviewed: Recent Labs    10/10/18 0402 10/11/18 0344 10/12/18 0402 10/28/18 10/28/18 1330  NA 138 135 136 137 137  K 4.0 3.6 4.1 4.9 4.9  CL 98 96* 99  --  99  CO2 _0 --  19*  GLUCOSE 89 90 87  --  83  BUN _1 CREATININE 0.72 0.87 0.89 1.3* 1.26*  CALCIUM 9.1 8.9 9.0  --  9.3  MG 2.2 2.2 2.1  --   --    Recent Labs    10/10/18 0402 10/11/18 0344 10/28/18 10/28/18 1330  AST 21 19 11* 11  ALT _2 ALKPHOS 78 71 61 61  BILITOT 0.5 0.7  --  0.6  PROT 7.5 7.5  --  7.0  ALBUMIN 3.0* 3.2*  --  4.0   Recent Labs    10/10/18 0402 10/11/18 0344 10/28/18 10/28/18 1330  WBC 8.0 8.6 5.3 5.3  NEUTROABS 3.5 4.0 2 1.9  HGB 11.9* 12.1 11.8* 11.8  HCT 37.7 37.8 35* 35.0  MCV 88.5 88.9  --  81  PLT 371 428* 422* 422   Lab Results  Component Value Date   TSH 2.96 09/03/2018   Lab Results  Component Value Date   HGBA1C 5.4 09/03/2018   Lab Results  Component Value Date  CHOL 189 09/03/2018   HDL 58 09/03/2018   LDLCALC 112 (H)  09/03/2018   TRIG 88 09/03/2018   CHOLHDL 3.3 09/03/2018    Significant Diagnostic Results in last 30 days:  No results found.  Assessment/Plan 1. Essential hypertension No log available for review.Denied any symptoms of hypotension.continue on metoprolol 50 mg tablet twice daily and furosemide 40 mg tablet daily.on ASA for cardioproctective effect.  - CBC with Differential/Platelet; Future - CMP with eGFR([facility]); Future - TSH; Future - Lipid panel; Future  2. Chronic diastolic congestive heart failure ([place]) Reports no signs of fluid overload.continue on metoprolol 50 mg tablet twice daily and furosemide 40 mg tablet daily.continue to monitor weight.  3. Fibromyalgia Current regimen effective.continue to monitor.  4. Unsteady gait Has hx of falls.completed Therapy post recent hospital admission for COVID-19.has purchased a scooter to use for long distance.Script written to be mailed to patient.recommend Physical Therapy for device training.Patient states will contact home health Agency PT since she recently completed therapy.She will notify provider's office once she talks with Therapy.     5. Prediabetes Lab Results  Component Value Date   HGBA1C 5.4 09/03/2018   Encouraged to cut down on concentrated sweets in diet and snacks. - Hemoglobin A1c; Future  6. Gastroesophageal reflux disease, esophagitis presence not specified Famotidine ordered in previous visit but didn't pick up.Encouraged to take Famotidine 20 mg tablet one by mouth daily.   7. Mild intermittent asthma, unspecified whether complicated Symptoms control.continue on Albuterol inhaler.   8. chronic left knee pain   left knee worsening shooting pain.hx osteoarthritis.Knee pain affecting her walking.has fall multiple times in her apartment due to knee giving out.Refer to Orthopedic for evaluation.   Family/ staff Communication: Reviewed plan of care with patient.   Labs/tests ordered:  - CBC with  Differential/Platelet; Future - CMP with eGFR([facility]); Future - TSH; Future - Lipid panel; Future - Hemoglobin A1c; Future  Time spent with patient 27 minutes >50% time spent counseling; reviewing medical record; tests; labs; and developing future plan of care.  Sandrea Hughs, NP  Addendum: 12/07/2018  Called patient to follow up need Physical Therapy to to train on how to use her new scooter that she recently purchased by herself.Patient states will call agency then let provider known.patient glad that I called states has been having some chest pain does not know whether to call 9-1-1 or not.I've encouraged her to call 911 to be evaluated in the ED as soon as possible.

## 2018-12-03 NOTE — Progress Notes (Signed)
Patient ID: Melinda [name], female   DOB: August 14, 1950, 68 y.o.   MRN: [id number] This service is provided via telemedicine  No vital signs collected/recorded due to the encounter was a telemedicine visit.   Location of patient (ex: home, work):  home  Patient consents to a telephone visit:  yes    Location of the provider (ex: office, home):  OFFICE  Name of any referring provider:  Richarda Blade, NP  Names of all persons participating in the telemedicine service and their role in the encounter:  PATIENT, DESHANNON SMITH CMA, [facility] [name], NP   Time spent on call:  11:02

## 2018-12-20 ENCOUNTER — Telehealth: Payer: Self-pay | Admitting: *Deleted

## 2018-12-20 DIAGNOSIS — F332 Major depressive disorder, recurrent severe without psychotic features: Secondary | ICD-10-CM

## 2018-12-20 MED ORDER — CITALOPRAM HYDROBROMIDE 20 MG PO TABS: 20.0000 mg | ORAL_TABLET | Freq: Every day | ORAL | 0 refills | Status: DC

## 2018-12-20 NOTE — Telephone Encounter (Signed)
May refill Citalopram 20 mg tablet one by mouth daily # 90 day per patient's request.

## 2018-12-20 NOTE — Telephone Encounter (Signed)
Patient notified . Rx faxed

## 2018-12-20 NOTE — Telephone Encounter (Signed)
Patient called and stated that she needs another Rx called in for a 90 day supply to [facility] for Citalapram. Stated that she just received a 90 day supply from [facility] but she knocked it off of the counter and it fell into the toilet. No pills saved. Stated that she will pay for it out of pocket. Please Advise.

## 2018-12-21 ENCOUNTER — Ambulatory Visit: Payer: Medicare HMO | Admitting: Family

## 2018-12-23 ENCOUNTER — Other Ambulatory Visit: Payer: Self-pay

## 2018-12-23 DIAGNOSIS — M5430 Sciatica, unspecified side: Secondary | ICD-10-CM

## 2018-12-23 DIAGNOSIS — M797 Fibromyalgia: Secondary | ICD-10-CM

## 2018-12-23 DIAGNOSIS — G609 Hereditary and idiopathic neuropathy, unspecified: Secondary | ICD-10-CM

## 2018-12-23 DIAGNOSIS — G894 Chronic pain syndrome: Secondary | ICD-10-CM

## 2018-12-23 DIAGNOSIS — M4800 Spinal stenosis, site unspecified: Secondary | ICD-10-CM

## 2018-12-23 MED ORDER — OXYCODONE HCL 15 MG PO TABS: 15.0000 mg | ORAL_TABLET | ORAL | 0 refills | Status: DC | PRN

## 2018-12-23 NOTE — Telephone Encounter (Signed)
Per Rodena Piety this medication has been checked on data base and can be refilled today.   [facility]

## 2019-01-21 ENCOUNTER — Other Ambulatory Visit: Payer: Self-pay | Admitting: *Deleted

## 2019-01-21 DIAGNOSIS — M797 Fibromyalgia: Secondary | ICD-10-CM

## 2019-01-21 DIAGNOSIS — G894 Chronic pain syndrome: Secondary | ICD-10-CM

## 2019-01-21 DIAGNOSIS — G609 Hereditary and idiopathic neuropathy, unspecified: Secondary | ICD-10-CM

## 2019-01-21 DIAGNOSIS — M4800 Spinal stenosis, site unspecified: Secondary | ICD-10-CM

## 2019-01-21 DIAGNOSIS — M5430 Sciatica, unspecified side: Secondary | ICD-10-CM

## 2019-01-21 MED ORDER — OXYCODONE HCL 15 MG PO TABS: 15.0000 mg | ORAL_TABLET | ORAL | 0 refills | Status: DC | PRN

## 2019-01-21 NOTE — Telephone Encounter (Signed)
Patient requested refill Epic LR: 12/23/2018 Pended Rx and sent to [facility] for approval.

## 2019-02-07 DIAGNOSIS — H2513 Age-related nuclear cataract, bilateral: Secondary | ICD-10-CM | POA: Diagnosis not present

## 2019-02-07 DIAGNOSIS — H04123 Dry eye syndrome of bilateral lacrimal glands: Secondary | ICD-10-CM | POA: Diagnosis not present

## 2019-02-09 ENCOUNTER — Telehealth: Payer: Self-pay | Admitting: *Deleted

## 2019-02-09 NOTE — Telephone Encounter (Signed)
Received fax from [employer] requesting application for a [employer].   I called patient and left message on VM to return call. Reason for call: Patient has a [place] address but requesting a [employer].

## 2019-02-10 ENCOUNTER — Other Ambulatory Visit: Payer: Self-pay | Admitting: Family

## 2019-02-10 DIAGNOSIS — F332 Major depressive disorder, recurrent severe without psychotic features: Secondary | ICD-10-CM

## 2019-02-10 DIAGNOSIS — G609 Hereditary and idiopathic neuropathy, unspecified: Secondary | ICD-10-CM

## 2019-02-10 NOTE — Telephone Encounter (Signed)
Patient may use the [place] if travelling to [place].Provider only licensed in [place] unable to fill [employer].

## 2019-02-10 NOTE — Telephone Encounter (Signed)
Patient stated that she kept her [employer] and she floats between [place] and [place] and that's why she needed the [employer].   Placed Placard in [name]'s folder to review.  Patient wants it faxed back to [facility] Fax: [phone]

## 2019-02-11 ENCOUNTER — Other Ambulatory Visit: Payer: Self-pay | Admitting: *Deleted

## 2019-02-11 DIAGNOSIS — I10 Essential (primary) hypertension: Secondary | ICD-10-CM

## 2019-02-11 MED ORDER — METOPROLOL TARTRATE 50 MG PO TABS: ORAL_TABLET | ORAL | 1 refills | Status: DC

## 2019-02-11 NOTE — Telephone Encounter (Signed)
[facility] 

## 2019-02-11 NOTE — Telephone Encounter (Signed)
Patient notified. Wants [place] [employer] Placard.

## 2019-02-18 ENCOUNTER — Other Ambulatory Visit: Payer: Self-pay | Admitting: Family

## 2019-02-22 ENCOUNTER — Other Ambulatory Visit: Payer: Self-pay | Admitting: *Deleted

## 2019-02-22 DIAGNOSIS — M4800 Spinal stenosis, site unspecified: Secondary | ICD-10-CM

## 2019-02-22 DIAGNOSIS — J302 Other seasonal allergic rhinitis: Secondary | ICD-10-CM

## 2019-02-22 DIAGNOSIS — M797 Fibromyalgia: Secondary | ICD-10-CM

## 2019-02-22 DIAGNOSIS — G609 Hereditary and idiopathic neuropathy, unspecified: Secondary | ICD-10-CM

## 2019-02-22 DIAGNOSIS — M5430 Sciatica, unspecified side: Secondary | ICD-10-CM

## 2019-02-22 DIAGNOSIS — G894 Chronic pain syndrome: Secondary | ICD-10-CM

## 2019-02-22 MED ORDER — FLUTICASONE PROPIONATE 50 MCG/ACT NA SUSP: 2.0000 | Freq: Every day | NASAL | 1 refills | Status: DC | PRN

## 2019-02-22 MED ORDER — OXYCODONE HCL 15 MG PO TABS: 15.0000 mg | ORAL_TABLET | ORAL | 0 refills | Status: DC | PRN

## 2019-02-22 NOTE — Telephone Encounter (Signed)
Patient requested mail order

## 2019-02-22 NOTE — Telephone Encounter (Signed)
Patient requested Rx [place] Verified LR: 01/23/2019 Pended Rx and sent to [facility] for approval.

## 2019-02-24 ENCOUNTER — Telehealth: Payer: Self-pay | Admitting: *Deleted

## 2019-02-24 NOTE — Telephone Encounter (Signed)
Patient should not be taking every 4 hours since medication is as needed.Will need referral to pain management if she is needing it every 4 hours.

## 2019-02-24 NOTE — Telephone Encounter (Signed)
Patient called and stated that she picked up her Oxycodone and it was only for #120 and she usually gets #180. Patient is to know why it was decreased by 60 tablets. Patient stated that she takes it every 4 hours.  Please Advise.

## 2019-02-24 NOTE — Telephone Encounter (Signed)
Okay.  Thanks.

## 2019-02-24 NOTE — Telephone Encounter (Signed)
Patient notified. Stated that she takes it as needed. Does not need referral.

## 2019-02-28 ENCOUNTER — Telehealth: Payer: Self-pay | Admitting: *Deleted

## 2019-02-28 NOTE — Telephone Encounter (Signed)
Patient dropped off Handicap Placard form to have filled out for [place] Placard. Please call patient once completed.   Placed form in [name]'s folder to review.

## 2019-03-01 ENCOUNTER — Other Ambulatory Visit: Payer: Medicare HMO

## 2019-03-02 NOTE — Telephone Encounter (Signed)
Handicap Placard ready. Called patient and she will have daughter pick up from office. Placed in drawer up front for pick up. Copy sent for scanning.

## 2019-03-04 ENCOUNTER — Ambulatory Visit: Payer: Medicare HMO | Admitting: Family

## 2019-03-15 ENCOUNTER — Other Ambulatory Visit: Payer: Medicare HMO

## 2019-03-18 ENCOUNTER — Ambulatory Visit: Payer: Medicare HMO | Admitting: Family

## 2019-03-18 ENCOUNTER — Other Ambulatory Visit: Payer: Self-pay | Admitting: Family

## 2019-03-18 DIAGNOSIS — F332 Major depressive disorder, recurrent severe without psychotic features: Secondary | ICD-10-CM

## 2019-03-23 ENCOUNTER — Other Ambulatory Visit: Payer: Self-pay

## 2019-03-23 DIAGNOSIS — M4800 Spinal stenosis, site unspecified: Secondary | ICD-10-CM

## 2019-03-23 DIAGNOSIS — M797 Fibromyalgia: Secondary | ICD-10-CM

## 2019-03-23 DIAGNOSIS — G609 Hereditary and idiopathic neuropathy, unspecified: Secondary | ICD-10-CM

## 2019-03-23 DIAGNOSIS — G894 Chronic pain syndrome: Secondary | ICD-10-CM

## 2019-03-23 DIAGNOSIS — M5430 Sciatica, unspecified side: Secondary | ICD-10-CM

## 2019-03-23 MED ORDER — OXYCODONE HCL 15 MG PO TABS: 15.0000 mg | ORAL_TABLET | ORAL | 0 refills | Status: DC | PRN

## 2019-03-23 NOTE — Telephone Encounter (Signed)
Patient called to request an appointment for next week and to ask for a refill on her oxycodone. I made the appointment for next Tuesday, and have sent request for refill to provider.

## 2019-03-29 ENCOUNTER — Ambulatory Visit (INDEPENDENT_AMBULATORY_CARE_PROVIDER_SITE_OTHER): Payer: Medicare HMO | Admitting: Family

## 2019-03-29 ENCOUNTER — Other Ambulatory Visit: Payer: Self-pay

## 2019-03-29 ENCOUNTER — Encounter: Payer: Self-pay | Admitting: Family

## 2019-03-29 VITALS — BP 98/70 | HR 69 | Temp 97.1°F | Ht 64.0 in | Wt 236.2 lb

## 2019-03-29 DIAGNOSIS — G43009 Migraine without aura, not intractable, without status migrainosus: Secondary | ICD-10-CM | POA: Diagnosis not present

## 2019-03-29 DIAGNOSIS — Z23 Encounter for immunization: Secondary | ICD-10-CM

## 2019-03-29 DIAGNOSIS — I10 Essential (primary) hypertension: Secondary | ICD-10-CM

## 2019-03-29 DIAGNOSIS — I5032 Chronic diastolic (congestive) heart failure: Secondary | ICD-10-CM

## 2019-03-29 DIAGNOSIS — F332 Major depressive disorder, recurrent severe without psychotic features: Secondary | ICD-10-CM

## 2019-03-29 DIAGNOSIS — R7303 Prediabetes: Secondary | ICD-10-CM | POA: Diagnosis not present

## 2019-03-29 DIAGNOSIS — G609 Hereditary and idiopathic neuropathy, unspecified: Secondary | ICD-10-CM

## 2019-03-29 DIAGNOSIS — G4700 Insomnia, unspecified: Secondary | ICD-10-CM

## 2019-03-29 DIAGNOSIS — M797 Fibromyalgia: Secondary | ICD-10-CM

## 2019-03-29 MED ORDER — SUMATRIPTAN SUCCINATE 25 MG PO TABS: 25.0000 mg | ORAL_TABLET | ORAL | 0 refills | Status: DC | PRN

## 2019-03-29 MED ORDER — AMITRIPTYLINE HCL 150 MG PO TABS: 150.0000 mg | ORAL_TABLET | Freq: Every day | ORAL | 3 refills | Status: DC

## 2019-03-29 MED ORDER — ZINC SULFATE 220 (50 ZN) MG PO CAPS: 220.0000 mg | ORAL_CAPSULE | Freq: Every day | ORAL | 1 refills | Status: DC

## 2019-03-29 NOTE — Progress Notes (Signed)
Provider: Marlowe Sax FNP-C   [name], Nelda Bucks, NP  Patient Care Team: [name], Nelda Bucks, NP as PCP - General (Family Medicine)  Extended Emergency Contact Information Primary Emergency Contact: [place]ie [place] Mobile Phone: [phone] Relation: Son Secondary Emergency Contact: Amparo [place] Mobile Phone: [phone] Relation: Daughter  Code Status: Full Code  Goals of care: Advanced Directive information Advanced Directives 10/06/2018  Does Patient Have a Medical Advance Directive? Yes  Type of Advance Directive [place]  Does patient want to make changes to medical advance directive? No - Patient declined  Copy of [place] in Chart? No - copy requested  Would patient like information on creating a medical advance directive? -     Chief Complaint  Patient presents with  . Acute Visit    Patient c/o of pain in legs and hard time standing, knots coming up on hands and feet, and patient wakes up to right side of face swolen   . Quality Metric Gaps    she would like to have a flu shot     HPI:  Pt is a 68 y.o. female seen today for medical management of chronic diseases.she has a medical history of Hypertension,congestive heart failure,Fibromylagia,severe depression,insomnia,prediabetes,Peripheral Neuropathy,unsteady gait,falls among other conditions.Patient c/o of pain in legs and hard time standing.Worsening genralized pain.states using scooter to get around and Rolator.she states visited her son and daughter in [place] in August had x 6 episodes of fall while there.No injures. She has had  "Knots" coming up on hands and leg but goes away.No redness or tenderness.states knots are usually big but goes away.Also feels like she wakes up with  right side of face swollen.  Depression - symptoms have worsen.lives alone but goes to visit her children in [place] on  amitriptyline 100 mg tablet daily and Celexa 20 mg tablet daily.she denies any thoughts of harming self or others.  Migraine headache - pain shots from the back of the head to the top,front and behind her eyes.she states sensitive to lights.shedenies any nausea or vomiting or aura.  Congestive Heart Failure - she denies any cough,shortness of breath or wheezing.Has had six pounds weight gain over three months.shedenies any worsening edema.on furosemide 40 mg tablet daily and metoprolol 50 mg Tablet twice daily.   Peripheral Neuropathy - states burning,numbness and tingling to feet that runs up to her head.currently on Gabapentin 400 mg capsule three capsule twice daily and two capsule at bedtime.   She would like to have a flu shot.she was in the hospital 10/06/2018 treated for COVID-19 but states has recovered well.      Past Medical History:  Diagnosis Date  . Allergy   . Anxiety   . Arthritis    "99% of my body" (11/11/2016)  . Asthma   . Bursitis of both hips   . Cataract   . Cervical scoliosis   . Chronic back pain    "all over my back" (11/11/2016)  . Depressive disorder, not elsewhere classified   . Enthesopathy of hip region   . Environmental allergies    "I take Claritin qd; 365 days/year" (11/11/2016)  . Essential hypertension, benign   . Fibromyalgia   . Frequent falls   . GERD (gastroesophageal reflux disease)   . Headache    "at least 1/wk; may last for 2 days or so" (11/11/2016)  . History of blood transfusion 1985   w/hysterectomy  . Lumbar stenosis   .  Migraine    "a few/month" (11/11/2016)  . Muscle weakness (generalized)   . Myalgia and myositis, unspecified   . Osteoporosis   . Other abnormal blood chemistry   . Other malaise and fatigue   . Raynaud's syndrome   . Sciatic nerve pain, left   . Sciatica   . Unspecified vitamin D deficiency    Past Surgical History:  Procedure Laterality Date  . ABDOMINAL HYSTERECTOMY  1985  . [place]; 1979   . SHOULDER OPEN ROTATOR CUFF REPAIR Left     No Known Allergies  Allergies as of 03/29/2019   No Known Allergies     Medication List       Accurate as of March 29, 2019 10:06 AM. If you have any questions, ask your nurse or doctor.        STOP taking these medications   methocarbamol 500 MG tablet Commonly known as: ROBAXIN Stopped by: Sandrea Hughs, NP   zinc sulfate 220 (50 Zn) MG capsule Stopped by: Sandrea Hughs, NP     TAKE these medications   acetaminophen 500 MG tablet Commonly known as: TYLENOL Take 500 mg by mouth every 6 (six) hours as needed for moderate pain or fever.   albuterol 108 (90 Base) MCG/ACT inhaler Commonly known as: Ventolin HFA INHALE 2 PUFFS  EVERY 6 (SIX) HOURS AS NEEDED FOR WHEEZING OR SHORTNESS OF BREATH.   amitriptyline 150 MG tablet Commonly known as: ELAVIL Take 1 tablet (150 mg total) by mouth at bedtime. For mood What changed:   medication strength  how much to take Changed by: Sandrea Hughs, NP   antiseptic oral rinse Liqd 15 mLs by Mouth Rinse route as needed for dry mouth.   ascorbic acid 500 MG tablet Commonly known as: VITAMIN C Take 1 tablet (500 mg total) by mouth 2 (two) times daily.   aspirin EC 81 MG tablet Take 81 mg by mouth daily.   carboxymethylcellulose 0.5 % Soln Commonly known as: REFRESH PLUS Place 2 drops into both eyes 2 (two) times daily as needed (dry eyes).   citalopram 20 MG tablet Commonly known as: CELEXA TAKE 1 TABLET BY MOUTH EVERY DAY   famotidine 20 MG tablet Commonly known as: PEPCID Take 1 tablet (20 mg total) by mouth at bedtime.   fluticasone 50 MCG/ACT nasal spray Commonly known as: FLONASE Place 2 sprays into both nostrils daily as needed for allergies or rhinitis.   furosemide 40 MG tablet Commonly known as: LASIX TAKE 1 TABLET BY MOUTH EVERY DAY   gabapentin 400 MG capsule Commonly known as: NEURONTIN TAKE 3 CAPSULES TWICE DAILY  AND TAKE 2 CAPSULES AT BEDTIME   FOR  NEUROPATHY   guaiFENesin 600 MG 12 hr tablet Commonly known as: MUCINEX Take 2 tablets (1,200 mg total) by mouth 2 (two) times daily.   loratadine 10 MG tablet Commonly known as: CLARITIN Take 10 mg by mouth daily.   metoprolol tartrate 50 MG tablet Commonly known as: LOPRESSOR Take one tablet by mouth twice daily for blood pressure   oxyCODONE 15 MG immediate release tablet Commonly known as: ROXICODONE Take 15 mg by mouth every 6 (six) hours as needed for pain. What changed: Another medication with the same name was removed. Continue taking this medication, and follow the directions you see here. Changed by: Sandrea Hughs, NP   SUMAtriptan 25 MG tablet Commonly known as: Imitrex Take 1 tablet (25 mg total) by mouth every 2 (two)  hours as needed for migraine or headache. May repeat in 2 hours if headache persists or recurs. Started by: Sandrea Hughs, NP   vitamin B-12 500 MCG tablet Commonly known as: CYANOCOBALAMIN Take 500 mcg by mouth daily.   Vitamin D3 50 MCG (2000 UT) Tabs Take 2,000 Units by mouth daily.       Review of Systems  Constitutional: Negative for appetite change, chills, fatigue and fever.  HENT: Negative for congestion, postnasal drip, rhinorrhea, sinus pain, sneezing and sore throat.   Eyes: Positive for visual disturbance. Negative for discharge, redness and itching.       Follow up with Ophthalmology wears eye glasses  Respiratory: Negative for cough, chest tightness, shortness of breath and wheezing.   Cardiovascular: Negative for chest pain, palpitations and leg swelling.  Gastrointestinal: Negative for abdominal distention, abdominal pain, constipation, diarrhea, nausea and vomiting.  Endocrine: Negative for cold intolerance, heat intolerance, polydipsia, polyphagia and polyuria.  Genitourinary: Negative for difficulty urinating, dysuria, flank pain, frequency and urgency.  Musculoskeletal: Positive for arthralgias, back pain and gait  problem.       Chronic pain continues to require pain medication every 4 hours. Uses scooter at home.Here with a Rolator Reports x 6 fall episode while visiting son and daughter in [place].   Skin: Negative for color change, pallor and rash.       Intermittent " nodes" that comes on and off on the legs and arms at times.No nodes today.  Neurological: Positive for numbness and headaches. Negative for dizziness and light-headedness.       Hx neuropathy on the legs Headaches -like a migraine throbbing from the back of the head,top to the front and the eyes hurt too.    Hematological: Does not bruise/bleed easily.  Psychiatric/Behavioral: Negative for agitation, confusion and sleep disturbance. The patient is not nervous/anxious.        Worsening memory     Immunization History  Administered Date(s) Administered  . Fluad Quad(high Dose 65+) 03/29/2019  . Influenza,inj,Quad PF,6+ Mos 05/23/2015, 05/28/2016, 03/24/2018  . Influenza-Unspecified 07/07/2012, 02/28/2014  . Pneumococcal Conjugate-13 07/14/2014  . Pneumococcal Polysaccharide-23 09/11/2017  . Tdap 01/26/2016   Pertinent  Health Maintenance Due  Topic Date Due  . COLONOSCOPY  10/18/2001  . INFLUENZA VACCINE  01/29/2019  . MAMMOGRAM  09/04/2019  . DEXA SCAN  Completed  . PNA vac Low Risk Adult  Completed   Fall Risk  03/29/2019 12/03/2018 10/26/2018 10/04/2018 09/28/2018  Falls in the past year? 1 1 1  0 1  Number falls in past yr: 1 1 1  0 1  Comment - - - - -  Injury with Fall? 0 0 1 0 0  Comment - - - - -  Risk Factor Category  - - - - -  Risk for fall due to : - - - - -  Follow up - - - - -    Vitals:   03/29/19 0917  BP: 98/70  Pulse: 69  Temp: (!) 97.1 F (36.2 C)  TempSrc: Temporal  SpO2: 92%  Weight: 236 lb 3.2 oz (107.1 kg)  Height: 5' 4"  (1.626 m)   Body mass index is 40.54 kg/m. Physical Exam Constitutional:      General: She is not in acute distress.    Appearance: She is obese. She is not  ill-appearing.  HENT:     Head: Normocephalic.     Right Ear: Tympanic membrane, ear canal and external ear normal. There is no impacted  cerumen.     Left Ear: Tympanic membrane, ear canal and external ear normal. There is no impacted cerumen.     Nose: Nose normal. No congestion or rhinorrhea.     Mouth/Throat:     Mouth: Mucous membranes are moist.     Pharynx: Oropharynx is clear. No oropharyngeal exudate or posterior oropharyngeal erythema.  Eyes:     General: No scleral icterus.       Right eye: No discharge.        Left eye: No discharge.     Extraocular Movements: Extraocular movements intact.     Conjunctiva/sclera: Conjunctivae normal.     Pupils: Pupils are equal, round, and reactive to light.  Neck:     Musculoskeletal: Normal range of motion. No neck rigidity or muscular tenderness.     Vascular: No carotid bruit.  Cardiovascular:     Rate and Rhythm: Normal rate and regular rhythm.     Pulses: Normal pulses.     Heart sounds: Normal heart sounds. No murmur. No friction rub. No gallop.   Pulmonary:     Effort: Pulmonary effort is normal. No respiratory distress.     Breath sounds: Normal breath sounds. No wheezing, rhonchi or rales.  Chest:     Chest wall: No tenderness.  Abdominal:     General: Bowel sounds are normal. There is no distension.     Palpations: Abdomen is soft. There is no mass.     Tenderness: There is no abdominal tenderness. There is no right CVA tenderness, left CVA tenderness, guarding or rebound.  Musculoskeletal:        General: No swelling or tenderness.     Right lower leg: No edema.     Left lower leg: No edema.     Comments: Unsteady gait ambulates with Rolator   Lymphadenopathy:     Cervical: No cervical adenopathy.  Skin:    General: Skin is warm and dry.     Coloration: Skin is not pale.     Findings: No bruising, erythema, lesion or rash.     Comments: No nodes noted on areas patient reported -left leg and right wrist area.    Neurological:     Mental Status: She is alert. Mental status is at baseline.     Cranial Nerves: No cranial nerve deficit.     Sensory: No sensory deficit.     Motor: No weakness.     Coordination: Coordination normal.     Gait: Gait normal.     Deep Tendon Reflexes: Reflexes normal.  Psychiatric:        Mood and Affect: Mood is depressed. Mood is not anxious. Affect is labile and inappropriate.        Speech: Speech normal.        Behavior: Behavior normal.        Thought Content: Thought content normal.        Cognition and Memory: She exhibits impaired recent memory.        Judgment: Judgment normal.     Comments: Patient laughs, joking with provider  at times and talking well next minutes she states in a lot pain.    Labs reviewed: Recent Labs    10/10/18 0402 10/11/18 0344 10/12/18 0402 10/28/18 10/28/18 1330  NA 138 135 136 137 137  K 4.0 3.6 4.1 4.9 4.9  CL 98 96* 99  --  99  CO2 26 25 24   --  19*  GLUCOSE 89 90 87  --  83  BUN 14 16 13 15 15   CREATININE 0.72 0.87 0.89 1.3* 1.26*  CALCIUM 9.1 8.9 9.0  --  9.3  MG 2.2 2.2 2.1  --   --    Recent Labs    10/10/18 0402 10/11/18 0344 10/28/18 10/28/18 1330  AST 21 19 11* 11  ALT 14 14 9 9   ALKPHOS 78 71 61 61  BILITOT 0.5 0.7  --  0.6  PROT 7.5 7.5  --  7.0  ALBUMIN 3.0* 3.2*  --  4.0   Recent Labs    10/10/18 0402 10/11/18 0344 10/28/18 10/28/18 1330  WBC 8.0 8.6 5.3 5.3  NEUTROABS 3.5 4.0 2 1.9  HGB 11.9* 12.1 11.8* 11.8  HCT 37.7 37.8 35* 35.0  MCV 88.5 88.9  --  81  PLT 371 428* 422* 422   Lab Results  Component Value Date   TSH 2.96 09/03/2018   Lab Results  Component Value Date   HGBA1C 5.4 09/03/2018   Lab Results  Component Value Date   CHOL 189 09/03/2018   HDL 58 09/03/2018   LDLCALC 112 (H) 09/03/2018   TRIG 88 09/03/2018   CHOLHDL 3.3 09/03/2018    Significant Diagnostic Results in last 30 days:  No results found.  Assessment/Plan 1. Need for influenza vaccination No  signs of URI's.Afebrile.  - Flu Vaccine QUAD High Dose(Fluad) administered by Ruthell Rummage CMA.  2. Essential hypertension B/p controlled.continue on furosemide 40 mg tablet daily and metoprolol 50 mg Tablet twice daily.on ASA for chest pain prophylaxis.   3. Chronic diastolic congestive heart failure (HCC) No signs of fluid overload.continue on furosemide 40 mg tablet daily and metoprolol 50 mg Tablet twice daily.  4. Fibromyalgia Worsening pain per patient.continue on oxycodone 15 mg IR tablet every 6 hours as needed for pain.will refer to pain management clinic for further evaluation.patient in agreement with referral.  - amitriptyline (ELAVIL) 150 MG tablet; Take 1 tablet (150 mg total) by mouth at bedtime. For mood  Dispense: 90 tablet; Refill: 3 - Ambulatory referral to Pain Clinic  5. Severe episode of recurrent major depressive disorder, without psychotic features ([place]) Worsening symptoms per patient.will increase her amitriptyline from m100 mg tablet to 150 mg tablet daily at bedtime.continue on Celexa 20 mg tablet daily.  - amitriptyline (ELAVIL) 150 MG tablet; Take 1 tablet (150 mg total) by mouth at bedtime. For mood  Dispense: 90 tablet; Refill: 3 - TSH; Future  6. Idiopathic peripheral neuropathy Continue on Gabapentin 400 mg capsule three capsule twice daily and two capsule at bedtime.   7. Insomnia, unspecified type Continue on amitriptyline.   8. Migraine without aura and without status migrainosus, not intractable - SUMAtriptan (IMITREX) 25 MG tablet; Take 1 tablet (25 mg total) by mouth every 2 (two) hours as needed for migraine or headache. May repeat in 2 hours if headache persists or recurs.  Dispense: 15 tablet; Refill: 0 Will refer to Neurology if Imitrex not effective.   9. Prediabetes Encouraged low carbohydrates,low saturated fats,high vegetable diet and cut down on sugary drinks/snacks.  - Hemoglobin A1c - Hemoglobin A1c; Future  Family/ staff  Communication: Reviewed plan of care with patient   Labs/tests ordered:  - Lipid panel - TSH - CMP with eGFR([facility]) - CBC with Differential/Platelet - CBC with Differential/Platelet; Future - CMP with eGFR([facility]); Future - Lipid panel; Future  Sandrea Hughs, NP

## 2019-03-30 LAB — CBC WITH DIFFERENTIAL/PLATELET
Absolute Monocytes: 929 cells/uL (ref 200–950)
Basophils Absolute: 63 cells/uL (ref 0–200)
Basophils Relative: 1.1 %
Eosinophils Absolute: 211 cells/uL (ref 15–500)
Eosinophils Relative: 3.7 %
HCT: 34.7 % — ABNORMAL LOW (ref 35.0–45.0)
Hemoglobin: 11.3 g/dL — ABNORMAL LOW (ref 11.7–15.5)
Lymphs Abs: 2006 cells/uL (ref 850–3900)
MCH: 27.8 pg (ref 27.0–33.0)
MCHC: 32.6 g/dL (ref 32.0–36.0)
MCV: 85.3 fL (ref 80.0–100.0)
MPV: 11.9 fL (ref 7.5–12.5)
Monocytes Relative: 16.3 %
Neutro Abs: 2491 cells/uL (ref 1500–7800)
Neutrophils Relative %: 43.7 %
Platelets: 334 10*3/uL (ref 140–400)
RBC: 4.07 10*6/uL (ref 3.80–5.10)
RDW: 12.4 % (ref 11.0–15.0)
Total Lymphocyte: 35.2 %
WBC: 5.7 10*3/uL (ref 3.8–10.8)

## 2019-03-30 LAB — HEMOGLOBIN A1C
Hgb A1c MFr Bld: 5.3 % of total Hgb (ref <5.7)
Mean Plasma Glucose: 105 (calc)
eAG (mmol/L): 5.8 (calc)

## 2019-03-30 LAB — LIPID PANEL
Cholesterol: 182 mg/dL (ref <200)
HDL: 62 mg/dL (ref >=50)
LDL Cholesterol (Calc): 102 mg/dL (calc) — ABNORMAL HIGH
Non-HDL Cholesterol (Calc): 120 mg/dL (calc) (ref <130)
Total CHOL/HDL Ratio: 2.9 (calc) (ref <5.0)
Triglycerides: 89 mg/dL (ref <150)

## 2019-03-30 LAB — COMPLETE METABOLIC PANEL WITH GFR
AG Ratio: 1.6 (calc) (ref 1.0–2.5)
ALT: 6 U/L (ref 6–29)
AST: 11 U/L (ref 10–35)
Albumin: 4.2 g/dL (ref 3.6–5.1)
Alkaline phosphatase (APISO): 58 U/L (ref 37–153)
BUN/Creatinine Ratio: 14 (calc) (ref 6–22)
BUN: 19 mg/dL (ref 7–25)
CO2: 31 mmol/L (ref 20–32)
Calcium: 9.4 mg/dL (ref 8.6–10.4)
Chloride: 103 mmol/L (ref 98–110)
Creat: 1.33 mg/dL — ABNORMAL HIGH (ref 0.50–0.99)
GFR, Est African American: 48 mL/min/{1.73_m2} — ABNORMAL LOW (ref >=60)
GFR, Est Non African American: 41 mL/min/{1.73_m2} — ABNORMAL LOW (ref >=60)
Globulin: 2.6 g/dL (calc) (ref 1.9–3.7)
Glucose, Bld: 80 mg/dL (ref 65–99)
Potassium: 4 mmol/L (ref 3.5–5.3)
Sodium: 140 mmol/L (ref 135–146)
Total Bilirubin: 0.8 mg/dL (ref 0.2–1.2)
Total Protein: 6.8 g/dL (ref 6.1–8.1)

## 2019-03-30 LAB — TSH: TSH: 2.61 mIU/L (ref 0.40–4.50)

## 2019-04-13 ENCOUNTER — Other Ambulatory Visit: Payer: Self-pay

## 2019-04-13 ENCOUNTER — Ambulatory Visit (INDEPENDENT_AMBULATORY_CARE_PROVIDER_SITE_OTHER): Payer: Medicare HMO | Admitting: Family

## 2019-04-13 DIAGNOSIS — M79601 Pain in right arm: Secondary | ICD-10-CM | POA: Diagnosis not present

## 2019-04-13 NOTE — Progress Notes (Signed)
This service is provided via telemedicine  No vital signs collected/recorded due to the encounter was a telemedicine visit.   Location of patient (ex: home, work): Yes  Patient consents to a telephone visit:  Home  Location of the provider (ex: office, home):  [employer], Office   Name of any referring provider:  N/A  Names of all persons participating in the telemedicine service and their role in the encounter:  [name] [name], NP, Dillon Beatty/CMA, and Patient  Time spent on call:  7 min with medical assistant    Provider: [name] [name] FNP-C  [name], Nelda Bucks, NP  Patient Care Team: [name], Nelda Bucks, NP as PCP - General (Family Medicine)  Extended Emergency Contact Information Primary Emergency Contact: [place] Phone: [phone] Relation: Son Secondary Emergency Contact: Amparo [place] Mobile Phone: [phone] Relation: Daughter  Code Status: Full Code  Goals of care: Advanced Directive information Advanced Directives 10/06/2018  Does Patient Have a Medical Advance Directive? Yes  Type of Advance Directive [place]  Does patient want to make changes to medical advance directive? No - Patient declined  Copy of [place] in Chart? No - copy requested  Would patient like information on creating a medical advance directive? -     Chief Complaint  Patient presents with  . Acute Visit    Discuss pain in right hand. Telephone visit     HPI:  Pt is a 68 y.o. female seen today for an acute visit for evaluation of right hand pain.she states aware that she has arthritis but just saw something on the TV about Psoriatic arthritis and wonders whether she could have two kinds of arthritis too.she states her fingers are stiff and painful through out the day especially with the cold. pain sometimes shoots to the middle of the hand. She denies any history of  Psoriasis.also denies any redness or swelling of joints.     Past Medical History:  Diagnosis Date  . Allergy   . Anxiety   . Arthritis    "99% of my body" (11/11/2016)  . Asthma   . Bursitis of both hips   . Cataract   . Cervical scoliosis   . Chronic back pain    "all over my back" (11/11/2016)  . Depressive disorder, not elsewhere classified   . Enthesopathy of hip region   . Environmental allergies    "I take Claritin qd; 365 days/year" (11/11/2016)  . Essential hypertension, benign   . Fibromyalgia   . Frequent falls   . GERD (gastroesophageal reflux disease)   . Headache    "at least 1/wk; may last for 2 days or so" (11/11/2016)  . History of blood transfusion 1985   w/hysterectomy  . Lumbar stenosis   . Migraine    "a few/month" (11/11/2016)  . Muscle weakness (generalized)   . Myalgia and myositis, unspecified   . Osteoporosis   . Other abnormal blood chemistry   . Other malaise and fatigue   . Raynaud's syndrome   . Sciatic nerve pain, left   . Sciatica   . Unspecified vitamin D deficiency    Past Surgical History:  Procedure Laterality Date  . ABDOMINAL HYSTERECTOMY  1985  . [facility]; 1979  . SHOULDER OPEN ROTATOR CUFF REPAIR Left     No Known Allergies  Outpatient Encounter Medications as of 04/13/2019  Medication Sig  . acetaminophen (TYLENOL)  500 MG tablet Take 500 mg by mouth every 6 (six) hours as needed for moderate pain or fever.  Marland Kitchen albuterol (VENTOLIN HFA) 108 (90 Base) MCG/ACT inhaler INHALE 2 PUFFS  EVERY 6 (SIX) HOURS AS NEEDED FOR WHEEZING OR SHORTNESS OF BREATH.  Marland Kitchen amitriptyline (ELAVIL) 150 MG tablet Take 1 tablet (150 mg total) by mouth at bedtime. For mood  . antiseptic oral rinse (BIOTENE) LIQD 15 mLs by Mouth Rinse route as needed for dry mouth.  Marland Kitchen aspirin EC 81 MG tablet Take 81 mg by mouth daily.  . carboxymethylcellulose (REFRESH PLUS) 0.5 % SOLN Place 2 drops into both eyes 2 (two) times daily as needed (dry eyes).   .  Cholecalciferol (VITAMIN D3) 2000 units TABS Take 2,000 Units by mouth daily.   . citalopram (CELEXA) 20 MG tablet TAKE 1 TABLET BY MOUTH EVERY DAY  . famotidine (PEPCID) 20 MG tablet Take 1 tablet (20 mg total) by mouth at bedtime.  . fluticasone (FLONASE) 50 MCG/ACT nasal spray Place 2 sprays into both nostrils daily as needed for allergies or rhinitis.  . furosemide (LASIX) 40 MG tablet TAKE 1 TABLET BY MOUTH EVERY DAY  . gabapentin (NEURONTIN) 400 MG capsule TAKE 3 CAPSULES TWICE DAILY  AND TAKE 2 CAPSULES AT BEDTIME  FOR  NEUROPATHY  . guaiFENesin (MUCINEX) 600 MG 12 hr tablet Take 2 tablets (1,200 mg total) by mouth 2 (two) times daily.  Marland Kitchen loratadine (CLARITIN) 10 MG tablet Take 10 mg by mouth daily.  . metoprolol tartrate (LOPRESSOR) 50 MG tablet Take one tablet by mouth twice daily for blood pressure  . oxyCODONE (ROXICODONE) 15 MG immediate release tablet Take 15 mg by mouth every 6 (six) hours as needed for pain.  . SUMAtriptan (IMITREX) 25 MG tablet Take 1 tablet (25 mg total) by mouth every 2 (two) hours as needed for migraine or headache. May repeat in 2 hours if headache persists or recurs.  . vitamin B-12 (CYANOCOBALAMIN) 500 MCG tablet Take 500 mcg by mouth daily.  . vitamin C (VITAMIN C) 500 MG tablet Take 1 tablet (500 mg total) by mouth 2 (two) times daily.   No facility-administered encounter medications on file as of 04/13/2019.     Review of Systems  Constitutional: Negative for appetite change, chills, fatigue and fever.  Respiratory: Negative for cough, chest tightness, shortness of breath and wheezing.   Cardiovascular: Negative for chest pain, palpitations and leg swelling.  Gastrointestinal: Negative for abdominal distention, abdominal pain, constipation, diarrhea, nausea and vomiting.  Musculoskeletal: Positive for arthralgias and gait problem. Negative for joint swelling, neck pain and neck stiffness.  Skin: Negative for color change, pallor and rash.   Neurological: Negative for dizziness, weakness, light-headedness, numbness and headaches.  Psychiatric/Behavioral: Negative for agitation, confusion and sleep disturbance. The patient is not nervous/anxious.     Immunization History  Administered Date(s) Administered  . Fluad Quad(high Dose 65+) 03/29/2019  . Influenza,inj,Quad PF,6+ Mos 05/23/2015, 05/28/2016, 03/24/2018  . Influenza-Unspecified 07/07/2012, 02/28/2014  . Pneumococcal Conjugate-13 07/14/2014  . Pneumococcal Polysaccharide-23 09/11/2017  . Tdap 01/26/2016   Pertinent  Health Maintenance Due  Topic Date Due  . COLONOSCOPY  10/18/2001  . MAMMOGRAM  09/04/2019  . INFLUENZA VACCINE  Completed  . DEXA SCAN  Completed  . PNA vac Low Risk Adult  Completed   Fall Risk  03/29/2019 12/03/2018 10/26/2018 10/04/2018 09/28/2018  Falls in the past year? 1 1 1  0 1  Number falls in past yr: 1 1 1  0 1  Comment - - - - -  Injury with Fall? 0 0 1 0 0  Comment - - - - -  Risk Factor Category  - - - - -  Risk for fall due to : - - - - -  Follow up - - - - -   There were no vitals filed for this visit. There is no height or weight on file to calculate BMI. Physical Exam Unable to complete on Telephone visit.   Labs reviewed: Recent Labs    10/10/18 0402 10/11/18 0344 10/12/18 0402 10/28/18 10/28/18 1330 03/29/19 1011  NA 138 135 136 137 137 140  K 4.0 3.6 4.1 4.9 4.9 4.0  CL 98 96* 99  --  99 103  CO2 26 25 24   --  19* 31  GLUCOSE 89 90 87  --  83 80  BUN 14 16 13 15 15 19   CREATININE 0.72 0.87 0.89 1.3* 1.26* 1.33*  CALCIUM 9.1 8.9 9.0  --  9.3 9.4  MG 2.2 2.2 2.1  --   --   --    Recent Labs    10/10/18 0402 10/11/18 0344 10/28/18 10/28/18 1330 03/29/19 1011  AST 21 19 11* 11 11  ALT 14 14 9 9 6   ALKPHOS 78 71 61 61  --   BILITOT 0.5 0.7  --  0.6 0.8  PROT 7.5 7.5  --  7.0 6.8  ALBUMIN 3.0* 3.2*  --  4.0  --    Recent Labs    10/11/18 0344 10/28/18 10/28/18 1330 03/29/19 1011  WBC 8.6 5.3 5.3 5.7   NEUTROABS 4.0 2 1.9 2,491  HGB 12.1 11.8* 11.8 11.3*  HCT 37.8 35* 35.0 34.7*  MCV 88.9  --  81 85.3  PLT 428* 422* 422 334   Lab Results  Component Value Date   TSH 2.61 03/29/2019   Lab Results  Component Value Date   HGBA1C 5.3 03/29/2019   Lab Results  Component Value Date   CHOL 182 03/29/2019   HDL 62 03/29/2019   LDLCALC 102 (H) 03/29/2019   TRIG 89 03/29/2019   CHOLHDL 2.9 03/29/2019    Significant Diagnostic Results in last 30 days:  No results found.  Assessment/Plan   Pain of right upper extremity Ongoing pain. history of osteoarthritis.worried that she could have Psoriatic arthritis that she saw on the TV.she has no history of Psoriasis so doubt whether she has Psoriatic arthritis signs and symptoms of psoriatic arthritis discussed with patient.Patient verbalized understanding.will continue with current pain management regimen.will refer to rheumatologist if symptoms worsen.     Family/ staff Communication: Reviewed plan of care with patient.   Labs/tests ordered: None  Spent 12 minutes of non-face to face with patient   03/31/2019, NP

## 2019-04-21 ENCOUNTER — Other Ambulatory Visit: Payer: Self-pay

## 2019-04-21 MED ORDER — OXYCODONE HCL 15 MG PO TABS: 15.0000 mg | ORAL_TABLET | Freq: Four times a day (QID) | ORAL | 0 refills | Status: DC | PRN

## 2019-04-21 NOTE — Telephone Encounter (Signed)
Message left on clinical intake voicemail:   Need a refill on controlled medication  I tried to call patient back to let her know message was received. Patients voicemail was full.

## 2019-04-23 ENCOUNTER — Other Ambulatory Visit: Payer: Self-pay | Admitting: Family

## 2019-04-23 DIAGNOSIS — G43009 Migraine without aura, not intractable, without status migrainosus: Secondary | ICD-10-CM

## 2019-04-28 ENCOUNTER — Telehealth: Payer: Self-pay

## 2019-04-28 NOTE — Telephone Encounter (Signed)
Patient called stating she had a form completed for a handicap placard, but it was for [place] and she is in [place].  She was at [facility]

## 2019-04-28 NOTE — Telephone Encounter (Signed)
Patient originally stated she was trying to get a handicap placard, but then said she was trying to get her license.  [name] completed the paperwork, but the patient was in [place] and was requesting additional paperwork.  She wanted to fax the form and have [name] complete it immediately.  I told her that Webb Silversmith had a full schedule today and she was not available to complete the form at this time.  She called back later and said that she should be able to get her license in [place].  I notified [name] of this issue and she said she had already  told the patient that she would not complete any paperwork for [place].

## 2019-04-28 NOTE — Telephone Encounter (Signed)
Patient has called again and not asking for form to be completed.  Again Webb Silversmith stated she would not complete the form for [place], but to ask [place].  Faythe Dingwall said she would confer with Dr. Mariea Clonts when she returned to the office later this afternoon. The faxed form was received.

## 2019-05-09 ENCOUNTER — Inpatient Hospital Stay (HOSPITAL_COMMUNITY)
Admission: EM | Admit: 2019-05-09 | Discharge: 2019-05-17 | DRG: 683 | Disposition: A | Discharge status: Skilled Nursing Facility | Payer: Medicare HMO | Attending: Internal Medicine | Admitting: Internal Medicine

## 2019-05-09 ENCOUNTER — Emergency Department (HOSPITAL_COMMUNITY): Payer: Medicare HMO

## 2019-05-09 ENCOUNTER — Other Ambulatory Visit: Payer: Self-pay

## 2019-05-09 DIAGNOSIS — R296 Repeated falls: Secondary | ICD-10-CM | POA: Diagnosis not present

## 2019-05-09 DIAGNOSIS — G4700 Insomnia, unspecified: Secondary | ICD-10-CM | POA: Diagnosis not present

## 2019-05-09 DIAGNOSIS — Z8616 Personal history of COVID-19: Secondary | ICD-10-CM | POA: Diagnosis present

## 2019-05-09 DIAGNOSIS — I5032 Chronic diastolic (congestive) heart failure: Secondary | ICD-10-CM | POA: Diagnosis not present

## 2019-05-09 DIAGNOSIS — I130 Hypertensive heart and chronic kidney disease with heart failure and stage 1 through stage 4 chronic kidney disease, or unspecified chronic kidney disease: Secondary | ICD-10-CM | POA: Diagnosis present

## 2019-05-09 DIAGNOSIS — D638 Anemia in other chronic diseases classified elsewhere: Secondary | ICD-10-CM | POA: Diagnosis present

## 2019-05-09 DIAGNOSIS — Z7982 Long term (current) use of aspirin: Secondary | ICD-10-CM

## 2019-05-09 DIAGNOSIS — K219 Gastro-esophageal reflux disease without esophagitis: Secondary | ICD-10-CM | POA: Diagnosis present

## 2019-05-09 DIAGNOSIS — I7300 Raynaud's syndrome without gangrene: Secondary | ICD-10-CM | POA: Diagnosis present

## 2019-05-09 DIAGNOSIS — G63 Polyneuropathy in diseases classified elsewhere: Secondary | ICD-10-CM | POA: Diagnosis not present

## 2019-05-09 DIAGNOSIS — Z79899 Other long term (current) drug therapy: Secondary | ICD-10-CM

## 2019-05-09 DIAGNOSIS — J309 Allergic rhinitis, unspecified: Secondary | ICD-10-CM | POA: Diagnosis not present

## 2019-05-09 DIAGNOSIS — F419 Anxiety disorder, unspecified: Secondary | ICD-10-CM | POA: Diagnosis not present

## 2019-05-09 DIAGNOSIS — R001 Bradycardia, unspecified: Secondary | ICD-10-CM | POA: Diagnosis present

## 2019-05-09 DIAGNOSIS — R55 Syncope and collapse: Secondary | ICD-10-CM | POA: Diagnosis not present

## 2019-05-09 DIAGNOSIS — R269 Unspecified abnormalities of gait and mobility: Secondary | ICD-10-CM

## 2019-05-09 DIAGNOSIS — R079 Chest pain, unspecified: Secondary | ICD-10-CM | POA: Diagnosis not present

## 2019-05-09 DIAGNOSIS — Z9181 History of falling: Secondary | ICD-10-CM

## 2019-05-09 DIAGNOSIS — I2721 Secondary pulmonary arterial hypertension: Secondary | ICD-10-CM | POA: Diagnosis present

## 2019-05-09 DIAGNOSIS — R000 Tachycardia, unspecified: Secondary | ICD-10-CM | POA: Diagnosis not present

## 2019-05-09 DIAGNOSIS — N179 Acute kidney failure, unspecified: Secondary | ICD-10-CM | POA: Diagnosis not present

## 2019-05-09 DIAGNOSIS — W010XXA Fall on same level from slipping, tripping and stumbling without subsequent striking against object, initial encounter: Secondary | ICD-10-CM | POA: Diagnosis present

## 2019-05-09 DIAGNOSIS — E559 Vitamin D deficiency, unspecified: Secondary | ICD-10-CM | POA: Diagnosis not present

## 2019-05-09 DIAGNOSIS — M2550 Pain in unspecified joint: Secondary | ICD-10-CM | POA: Diagnosis not present

## 2019-05-09 DIAGNOSIS — Z8249 Family history of ischemic heart disease and other diseases of the circulatory system: Secondary | ICD-10-CM

## 2019-05-09 DIAGNOSIS — R531 Weakness: Secondary | ICD-10-CM | POA: Diagnosis present

## 2019-05-09 DIAGNOSIS — Z20828 Contact with and (suspected) exposure to other viral communicable diseases: Secondary | ICD-10-CM | POA: Diagnosis not present

## 2019-05-09 DIAGNOSIS — R52 Pain, unspecified: Secondary | ICD-10-CM | POA: Diagnosis not present

## 2019-05-09 DIAGNOSIS — N189 Chronic kidney disease, unspecified: Secondary | ICD-10-CM | POA: Diagnosis not present

## 2019-05-09 DIAGNOSIS — N289 Disorder of kidney and ureter, unspecified: Secondary | ICD-10-CM | POA: Diagnosis not present

## 2019-05-09 DIAGNOSIS — W19XXXA Unspecified fall, initial encounter: Secondary | ICD-10-CM

## 2019-05-09 DIAGNOSIS — Y92009 Unspecified place in unspecified non-institutional (private) residence as the place of occurrence of the external cause: Secondary | ICD-10-CM | POA: Diagnosis not present

## 2019-05-09 DIAGNOSIS — S79911A Unspecified injury of right hip, initial encounter: Secondary | ICD-10-CM | POA: Diagnosis not present

## 2019-05-09 DIAGNOSIS — I10 Essential (primary) hypertension: Secondary | ICD-10-CM | POA: Diagnosis not present

## 2019-05-09 DIAGNOSIS — N183 Chronic kidney disease, stage 3 unspecified: Secondary | ICD-10-CM | POA: Diagnosis present

## 2019-05-09 DIAGNOSIS — Z7401 Bed confinement status: Secondary | ICD-10-CM | POA: Diagnosis not present

## 2019-05-09 DIAGNOSIS — Z833 Family history of diabetes mellitus: Secondary | ICD-10-CM | POA: Diagnosis not present

## 2019-05-09 DIAGNOSIS — Z8619 Personal history of other infectious and parasitic diseases: Secondary | ICD-10-CM | POA: Diagnosis not present

## 2019-05-09 DIAGNOSIS — R202 Paresthesia of skin: Secondary | ICD-10-CM | POA: Diagnosis not present

## 2019-05-09 DIAGNOSIS — Z1159 Encounter for screening for other viral diseases: Secondary | ICD-10-CM | POA: Diagnosis not present

## 2019-05-09 DIAGNOSIS — I959 Hypotension, unspecified: Secondary | ICD-10-CM | POA: Diagnosis not present

## 2019-05-09 DIAGNOSIS — M797 Fibromyalgia: Secondary | ICD-10-CM | POA: Diagnosis present

## 2019-05-09 DIAGNOSIS — I272 Pulmonary hypertension, unspecified: Secondary | ICD-10-CM | POA: Diagnosis present

## 2019-05-09 DIAGNOSIS — G43909 Migraine, unspecified, not intractable, without status migrainosus: Secondary | ICD-10-CM | POA: Diagnosis not present

## 2019-05-09 DIAGNOSIS — R5381 Other malaise: Secondary | ICD-10-CM | POA: Diagnosis not present

## 2019-05-09 DIAGNOSIS — Z8261 Family history of arthritis: Secondary | ICD-10-CM | POA: Diagnosis not present

## 2019-05-09 DIAGNOSIS — F329 Major depressive disorder, single episode, unspecified: Secondary | ICD-10-CM | POA: Diagnosis present

## 2019-05-09 DIAGNOSIS — R05 Cough: Secondary | ICD-10-CM | POA: Diagnosis not present

## 2019-05-09 DIAGNOSIS — M25551 Pain in right hip: Secondary | ICD-10-CM | POA: Diagnosis not present

## 2019-05-09 DIAGNOSIS — R0789 Other chest pain: Secondary | ICD-10-CM | POA: Diagnosis not present

## 2019-05-09 DIAGNOSIS — M79609 Pain in unspecified limb: Secondary | ICD-10-CM | POA: Diagnosis not present

## 2019-05-09 DIAGNOSIS — F339 Major depressive disorder, recurrent, unspecified: Secondary | ICD-10-CM | POA: Diagnosis not present

## 2019-05-09 DIAGNOSIS — I509 Heart failure, unspecified: Secondary | ICD-10-CM

## 2019-05-09 HISTORY — DX: Acute kidney failure, unspecified: N17.9

## 2019-05-09 LAB — BASIC METABOLIC PANEL
Anion gap: 9 (ref 5–15)
BUN: 20 mg/dL (ref 8–23)
CO2: 27 mmol/L (ref 22–32)
Calcium: 8.7 mg/dL — ABNORMAL LOW (ref 8.9–10.3)
Chloride: 102 mmol/L (ref 98–111)
Creatinine, Ser: 1.55 mg/dL — ABNORMAL HIGH (ref 0.44–1.00)
GFR calc Af Amer: 40 mL/min — ABNORMAL LOW (ref >60)
GFR calc non Af Amer: 34 mL/min — ABNORMAL LOW (ref >60)
Glucose, Bld: 100 mg/dL — ABNORMAL HIGH (ref 70–99)
Potassium: 3.7 mmol/L (ref 3.5–5.1)
Sodium: 138 mmol/L (ref 135–145)

## 2019-05-09 LAB — URINALYSIS, ROUTINE W REFLEX MICROSCOPIC
Bilirubin Urine: NEGATIVE
Glucose, UA: NEGATIVE mg/dL
Hgb urine dipstick: NEGATIVE
Ketones, ur: NEGATIVE mg/dL
Leukocytes,Ua: NEGATIVE
Nitrite: NEGATIVE
Protein, ur: NEGATIVE mg/dL
Specific Gravity, Urine: 1.006 (ref 1.005–1.030)
pH: 5 (ref 5.0–8.0)

## 2019-05-09 LAB — CBC
HCT: 35.5 % — ABNORMAL LOW (ref 36.0–46.0)
Hemoglobin: 10.9 g/dL — ABNORMAL LOW (ref 12.0–15.0)
MCH: 27.9 pg (ref 26.0–34.0)
MCHC: 30.7 g/dL (ref 30.0–36.0)
MCV: 91 fL (ref 80.0–100.0)
Platelets: 351 10*3/uL (ref 150–400)
RBC: 3.9 MIL/uL (ref 3.87–5.11)
RDW: 13.2 % (ref 11.5–15.5)
WBC: 6.8 10*3/uL (ref 4.0–10.5)
nRBC: 0 % (ref 0.0–0.2)

## 2019-05-09 LAB — SARS CORONAVIRUS 2 (TAT 6-24 HRS): SARS Coronavirus 2: NEGATIVE

## 2019-05-09 LAB — TROPONIN I (HIGH SENSITIVITY)
Troponin I (High Sensitivity): <2 ng/L (ref <18)
Troponin I (High Sensitivity): <2 ng/L (ref <18)

## 2019-05-09 LAB — TSH: TSH: 1.346 u[IU]/mL (ref 0.350–4.500)

## 2019-05-09 LAB — CREATININE, URINE, RANDOM: Creatinine, Urine: 33.78 mg/dL

## 2019-05-09 LAB — SODIUM, URINE, RANDOM: Sodium, Ur: 73 mmol/L

## 2019-05-09 LAB — HIV ANTIBODY (ROUTINE TESTING W REFLEX): HIV Screen 4th Generation wRfx: NONREACTIVE

## 2019-05-09 MED ORDER — ENOXAPARIN SODIUM 40 MG/0.4ML ~~LOC~~ SOLN
40.0000 mg | SUBCUTANEOUS | Status: DC
  Administered 2019-05-09 – 2019-05-16 (×8): 40 mg via SUBCUTANEOUS
  Filled 2019-05-09 (×9): qty 0.4

## 2019-05-09 MED ORDER — FUROSEMIDE 40 MG PO TABS
40.0000 mg | ORAL_TABLET | Freq: Every day | ORAL | Status: DC
  Administered 2019-05-09 – 2019-05-17 (×9): 40 mg via ORAL
  Filled 2019-05-09 (×8): qty 1
  Filled 2019-05-09: qty 2

## 2019-05-09 MED ORDER — SUMATRIPTAN SUCCINATE 25 MG PO TABS
25.0000 mg | ORAL_TABLET | ORAL | Status: DC | PRN
  Administered 2019-05-09: 22:00:00 25 mg via ORAL
  Filled 2019-05-09 (×2): qty 1

## 2019-05-09 MED ORDER — ALBUTEROL SULFATE (2.5 MG/3ML) 0.083% IN NEBU
2.5000 mg | INHALATION_SOLUTION | Freq: Four times a day (QID) | RESPIRATORY_TRACT | Status: DC | PRN
  Administered 2019-05-15: 2.5 mg via RESPIRATORY_TRACT
  Filled 2019-05-09: qty 3

## 2019-05-09 MED ORDER — POLYVINYL ALCOHOL 1.4 % OP SOLN
2.0000 [drp] | OPHTHALMIC | Status: DC | PRN
  Administered 2019-05-13 – 2019-05-14 (×2): 2 [drp] via OPHTHALMIC
  Filled 2019-05-09: qty 15

## 2019-05-09 MED ORDER — AMITRIPTYLINE HCL 50 MG PO TABS
150.0000 mg | ORAL_TABLET | Freq: Every day | ORAL | Status: DC
  Administered 2019-05-09 – 2019-05-16 (×8): 150 mg via ORAL
  Filled 2019-05-09 (×7): qty 3
  Filled 2019-05-09: qty 2
  Filled 2019-05-09 (×3): qty 3
  Filled 2019-05-09: qty 2

## 2019-05-09 MED ORDER — ASPIRIN EC 81 MG PO TBEC
81.0000 mg | DELAYED_RELEASE_TABLET | Freq: Every day | ORAL | Status: DC
  Administered 2019-05-09 – 2019-05-17 (×9): 81 mg via ORAL
  Filled 2019-05-09 (×9): qty 1

## 2019-05-09 MED ORDER — LORATADINE 10 MG PO TABS
10.0000 mg | ORAL_TABLET | Freq: Every day | ORAL | Status: DC
  Administered 2019-05-09 – 2019-05-17 (×9): 10 mg via ORAL
  Filled 2019-05-09 (×9): qty 1

## 2019-05-09 MED ORDER — ONDANSETRON HCL 4 MG/2ML IJ SOLN: 4.0000 mg | Freq: Four times a day (QID) | INTRAMUSCULAR | Status: DC | PRN

## 2019-05-09 MED ORDER — CARBOXYMETHYLCELLULOSE SODIUM 0.5 % OP SOLN: 2.0000 [drp] | Freq: Two times a day (BID) | OPHTHALMIC | Status: DC | PRN

## 2019-05-09 MED ORDER — FAMOTIDINE 20 MG PO TABS
20.0000 mg | ORAL_TABLET | Freq: Every day | ORAL | Status: DC
  Administered 2019-05-09 – 2019-05-16 (×8): 20 mg via ORAL
  Filled 2019-05-09 (×8): qty 1

## 2019-05-09 MED ORDER — ONDANSETRON HCL 4 MG PO TABS: 4.0000 mg | ORAL_TABLET | Freq: Four times a day (QID) | ORAL | Status: DC | PRN

## 2019-05-09 MED ORDER — CITALOPRAM HYDROBROMIDE 20 MG PO TABS
20.0000 mg | ORAL_TABLET | Freq: Every day | ORAL | Status: DC
  Administered 2019-05-09 – 2019-05-17 (×9): 20 mg via ORAL
  Filled 2019-05-09 (×4): qty 1
  Filled 2019-05-09: qty 2
  Filled 2019-05-09 (×4): qty 1

## 2019-05-09 MED ORDER — ACETAMINOPHEN 650 MG RE SUPP: 650.0000 mg | Freq: Four times a day (QID) | RECTAL | Status: DC | PRN

## 2019-05-09 MED ORDER — DICLOFENAC SODIUM 1 % TD GEL
2.0000 g | Freq: Four times a day (QID) | TRANSDERMAL | Status: DC
  Administered 2019-05-09 – 2019-05-17 (×28): 2 g via TOPICAL
  Filled 2019-05-09 (×2): qty 100

## 2019-05-09 MED ORDER — SODIUM CHLORIDE 0.9% FLUSH
3.0000 mL | Freq: Once | INTRAVENOUS | Status: AC
  Administered 2019-05-09: 09:00:00 3 mL via INTRAVENOUS

## 2019-05-09 MED ORDER — SODIUM CHLORIDE 0.9 % IV SOLN
INTRAVENOUS | Status: DC
  Administered 2019-05-09: 15:00:00 via INTRAVENOUS

## 2019-05-09 MED ORDER — OXYCODONE HCL 5 MG PO TABS
15.0000 mg | ORAL_TABLET | Freq: Four times a day (QID) | ORAL | Status: DC | PRN
  Administered 2019-05-15: 15 mg via ORAL
  Filled 2019-05-09: qty 3

## 2019-05-09 MED ORDER — FUROSEMIDE 20 MG PO TABS: 40.0000 mg | ORAL_TABLET | Freq: Every day | ORAL | Status: DC

## 2019-05-09 MED ORDER — ACETAMINOPHEN 325 MG PO TABS
650.0000 mg | ORAL_TABLET | Freq: Four times a day (QID) | ORAL | Status: DC | PRN
  Administered 2019-05-11: 12:00:00 650 mg via ORAL
  Filled 2019-05-09: qty 2

## 2019-05-09 MED ORDER — ACETAMINOPHEN 500 MG PO TABS
1000.0000 mg | ORAL_TABLET | Freq: Once | ORAL | Status: AC
  Administered 2019-05-09: 10:00:00 1000 mg via ORAL
  Filled 2019-05-09: qty 2

## 2019-05-09 MED ORDER — METOPROLOL TARTRATE 50 MG PO TABS
50.0000 mg | ORAL_TABLET | Freq: Two times a day (BID) | ORAL | Status: DC
  Administered 2019-05-09 – 2019-05-17 (×15): 50 mg via ORAL
  Filled 2019-05-09 (×16): qty 1

## 2019-05-09 MED ORDER — GUAIFENESIN ER 600 MG PO TB12: 1200.0000 mg | ORAL_TABLET | Freq: Two times a day (BID) | ORAL | Status: DC | PRN

## 2019-05-09 MED ORDER — GABAPENTIN 400 MG PO CAPS
800.0000 mg | ORAL_CAPSULE | Freq: Every day | ORAL | Status: DC
  Administered 2019-05-09 – 2019-05-16 (×8): 800 mg via ORAL
  Filled 2019-05-09 (×10): qty 2

## 2019-05-09 MED ORDER — ALBUTEROL SULFATE (2.5 MG/3ML) 0.083% IN NEBU: 2.5000 mg | INHALATION_SOLUTION | Freq: Four times a day (QID) | RESPIRATORY_TRACT | Status: DC | PRN

## 2019-05-09 MED ORDER — GABAPENTIN 400 MG PO CAPS
1200.0000 mg | ORAL_CAPSULE | ORAL | Status: DC
  Administered 2019-05-10 – 2019-05-17 (×16): 1200 mg via ORAL
  Filled 2019-05-09 (×5): qty 3
  Filled 2019-05-09: qty 12
  Filled 2019-05-09 (×9): qty 3

## 2019-05-09 MED ORDER — SODIUM CHLORIDE 0.9 % IV BOLUS
500.0000 mL | Freq: Once | INTRAVENOUS | Status: AC
  Administered 2019-05-09: 500 mL via INTRAVENOUS

## 2019-05-09 MED ORDER — IOHEXOL 350 MG/ML SOLN
100.0000 mL | Freq: Once | INTRAVENOUS | Status: AC | PRN
  Administered 2019-05-09: 09:00:00 70 mL via INTRAVENOUS

## 2019-05-09 NOTE — H&P (Addendum)
History and Physical    Melinda [name] HQI:[id number] DOB: [birth year] DOA: 05/09/2019  Referring MD/NP/PA:  PCP: Caesar Bookman, NP  Patient coming from: home  Chief Complaint: Right-sided chest pain  I have personally briefly reviewed patient's old medical records in [facility]   HPI: Melinda [name] is a 68 y.o. female with medical history significant of hypertension, CHF, pulmonary hypertension, frequent falls, fibromyalgia, COVID-19 infection in 09/2018, and chronic back pain.  She presents after having a fall at home with complaints of chest pain.  At baseline patient utilizes a walker to ambulate.  She had an coming from patient to the bathroom when she felt mild and her legs gave out on her.  She did not think that she lost consciousness or hit her head.  When she fell her arms came into her chest.  Her last 1 to 2 days she has had a dry cough that just recently productive with thick mucus production.  She is not sure of any specific contacts but reports that there have been 5 cases of COVID-19 broken out in her building where she lives.  Notes associated symptoms of poor appetite due to change in taste, leg swelling, and depressed mood.  Patient reports that she has no help at home.   ED Course: Upon admission into the emergency department patient was seen afebrile with vital signs relatively within normal limits.  Orthostatic vital signs were checked and noted to be negative, but patient did become symptomatic when standing.  Labs significant for high-sensitivity troponin negative x2, hemoglobin 10.9, BUN 20, and creatinine 1.55.  Chest x-ray showed no acute abnormality.  CT angiogram of the chest reported signs of cardiomegaly with right heart dysfunction.  Review of systems: A complete 10 point review of systems was performed and negative except for as noted above in HPI.  Past Medical History:  Diagnosis Date   Allergy    Anxiety    Arthritis    "99% of my body"  (11/11/2016)   Asthma    Bursitis of both hips    Cataract    Cervical scoliosis    Chronic back pain    "all over my back" (11/11/2016)   Depressive disorder, not elsewhere classified    Enthesopathy of hip region    Environmental allergies    "I take Claritin qd; 365 days/year" (11/11/2016)   Essential hypertension, benign    Fibromyalgia    Frequent falls    GERD (gastroesophageal reflux disease)    Headache    "at least 1/wk; may last for 2 days or so" (11/11/2016)   History of blood transfusion 1985   w/hysterectomy   Lumbar stenosis    Migraine    "a few/month" (11/11/2016)   Muscle weakness (generalized)    Myalgia and myositis, unspecified    Osteoporosis    Other abnormal blood chemistry    Other malaise and fatigue    Raynaud's syndrome    Sciatic nerve pain, left    Sciatica    Unspecified vitamin D deficiency     Past Surgical History:  Procedure Laterality Date   ABDOMINAL HYSTERECTOMY  1985   CESAREAN SECTION  1976; 1979   SHOULDER OPEN ROTATOR CUFF REPAIR Left      reports that she has never smoked. She has never used smokeless tobacco. She reports that she does not drink alcohol or use drugs.  No Known Allergies  Family History  Problem Relation Age of Onset   Dementia Mother  Heart disease Mother    Hypertension Mother    Diabetes Mother    Diabetes Sister    Cancer Sister        lung   Cancer Other        Nepher (Mothers side)   Diabetes Maternal Grandmother    Arthritis Maternal Grandmother    Diabetes Cousin        Mother's side   Arthritis Sister    Arthritis Cousin        Mother's side    Colon cancer Neg Hx    Esophageal cancer Neg Hx    Liver cancer Neg Hx    Pancreatic cancer Neg Hx    Rectal cancer Neg Hx    Stomach cancer Neg Hx     Prior to Admission medications   Medication Sig Start Date End Date Taking? Authorizing Provider  acetaminophen (TYLENOL) 500 MG tablet Take 500  mg by mouth every 6 (six) hours as needed for moderate pain or fever.    [provider]  albuterol (VENTOLIN HFA) 108 (90 Base) MCG/ACT inhaler INHALE 2 PUFFS  EVERY 6 (SIX) HOURS AS NEEDED FOR WHEEZING OR SHORTNESS OF BREATH. 03/24/18   Kirt Boys, DO  amitriptyline (ELAVIL) 150 MG tablet Take 1 tablet (150 mg total) by mouth at bedtime. For mood 03/29/19 06/27/19  [name], [name] C, NP  antiseptic oral rinse (BIOTENE) LIQD 15 mLs by Mouth Rinse route as needed for dry mouth.    [provider]  aspirin EC 81 MG tablet Take 81 mg by mouth daily.    [provider]  carboxymethylcellulose (REFRESH PLUS) 0.5 % SOLN Place 2 drops into both eyes 2 (two) times daily as needed (dry eyes).     [provider]  Cholecalciferol (VITAMIN D3) 2000 units TABS Take 2,000 Units by mouth daily.     [provider]  citalopram (CELEXA) 20 MG tablet TAKE 1 TABLET BY MOUTH EVERY DAY 03/18/19   [name], [name] C, NP  famotidine (PEPCID) 20 MG tablet Take 1 tablet (20 mg total) by mouth at bedtime. 09/03/18   [name], [name] C, NP  fluticasone (FLONASE) 50 MCG/ACT nasal spray Place 2 sprays into both nostrils daily as needed for allergies or rhinitis. 02/22/19   [name], [name] C, NP  furosemide (LASIX) 40 MG tablet TAKE 1 TABLET BY MOUTH EVERY DAY 02/18/19   [name], [name] C, NP  gabapentin (NEURONTIN) 400 MG capsule TAKE 3 CAPSULES TWICE DAILY  AND TAKE 2 CAPSULES AT BEDTIME  FOR  NEUROPATHY 02/10/19   [name], [name] C, NP  guaiFENesin (MUCINEX) 600 MG 12 hr tablet Take 2 tablets (1,200 mg total) by mouth 2 (two) times daily. 10/12/18   Elgergawy, Leana Roe, MD  loratadine (CLARITIN) 10 MG tablet Take 10 mg by mouth daily.    [provider]  metoprolol tartrate (LOPRESSOR) 50 MG tablet Take one tablet by mouth twice daily for blood pressure 02/11/19   [name], [name] C, NP  oxyCODONE (ROXICODONE) 15 MG immediate release tablet Take 1 tablet (15 mg total) by mouth every 6  (six) hours as needed for pain. 04/21/19   [name], [name] C, NP  SUMAtriptan (IMITREX) 25 MG tablet TAKE 1 TAB EVERY 2 HRS AS NEEDED FOR MIGRAINE OR HEADACHE. MAY REPEAT IN 2 HRS IF PERSISTS/RECURS. 04/25/19   [name], [name] C, NP  vitamin B-12 (CYANOCOBALAMIN) 500 MCG tablet Take 500 mcg by mouth daily.    [provider]  vitamin C (VITAMIN C) 500  MG tablet Take 1 tablet (500 mg total) by mouth 2 (two) times daily. 10/12/18   Elgergawy, Leana Roe, MD    Physical Exam:  Constitutional: Elderly female who appears to be fatigued Vitals:   05/09/19 0945 05/09/19 1000 05/09/19 1015 05/09/19 1030  BP: (!) 144/78 (!) 152/78 (!) 157/95 (!) 157/88  Pulse: 60 (!) 59 (!) 58 (!) 58  Resp: 17 14 12 13   Temp:      TempSrc:      SpO2: 100% 97% 100% 100%  Weight:      Height:       Eyes: PERRL, lids and conjunctivae normal ENMT: Mucous membranes are dry.  Posterior pharynx clear of any exudate or lesions.  Neck: normal, supple, no masses, no thyromegaly Respiratory: clear to auscultation bilaterally, no wheezing, no crackles. Normal respiratory effort. No accessory muscle use.  Cardiovascular: Bradycardic, no murmurs / rubs / gallops. No extremity edema. 2+ pedal pulses. No carotid bruits.  Chest pain reproducible with palpation of the right chest wall. Abdomen: no tenderness, no masses palpated. No hepatosplenomegaly. Bowel sounds positive.  Musculoskeletal: no clubbing / cyanosis. No joint deformity upper and lower extremities. Good ROM, no contractures. Normal muscle tone.  Skin: no rashes, lesions, ulcers. No induration Neurologic: CN 2-12 grossly intact. Sensation intact, DTR normal. Strength 5/5 in all 4.  Psychiatric: Normal judgment and insight. Alert and oriented x 3.  Depressed mood.     Labs on Admission: I have personally reviewed following labs and imaging studies  CBC: Recent Labs  Lab 05/09/19 0328  WBC 6.8  HGB 10.9*  HCT 35.5*  MCV 91.0  PLT 351   Basic  Metabolic Panel: Recent Labs  Lab 05/09/19 0328  NA 138  K 3.7  CL 102  CO2 27  GLUCOSE 100*  BUN 20  CREATININE 1.55*  CALCIUM 8.7*   GFR: Estimated Creatinine Clearance: 42 mL/min (A) (by C-G formula based on SCr of 1.55 mg/dL (H)). Liver Function Tests: No results for input(s): AST, ALT, ALKPHOS, BILITOT, PROT, ALBUMIN in the last 168 hours. No results for input(s): LIPASE, AMYLASE in the last 168 hours. No results for input(s): AMMONIA in the last 168 hours. Coagulation Profile: No results for input(s): INR, PROTIME in the last 168 hours. Cardiac Enzymes: No results for input(s): CKTOTAL, CKMB, CKMBINDEX, TROPONINI in the last 168 hours. BNP (last 3 results) No results for input(s): PROBNP in the last 8760 hours. HbA1C: No results for input(s): HGBA1C in the last 72 hours. CBG: No results for input(s): GLUCAP in the last 168 hours. Lipid Profile: No results for input(s): CHOL, HDL, LDLCALC, TRIG, CHOLHDL, LDLDIRECT in the last 72 hours. Thyroid Function Tests: No results for input(s): TSH, T4TOTAL, FREET4, T3FREE, THYROIDAB in the last 72 hours. Anemia Panel: No results for input(s): VITAMINB12, FOLATE, FERRITIN, TIBC, IRON, RETICCTPCT in the last 72 hours. Urine analysis:    Component Value Date/Time   COLORURINE STRAW (A) 05/09/2019 0931   APPEARANCEUR CLEAR 05/09/2019 0931   APPEARANCEUR Clear 05/23/2015 1447   LABSPEC 1.006 05/09/2019 0931   PHURINE 5.0 05/09/2019 0931   GLUCOSEU NEGATIVE 05/09/2019 0931   HGBUR NEGATIVE 05/09/2019 0931   BILIRUBINUR NEGATIVE 05/09/2019 0931   BILIRUBINUR Negative 05/23/2015 1447   KETONESUR NEGATIVE 05/09/2019 0931   PROTEINUR NEGATIVE 05/09/2019 0931   NITRITE NEGATIVE 05/09/2019 0931   LEUKOCYTESUR NEGATIVE 05/09/2019 0931   Sepsis Labs: No results found for this or any previous visit (from the past 240 hour(s)).   Radiological Exams  on Admission: Dg Chest 2 View  Result Date: 05/09/2019 CLINICAL DATA:  Chest pain  EXAM: CHEST - 2 VIEW COMPARISON:  None. FINDINGS: The heart size and mediastinal contours are within normal limits. Both lungs are clear. The visualized skeletal structures are unremarkable. IMPRESSION: No active cardiopulmonary disease. Electronically Signed   By: Deatra Robinson M.D.   On: 05/09/2019 03:41   Ct Angio Chest Pe W And/or Wo Contrast  Result Date: 05/09/2019 CLINICAL DATA:  68 year old female with right-sided chest pain. EXAM: CT ANGIOGRAPHY CHEST WITH CONTRAST TECHNIQUE: Multidetector CT imaging of the chest was performed using the standard protocol during bolus administration of intravenous contrast. Multiplanar CT image reconstructions and MIPs were obtained to evaluate the vascular anatomy. CONTRAST:  70mL OMNIPAQUE IOHEXOL 350 MG/ML SOLN COMPARISON:  Chest radiograph dated 05/09/2019 FINDINGS: Cardiovascular: There is mild cardiomegaly. No pericardial effusion. There is retrograde flow of contrast from the right atrium into the IVC indicative of a degree of right heart dysfunction. Correlation with echocardiogram recommended. Mild atherosclerotic calcification of the aortic arch. There is no CT evidence of pulmonary embolism. Mediastinum/Nodes: No hilar or mediastinal adenopathy. The esophagus is grossly unremarkable. No mediastinal fluid collection. Lungs/Pleura: Patchy areas of hazy airspace density may represent atelectatic changes or areas of air trapping related to underlying small airways versus small vessel disease. Linear atelectasis/scarring noted in the lingula and left lower lobe. There is no focal consolidation, pleural effusion, or pneumothorax. There is tracheal malacia. The central airways are patent. Upper Abdomen: Small scattered calcified hepatic granuloma. Musculoskeletal: No chest wall abnormality. No acute or significant osseous findings. Review of the MIP images confirms the above findings. IMPRESSION: 1. No acute intrathoracic pathology. No CT evidence of pulmonary  embolism. 2. Mild cardiomegaly with evidence of right heart dysfunction. Correlation with echocardiogram recommended. 3. Aortic Atherosclerosis (ICD10-I70.0). Electronically Signed   By: Elgie Collard M.D.   On: 05/09/2019 09:35    EKG: Independently reviewed.  Sinus bradycardia at 59 bpm  Assessment/Plan Fall secondary to near syncope: Patient presents after having a near syncopal episode at home and subsequently falling.  CT angiogram of the chest did not note any signs of a pulmonary embolus, but did show signs of right heart dysfunction.  Question if symptoms were secondary to bradycardia as patient heart rates were noted to be in the 50s. -Admit to a medical telemetry bed -Check TSH -Physical therapy to evaluate and treat -Follow-up telemetry overnight  -Consider need of Holter monitor.  Right-sided chest pain: High-sensitivity troponins negative x2.  EKG without significant changes.  No rib fractures noted on imaging.  Suspect likely related with recent fall as pain is reproducible on physical exam.  -Voltaren gel to chest as needed for pain  Renal insufficiency superimposed on chronic kidney disease stage III: Acute on chronic. On admission BUN 20 and creatinine 1.55.  Baseline creatinine previously noted to be around 1.3. -check urine sodium, urine creatinine, and urine urea(FeNa 0.21%) -Gentle IV fluids of 50 mL/h overnight -Recheck creatinine in a.m.  Pulmonary artery hypertension, diastolic congestive heart failure: Patient does not appear grossly fluid overloaded at this time.  Noted to have moderate pulmonary artery hypertension per echocardiogram in 2018 with EF noted to be 55 to 60% with grade 2 diastolic dysfunction.. -Check echocardiogram  History of COVID-19: Diagnosed previously in 09/2018.  Repeat testing today was negative.  Essential hypertension -Continue metoprolol and furosemide  Anemia of chronic disease: Patient presents with hemoglobin 10.9 which appears  near baseline of around 11. -  Continue to monitor  Anxiety and depression -Continue Elavil and Celexa  DVT prophylaxis: lovenox Code Status: full  Family Communication: No family present bedside Disposition Plan: TBD Consults called: none Admission status: observation  Clydie Braun MD [facility] Pager [phone]   If 7PM-7AM, please contact night-coverage [email or web address] Password [email or web address]  05/09/2019, 11:22 AM

## 2019-05-09 NOTE — ED Triage Notes (Signed)
Pt was at home and started having a cough yesterday and today with some right sided chest pain. Hurts to palpate on the right side and worsen with cough. No fevers, no chills

## 2019-05-09 NOTE — ED Provider Notes (Signed)
[facility] EMERGENCY DEPARTMENT Provider Note   CSN: [id number] Arrival date & time: 05/09/19  4098     History   Chief Complaint Chief Complaint  Patient presents with   Chest Pain   Cough    HPI Melinda [name] is a 68 y.o. female.     Melinda [name] is a 68 y.o. female with a history of hypertension, frequent falls, fibromyalgia, migraines, Raynaud's, chronic back pain, CHF, pulmonary HTN, who presents to the ED for evaluation of right-sided chest pain.  Pain is a constant dull ache that becomes sharp and "grabbing" with deep breathing.  Pain is also worse with palpation.  Patient reports pain started sometime between 5 PM and 6 PM yesterday after the patient had a fall at home.  She reports she was walking with her walker from the kitchen to the bathroom and started to feel lightheaded and then reports that her legs just gave out.  Patient does not think that she totally lost consciousness, and reports that she fell forward onto her arms which went into her chest.  She did not hit her head reports since the fall she has had chest pain over the right side.  Chest pain is not made worse with exertion.  She has not had any additional episodes of lightheadedness or syncope but does report that this near syncopal episode is new for her. Has some chronic leg swelling which patient reports is not any worse than usual. She has a history of frequent falls reports this is usually due to her not using her walker or cane or feeling like she is losing her balance she has never felt like she is going to pass out before.  She reports that yesterday throughout the day she was also coughing very frequently which is new she has not had any fevers or chills.  Does report cough is productive of thick mucus.  Unsure of any sick contacts.  She denies any abdominal pain, nausea, vomiting or diarrhea.  Patient reports that she lives alone, typically walks with a walker or cane has a history of  frequent falls, does not have any help at home, is treated for depression reports worsening of this but denies any suicidal ideations.      Past Medical History:  Diagnosis Date   Allergy    Anxiety    Arthritis    "99% of my body" (11/11/2016)   Asthma    Bursitis of both hips    Cataract    Cervical scoliosis    Chronic back pain    "all over my back" (11/11/2016)   Depressive disorder, not elsewhere classified    Enthesopathy of hip region    Environmental allergies    "I take Claritin qd; 365 days/year" (11/11/2016)   Essential hypertension, benign    Fibromyalgia    Frequent falls    GERD (gastroesophageal reflux disease)    Headache    "at least 1/wk; may last for 2 days or so" (11/11/2016)   History of blood transfusion 1985   w/hysterectomy   Lumbar stenosis    Migraine    "a few/month" (11/11/2016)   Muscle weakness (generalized)    Myalgia and myositis, unspecified    Osteoporosis    Other abnormal blood chemistry    Other malaise and fatigue    Raynaud's syndrome    Sciatic nerve pain, left    Sciatica    Unspecified vitamin D deficiency     Patient Active Problem List  Diagnosis Date Noted   COVID-19 virus infection 10/12/2018   CAP (community acquired pneumonia) 10/06/2018   Polyneuropathy in diseases classified elsewhere ([place]) 10/04/2018   Prediabetes 09/12/2017   High risk medication use 09/12/2017   Frequent falls 09/12/2017   Chronic pain syndrome 08/14/2017   Gastroesophageal reflux disease 08/14/2017   Cough variant asthma 03/14/2017   Pulmonary hypertension ([place]) 03/12/2017   Chest pain 11/11/2016   Asthma 11/11/2016   Atypical chest pain    Costochondritis    Chronic congestive heart failure ([place])    MDD (major depressive disorder), recurrent episode ([place]) 05/23/2015   Allergic rhinitis 11/03/2014   Paresthesia 09/08/2014   Peripheral neuropathy 07/14/2014   Morbid obesity due to excess  calories ([place]) 07/14/2014   Pain in limb 07/13/2014   Abnormality of gait 07/13/2014   Fibromyalgia 06/13/2014   Spinal stenosis 01/27/2013   HTN (hypertension) 11/30/2012    Past Surgical History:  Procedure Laterality Date   [place]; [place] Left      OB History   No obstetric history on file.      Home Medications    Prior to Admission medications   Medication Sig Start Date End Date Taking? Authorizing Provider  acetaminophen (TYLENOL) 500 MG tablet Take 500 mg by mouth every 6 (six) hours as needed for moderate pain or fever.    [provider]  albuterol (VENTOLIN HFA) 108 (90 Base) MCG/ACT inhaler INHALE 2 PUFFS  EVERY 6 (SIX) HOURS AS NEEDED FOR WHEEZING OR SHORTNESS OF BREATH. 03/24/18   Gildardo Cranker, DO  amitriptyline (ELAVIL) 150 MG tablet Take 1 tablet (150 mg total) by mouth at bedtime. For mood 03/29/19 06/27/19  [name], [name] C, NP  antiseptic oral rinse (BIOTENE) LIQD 15 mLs by Mouth Rinse route as needed for dry mouth.    [provider]  aspirin EC 81 MG tablet Take 81 mg by mouth daily.    [provider]  carboxymethylcellulose (REFRESH PLUS) 0.5 % SOLN Place 2 drops into both eyes 2 (two) times daily as needed (dry eyes).     [provider]  Cholecalciferol (VITAMIN D3) 2000 units TABS Take 2,000 Units by mouth daily.     [provider]  citalopram (CELEXA) 20 MG tablet TAKE 1 TABLET BY MOUTH EVERY DAY 03/18/19   [name], [name] C, NP  famotidine (PEPCID) 20 MG tablet Take 1 tablet (20 mg total) by mouth at bedtime. 09/03/18   [name], [name] C, NP  fluticasone (FLONASE) 50 MCG/ACT nasal spray Place 2 sprays into both nostrils daily as needed for allergies or rhinitis. 02/22/19   [name], [name] C, NP  furosemide (LASIX) 40 MG tablet TAKE 1 TABLET BY MOUTH EVERY DAY 02/18/19   [name], [name] C, NP  gabapentin (NEURONTIN) 400 MG capsule  TAKE 3 CAPSULES TWICE DAILY  AND TAKE 2 CAPSULES AT BEDTIME  FOR  NEUROPATHY 02/10/19   [name], [name] C, NP  guaiFENesin (MUCINEX) 600 MG 12 hr tablet Take 2 tablets (1,200 mg total) by mouth 2 (two) times daily. 10/12/18   Elgergawy, Silver Huguenin, MD  loratadine (CLARITIN) 10 MG tablet Take 10 mg by mouth daily.    [provider]  metoprolol tartrate (LOPRESSOR) 50 MG tablet Take one tablet by mouth twice daily for blood pressure 02/11/19   [name], [name] C, NP  oxyCODONE (ROXICODONE) 15 MG immediate release tablet Take 1 tablet (15 mg total) by  mouth every 6 (six) hours as needed for pain. 04/21/19   [name], [name] C, NP  SUMAtriptan (IMITREX) 25 MG tablet TAKE 1 TAB EVERY 2 HRS AS NEEDED FOR MIGRAINE OR HEADACHE. MAY REPEAT IN 2 HRS IF PERSISTS/RECURS. 04/25/19   [name], [name] C, NP  vitamin B-12 (CYANOCOBALAMIN) 500 MCG tablet Take 500 mcg by mouth daily.    [provider]  vitamin C (VITAMIN C) 500 MG tablet Take 1 tablet (500 mg total) by mouth 2 (two) times daily. 10/12/18   Elgergawy, Leana [place]wood S, MD    Family History Family History  Problem Relation Age of Onset   Dementia Mother    Heart disease Mother    Hypertension Mother    Diabetes Mother    Diabetes Sister    Cancer Sister        lung   Cancer Other        Nepher (Mothers side)   Diabetes Maternal Grandmother    Arthritis Maternal Grandmother    Diabetes Cousin        Mother's side   Arthritis Sister    Arthritis Cousin        Mother's side    Colon cancer Neg Hx    Esophageal cancer Neg Hx    Liver cancer Neg Hx    Pancreatic cancer Neg Hx    Rectal cancer Neg Hx    Stomach cancer Neg Hx     Social History Social History   Tobacco Use   Smoking status: Never Smoker   Smokeless tobacco: Never Used  Substance Use Topics   Alcohol use: No    Alcohol/week: 0.0 standard drinks   Drug use: No     Allergies   Patient has no known allergies.   Review of Systems Review of  Systems  Constitutional: Negative for chills and fever.  HENT: Negative.   Respiratory: Positive for cough and shortness of breath.   Cardiovascular: Positive for chest pain. Negative for palpitations and leg swelling.  Gastrointestinal: Negative for abdominal pain, diarrhea, nausea and vomiting.  Genitourinary: Negative for dysuria.  Musculoskeletal: Negative for arthralgias and myalgias.  Skin: Negative for color change and rash.  Neurological: Positive for weakness (Generalized) and light-headedness. Negative for dizziness, syncope, numbness and headaches.     Physical Exam Updated Vital Signs BP 106/75 (BP Location: Left Arm)    Pulse (!) 53    Temp 97.6 F (36.4 C) (Oral)    Resp 14    SpO2 95%   Physical Exam Vitals signs and nursing note reviewed.  Constitutional:      General: She is not in acute distress.    Appearance: She is well-developed. She is not diaphoretic.     Comments: Chronically ill-appearing but in no acute distress  HENT:     Head: Normocephalic and atraumatic.  Eyes:     General:        Right eye: No discharge.        Left eye: No discharge.     Pupils: Pupils are equal, round, and reactive to light.  Neck:     Musculoskeletal: Neck supple.  Cardiovascular:     Rate and Rhythm: Regular rhythm. Bradycardia present.     Pulses:          Radial pulses are 2+ on the right side and 2+ on the left side.       Dorsalis pedis pulses are 2+ on the right side and 2+ on the left side.  Heart sounds: Normal heart sounds. No murmur. No friction rub. No gallop.   Pulmonary:     Effort: Pulmonary effort is normal. No respiratory distress.     Breath sounds: Normal breath sounds. No wheezing or rales.     Comments: Respirations equal and unlabored, patient able to speak in full sentences, lungs clear to auscultation bilaterally Chest:     Chest wall: Tenderness present.     Comments: Tenderness over the right anterior chest wall without palpable deformity, no  overlying ecchymosis or palpable crepitus Abdominal:     General: Bowel sounds are normal. There is no distension.     Palpations: Abdomen is soft. There is no mass.     Tenderness: There is no abdominal tenderness. There is no guarding.     Comments: Abdomen soft, nondistended, nontender to palpation in all quadrants without guarding or peritoneal signs  Musculoskeletal:        General: No deformity.     Right lower leg: Edema present.     Left lower leg: Edema present.     Comments: Chronic nonpitting edema patient reports this is at baseline  Skin:    General: Skin is warm and dry.     Capillary Refill: Capillary refill takes less than 2 seconds.  Neurological:     Mental Status: She is alert.     Coordination: Coordination normal.     Comments: Speech is clear, able to follow commands CN III-XII intact Normal strength in upper and lower extremities bilaterally including dorsiflexion and plantar flexion, strong and equal grip strength Sensation normal to light and sharp touch Moves extremities without ataxia, coordination intact  Psychiatric:        Mood and Affect: Mood is depressed.        Behavior: Behavior normal.        Thought Content: Thought content does not include suicidal ideation.      ED Treatments / Results  Labs (all labs ordered are listed, but only abnormal results are displayed) Labs Reviewed  BASIC METABOLIC PANEL - Abnormal; Notable for the following components:      Result Value   Glucose, Bld 100 (*)    Creatinine, Ser 1.55 (*)    Calcium 8.7 (*)    GFR calc non Af Amer 34 (*)    GFR calc Af Amer 40 (*)    All other components within normal limits  CBC - Abnormal; Notable for the following components:   Hemoglobin 10.9 (*)    HCT 35.5 (*)    All other components within normal limits  URINALYSIS, ROUTINE W REFLEX MICROSCOPIC - Abnormal; Notable for the following components:   Color, Urine STRAW (*)    All other components within normal limits    SARS CORONAVIRUS 2 (TAT 6-24 HRS)  TROPONIN I (HIGH SENSITIVITY)  TROPONIN I (HIGH SENSITIVITY)    EKG EKG Interpretation  Date/Time:  Monday May 09 2019 03:18:09 EST Ventricular Rate:  59 PR Interval:  174 QRS Duration: 94 QT Interval:  468 QTC Calculation: 463 R Axis:   16 Text Interpretation: Sinus bradycardia Otherwise normal ECG Confirmed by Raeford RazorKohut, Stephen [phone]([id number]) on 05/09/2019 8:36:05 AM   Radiology Dg Chest 2 View  Result Date: 05/09/2019 CLINICAL DATA:  Chest pain EXAM: CHEST - 2 VIEW COMPARISON:  None. FINDINGS: The heart size and mediastinal contours are within normal limits. Both lungs are clear. The visualized skeletal structures are unremarkable. IMPRESSION: No active cardiopulmonary disease. Electronically Signed   By: Caryn BeeKevin  Chase Picket M.D.   On: 05/09/2019 03:41    Procedures Procedures (including critical care time)  Medications Ordered in ED Medications  sodium chloride flush (NS) 0.9 % injection 3 mL (3 mLs Intravenous Given 05/09/19 0842)  sodium chloride 0.9 % bolus 500 mL (0 mLs Intravenous Stopped 05/09/19 0926)  acetaminophen (TYLENOL) tablet 1,000 mg (1,000 mg Oral Given 05/09/19 0932)  iohexol (OMNIPAQUE) 350 MG/ML injection 100 mL (70 mLs Intravenous Contrast Given 05/09/19 0919)     Initial Impression / Assessment and Plan / ED Course  I have reviewed the triage vital signs and the nursing notes.  Pertinent labs & imaging results that were available during my care of the patient were reviewed by me and considered in my medical decision making (see chart for details).  68 year old female presenting for right-sided chest pain, weakness and near syncope, reports that yesterday evening she was walking felt lightheaded and then fell landing on her arm and chest since then has had right-sided chest pain, continues to feel weak.  She does not have focal one-sided weakness on exam no associated headache, and normal neurologic exam.  Pain on the right side is  reproducible with palpation, but is also pleuritic in nature.  She reports also started after she began coughing throughout the day yesterday, no associated fevers and no known sick contacts.  Basic labs and troponin ordered from triage, EKG without ischemic changes and initial troponin negative.  Patient does have new bradycardia into the 50s, was previously between 60-90, question if this could be contributing to near syncopal symptoms, patient does take metoprolol, beta-blockade could be causing weakness and near syncope.  No other arrhythmias noted.  Patient with no leukocytosis, stable hemoglobin, creatinine slightly worse than baseline, 1.55 today usually around 1.3, no other significant electrolyte derangements.  Urinalysis without signs of infection.  Second troponin negative as well.  Chest x-ray is clear.  Given cough coronavirus test sent.  Given cough, near syncopal episode and right-sided chest wall tenderness will get CT angio of the chest this will allow [place] to assess for PE in the setting of near syncope as well as potential rib fractures or trauma from fall as well as potential pneumonia given cough.  With orthostatics patient did not have drop in blood pressure elevated heart rate but upon standing did become weak and fall backwards without having complete syncopal episode.  I wonder if deconditioning could also be contributing patient lives alone and typically has to walk with a walker or cane at baseline but reports that she has been laying in bed most of the day recently, reports that she has no energy and motivation to do anything, does report worsening depression but no suicidal ideations.  I am concerned for patient safety alone at home given increased falls and weakness here today.  CTA without evidence of PE, there is some mild cardiomegaly with evidence of right heart dysfunction, and patient has known history of CHF.  They see some faint hazy airspace opacities which they think are  most consistent with atelectasis, although with patient's history of cough coronavirus test is pending.  No evidence of rib fractures or other musculoskeletal abnormality from fall.  Given patient's weakness with concern for near syncope will consult for medicine admission.  11:25 AM Case discussed with Dr. Madelyn Flavors with [facility] who will see and admit the patient.  Final Clinical Impressions(s) / ED Diagnoses   Final diagnoses:  Right-sided chest pain  Bradycardia  Near syncope  Fall,  initial encounter  Generalized weakness    ED Discharge Orders    None       Legrand Rams 05/09/19 1125    Raeford Razor, MD 05/09/19 1256

## 2019-05-10 ENCOUNTER — Encounter (HOSPITAL_COMMUNITY): Payer: Self-pay

## 2019-05-10 ENCOUNTER — Observation Stay (HOSPITAL_BASED_OUTPATIENT_CLINIC_OR_DEPARTMENT_OTHER): Payer: Medicare HMO

## 2019-05-10 DIAGNOSIS — R296 Repeated falls: Secondary | ICD-10-CM | POA: Diagnosis present

## 2019-05-10 DIAGNOSIS — I7300 Raynaud's syndrome without gangrene: Secondary | ICD-10-CM | POA: Diagnosis present

## 2019-05-10 DIAGNOSIS — N179 Acute kidney failure, unspecified: Secondary | ICD-10-CM | POA: Diagnosis present

## 2019-05-10 DIAGNOSIS — Z9181 History of falling: Secondary | ICD-10-CM | POA: Diagnosis not present

## 2019-05-10 DIAGNOSIS — R55 Syncope and collapse: Secondary | ICD-10-CM

## 2019-05-10 DIAGNOSIS — R001 Bradycardia, unspecified: Secondary | ICD-10-CM | POA: Diagnosis present

## 2019-05-10 DIAGNOSIS — Y92009 Unspecified place in unspecified non-institutional (private) residence as the place of occurrence of the external cause: Secondary | ICD-10-CM | POA: Diagnosis not present

## 2019-05-10 DIAGNOSIS — R0789 Other chest pain: Secondary | ICD-10-CM | POA: Diagnosis present

## 2019-05-10 DIAGNOSIS — F419 Anxiety disorder, unspecified: Secondary | ICD-10-CM | POA: Diagnosis present

## 2019-05-10 DIAGNOSIS — Z8249 Family history of ischemic heart disease and other diseases of the circulatory system: Secondary | ICD-10-CM | POA: Diagnosis not present

## 2019-05-10 DIAGNOSIS — I2721 Secondary pulmonary arterial hypertension: Secondary | ICD-10-CM | POA: Diagnosis present

## 2019-05-10 DIAGNOSIS — M797 Fibromyalgia: Secondary | ICD-10-CM | POA: Diagnosis present

## 2019-05-10 DIAGNOSIS — F329 Major depressive disorder, single episode, unspecified: Secondary | ICD-10-CM | POA: Diagnosis present

## 2019-05-10 DIAGNOSIS — Z833 Family history of diabetes mellitus: Secondary | ICD-10-CM | POA: Diagnosis not present

## 2019-05-10 DIAGNOSIS — Z79899 Other long term (current) drug therapy: Secondary | ICD-10-CM | POA: Diagnosis not present

## 2019-05-10 DIAGNOSIS — N183 Chronic kidney disease, stage 3 unspecified: Secondary | ICD-10-CM | POA: Diagnosis present

## 2019-05-10 DIAGNOSIS — I5032 Chronic diastolic (congestive) heart failure: Secondary | ICD-10-CM | POA: Diagnosis present

## 2019-05-10 DIAGNOSIS — Z20828 Contact with and (suspected) exposure to other viral communicable diseases: Secondary | ICD-10-CM | POA: Diagnosis present

## 2019-05-10 DIAGNOSIS — Z7982 Long term (current) use of aspirin: Secondary | ICD-10-CM | POA: Diagnosis not present

## 2019-05-10 DIAGNOSIS — Z8261 Family history of arthritis: Secondary | ICD-10-CM | POA: Diagnosis not present

## 2019-05-10 DIAGNOSIS — W010XXA Fall on same level from slipping, tripping and stumbling without subsequent striking against object, initial encounter: Secondary | ICD-10-CM | POA: Diagnosis present

## 2019-05-10 DIAGNOSIS — K219 Gastro-esophageal reflux disease without esophagitis: Secondary | ICD-10-CM | POA: Diagnosis present

## 2019-05-10 DIAGNOSIS — Z8619 Personal history of other infectious and parasitic diseases: Secondary | ICD-10-CM | POA: Diagnosis not present

## 2019-05-10 DIAGNOSIS — N289 Disorder of kidney and ureter, unspecified: Secondary | ICD-10-CM | POA: Diagnosis not present

## 2019-05-10 DIAGNOSIS — R531 Weakness: Secondary | ICD-10-CM | POA: Diagnosis present

## 2019-05-10 DIAGNOSIS — I130 Hypertensive heart and chronic kidney disease with heart failure and stage 1 through stage 4 chronic kidney disease, or unspecified chronic kidney disease: Secondary | ICD-10-CM | POA: Diagnosis present

## 2019-05-10 DIAGNOSIS — D638 Anemia in other chronic diseases classified elsewhere: Secondary | ICD-10-CM | POA: Diagnosis present

## 2019-05-10 DIAGNOSIS — R079 Chest pain, unspecified: Secondary | ICD-10-CM | POA: Diagnosis not present

## 2019-05-10 LAB — CBC
HCT: 35.5 % — ABNORMAL LOW (ref 36.0–46.0)
Hemoglobin: 11.3 g/dL — ABNORMAL LOW (ref 12.0–15.0)
MCH: 28.1 pg (ref 26.0–34.0)
MCHC: 31.8 g/dL (ref 30.0–36.0)
MCV: 88.3 fL (ref 80.0–100.0)
Platelets: 354 10*3/uL (ref 150–400)
RBC: 4.02 MIL/uL (ref 3.87–5.11)
RDW: 13.1 % (ref 11.5–15.5)
WBC: 6.1 10*3/uL (ref 4.0–10.5)
nRBC: 0 % (ref 0.0–0.2)

## 2019-05-10 LAB — BASIC METABOLIC PANEL
Anion gap: 12 (ref 5–15)
BUN: 16 mg/dL (ref 8–23)
CO2: 26 mmol/L (ref 22–32)
Calcium: 8.8 mg/dL — ABNORMAL LOW (ref 8.9–10.3)
Chloride: 103 mmol/L (ref 98–111)
Creatinine, Ser: 1.54 mg/dL — ABNORMAL HIGH (ref 0.44–1.00)
GFR calc Af Amer: 40 mL/min — ABNORMAL LOW (ref >60)
GFR calc non Af Amer: 35 mL/min — ABNORMAL LOW (ref >60)
Glucose, Bld: 108 mg/dL — ABNORMAL HIGH (ref 70–99)
Potassium: 4 mmol/L (ref 3.5–5.1)
Sodium: 141 mmol/L (ref 135–145)

## 2019-05-10 LAB — UREA NITROGEN, URINE: Urea Nitrogen, Ur: 239 mg/dL

## 2019-05-10 LAB — MRSA PCR SCREENING: MRSA by PCR: POSITIVE — AB

## 2019-05-10 LAB — BRAIN NATRIURETIC PEPTIDE: B Natriuretic Peptide: 312.3 pg/mL — ABNORMAL HIGH (ref 0.0–100.0)

## 2019-05-10 LAB — ECHOCARDIOGRAM COMPLETE
Height: 64 in
Weight: 3731.2 oz

## 2019-05-10 NOTE — Progress Notes (Signed)
  Echocardiogram 2D Echocardiogram has been performed.  Melinda [name] 05/10/2019, 10:04 AM

## 2019-05-10 NOTE — Evaluation (Addendum)
Physical Therapy Evaluation Patient Details Name: Melinda [name] MRN: [id number] DOB: 1950/07/20 Today's Date: 05/10/2019   History of Present Illness  Patient is a 68 y/o female who presents with near syncope and fall and reports chest pain. PMH includes asthma, pulmonary HTN, Chronic diastolic CHF, fibromyalgia.  Clinical Impression  Patient presents with generalized weakness, impaired balance, decreased cognition and impaired mobility s/p above. Pt lives alone in an apt and reports using rollator/furniture walker for ambulation as well as a scooter. Does not have much support at home and does report multiple falls. Pt is not the best historian/reporter of symptoms. Today, pt requires Mod A for standing transfers and for taking a few steps to get to/from chair. Noted to have complete LOB posteriorly with pt shooting backwards with head and eyes closed and needed to be lowered onto bed. Marked weakness and instability noted in BLEs. Not able to state if any dizziness/lightheadedness occurs, "not sure, maybe." Not safe to be home alone as pt is a high fall risk. Recommend OT consult in case pt decides to return home instead of SNF. Would benefit from SNF to maximize independence and mobility prior to return home. Will follow acutely.    Follow Up Recommendations SNF;Supervision for mobility/OOB;Supervision/Assistance - 24 hour    Equipment Recommendations  3in1 (PT)    Recommendations for Other Services       Precautions / Restrictions Precautions Precautions: Fall Precaution Comments: frequent falls at home Restrictions Weight Bearing Restrictions: No      Mobility  Bed Mobility Overal bed mobility: Needs Assistance Bed Mobility: Supine to Sit;Sit to Supine     Supine to sit: Supervision Sit to supine: Supervision;HOB elevated   General bed mobility comments: Able to get to EOB with use of rail, no assist needed. ABle to get into bed when echo arrived.  Transfers Overall  transfer level: Needs assistance Equipment used: Rolling walker (2 wheeled) Transfers: Sit to/from Stand Sit to Stand: Mod assist         General transfer comment: Assist to power to standing with cues for hand placement/technique and use of momentum. Posterior bias and LOB x3 without support. Stood from [employer], from chair x1.  Ambulation/Gait Ambulation/Gait assistance: Min assist Gait Distance (Feet): 3 Feet(x2 bouts) Assistive device: Rolling walker (2 wheeled) Gait Pattern/deviations: Wide base of support;Trunk flexed;Step-to pattern;Shuffle Gait velocity: decreased   General Gait Details: Able to take a few steps to get to chair with min A for balance, cues for sequencing , pt taking hands off RW and closing eyes during transfer. Very unsafe. Bil knee instability noted.  Stairs            Wheelchair Mobility    Modified Rankin (Stroke Patients Only)       Balance Overall balance assessment: History of Falls;Needs assistance Sitting-balance support: Feet supported;No upper extremity supported Sitting balance-Leahy Scale: Fair     Standing balance support: During functional activity Standing balance-Leahy Scale: Poor Standing balance comment: requires BUE support and external support due to LOB backwards with PT lowering pt back onto bed.                             Pertinent Vitals/Pain Pain Assessment: Faces Faces Pain Scale: Hurts little more Pain Location: chest from fall Pain Descriptors / Indicators: Sore;Discomfort Pain Intervention(s): Repositioned;Monitored during session    Home Living Family/patient expects to be discharged to:: Skilled nursing facility Living Arrangements: Alone Available Help  at Discharge: Available PRN/intermittently Type of Home: Apartment Home Access: Elevator     Home Layout: One level Home Equipment: Walker - 4 wheels;Electric scooter      Prior Function Level of Independence: Independent with assistive  device(s)         Comments: Uses rollator for short distances, furniture walking and scooter. Reports multiple falls.     Hand Dominance        Extremity/Trunk Assessment   Upper Extremity Assessment Upper Extremity Assessment: Defer to OT evaluation    Lower Extremity Assessment Lower Extremity Assessment: Generalized weakness    Cervical / Trunk Assessment Cervical / Trunk Assessment: Other exceptions Cervical / Trunk Exceptions: multiple back surgeries  Communication   Communication: No difficulties  Cognition Arousal/Alertness: Awake/alert Behavior During Therapy: WFL for tasks assessed/performed Overall Cognitive Status: Impaired/Different from baseline Area of Impairment: Attention;Memory;Following commands;Safety/judgement;Awareness;Problem solving                   Current Attention Level: Sustained Memory: Decreased short-term memory Following Commands: Follows one step commands with increased time Safety/Judgement: Decreased awareness of safety Awareness: Intellectual Problem Solving: Slow processing;Requires verbal cues General Comments: Pt very poor historian; repeating story different ways during session. Not able to explain symptoms prior to fall. Tangential at times. Poor safety awareness.      General Comments General comments (skin integrity, edema, etc.): VSS.    Exercises     Assessment/Plan    PT Assessment Patient needs continued PT services  PT Problem List Decreased strength;Decreased mobility;Decreased safety awareness;Decreased activity tolerance;Decreased cognition;Impaired sensation;Decreased balance;Pain       PT Treatment Interventions Therapeutic activities;Gait training;Therapeutic exercise;Patient/family education;Balance training;Functional mobility training    PT Goals (Current goals can be found in the Care Plan section)  Acute Rehab PT Goals Patient Stated Goal: to not be falling all the time PT Goal Formulation:  With patient Time For Goal Achievement: 05/24/19 Potential to Achieve Goals: Fair    Frequency Min 2X/week   Barriers to discharge Decreased caregiver support      Co-evaluation               AM-PAC PT "6 Clicks" Mobility  Outcome Measure Help needed turning from your back to your side while in a flat bed without using bedrails?: A Little Help needed moving from lying on your back to sitting on the side of a flat bed without using bedrails?: A Little Help needed moving to and from a bed to a chair (including a wheelchair)?: A Lot Help needed standing up from a chair using your arms (e.g., wheelchair or bedside chair)?: A Lot Help needed to walk in hospital room?: A Lot Help needed climbing 3-5 steps with a railing? : Total 6 Click Score: 13    End of Session Equipment Utilized During Treatment: Gait belt Activity Tolerance: Patient tolerated treatment well Patient left: in bed;with call bell/phone within reach;Other (comment)(ECHO in room) Nurse Communication: Mobility status PT Visit Diagnosis: Muscle weakness (generalized) (M62.81);Difficulty in walking, not elsewhere classified (R26.2);Pain Pain - part of body: (chest)    Time: 1025-8527 PT Time Calculation (min) (ACUTE ONLY): 34 min   Charges:   PT Evaluation $PT Eval Moderate Complexity: 1 Mod PT Treatments $Therapeutic Activity: 8-22 mins        Melinda [name], PT, DPT Acute Rehabilitation Services Pager [phone] Office [phone]      Melinda [name] 05/10/2019, 9:19 AM

## 2019-05-10 NOTE — Progress Notes (Signed)
[facility] PROGRESS NOTE  Melinda [name] ZYS:[id number] DOB: 06/07/1951 DOA: 05/09/2019 PCP: Sandrea Hughs, NP  Assessment/Plan:  Renal insufficiency superimposed on chronic kidney disease stage III: Acute on chronic. On admission BUN 20 and creatinine 1.55.  Baseline creatinine previously noted to be around 1.0. Patient reports decreased oral intake as appetite waxes and wanes. Home meds include lasix. Urine studies within the limits of normal. PO intake remains unreliable.  -hold nephrotoxins -Gentle IV fluids  -monitor urine output -Recheck creatinine in a.m.  Fall secondary with ? syncope: Patient denies any syncope symptoms. She states she has been "unsteady" on her feet for several months.  CT angiogram of the chest did not note any signs of a pulmonary embolus, but did show signs of right heart dysfunction. HR range 58-78. Not orthostatic. No signs of infection. Echo with EF 93% grade 1 diastolic dysfunction Evaluated by PT who recommend snf. TSH within limits of normal. No events on tele. -gentle IV fluids -hold lasix -monitor -sw for placement  Right-sided chest pain: Improved this am.  High-sensitivity troponins negative x2.  EKG without significant changes.  No rib fractures noted on imaging.  Suspect likely related with recent fall as pain is reproducible on physical exam.  -Voltaren gel to chest as needed for pain  Pulmonary artery hypertension, diastolic congestive heart failure: Patient does not appear grossly fluid overloaded. Noted to have moderate pulmonary artery hypertension per echocardiogram in 2018 with EF noted to be 55 to 60% with grade 2 diastolic dysfunction.. -OP follow  History of COVID-19: Diagnosed previously in 09/2018.  Repeat testing today was negative.  Essential hypertension. controlled -Continue metoprolol and furosemide  Anemia of chronic disease: Patient presents with hemoglobin 10.9 which appears near baseline of around 11. -Continue  to monitor  Anxiety and depression -Continue Elavil and Celexa   Code Status: full Family Communication: patient Disposition Plan: snf recommended   Consultants:    Procedures:  echo  Antibiotics:    HPI/Subjective:   Objective: Vitals:   05/10/19 0800 05/10/19 1150  BP: (!) 142/89 (!) 99/56  Pulse: 64 (!) 58  Resp:  18  Temp: (!) 97.3 F (36.3 C) 98 F (36.7 C)  SpO2: 100% 99%    Intake/Output Summary (Last 24 hours) at 05/10/2019 1417 Last data filed at 05/10/2019 1151 Gross per 24 hour  Intake 1254.17 ml  Output 2150 ml  Net -895.83 ml   Filed Weights   05/09/19 0840 05/10/19 0600  Weight: 106.6 kg 105.8 kg    Exam:   General:  Awake alert no acute distress   Cardiovascular: rrr no mgr no LE edema  Respiratory: normal effort BS distant but clear no wheeze  Abdomen: obese soft +BS throughout. No tenderness to palpation  Musculoskeletal: joints without swelling/erythema   Data Reviewed: Basic Metabolic Panel: Recent Labs  Lab 05/09/19 0328 05/10/19 0411  NA 138 141  K 3.7 4.0  CL 102 103  CO2 27 26  GLUCOSE 100* 108*  BUN 20 16  CREATININE 1.55* 1.54*  CALCIUM 8.7* 8.8*   Liver Function Tests: No results for input(s): AST, ALT, ALKPHOS, BILITOT, PROT, ALBUMIN in the last 168 hours. No results for input(s): LIPASE, AMYLASE in the last 168 hours. No results for input(s): AMMONIA in the last 168 hours. CBC: Recent Labs  Lab 05/09/19 0328 05/10/19 0411  WBC 6.8 6.1  HGB 10.9* 11.3*  HCT 35.5* 35.5*  MCV 91.0 88.3  PLT 351 354   Cardiac Enzymes: No results for  input(s): CKTOTAL, CKMB, CKMBINDEX, TROPONINI in the last 168 hours. BNP (last 3 results) Recent Labs    10/06/18 0319 05/10/19 0411  BNP 34.6 312.3*    ProBNP (last 3 results) No results for input(s): PROBNP in the last 8760 hours.  CBG: No results for input(s): GLUCAP in the last 168 hours.  Recent Results (from the past 240 hour(s))  SARS CORONAVIRUS  2 (TAT 6-24 HRS) Nasopharyngeal Nasopharyngeal Swab     Status: None   Collection Time: 05/09/19  8:40 AM   Specimen: Nasopharyngeal Swab  Result Value Ref Range Status   SARS Coronavirus 2 NEGATIVE NEGATIVE Final    Comment: (NOTE) SARS-CoV-2 target nucleic acids are NOT DETECTED. The SARS-CoV-2 RNA is generally detectable in upper and lower respiratory specimens during the acute phase of infection. Negative results do not preclude SARS-CoV-2 infection, do not rule out co-infections with other pathogens, and should not be used as the sole basis for treatment or other patient management decisions. Negative results must be combined with clinical observations, patient history, and epidemiological information. The expected result is Negative. Fact Sheet for Patients: HairSlick.no Fact Sheet for Healthcare Providers: quierodirigir.com This test is not yet approved or cleared by the [place] FDA and  has been authorized for detection and/or diagnosis of SARS-CoV-2 by FDA under an Emergency Use Authorization (EUA). This EUA will remain  in effect (meaning this test can be used) for the duration of the COVID-19 declaration under Section 56 4(b)(1) of the Act, 21 U.S.C. section 360bbb-3(b)(1), unless the authorization is terminated or revoked sooner. Performed at [facility], [address], [place], [place] [address]   MRSA PCR Screening     Status: Abnormal   Collection Time: 05/10/19  5:53 AM   Specimen: Nasopharyngeal  Result Value Ref Range Status   MRSA by PCR POSITIVE (A) NEGATIVE Final    Comment:        The GeneXpert MRSA Assay (FDA approved for NASAL specimens only), is one component of a comprehensive MRSA colonization surveillance program. It is not intended to diagnose MRSA infection nor to guide or monitor treatment for MRSA infections. RESULT CALLED TO, READ BACK BY AND VERIFIED WITH: K.BEACER RN AT  0740 05/10/2019 BY A.DAVIS Performed at [facility], [address], [place], [place] [address]      Studies: Dg Chest 2 View  Result Date: 05/09/2019 CLINICAL DATA:  Chest pain EXAM: CHEST - 2 VIEW COMPARISON:  None. FINDINGS: The heart size and mediastinal contours are within normal limits. Both lungs are clear. The visualized skeletal structures are unremarkable. IMPRESSION: No active cardiopulmonary disease. Electronically Signed   By: Deatra Robinson M.D.   On: 05/09/2019 03:41   Ct Angio Chest Pe W And/or Wo Contrast  Result Date: 05/09/2019 CLINICAL DATA:  68 year old female with right-sided chest pain. EXAM: CT ANGIOGRAPHY CHEST WITH CONTRAST TECHNIQUE: Multidetector CT imaging of the chest was performed using the standard protocol during bolus administration of intravenous contrast. Multiplanar CT image reconstructions and MIPs were obtained to evaluate the vascular anatomy. CONTRAST:  40mL OMNIPAQUE IOHEXOL 350 MG/ML SOLN COMPARISON:  Chest radiograph dated 05/09/2019 FINDINGS: Cardiovascular: There is mild cardiomegaly. No pericardial effusion. There is retrograde flow of contrast from the right atrium into the IVC indicative of a degree of right heart dysfunction. Correlation with echocardiogram recommended. Mild atherosclerotic calcification of the aortic arch. There is no CT evidence of pulmonary embolism. Mediastinum/Nodes: No hilar or mediastinal adenopathy. The esophagus is grossly unremarkable.  No mediastinal fluid collection. Lungs/Pleura: Patchy areas of hazy airspace density may represent atelectatic changes or areas of air trapping related to underlying small airways versus small vessel disease. Linear atelectasis/scarring noted in the lingula and left lower lobe. There is no focal consolidation, pleural effusion, or pneumothorax. There is tracheal malacia. The central airways are patent. Upper Abdomen: Small scattered calcified hepatic granuloma. Musculoskeletal: No chest wall  abnormality. No acute or significant osseous findings. Review of the MIP images confirms the above findings. IMPRESSION: 1. No acute intrathoracic pathology. No CT evidence of pulmonary embolism. 2. Mild cardiomegaly with evidence of right heart dysfunction. Correlation with echocardiogram recommended. 3. Aortic Atherosclerosis (ICD10-I70.0). Electronically Signed   By: Elgie Collard M.D.   On: 05/09/2019 09:35    Scheduled Meds: . amitriptyline  150 mg Oral QHS  . aspirin EC  81 mg Oral Daily  . citalopram  20 mg Oral Daily  . diclofenac sodium  2 g Topical QID  . enoxaparin (LOVENOX) injection  40 mg Subcutaneous Q24H  . famotidine  20 mg Oral QHS  . furosemide  40 mg Oral Daily  . gabapentin  1,200 mg Oral 2 times per day  . gabapentin  800 mg Oral QHS  . loratadine  10 mg Oral Daily  . metoprolol tartrate  50 mg Oral BID   Continuous Infusions: . sodium chloride 50 mL/hr at 05/09/19 1443    Principal Problem:   Acute kidney injury [facility]) Active Problems:   Right-sided chest pain   Fall at home, initial encounter   Abnormality of gait   Chronic congestive heart failure (HCC)   Pulmonary hypertension (HCC)   Frequent falls   Anemia of chronic disease   History of 2019 novel coronavirus disease (COVID-19)    Time spent: 45 minutes    [facility] M NP  [facility]  If 7PM-7AM, please contact night-coverage at [email or web address], password [facility] 05/10/2019, 2:17 PM  LOS: 0 days

## 2019-05-11 LAB — BASIC METABOLIC PANEL
Anion gap: 10 (ref 5–15)
BUN: 19 mg/dL (ref 8–23)
CO2: 26 mmol/L (ref 22–32)
Calcium: 8.5 mg/dL — ABNORMAL LOW (ref 8.9–10.3)
Chloride: 103 mmol/L (ref 98–111)
Creatinine, Ser: 1.54 mg/dL — ABNORMAL HIGH (ref 0.44–1.00)
GFR calc Af Amer: 40 mL/min — ABNORMAL LOW (ref >60)
GFR calc non Af Amer: 35 mL/min — ABNORMAL LOW (ref >60)
Glucose, Bld: 99 mg/dL (ref 70–99)
Potassium: 4.1 mmol/L (ref 3.5–5.1)
Sodium: 139 mmol/L (ref 135–145)

## 2019-05-11 NOTE — Evaluation (Signed)
Occupational Therapy Evaluation Patient Details Name: Melinda [name] MRN: [id number] DOB: Oct 30, 1950 Today's Date: 05/11/2019    History of Present Illness Patient is a 68 y/o female who presents with near syncope and fall and reports chest pain. PMH includes asthma, pulmonary HTN, Chronic diastolic CHF, fibromyalgia.   Clinical Impression   Pt was admitted for the above.  Pt lives alone and has had several falls at home.  She has been able to perform ADLs with difficulty.  Pt had pain when she leaned forward:  She would benefit from AE education for both pain and energy conservation. She needs min A for SPT and initially was shaky.  Pt needs max A for LB adls at this time.  Will follow in acute setting with min A to min guard level goals. Recommend SNF follow up prior to home     Follow Up Recommendations  SNF    Equipment Recommendations  3 in 1 bedside commode    Recommendations for Other Services       Precautions / Restrictions Precautions Precautions: Fall Precaution Comments: frequent falls at home.  Reports 10 falls in last month Restrictions Weight Bearing Restrictions: No      Mobility Bed Mobility               General bed mobility comments: sitting eob  Transfers   Equipment used: Rolling walker (2 wheeled)   Sit to Stand: Min assist;From elevated surface Stand pivot transfers: Min assist       General transfer comment: assist to rise and steady. Steadying assist during transfer.  Cues for safety and hand placement    Balance                                           ADL either performed or assessed with clinical judgement   ADL Overall ADL's : Needs assistance/impaired Eating/Feeding: Independent   Grooming: Set up   Upper Body Bathing: Set up   Lower Body Bathing: Maximal assistance;Sit to/from stand   Upper Body Dressing : Minimal assistance   Lower Body Dressing: Maximal assistance;Sit to/from stand   Toilet  Transfer: Minimal assistance;Stand-pivot;RW(to chair)             General ADL Comments: Pt initially with shaky knees when standing.  Improved with SPT to chair. Cues for safety with hand placement as she released walker and reached one hand to chair armrest. She would benefit from AE education     Vision         Perception     Praxis      Pertinent Vitals/Pain Pain Assessment: Faces Faces Pain Scale: Hurts even more Pain Location: chest and shoulders with movement Pain Descriptors / Indicators: Sore;Discomfort Pain Intervention(s): Limited activity within patient's tolerance;Premedicated before session;Repositioned     Hand Dominance     Extremity/Trunk Assessment Upper Extremity Assessment Upper Extremity Assessment: Generalized weakness(bil shoulder limited to approximately 60 due to pain)  Good grip strength bil           Communication Communication Communication: No difficulties   Cognition Arousal/Alertness: Awake/alert Behavior During Therapy: WFL for tasks assessed/performed                       Current Attention Level: Sustained Memory: Decreased short-term memory Following Commands: Follows one step commands consistently;Follows one step commands with increased time  General Comments: easily distracted; very talkative; poor historian   General Comments       Exercises     Shoulder Instructions      Home Living Family/patient expects to be discharged to:: Skilled nursing facility Living Arrangements: Alone Available Help at Discharge: Available PRN/intermittently Type of Home: Apartment Home Access: Elevator           Bathroom Shower/Tub: Walk-in Human resources officer: Standard     Home Equipment: Environmental consultant - 4 wheels;Electric scooter          Prior Functioning/Environment Level of Independence: Independent with assistive device(s)        Comments: Uses rollator for short distances, furniture walking and  scooter. Reports multiple falls.        OT Problem List: Decreased strength;Decreased range of motion;Decreased activity tolerance;Impaired balance (sitting and/or standing);Decreased cognition;Decreased safety awareness;Pain;Decreased knowledge of use of DME or AE      OT Treatment/Interventions: Self-care/ADL training;Therapeutic exercise;DME and/or AE instruction;Energy conservation;Therapeutic activities;Cognitive remediation/compensation;Patient/family education;Balance training    OT Goals(Current goals can be found in the care plan section) Acute Rehab OT Goals Patient Stated Goal: to not be falling all the time OT Goal Formulation: With patient Time For Goal Achievement: 05/25/19 Potential to Achieve Goals: Good ADL Goals Pt Will Perform Lower Body Bathing: with min assist;sit to/from stand;with adaptive equipment Pt Will Perform Lower Body Dressing: with min assist;with adaptive equipment;sit to/from stand Pt Will Transfer to Toilet: with min guard assist;ambulating;bedside commode Pt Will Perform Toileting - Clothing Manipulation and hygiene: with min guard assist;sit to/from stand Additional ADL Goal #1: pt will perform AROM exercise program independently with written HEP Additional ADL Goal #2: pt will sustain atention to task in min distracting environment x 10 minutes with no more than 2 cues  OT Frequency: Min 2X/week   Barriers to D/C:            Co-evaluation              AM-PAC OT "6 Clicks" Daily Activity     Outcome Measure Help from another person eating meals?: None Help from another person taking care of personal grooming?: A Little Help from another person toileting, which includes using toliet, bedpan, or urinal?: A Lot Help from another person bathing (including washing, rinsing, drying)?: A Lot Help from another person to put on and taking off regular upper body clothing?: A Little Help from another person to put on and taking off regular lower  body clothing?: A Lot 6 Click Score: 16   End of Session Nurse Communication: Mobility status  Activity Tolerance: Patient tolerated treatment well Patient left: in chair;with call bell/phone within reach;with chair alarm set  OT Visit Diagnosis: Unsteadiness on feet (R26.81);Muscle weakness (generalized) (M62.81);Pain Pain - Right/Left: (bil) Pain - part of body: Shoulder                Time: 7846-9629 OT Time Calculation (min): 29 min Charges:  OT General Charges $OT Visit: 1 Visit OT Evaluation $OT Eval Moderate Complexity: 1 Mod OT Treatments $Therapeutic Exercise: 8-22 mins  Marica Otter, OTR/L Acute Rehabilitation Services [phone] [facility] pager [phone] office 05/11/2019  Jeaninne Lodico 05/11/2019, 10:43 AM

## 2019-05-11 NOTE — Progress Notes (Signed)
PROGRESS NOTE    Melinda [name]  ZOX:[id number] DOB: 27-Mar-1951 DOA: 05/09/2019 PCP: Caesar BookmanNgetich, [name] C, NP    Brief Narrative:  Melinda [name] is a 68 y.o. female with medical history significant of hypertension, CHF, pulmonary hypertension, frequent falls, fibromyalgia, COVID-19 infection in 09/2018, and chronic back pain.  She presents after having a fall at home with complaints of chest pain.  05/11/19: pt seen and examined.  She denies any chest pain shortness of breath.  She does feel weak while trying to walk which this is not new for her.  She uses the walker to ambulate.  No other new complaints.  She does report her feet is wobbly when she walks.  This is also not new.  Consultants:   none  Procedures:  Echo 05/10/2019  1. Left ventricular ejection fraction, by visual estimation, is 55%. The left ventricle has normal function. There is no left ventricular hypertrophy. Probably normal wall motion but the inferolateral wall was not well-visualized.  2. Left ventricular diastolic parameters are consistent with Grade I diastolic dysfunction (impaired relaxation).  3. Global right ventricle has normal systolic function.The right ventricular size is normal. No increase in right ventricular wall thickness.  4. Left atrial size was mild-moderately dilated.  5. Right atrial size was normal.  6. The mitral valve is normal in structure. No evidence of mitral valve regurgitation. No evidence of mitral stenosis.  7. The tricuspid valve is normal in structure. Tricuspid valve regurgitation is not demonstrated.  8. The aortic valve is tricuspid. Aortic valve regurgitation is not visualized. No evidence of aortic valve sclerosis or stenosis.  9. The inferior vena cava is normal in size with greater than 50% respiratory variability, suggesting right atrial pressure of 3 mmHg. 10. TR signal is inadequate for assessing pulmonary artery systolic pressure. Antimicrobials:   None      Objective:  Vitals:   05/11/19 0023 05/11/19 0459 05/11/19 0830 05/11/19 1208  BP: 103/64 105/67 116/67 96/74  Pulse: 66 68 64 (!) 51  Resp: 20 20 14 19   Temp: 97.8 F (36.6 C) 97.7 F (36.5 C) 97.8 F (36.6 C) 97.7 F (36.5 C)  TempSrc: Oral Oral Oral Oral  SpO2: 94% 95% 100% (!) 71%  Weight:  107.9 kg    Height:        Intake/Output Summary (Last 24 hours) at 05/11/2019 1556 Last data filed at 05/11/2019 1210 Gross per 24 hour  Intake 285.83 ml  Output 1700 ml  Net -1414.17 ml   Filed Weights   05/09/19 0840 05/10/19 0600 05/11/19 0459  Weight: 106.6 kg 105.8 kg 107.9 kg    Examination:  General exam: Appears calm and comfortable  Respiratory system: Clear to auscultation. Respiratory effort normal. Cardiovascular system: S1 & S2 heard, RRR. No JVD, murmurs, rubs, gallops or clicks. No pedal edema. Gastrointestinal system: Abdomen is nondistended, soft and nontender. No organomegaly or masses felt. Normal bowel sounds heard. Central nervous system: Alert and oriented. No focal neurological deficits. Extremities: Symmetric 5 x 5 power. Skin: No rashes, lesions or ulcers Psychiatry: Judgement and insight appear normal. Mood & affect appropriate.     Data Reviewed: I have personally reviewed following labs and imaging studies  CBC: Recent Labs  Lab 05/09/19 0328 05/10/19 0411  WBC 6.8 6.1  HGB 10.9* 11.3*  HCT 35.5* 35.5*  MCV 91.0 88.3  PLT 351 354   Basic Metabolic Panel: Recent Labs  Lab 05/09/19 0328 05/10/19 0411 05/11/19 0325  NA 138 141 139  K 3.7 4.0 4.1  CL 102 103 103  CO2 27 26 26   GLUCOSE 100* 108* 99  BUN 20 16 19   CREATININE 1.55* 1.54* 1.54*  CALCIUM 8.7* 8.8* 8.5*   GFR: Estimated Creatinine Clearance: 42.5 mL/min (A) (by C-G formula based on SCr of 1.54 mg/dL (H)). Liver Function Tests: No results for input(s): AST, ALT, ALKPHOS, BILITOT, PROT, ALBUMIN in the last 168 hours. No results for input(s): LIPASE, AMYLASE in the last 168 hours.  No results for input(s): AMMONIA in the last 168 hours. Coagulation Profile: No results for input(s): INR, PROTIME in the last 168 hours. Cardiac Enzymes: No results for input(s): CKTOTAL, CKMB, CKMBINDEX, TROPONINI in the last 168 hours. BNP (last 3 results) No results for input(s): PROBNP in the last 8760 hours. HbA1C: No results for input(s): HGBA1C in the last 72 hours. CBG: No results for input(s): GLUCAP in the last 168 hours. Lipid Profile: No results for input(s): CHOL, HDL, LDLCALC, TRIG, CHOLHDL, LDLDIRECT in the last 72 hours. Thyroid Function Tests: Recent Labs    05/09/19 1212  TSH 1.346   Anemia Panel: No results for input(s): VITAMINB12, FOLATE, FERRITIN, TIBC, IRON, RETICCTPCT in the last 72 hours. Sepsis Labs: No results for input(s): PROCALCITON, LATICACIDVEN in the last 168 hours.  Recent Results (from the past 240 hour(s))  SARS CORONAVIRUS 2 (TAT 6-24 HRS) Nasopharyngeal Nasopharyngeal Swab     Status: None   Collection Time: 05/09/19  8:40 AM   Specimen: Nasopharyngeal Swab  Result Value Ref Range Status   SARS Coronavirus 2 NEGATIVE NEGATIVE Final    Comment: (NOTE) SARS-CoV-2 target nucleic acids are NOT DETECTED. The SARS-CoV-2 RNA is generally detectable in upper and lower respiratory specimens during the acute phase of infection. Negative results do not preclude SARS-CoV-2 infection, do not rule out co-infections with other pathogens, and should not be used as the sole basis for treatment or other patient management decisions. Negative results must be combined with clinical observations, patient history, and epidemiological information. The expected result is Negative. Fact Sheet for Patients: SugarRoll.be Fact Sheet for Healthcare Providers: https://www.woods-mathews.com/ This test is not yet approved or cleared by the [place] FDA and  has been authorized for detection and/or diagnosis of  SARS-CoV-2 by FDA under an Emergency Use Authorization (EUA). This EUA will remain  in effect (meaning this test can be used) for the duration of the COVID-19 declaration under Section 56 4(b)(1) of the Act, 21 U.S.C. section 360bbb-3(b)(1), unless the authorization is terminated or revoked sooner. Performed at [facility], [place] [address], [place], [place] [address]   MRSA PCR Screening     Status: Abnormal   Collection Time: 05/10/19  5:53 AM   Specimen: Nasopharyngeal  Result Value Ref Range Status   MRSA by PCR POSITIVE (A) NEGATIVE Final    Comment:        The GeneXpert MRSA Assay (FDA approved for NASAL specimens only), is one component of a comprehensive MRSA colonization surveillance program. It is not intended to diagnose MRSA infection nor to guide or monitor treatment for MRSA infections. RESULT CALLED TO, READ BACK BY AND VERIFIED WITH: K.BEACER RN AT 0740 05/10/2019 BY A.DAVIS Performed at [facility], [place] [address], [place], [place] [address]          Radiology Studies: No results found.      Scheduled Meds: . amitriptyline  150 mg Oral QHS  . aspirin EC  81 mg Oral Daily  . citalopram  20 mg Oral Daily  . diclofenac sodium  2 g Topical QID  . enoxaparin (LOVENOX) injection  40 mg Subcutaneous Q24H  . famotidine  20 mg Oral QHS  . furosemide  40 mg Oral Daily  . gabapentin  1,200 mg Oral 2 times per day  . gabapentin  800 mg Oral QHS  . loratadine  10 mg Oral Daily  . metoprolol tartrate  50 mg Oral BID   Continuous Infusions:  Assessment & Plan:   Principal Problem:   Acute kidney injury [facility]) Active Problems:   Abnormality of gait   Right-sided chest pain   Chronic congestive heart failure (HCC)   Pulmonary hypertension (HCC)   Frequent falls   Near syncope   Fall at home, initial encounter   History of 2019 novel coronavirus disease (COVID-19)   Anemia of chronic disease  1.  Acute on chronic kidney disease-patient  was on Lasix and had decreased p.o. intake. Baseline creatinine previously noted to be around 1.3. Now creatinine stabilized, this may be her new baseline. Will monitor.  Urine studies within nml limits. Avoid nephrotoxins Was gently hydrated during this admission initially Lasix has been resumed need to monitor closely   2.Fall secondary with ?  Near syncope: Patient had previously denied syncopal symptoms.  She reported fall due to unsteady legs.  CT angiogram of the chest was previously obtained which did not note any signs of a pulmonary embolus, but did show signs of right heart dysfunction.  Negative for orthostasis Echo echo resolved as noted above  Telemetry without any events  PT recommends SNF    2.Right-sided chestpain: Doubt cardiac.  High-sensitivity troponins negative x2. EKG without significant changes.  Likely due to fall since has been reproducible -Voltaren gel to chest as needed for pain   3.Pulmonary artery hypertension, diastolic congestive heart failure: Euvolemic on exam.  Discussed with patient will likely need sleep study as outpatient.  Outpatient monitoring On Lasix since BNP was mildly elevated  We will monitor closely    4.History of COVID-19: Diagnosed previously in 09/2018. Repeat testing during this admission was negative.  5.Essential hypertension. controlled -Continue metoprolol and furosemide  6.Anemia of chronic disease: H/H at baseline -Continue to monitor  7.Anxiety and depression -Continue Elavil and Celexa      DVT prophylaxis: On Lovenox Code Status: Full code Family Communication: None at bedside Disposition Plan: SNF recommended       LOS: 1 day   Time spent: 45 minutes    Lynn Ito, MD [facility] Pager [phone]  If 7PM-7AM, please contact night-coverage [email or web address] Password [facility] 05/11/2019, 3:56 PM

## 2019-05-11 NOTE — TOC Initial Note (Addendum)
Transition of Care [facility]) - Initial/Assessment Note    Patient Details  Name: Melinda [name] MRN: [id number] Date of Birth: 11/19/1950  Transition [facility]) CM/SW Contact:    Alberteen Sam, [place] Phone Number: [phone] 05/11/2019, 10:03 AM  Clinical Narrative:                  Update: Patient reports she would like to choose [facility]. Pending insurance auth at this time.   CSW met with patient at bedside to inform of SNF recommendation by PT for short term rehab. Patient is agreeable to this plan and reports no preference for SNF at this time. Patient states she would like CSW to fax out referrals to SNFs in the [place] area and let her know of bed offers. She also states her son Montine Circle will be informed of discharge plan as he is POA.  CSW has started insurance authorization with [employer], pending bed offers at this time. Will review bed offers with patient once obtained.   Expected Discharge Plan: Skilled Nursing Facility Barriers to Discharge: Insurance Authorization   Patient Goals and CMS Choice Patient states their goals for this hospitalization and ongoing recovery are:: to go to rehab CMS Medicare.gov Compare Post Acute Care list provided to:: Patient Choice offered to / list presented to : Patient  Expected Discharge Plan and Services Expected Discharge Plan: [place] Choice: [place] arrangements for the past 2 months: Single Family Home                                      Prior Living Arrangements/Services Living arrangements for the past 2 months: Single Family Home Lives with:: Self Patient language and need for interpreter reviewed:: Yes Do you feel safe going back to the place where you live?: No   needs short term rehab  Need for Family Participation in Patient Care: Yes (Comment) Care giver support system in place?: Yes (comment)   Criminal Activity/Legal Involvement  Pertinent to Current Situation/Hospitalization: No - Comment as needed  Activities of Daily Living   ADL Screening (condition at time of admission) Patient's cognitive ability adequate to safely complete daily activities?: Yes Is the patient deaf or have difficulty hearing?: No Does the patient have difficulty seeing, even when wearing glasses/contacts?: No Does the patient have difficulty concentrating, remembering, or making decisions?: No Patient able to express need for assistance with ADLs?: Yes Does the patient have difficulty dressing or bathing?: No Independently performs ADLs?: Yes (appropriate for developmental age) Does the patient have difficulty walking or climbing stairs?: Yes Weakness of Legs: Both Weakness of Arms/Hands: None  Permission Sought/Granted Permission sought to share information with : Case Manager, Customer service manager, Family Supports Permission granted to share information with : Yes, Verbal Permission Granted  Share Information with NAME: Montine Circle  Permission granted to share info w AGENCY: SNFs  Permission granted to share info w Relationship: son (POA)  Permission granted to share info w Contact Information: [phone]  Emotional Assessment Appearance:: Appears stated age Attitude/Demeanor/Rapport: Gracious Affect (typically observed): Calm Orientation: : Oriented to Self, Oriented to Place, Oriented to  Time, Oriented to Situation Alcohol / Substance Use: Not Applicable Psych Involvement: No (comment)  Admission diagnosis:  Bradycardia [R00.1] Generalized weakness [R53.1] Near syncope [R55] Fall, initial encounter [W19.XXXA] Right-sided chest pain [R07.9] Patient Active Problem List  Diagnosis Date Noted  . Acute kidney injury ([place]) 05/10/2019  . Near syncope 05/09/2019  . Fall at home, initial encounter 05/09/2019  . History of 2019 novel coronavirus disease (COVID-19) 05/09/2019  . Anemia of chronic disease 05/09/2019  .  Acute on chronic renal insufficiency 05/09/2019  . COVID-19 virus infection 10/12/2018  . CAP (community acquired pneumonia) 10/06/2018  . Polyneuropathy in diseases classified elsewhere ([place]) 10/04/2018  . Prediabetes 09/12/2017  . High risk medication use 09/12/2017  . Frequent falls 09/12/2017  . Chronic pain syndrome 08/14/2017  . Gastroesophageal reflux disease 08/14/2017  . Cough variant asthma 03/14/2017  . Pulmonary hypertension ([place]) 03/12/2017  . Right-sided chest pain 11/11/2016  . Asthma 11/11/2016  . Atypical chest pain   . Costochondritis   . Chronic congestive heart failure ([place])   . MDD (major depressive disorder), recurrent episode ([place]) 05/23/2015  . Allergic rhinitis 11/03/2014  . Paresthesia 09/08/2014  . Peripheral neuropathy 07/14/2014  . Morbid obesity due to excess calories ([place]) 07/14/2014  . Pain in limb 07/13/2014  . Abnormality of gait 07/13/2014  . Fibromyalgia 06/13/2014  . Spinal stenosis 01/27/2013  . HTN (hypertension) 11/30/2012   PCP:  Sandrea Hughs, NP Pharmacy:   [id number] [address] [id number]: [phone]: [phone] [place]Phone: [phone]: [phone]    Social Determinants of Health (SDOH) Interventions    Readmission Risk Interventions No flowsheet data found.

## 2019-05-11 NOTE — NC FL2 (Signed)
[place] MEDICAID FL2 LEVEL OF CARE SCREENING TOOL     IDENTIFICATION  Patient Name: Melinda [name] Birthdate: Jan 05, 1951 Sex: female Admission Date (Current Location): 05/09/2019  [facility] and [place] Number:  Herbalist and Address:  [facility] [place], [place] [address], [place], [place] [address]      Provider Number: [id number]  Attending Physician Name and Address:  Nolberto Hanlon, MD  Relative Name and Phone Number:  Derrik (son) [phone]    Current Level of Care: Hospital Recommended Level of Care: [place] Prior Approval Number:    Date Approved/Denied:   PASRR Number: [id number]  Discharge Plan: SNF    Current Diagnoses: Patient Active Problem List   Diagnosis Date Noted  . Acute kidney injury ([place]) 05/10/2019  . Near syncope 05/09/2019  . Fall at home, initial encounter 05/09/2019  . History of 2019 novel coronavirus disease (COVID-19) 05/09/2019  . Anemia of chronic disease 05/09/2019  . Acute on chronic renal insufficiency 05/09/2019  . COVID-19 virus infection 10/12/2018  . CAP (community acquired pneumonia) 10/06/2018  . Polyneuropathy in diseases classified elsewhere ([place]) 10/04/2018  . Prediabetes 09/12/2017  . High risk medication use 09/12/2017  . Frequent falls 09/12/2017  . Chronic pain syndrome 08/14/2017  . Gastroesophageal reflux disease 08/14/2017  . Cough variant asthma 03/14/2017  . Pulmonary hypertension ([place]) 03/12/2017  . Right-sided chest pain 11/11/2016  . Asthma 11/11/2016  . Atypical chest pain   . Costochondritis   . Chronic congestive heart failure ([place])   . MDD (major depressive disorder), recurrent episode ([place]) 05/23/2015  . Allergic rhinitis 11/03/2014  . Paresthesia 09/08/2014  . Peripheral neuropathy 07/14/2014  . Morbid obesity due to excess calories ([place]) 07/14/2014  . Pain in limb 07/13/2014  . Abnormality of gait 07/13/2014  . Fibromyalgia 06/13/2014  . Spinal  stenosis 01/27/2013  . HTN (hypertension) 11/30/2012    Orientation RESPIRATION BLADDER Height & Weight     Self, Situation, Place, Time  Normal Continent, External catheter Weight: 237 lb 14.4 oz (107.9 kg) Height:  5\' 4"  (162.6 cm)  BEHAVIORAL SYMPTOMS/MOOD NEUROLOGICAL BOWEL NUTRITION STATUS      Continent Diet(see discharge summary)  AMBULATORY STATUS COMMUNICATION OF NEEDS Skin   Limited Assist Verbally Normal                       Personal Care Assistance Level of Assistance  Bathing, Dressing, Total care, Feeding Bathing Assistance: Limited assistance Feeding assistance: Independent Dressing Assistance: Limited assistance     Functional Limitations Info  Sight, Speech, Hearing Sight Info: Adequate Hearing Info: Adequate Speech Info: Adequate    SPECIAL CARE FACTORS FREQUENCY  PT (By licensed PT), OT (By licensed OT)     PT Frequency: min 5x weekly OT Frequency: min 5x weekly            Contractures Contractures Info: Not present    Additional Factors Info  Code Status, Allergies Code Status Info: full Allergies Info: No Known Allergies           Current Medications (05/11/2019):  This is the current hospital active medication list Current Facility-Administered Medications  Medication Dose Route Frequency Provider Last Rate Last Dose  . acetaminophen (TYLENOL) tablet 650 mg  650 mg Oral Q6H PRN Fuller Plan A, MD       Or  . acetaminophen (TYLENOL) suppository 650 mg  650 mg Rectal Q6H PRN Norval Morton, MD      .  albuterol (PROVENTIL) (2.5 MG/3ML) 0.083% nebulizer solution 2.5 mg  2.5 mg Nebulization Q6H PRN Katrinka Blazing, Rondell A, MD      . amitriptyline (ELAVIL) tablet 150 mg  150 mg Oral QHS Smith, Rondell A, MD   150 mg at 05/10/19 2209  . aspirin EC tablet 81 mg  81 mg Oral Daily Madelyn Flavors A, MD   81 mg at 05/11/19 0936  . citalopram (CELEXA) tablet 20 mg  20 mg Oral Daily Madelyn Flavors A, MD   20 mg at 05/11/19 0936  . diclofenac  sodium (VOLTAREN) 1 % transdermal gel 2 g  2 g Topical QID Madelyn Flavors A, MD   2 g at 05/11/19 0937  . enoxaparin (LOVENOX) injection 40 mg  40 mg Subcutaneous Q24H Smith, Rondell A, MD   40 mg at 05/10/19 1433  . famotidine (PEPCID) tablet 20 mg  20 mg Oral QHS Smith, Rondell A, MD   20 mg at 05/10/19 2138  . furosemide (LASIX) tablet 40 mg  40 mg Oral Daily Madelyn Flavors A, MD   40 mg at 05/11/19 0936  . gabapentin (NEURONTIN) capsule 1,200 mg  1,200 mg Oral 2 times per day Madelyn Flavors A, MD   1,200 mg at 05/11/19 0936  . gabapentin (NEURONTIN) capsule 800 mg  800 mg Oral QHS Smith, Rondell A, MD   800 mg at 05/10/19 2138  . guaiFENesin (MUCINEX) 12 hr tablet 1,200 mg  1,200 mg Oral BID PRN Madelyn Flavors A, MD      . loratadine (CLARITIN) tablet 10 mg  10 mg Oral Daily Madelyn Flavors A, MD   10 mg at 05/11/19 0936  . metoprolol tartrate (LOPRESSOR) tablet 50 mg  50 mg Oral BID Madelyn Flavors A, MD   50 mg at 05/11/19 0936  . ondansetron (ZOFRAN) tablet 4 mg  4 mg Oral Q6H PRN Madelyn Flavors A, MD       Or  . ondansetron (ZOFRAN) injection 4 mg  4 mg Intravenous Q6H PRN Smith, Rondell A, MD      . oxyCODONE (Oxy IR/ROXICODONE) immediate release tablet 15 mg  15 mg Oral Q6H PRN Smith, Rondell A, MD      . polyvinyl alcohol (LIQUIFILM TEARS) 1.4 % ophthalmic solution 2 drop  2 drop Both Eyes PRN Katrinka Blazing, Rondell A, MD      . SUMAtriptan (IMITREX) tablet 25 mg  25 mg Oral Q2H PRN Madelyn Flavors A, MD   25 mg at 05/09/19 2156     Discharge Medications: Please see discharge summary for a list of discharge medications.  Relevant Imaging Results:  Relevant Lab Results:   Additional Information SSN: [id number]  Gildardo Griffes, LCSW

## 2019-05-12 NOTE — TOC Progression Note (Signed)
Transition of Care [facility]) - Progression Note    Patient Details  Name: Melinda [name] MRN: [id number] Date of Birth: January 08, 1951  Transition of Care [facility]) CM/SW [place], [place] Phone Number: [phone] 05/12/2019, 1:32 PM  Clinical Narrative:     [facility] insurance authorization through [facility] still pending at this time.   Expected Discharge Plan: Skilled Nursing Facility Barriers to Discharge: Insurance Authorization  Expected Discharge Plan and Services Expected Discharge Plan: [place] Choice: [place] arrangements for the past 2 months: Single Family Home                                       Social Determinants of Health (SDOH) Interventions    Readmission Risk Interventions No flowsheet data found.

## 2019-05-12 NOTE — Progress Notes (Signed)
PROGRESS NOTE    Melinda [name]  OEV:[id number] DOB: Sep 26, 1950 DOA: 05/09/2019 PCP: Melinda Bookman, NP    Brief Narrative:  Melinda [name] a 68 y.o.femalewith medical history significant ofhypertension, CHF, pulmonary hypertension, frequent falls, fibromyalgia,COVID-19 infection in 09/2018,and chronic back pain. She presents after having a fall at home with complaints of chest pain.  Subjective: 05/11/19: pt seen and examined.  She denies any chest pain shortness of breath.  She does feel weak while trying to walk which this is not new for her.  She uses the walker to ambulate.  No other new complaints.  She does report her feet is wobbly when she walks.  This is also not new.  05/12/2019: Patient seen and examined.  She has no new complaints.  Feeling well.  Still with reproducible right-sided chest pain where she is palpating herself reporting soreness.  No shortness of breath.  Consultants:   None  Procedures:  Echo 05/10/2019 1. Left ventricular ejection fraction, by visual estimation, is 55%. The left ventricle has normal function. There is no left ventricular hypertrophy. Probably normal wall motion but the inferolateral wall was not well-visualized. 2. Left ventricular diastolic parameters are consistent with Grade I diastolic dysfunction (impaired relaxation). 3. Global right ventricle has normal systolic function.The right ventricular size is normal. No increase in right ventricular wall thickness. 4. Left atrial size was mild-moderately dilated. 5. Right atrial size was normal. 6. The mitral valve is normal in structure. No evidence of mitral valve regurgitation. No evidence of mitral stenosis. 7. The tricuspid valve is normal in structure. Tricuspid valve regurgitation is not demonstrated. 8. The aortic valve is tricuspid. Aortic valve regurgitation is not visualized. No evidence of aortic valve sclerosis or stenosis. 9. The inferior vena cava is normal in  size with greater than 50% respiratory variability, suggesting right atrial pressure of 3 mmHg. 10. TR signal is inadequate for assessing pulmonary artery systolic pressure.   Antimicrobials:   None     Objective: Vitals:   05/11/19 1656 05/11/19 2025 05/12/19 0612 05/12/19 0659  BP: (!) 99/49 95/79 (!) 145/79   Pulse: 61 67 68   Resp: 16 16 18    Temp: 98 F (36.7 C) 97.8 F (36.6 C) 97.9 F (36.6 C)   TempSrc: Oral Oral Oral   SpO2: 93% 100% 100%   Weight:    107.9 kg  Height:        Intake/Output Summary (Last 24 hours) at 05/12/2019 0915 Last data filed at 05/12/2019 13/05/2019 Gross per 24 hour  Intake 840 ml  Output 1150 ml  Net -310 ml   Filed Weights   05/10/19 0600 05/11/19 0459 05/12/19 0659  Weight: 105.8 kg 107.9 kg 107.9 kg    Examination:  General exam: Appears calm and comfortable  Respiratory system: Clear to auscultation. Respiratory effort normal. Cardiovascular system: S1 & S2 heard, RRR.  No murmurs, rubs, gallops or clicks.  Gastrointestinal system: Abdomen is nondistended, soft and nontender.  Normal bowel sounds heard. Central nervous system: Alert and oriented. No focal neurological deficits. Extremities: No edema Skin: Warm and dry Psychiatry: Judgement and insight appear normal. Mood & affect appropriate.     Data Reviewed: I have personally reviewed following labs and imaging studies  CBC: Recent Labs  Lab 05/09/19 0328 05/10/19 0411  WBC 6.8 6.1  HGB 10.9* 11.3*  HCT 35.5* 35.5*  MCV 91.0 88.3  PLT 351 354   Basic Metabolic Panel: Recent Labs  Lab 05/09/19 0328 05/10/19 0411 05/11/19 0325  NA 138 141 139  K 3.7 4.0 4.1  CL 102 103 103  CO2 27 26 26   GLUCOSE 100* 108* 99  BUN 20 16 19   CREATININE 1.55* 1.54* 1.54*  CALCIUM 8.7* 8.8* 8.5*   GFR: Estimated Creatinine Clearance: 42.5 mL/min (A) (by C-G formula based on SCr of 1.54 mg/dL (H)). Liver Function Tests: No results for input(s): AST, ALT, ALKPHOS, BILITOT,  PROT, ALBUMIN in the last 168 hours. No results for input(s): LIPASE, AMYLASE in the last 168 hours. No results for input(s): AMMONIA in the last 168 hours. Coagulation Profile: No results for input(s): INR, PROTIME in the last 168 hours. Cardiac Enzymes: No results for input(s): CKTOTAL, CKMB, CKMBINDEX, TROPONINI in the last 168 hours. BNP (last 3 results) No results for input(s): PROBNP in the last 8760 hours. HbA1C: No results for input(s): HGBA1C in the last 72 hours. CBG: No results for input(s): GLUCAP in the last 168 hours. Lipid Profile: No results for input(s): CHOL, HDL, LDLCALC, TRIG, CHOLHDL, LDLDIRECT in the last 72 hours. Thyroid Function Tests: Recent Labs    05/09/19 1212  TSH 1.346   Anemia Panel: No results for input(s): VITAMINB12, FOLATE, FERRITIN, TIBC, IRON, RETICCTPCT in the last 72 hours. Sepsis Labs: No results for input(s): PROCALCITON, LATICACIDVEN in the last 168 hours.  Recent Results (from the past 240 hour(s))  SARS CORONAVIRUS 2 (TAT 6-24 HRS) Nasopharyngeal Nasopharyngeal Swab     Status: None   Collection Time: 05/09/19  8:40 AM   Specimen: Nasopharyngeal Swab  Result Value Ref Range Status   SARS Coronavirus 2 NEGATIVE NEGATIVE Final    Comment: (NOTE) SARS-CoV-2 target nucleic acids are NOT DETECTED. The SARS-CoV-2 RNA is generally detectable in upper and lower respiratory specimens during the acute phase of infection. Negative results do not preclude SARS-CoV-2 infection, do not rule out co-infections with other pathogens, and should not be used as the sole basis for treatment or other patient management decisions. Negative results must be combined with clinical observations, patient history, and epidemiological information. The expected result is Negative. Fact Sheet for Patients: HairSlick.nohttps://www.fda.gov/media/138098/download Fact Sheet for Healthcare Providers: quierodirigir.comhttps://www.fda.gov/media/138095/download This test is not yet approved or  cleared by the [place] FDA and  has been authorized for detection and/or diagnosis of SARS-CoV-2 by FDA under an Emergency Use Authorization (EUA). This EUA will remain  in effect (meaning this test can be used) for the duration of the COVID-19 declaration under Section 56 4(b)(1) of the Act, 21 U.S.C. section 360bbb-3(b)(1), unless the authorization is terminated or revoked sooner. Performed at [facility], [address] [place], [place] [address]   MRSA PCR Screening     Status: Abnormal   Collection Time: 05/10/19  5:53 AM   Specimen: Nasopharyngeal  Result Value Ref Range Status   MRSA by PCR POSITIVE (A) NEGATIVE Final    Comment:        The GeneXpert MRSA Assay (FDA approved for NASAL specimens only), is one component of a comprehensive MRSA colonization surveillance program. It is not intended to diagnose MRSA infection nor to guide or monitor treatment for MRSA infections. RESULT CALLED TO, READ BACK BY AND VERIFIED WITH: K.BEACER RN AT 0740 05/10/2019 BY A.DAVIS Performed at [facility], [address] [place], [place] [address]          Radiology Studies: No results found.      Scheduled Meds: . amitriptyline  150 mg Oral QHS  . aspirin EC  81 mg  Oral Daily  . citalopram  20 mg Oral Daily  . diclofenac sodium  2 g Topical QID  . enoxaparin (LOVENOX) injection  40 mg Subcutaneous Q24H  . famotidine  20 mg Oral QHS  . furosemide  40 mg Oral Daily  . gabapentin  1,200 mg Oral 2 times per day  . gabapentin  800 mg Oral QHS  . loratadine  10 mg Oral Daily  . metoprolol tartrate  50 mg Oral BID   Continuous Infusions:  Assessment & Plan:   Principal Problem:   Acute kidney injury [facility]) Active Problems:   Abnormality of gait   Right-sided chest pain   Chronic congestive heart failure ([place])   Pulmonary hypertension (HCC)   Frequent falls   Near syncope   Fall at home, initial encounter   History of 2019 novel coronavirus  disease (COVID-19)   Anemia of chronic disease   1.  Acute on chronic kidney disease-patient was on Lasix and had decreased p.o. intake. Baseline creatinine previously noted to be around 1.3. Now creatinine stabilized, this may be her new baseline. Will monitor.  Urine studies within nml limits. Avoid nephrotoxins Was gently hydrated during this admission initially Lasix has been resumed Will need close monitoring   2.Fall secondarywith ?  Nearsyncope: Patient had previously denied syncopal symptoms.  She reported fall due to unsteady legs.  CT angiogram of the chest was previously obtained which did not note any signs of a pulmonary embolus, but did show signs of right heart dysfunction. Negative for orthostasis Echo  as noted above  Telemetry without any events  PT recommends SNF    2.Right-sided chestpain:Doubt cardiac. High-sensitivity troponins negative x2. EKG without significant changes.  Likely due to musculoskeletal from fall since it is reproducible -Voltaren gel to chest as needed for pain   3.Pulmonary artery hypertension, diastolic congestive heart failure: Euvolemic on exam.  Discussed with patient will likely need sleep study as outpatient.  Outpatient monitoring On Lasix since BNP was mildly elevated  Ck am labs    4.History of COVID-19: Diagnosed previously in 09/2018. Repeat testing during this admission was negative.  5.Essential hypertension. controlled -Continue metoprolol and furosemide  6.Anemia of chronic disease: H/H at baseline -Continue to monitor  7.Anxiety and depression -Continue Elavil and Celexa    DVT prophylaxis: On Lovenox Code Status: Full code Family Communication: None at bedside Disposition Plan: SNF pending- awaiting ins. auth        LOS: 2 days   Time spent: 45 minutes with more than 50% COC    Nolberto Hanlon, MD [facility] Pager [phone]  If 7PM-7AM, please contact  night-coverage [email or web address] Password [email or web address] 05/12/2019, 9:15 AM

## 2019-05-13 ENCOUNTER — Inpatient Hospital Stay (HOSPITAL_COMMUNITY): Payer: Medicare HMO

## 2019-05-13 LAB — BASIC METABOLIC PANEL
Anion gap: 8 (ref 5–15)
BUN: 20 mg/dL (ref 8–23)
CO2: 28 mmol/L (ref 22–32)
Calcium: 8.8 mg/dL — ABNORMAL LOW (ref 8.9–10.3)
Chloride: 101 mmol/L (ref 98–111)
Creatinine, Ser: 1.55 mg/dL — ABNORMAL HIGH (ref 0.44–1.00)
GFR calc Af Amer: 40 mL/min — ABNORMAL LOW (ref >60)
GFR calc non Af Amer: 34 mL/min — ABNORMAL LOW (ref >60)
Glucose, Bld: 95 mg/dL (ref 70–99)
Potassium: 3.9 mmol/L (ref 3.5–5.1)
Sodium: 137 mmol/L (ref 135–145)

## 2019-05-13 NOTE — TOC Progression Note (Signed)
Transition of Care [facility]) - Progression Note    Patient Details  Name: Melinda [name] MRN: [id number] Date of Birth: 06/05/1951  Transition of Care [facility]) CM/SW [place], [place] Phone Number: [phone] 05/13/2019, 4:18 PM  Clinical Narrative:     CSW informed by RN that after her fall today patient is now agreeable to go to SNF. CSW called [facility] who reported they had already cancelled patient's [name] authorization. CSW called [employer] case manager [name] at [phone] at [phone] and lvm to see if insurance can be reinstated.   Potential barriers to discharge include insurance closed over the weekend. Second barriers include [facility] gave away patient's bed and report no current beds available until next week. They will update CSW if anything changes. CSW reached out to second bed offer of [facility] and lvm, awaiting call back.   Expected Discharge Plan: Skilled Nursing Facility Barriers to Discharge: Insurance Authorization  Expected Discharge Plan and Services Expected Discharge Plan: [place] Choice: [place] arrangements for the past 2 months: Single Family Home                                       Social Determinants of Health (SDOH) Interventions    Readmission Risk Interventions No flowsheet data found.

## 2019-05-13 NOTE — Care Management Important Message (Signed)
Important Message  Patient Details  Name: Melinda [name] MRN: [id number] Date of Birth: 1951-04-05   Medicare Important Message Given:  Yes     Shelda Altes 05/13/2019, 2:49 PM

## 2019-05-13 NOTE — Progress Notes (Signed)
PROGRESS NOTE    Melinda ClinesDebra [name]  BJY:[id number] DOB: 1950-11-29 DOA: 05/09/2019 PCP: Caesar BookmanNgetich, [name] C, NP    Brief Narrative:  Melinda [name] a 68 y.o.femalewith medical history significant ofhypertension, CHF, pulmonary hypertension, frequent falls, fibromyalgia,COVID-19 infection in 09/2018,and chronic back pain. She presents after having a fall at home with complaints of chest pain.  Subjective: 05/11/19: ptseen and examined. She denies any chest pain shortness of breath. She does feel weak while trying to walk which this is not new for her. She uses the walker to ambulate. No other new complaints. She does report her feet is wobbly when she walks. This is also not new.  05/12/2019: Patient seen and examined.  She has no new complaints.  Feeling well.  Still with reproducible right-sided chest pain where she is palpating herself reporting soreness.  No shortness of breath.  05/13/19: Patient apparently slipped coming from the bathroom without assistance and slipped on her right hip. She fell b/c her legs are "wobbly". She does complain of right hip soreness.  Otherwise no other complaints.  She had declined going to rehab earlier but after discussion now she is willing to reconsider.  Consultants:   None  Procedures:  Echo 05/10/2019 1. Left ventricular ejection fraction, by visual estimation, is 55%. The left ventricle has normal function. There is no left ventricular hypertrophy. Probably normal wall motion but the inferolateral wall was not well-visualized. 2. Left ventricular diastolic parameters are consistent with Grade I diastolic dysfunction (impaired relaxation). 3. Global right ventricle has normal systolic function.The right ventricular size is normal. No increase in right ventricular wall thickness. 4. Left atrial size was mild-moderately dilated. 5. Right atrial size was normal. 6. The mitral valve is normal in structure. No evidence of mitral  valve regurgitation. No evidence of mitral stenosis. 7. The tricuspid valve is normal in structure. Tricuspid valve regurgitation is not demonstrated. 8. The aortic valve is tricuspid. Aortic valve regurgitation is not visualized. No evidence of aortic valve sclerosis or stenosis. 9. The inferior vena cava is normal in size with greater than 50% respiratory variability, suggesting right atrial pressure of 3 mmHg. 10. TR signal is inadequate for assessing pulmonary artery systolic pressure.   Antimicrobials:   None      Objective: Vitals:   05/13/19 0800 05/13/19 1051 05/13/19 1212 05/13/19 1611  BP: 102/73 (!) 122/58 121/69 115/60  Pulse: 72 72 65 63  Resp:  20 18 20   Temp:  98.6 F (37 C) 97.7 F (36.5 C) 98 F (36.7 C)  TempSrc:  Oral Oral Oral  SpO2:  97% 100% 100%  Weight:      Height:        Intake/Output Summary (Last 24 hours) at 05/13/2019 1842 Last data filed at 05/13/2019 1803 Gross per 24 hour  Intake 1640 ml  Output 1900 ml  Net -260 ml   Filed Weights   05/11/19 0459 05/12/19 0659 05/13/19 0500  Weight: 107.9 kg 107.9 kg 108 kg    Examination: General exam: Appears calm and comfortable  Respiratory system: Clear to auscultation. Respiratory effort normal. Cardiovascular system: S1 & S2 heard, RRR.  No murmurs, rubs, gallops or clicks.  Gastrointestinal system: Abdomen is nondistended, soft and nontender.  Normal bowel sounds heard. Central nervous system: Alert and oriented. No focal neurological deficits. Extremities: No edema Skin: Warm and dry Psychiatry: Judgement and insight appear normal. Mood & affect appropriate.      Data Reviewed: I have personally reviewed following labs and imaging studies  CBC: Recent Labs  Lab 05/09/19 0328 05/10/19 0411  WBC 6.8 6.1  HGB 10.9* 11.3*  HCT 35.5* 35.5*  MCV 91.0 88.3  PLT 351 751   Basic Metabolic Panel: Recent Labs  Lab 05/09/19 0328 05/10/19 0411 05/11/19 0325 05/13/19 0429   NA 138 141 139 137  K 3.7 4.0 4.1 3.9  CL 102 103 103 101  CO2 27 26 26 28   GLUCOSE 100* 108* 99 95  BUN 20 16 19 20   CREATININE 1.55* 1.54* 1.54* 1.55*  CALCIUM 8.7* 8.8* 8.5* 8.8*   GFR: Estimated Creatinine Clearance: 42.3 mL/min (A) (by C-G formula based on SCr of 1.55 mg/dL (H)). Liver Function Tests: No results for input(s): AST, ALT, ALKPHOS, BILITOT, PROT, ALBUMIN in the last 168 hours. No results for input(s): LIPASE, AMYLASE in the last 168 hours. No results for input(s): AMMONIA in the last 168 hours. Coagulation Profile: No results for input(s): INR, PROTIME in the last 168 hours. Cardiac Enzymes: No results for input(s): CKTOTAL, CKMB, CKMBINDEX, TROPONINI in the last 168 hours. BNP (last 3 results) No results for input(s): PROBNP in the last 8760 hours. HbA1C: No results for input(s): HGBA1C in the last 72 hours. CBG: No results for input(s): GLUCAP in the last 168 hours. Lipid Profile: No results for input(s): CHOL, HDL, LDLCALC, TRIG, CHOLHDL, LDLDIRECT in the last 72 hours. Thyroid Function Tests: No results for input(s): TSH, T4TOTAL, FREET4, T3FREE, THYROIDAB in the last 72 hours. Anemia Panel: No results for input(s): VITAMINB12, FOLATE, FERRITIN, TIBC, IRON, RETICCTPCT in the last 72 hours. Sepsis Labs: No results for input(s): PROCALCITON, LATICACIDVEN in the last 168 hours.  Recent Results (from the past 240 hour(s))  SARS CORONAVIRUS 2 (TAT 6-24 HRS) Nasopharyngeal Nasopharyngeal Swab     Status: None   Collection Time: 05/09/19  8:40 AM   Specimen: Nasopharyngeal Swab  Result Value Ref Range Status   SARS Coronavirus 2 NEGATIVE NEGATIVE Final    Comment: (NOTE) SARS-CoV-2 target nucleic acids are NOT DETECTED. The SARS-CoV-2 RNA is generally detectable in upper and lower respiratory specimens during the acute phase of infection. Negative results do not preclude SARS-CoV-2 infection, do not rule out co-infections with other pathogens, and should  not be used as the sole basis for treatment or other patient management decisions. Negative results must be combined with clinical observations, patient history, and epidemiological information. The expected result is Negative. Fact Sheet for Patients: SugarRoll.be Fact Sheet for Healthcare Providers: https://www.woods-mathews.com/ This test is not yet approved or cleared by the [place] FDA and  has been authorized for detection and/or diagnosis of SARS-CoV-2 by FDA under an Emergency Use Authorization (EUA). This EUA will remain  in effect (meaning this test can be used) for the duration of the COVID-19 declaration under Section 56 4(b)(1) of the Act, 21 U.S.C. section 360bbb-3(b)(1), unless the authorization is terminated or revoked sooner. Performed at [facility], [place] [address], [place],  [address]   MRSA PCR Screening     Status: Abnormal   Collection Time: 05/10/19  5:53 AM   Specimen: Nasopharyngeal  Result Value Ref Range Status   MRSA by PCR POSITIVE (A) NEGATIVE Final    Comment:        The GeneXpert MRSA Assay (FDA approved for NASAL specimens only), is one component of a comprehensive MRSA colonization surveillance program. It is not intended to diagnose MRSA infection nor to guide or monitor treatment for MRSA infections. RESULT CALLED TO, READ BACK BY AND  VERIFIED WITH: K.BEACER RN AT 0740 05/10/2019 BY A.DAVIS Performed at [facility], [address], [place], [place] [address]          Radiology Studies: Dg Hip Unilat With Pelvis 2-3 Views Right  Result Date: 05/13/2019 CLINICAL DATA:  Inpatient encounter for fall--c/o R hip pain EXAM: DG HIP (WITH OR WITHOUT PELVIS) 2-3V RIGHT COMPARISON:  None. FINDINGS: Hips are located. No evidence of pelvic fracture or sacral fracture. Dedicated view of the RIGHT hip demonstrates no femoral neck fracture. IMPRESSION: No evidence of pelvic fracture  or RIGHT hip fracture. Electronically Signed   By: Genevive Bi M.D.   On: 05/13/2019 15:58        Scheduled Meds: . amitriptyline  150 mg Oral QHS  . aspirin EC  81 mg Oral Daily  . citalopram  20 mg Oral Daily  . diclofenac sodium  2 g Topical QID  . enoxaparin (LOVENOX) injection  40 mg Subcutaneous Q24H  . famotidine  20 mg Oral QHS  . furosemide  40 mg Oral Daily  . gabapentin  1,200 mg Oral 2 times per day  . gabapentin  800 mg Oral QHS  . loratadine  10 mg Oral Daily  . metoprolol tartrate  50 mg Oral BID   Continuous Infusions:  Assessment & Plan:   Principal Problem:   Acute kidney injury [facility]) Active Problems:   Abnormality of gait   Right-sided chest pain   Chronic congestive heart failure (HCC)   Pulmonary hypertension (HCC)   Frequent falls   Near syncope   Fall at home, initial encounter   History of 2019 novel coronavirus disease (COVID-19)   Anemia of chronic disease  1.Acute on chronic kidney disease-patient was on Lasix and had decreased p.o. intake.Baseline creatinine previously noted to be around 1.3. Now creatinine stabilized, this may be her new baseline. Will monitor.  Urinestudieswithin nml limits. Avoidnephrotoxins Was gently hydrated during this admission initially Lasix has been resumed Renal fxn stable now   2.Fall secondarywith ?Nearsyncope: Patienthad previously denied syncopal symptoms. She reported fall due to unsteady legs.  CT angiogram of the chestwas previously obtained whichdid not note any signs of a pulmonary embolus, but did show signs of right heart dysfunction. Negative for orthostasis Echo as noted above  Telemetry without any events  PT recommends SNF Today s/p fall (slipping to floor)..> obtained hip xr..> no fx.    2.Right-sided chestpain:Doubt cardiac.High-sensitivity troponins negative x2. EKG without significant changes.  Likely due to musculoskeletal from her first fall since it  is reproducible, now improving -Voltaren gel to chestas needed for pain   3.Pulmonary artery hypertension, diastolic congestive heart failure:Euvolemic on exam.  Discussed with patient will likely need sleep study as outpatient.  Outpatient monitoring Continue with lasix      4.History of COVID-19: Diagnosed previously in 09/2018. Repeat testingduring this admissionwas negative.  5.Essential hypertension. controlled -Continue metoprolol and furosemide  6.Anemia of chronic disease:H/H at baseline -Continue to monitor  7.Anxiety and depression -Continue Elavil and Celexa    DVT prophylaxis:On Lovenox Code Status:Full code Family Communication:None at bedside Disposition Plan:Initially patient decided not to go to SNF but after she slipped on the floor and having a long discussion with her she is agreeable to go to SNF.  Have discussed this with case management and she will look into approval of insurance.      LOS: 3 days   Time spent: 45 minutes with more than 50% on COC  Lynn Ito, MD [facility] Pager [phone]  If 7PM-7AM, please contact night-coverage [email or web address] Password [facility] 05/13/2019, 6:42 PM

## 2019-05-13 NOTE — Progress Notes (Signed)
Physical Therapy Treatment Patient Details Name: Melinda [name] MRN: [id number] DOB: 03-05-1951 Today's Date: 05/13/2019    History of Present Illness Patient is a 68 y/o female who presents with near syncope and fall and reports chest pain. PMH includes asthma, pulmonary HTN, Chronic diastolic CHF, fibromyalgia.    PT Comments    Patient progressing slowly towards PT goals. Requires Mod A to stand from all surfaces with cues for hand placement/technique. Tolerated gait training with Min A for balance/safety, use of RW and close chair follow due to unpredictability. Pt with 1 episode of complete LOB requiring total A of 2 to prevent fall and lower back onto chair. High fall risk. Noted to have poor safety awareness as pt wants to return home despite level of assist needed for mobility. Continue to recommend SNF. Will follow.   Follow Up Recommendations  SNF;Supervision for mobility/OOB;Supervision/Assistance - 24 hour     Equipment Recommendations  3in1 (PT)    Recommendations for Other Services       Precautions / Restrictions Precautions Precautions: Fall Precaution Comments: frequent falls at home.  Reports 10 falls in last month Restrictions Weight Bearing Restrictions: No    Mobility  Bed Mobility Overal bed mobility: Needs Assistance Bed Mobility: Supine to Sit     Supine to sit: Supervision;HOB elevated     General bed mobility comments: No assist needed, use of rail.  Transfers Overall transfer level: Needs assistance Equipment used: Rolling walker (2 wheeled) Transfers: Sit to/from Stand Sit to Stand: Mod assist;+2 physical assistance Stand pivot transfers: Min assist       General transfer comment: Assist to power to standing with cues for hand placement/technique, stood from EOB x1, from chair x2. pt with episode of trembling through BLEs/trunk and threw herself forwards and backwards and almost fell but lowered pt down onto chair. Eyes remained closed  during session. "I don't know what happened, this is what happens at home."  Ambulation/Gait Ambulation/Gait assistance: Min assist Gait Distance (Feet): 16 Feet Assistive device: Rolling walker (2 wheeled) Gait Pattern/deviations: Trunk flexed;Step-to pattern;Step-through pattern;Narrow base of support;Decreased stride length Gait velocity: decreased   General Gait Details: Slow, unsteady gait with bil knee instability initially, narrow BoS and walking towards right side of RW, attempting to take hand off RW to reach for counter. Very unsafe.   Stairs             Wheelchair Mobility    Modified Rankin (Stroke Patients Only)       Balance Overall balance assessment: History of Falls;Needs assistance Sitting-balance support: Feet supported;No upper extremity supported Sitting balance-Leahy Scale: Fair     Standing balance support: During functional activity Standing balance-Leahy Scale: Poor Standing balance comment: requires BUE support and external support with episode of complete LOB this session.                            Cognition Arousal/Alertness: Awake/alert Behavior During Therapy: WFL for tasks assessed/performed Overall Cognitive Status: Impaired/Different from baseline Area of Impairment: Attention;Memory;Safety/judgement;Awareness                   Current Attention Level: Sustained Memory: Decreased short-term memory   Safety/Judgement: Decreased awareness of safety;Decreased awareness of deficits Awareness: Intellectual   General Comments: easily distracted; very talkative. Poor awareness of safety/deficits. Adamant about going home and not to rehab.      Exercises      General Comments General comments (skin integrity,  edema, etc.): VSS      Pertinent Vitals/Pain Pain Assessment: No/denies pain    Home Living                      Prior Function            PT Goals (current goals can now be found in the  care plan section) Progress towards PT goals: Progressing toward goals    Frequency    Min 2X/week      PT Plan Current plan remains appropriate    Co-evaluation              AM-PAC PT "6 Clicks" Mobility   Outcome Measure  Help needed turning from your back to your side while in a flat bed without using bedrails?: A Little Help needed moving from lying on your back to sitting on the side of a flat bed without using bedrails?: A Little Help needed moving to and from a bed to a chair (including a wheelchair)?: A Lot Help needed standing up from a chair using your arms (e.g., wheelchair or bedside chair)?: A Lot Help needed to walk in hospital room?: A Little Help needed climbing 3-5 steps with a railing? : Total 6 Click Score: 14    End of Session Equipment Utilized During Treatment: Gait belt Activity Tolerance: Patient tolerated treatment well Patient left: in chair;with call bell/phone within reach;with chair alarm set Nurse Communication: Mobility status PT Visit Diagnosis: Muscle weakness (generalized) (M62.81);Difficulty in walking, not elsewhere classified (R26.2)     Time: 9371-6967 PT Time Calculation (min) (ACUTE ONLY): 20 min  Charges:  $Gait Training: 8-22 mins                     Vale Haven, PT, DPT Acute Rehabilitation Services Pager [phone] Office [phone]       Blake Divine A Lanier Ensign 05/13/2019, 12:32 PM

## 2019-05-13 NOTE — Progress Notes (Signed)
12:15 This Rn was called in the pt. Room. Pt. Was found on the floor by the bathroom door. Grandson was in the room when the pt. Fell. Per grandson she wants to go to the bathroom and so he  gave her the walker.Going to the bathroom the pt. was ok  With ambulating but coming out of the bathroom she saw her slid sideways. Per pt.  Her knees gave in.  And now she is complaining of right side rib and hip  Pain 5/10 . Grandson said she did not hit hard, she just slid down.  RN's placed pt. Back on the chair and to the bed. MD was paged and said she's coming to see the pt. Will monitor accordingly.

## 2019-05-13 NOTE — TOC Progression Note (Addendum)
Transition of Care [facility]) - Progression Note    Patient Details  Name: Melinda [name] MRN: [id number] Date of Birth: [birth year]  Transition of Care [facility]) CM/SW [place], [place] Phone Number: [phone] 05/13/2019, 10:04 AM  Clinical Narrative:     Update: [facility] accepted referral for home health services.   CSW informed patient that insurance auth was received at [employer]. Patient reports that after looking at the paperwork that [facility] asked her to sign, she does no longer wish to go to SNF and would like to go home with home health. She reports using [facility] in the past, states she has a rolling walker at home and could utilize a 3 in1. States her grandson is at home for support, and she would need a taxi voucher for a ride home.   CSW has paged MD for DME orders and [facility] orders, following up with [facility] for referral for [facility], will need DME orders for [employer] to deliver 3in1 to room prior to discharge.     Expected Discharge Plan: Skilled Nursing Facility Barriers to Discharge: Insurance Authorization  Expected Discharge Plan and Services Expected Discharge Plan: [place] Choice: [place] arrangements for the past 2 months: Single Family Home                                       Social Determinants of Health (SDOH) Interventions    Readmission Risk Interventions No flowsheet data found.

## 2019-05-14 LAB — SARS CORONAVIRUS 2 (TAT 6-24 HRS): SARS Coronavirus 2: NEGATIVE

## 2019-05-14 MED ORDER — CHLORHEXIDINE GLUCONATE CLOTH 2 % EX PADS
6.0000 | MEDICATED_PAD | Freq: Every day | CUTANEOUS | Status: DC
  Administered 2019-05-14 – 2019-05-15 (×2): 6 via TOPICAL

## 2019-05-14 NOTE — Plan of Care (Signed)

## 2019-05-14 NOTE — Progress Notes (Signed)
PROGRESS NOTE    Metta ClinesDebra Fontan  RUE:[id number]:[id number] DOB: [birth year] DOA: 05/09/2019 PCP: Caesar BookmanNgetich, [name] C, NP    Brief Narrative:  Melinda GoldmannDebra Mcilwainis a 68 y.o.femalewith medical history significant ofhypertension, CHF, pulmonary hypertension, frequent falls, fibromyalgia,COVID-19 infection in 09/2018,and chronic back pain. She presents after having a fall at home with complaints of chest pain.  Subjective: 05/11/19: ptseen and examined. She denies any chest pain shortness of breath. She does feel weak while trying to walk which this is not new for her. She uses the walker to ambulate. No other new complaints. She does report her feet is wobbly when she walks. This is also not new.  05/12/2019: Patient seen and examined. She has no new complaints. Feeling well. Still with reproducible right-sided chest pain where she is palpating herself reporting soreness. No shortness of breath.  05/13/19: Patient apparently slipped coming from the bathroom without assistance and slipped on her right hip. She fell b/c her legs are "wobbly". She does complain of right hip soreness.  Otherwise no other complaints.  She had declined going to rehab earlier but after discussion now she is willing to reconsider.  05/14/19: Patient has no new complaints.  She changed her mind once again and wants to go home and after discussion she is willing to go to rehab.  As she has a high risk of falling and returning back to the hospital.  She denies any shortness of breath or chest pain.  Just soreness from where she slipped on her hip.  Consultants:  None  Procedures: Echo 05/10/2019 1. Left ventricular ejection fraction, by visual estimation, is 55%. The left ventricle has normal function. There is no left ventricular hypertrophy. Probably normal wall motion but the inferolateral wall was not well-visualized. 2. Left ventricular diastolic parameters are consistent with Grade I diastolic dysfunction  (impaired relaxation). 3. Global right ventricle has normal systolic function.The right ventricular size is normal. No increase in right ventricular wall thickness. 4. Left atrial size was mild-moderately dilated. 5. Right atrial size was normal. 6. The mitral valve is normal in structure. No evidence of mitral valve regurgitation. No evidence of mitral stenosis. 7. The tricuspid valve is normal in structure. Tricuspid valve regurgitation is not demonstrated. 8. The aortic valve is tricuspid. Aortic valve regurgitation is not visualized. No evidence of aortic valve sclerosis or stenosis. 9. The inferior vena cava is normal in size with greater than 50% respiratory variability, suggesting right atrial pressure of 3 mmHg. 10. TR signal is inadequate for assessing pulmonary artery systolic pressure.   Antimicrobials:  None      Objective: Vitals:   05/13/19 2051 05/14/19 0143 05/14/19 0355 05/14/19 0427  BP: 120/60 126/73 115/76   Pulse: 70 66 64   Resp: 20 16 20    Temp: 98.2 F (36.8 C) 97.9 F (36.6 C) 98 F (36.7 C)   TempSrc: Oral Oral Oral   SpO2: 98% 100% 95%   Weight:    107.7 kg  Height:        Intake/Output Summary (Last 24 hours) at 05/14/2019 0718 Last data filed at 05/14/2019 0650 Gross per 24 hour  Intake 1640 ml  Output 2150 ml  Net -510 ml   Filed Weights   05/12/19 0659 05/13/19 0500 05/14/19 0427  Weight: 107.9 kg 108 kg 107.7 kg     Examination: General exam:Appears calm and comfortable  Respiratory system: Clear to auscultation. Respiratory effort normal. Cardiovascular system:S1 &S2 heard, RRR. No murmurs, rubs, gallops or clicks.  Gastrointestinal system:Abdomen  is nondistended, soft and nontender. Normal bowel sounds heard. Central nervous system:Alert and oriented. No focal neurological deficits. Extremities:No edema Skin:Warm and dry Psychiatry:Judgement and insight appear normal. Mood &affect appropriate.       Data Reviewed: I have personally reviewed following labs and imaging studies  CBC: Recent Labs  Lab 05/09/19 0328 05/10/19 0411  WBC 6.8 6.1  HGB 10.9* 11.3*  HCT 35.5* 35.5*  MCV 91.0 88.3  PLT 351 354   Basic Metabolic Panel: Recent Labs  Lab 05/09/19 0328 05/10/19 0411 05/11/19 0325 05/13/19 0429  NA 138 141 139 137  K 3.7 4.0 4.1 3.9  CL 102 103 103 101  CO2 27 26 26 28   GLUCOSE 100* 108* 99 95  BUN 20 16 19 20   CREATININE 1.55* 1.54* 1.54* 1.55*  CALCIUM 8.7* 8.8* 8.5* 8.8*   GFR: Estimated Creatinine Clearance: 42.2 mL/min (A) (by C-G formula based on SCr of 1.55 mg/dL (H)). Liver Function Tests: No results for input(s): AST, ALT, ALKPHOS, BILITOT, PROT, ALBUMIN in the last 168 hours. No results for input(s): LIPASE, AMYLASE in the last 168 hours. No results for input(s): AMMONIA in the last 168 hours. Coagulation Profile: No results for input(s): INR, PROTIME in the last 168 hours. Cardiac Enzymes: No results for input(s): CKTOTAL, CKMB, CKMBINDEX, TROPONINI in the last 168 hours. BNP (last 3 results) No results for input(s): PROBNP in the last 8760 hours. HbA1C: No results for input(s): HGBA1C in the last 72 hours. CBG: No results for input(s): GLUCAP in the last 168 hours. Lipid Profile: No results for input(s): CHOL, HDL, LDLCALC, TRIG, CHOLHDL, LDLDIRECT in the last 72 hours. Thyroid Function Tests: No results for input(s): TSH, T4TOTAL, FREET4, T3FREE, THYROIDAB in the last 72 hours. Anemia Panel: No results for input(s): VITAMINB12, FOLATE, FERRITIN, TIBC, IRON, RETICCTPCT in the last 72 hours. Sepsis Labs: No results for input(s): PROCALCITON, LATICACIDVEN in the last 168 hours.  Recent Results (from the past 240 hour(s))  SARS CORONAVIRUS 2 (TAT 6-24 HRS) Nasopharyngeal Nasopharyngeal Swab     Status: None   Collection Time: 05/09/19  8:40 AM   Specimen: Nasopharyngeal Swab  Result Value Ref Range Status   SARS Coronavirus 2 NEGATIVE NEGATIVE  Final    Comment: (NOTE) SARS-CoV-2 target nucleic acids are NOT DETECTED. The SARS-CoV-2 RNA is generally detectable in upper and lower respiratory specimens during the acute phase of infection. Negative results do not preclude SARS-CoV-2 infection, do not rule out co-infections with other pathogens, and should not be used as the sole basis for treatment or other patient management decisions. Negative results must be combined with clinical observations, patient history, and epidemiological information. The expected result is Negative. Fact Sheet for Patients: HairSlick.no Fact Sheet for Healthcare Providers: quierodirigir.com This test is not yet approved or cleared by the [place] FDA and  has been authorized for detection and/or diagnosis of SARS-CoV-2 by FDA under an Emergency Use Authorization (EUA). This EUA will remain  in effect (meaning this test can be used) for the duration of the COVID-19 declaration under Section 56 4(b)(1) of the Act, 21 U.S.C. section 360bbb-3(b)(1), unless the authorization is terminated or revoked sooner. Performed at [facility], [address] [place], [place] [address]   MRSA PCR Screening     Status: Abnormal   Collection Time: 05/10/19  5:53 AM   Specimen: Nasopharyngeal  Result Value Ref Range Status   MRSA by PCR POSITIVE (A) NEGATIVE Final    Comment:  The GeneXpert MRSA Assay (FDA approved for NASAL specimens only), is one component of a comprehensive MRSA colonization surveillance program. It is not intended to diagnose MRSA infection nor to guide or monitor treatment for MRSA infections. RESULT CALLED TO, READ BACK BY AND VERIFIED WITH: K.BEACER RN AT 0740 05/10/2019 BY A.DAVIS Performed at [facility], [place] [address] [place], [place] [address]          Radiology Studies: Dg Hip Unilat With Pelvis 2-3 Views Right  Result Date: 05/13/2019  CLINICAL DATA:  Inpatient encounter for fall--c/o R hip pain EXAM: DG HIP (WITH OR WITHOUT PELVIS) 2-3V RIGHT COMPARISON:  None. FINDINGS: Hips are located. No evidence of pelvic fracture or sacral fracture. Dedicated view of the RIGHT hip demonstrates no femoral neck fracture. IMPRESSION: No evidence of pelvic fracture or RIGHT hip fracture. Electronically Signed   By: Suzy Bouchard M.D.   On: 05/13/2019 15:58        Scheduled Meds: . amitriptyline  150 mg Oral QHS  . aspirin EC  81 mg Oral Daily  . Chlorhexidine Gluconate Cloth  6 each Topical Q0600  . citalopram  20 mg Oral Daily  . diclofenac sodium  2 g Topical QID  . enoxaparin (LOVENOX) injection  40 mg Subcutaneous Q24H  . famotidine  20 mg Oral QHS  . furosemide  40 mg Oral Daily  . gabapentin  1,200 mg Oral 2 times per day  . gabapentin  800 mg Oral QHS  . loratadine  10 mg Oral Daily  . metoprolol tartrate  50 mg Oral BID   Continuous Infusions:  Assessment & Plan:   Principal Problem:   Acute kidney injury [facility]) Active Problems:   Abnormality of gait   Right-sided chest pain   Chronic congestive heart failure ([place])   Pulmonary hypertension ([facility])   Frequent falls   Near syncope   Fall at home, initial encounter   History of 2019 novel coronavirus disease (COVID-19)   Anemia of chronic disease   1.Acute on chronic kidney disease-patient was on Lasix and had decreased p.o. intake.Baseline creatinine previously noted to be around 1.3. Urinestudieswithin nml limits. Avoidnephrotoxins Was gently hydrated during this admission initially Lasix has beenresumed Renal fxn  continues to be stable as current creatinine may be her new norm   2.Fall secondarywith ?Nearsyncope: Patienthad previously denied syncopal symptoms. She reported fall due to unsteady legs.  CT angiogram of the chestwas previously obtained whichdid not note any signs of a pulmonary embolus, but did show signs of right heart  dysfunction. Negative for orthostasis Echoas noted above  Telemetry without any events   s/p fall (slipping to floor).  On 05/13/2019.> obtained hip xr..> no fx.  PT recommends SNF Covid screening test will be ordered today  2.Right-sided chestpain:Doubt cardiac.High-sensitivity troponins negative x2. EKG without ischemic changes Likely due to musculoskeletal from her first fall since it is reproducible, now improving -Voltaren gel to chestas needed for pain   3.Pulmonary artery hypertension, diastolic congestive heart failure:Euvolemic on exam.  Discussed with patient will likely need sleep study as outpatient.  Outpatient monitoring Continue with lasix    4.History of COVID-19: Diagnosed previously in 09/2018. Repeat testingduring this admissionwas negative.  5.Essential hypertension. controlled -Continue metoprolol and furosemide  6.Anemia of chronic disease:H/H at baseline -Continue to monitor  7.Anxiety and depression -Continue Elavil and Celexa    DVT prophylaxis:On Lovenox Code Status:Full code Family Communication:None at bedside Disposition Plan:Since she is patient has changed her mind couple  of times about going home versus rehab she is agreeable to finally go to rehab.  Awaiting authorization will likely be next week.  Covid screening has been ordered.    LOS: 4 days   Time spent: 45 minutes with more than 50% COC    Lynn Ito, MD [facility] Pager [phone]  If 7PM-7AM, please contact night-coverage [email or web address] Password [facility] 05/14/2019, 7:18 AM

## 2019-05-14 NOTE — TOC Progression Note (Addendum)
Transition of Care [facility]) - Progression Note    Patient Details  Name: Abigayle Wilinski MRN: [id number] Date of Birth: 1950/08/06  Transition of Care [facility]) CM/SW [place], [place] Phone Number: [phone] 05/14/2019, 8:40 AM  Clinical Narrative:     Update: Per MD, patient now wants SNF again. CSW has reached out to [facility] again today, they will start insurance authorization again, with it being the weekend unlikely discharge until Monday.[facility] will update CSW as soon as insurance auth achieved.  CSW has requested new covid test be ordered by MD in preparation of SNF discharge.     CSW spoke with patient this morning about SNF placement to inform that [facility] is able to accept and start insurance auth. Patient reports she continues to be "scared" of facilities and has thought about it overnight and would rather go home with home health. CSW reiterated to her that this would need to be her final decision as CSW has obtained SNF placement, home health, then SNF placement, and now will obtain home health for patient's discharge plan once again. Patient reports this is her final decision. Patient will be set up with [facility] again. MD has been informed of discharge plan. No further discharge needs identified at this time.   Expected Discharge Plan: Skilled Nursing Facility Barriers to Discharge: Insurance Authorization  Expected Discharge Plan and Services Expected Discharge Plan: [place] Choice: [place] arrangements for the past 2 months: Single Family Home                                       Social Determinants of Health (SDOH) Interventions    Readmission Risk Interventions No flowsheet data found.

## 2019-05-15 NOTE — Progress Notes (Signed)
Occupational Therapy Treatment Patient Details Name: Melinda [name] MRN: [id number] DOB: February 19, 1951 Today's Date: 05/15/2019    History of present illness Patient is a 67 y/o female who presents with near syncope and fall and reports chest pain. PMH includes asthma, pulmonary HTN, Chronic diastolic CHF, fibromyalgia.   OT comments  Patient progressing slowly towards OT goals. Pt completing bed mobility with supervision, transfers with min assist, LB bathing with mod assist, LB dressing with mod assist. She continues to require increased support for balance, up to mod assist while bathing at sink.  Pt agreeable to SNF, and continue to recommend SNF to maximize independence and safety with ADLs/mobility.    Follow Up Recommendations  SNF    Equipment Recommendations  3 in 1 bedside commode    Recommendations for Other Services      Precautions / Restrictions Precautions Precautions: Fall Precaution Comments: frequent falls at home.  Reports 10 falls in last month Restrictions Weight Bearing Restrictions: No       Mobility Bed Mobility Overal bed mobility: Needs Assistance Bed Mobility: Supine to Sit     Supine to sit: Supervision     General bed mobility comments: pt reaching for UE support for trunk, cueing to utilize bed rail to complete task with supervision  Transfers Overall transfer level: Needs assistance Equipment used: Rolling walker (2 wheeled) Transfers: Sit to/from [employer] Sit to Stand: Min assist Stand pivot transfers: Min assist       General transfer comment: min assist to power up from EOB, min assist to steady during pivot to recliner     Balance Overall balance assessment: History of Falls;Needs assistance Sitting-balance support: Feet supported;No upper extremity supported Sitting balance-Leahy Scale: Fair     Standing balance support: Single extremity supported;Bilateral upper extremity supported;During functional  activity Standing balance-Leahy Scale: Poor Standing balance comment: relaint on BUE support dynamically, able to engage in ADLs with min-mod assist to maintain balance at sink with 0-1 hand support                           ADL either performed or assessed with clinical judgement   ADL Overall ADL's : Needs assistance/impaired     Grooming: Set up;Sitting   Upper Body Bathing: Set up;Sitting   Lower Body Bathing: Sit to/from stand;Moderate assistance Lower Body Bathing Details (indicate cue type and reason): mod assist to maintain balance dynamically while standing at sink to complete peri care bathing  Upper Body Dressing : Set up;Sitting   Lower Body Dressing: Moderate assistance;Sit to/from stand Lower Body Dressing Details (indicate cue type and reason): able to reach to adjust socks in sitting, would require assist to don; min assist sit to stand  Toilet Transfer: Minimal assistance           Functional mobility during ADLs: Minimal assistance;Rolling walker;Cueing for safety;Cueing for sequencing       Vision       Perception     Praxis      Cognition Arousal/Alertness: Awake/alert Behavior During Therapy: WFL for tasks assessed/performed Overall Cognitive Status: Impaired/Different from baseline Area of Impairment: Memory;Safety/judgement;Following commands;Awareness;Problem solving;Attention                   Current Attention Level: Selective Memory: Decreased short-term memory Following Commands: Follows multi-step commands with increased time Safety/Judgement: Decreased awareness of safety;Decreased awareness of deficits Awareness: Emergent Problem Solving: Slow processing;Requires verbal cues General Comments: patient able to follow  multiple step commands today, increased time for processing and min cueing for problem sovling; remains with decreased safety awareness        Exercises     Shoulder Instructions       General  Comments VSS    Pertinent Vitals/ Pain       Pain Assessment: No/denies pain  Home Living                                          Prior Functioning/Environment              Frequency  Min 2X/week        Progress Toward Goals  OT Goals(current goals can now be found in the care plan section)  Progress towards OT goals: Progressing toward goals  Acute Rehab OT Goals Patient Stated Goal: to not be falling all the time OT Goal Formulation: With patient  Plan Discharge plan remains appropriate;Frequency remains appropriate    Co-evaluation                 AM-PAC OT "6 Clicks" Daily Activity     Outcome Measure   Help from another person eating meals?: None Help from another person taking care of personal grooming?: A Little Help from another person toileting, which includes using toliet, bedpan, or urinal?: A Lot Help from another person bathing (including washing, rinsing, drying)?: A Lot Help from another person to put on and taking off regular upper body clothing?: A Little Help from another person to put on and taking off regular lower body clothing?: A Lot 6 Click Score: 16    End of Session Equipment Utilized During Treatment: Rolling walker;Gait belt  OT Visit Diagnosis: Unsteadiness on feet (R26.81);Muscle weakness (generalized) (M62.81);Pain   Activity Tolerance Patient tolerated treatment well   Patient Left in chair;with call bell/phone within reach;with chair alarm set   Nurse Communication Mobility status        Time: 3151-7616 OT Time Calculation (min): 28 min  Charges: OT General Charges $OT Visit: 1 Visit OT Treatments $Self Care/Home Management : 23-37 mins  Delight Stare, [place] Pager [phone] Office [phone]    Delight Stare 05/15/2019, 4:40 PM

## 2019-05-15 NOTE — Progress Notes (Signed)
PROGRESS NOTE    Melinda [name]  ZOX:[id number] DOB: August 09, 1950 DOA: 05/09/2019 PCP: Caesar Bookman, NP    Brief Narrative:  Melinda [name] a 68 y.o.femalewith medical history significant ofhypertension, CHF, pulmonary hypertension, frequent falls, fibromyalgia,COVID-19 infection in 09/2018,and chronic back pain. She presents after having a fall at home with complaints of chest pain.  Subjective: 05/11/19: ptseen and examined. She denies any chest pain shortness of breath. She does feel weak while trying to walk which this is not new for her. She uses the walker to ambulate. No other new complaints. She does report her feet is wobbly when she walks. This is also not new.  05/12/2019: Patient seen and examined. She has no new complaints. Feeling well. Still with reproducible right-sided chest pain where she is palpating herself reporting soreness. No shortness of breath.  05/13/19:Patient apparently slipped coming from the bathroom without assistance andslippedon her right hip.She fell b/c her legs are "wobbly".She does complain of right hip soreness. Otherwise no other complaints. She had declined going to rehab earlier but after discussion now she is willing to reconsider.  05/14/19: Patient has no new complaints.  She changed her mind once again and wants to go home and after discussion she is willing to go to rehab.  As she has a high risk of falling and returning back to the hospital.  She denies any shortness of breath or chest pain.  Just soreness from where she slipped on her hip.  05/15/19-patient without any new complaints.  Denies chest pain, shortness of breath, abdominal pain.  Still willing to go to SNF.  Consultants:  None  Procedures: Echo 05/10/2019 1. Left ventricular ejection fraction, by visual estimation, is 55%. The left ventricle has normal function. There is no left ventricular hypertrophy. Probably normal wall motion but the  inferolateral wall was not well-visualized. 2. Left ventricular diastolic parameters are consistent with Grade I diastolic dysfunction (impaired relaxation). 3. Global right ventricle has normal systolic function.The right ventricular size is normal. No increase in right ventricular wall thickness. 4. Left atrial size was mild-moderately dilated. 5. Right atrial size was normal. 6. The mitral valve is normal in structure. No evidence of mitral valve regurgitation. No evidence of mitral stenosis. 7. The tricuspid valve is normal in structure. Tricuspid valve regurgitation is not demonstrated. 8. The aortic valve is tricuspid. Aortic valve regurgitation is not visualized. No evidence of aortic valve sclerosis or stenosis. 9. The inferior vena cava is normal in size with greater than 50% respiratory variability, suggesting right atrial pressure of 3 mmHg. 10. TR signal is inadequate for assessing pulmonary artery systolic pressure.   Antimicrobials:  None     Objective: Vitals:   05/14/19 0844 05/14/19 1245 05/14/19 2013 05/15/19 0334  BP: 111/61 117/71 (!) 137/93 112/79  Pulse: 65 (!) 59 61 66  Resp:  18  16  Temp:  97.6 F (36.4 C) 97.8 F (36.6 C) 98 F (36.7 C)  TempSrc:   Oral Oral  SpO2: 98% 100% 100% 97%  Weight:    107.1 kg  Height:        Intake/Output Summary (Last 24 hours) at 05/15/2019 0734 Last data filed at 05/15/2019 0600 Gross per 24 hour  Intake 1280 ml  Output 900 ml  Net 380 ml   Filed Weights   05/13/19 0500 05/14/19 0427 05/15/19 0334  Weight: 108 kg 107.7 kg 107.1 kg    Examination: General exam:Appears calm and comfortable  Respiratory system: Clear to auscultation. Respiratory effort  normal. Cardiovascular system:S1 &S2 heard, RRR. No murmurs, rubs, gallops or clicks.  Gastrointestinal system:Abdomen is nondistended, soft and nontender. Normal bowel sounds heard. Central nervous system:Alert and oriented. No focal  neurological deficits. Extremities:No edema Skin:Warm and dry Psychiatry:Judgement and insight appear normal. Mood &affect appropriate.    Data Reviewed: I have personally reviewed following labs and imaging studies  CBC: Recent Labs  Lab 05/09/19 0328 05/10/19 0411  WBC 6.8 6.1  HGB 10.9* 11.3*  HCT 35.5* 35.5*  MCV 91.0 88.3  PLT 351 354   Basic Metabolic Panel: Recent Labs  Lab 05/09/19 0328 05/10/19 0411 05/11/19 0325 05/13/19 0429  NA 138 141 139 137  K 3.7 4.0 4.1 3.9  CL 102 103 103 101  CO2 27 26 26 28   GLUCOSE 100* 108* 99 95  BUN 20 16 19 20   CREATININE 1.55* 1.54* 1.54* 1.55*  CALCIUM 8.7* 8.8* 8.5* 8.8*   GFR: Estimated Creatinine Clearance: 42.1 mL/min (A) (by C-G formula based on SCr of 1.55 mg/dL (H)). Liver Function Tests: No results for input(s): AST, ALT, ALKPHOS, BILITOT, PROT, ALBUMIN in the last 168 hours. No results for input(s): LIPASE, AMYLASE in the last 168 hours. No results for input(s): AMMONIA in the last 168 hours. Coagulation Profile: No results for input(s): INR, PROTIME in the last 168 hours. Cardiac Enzymes: No results for input(s): CKTOTAL, CKMB, CKMBINDEX, TROPONINI in the last 168 hours. BNP (last 3 results) No results for input(s): PROBNP in the last 8760 hours. HbA1C: No results for input(s): HGBA1C in the last 72 hours. CBG: No results for input(s): GLUCAP in the last 168 hours. Lipid Profile: No results for input(s): CHOL, HDL, LDLCALC, TRIG, CHOLHDL, LDLDIRECT in the last 72 hours. Thyroid Function Tests: No results for input(s): TSH, T4TOTAL, FREET4, T3FREE, THYROIDAB in the last 72 hours. Anemia Panel: No results for input(s): VITAMINB12, FOLATE, FERRITIN, TIBC, IRON, RETICCTPCT in the last 72 hours. Sepsis Labs: No results for input(s): PROCALCITON, LATICACIDVEN in the last 168 hours.  Recent Results (from the past 240 hour(s))  SARS CORONAVIRUS 2 (TAT 6-24 HRS) Nasopharyngeal Nasopharyngeal Swab     Status:  None   Collection Time: 05/09/19  8:40 AM   Specimen: Nasopharyngeal Swab  Result Value Ref Range Status   SARS Coronavirus 2 NEGATIVE NEGATIVE Final    Comment: (NOTE) SARS-CoV-2 target nucleic acids are NOT DETECTED. The SARS-CoV-2 RNA is generally detectable in upper and lower respiratory specimens during the acute phase of infection. Negative results do not preclude SARS-CoV-2 infection, do not rule out co-infections with other pathogens, and should not be used as the sole basis for treatment or other patient management decisions. Negative results must be combined with clinical observations, patient history, and epidemiological information. The expected result is Negative. Fact Sheet for Patients: HairSlick.no Fact Sheet for Healthcare Providers: quierodirigir.com This test is not yet approved or cleared by the [place] FDA and  has been authorized for detection and/or diagnosis of SARS-CoV-2 by FDA under an Emergency Use Authorization (EUA). This EUA will remain  in effect (meaning this test can be used) for the duration of the COVID-19 declaration under Section 56 4(b)(1) of the Act, 21 U.S.C. section 360bbb-3(b)(1), unless the authorization is terminated or revoked sooner. Performed at [facility], [address] [place], [place] [address]   MRSA PCR Screening     Status: Abnormal   Collection Time: 05/10/19  5:53 AM   Specimen: Nasopharyngeal  Result Value Ref Range Status   MRSA  by PCR POSITIVE (A) NEGATIVE Final    Comment:        The GeneXpert MRSA Assay (FDA approved for NASAL specimens only), is one component of a comprehensive MRSA colonization surveillance program. It is not intended to diagnose MRSA infection nor to guide or monitor treatment for MRSA infections. RESULT CALLED TO, READ BACK BY AND VERIFIED WITH: K.BEACER RN AT 0740 05/10/2019 BY A.DAVIS Performed at [facility]  Lab, [address], [place], [place] [address]   SARS CORONAVIRUS 2 (TAT 6-24 HRS) Nasopharyngeal Nasopharyngeal Swab     Status: None   Collection Time: 05/14/19 11:05 AM   Specimen: Nasopharyngeal Swab  Result Value Ref Range Status   SARS Coronavirus 2 NEGATIVE NEGATIVE Final    Comment: (NOTE) SARS-CoV-2 target nucleic acids are NOT DETECTED. The SARS-CoV-2 RNA is generally detectable in upper and lower respiratory specimens during the acute phase of infection. Negative results do not preclude SARS-CoV-2 infection, do not rule out co-infections with other pathogens, and should not be used as the sole basis for treatment or other patient management decisions. Negative results must be combined with clinical observations, patient history, and epidemiological information. The expected result is Negative. Fact Sheet for Patients: HairSlick.no Fact Sheet for Healthcare Providers: quierodirigir.com This test is not yet approved or cleared by the [place] FDA and  has been authorized for detection and/or diagnosis of SARS-CoV-2 by FDA under an Emergency Use Authorization (EUA). This EUA will remain  in effect (meaning this test can be used) for the duration of the COVID-19 declaration under Section 56 4(b)(1) of the Act, 21 U.S.C. section 360bbb-3(b)(1), unless the authorization is terminated or revoked sooner. Performed at [facility], [address], [place], [place] [address]          Radiology Studies: Dg Hip Unilat With Pelvis 2-3 Views Right  Result Date: 05/13/2019 CLINICAL DATA:  Inpatient encounter for fall--c/o R hip pain EXAM: DG HIP (WITH OR WITHOUT PELVIS) 2-3V RIGHT COMPARISON:  None. FINDINGS: Hips are located. No evidence of pelvic fracture or sacral fracture. Dedicated view of the RIGHT hip demonstrates no femoral neck fracture. IMPRESSION: No evidence of pelvic fracture or RIGHT hip fracture.  Electronically Signed   By: Genevive Bi M.D.   On: 05/13/2019 15:58        Scheduled Meds: . amitriptyline  150 mg Oral QHS  . aspirin EC  81 mg Oral Daily  . Chlorhexidine Gluconate Cloth  6 each Topical Q0600  . citalopram  20 mg Oral Daily  . diclofenac sodium  2 g Topical QID  . enoxaparin (LOVENOX) injection  40 mg Subcutaneous Q24H  . famotidine  20 mg Oral QHS  . furosemide  40 mg Oral Daily  . gabapentin  1,200 mg Oral 2 times per day  . gabapentin  800 mg Oral QHS  . loratadine  10 mg Oral Daily  . metoprolol tartrate  50 mg Oral BID   Continuous Infusions:  Assessment & Plan:   Principal Problem:   Acute kidney injury [facility]) Active Problems:   Abnormality of gait   Right-sided chest pain   Chronic congestive heart failure (HCC)   Pulmonary hypertension (HCC)   Frequent falls   Near syncope   Fall at home, initial encounter   History of 2019 novel coronavirus disease (COVID-19)   Anemia of chronic disease  1.Acute on chronic kidney disease- patient was on Lasix and had decreased p.o. intake. Baseline creatinine previously around 1.3.  Urinestudieswithin nml limits. Avoidnephrotoxins New baseline creatinine likely is 1.5 (as currently is) Was hydrated gently initially on admission  Lasix has beenresumed    2.Fall secondarywith ?Nearsyncope:  Patienthad previously denied syncopal symptoms. She reported fall due to unsteady legs.  CT angiogram of the chestwas previously obtained whichdid not note any signs of a pulmonary embolus, but did show signs of right heart dysfunction. Negative for orthostasis Echoas noted above  Telemetry without any events   s/p fall (slipping to floor) on 05/13/2019.> obtained hip xr..> no fx. PT recommends SNF.  Has changed her mind about going to SNF couple of times but now has been firmly agreeable to go. Covid screening reordered and its negative  2.Right-sided chestpain: Doubt  cardiac.High-sensitivity troponins negative x2.  EKG without ischemic changes Chest pain is reproducible ,likely  musculoskeletal in nature from her initial fall . -Voltaren gel to chestas needed for pain   3.Pulmonary artery hypertension, diastolic congestive heart failure: Euvolemic and compensated We will need sleep study as outpatient.  Outpatient monitoring Continue with lasix    4.History of COVID-19:  Diagnosed previously in 09/2018.  Repeat testingduring this admissionhas been negative.  5.Essential hypertension.  Stable and controlled -Continue metoprolol and furosemide  6.Anemia of chronic disease:H/H at baseline -Continue to monitor  7.Anxiety and depression -Continue Elavil and Celexa    DVT prophylaxis:On Lovenox Code Status:Full code Family Communication:None at bedside Disposition Plan:SNF placement, authorization pending.  Covid repeat test is negative.    LOS: 5 days   Time spent: Spent 40 minutes with more than 50% on COC    Lynn Ito, MD [facility] Pager [phone]  If 7PM-7AM, please contact night-coverage [email or web address] Password [facility] 05/15/2019, 7:34 AM

## 2019-05-15 NOTE — Plan of Care (Signed)

## 2019-05-16 NOTE — Care Management Important Message (Signed)
Important Message  Patient Details  Name: Isidora Laham MRN: [id number] Date of Birth: 01-01-1951   Medicare Important Message Given:  Yes     Shelda Altes 05/16/2019, 2:15 PM

## 2019-05-16 NOTE — Progress Notes (Signed)
PROGRESS NOTE    Melinda [name]  WUJ:[id number] DOB: 1951-03-04 DOA: 05/09/2019 PCP: Caesar Bookman, NP    Brief Narrative:  Melinda [name] a 68 y.o.femalewith medical history significant ofhypertension, CHF, pulmonary hypertension, frequent falls, fibromyalgia,COVID-19 infection in 09/2018,and chronic back pain. She presents after having a fall at home with complaints of chest pain.  Subjective: 05/11/19: ptseen and examined. She denies any chest pain shortness of breath. She does feel weak while trying to walk which this is not new for her. She uses the walker to ambulate. No other new complaints. She does report her feet is wobbly when she walks. This is also not new.  05/12/2019: Patient seen and examined. She has no new complaints. Feeling well. Still with reproducible right-sided chest pain where she is palpating herself reporting soreness. No shortness of breath.  05/13/19:Patient apparently slipped coming from the bathroom without assistance andslippedon her right hip.She fell b/c her legs are "wobbly".She does complain of right hip soreness. Otherwise no other complaints. She had declined going to rehab earlier but after discussion now she is willing to reconsider.  05/14/19:Patient has no new complaints. She changed her mind once again and wants to go home and after discussion she is willing to go to rehab. As she has a high risk of falling and returning back to the hospital. She denies any shortness of breath or chest pain. Just soreness from where she slipped on her hip.  05/15/19-patient without any new complaints.  Denies chest pain, shortness of breath, abdominal pain.  Still willing to go to SNF.  Consultants:  None  Procedures: Echo 05/10/2019 1. Left ventricular ejection fraction, by visual estimation, is 55%. The left ventricle has normal function. There is no left ventricular hypertrophy. Probably normal wall motion but the  inferolateral wall was not well-visualized. 2. Left ventricular diastolic parameters are consistent with Grade I diastolic dysfunction (impaired relaxation). 3. Global right ventricle has normal systolic function.The right ventricular size is normal. No increase in right ventricular wall thickness. 4. Left atrial size was mild-moderately dilated. 5. Right atrial size was normal. 6. The mitral valve is normal in structure. No evidence of mitral valve regurgitation. No evidence of mitral stenosis. 7. The tricuspid valve is normal in structure. Tricuspid valve regurgitation is not demonstrated. 8. The aortic valve is tricuspid. Aortic valve regurgitation is not visualized. No evidence of aortic valve sclerosis or stenosis. 9. The inferior vena cava is normal in size with greater than 50% respiratory variability, suggesting right atrial pressure of 3 mmHg. 10. TR signal is inadequate for assessing pulmonary artery systolic pressure.   Antimicrobials:  None  Subjective: Has no complaints.  Denies any pain, chest pain, shortness of breath or any other  Objective: Vitals:   05/15/19 2120 05/16/19 0538 05/16/19 0544 05/16/19 0913  BP: 128/65 123/75  118/68  Pulse: 71 63  69  Resp: 18 18  20   Temp: (!) 97.3 F (36.3 C) 97.8 F (36.6 C)  97.7 F (36.5 C)  TempSrc: Oral Oral  Oral  SpO2: 99% 99%  97%  Weight:   105.9 kg   Height:        Intake/Output Summary (Last 24 hours) at 05/16/2019 1143 Last data filed at 05/16/2019 0800 Gross per 24 hour  Intake 575 ml  Output 950 ml  Net -375 ml   Filed Weights   05/14/19 0427 05/15/19 0334 05/16/19 0544  Weight: 107.7 kg 107.1 kg 105.9 kg    Examination:  General exam: Appears calm  and comfortable, lying in bed, NAD Respiratory system: Clear to auscultation. Respiratory effort normal.  No wheeze rales Cardiovascular system: S1 & S2 heard, RRR. No JVD, murmurs, rubs, gallops Gastrointestinal system: Abdomen is  nondistended, soft and nontender. No organomegaly or masses felt. Normal bowel sounds heard. Central nervous system: Alert and oriented x3. No focal neurological deficits. Extremities: No edema or cyanosis Skin: Warm and dry Psychiatry: Judgement and insight appear normal. Mood & affect appropriate.     Data Reviewed: I have personally reviewed following labs and imaging studies  CBC: Recent Labs  Lab 05/10/19 0411  WBC 6.1  HGB 11.3*  HCT 35.5*  MCV 88.3  PLT 354   Basic Metabolic Panel: Recent Labs  Lab 05/10/19 0411 05/11/19 0325 05/13/19 0429  NA 141 139 137  K 4.0 4.1 3.9  CL 103 103 101  CO2 26 26 28   GLUCOSE 108* 99 95  BUN 16 19 20   CREATININE 1.54* 1.54* 1.55*  CALCIUM 8.8* 8.5* 8.8*   GFR: Estimated Creatinine Clearance: 41.8 mL/min (A) (by C-G formula based on SCr of 1.55 mg/dL (H)). Liver Function Tests: No results for input(s): AST, ALT, ALKPHOS, BILITOT, PROT, ALBUMIN in the last 168 hours. No results for input(s): LIPASE, AMYLASE in the last 168 hours. No results for input(s): AMMONIA in the last 168 hours. Coagulation Profile: No results for input(s): INR, PROTIME in the last 168 hours. Cardiac Enzymes: No results for input(s): CKTOTAL, CKMB, CKMBINDEX, TROPONINI in the last 168 hours. BNP (last 3 results) No results for input(s): PROBNP in the last 8760 hours. HbA1C: No results for input(s): HGBA1C in the last 72 hours. CBG: No results for input(s): GLUCAP in the last 168 hours. Lipid Profile: No results for input(s): CHOL, HDL, LDLCALC, TRIG, CHOLHDL, LDLDIRECT in the last 72 hours. Thyroid Function Tests: No results for input(s): TSH, T4TOTAL, FREET4, T3FREE, THYROIDAB in the last 72 hours. Anemia Panel: No results for input(s): VITAMINB12, FOLATE, FERRITIN, TIBC, IRON, RETICCTPCT in the last 72 hours. Sepsis Labs: No results for input(s): PROCALCITON, LATICACIDVEN in the last 168 hours.  Recent Results (from the past 240 hour(s))  SARS  CORONAVIRUS 2 (TAT 6-24 HRS) Nasopharyngeal Nasopharyngeal Swab     Status: None   Collection Time: 05/09/19  8:40 AM   Specimen: Nasopharyngeal Swab  Result Value Ref Range Status   SARS Coronavirus 2 NEGATIVE NEGATIVE Final    Comment: (NOTE) SARS-CoV-2 target nucleic acids are NOT DETECTED. The SARS-CoV-2 RNA is generally detectable in upper and lower respiratory specimens during the acute phase of infection. Negative results do not preclude SARS-CoV-2 infection, do not rule out co-infections with other pathogens, and should not be used as the sole basis for treatment or other patient management decisions. Negative results must be combined with clinical observations, patient history, and epidemiological information. The expected result is Negative. Fact Sheet for Patients: HairSlick.no Fact Sheet for Healthcare Providers: quierodirigir.com This test is not yet approved or cleared by the [place] FDA and  has been authorized for detection and/or diagnosis of SARS-CoV-2 by FDA under an Emergency Use Authorization (EUA). This EUA will remain  in effect (meaning this test can be used) for the duration of the COVID-19 declaration under Section 56 4(b)(1) of the Act, 21 U.S.C. section 360bbb-3(b)(1), unless the authorization is terminated or revoked sooner. Performed at [facility], [address], [place], [place] [address]   MRSA PCR Screening     Status: Abnormal   Collection Time: 05/10/19  5:53 AM   Specimen: Nasopharyngeal  Result Value Ref Range Status   MRSA by PCR POSITIVE (A) NEGATIVE Final    Comment:        The GeneXpert MRSA Assay (FDA approved for NASAL specimens only), is one component of a comprehensive MRSA colonization surveillance program. It is not intended to diagnose MRSA infection nor to guide or monitor treatment for MRSA infections. RESULT CALLED TO, READ BACK BY AND VERIFIED WITH:  K.BEACER RN AT 0740 05/10/2019 BY A.DAVIS Performed at [facility], [address] [place], [place] [address]   SARS CORONAVIRUS 2 (TAT 6-24 HRS) Nasopharyngeal Nasopharyngeal Swab     Status: None   Collection Time: 05/14/19 11:05 AM   Specimen: Nasopharyngeal Swab  Result Value Ref Range Status   SARS Coronavirus 2 NEGATIVE NEGATIVE Final    Comment: (NOTE) SARS-CoV-2 target nucleic acids are NOT DETECTED. The SARS-CoV-2 RNA is generally detectable in upper and lower respiratory specimens during the acute phase of infection. Negative results do not preclude SARS-CoV-2 infection, do not rule out co-infections with other pathogens, and should not be used as the sole basis for treatment or other patient management decisions. Negative results must be combined with clinical observations, patient history, and epidemiological information. The expected result is Negative. Fact Sheet for Patients: HairSlick.no Fact Sheet for Healthcare Providers: quierodirigir.com This test is not yet approved or cleared by the [place] FDA and  has been authorized for detection and/or diagnosis of SARS-CoV-2 by FDA under an Emergency Use Authorization (EUA). This EUA will remain  in effect (meaning this test can be used) for the duration of the COVID-19 declaration under Section 56 4(b)(1) of the Act, 21 U.S.C. section 360bbb-3(b)(1), unless the authorization is terminated or revoked sooner. Performed at [facility], [address] [place], [place] [address]          Radiology Studies: No results found.      Scheduled Meds: . amitriptyline  150 mg Oral QHS  . aspirin EC  81 mg Oral Daily  . Chlorhexidine Gluconate Cloth  6 each Topical Q0600  . citalopram  20 mg Oral Daily  . diclofenac sodium  2 g Topical QID  . enoxaparin (LOVENOX) injection  40 mg Subcutaneous Q24H  . famotidine  20 mg Oral QHS  . furosemide   40 mg Oral Daily  . gabapentin  1,200 mg Oral 2 times per day  . gabapentin  800 mg Oral QHS  . loratadine  10 mg Oral Daily  . metoprolol tartrate  50 mg Oral BID   Continuous Infusions:  Assessment & Plan:   Principal Problem:   Acute kidney injury [facility]) Active Problems:   Abnormality of gait   Right-sided chest pain   Chronic congestive heart failure (HCC)   Pulmonary hypertension (HCC)   Frequent falls   Near syncope   Fall at home, initial encounter   History of 2019 novel coronavirus disease (COVID-19)   Anemia of chronic disease   1.Acute on chronic kidney disease- Due to prerenal as patient was on Lasix and had decreased p.o. intake. Baseline creatinine previously around 1.3. Urinestudieswithin nml limits. Avoidnephrotoxins New baseline creatinine likely is 1.5  Was hydrated gently initially on admission  Lasix has beenresumed Should have a BMP checked in couple of days    2.Fall secondarywith ?Nearsyncope:  Patienthad previously denied syncopal symptoms. She reported fall due to unsteady legs.  CT angiogram of the chestwas previously obtained whichdid not note  any signs of a pulmonary embolus, but did show signs of right heart dysfunction. Negative for orthostasis Echoas noted above  Telemetry without any events  s/p fall (slipping to floor) on 05/13/2019.> obtained hip xr..> no fx. PT recommends SNF.  Has changed her mind about going to SNF couple of times but now has been firmly agreeable to go. Covid screening reordered and its negative Authorization pending  2.Right-sided chestpain: Doubt cardiac.High-sensitivity troponins negative x2.  EKG withoutischemic changes Chest pain is reproducible ,likely  musculoskeletal in nature from her initial fall . -Voltaren gel to chestas needed for pain   3.Pulmonary artery hypertension, diastolic congestive heart failure: Euvolemic and compensated We will need sleep study as  outpatient.  Outpatient monitoring Continue with lasix    4.History of COVID-19:  Diagnosed previously in 09/2018.  Repeat testingduring this admissionhas been negative.  5.Essential hypertension.  Stable and controlled -Continue metoprolol and furosemide  6.Anemia of chronic disease:H/H at baseline -Continue to monitor  7.Anxiety and depression -Continue Elavil and Celexa    DVT prophylaxis:On Lovenox Code Status:Full code Family Communication:None at bedside Disposition Plan:SNF placement, authorization still  pending.  Covid repeat test is negative.      LOS: 6 days   Time spent: 35 minutes with more than 50% COC    Lynn Ito, MD [facility] Pager [phone]  If 7PM-7AM, please contact night-coverage [email or web address] Password [facility] 05/16/2019, 11:43 AM

## 2019-05-16 NOTE — TOC Progression Note (Signed)
Transition of Care [facility]) - Progression Note    Patient Details  Name: Melinda [name] MRN: [id number] Date of Birth: 04-20-1951  Transition of Care [facility]) CM/SW [place], [place] Phone Number: [phone] 05/16/2019, 9:00 AM  Clinical Narrative:     Insurance authorization at [place] continues to pend at this time.   Expected Discharge Plan: Skilled Nursing Facility Barriers to Discharge: Insurance Authorization  Expected Discharge Plan and Services Expected Discharge Plan: [place] Choice: [place] arrangements for the past 2 months: Single Family Home                                       Social Determinants of Health (SDOH) Interventions    Readmission Risk Interventions No flowsheet data found.

## 2019-05-17 ENCOUNTER — Encounter: Payer: Self-pay | Admitting: Internal Medicine

## 2019-05-17 ENCOUNTER — Non-Acute Institutional Stay (SKILLED_NURSING_FACILITY): Payer: Medicare HMO | Admitting: Internal Medicine

## 2019-05-17 DIAGNOSIS — I10 Essential (primary) hypertension: Secondary | ICD-10-CM | POA: Diagnosis not present

## 2019-05-17 DIAGNOSIS — Z79899 Other long term (current) drug therapy: Secondary | ICD-10-CM

## 2019-05-17 DIAGNOSIS — R55 Syncope and collapse: Secondary | ICD-10-CM | POA: Diagnosis not present

## 2019-05-17 DIAGNOSIS — F419 Anxiety disorder, unspecified: Secondary | ICD-10-CM | POA: Diagnosis not present

## 2019-05-17 DIAGNOSIS — N289 Disorder of kidney and ureter, unspecified: Secondary | ICD-10-CM

## 2019-05-17 DIAGNOSIS — Z1159 Encounter for screening for other viral diseases: Secondary | ICD-10-CM | POA: Diagnosis not present

## 2019-05-17 DIAGNOSIS — R079 Chest pain, unspecified: Secondary | ICD-10-CM

## 2019-05-17 DIAGNOSIS — R52 Pain, unspecified: Secondary | ICD-10-CM | POA: Diagnosis not present

## 2019-05-17 DIAGNOSIS — G63 Polyneuropathy in diseases classified elsewhere: Secondary | ICD-10-CM

## 2019-05-17 DIAGNOSIS — R296 Repeated falls: Secondary | ICD-10-CM

## 2019-05-17 DIAGNOSIS — N189 Chronic kidney disease, unspecified: Secondary | ICD-10-CM | POA: Diagnosis not present

## 2019-05-17 DIAGNOSIS — G4700 Insomnia, unspecified: Secondary | ICD-10-CM | POA: Diagnosis not present

## 2019-05-17 DIAGNOSIS — N179 Acute kidney failure, unspecified: Secondary | ICD-10-CM | POA: Diagnosis not present

## 2019-05-17 DIAGNOSIS — M2550 Pain in unspecified joint: Secondary | ICD-10-CM | POA: Diagnosis not present

## 2019-05-17 DIAGNOSIS — E559 Vitamin D deficiency, unspecified: Secondary | ICD-10-CM | POA: Diagnosis not present

## 2019-05-17 DIAGNOSIS — I959 Hypotension, unspecified: Secondary | ICD-10-CM | POA: Diagnosis not present

## 2019-05-17 DIAGNOSIS — M79609 Pain in unspecified limb: Secondary | ICD-10-CM | POA: Diagnosis not present

## 2019-05-17 DIAGNOSIS — G43909 Migraine, unspecified, not intractable, without status migrainosus: Secondary | ICD-10-CM | POA: Diagnosis not present

## 2019-05-17 DIAGNOSIS — R202 Paresthesia of skin: Secondary | ICD-10-CM | POA: Diagnosis not present

## 2019-05-17 DIAGNOSIS — J309 Allergic rhinitis, unspecified: Secondary | ICD-10-CM | POA: Diagnosis not present

## 2019-05-17 DIAGNOSIS — R5381 Other malaise: Secondary | ICD-10-CM | POA: Diagnosis not present

## 2019-05-17 DIAGNOSIS — Z7401 Bed confinement status: Secondary | ICD-10-CM | POA: Diagnosis not present

## 2019-05-17 DIAGNOSIS — F339 Major depressive disorder, recurrent, unspecified: Secondary | ICD-10-CM | POA: Diagnosis not present

## 2019-05-17 MED ORDER — DICLOFENAC SODIUM 1 % TD GEL: 2.0000 g | Freq: Four times a day (QID) | TRANSDERMAL | 0 refills | Status: DC

## 2019-05-17 MED ORDER — SUMATRIPTAN SUCCINATE 25 MG PO TABS: 25.0000 mg | ORAL_TABLET | ORAL | 0 refills | Status: DC | PRN

## 2019-05-17 MED ORDER — GABAPENTIN 400 MG PO CAPS: 1200.0000 mg | ORAL_CAPSULE | Freq: Two times a day (BID) | ORAL | 0 refills | Status: DC

## 2019-05-17 MED ORDER — POLYVINYL ALCOHOL 1.4 % OP SOLN: 2.0000 [drp] | OPHTHALMIC | 0 refills | Status: DC | PRN

## 2019-05-17 NOTE — Assessment & Plan Note (Addendum)
Creatinine clearance will be slightly higher than GFR  but Up to Date suggest with the creatinine rise to 1.54 the creatinine clearance would most likely be less than 50.  Maximum dose of the gabapentin would be 900 mg/day in 2-3 divided doses.  This was discussed with the patient.  It would be recommended that the present dose of gabapentin of 3200 mg a day be decreased to 300 mg 3 times daily.

## 2019-05-17 NOTE — TOC Progression Note (Signed)
Transition of Care [facility]) - Progression Note    Patient Details  Name: Melinda [name] MRN: [id number] Date of Birth: [birth year]  Transition of Care [facility]) CM/SW [place],  Phone Number: [phone] 05/17/2019, 9:07 AM  Clinical Narrative:     CSW received notification from [place] that patient's [facility] authorization has been received. Plan for discharge to [facility] today, CSW has requested dc summary and orders from MD.   Expected Discharge Plan: [place] Barriers to Discharge: Insurance Authorization  Expected Discharge Plan and Services Expected Discharge Plan: [place] Choice: [place] arrangements for the past 2 months: Single Family Home                                       Social Determinants of Health (SDOH) Interventions    Readmission Risk Interventions No flowsheet data found.

## 2019-05-17 NOTE — Assessment & Plan Note (Addendum)
Perhaps the combination high-dose amitriptyline and SSRI could be weaned and changed to generic Cymbalta.

## 2019-05-17 NOTE — Progress Notes (Signed)
Physical Therapy Treatment Patient Details Name: Melinda [name] MRN: [id number] DOB: 02-23-1951 Today's Date: 05/17/2019    History of Present Illness Patient is a 68 y/o female who presents with near syncope and fall and reports chest pain. PMH includes asthma, pulmonary HTN, Chronic diastolic CHF, fibromyalgia.    PT Comments    Patient progressing well towards PT goals. Tolerated gait training with Mod A for balance/safety. Pt with poor sensation and proprioception BLEs putting pt at increased risk for falls. Also noted to have cognitive deficits relating to attention, safety, awareness and problem solving. Pt with poor ability to self monitor symptoms and weakness. Worked on standing balance to prevent LOB posteriorly. Continue to recommend SNF. Pt not agreeable. Will follow.    Follow Up Recommendations  SNF;Supervision for mobility/OOB;Supervision/Assistance - 24 hour     Equipment Recommendations  3in1 (PT)    Recommendations for Other Services       Precautions / Restrictions Precautions Precautions: Fall Precaution Comments: frequent falls at home.  Reports 10 falls in last month Restrictions Weight Bearing Restrictions: No    Mobility  Bed Mobility Overal bed mobility: Needs Assistance Bed Mobility: Supine to Sit     Supine to sit: Supervision Sit to supine: Supervision;HOB elevated   General bed mobility comments: up in chair upon PT arrival.  Transfers Overall transfer level: Needs assistance Equipment used: Rolling walker (2 wheeled) Transfers: Sit to/from Stand Sit to Stand: Min assist;Mod assist Stand pivot transfers: Mod assist       General transfer comment: Min A to stand from chair with cues for hand placement, with posterior bias and LOB once upright; mod A once up due to retropulsion. Stood from chair x3. Worked on finding midline using head/shoulders to prevent LOB and slow descent into chair emphasizing eccentric  control.  Ambulation/Gait Ambulation/Gait assistance: Mod assist Gait Distance (Feet): 40 Feet Assistive device: Rolling walker (2 wheeled) Gait Pattern/deviations: Trunk flexed;Step-to pattern;Step-through pattern;Narrow base of support;Decreased stride length;Staggering right Gait velocity: decreased   General Gait Details: Slow, unsteady gait with mod A for balance due to bil knee instability, narrow BoS, difficulty with proper foot placement (poor proprioception/sensation BLEs).   Stairs             Wheelchair Mobility    Modified Rankin (Stroke Patients Only)       Balance Overall balance assessment: History of Falls;Needs assistance Sitting-balance support: Feet supported;No upper extremity supported Sitting balance-Leahy Scale: Fair Sitting balance - Comments: Fatigues sitting without back support in chair   Standing balance support: During functional activity Standing balance-Leahy Scale: Poor Standing balance comment: Reliant on BUE support and external support due to posterior biasa nd LOB on numerous occasions. Worked on finding midline using head/shoulders in standing to prevent LOB backwards. Swaying forwards/backwards. poor sensation BLEs.                            Cognition Arousal/Alertness: Awake/alert Behavior During Therapy: WFL for tasks assessed/performed Overall Cognitive Status: Impaired/Different from baseline Area of Impairment: Memory;Safety/judgement;Following commands;Awareness;Problem solving;Attention                   Current Attention Level: Selective Memory: Decreased short-term memory Following Commands: Follows multi-step commands with increased time Safety/Judgement: Decreased awareness of safety;Decreased awareness of deficits Awareness: Emergent Problem Solving: Slow processing;Requires verbal cues General Comments: Tangential in speech; requires repetition and cues to stay attended to task. easily distracted.  Poor safety awareness. After discussing  how a rollator is not safe, pt asking "what about getting me the tall rollator to help with getting me up?"      Exercises      General Comments General comments (skin integrity, edema, etc.): VSS      Pertinent Vitals/Pain Pain Assessment: No/denies pain    Home Living                      Prior Function            PT Goals (current goals can now be found in the care plan section) Acute Rehab PT Goals Patient Stated Goal: to do better Progress towards PT goals: Progressing toward goals    Frequency    Min 2X/week      PT Plan Current plan remains appropriate    Co-evaluation              AM-PAC PT "6 Clicks" Mobility   Outcome Measure  Help needed turning from your back to your side while in a flat bed without using bedrails?: A Little Help needed moving from lying on your back to sitting on the side of a flat bed without using bedrails?: A Little Help needed moving to and from a bed to a chair (including a wheelchair)?: A Lot Help needed standing up from a chair using your arms (e.g., wheelchair or bedside chair)?: A Little Help needed to walk in hospital room?: A Lot Help needed climbing 3-5 steps with a railing? : Total 6 Click Score: 14    End of Session Equipment Utilized During Treatment: Gait belt Activity Tolerance: Patient tolerated treatment well Patient left: in chair;with call bell/phone within reach;with chair alarm set Nurse Communication: Mobility status PT Visit Diagnosis: Muscle weakness (generalized) (M62.81);Difficulty in walking, not elsewhere classified (R26.2)     Time: 5462-7035 PT Time Calculation (min) (ACUTE ONLY): 21 min  Charges:  $Therapeutic Activity: 8-22 mins                     Vale Haven, PT, DPT Acute Rehabilitation Services Pager [phone] Office [phone]       Blake Divine A [facility]ier [facility] 05/17/2019, 12:01 PM

## 2019-05-17 NOTE — Progress Notes (Signed)
Occupational Therapy Treatment Patient Details Name: Melinda [name] MRN: [id number] DOB: [birth year] Today's Date: 05/17/2019    History of present illness Patient is a 68 y/o female who presents with near syncope and fall and reports chest pain. PMH includes asthma, pulmonary HTN, Chronic diastolic CHF, fibromyalgia.   OT comments  Pt motivated for OT intervention this session and agreeable to therapy. Pt seated in recliner chair upon entering the room and requesting to use bathroom. Sit >stand from recliner chair with mod A and initially ambulating with RW and min A into bathroom with min cuing for RW advancement. Pt standing at sink for 5 minutes while washing face, hands, and brushing teeth with posterior bias and needing min - mod A for standing balance with multiple LOBs. Pt fatiguing at requiring abrupt assist into chair for safety. Pt with limited safety awareness this session. Pt in chair with call bell and all needed items within reach and chair alarm activated.  OT continues to recommend SNF secondary to level of assist. Pt continues to benefit from OT intervention.    Follow Up Recommendations  SNF    Equipment Recommendations  Other (comment)(defer to next venue of care)       Precaution/ Restrictions Precautions Precautions: Fall Precaution Comments: frequent falls at home.  Reports 10 falls in last month Restrictions Weight Bearing Restrictions: No       Mobility Bed Mobility Overal bed mobility: Needs Assistance Bed Mobility: Supine to Sit     Supine to sit: Supervision Sit to supine: Supervision;HOB elevated   General bed mobility comments: pt reaching for UE support for trunk, cueing to utilize bed rail to complete task with supervision  Transfers Overall transfer level: Needs assistance Equipment used: Rolling walker (2 wheeled) Transfers: Sit to/from [employer] Sit to Stand: Mod assist Stand pivot transfers: Mod assist       General  transfer comment: mod lifting assistance    Balance Overall balance assessment: History of Falls;Needs assistance Sitting-balance support: Feet supported;No upper extremity supported Sitting balance-Leahy Scale: Fair     Standing balance support: Single extremity supported;Bilateral upper extremity supported;During functional activity Standing balance-Leahy Scale: Poor Standing balance comment: relaint on BUE support dynamically, min - mod A LOB at sink in standing       ADL either performed or assessed with clinical judgement   ADL Overall ADL's : Needs assistance/impaired     Grooming: Oral care;Wash/dry hands;Standing;Moderate assistance Grooming Details (indicate cue type and reason): multiple      Toilet Transfer: Moderate assistance;Regular Toilet;Ambulation;RW Toilet Transfer Details (indicate cue type and reason): mod lifting assistance Toileting- Clothing Manipulation and Hygiene: Moderate assistance Toileting - Clothing Manipulation Details (indicate cue type and reason): assistance for balance with clothing management and hygiene       General ADL Comments: Pt needing increased assistance with more LOB as time went on. Fatigues very quickly this session and does not know limitations               Cognition Arousal/Alertness: Awake/alert Behavior During Therapy: WFL for tasks assessed/performed Overall Cognitive Status: Impaired/Different from baseline Area of Impairment: Memory;Safety/judgement;Following commands;Awareness;Problem solving;Attention        Current Attention Level: Selective Memory: Decreased short-term memory Following Commands: Follows multi-step commands with increased time Safety/Judgement: Decreased awareness of safety;Decreased awareness of deficits Awareness: Emergent Problem Solving: Slow processing;Requires verbal cues                     Pertinent Vitals/ Pain  Pain Assessment: No/denies pain         Frequency   Min 2X/week        Progress Toward Goals  OT Goals(current goals can now be found in the care plan section)  Progress towards OT goals: Progressing toward goals  Acute Rehab OT Goals Patient Stated Goal: to do better OT Goal Formulation: With patient Time For Goal Achievement: 05/31/19 Potential to Achieve Goals: Good  Plan Discharge plan remains appropriate;Frequency remains appropriate       AM-PAC OT "6 Clicks" Daily Activity     Outcome Measure   Help from another person eating meals?: None Help from another person taking care of personal grooming?: A Little Help from another person toileting, which includes using toliet, bedpan, or urinal?: A Lot Help from another person bathing (including washing, rinsing, drying)?: A Lot Help from another person to put on and taking off regular upper body clothing?: A Little Help from another person to put on and taking off regular lower body clothing?: A Lot 6 Click Score: 16    End of Session Equipment Utilized During Treatment: Rolling walker  OT Visit Diagnosis: Unsteadiness on feet (R26.81);Muscle weakness (generalized) (M62.81)   Activity Tolerance Patient tolerated treatment well   Patient Left in chair;with call bell/phone within reach;with chair alarm set   Nurse Communication          Time: [phone] OT Time Calculation (min): 20 min  Charges: OT General Charges $OT Visit: 1 Visit OT Treatments $Self Care/Home Management : 8-22 mins   Gypsy Decant MS, OTR/L 05/17/2019, 11:33 AM

## 2019-05-17 NOTE — TOC Transition Note (Signed)
Transition of Care [facility]) - CM/SW Discharge Note   Patient Details  Name: Melinda [name] MRN: [id number] Date of Birth: 05-Jul-1950  [facility] of [facility]) CM/SW Contact:  Alberteen Sam, LCSW Phone Number: 05/17/2019, 12:01 PM   Clinical Narrative:     Patient will DC to: [facility] Anticipated DC date: 05/17/2019 Family notified: patient will inform Transport VE:[facility]  Per MD patient ready for DC to [facility] . RN, patient, patient's family, and facility notified of DC. Discharge Summary sent to facility. RN given number for report [phone] Room 319 . DC packet on chart. Ambulance transport requested for patient.  CSW signing off.  [place], [place]   Final next level of care: Skilled Nursing Facility Barriers to Discharge: No Barriers Identified   Patient Goals and CMS Choice Patient states their goals for this hospitalization and ongoing recovery are:: to go to rehab then home CMS Medicare.gov Compare Post Acute Care list provided to:: Patient Choice offered to / list presented to : Patient  Discharge Placement PASRR number recieved: 05/11/19            Patient chooses bed at: [facility] Patient to be transferred to facility by: [place] Name of family member notified: patient will inform Patient and family notified of of transfer: 05/17/19  Discharge Plan and Services     Post Acute Care Choice: [facility] [place]                               Social Determinants of Health (SDOH) Interventions     Readmission Risk Interventions No flowsheet data found.

## 2019-05-17 NOTE — Progress Notes (Signed)
NURSING HOME LOCATION:  [facility] ROOM NUMBER:  [id number]  CODE STATUS: Full code  PCP: [name] [name] NP  This is a comprehensive admission note to [facility] performed on this date less than 30 days from date of admission. Included are preadmission medical/surgical history; reconciled medication list; family history; social history and comprehensive review of systems.  Corrections and additions to the records were documented. Comprehensive physical exam was also performed. Additionally a clinical summary was entered for each active diagnosis pertinent to this admission in the Problem List to enhance continuity of care.  HPI: Patient was hospitalized 11/9-11/17/2020 admitted from home having fallen. Post fall she was complaining of right-sided chest pain.CT angiogram of the chest did not reveal PTE; troponins were negative and EKG revealed no ischemic changes.   Near syncope was questioned and she was noted to be orthostatic and her furosemide was held initially.  She was gently hydrated and the furosemide resumed.  TTE revealed normal ejection fraction with grade 1 diastolic dysfunction. The chest pain appeared to be reproducible suggesting chest wall costrocondral etiology.  Voltaren gel as needed was prescribed. Acute on CKD was documented and furosemide dose decreased.  New baseline creatinine was felt to be 1.5, up from 1.3. She exhibited weakness in the lower extremities with unstable gait which was associated with another fall while hospitalized.  PT/OT recommended rehab.  Past medical and surgical history: Includes pulmonary hypertension, history of novel corona viral disease in March 2019, anemia of chronic disease, vitamin D deficiency ,Raynaud's syndrome, osteoporosis, migraines, lumbar stenosis with sciatica, GERD, fibromyalgia, essential hypertension, depression, chronic back pain, asthma, and anxiety. Surgeries include rotator cuff repair and abdominal hysterectomy.   Social history: Nondrinker; never smoked.  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  Family history: Extensive history reviewed.  She has a son and a granddaughter with bipolar disorder.  She denies having schizophrenia or bipolar disorder.   Review of systems: She denies any cardiac or neurologic prodrome prior to the falls.  She states that the falls are related to her being unbalanced & unsteady.  She has chronic numbness and tingling related to her peripheral neuropathy and lumbar stenosis.  She has chronic constipation in the context of the opioids.  She states that she receives 120 pills a month and typically will have 2-3 left at the end of the month. She admits to ongoing depression.   She states she has been told she snores.  She has never been evaluated for sleep apnea. She has cold intolerance and states that her hands stay cold.  She relates this to her Raynaud's phenomena.    Constitutional: No fever, significant weight change Eyes: No redness, discharge, pain, vision change ENT/mouth: No nasal congestion, purulent discharge, earache, change in hearing, sore throat  Cardiovascular: No new chest pain, palpitations, paroxysmal nocturnal dyspnea, claudication Respiratory: No cough, sputum production, hemoptysis, DOE, apnea  Gastrointestinal: No heartburn, dysphagia, abdominal pain, nausea /vomiting, rectal bleeding, melena Genitourinary: No dysuria, hematuria, pyuria, incontinence, nocturia Musculoskeletal: No joint stiffness, joint swelling Dermatologic: No rash, pruritus, change in appearance of skin Neurologic: No dizziness, headache, frank syncope, seizures Psychiatric: No insomnia, anorexia Endocrine: No change in hair/skin/nails, excessive thirst, excessive hunger, excessive urination  Hematologic/lymphatic: No significant bruising, lymphadenopathy, abnormal bleeding Allergy/immunology: No itchy/watery eyes,  significant sneezing, urticaria, angioedema  Physical exam:  Pertinent or positive findings: She is alert and oriented.  She is obese.  She is  missing multiple teeth.  Heart sounds are distant.  Breath sounds are decreased.  Pedal pulses are decreased.  There is trace edema at the sock line.  She is weak to opposition in all extremities.  General appearance:  no acute distress, increased work of breathing is present.   Lymphatic: No lymphadenopathy about the head, neck, axilla. Eyes: No conjunctival inflammation or lid edema is present. There is no scleral icterus. Ears:  External ear exam shows no significant lesions or deformities.   Nose:  External nasal examination shows no deformity or inflammation. Nasal mucosa are pink and moist without lesions, exudates Oral exam: Lips and gums are healthy appearing.There is no oropharyngeal erythema or exudate. Neck:  No thyromegaly, masses, tenderness noted.  Heart:  No gallop, murmur, click, rub.  Lungs: without wheezes, rhonchi, rales, rubs. Abdomen: Bowel sounds are normal.  Abdomen is soft and nontender with no organomegaly, hernias, masses. GU: Deferred  Extremities:  No cyanosis, clubbing. Neurologic exam: Balance, Rhomberg, finger to nose testing could not be completed due to clinical state Skin: Warm & dry w/o tenting. No significant lesions or rash.  See clinical summary under each active problem in the Problem List with associated updated therapeutic plan

## 2019-05-17 NOTE — Assessment & Plan Note (Signed)
Reproducibility suggests chest wall pain; topical Voltaren prn.

## 2019-05-17 NOTE — Assessment & Plan Note (Addendum)
Deprescribing as possible should be considered of any [place] medications.

## 2019-05-17 NOTE — Patient Instructions (Signed)
See assessment and plan under each diagnosis in the problem list and acutely for this visit 

## 2019-05-17 NOTE — Assessment & Plan Note (Addendum)
Blood pressures recorded at the hospital tended to reveal systolic of 590 or lower.  Systolic of 931 was an outlier.

## 2019-05-17 NOTE — Discharge Summary (Signed)
Melinda [name] DTO:[id number] DOB: Nov 22, 1950 DOA: 05/09/2019  PCP: Melinda Hughs, NP  Admit date: 05/09/2019 Discharge date: 05/17/2019  Admitted From: Home Disposition:  [facility]  Recommendations for Outpatient Follow-up:  1. Follow up with PCP in 1 week 2. Please obtain BMP/CBC in one week 3. Please follow up on the following pending results:  Home Health:no    Discharge Condition:Stable CODE STATUS:full  Diet recommendation: Heart Healthy , low potassium diet Brief/Interim Summary: Melinda Mcilwainis a 68 y.o.femalewith medical history significant ofhypertension, CHF, pulmonary hypertension, frequent falls, fibromyalgia,COVID-19 infection in 09/2018,and chronic back pain. She presents after having a fall at home with complaints of chest pain.She had multiple covid test while here which were negative.  There was thought she may have fall secondary to near syncope.  She was orthostatics her Lasix were held.  CT angio of the chest was obtained that did not reveal any pulmonary embolism but did show signs of right heart function.  No events were recorded on telemetry.  Troponins for her chest pain were negative and EKG was without any significant ischemic changes.  No fractures of the ribs were noted on imaging.  She was gently hydrated and her Lasix was resumed.  Her legs have been remaining weak and wobbly and she fell again without assistance.  PT OT was consulted and they recommended rehab.  After multiple times patient changing her mind she is now willing to go to rehab.  An echocardiogram was also done that revealed normal EF with grade 1 diastolic dysfunction.  Her RV systolic function and size were normal on echocardiogram.  We did recommend for patient to have sleep study as outpatient.  Found acute on chronic kidney disease and her Lasix dose was decreased.  She is currently at her new baseline creatinine of 1.5.  Her acute renal insufficiency did improve with hydration.   She has been encouraged to increase her p.o. intake and hydration and she has been doing that in the hospital.  Her chest pain was reproducible and it was thought it was due to musculoskeletal and she was placed on Voltaren gel as needed.  She is stable to be transferred to rehab today. Discharge Diagnoses:  Principal Problem:   Acute kidney injury [place]) Active Problems:   Abnormality of gait   Right-sided chest pain   Chronic congestive heart failure ([place])   Pulmonary hypertension ([facility])   Frequent falls   Near syncope   Fall at home, initial encounter   History of 2019 novel coronavirus disease (COVID-19)   Anemia of chronic disease    Discharge Instructions  Discharge Instructions    Call MD for:  temperature >100.4   Complete by: As directed    Diet - low sodium heart healthy   Complete by: As directed    Discharge instructions   Complete by: As directed    F/u with pcp in 1-3 days. Needs bmp in 3 days. Needs sleep study as outpatient   Increase activity slowly   Complete by: As directed      Allergies as of 05/17/2019   No Known Allergies     Medication List    STOP taking these medications   antiseptic oral rinse Liqd   carboxymethylcellulose 0.5 % Soln Commonly known as: REFRESH PLUS   guaiFENesin 600 MG 12 hr tablet Commonly known as: MUCINEX     TAKE these medications   acetaminophen 500 MG tablet Commonly known as: TYLENOL Take 500 mg by mouth every 6 (six)  hours as needed for moderate pain or fever.   albuterol 108 (90 Base) MCG/ACT inhaler Commonly known as: Ventolin HFA INHALE 2 PUFFS  EVERY 6 (SIX) HOURS AS NEEDED FOR WHEEZING OR SHORTNESS OF BREATH.   amitriptyline 150 MG tablet Commonly known as: ELAVIL Take 1 tablet (150 mg total) by mouth at bedtime. For mood   ascorbic acid 500 MG tablet Commonly known as: VITAMIN C Take 1 tablet (500 mg total) by mouth 2 (two) times daily.   aspirin EC 81 MG tablet Take 81 mg by mouth daily.    citalopram 20 MG tablet Commonly known as: CELEXA TAKE 1 TABLET BY MOUTH EVERY DAY   diclofenac sodium 1 % Gel Commonly known as: VOLTAREN Apply 2 g topically 4 (four) times daily.   famotidine 20 MG tablet Commonly known as: PEPCID Take 1 tablet (20 mg total) by mouth at bedtime.   fluticasone 50 MCG/ACT nasal spray Commonly known as: FLONASE Place 2 sprays into both nostrils daily as needed for allergies or rhinitis.   furosemide 40 MG tablet Commonly known as: LASIX TAKE 1 TABLET BY MOUTH EVERY DAY   gabapentin 400 MG capsule Commonly known as: NEURONTIN TAKE 3 CAPSULES TWICE DAILY  AND TAKE 2 CAPSULES AT BEDTIME  FOR  NEUROPATHY What changed: See the new instructions.   loratadine 10 MG tablet Commonly known as: CLARITIN Take 10 mg by mouth daily.   metoprolol tartrate 50 MG tablet Commonly known as: LOPRESSOR Take one tablet by mouth twice daily for blood pressure What changed:   how much to take  how to take this  when to take this  additional instructions   oxyCODONE 15 MG immediate release tablet Commonly known as: ROXICODONE Take 1 tablet (15 mg total) by mouth every 6 (six) hours as needed for pain.   polyvinyl alcohol 1.4 % ophthalmic solution Commonly known as: LIQUIFILM TEARS Place 2 drops into both eyes as needed for dry eyes.   SUMAtriptan 25 MG tablet Commonly known as: IMITREX TAKE 1 TAB EVERY 2 HRS AS NEEDED FOR MIGRAINE OR HEADACHE. MAY REPEAT IN 2 HRS IF PERSISTS/RECURS. What changed: See the new instructions.   vitamin B-12 500 MCG tablet Commonly known as: CYANOCOBALAMIN Take 500 mcg by mouth daily.   Vitamin D3 50 MCG (2000 UT) Tabs Take 2,000 Units by mouth daily.   zinc sulfate 220 (50 Zn) MG capsule Take 220 mg by mouth daily.            Durable Medical Equipment  (From admission, onward)         Start     Ordered   05/13/19 1247  For home use only DME 3 n 1  Once     05/13/19 1246          Contact  information for follow-up providers    [facility].   Specialty: Home Health Services Why: [facility] will call you to schedule home health service days and times.  Contact information: [address] [place] [address] [phone]            Contact information for after-discharge care    Destination    [facility] .   Service: Skilled Nursing Contact information: [address] [place] [id number] [phone]                 No Known Allergies  Consultations:  None   Procedures/Studies: Dg Chest 2  View  Result Date: 05/09/2019 CLINICAL DATA:  Chest pain EXAM: CHEST - 2 VIEW COMPARISON:  None. FINDINGS: The heart size and mediastinal contours are within normal limits. Both lungs are clear. The visualized skeletal structures are unremarkable. IMPRESSION: No active cardiopulmonary disease. Electronically Signed   By: Deatra Robinson M.D.   On: 05/09/2019 03:41   Ct Angio Chest Pe W And/or Wo Contrast  Result Date: 05/09/2019 CLINICAL DATA:  68 year old female with right-sided chest pain. EXAM: CT ANGIOGRAPHY CHEST WITH CONTRAST TECHNIQUE: Multidetector CT imaging of the chest was performed using the standard protocol during bolus administration of intravenous contrast. Multiplanar CT image reconstructions and MIPs were obtained to evaluate the vascular anatomy. CONTRAST:  53mL OMNIPAQUE IOHEXOL 350 MG/ML SOLN COMPARISON:  Chest radiograph dated 05/09/2019 FINDINGS: Cardiovascular: There is mild cardiomegaly. No pericardial effusion. There is retrograde flow of contrast from the right atrium into the IVC indicative of a degree of right heart dysfunction. Correlation with echocardiogram recommended. Mild atherosclerotic calcification of the aortic arch. There is no CT evidence of pulmonary embolism. Mediastinum/Nodes: No hilar or mediastinal adenopathy. The esophagus is grossly unremarkable. No  mediastinal fluid collection. Lungs/Pleura: Patchy areas of hazy airspace density may represent atelectatic changes or areas of air trapping related to underlying small airways versus small vessel disease. Linear atelectasis/scarring noted in the lingula and left lower lobe. There is no focal consolidation, pleural effusion, or pneumothorax. There is tracheal malacia. The central airways are patent. Upper Abdomen: Small scattered calcified hepatic granuloma. Musculoskeletal: No chest wall abnormality. No acute or significant osseous findings. Review of the MIP images confirms the above findings. IMPRESSION: 1. No acute intrathoracic pathology. No CT evidence of pulmonary embolism. 2. Mild cardiomegaly with evidence of right heart dysfunction. Correlation with echocardiogram recommended. 3. Aortic Atherosclerosis (ICD10-I70.0). Electronically Signed   By: Elgie Collard M.D.   On: 05/09/2019 09:35   Dg Hip Unilat With Pelvis 2-3 Views Right  Result Date: 05/13/2019 CLINICAL DATA:  Inpatient encounter for fall--c/o R hip pain EXAM: DG HIP (WITH OR WITHOUT PELVIS) 2-3V RIGHT COMPARISON:  None. FINDINGS: Hips are located. No evidence of pelvic fracture or sacral fracture. Dedicated view of the RIGHT hip demonstrates no femoral neck fracture. IMPRESSION: No evidence of pelvic fracture or RIGHT hip fracture. Electronically Signed   By: Genevive Bi M.D.   On: 05/13/2019 15:58      Subjective: Patient seen and examined has no complaints.  Denies any chest pain, shortness of breath, abdominal pain or any other symptoms.  Discharge Exam: Vitals:   05/17/19 0619 05/17/19 0848  BP: (!) 115/50 (!) 120/50  Pulse: 72 70  Resp: 20   Temp: 97.6 F (36.4 C) 97.7 F (36.5 C)  SpO2: 97% 97%   Vitals:   05/16/19 1247 05/16/19 2051 05/17/19 0619 05/17/19 0848  BP: (!) 121/95 140/83 (!) 115/50 (!) 120/50  Pulse:  66 72 70  Resp: 18 18 20    Temp: 97.8 F (36.6 C) (!) 97.4 F (36.3 C) 97.6 F (36.4 C)  97.7 F (36.5 C)  TempSrc: Oral Oral Oral Oral  SpO2: 98% 100% 97% 97%  Weight:   107.7 kg   Height:        General: Pt is alert, awake, x3 not in acute distress Cardiovascular: RRR, S1/S2 +, no rubs, no gallops Respiratory: CTA bilaterally, no wheezing, no rhonchi Abdominal: Soft, NT, ND, bowel sounds + Extremities: no edema, no cyanosis    The results of significant diagnostics from this hospitalization (including  imaging, microbiology, ancillary and laboratory) are listed below for reference.     Microbiology: Recent Results (from the past 240 hour(s))  SARS CORONAVIRUS 2 (TAT 6-24 HRS) Nasopharyngeal Nasopharyngeal Swab     Status: None   Collection Time: 05/09/19  8:40 AM   Specimen: Nasopharyngeal Swab  Result Value Ref Range Status   SARS Coronavirus 2 NEGATIVE NEGATIVE Final    Comment: (NOTE) SARS-CoV-2 target nucleic acids are NOT DETECTED. The SARS-CoV-2 RNA is generally detectable in upper and lower respiratory specimens during the acute phase of infection. Negative results do not preclude SARS-CoV-2 infection, do not rule out co-infections with other pathogens, and should not be used as the sole basis for treatment or other patient management decisions. Negative results must be combined with clinical observations, patient history, and epidemiological information. The expected result is Negative. Fact Sheet for Patients: HairSlick.nohttps://www.fda.gov/media/138098/download Fact Sheet for Healthcare Providers: quierodirigir.comhttps://www.fda.gov/media/138095/download This test is not yet approved or cleared by the [place] FDA and  has been authorized for detection and/or diagnosis of SARS-CoV-2 by FDA under an Emergency Use Authorization (EUA). This EUA will remain  in effect (meaning this test can be used) for the duration of the COVID-19 declaration under Section 56 4(b)(1) of the Act, 21 U.S.C. section 360bbb-3(b)(1), unless the authorization is terminated or revoked  sooner. Performed at [facility], [address] [place], [place] [address]   MRSA PCR Screening     Status: Abnormal   Collection Time: 05/10/19  5:53 AM   Specimen: Nasopharyngeal  Result Value Ref Range Status   MRSA by PCR POSITIVE (A) NEGATIVE Final    Comment:        The GeneXpert MRSA Assay (FDA approved for NASAL specimens only), is one component of a comprehensive MRSA colonization surveillance program. It is not intended to diagnose MRSA infection nor to guide or monitor treatment for MRSA infections. RESULT CALLED TO, READ BACK BY AND VERIFIED WITH: K.BEACER RN AT 0740 05/10/2019 BY A.DAVIS Performed at [facility], [address] [place], [place] [address]   SARS CORONAVIRUS 2 (TAT 6-24 HRS) Nasopharyngeal Nasopharyngeal Swab     Status: None   Collection Time: 05/14/19 11:05 AM   Specimen: Nasopharyngeal Swab  Result Value Ref Range Status   SARS Coronavirus 2 NEGATIVE NEGATIVE Final    Comment: (NOTE) SARS-CoV-2 target nucleic acids are NOT DETECTED. The SARS-CoV-2 RNA is generally detectable in upper and lower respiratory specimens during the acute phase of infection. Negative results do not preclude SARS-CoV-2 infection, do not rule out co-infections with other pathogens, and should not be used as the sole basis for treatment or other patient management decisions. Negative results must be combined with clinical observations, patient history, and epidemiological information. The expected result is Negative. Fact Sheet for Patients: HairSlick.nohttps://www.fda.gov/media/138098/download Fact Sheet for Healthcare Providers: quierodirigir.comhttps://www.fda.gov/media/138095/download This test is not yet approved or cleared by the [place] FDA and  has been authorized for detection and/or diagnosis of SARS-CoV-2 by FDA under an Emergency Use Authorization (EUA). This EUA will remain  in effect (meaning this test can be used) for the duration of the COVID-19 declaration  under Section 56 4(b)(1) of the Act, 21 U.S.C. section 360bbb-3(b)(1), unless the authorization is terminated or revoked sooner. Performed at [facility], [address] [place], [place] [address]      Labs: BNP (last 3 results) Recent Labs    10/06/18 0319 05/10/19 0411  BNP 34.6 312.3*   Basic Metabolic  Panel: Recent Labs  Lab 05/11/19 0325 05/13/19 0429  NA 139 137  K 4.1 3.9  CL 103 101  CO2 26 28  GLUCOSE 99 95  BUN 19 20  CREATININE 1.54* 1.55*  CALCIUM 8.5* 8.8*   Liver Function Tests: No results for input(s): AST, ALT, ALKPHOS, BILITOT, PROT, ALBUMIN in the last 168 hours. No results for input(s): LIPASE, AMYLASE in the last 168 hours. No results for input(s): AMMONIA in the last 168 hours. CBC: No results for input(s): WBC, NEUTROABS, HGB, HCT, MCV, PLT in the last 168 hours. Cardiac Enzymes: No results for input(s): CKTOTAL, CKMB, CKMBINDEX, TROPONINI in the last 168 hours. BNP: Invalid input(s): POCBNP CBG: No results for input(s): GLUCAP in the last 168 hours. D-Dimer No results for input(s): DDIMER in the last 72 hours. Hgb A1c No results for input(s): HGBA1C in the last 72 hours. Lipid Profile No results for input(s): CHOL, HDL, LDLCALC, TRIG, CHOLHDL, LDLDIRECT in the last 72 hours. Thyroid function studies No results for input(s): TSH, T4TOTAL, T3FREE, THYROIDAB in the last 72 hours.  Invalid input(s): FREET3 Anemia work up No results for input(s): VITAMINB12, FOLATE, FERRITIN, TIBC, IRON, RETICCTPCT in the last 72 hours. Urinalysis    Component Value Date/Time   COLORURINE STRAW (A) 05/09/2019 0931   APPEARANCEUR CLEAR 05/09/2019 0931   APPEARANCEUR Clear 05/23/2015 1447   LABSPEC 1.006 05/09/2019 0931   PHURINE 5.0 05/09/2019 0931   GLUCOSEU NEGATIVE 05/09/2019 0931   HGBUR NEGATIVE 05/09/2019 0931   BILIRUBINUR NEGATIVE 05/09/2019 0931   BILIRUBINUR Negative 05/23/2015 1447   KETONESUR NEGATIVE 05/09/2019 0931   PROTEINUR  NEGATIVE 05/09/2019 0931   NITRITE NEGATIVE 05/09/2019 0931   LEUKOCYTESUR NEGATIVE 05/09/2019 0931   Sepsis Labs Invalid input(s): PROCALCITONIN,  WBC,  LACTICIDVEN Microbiology Recent Results (from the past 240 hour(s))  SARS CORONAVIRUS 2 (TAT 6-24 HRS) Nasopharyngeal Nasopharyngeal Swab     Status: None   Collection Time: 05/09/19  8:40 AM   Specimen: Nasopharyngeal Swab  Result Value Ref Range Status   SARS Coronavirus 2 NEGATIVE NEGATIVE Final    Comment: (NOTE) SARS-CoV-2 target nucleic acids are NOT DETECTED. The SARS-CoV-2 RNA is generally detectable in upper and lower respiratory specimens during the acute phase of infection. Negative results do not preclude SARS-CoV-2 infection, do not rule out co-infections with other pathogens, and should not be used as the sole basis for treatment or other patient management decisions. Negative results must be combined with clinical observations, patient history, and epidemiological information. The expected result is Negative. Fact Sheet for Patients: HairSlick.no Fact Sheet for Healthcare Providers: quierodirigir.com This test is not yet approved or cleared by the [place] FDA and  has been authorized for detection and/or diagnosis of SARS-CoV-2 by FDA under an Emergency Use Authorization (EUA). This EUA will remain  in effect (meaning this test can be used) for the duration of the COVID-19 declaration under Section 56 4(b)(1) of the Act, 21 U.S.C. section 360bbb-3(b)(1), unless the authorization is terminated or revoked sooner. Performed at [facility], [address] [place], [place] [address]   MRSA PCR Screening     Status: Abnormal   Collection Time: 05/10/19  5:53 AM   Specimen: Nasopharyngeal  Result Value Ref Range Status   MRSA by PCR POSITIVE (A) NEGATIVE Final    Comment:        The GeneXpert MRSA Assay (FDA approved for NASAL specimens only), is  one component of a comprehensive MRSA colonization surveillance program. It  is not intended to diagnose MRSA infection nor to guide or monitor treatment for MRSA infections. RESULT CALLED TO, READ BACK BY AND VERIFIED WITH: K.BEACER RN AT 0740 05/10/2019 BY A.DAVIS Performed at [facility], [address] [place], [place] [address]   SARS CORONAVIRUS 2 (TAT 6-24 HRS) Nasopharyngeal Nasopharyngeal Swab     Status: None   Collection Time: 05/14/19 11:05 AM   Specimen: Nasopharyngeal Swab  Result Value Ref Range Status   SARS Coronavirus 2 NEGATIVE NEGATIVE Final    Comment: (NOTE) SARS-CoV-2 target nucleic acids are NOT DETECTED. The SARS-CoV-2 RNA is generally detectable in upper and lower respiratory specimens during the acute phase of infection. Negative results do not preclude SARS-CoV-2 infection, do not rule out co-infections with other pathogens, and should not be used as the sole basis for treatment or other patient management decisions. Negative results must be combined with clinical observations, patient history, and epidemiological information. The expected result is Negative. Fact Sheet for Patients: HairSlick.no Fact Sheet for Healthcare Providers: quierodirigir.com This test is not yet approved or cleared by the [place] FDA and  has been authorized for detection and/or diagnosis of SARS-CoV-2 by FDA under an Emergency Use Authorization (EUA). This EUA will remain  in effect (meaning this test can be used) for the duration of the COVID-19 declaration under Section 56 4(b)(1) of the Act, 21 U.S.C. section 360bbb-3(b)(1), unless the authorization is terminated or revoked sooner. Performed at [facility], [address], [place], [place] [address]      Time coordinating discharge: Over 30 minutes  SIGNED:   Lynn Ito, MD  [facility] 05/17/2019, 10:51 AM Pager   If 7PM-7AM,  please contact night-coverage [email or web address] Password [email or web address]

## 2019-05-17 NOTE — Assessment & Plan Note (Addendum)
Long-term risk discussed with the patient.  Sleep study recommended following discharge from the SNF to R/O OSA.

## 2019-05-17 NOTE — Assessment & Plan Note (Signed)
Isometrics recommended prior to standing from a supine or sitting position. Metoprolol dose will be decreased due to relative hypotension.

## 2019-05-18 ENCOUNTER — Telehealth: Payer: Self-pay

## 2019-05-18 ENCOUNTER — Other Ambulatory Visit: Payer: Self-pay | Admitting: Family

## 2019-05-18 DIAGNOSIS — F332 Major depressive disorder, recurrent severe without psychotic features: Secondary | ICD-10-CM

## 2019-05-18 NOTE — Telephone Encounter (Signed)
Patient requesting citlopram refill. Pending refill and routing to provider for approval. High allergy / contraindication alert warning

## 2019-05-18 NOTE — Telephone Encounter (Signed)
Tried contacting patient regarding need for colonoscopy. No answer. LMOM for patient to return call to our office.

## 2019-05-20 ENCOUNTER — Telehealth: Payer: Self-pay | Admitting: *Deleted

## 2019-05-20 NOTE — Telephone Encounter (Signed)
I agree.Her pain medication can be refilled by provider at [facility].

## 2019-05-20 NOTE — Telephone Encounter (Signed)
Patient called requesting refill on her Oxycodone. Patient is still in [facility].  Explained to patient that we would not be able to refill her Rx until she is released from the facility. Patient got upset and hung up on me.

## 2019-05-25 NOTE — Telephone Encounter (Signed)
Patient is currently being treated for rehab in [place]. Unable to reach patient.

## 2019-05-30 NOTE — Telephone Encounter (Signed)
On chart review I don't see a discharge note.please clarify with patient when she was discharged from the facility and check data base if pain medication was prescribed.

## 2019-05-30 NOTE — Telephone Encounter (Signed)
Patient called again and states she would like a refill for her oxycodone. She states she has been in severe pain and was discharged from [facility] and is now home. I informed patient I would need to send to provider for approval on refill. She also stated she would like to speak with our office manager and I provided her a number to reach manager.   Patients last refill for oxycodone 15 was 04/21/2019. Routing to provider for further review and approval.

## 2019-05-31 MED ORDER — OXYCODONE HCL 15 MG PO TABS: 15.0000 mg | ORAL_TABLET | Freq: Four times a day (QID) | ORAL | 0 refills | Status: DC | PRN

## 2019-05-31 NOTE — Telephone Encounter (Signed)
Will need patient to schedule appointment to sign pain medication contract for prior to next pain medication refill.

## 2019-05-31 NOTE — Telephone Encounter (Signed)
Spoke with patient again, patient stated she was discharged  05/20/19 and was dispensed no medications.  Patient went on to say she will be traveling out of town on 12/3 and would like to get medication refilled before then. Unable to check database at this time. Last refill showing was 04/21/2019.

## 2019-05-31 NOTE — Telephone Encounter (Signed)
Refill medication

## 2019-06-01 NOTE — Telephone Encounter (Signed)
Tried calling patient to discuss need for pain medication contract appointment. No answer and  Mail box was full to voicemail. Will try again later.

## 2019-06-07 NOTE — Telephone Encounter (Signed)
Tried calling patient again to see if she would like to set up appointment. No answer and unable to leave message. Mailbox is full.

## 2019-06-13 ENCOUNTER — Telehealth: Payer: Self-pay

## 2019-06-13 ENCOUNTER — Encounter: Payer: Medicare HMO | Admitting: Family

## 2019-06-13 ENCOUNTER — Other Ambulatory Visit: Payer: Self-pay

## 2019-06-13 NOTE — Telephone Encounter (Signed)
Tried calling patient for telephone visit. Have tried multiple attempts to contact patient this was 3rd attempt. No answer. Left message for patient to call office to reschedule appointment. Will cancel appointment and patient will need to be rescheduled.

## 2019-06-28 ENCOUNTER — Other Ambulatory Visit: Payer: Self-pay | Admitting: *Deleted

## 2019-06-28 NOTE — Telephone Encounter (Signed)
Patient requested refill Appointment scheduled for 07/04/2019 for Narcotic Contract update.  [place] Verified LR: 05/31/2019 Pended Rx and sent to [facility] for approval due to [place] Silversmith out of office.

## 2019-06-29 MED ORDER — OXYCODONE HCL 15 MG PO TABS: 15.0000 mg | ORAL_TABLET | Freq: Four times a day (QID) | ORAL | 0 refills | Status: DC | PRN

## 2019-06-29 NOTE — Telephone Encounter (Signed)
Patient called back requesting Rx. Stated that she is trying to coordinate with [facility] transportation to pick up all medications at the same time.

## 2019-07-04 ENCOUNTER — Encounter: Payer: Self-pay | Admitting: Family

## 2019-07-04 ENCOUNTER — Other Ambulatory Visit: Payer: Self-pay

## 2019-07-04 ENCOUNTER — Ambulatory Visit (INDEPENDENT_AMBULATORY_CARE_PROVIDER_SITE_OTHER): Payer: Medicare HMO | Admitting: Family

## 2019-07-04 DIAGNOSIS — R2681 Unsteadiness on feet: Secondary | ICD-10-CM | POA: Diagnosis not present

## 2019-07-04 DIAGNOSIS — M797 Fibromyalgia: Secondary | ICD-10-CM

## 2019-07-04 DIAGNOSIS — G894 Chronic pain syndrome: Secondary | ICD-10-CM

## 2019-07-04 DIAGNOSIS — R296 Repeated falls: Secondary | ICD-10-CM

## 2019-07-04 NOTE — Progress Notes (Signed)
This service is provided via telemedicine  No vital signs collected/recorded due to the encounter was a telemedicine visit.   Location of patient (ex: home, work):  Home   Patient consents to a telephone visit:  yes  Location of the provider (ex: office, home):  Office   Name of any referring provider:  N/A  Names of all persons participating in the telemedicine service and their role in the encounter: Richarda Blade, NP, Asher Muir CMA, and patient   Time spent on call: Asher Muir CMA spent 12 minutes on phone with patient    Provider: Eriyonna Matsushita FNP-C  Jacklyn Branan, [name], NP  Patient Care Team: Kelijah Towry, [name], NP as PCP - General (Family Medicine)  Extended Emergency Contact Information Primary Emergency Contact: Lewis,Derrick  [place] Phone: [phone] Relation: Son Secondary Emergency Contact: Jerrye Beavers [place] Mobile Phone: [phone] Relation: Daughter  Code Status:  Full Code  Goals of care: Advanced Directive information Advanced Directives 05/10/2019  Does Patient Have a Medical Advance Directive? No  Type of Advance Directive -  Does patient want to make changes to medical advance directive? -  Copy of Healthcare Power of Attorney in Chart? -  Would patient like information on creating a medical advance directive? -     Chief Complaint  Patient presents with  . Follow-up    Narcotic contract update and would like referral to home health. Patient is also concerned with pain in legs and standing   . Medication Management    would like to discuss zinc sulfate and amitriptyline     HPI:  Pt is a 69 y.o. female seen today for an acute visit for evaluation of pain.she complains of chronic pain on the back,shoulders and fibromyalgia.she states take oxycodone 15 mg tablet IR every 6 hours and has added  two 500 mg tablet tylenol in between her oxycodone 15 mg tablet IR.she complains of generalized  weakness states unable to stand for prolong time due to pain on her back.states not able to cook for her self or take showers.she has had fall episode x 2 states one episode prior to [place] while traveling in [place] and another episode after she arrived home.she states her TV fell on back had to call her daughter to assist her.she states unable to prepare cook for herself due to unable to stand too long.she request for a Home health Nurse assistant to assist her with her activity of daily living.   Past Medical History:  Diagnosis Date  . Acute kidney injury (HCC)   . Allergy   . Anxiety   . Arthritis    "99% of my body" (11/11/2016)  . Asthma   . Bursitis of both hips   . Cataract   . Cervical scoliosis   . Chronic back pain    "all over my back" (11/11/2016)  . Depressive disorder, not elsewhere classified   . Enthesopathy of hip region   . Environmental allergies    "I take Claritin qd; 365 days/year" (11/11/2016)  . Essential hypertension, benign   . Fibromyalgia   . Frequent falls   . GERD (gastroesophageal reflux disease)   . Headache    "at least 1/wk; may last for 2 days or so" (11/11/2016)  . History of blood transfusion 1985   w/hysterectomy  . Lumbar stenosis   . Migraine    "a few/month" (11/11/2016)  . Muscle weakness (generalized)   . Myalgia and myositis, unspecified   .  Osteoporosis   . Other abnormal blood chemistry   . Other malaise and fatigue   . Raynaud's syndrome   . Sciatic nerve pain, left   . Sciatica   . Unspecified vitamin D deficiency    Past Surgical History:  Procedure Laterality Date  . ABDOMINAL HYSTERECTOMY  1985  . CESAREAN SECTION  1976; 1979  . SHOULDER OPEN ROTATOR CUFF REPAIR Left     No Known Allergies  Outpatient Encounter Medications as of 07/04/2019  Medication Sig  . acetaminophen (TYLENOL) 500 MG tablet Take 500 mg by mouth every 6 (six) hours as needed for moderate pain or fever.  Marland Kitchen albuterol (VENTOLIN HFA) 108 (90 Base)  MCG/ACT inhaler INHALE 2 PUFFS  EVERY 6 (SIX) HOURS AS NEEDED FOR WHEEZING OR SHORTNESS OF BREATH.  Marland Kitchen amitriptyline (ELAVIL) 150 MG tablet Take 150 mg by mouth at bedtime.  Marland Kitchen aspirin EC 81 MG tablet Take 81 mg by mouth daily.  . Cholecalciferol (VITAMIN D3) 2000 units TABS Take 2,000 Units by mouth daily.   . citalopram (CELEXA) 20 MG tablet TAKE 1 TABLET BY MOUTH EVERY DAY  . diclofenac sodium (VOLTAREN) 1 % GEL Apply 2 g topically 4 (four) times daily.  . famotidine (PEPCID) 20 MG tablet Take 1 tablet (20 mg total) by mouth at bedtime.  . fluticasone (FLONASE) 50 MCG/ACT nasal spray Place 2 sprays into both nostrils daily as needed for allergies or rhinitis.  . furosemide (LASIX) 40 MG tablet TAKE 1 TABLET BY MOUTH EVERY DAY  . gabapentin (NEURONTIN) 400 MG capsule TAKE 3 CAPSULES TWICE DAILY  AND TAKE 2 CAPSULES AT BEDTIME  FOR  NEUROPATHY  . loratadine (CLARITIN) 10 MG tablet Take 10 mg by mouth daily.  . metoprolol tartrate (LOPRESSOR) 50 MG tablet Take one tablet by mouth twice daily for blood pressure  . oxyCODONE (ROXICODONE) 15 MG immediate release tablet Take 1 tablet (15 mg total) by mouth every 6 (six) hours as needed for pain.  . polyvinyl alcohol (LIQUIFILM TEARS) 1.4 % ophthalmic solution Place 2 drops into both eyes as needed for dry eyes.  . SUMAtriptan (IMITREX) 25 MG tablet TAKE 1 TAB EVERY 2 HRS AS NEEDED FOR MIGRAINE OR HEADACHE. MAY REPEAT IN 2 HRS IF PERSISTS/RECURS.  . vitamin B-12 (CYANOCOBALAMIN) 500 MCG tablet Take 500 mcg by mouth daily.  . vitamin C (VITAMIN C) 500 MG tablet Take 1 tablet (500 mg total) by mouth 2 (two) times daily.  Marland Kitchen zinc sulfate 220 (50 Zn) MG capsule Take 220 mg by mouth daily.  . [DISCONTINUED] amitriptyline (ELAVIL) 150 MG tablet Take 1 tablet (150 mg total) by mouth at bedtime. For mood   No facility-administered encounter medications on file as of 07/04/2019.    Review of Systems  Constitutional: Negative for appetite change, chills, fatigue  and fever.  HENT: Negative for congestion, rhinorrhea, sinus pressure, sinus pain, sneezing and sore throat.   Eyes: Negative for discharge, redness and itching.  Respiratory: Negative for cough, chest tightness, shortness of breath and wheezing.   Cardiovascular: Negative for chest pain, palpitations and leg swelling.  Gastrointestinal: Negative for abdominal distention, abdominal pain, constipation, diarrhea, nausea and vomiting.  Genitourinary: Negative for difficulty urinating, dysuria, flank pain, frequency and urgency.  Musculoskeletal: Positive for arthralgias, back pain, gait problem and myalgias.  Skin: Negative for color change, pallor and rash.  Neurological: Negative for dizziness, speech difficulty, light-headedness and headaches.  Hematological: Does not bruise/bleed easily.  Psychiatric/Behavioral: Negative for agitation, confusion and sleep disturbance.  The patient is not nervous/anxious.     Immunization History  Administered Date(s) Administered  . Fluad Quad(high Dose 65+) 03/29/2019  . Influenza,inj,Quad PF,6+ Mos 05/23/2015, 05/28/2016, 03/24/2018  . Influenza-Unspecified 07/07/2012, 02/28/2014  . Pneumococcal Conjugate-13 07/14/2014  . Pneumococcal Polysaccharide-23 09/11/2017  . Tdap 01/26/2016   Pertinent  Health Maintenance Due  Topic Date Due  . COLONOSCOPY  10/18/2001  . MAMMOGRAM  09/04/2019  . INFLUENZA VACCINE  Completed  . DEXA SCAN  Completed  . PNA vac Low Risk Adult  Completed   Fall Risk  07/04/2019 03/29/2019 12/03/2018 10/26/2018 10/04/2018  Falls in the past year? 1 1 1 1  0  Number falls in past yr: 1 1 1 1  0  Comment - - - - -  Injury with Fall? 1 0 0 1 0  Comment - - - - -  Risk Factor Category  - - - - -  Risk for fall due to : - - - - -  Follow up - - - - -   There were no vitals filed for this visit. There is no height or weight on file to calculate BMI. Physical Exam Unable to complete on telephone visit   Labs reviewed: Recent Labs     10/10/18 0402 10/11/18 0344 10/11/18 0344 10/12/18 0402 10/28/18 0000 05/10/19 0411 05/11/19 0325 05/13/19 0429  NA 138 135   < > 136   < > 141 139 137  K 4.0 3.6  --  4.1  --  4.0 4.1 3.9  CL 98 96*  --  99   < > 103 103 101  CO2 26 25  --  24   < > 26 26 28   GLUCOSE 89 90  --  87   < > 108* 99 95  BUN 14 16   < > 13   < > 16 19 20   CREATININE 0.72 0.87   < > 0.89   < > 1.54* 1.54* 1.55*  CALCIUM 9.1 8.9  --  9.0   < > 8.8* 8.5* 8.8*  MG 2.2 2.2  --  2.1  --   --   --   --    < > = values in this interval not displayed.   Recent Labs    10/10/18 0402 10/11/18 0344 10/28/18 0000 10/28/18 1330 03/29/19 1011  AST 21 19 11* 11 11  ALT 14 14 9 9 6   ALKPHOS 78 71 61 61  --   BILITOT 0.5 0.7  --  0.6 0.8  PROT 7.5 7.5  --  7.0 6.8  ALBUMIN 3.0* 3.2*  --  4.0  --    Recent Labs    10/11/18 0344 10/28/18 0000 10/28/18 1330 03/29/19 1011 05/09/19 0328 05/10/19 0411  WBC  --  5.3 5.3 5.7 6.8 6.1  NEUTROABS  --  2 1.9 2,491  --   --   HGB   < > 11.8* 11.8 11.3* 10.9* 11.3*  HCT   < > 35* 35.0 34.7* 35.5* 35.5*  MCV  --   --  81 85.3 91.0 88.3  PLT   < > 422* 422 334 351 354   < > = values in this interval not displayed.   Lab Results  Component Value Date   TSH 1.346 05/09/2019   Lab Results  Component Value Date   HGBA1C 5.3 03/29/2019   Lab Results  Component Value Date   CHOL 182 03/29/2019   HDL 62 03/29/2019   LDLCALC 102 (  H) 03/29/2019   TRIG 89 03/29/2019   CHOLHDL 2.9 03/29/2019    Significant Diagnostic Results in last 30 days:  No results found.  Assessment/Plan 1. Fibromyalgia Chronic.continue on current regimen. - Ambulatory referral to Pain Clinic  2. Chronic pain syndrome Reports chronic back pain and shoulders.Has required two 500 mg  tylenol in between her every 6 hours oxycodone.reports pain not under control.Use of pain medication and overdose risk discussed.Agree for referral to pain management clinic.due to pain medication  contract update.Will mail for patient to sign and return to [facility] office.will need UDS at the office.  Will refer to pain management clinic.   - Ambulatory referral to Pain Clinic  3. Unsteady gait Status post fall x 2 fall and safety precaution advised.requires assistance with her ADL's. - Ambulatory referral to Home Health for Nursing assistant to assist with her ADL's.Physical therapy for ROM,exercise,gait stability and muscle strengthening.   4. Frequent Falls  She remains high risk for falls due to pain  Home health Physical therapy as above.   Family/ staff Communication: Reviewed plan of care with patient  Labs/tests ordered: None   Spent 15 minutes of non-face to face with patient    Caesar Bookman, NP

## 2019-07-15 ENCOUNTER — Other Ambulatory Visit: Payer: Self-pay

## 2019-07-15 ENCOUNTER — Emergency Department (HOSPITAL_COMMUNITY)
Admission: EM | Admit: 2019-07-15 | Discharge: 2019-07-15 | Disposition: A | Discharge status: Home / Self Care | Payer: Medicare HMO | Attending: Emergency Medicine | Admitting: Emergency Medicine

## 2019-07-15 ENCOUNTER — Emergency Department (HOSPITAL_COMMUNITY): Payer: Medicare HMO

## 2019-07-15 DIAGNOSIS — M6281 Muscle weakness (generalized): Secondary | ICD-10-CM | POA: Diagnosis not present

## 2019-07-15 DIAGNOSIS — R0789 Other chest pain: Secondary | ICD-10-CM | POA: Diagnosis not present

## 2019-07-15 DIAGNOSIS — M542 Cervicalgia: Secondary | ICD-10-CM | POA: Diagnosis not present

## 2019-07-15 DIAGNOSIS — M5489 Other dorsalgia: Secondary | ICD-10-CM | POA: Diagnosis not present

## 2019-07-15 DIAGNOSIS — Z5321 Procedure and treatment not carried out due to patient leaving prior to being seen by health care provider: Secondary | ICD-10-CM | POA: Insufficient documentation

## 2019-07-15 DIAGNOSIS — I959 Hypotension, unspecified: Secondary | ICD-10-CM | POA: Diagnosis not present

## 2019-07-15 DIAGNOSIS — R079 Chest pain, unspecified: Secondary | ICD-10-CM | POA: Diagnosis not present

## 2019-07-15 LAB — CBC
HCT: 38.6 % (ref 36.0–46.0)
Hemoglobin: 11.9 g/dL — ABNORMAL LOW (ref 12.0–15.0)
MCH: 27.9 pg (ref 26.0–34.0)
MCHC: 30.8 g/dL (ref 30.0–36.0)
MCV: 90.4 fL (ref 80.0–100.0)
Platelets: 331 10*3/uL (ref 150–400)
RBC: 4.27 MIL/uL (ref 3.87–5.11)
RDW: 12.8 % (ref 11.5–15.5)
WBC: 6 10*3/uL (ref 4.0–10.5)
nRBC: 0 % (ref 0.0–0.2)

## 2019-07-15 LAB — BASIC METABOLIC PANEL
Anion gap: 11 (ref 5–15)
BUN: 18 mg/dL (ref 8–23)
CO2: 26 mmol/L (ref 22–32)
Calcium: 9 mg/dL (ref 8.9–10.3)
Chloride: 101 mmol/L (ref 98–111)
Creatinine, Ser: 1.49 mg/dL — ABNORMAL HIGH (ref 0.44–1.00)
GFR calc Af Amer: 42 mL/min — ABNORMAL LOW (ref >60)
GFR calc non Af Amer: 36 mL/min — ABNORMAL LOW (ref >60)
Glucose, Bld: 81 mg/dL (ref 70–99)
Potassium: 3.8 mmol/L (ref 3.5–5.1)
Sodium: 138 mmol/L (ref 135–145)

## 2019-07-15 LAB — TROPONIN I (HIGH SENSITIVITY): Troponin I (High Sensitivity): <2 ng/L (ref <18)

## 2019-07-15 MED ORDER — SODIUM CHLORIDE 0.9% FLUSH: 3.0000 mL | Freq: Once | INTRAVENOUS | Status: DC

## 2019-07-15 NOTE — ED Notes (Signed)
Pt called twice with no response for vitals and triage for lab collection.

## 2019-07-15 NOTE — Progress Notes (Signed)
This encounter was created in error - please disregard.

## 2019-07-15 NOTE — ED Triage Notes (Signed)
Pt called EMS d/t fall while reaching for her coat today. Pt did not get dizzy, did not pass out, pt just sts her legs have been very weak recently. Pt has fibromyalgia and chronic neck and back pain, denies injury from fall. Also has R sided chest pain x 2 days, increases with breathing and palpation. No change in pain with nitro and ASA.

## 2019-07-15 NOTE — ED Notes (Signed)
Pt not visible in lobby and called three times by staff.

## 2019-07-18 ENCOUNTER — Ambulatory Visit: Payer: Medicare HMO | Admitting: Family

## 2019-07-19 ENCOUNTER — Ambulatory Visit: Payer: Medicare HMO | Admitting: Family

## 2019-07-20 ENCOUNTER — Ambulatory Visit
Admission: RE | Admit: 2019-07-20 | Discharge: 2019-07-20 | Disposition: A | Discharge status: Home / Self Care | Payer: Medicare HMO | Source: Ambulatory Visit | Attending: Family | Admitting: Family

## 2019-07-20 ENCOUNTER — Encounter: Payer: Self-pay | Admitting: Family

## 2019-07-20 ENCOUNTER — Ambulatory Visit (INDEPENDENT_AMBULATORY_CARE_PROVIDER_SITE_OTHER): Payer: Medicare HMO | Admitting: Family

## 2019-07-20 ENCOUNTER — Other Ambulatory Visit: Payer: Self-pay

## 2019-07-20 VITALS — BP 110/58 | HR 60 | Temp 97.8°F | Resp 18 | Ht 66.0 in

## 2019-07-20 DIAGNOSIS — R2681 Unsteadiness on feet: Secondary | ICD-10-CM

## 2019-07-20 DIAGNOSIS — R296 Repeated falls: Secondary | ICD-10-CM

## 2019-07-20 DIAGNOSIS — R55 Syncope and collapse: Secondary | ICD-10-CM

## 2019-07-20 DIAGNOSIS — M79661 Pain in right lower leg: Secondary | ICD-10-CM | POA: Diagnosis not present

## 2019-07-20 DIAGNOSIS — S8991XA Unspecified injury of right lower leg, initial encounter: Secondary | ICD-10-CM | POA: Diagnosis not present

## 2019-07-20 DIAGNOSIS — M25561 Pain in right knee: Secondary | ICD-10-CM | POA: Diagnosis not present

## 2019-07-20 DIAGNOSIS — M25571 Pain in right ankle and joints of right foot: Secondary | ICD-10-CM

## 2019-07-20 DIAGNOSIS — G894 Chronic pain syndrome: Secondary | ICD-10-CM

## 2019-07-20 DIAGNOSIS — M797 Fibromyalgia: Secondary | ICD-10-CM

## 2019-07-20 LAB — GLUCOSE, POCT (MANUAL RESULT ENTRY): POC Glucose: 79 mg/dl (ref 70–99)

## 2019-07-20 NOTE — Progress Notes (Signed)
Provider: Richarda Blade FNP-C  Jada Fass, [name], NP  Patient Care Team: Lucynda Rosano, [name], NP as PCP - General (Family Medicine)  Extended Emergency Contact Information Primary Emergency Contact: Dutch,Lashawn Mobile Phone: [phone] Relation: Daughter Secondary Emergency Contact: Lewis,Derrick  [place] Mobile Phone: [phone] Relation: Son  Code Status:  Full Code  Goals of care: Advanced Directive information Advanced Directives 05/10/2019  Does Patient Have a Medical Advance Directive? No  Type of Advance Directive -  Does patient want to make changes to medical advance directive? -  Copy of Healthcare Power of Attorney in Chart? -  Would patient like information on creating a medical advance directive? -     Chief Complaint  Patient presents with  . Acute Visit    falls x3, asking for home health    HPI:  Pt is a 69 y.o. female seen today for an acute visit for evaluation of right knee,leg and ankle pain post multiple fall episodes x 3 over the weekend.she is here with her daughter who came from [place] to bring her to visit today.she states has had multiple falls in her senior living apartment.thinks has fallen x 6 times in the past week.she states fell at night thinks it was Saturday night going into Sunday morning in the bathroom was unable to get up so she called her daughter who lives in [place] to help her up. she states her knee and ankle are very painful unable to bear weight.she reports tends to fall on her right side every time she falls.she was taken to the ED via Ambulance 07/15/2019 chest X-ray was ordered since she had right chest pain.she states left the ED because the waiting time was 2-3 hours and was in a lot of pain.She states right leg feels numb.  Daughter states came in at 2 am from [place] states patient has not eaten for the past three days.she is not able to prepare food.she has another daughter who lives here in [place]  who rarely comes to help her but calls her when she falls.Daughter would like to take patient with her today to [place].she is currently working from home and has teenager aged children who can assist with her care.she would like Home health orders for a physical therapist to assist with patient gait training.I've discussed with patient and daughter today at length high risk of patient living by herself.Advised if not able to transfer patient to [facility] will need to consider higher level of care in Assisted Living.Both patient and daughter states not ready will try taking her to [facility] with her and work with Therapy.   She is also here to sign her narcotic uses contract.cureently on oxycodone 15 mg IR every 6 hours as needed for fibromyalgia.Previously referred to pain management clinic but has been able to get out of the house due to her frequent falls. She denies any recent contact with person with COVID-19.she continues to wear facial mask.   Past Medical History:  Diagnosis Date  . Acute kidney injury (HCC)   . Allergy   . Anxiety   . Arthritis    "99% of my body" (11/11/2016)  . Asthma   . Bursitis of both hips   . Cataract   . Cervical scoliosis   . Chronic back pain    "all over my back" (11/11/2016)  . Depressive disorder, not elsewhere classified   . Enthesopathy of hip region   . Environmental allergies    "I take Claritin qd; 365 days/year" (  11/11/2016)  . Essential hypertension, benign   . Fibromyalgia   . Frequent falls   . GERD (gastroesophageal reflux disease)   . Headache    "at least 1/wk; may last for 2 days or so" (11/11/2016)  . History of blood transfusion 1985   w/hysterectomy  . Lumbar stenosis   . Migraine    "a few/month" (11/11/2016)  . Muscle weakness (generalized)   . Myalgia and myositis, unspecified   . Osteoporosis   . Other abnormal blood chemistry   . Other malaise and fatigue   . Raynaud's syndrome   . Sciatic nerve pain, left   . Sciatica   .  Unspecified vitamin D deficiency    Past Surgical History:  Procedure Laterality Date  . ABDOMINAL HYSTERECTOMY  1985  . [place]; 1979  . SHOULDER OPEN ROTATOR CUFF REPAIR Left     No Known Allergies  Outpatient Encounter Medications as of 07/20/2019  Medication Sig  . acetaminophen (TYLENOL) 500 MG tablet Take 500 mg by mouth every 6 (six) hours as needed for moderate pain or fever.  Marland Kitchen amitriptyline (ELAVIL) 150 MG tablet Take 150 mg by mouth at bedtime.  Marland Kitchen aspirin EC 81 MG tablet Take 81 mg by mouth daily.  . Cholecalciferol (VITAMIN D3) 2000 units TABS Take 2,000 Units by mouth daily.   . citalopram (CELEXA) 20 MG tablet TAKE 1 TABLET BY MOUTH EVERY DAY  . famotidine (PEPCID) 20 MG tablet Take 1 tablet (20 mg total) by mouth at bedtime.  . fluticasone (FLONASE) 50 MCG/ACT nasal spray Place 2 sprays into both nostrils daily as needed for allergies or rhinitis.  . furosemide (LASIX) 40 MG tablet TAKE 1 TABLET BY MOUTH EVERY DAY  . gabapentin (NEURONTIN) 400 MG capsule TAKE 3 CAPSULES TWICE DAILY  AND TAKE 2 CAPSULES AT BEDTIME  FOR  NEUROPATHY  . loratadine (CLARITIN) 10 MG tablet Take 10 mg by mouth daily.  . metoprolol tartrate (LOPRESSOR) 50 MG tablet Take one tablet by mouth twice daily for blood pressure  . oxyCODONE (ROXICODONE) 15 MG immediate release tablet Take 1 tablet (15 mg total) by mouth every 6 (six) hours as needed for pain.  . SUMAtriptan (IMITREX) 25 MG tablet TAKE 1 TAB EVERY 2 HRS AS NEEDED FOR MIGRAINE OR HEADACHE. MAY REPEAT IN 2 HRS IF PERSISTS/RECURS.  . vitamin B-12 (CYANOCOBALAMIN) 500 MCG tablet Take 500 mcg by mouth daily.  . vitamin C (VITAMIN C) 500 MG tablet Take 1 tablet (500 mg total) by mouth 2 (two) times daily.  Marland Kitchen zinc sulfate 220 (50 Zn) MG capsule Take 220 mg by mouth daily.  . [DISCONTINUED] albuterol (VENTOLIN HFA) 108 (90 Base) MCG/ACT inhaler INHALE 2 PUFFS  EVERY 6 (SIX) HOURS AS NEEDED FOR WHEEZING OR SHORTNESS OF BREATH.  .  [DISCONTINUED] diclofenac sodium (VOLTAREN) 1 % GEL Apply 2 g topically 4 (four) times daily.  . [DISCONTINUED] polyvinyl alcohol (LIQUIFILM TEARS) 1.4 % ophthalmic solution Place 2 drops into both eyes as needed for dry eyes.   No facility-administered encounter medications on file as of 07/20/2019.    Review of Systems  Constitutional: Negative for chills, fatigue and fever.       Good appetite but per daughter has not eaten for about three days due to falls unable to prepare her meals.  HENT: Negative for congestion, rhinorrhea, sinus pressure, sinus pain, sneezing, sore throat and trouble swallowing.   Eyes: Negative for discharge, redness and itching.  Respiratory: Negative for cough,  chest tightness, shortness of breath and wheezing.   Cardiovascular: Negative for chest pain and palpitations.       Right ankle swelling   Gastrointestinal: Negative for abdominal distention, abdominal pain, constipation, diarrhea, nausea and vomiting.  Endocrine: Negative for cold intolerance, heat intolerance, polydipsia, polyphagia and polyuria.  Genitourinary: Negative for difficulty urinating, dysuria, flank pain, frequency and urgency.  Musculoskeletal: Positive for arthralgias, gait problem and myalgias.  Skin: Negative for color change, pallor, rash and wound.  Neurological: Negative for dizziness, speech difficulty, light-headedness and headaches.       Peripheral Neuropathy  Hematological: Does not bruise/bleed easily.  Psychiatric/Behavioral: Negative for agitation, confusion and sleep disturbance. The patient is not nervous/anxious.     Immunization History  Administered Date(s) Administered  . Fluad Quad(high Dose 65+) 03/29/2019  . Influenza,inj,Quad PF,6+ Mos 05/23/2015, 05/28/2016, 03/24/2018  . Influenza-Unspecified 07/07/2012, 02/28/2014  . Pneumococcal Conjugate-13 07/14/2014  . Pneumococcal Polysaccharide-23 09/11/2017  . Tdap 01/26/2016   Pertinent  Health Maintenance Due    Topic Date Due  . COLONOSCOPY  10/18/2001  . MAMMOGRAM  09/04/2019  . INFLUENZA VACCINE  Completed  . DEXA SCAN  Completed  . PNA vac Low Risk Adult  Completed   Fall Risk  07/20/2019 07/04/2019 03/29/2019 12/03/2018 10/26/2018  Falls in the past year? 1 1 1 1 1   Number falls in past yr: 1 1 1 1 1   Comment - - - - -  Injury with Fall? 1 1 0 0 1  Comment - - - - -  Risk Factor Category  - - - - -  Risk for fall due to : - - - - -  Follow up - - - - -      Vitals:   07/20/19 0839  BP: 120/80  Pulse: 74  Resp: 18  Temp: 97.8 F (36.6 C)  TempSrc: Oral  Height: 5\' 6"  (1.676 m)   Body mass index is 38.32 kg/m. Physical Exam Vitals reviewed.  Constitutional:      General: She is not in acute distress.    Appearance: She is obese. She is not ill-appearing.  HENT:     Head: Normocephalic.     Nose: No congestion or rhinorrhea.     Mouth/Throat:     Mouth: Mucous membranes are moist.     Pharynx: No oropharyngeal exudate or posterior oropharyngeal erythema.  Eyes:     General: No scleral icterus.       Right eye: No discharge.        Left eye: No discharge.     Extraocular Movements: Extraocular movements intact.     Conjunctiva/sclera: Conjunctivae normal.     Pupils: Pupils are equal, round, and reactive to light.  Neck:     Vascular: No carotid bruit.  Cardiovascular:     Rate and Rhythm: Normal rate and regular rhythm.     Pulses: Normal pulses.     Heart sounds: Normal heart sounds. No murmur. No friction rub. No gallop.   Pulmonary:     Effort: Pulmonary effort is normal. No respiratory distress.     Breath sounds: Normal breath sounds. No wheezing, rhonchi or rales.  Chest:     Chest wall: No tenderness.  Abdominal:     General: Bowel sounds are normal. There is no distension.     Palpations: Abdomen is soft. There is no mass.     Tenderness: There is no abdominal tenderness. There is no right CVA tenderness, left CVA tenderness, guarding  or rebound.   Musculoskeletal:     Cervical back: Normal range of motion. No rigidity or tenderness.     Right knee: No swelling, deformity, effusion, erythema or ecchymosis. Normal range of motion. Tenderness present over the medial joint line and lateral joint line.     Left knee: Normal.     Right lower leg: No edema.     Left lower leg: No edema.     Right ankle: Swelling present. No deformity or ecchymosis. Tenderness present over the lateral malleolus and medial malleolus. Decreased range of motion. Normal pulse.     Right Achilles Tendon: Normal.     Left ankle: Normal.     Comments: Unsteady gait on wheelchair during visit.right ankle non-pitting edema,tender to palpation.Pain with ROM.No redness noted.   Lymphadenopathy:     Cervical: No cervical adenopathy.  Skin:    General: Skin is warm.     Coloration: Skin is not pale.     Findings: No bruising or erythema.  Neurological:     Mental Status: She is alert and oriented to person, place, and time.     Cranial Nerves: No cranial nerve deficit.     Sensory: No sensory deficit.     Motor: No weakness.     Coordination: Coordination normal.     Gait: Gait normal.     Comments: Unsteady gait   Psychiatric:        Mood and Affect: Mood normal.        Behavior: Behavior normal.        Thought Content: Thought content normal.        Judgment: Judgment normal.    Labs reviewed: Recent Labs    10/10/18 0402 10/10/18 0402 10/11/18 0344 10/11/18 0344 10/12/18 0402 10/28/18 0000 05/11/19 0325 05/13/19 0429 07/15/19 1805  NA 138   < > 135   < > 136   < > 139 137 138  K 4.0   < > 3.6   < > 4.1   < > 4.1 3.9 3.8  CL 98   < > 96*   < > 99   < > 103 101 101  CO2 26   < > 25   < > 24   < > 26 28 26   GLUCOSE 89   < > 90   < > 87   < > 99 95 81  BUN 14   < > 16   < > 13   < > 19 20 18   CREATININE 0.72   < > 0.87   < > 0.89   < > 1.54* 1.55* 1.49*  CALCIUM 9.1   < > 8.9   < > 9.0   < > 8.5* 8.8* 9.0  MG 2.2  --  2.2  --  2.1  --   --   --    --    < > = values in this interval not displayed.   Recent Labs    10/10/18 0402 10/10/18 0402 10/11/18 0344 10/11/18 0344 10/28/18 0000 10/28/18 1330 03/29/19 1011  AST 21   < > 19   < > 11* 11 11  ALT 14   < > 14   < > 9 9 6   ALKPHOS 78   < > 71  --  61 61  --   BILITOT 0.5   < > 0.7  --   --  0.6 0.8  PROT 7.5   < > 7.5  --   --  7.0 6.8  ALBUMIN 3.0*  --  3.2*  --   --  4.0  --    < > = values in this interval not displayed.   Recent Labs    10/11/18 0344 10/28/18 0000 10/28/18 0000 10/28/18 1330 03/29/19 1011 03/29/19 1011 05/09/19 0328 05/10/19 0411 07/15/19 1805  WBC   < > 5.3   < > 5.3 5.7   < > 6.8 6.1 6.0  NEUTROABS  --  2  --  1.9 2,491  --   --   --   --   HGB   < > 11.8*  --  11.8 11.3*   < > 10.9* 11.3* 11.9*  HCT   < > 35*  --  35.0 34.7*   < > 35.5* 35.5* 38.6  MCV   < >  --   --  81 85.3   < > 91.0 88.3 90.4  PLT   < > 422*  --  422 334   < > 351 354 331   < > = values in this interval not displayed.   Lab Results  Component Value Date   TSH 1.346 05/09/2019   Lab Results  Component Value Date   HGBA1C 5.3 03/29/2019   Lab Results  Component Value Date   CHOL 182 03/29/2019   HDL 62 03/29/2019   LDLCALC 102 (H) 03/29/2019   TRIG 89 03/29/2019   CHOLHDL 2.9 03/29/2019    Significant Diagnostic Results in last 30 days:  DG Chest 2 View  Result Date: 07/15/2019 CLINICAL DATA:  Chest pain, body aches, sore throat shortness of breath for 3 days EXAM: CHEST - 2 VIEW COMPARISON:  Radiograph 10/06/2018 FINDINGS: Lung volumes are low with some streaky atelectatic changes in the lung bases. More prominent bandlike opacity in the left mid to lower lung is similar to priors and likely reflect some scarring. There is hazy interstitial opacity with cephalized, indistinct pulmonary vascularity. Cardiomediastinal contours are similar to prior. The aorta is calcified. The remaining cardiomediastinal contours are unremarkable. No acute osseous or soft  tissue abnormality. Degenerative changes are present in the imaged spine and shoulders. IMPRESSION: Lung volumes are low with basilar atelectasis. Some superimposed hazy opacities with indistinctness of the vascularity favor mild edema though early infection could have a similar appearance given the provided clinical setting. Aortic Atherosclerosis (ICD10-I70.0). Electronically Signed   By: Kreg Shropshire M.D.   On: 07/15/2019 18:26    Assessment/Plan 1. Right knee pain, unspecified chronicity Generalized knee tenderness with palpation no redness or effusion noted. - DG Tibia/Fibula Right; Future - DG Knee Complete 4 Views Right; Future - Ambulatory referral to Home Health  2. Acute right ankle pain Non-pitting edema noted.Medial and lateral joint tender to palpation.Pain with ROM but no redness and no increased warm to touch. - DG Ankle Complete Right; Future - Ambulatory referral to Home Health  3. Unsteady gait Uses wheelchair but states doors at her apartment are narrow unable to use wheelchair to the bathroom uses Rolator.Fall and safety precaution advised.will order Home Health Physical Therapy. - DG Tibia/Fibula Right; Future - DG Knee Complete 4 Views Right; Future - DG Ankle Complete Right; Future - Ambulatory referral to Home Health  4. Frequent falls Has had x 6 fall episodes over one week.No injury to head.Tend to fall on the right side.Went to ED 07/15/2019 but left due to long waiting time.Will have work with Home health Physical Therapist.Daughter has request orders for [facility] PT to Agency in  [place].She will call [facility] referral coordinator Sigmund Hazel at Telephone [phone] once she identifies [facility] agency that has Physical Therapy.Daughter taking patient with her to [place] today.Advised also to consider higher level of care such as a Rehab or assisted Living facility but birth patient and daughter not ready will try Therapy first.  - DG Tibia/Fibula Right; Future - DG Knee  Complete 4 Views Right; Future - DG Ankle Complete Right; Future - Ambulatory referral to Home Health  5. Chronic pain syndrome Narcotic use contract signed.Unable to obtained urine specimen for UDT per CMA patient passed out briefly in the bathroom.will collect Urine drug test on next visit.unable to reduce pain medication this visitRecommend following up with pain management clinic.   6. Fibromyalgia Continue current pain regimen.  7. Syncope, unspecified syncope type patient was in the toilet was called by CMA and daughter but was not responding.CMA reported patient passed out briefly.she was assisted back to wheelchair.Blood sugar checked was 79.Vital signs recheck was stable.Patient returned to her baseline. - POC Glucose (CBG)  Family/ staff Communication: Reviewed plan of care with patient and daughter.  Labs/tests ordered:   - DG Tibia/Fibula Right; Future - DG Knee Complete 4 Views Right; Future - DG Ankle Complete Right; Future  Next Appointment: 4 months for medical management of chronic issues.will need urine drug test next visit.  Caesar Bookman, NP

## 2019-07-20 NOTE — Patient Instructions (Signed)
- please Call [place] referral Co-ordinate Melinda [name] once you find a [place] in [place] that have an in home  Physical Therapy then will send [place] referral for Therapy. -Fall and safety precaution    Fall Prevention in the Home, Adult Falls can cause injuries. They can happen to people of all ages. There are many things you can do to make your home safe and to help prevent falls. Ask for help when making these changes, if needed. What actions can I take to prevent falls? General Instructions  Use good lighting in all rooms. Replace any light bulbs that burn out.  Turn on the lights when you go into a dark area. Use night-lights.  Keep items that you use often in easy-to-reach places. Lower the shelves around your home if necessary.  Set up your furniture so you have a clear path. Avoid moving your furniture around.  Do not have throw rugs and other things on the floor that can make you trip.  Avoid walking on wet floors.  If any of your floors are uneven, fix them.  Add color or contrast paint or tape to clearly mark and help you see: ? Any grab bars or handrails. ? First and last steps of stairways. ? Where the edge of each step is.  If you use a stepladder: ? Make sure that it is fully opened. Do not climb a closed stepladder. ? Make sure that both sides of the stepladder are locked into place. ? Ask someone to hold the stepladder for you while you use it.  If there are any pets around you, be aware of where they are. What can I do in the bathroom?      Keep the floor dry. Clean up any water that spills onto the floor as soon as it happens.  Remove soap buildup in the tub or shower regularly.  Use non-skid mats or decals on the floor of the tub or shower.  Attach bath mats securely with double-sided, non-slip rug tape.  If you need to sit down in the shower, use a plastic, non-slip stool.  Install grab bars by the toilet and in the tub and shower.  Do not use towel bars as grab bars. What can I do in the bedroom?  Make sure that you have a light by your bed that is easy to reach.  Do not use any sheets or blankets that are too big for your bed. They should not hang down onto the floor.  Have a firm chair that has side arms. You can use this for support while you get dressed. What can I do in the kitchen?  Clean up any spills right away.  If you need to reach something above you, use a strong step stool that has a grab bar.  Keep electrical cords out of the way.  Do not use floor polish or wax that makes floors slippery. If you must use wax, use non-skid floor wax. What can I do with my stairs?  Do not leave any items on the stairs.  Make sure that you have a light switch at the top of the stairs and the bottom of the stairs. If you do not have them, ask someone to add them for you.  Make sure that there are handrails on both sides of the stairs, and use them. Fix handrails that are broken or loose. Make sure that handrails are as long as the stairways.  Install non-slip stair  treads on all stairs in your home.  Avoid having throw rugs at the top or bottom of the stairs. If you do have throw rugs, attach them to the floor with carpet tape.  Choose a carpet that does not hide the edge of the steps on the stairway.  Check any carpeting to make sure that it is firmly attached to the stairs. Fix any carpet that is loose or worn. What can I do on the outside of my home?  Use bright outdoor lighting.  Regularly fix the edges of walkways and driveways and fix any cracks.  Remove anything that might make you trip as you walk through a door, such as a raised step or threshold.  Trim any bushes or trees on the path to your home.  Regularly check to see if handrails are loose or broken. Make sure that both sides of any steps have handrails.  Install guardrails along the edges of any raised decks and porches.  Clear walking  paths of anything that might make someone trip, such as tools or rocks.  Have any leaves, snow, or ice cleared regularly.  Use sand or salt on walking paths during winter.  Clean up any spills in your garage right away. This includes grease or oil spills. What other actions can I take?  Wear shoes that: ? Have a low heel. Do not wear high heels. ? Have rubber bottoms. ? Are comfortable and fit you well. ? Are closed at the toe. Do not wear open-toe sandals.  Use tools that help you move around (mobility aids) if they are needed. These include: ? Canes. ? Walkers. ? Scooters. ? Crutches.  Review your medicines with your doctor. Some medicines can make you feel dizzy. This can increase your chance of falling. Ask your doctor what other things you can do to help prevent falls. Where to find more information  Centers for Disease Control and Prevention, STEADI: HealthcareCounselor.com.pt  [employer] on Aging: RingConnections.si Contact a doctor if:  You are afraid of falling at home.  You feel weak, drowsy, or dizzy at home.  You fall at home. Summary  There are many simple things that you can do to make your home safe and to help prevent falls.  Ways to make your home safe include removing tripping hazards and installing grab bars in the bathroom.  Ask for help when making these changes in your home. This information is not intended to replace advice given to you by your health care provider. Make sure you discuss any questions you have with your health care provider. Document Revised: 10/07/2018 Document Reviewed: 01/29/2017 Elsevier Patient Education  2020 [employer].

## 2019-07-29 ENCOUNTER — Other Ambulatory Visit: Payer: Self-pay | Admitting: *Deleted

## 2019-07-29 ENCOUNTER — Ambulatory Visit: Payer: Medicare HMO | Admitting: Family

## 2019-07-29 MED ORDER — OXYCODONE HCL 15 MG PO TABS: 15.0000 mg | ORAL_TABLET | Freq: Four times a day (QID) | ORAL | 0 refills | Status: DC | PRN

## 2019-07-29 NOTE — Telephone Encounter (Signed)
Patient is requesting refill. Stated that she will be back in [place] this evening to pick up Medication.  NCCSRS Database Verified LR: 06/29/2019 Pended Rx and sent to [facility] for approval.

## 2019-08-10 DIAGNOSIS — M25561 Pain in right knee: Secondary | ICD-10-CM | POA: Diagnosis not present

## 2019-08-10 DIAGNOSIS — M25571 Pain in right ankle and joints of right foot: Secondary | ICD-10-CM | POA: Diagnosis not present

## 2019-08-10 DIAGNOSIS — I10 Essential (primary) hypertension: Secondary | ICD-10-CM | POA: Diagnosis not present

## 2019-08-11 ENCOUNTER — Other Ambulatory Visit: Payer: Self-pay | Admitting: Family

## 2019-08-19 DIAGNOSIS — M25561 Pain in right knee: Secondary | ICD-10-CM | POA: Diagnosis not present

## 2019-08-19 DIAGNOSIS — M25571 Pain in right ankle and joints of right foot: Secondary | ICD-10-CM | POA: Diagnosis not present

## 2019-08-19 DIAGNOSIS — I10 Essential (primary) hypertension: Secondary | ICD-10-CM | POA: Diagnosis not present

## 2019-08-24 ENCOUNTER — Telehealth: Payer: Self-pay | Admitting: Family

## 2019-08-24 NOTE — Telephone Encounter (Signed)
Home health PT report received for missed visit on 08/18/2019 due to inclement weather.Also had another missed visit 08/20/2019 due to patient not at home.Please call patient to contact [facility] due to missed visit on 08/20/2019.

## 2019-08-24 NOTE — Telephone Encounter (Signed)
Called pt and could not leave a message, VM was full Called pt's daughter, she lives in [place], and let her know that Home Health has been trying to do visits on patient and she's not been available. Per daughter she will try and reach out to her mother to see what's going on.

## 2019-08-25 NOTE — Telephone Encounter (Signed)
Tried calling home number, mailbox is full

## 2019-08-25 NOTE — Telephone Encounter (Signed)
I have been trying to reach patient, called both her daughters with no result, called her home number and VM is full. I will mail a letter to pt's home address and close out this phone note.

## 2019-08-26 ENCOUNTER — Other Ambulatory Visit: Payer: Self-pay | Admitting: *Deleted

## 2019-08-26 MED ORDER — OXYCODONE HCL 15 MG PO TABS: 15.0000 mg | ORAL_TABLET | Freq: Four times a day (QID) | ORAL | 0 refills | Status: DC | PRN

## 2019-08-26 NOTE — Telephone Encounter (Signed)
Patient requested refill NCCSRS Database Verified LR: 07/29/2019 Narcotic Contract updated.  Pended and sent to Shanda Bumps due to Carilyn Goodpasture out of office.

## 2019-08-27 ENCOUNTER — Other Ambulatory Visit: Payer: Self-pay | Admitting: Family

## 2019-08-29 ENCOUNTER — Telehealth: Payer: Self-pay

## 2019-08-29 DIAGNOSIS — M25571 Pain in right ankle and joints of right foot: Secondary | ICD-10-CM | POA: Diagnosis not present

## 2019-08-29 DIAGNOSIS — M25561 Pain in right knee: Secondary | ICD-10-CM | POA: Diagnosis not present

## 2019-08-29 DIAGNOSIS — I10 Essential (primary) hypertension: Secondary | ICD-10-CM | POA: Diagnosis not present

## 2019-08-29 NOTE — Telephone Encounter (Signed)
Betsy, PT from [facility], stated that patient had 2 falls in the last week.  She said she fell to her right side.  No specific injury but just sore all over.    I verified with PT that they are seeing her at [address]. Tamela Oddi was with her at the time she called to report falls.  The phone number is [phone].  I mentioned about [place] having a difficult time trying to contact the patient. She stated she did visit the daughter in [place] about a week ago.

## 2019-08-29 NOTE — Telephone Encounter (Signed)
Recommend patient to advance to higher level of care like assisted living facility for close supervision due to her frequent falls.Patient's daughter was supposed to taking her to [place] with on previous visit.

## 2019-08-30 NOTE — Telephone Encounter (Signed)
Will wait for paper work if not she will need a face to face visit for a new scooter.

## 2019-08-30 NOTE — Telephone Encounter (Signed)
Patient called asking for a order for a new scooter. Patient states she went out of town and left the key in her scooter and thinks a family member was Public relations account executive or playing with scooter and now it does not work.   Patient states her scooter previously came from a company called [employer]. I advised patient to contact the company and have them forwarded [place] the necessary paperwork to complete for her to get another scooter. Patient verbalized understanding.   Patient asked that I share the above information with Carilyn Goodpasture so she will be on the lookout for paperwork. Patient states she needs this ASAP for she is afraid of increased falling.

## 2019-09-02 DIAGNOSIS — M25561 Pain in right knee: Secondary | ICD-10-CM | POA: Diagnosis not present

## 2019-09-02 DIAGNOSIS — I10 Essential (primary) hypertension: Secondary | ICD-10-CM | POA: Diagnosis not present

## 2019-09-02 DIAGNOSIS — M25571 Pain in right ankle and joints of right foot: Secondary | ICD-10-CM | POA: Diagnosis not present

## 2019-09-05 DIAGNOSIS — I10 Essential (primary) hypertension: Secondary | ICD-10-CM | POA: Diagnosis not present

## 2019-09-05 DIAGNOSIS — M25571 Pain in right ankle and joints of right foot: Secondary | ICD-10-CM | POA: Diagnosis not present

## 2019-09-05 DIAGNOSIS — M25561 Pain in right knee: Secondary | ICD-10-CM | POA: Diagnosis not present

## 2019-09-06 NOTE — Telephone Encounter (Signed)
Attempted to contact patient twice today.  Phone does not ring, but eventually get a VM message.  VM box is full and unable to leave a message.

## 2019-09-12 ENCOUNTER — Emergency Department (HOSPITAL_COMMUNITY)
Admission: EM | Admit: 2019-09-12 | Discharge: 2019-09-15 | Disposition: A | Discharge status: Skilled Nursing Facility | Payer: Medicare HMO | Attending: Emergency Medicine | Admitting: Emergency Medicine

## 2019-09-12 ENCOUNTER — Other Ambulatory Visit: Payer: Self-pay

## 2019-09-12 ENCOUNTER — Emergency Department (HOSPITAL_COMMUNITY): Payer: Medicare HMO

## 2019-09-12 ENCOUNTER — Encounter (HOSPITAL_COMMUNITY): Payer: Self-pay | Admitting: Emergency Medicine

## 2019-09-12 DIAGNOSIS — R52 Pain, unspecified: Secondary | ICD-10-CM | POA: Diagnosis not present

## 2019-09-12 DIAGNOSIS — R296 Repeated falls: Secondary | ICD-10-CM | POA: Diagnosis not present

## 2019-09-12 DIAGNOSIS — W1839XA Other fall on same level, initial encounter: Secondary | ICD-10-CM | POA: Diagnosis not present

## 2019-09-12 DIAGNOSIS — Z79899 Other long term (current) drug therapy: Secondary | ICD-10-CM | POA: Diagnosis not present

## 2019-09-12 DIAGNOSIS — Z8616 Personal history of COVID-19: Secondary | ICD-10-CM | POA: Insufficient documentation

## 2019-09-12 DIAGNOSIS — Y9389 Activity, other specified: Secondary | ICD-10-CM | POA: Insufficient documentation

## 2019-09-12 DIAGNOSIS — J45909 Unspecified asthma, uncomplicated: Secondary | ICD-10-CM | POA: Insufficient documentation

## 2019-09-12 DIAGNOSIS — Z20822 Contact with and (suspected) exposure to covid-19: Secondary | ICD-10-CM | POA: Insufficient documentation

## 2019-09-12 DIAGNOSIS — I10 Essential (primary) hypertension: Secondary | ICD-10-CM | POA: Insufficient documentation

## 2019-09-12 DIAGNOSIS — R531 Weakness: Secondary | ICD-10-CM | POA: Diagnosis not present

## 2019-09-12 DIAGNOSIS — G4489 Other headache syndrome: Secondary | ICD-10-CM | POA: Diagnosis not present

## 2019-09-12 DIAGNOSIS — Y929 Unspecified place or not applicable: Secondary | ICD-10-CM | POA: Insufficient documentation

## 2019-09-12 DIAGNOSIS — M25551 Pain in right hip: Secondary | ICD-10-CM | POA: Insufficient documentation

## 2019-09-12 DIAGNOSIS — I272 Pulmonary hypertension, unspecified: Secondary | ICD-10-CM | POA: Diagnosis not present

## 2019-09-12 DIAGNOSIS — I7300 Raynaud's syndrome without gangrene: Secondary | ICD-10-CM | POA: Diagnosis not present

## 2019-09-12 DIAGNOSIS — W19XXXA Unspecified fall, initial encounter: Secondary | ICD-10-CM | POA: Diagnosis not present

## 2019-09-12 DIAGNOSIS — Y999 Unspecified external cause status: Secondary | ICD-10-CM | POA: Insufficient documentation

## 2019-09-12 DIAGNOSIS — M797 Fibromyalgia: Secondary | ICD-10-CM | POA: Diagnosis not present

## 2019-09-12 DIAGNOSIS — S79911A Unspecified injury of right hip, initial encounter: Secondary | ICD-10-CM | POA: Diagnosis not present

## 2019-09-12 LAB — CBC
HCT: 36.9 % (ref 36.0–46.0)
Hemoglobin: 11.3 g/dL — ABNORMAL LOW (ref 12.0–15.0)
MCH: 27.4 pg (ref 26.0–34.0)
MCHC: 30.6 g/dL (ref 30.0–36.0)
MCV: 89.3 fL (ref 80.0–100.0)
Platelets: 337 10*3/uL (ref 150–400)
RBC: 4.13 MIL/uL (ref 3.87–5.11)
RDW: 13 % (ref 11.5–15.5)
WBC: 7.7 10*3/uL (ref 4.0–10.5)
nRBC: 0 % (ref 0.0–0.2)

## 2019-09-12 LAB — URINALYSIS, ROUTINE W REFLEX MICROSCOPIC
Bilirubin Urine: NEGATIVE
Glucose, UA: NEGATIVE mg/dL
Hgb urine dipstick: NEGATIVE
Ketones, ur: NEGATIVE mg/dL
Leukocytes,Ua: NEGATIVE
Nitrite: NEGATIVE
Protein, ur: NEGATIVE mg/dL
Specific Gravity, Urine: 1.015 (ref 1.005–1.030)
pH: 5 (ref 5.0–8.0)

## 2019-09-12 LAB — COMPREHENSIVE METABOLIC PANEL WITH GFR
ALT: 16 U/L (ref 0–44)
AST: 17 U/L (ref 15–41)
Albumin: 4.2 g/dL (ref 3.5–5.0)
Alkaline Phosphatase: 71 U/L (ref 38–126)
Anion gap: 13 (ref 5–15)
BUN: 15 mg/dL (ref 8–23)
CO2: 27 mmol/L (ref 22–32)
Calcium: 9.6 mg/dL (ref 8.9–10.3)
Chloride: 102 mmol/L (ref 98–111)
Creatinine, Ser: 1.7 mg/dL — ABNORMAL HIGH (ref 0.44–1.00)
GFR calc Af Amer: 36 mL/min — ABNORMAL LOW
GFR calc non Af Amer: 31 mL/min — ABNORMAL LOW
Glucose, Bld: 104 mg/dL — ABNORMAL HIGH (ref 70–99)
Potassium: 3.6 mmol/L (ref 3.5–5.1)
Sodium: 142 mmol/L (ref 135–145)
Total Bilirubin: 0.6 mg/dL (ref 0.3–1.2)
Total Protein: 7.6 g/dL (ref 6.5–8.1)

## 2019-09-12 MED ORDER — ASPIRIN EC 81 MG PO TBEC
81.0000 mg | DELAYED_RELEASE_TABLET | Freq: Every day | ORAL | Status: DC
  Administered 2019-09-12 – 2019-09-14 (×3): 81 mg via ORAL
  Filled 2019-09-12 (×4): qty 1

## 2019-09-12 MED ORDER — ACETAMINOPHEN 500 MG PO TABS
500.0000 mg | ORAL_TABLET | Freq: Four times a day (QID) | ORAL | Status: DC | PRN
  Filled 2019-09-12: qty 1

## 2019-09-12 MED ORDER — ACETAMINOPHEN 500 MG PO TABS
1000.0000 mg | ORAL_TABLET | Freq: Once | ORAL | Status: AC
  Administered 2019-09-12: 1000 mg via ORAL
  Filled 2019-09-12: qty 2

## 2019-09-12 MED ORDER — VITAMIN D3 25 MCG (1000 UNIT) PO TABS
2000.0000 [IU] | ORAL_TABLET | Freq: Every day | ORAL | Status: DC
  Administered 2019-09-13 – 2019-09-14 (×2): 2000 [IU] via ORAL
  Filled 2019-09-12 (×3): qty 2

## 2019-09-12 MED ORDER — OXYCODONE HCL 5 MG PO TABS
15.0000 mg | ORAL_TABLET | Freq: Four times a day (QID) | ORAL | Status: DC | PRN
  Administered 2019-09-13: 15 mg via ORAL
  Filled 2019-09-12: qty 3

## 2019-09-12 MED ORDER — GABAPENTIN 400 MG PO CAPS
1200.0000 mg | ORAL_CAPSULE | Freq: Two times a day (BID) | ORAL | Status: DC
  Administered 2019-09-12 – 2019-09-14 (×5): 1200 mg via ORAL
  Filled 2019-09-12 (×6): qty 3

## 2019-09-12 MED ORDER — LORATADINE 10 MG PO TABS
10.0000 mg | ORAL_TABLET | Freq: Every day | ORAL | Status: DC
  Administered 2019-09-12 – 2019-09-14 (×3): 10 mg via ORAL
  Filled 2019-09-12 (×4): qty 1

## 2019-09-12 MED ORDER — FUROSEMIDE 40 MG PO TABS
40.0000 mg | ORAL_TABLET | Freq: Every day | ORAL | Status: DC
  Administered 2019-09-12 – 2019-09-14 (×3): 40 mg via ORAL
  Filled 2019-09-12 (×4): qty 1

## 2019-09-12 MED ORDER — ZINC SULFATE 220 (50 ZN) MG PO CAPS
220.0000 mg | ORAL_CAPSULE | Freq: Every day | ORAL | Status: DC
  Administered 2019-09-12 – 2019-09-14 (×3): 220 mg via ORAL
  Filled 2019-09-12 (×4): qty 1

## 2019-09-12 MED ORDER — CITALOPRAM HYDROBROMIDE 10 MG PO TABS
20.0000 mg | ORAL_TABLET | Freq: Every day | ORAL | Status: DC
  Administered 2019-09-12 – 2019-09-14 (×3): 20 mg via ORAL
  Filled 2019-09-12 (×4): qty 2

## 2019-09-12 MED ORDER — GABAPENTIN 400 MG PO CAPS: 400.0000 mg | ORAL_CAPSULE | Freq: Two times a day (BID) | ORAL | Status: DC

## 2019-09-12 MED ORDER — METOPROLOL TARTRATE 25 MG PO TABS
50.0000 mg | ORAL_TABLET | Freq: Two times a day (BID) | ORAL | Status: DC
  Administered 2019-09-12 – 2019-09-14 (×4): 50 mg via ORAL
  Filled 2019-09-12 (×6): qty 2

## 2019-09-12 MED ORDER — FLUTICASONE PROPIONATE 50 MCG/ACT NA SUSP
2.0000 | Freq: Every day | NASAL | Status: DC | PRN
  Filled 2019-09-12: qty 16

## 2019-09-12 MED ORDER — ASCORBIC ACID 500 MG PO TABS
500.0000 mg | ORAL_TABLET | Freq: Two times a day (BID) | ORAL | Status: DC
  Administered 2019-09-12 – 2019-09-14 (×5): 500 mg via ORAL
  Filled 2019-09-12 (×7): qty 1

## 2019-09-12 MED ORDER — FAMOTIDINE 20 MG PO TABS
20.0000 mg | ORAL_TABLET | Freq: Every day | ORAL | Status: DC
  Administered 2019-09-12 – 2019-09-14 (×3): 20 mg via ORAL
  Filled 2019-09-12 (×3): qty 1

## 2019-09-12 MED ORDER — CYANOCOBALAMIN 500 MCG PO TABS
500.0000 ug | ORAL_TABLET | Freq: Every day | ORAL | Status: DC
  Administered 2019-09-13 – 2019-09-14 (×2): 500 ug via ORAL
  Filled 2019-09-12 (×3): qty 1

## 2019-09-12 MED ORDER — AMITRIPTYLINE HCL 50 MG PO TABS
150.0000 mg | ORAL_TABLET | Freq: Every day | ORAL | Status: DC
  Administered 2019-09-12 – 2019-09-14 (×3): 150 mg via ORAL
  Filled 2019-09-12 (×3): qty 6

## 2019-09-12 NOTE — TOC Initial Note (Signed)
Transition of Care [facility]) - Initial/Assessment Note    Patient Details  Name: Melinda [name] MRN: [id number] Date of Birth: 03/04/1951  Transition of Care [facility]) CM/SW Contact:    Elliot Cousin, RN Phone Number:  [phone] 09/12/2019, 3:58 PM  Clinical Narrative:                 TOC CM spoke to pt and states she would like SNF rebah. She has been in the past to [place] and prefers not to go back. Gave permission to create FL2 and fax referral out to SNF rehab. ED provider updated. Will need PT evaluation.   Expected Discharge Plan: Skilled Nursing Facility Barriers to Discharge: Continued Medical Work up   Patient Goals and CMS Choice Patient states their goals for this hospitalization and ongoing recovery are:: would like to go to rehab, i am very unsteady on my feet CMS Medicare.gov Compare Post Acute Care list provided to:: Patient Choice offered to / list presented to : Patient  Expected Discharge Plan and Services Expected Discharge Plan: Skilled Nursing Facility In-house Referral: Clinical Social Work Discharge Planning Services: CM Consult Post Acute Care Choice: Skilled Nursing Facility Living arrangements for the past 2 months: Apartment                                      Prior Living Arrangements/Services Living arrangements for the past 2 months: Apartment Lives with:: Self Patient language and need for interpreter reviewed:: Yes Do you feel safe going back to the place where you live?: Yes      Need for Family Participation in Patient Care: Yes (Comment) Care giver support system in place?: No (comment) Current home services: DME(canes, rolling walker, bedside commode) Criminal Activity/Legal Involvement Pertinent to Current Situation/Hospitalization: No - Comment as needed  Activities of Daily Living      Permission Sought/Granted Permission sought to share information with : Case Manager, PCP, Family Supports, Optician, dispensing Permission granted to share information with : Yes, Verbal Permission Granted  Share Information with NAME: Acquanetta Belling  Permission granted to share info w AGENCY: SNF  Permission granted to share info w Relationship: son  Permission granted to share info w Contact Information: [phone]  Emotional Assessment       Orientation: : Oriented to Self, Oriented to Place, Oriented to Situation, Oriented to  Time   Psych Involvement: No (comment)  Admission diagnosis:  fall Patient Active Problem List   Diagnosis Date Noted  . Acute kidney injury (HCC) 05/10/2019  . Near syncope 05/09/2019  . Fall at home, initial encounter 05/09/2019  . History of 2019 novel coronavirus disease (COVID-19) 05/09/2019  . Anemia of chronic disease 05/09/2019  . Acute on chronic renal insufficiency 05/09/2019  . COVID-19 virus infection 10/12/2018  . CAP (community acquired pneumonia) 10/06/2018  . Polyneuropathy in diseases classified elsewhere (HCC) 10/04/2018  . Prediabetes 09/12/2017  . High risk medication use 09/12/2017  . Frequent falls 09/12/2017  . Chronic pain syndrome 08/14/2017  . Gastroesophageal reflux disease 08/14/2017  . Cough variant asthma 03/14/2017  . Pulmonary hypertension (HCC) 03/12/2017  . Right-sided chest pain 11/11/2016  . Asthma 11/11/2016  . Atypical chest pain   . Costochondritis   . Chronic congestive heart failure (HCC)   . MDD (major depressive disorder), recurrent episode (HCC) 05/23/2015  . Allergic rhinitis 11/03/2014  . Paresthesia 09/08/2014  .  Peripheral neuropathy 07/14/2014  . Morbid obesity due to excess calories ([place]) 07/14/2014  . Pain in limb 07/13/2014  . Abnormality of gait 07/13/2014  . Fibromyalgia 06/13/2014  . Spinal stenosis 01/27/2013  . HTN (hypertension) 11/30/2012   PCP:  Sandrea Hughs, NP Pharmacy:   [id number] [place], [place] [address] [place] [address] Phone: [phone] Fax: [phone]  [facility] [place], [place] [address] Phone: [phone] Fax: [phone]     Social Determinants of Health (SDOH) Interventions    Readmission Risk Interventions No flowsheet data found.

## 2019-09-12 NOTE — ED Provider Notes (Addendum)
[facility]-EMERGENCY DEPT Provider Note   CSN: [id number] Arrival date & time: 09/12/19  1209     History Chief Complaint  Patient presents with  . Fall    Melinda [name] is a 69 y.o. female.  Patient presents post fall by EMS. States she was sitting on edge of bed, was going to stand, when legs just 'gave way', causing her to slide down side of bed to floor. C/o right hip pain, dull, mild-moderate, non radiating. States hx frequent falls for several months, including within past week. States bil legs weak, and occasionally just give way. Is supposed to use walker, but does not always have with her. Denies faintness or lightheadedness. No syncope. Denies head injury. No neck or back pain. No chest pain or sob. No abd pain or nvd. +relatively poor po intake, denies recent wt loss. No recent change in meds. No fever or chills.   The history is provided by the patient and the EMS personnel.       Past Medical History:  Diagnosis Date  . Acute kidney injury (HCC)   . Allergy   . Anxiety   . Arthritis    "99% of my body" (11/11/2016)  . Asthma   . Bursitis of both hips   . Cataract   . Cervical scoliosis   . Chronic back pain    "all over my back" (11/11/2016)  . Depressive disorder, not elsewhere classified   . Enthesopathy of hip region   . Environmental allergies    "I take Claritin qd; 365 days/year" (11/11/2016)  . Essential hypertension, benign   . Fibromyalgia   . Frequent falls   . GERD (gastroesophageal reflux disease)   . Headache    "at least 1/wk; may last for 2 days or so" (11/11/2016)  . History of blood transfusion 1985   w/hysterectomy  . Lumbar stenosis   . Migraine    "a few/month" (11/11/2016)  . Muscle weakness (generalized)   . Myalgia and myositis, unspecified   . Osteoporosis   . Other abnormal blood chemistry   . Other malaise and fatigue   . Raynaud's syndrome   . Sciatic nerve pain, left   . Sciatica   . Unspecified  vitamin D deficiency     Patient Active Problem List   Diagnosis Date Noted  . Acute kidney injury (HCC) 05/10/2019  . Near syncope 05/09/2019  . Fall at home, initial encounter 05/09/2019  . History of 2019 novel coronavirus disease (COVID-19) 05/09/2019  . Anemia of chronic disease 05/09/2019  . Acute on chronic renal insufficiency 05/09/2019  . COVID-19 virus infection 10/12/2018  . CAP (community acquired pneumonia) 10/06/2018  . Polyneuropathy in diseases classified elsewhere (HCC) 10/04/2018  . Prediabetes 09/12/2017  . High risk medication use 09/12/2017  . Frequent falls 09/12/2017  . Chronic pain syndrome 08/14/2017  . Gastroesophageal reflux disease 08/14/2017  . Cough variant asthma 03/14/2017  . Pulmonary hypertension (HCC) 03/12/2017  . Right-sided chest pain 11/11/2016  . Asthma 11/11/2016  . Atypical chest pain   . Costochondritis   . Chronic congestive heart failure (HCC)   . MDD (major depressive disorder), recurrent episode (HCC) 05/23/2015  . Allergic rhinitis 11/03/2014  . Paresthesia 09/08/2014  . Peripheral neuropathy 07/14/2014  . Morbid obesity due to excess calories (HCC) 07/14/2014  . Pain in limb 07/13/2014  . Abnormality of gait 07/13/2014  . Fibromyalgia 06/13/2014  . Spinal stenosis 01/27/2013  . HTN (hypertension) 11/30/2012    Past Surgical  History:  Procedure Laterality Date  . ABDOMINAL HYSTERECTOMY  1985  . [place]; 1979  . SHOULDER OPEN ROTATOR CUFF REPAIR Left      OB History   No obstetric history on file.     Family History  Problem Relation Age of Onset  . Dementia Mother   . Heart disease Mother   . Hypertension Mother   . Diabetes Mother   . Diabetes Sister   . Cancer Sister        lung  . Cancer Other        Nepher (Mothers side)  . Diabetes Maternal Grandmother   . Arthritis Maternal Grandmother   . Diabetes Cousin        Mother's side  . Arthritis Sister   . Arthritis Cousin        Mother's  side   . Colon cancer Neg Hx   . Esophageal cancer Neg Hx   . Liver cancer Neg Hx   . Pancreatic cancer Neg Hx   . Rectal cancer Neg Hx   . Stomach cancer Neg Hx     Social History   Tobacco Use  . Smoking status: Never Smoker  . Smokeless tobacco: Never Used  Substance Use Topics  . Alcohol use: No    Alcohol/week: 0.0 standard drinks  . Drug use: No    Home Medications Prior to Admission medications   Medication Sig Start Date End Date Taking? Authorizing Provider  acetaminophen (TYLENOL) 500 MG tablet Take 500 mg by mouth every 6 (six) hours as needed for moderate pain or fever.    [provider]  amitriptyline (ELAVIL) 150 MG tablet Take 150 mg by mouth at bedtime.    [provider]  aspirin EC 81 MG tablet Take 81 mg by mouth daily.    [provider]  Cholecalciferol (VITAMIN D3) 2000 units TABS Take 2,000 Units by mouth daily.     [provider]  citalopram (CELEXA) 20 MG tablet TAKE 1 TABLET BY MOUTH EVERY DAY 05/18/19   [name], [name] C, NP  famotidine (PEPCID) 20 MG tablet Take 1 tablet (20 mg total) by mouth at bedtime. 09/03/18   [name], [name] C, NP  fluticasone (FLONASE) 50 MCG/ACT nasal spray Place 2 sprays into both nostrils daily as needed for allergies or rhinitis. 02/22/19   [name], [name] C, NP  furosemide (LASIX) 40 MG tablet TAKE 1 TABLET BY MOUTH EVERY DAY 08/12/19   [name], [name] C, NP  gabapentin (NEURONTIN) 400 MG capsule TAKE 3 CAPSULES TWICE DAILY  AND TAKE 2 CAPSULES AT BEDTIME  FOR  NEUROPATHY 02/10/19   [name], [name] C, NP  loratadine (CLARITIN) 10 MG tablet Take 10 mg by mouth daily.    [provider]  metoprolol tartrate (LOPRESSOR) 50 MG tablet Take one tablet by mouth twice daily for blood pressure 02/11/19   [name], [name] C, NP  oxyCODONE (ROXICODONE) 15 MG immediate release tablet Take 1 tablet (15 mg total) by mouth every 6 (six) hours as needed for pain. 08/26/19   Lauree Chandler, NP    SUMAtriptan (IMITREX) 25 MG tablet TAKE 1 TAB EVERY 2 HRS AS NEEDED FOR MIGRAINE OR HEADACHE. MAY REPEAT IN 2 HRS IF PERSISTS/RECURS. 04/25/19   [name], [name] C, NP  vitamin B-12 (CYANOCOBALAMIN) 500 MCG tablet Take 500 mcg by mouth daily.    [provider]  vitamin C (VITAMIN C) 500 MG tablet Take 1 tablet (500 mg total) by mouth 2 (  two) times daily. 10/12/18   Elgergawy, Leana Roe, MD  zinc sulfate 220 (50 Zn) MG capsule TAKE 1 CAPSULE (220 MG TOTAL) BY MOUTH DAILY. 08/29/19   [name], [name], NP    Allergies    Patient has no known allergies.  Review of Systems   Review of Systems  Constitutional: Negative for chills and fever.  HENT: Negative for sore throat.   Eyes: Negative for redness.  Respiratory: Negative for cough and shortness of breath.   Cardiovascular: Negative for chest pain.  Gastrointestinal: Negative for abdominal pain, blood in stool, diarrhea and vomiting.  Endocrine: Negative for polyuria.  Genitourinary: Negative for dysuria and flank pain.  Musculoskeletal: Negative for back pain and neck pain.  Skin: Negative for rash.  Neurological: Negative for numbness and headaches.  Hematological: Does not bruise/bleed easily.  Psychiatric/Behavioral: Negative for confusion.    Physical Exam Updated Vital Signs BP (!) 141/78   Pulse 70   Temp 97.7 F (36.5 C) (Oral)   Resp 18   SpO2 100%   Physical Exam Vitals and nursing note reviewed.  Constitutional:      Appearance: Normal appearance. She is well-developed.  HENT:     Head: Atraumatic.     Nose: Nose normal.     Mouth/Throat:     Mouth: Mucous membranes are moist.  Eyes:     General: No scleral icterus.    Conjunctiva/sclera: Conjunctivae normal.     Pupils: Pupils are equal, round, and reactive to light.  Neck:     Vascular: No carotid bruit.     Trachea: No tracheal deviation.     Comments: Thyroid not grossly enlarged or tender.  Cardiovascular:     Rate and Rhythm: Normal rate and  regular rhythm.     Pulses: Normal pulses.     Heart sounds: Normal heart sounds. No murmur. No friction rub. No gallop.   Pulmonary:     Effort: Pulmonary effort is normal. No respiratory distress.     Breath sounds: Normal breath sounds.  Chest:     Chest wall: No tenderness.  Abdominal:     General: Bowel sounds are normal. There is no distension.     Palpations: Abdomen is soft.     Tenderness: There is no abdominal tenderness. There is no guarding.  Genitourinary:    Comments: No cva tenderness.  Musculoskeletal:        General: No swelling or tenderness.     Cervical back: Normal range of motion and neck supple. No rigidity. No muscular tenderness.     Comments: CTLS spine, non tender, aligned, no step off. Good rom bil extremities without pain or focal bony tenderness.   Skin:    General: Skin is warm and dry.     Findings: No rash.  Neurological:     Mental Status: She is alert.     Comments: Alert, speech normal. GCS 15. Motor/sens grossly intact bil.   Psychiatric:        Mood and Affect: Mood normal.     ED Results / Procedures / Treatments   Labs (all labs ordered are listed, but only abnormal results are displayed) Results for orders placed or performed during the hospital encounter of 09/12/19  CBC  Result Value Ref Range   WBC 7.7 4.0 - 10.5 K/uL   RBC 4.13 3.87 - 5.11 MIL/uL   Hemoglobin 11.3 (L) 12.0 - 15.0 g/dL   HCT 16.1 09.6 - 04.5 %   MCV 89.3 80.0 -  100.0 fL   MCH 27.4 26.0 - 34.0 pg   MCHC 30.6 30.0 - 36.0 g/dL   RDW 97.9 89.2 - 11.9 %   Platelets 337 150 - 400 K/uL   nRBC 0.0 0.0 - 0.2 %  Comprehensive metabolic panel  Result Value Ref Range   Sodium 142 135 - 145 mmol/L   Potassium 3.6 3.5 - 5.1 mmol/L   Chloride 102 98 - 111 mmol/L   CO2 27 22 - 32 mmol/L   Glucose, Bld 104 (H) 70 - 99 mg/dL   BUN 15 8 - 23 mg/dL   Creatinine, Ser 4.17 (H) 0.44 - 1.00 mg/dL   Calcium 9.6 8.9 - 40.8 mg/dL   Total Protein 7.6 6.5 - 8.1 g/dL   Albumin  4.2 3.5 - 5.0 g/dL   AST 17 15 - 41 U/L   ALT 16 0 - 44 U/L   Alkaline Phosphatase 71 38 - 126 U/L   Total Bilirubin 0.6 0.3 - 1.2 mg/dL   GFR calc non Af Amer 31 (L) >60 mL/min   GFR calc Af Amer 36 (L) >60 mL/min   Anion gap 13 5 - 15  Urinalysis, Routine w reflex microscopic  Result Value Ref Range   Color, Urine YELLOW YELLOW   APPearance CLEAR CLEAR   Specific Gravity, Urine 1.015 1.005 - 1.030   pH 5.0 5.0 - 8.0   Glucose, UA NEGATIVE NEGATIVE mg/dL   Hgb urine dipstick NEGATIVE NEGATIVE   Bilirubin Urine NEGATIVE NEGATIVE   Ketones, ur NEGATIVE NEGATIVE mg/dL   Protein, ur NEGATIVE NEGATIVE mg/dL   Nitrite NEGATIVE NEGATIVE   Leukocytes,Ua NEGATIVE NEGATIVE   DG HIP UNILAT W OR W/O PELVIS 2-3 VIEWS RIGHT  Result Date: 09/12/2019 CLINICAL DATA:  Status post fall, pain EXAM: DG HIP (WITH OR WITHOUT PELVIS) 2-3V RIGHT COMPARISON:  None. FINDINGS: There is no evidence of hip fracture or dislocation. There is no evidence of arthropathy or other focal bone abnormality. Subchondral sclerosis around the pubic symphysis as can be seen with osteitis pubis. IMPRESSION: No acute osseous injury of the right hip. Electronically Signed   By: Elige Ko   On: 09/12/2019 13:12    EKG None  Radiology DG HIP UNILAT W OR W/O PELVIS 2-3 VIEWS RIGHT  Result Date: 09/12/2019 CLINICAL DATA:  Status post fall, pain EXAM: DG HIP (WITH OR WITHOUT PELVIS) 2-3V RIGHT COMPARISON:  None. FINDINGS: There is no evidence of hip fracture or dislocation. There is no evidence of arthropathy or other focal bone abnormality. Subchondral sclerosis around the pubic symphysis as can be seen with osteitis pubis. IMPRESSION: No acute osseous injury of the right hip. Electronically Signed   By: Elige Ko   On: 09/12/2019 13:12    Procedures Procedures (including critical care time)  Medications Ordered in ED Medications  acetaminophen (TYLENOL) tablet 1,000 mg (1,000 mg Oral Given 09/12/19 1308)    ED  Course  I have reviewed the triage vital signs and the nursing notes.  Pertinent labs & imaging results that were available during my care of the patient were reviewed by me and considered in my medical decision making (see chart for details).    MDM Rules/Calculators/A&P                      Iv ns. Stat labs. Imaging.   Reviewed nursing notes and prior charts for additional history.   Labs reviewed/interpreted by me - wbc normal. Chem normal, cr is sl  increased from prior (baseline in 1.55 range, today 1.7).  Po fluids, food.   Patient indicates some issues caring for self at home, hx falls. Will get [facility] consult ?home health services.   Xrays reviewed/interpreted by me - no fx.   Acetaminophen po. Po fluids.   Recheck pt comfortable appearing, no distress.   Additional labs reviewed/interpreted by me - ua neg for infection.  TOC team indicates pt does not feel able to return home with home health services - they request PT eval, and are working on getting to a facility.  Signed out to Dr Freida Busman to f/u with [facility] team who is working on placement.    Final Clinical Impression(s) / ED Diagnoses Final diagnoses:  None    Rx / DC Orders ED Discharge Orders    None           Cathren Laine, MD 09/12/19 1720

## 2019-09-12 NOTE — ED Triage Notes (Signed)
Arrives via EMS from home, C/C fall this AM in attempt to transfer from bed to her walker. Denies hitting her head, denies LOC. Patient reports back pain prior to fall, this is her baseline.   Vitals with EMS 18 RR 98% RA CBG 109 97.8 T

## 2019-09-12 NOTE — ED Notes (Signed)
Pt aware urine sample is needed. Unable to go at this time.  

## 2019-09-13 LAB — RESPIRATORY PANEL BY RT PCR (FLU A&B, COVID)
Influenza A by PCR: NEGATIVE
Influenza B by PCR: NEGATIVE
SARS Coronavirus 2 by RT PCR: NEGATIVE

## 2019-09-13 NOTE — Progress Notes (Addendum)
2:03p Patient initally agreed to go to [facility]. CSW was unable to get in contact with admissions to confirm bed offer. CSW reached out to [facility] who was agreeable to accept patient. CSW to being insurance auth.  ADDENDUM: patient is not managed by [facility], facility to start authorization.  11:52a CSW spoke with patient about current bed offers. Patient was agreeable to [facility]. CSW contacted admissions person to start the process. Will continue to follow up.   Geralyn Corwin, LCSW Transitions of Care Department [facility] ED [phone]

## 2019-09-13 NOTE — ED Provider Notes (Addendum)
Covid test ordered for placement.  Social work states 2-hour test may help get her placed more quickly.  Patient reevaluated and pending placement.   Benjiman Core, MD 09/13/19 Melinda [name]    Benjiman Core, MD 09/13/19 [phone]

## 2019-09-13 NOTE — Telephone Encounter (Signed)
Tried calling patient again today. VM box is full and unable to leave message. We have been trying to contact her for two weeks without success.    Called Camelia Eng, patient's daughter in [place].  She stated patient was in [facility] to a [facility].   Upon checking in Epic patient was in ED and had not been admitted.  SNF placement is being pursued.

## 2019-09-13 NOTE — Evaluation (Signed)
Physical Therapy Evaluation Patient Details Name: Melinda [name] MRN: [id number] DOB: 1951/05/22 Today's Date: 09/13/2019   History of Present Illness  presents post fall by EMS. States she was sitting on edge of bed, was going to stand, when legs just 'gave way', causing her to slide down side of bed to floor. C/o right hip pain, dull, mild-moderate, non radiating. States hx frequent falls for several months, including within past week. States bil legs weak, and occasionally just give way. H/O myalgias, L RCT, depression.  Clinical Impression  The patient is very lethargic and barely aroused without much stimulation. Patient did sit on edgeof stretcher, dozing and leaning posteriorly. Patient not deemed aroused enough to stand as well as height of the stretcher is too high for safe transition with H/O falls. Patient agreeable to a rehab stay to imprve safe mobilization. Pt admitted with above diagnosis.  Pt currently with functional limitations due to the deficits listed below (see PT Problem List). Pt will benefit from skilled PT to increase their independence and safety with mobility to allow discharge to the venue listed below.       Follow Up Recommendations SNF    Equipment Recommendations  (scooter)    Recommendations for Other Services OT consult     Precautions / Restrictions Precautions Precautions: Fall      Mobility  Bed Mobility Overal bed mobility: Needs Assistance Bed Mobility: Supine to Sit;Sit to Supine     Supine to sit: Min assist Sit to supine: Mod assist   General bed mobility comments: extra stimulation to arouse enough to mobilize to edfge and assistance for back into the bed./stretcher  Transfers                 General transfer comment: did not attempt due to patient lethargy and fall risk and height of stretcher.  Ambulation/Gait                Stairs            Wheelchair Mobility    Modified Rankin (Stroke Patients Only)       Balance Overall balance assessment: History of Falls;Needs assistance Sitting-balance support: Feet unsupported;Bilateral upper extremity supported Sitting balance-Leahy Scale: Poor Sitting balance - Comments: posterior leaning, frequent cue to reposition                                     Pertinent Vitals/Pain Pain Assessment: Faces Faces Pain Scale: Hurts even more Pain Location: left shoulde and right knee    Home Living Family/patient expects to be discharged to:: Private residence Living Arrangements: Alone Available Help at Discharge: Available PRN/intermittently Type of Home: Apartment Home Access: Elevator     Home Layout: One level Home Equipment: Environmental consultant - 4 wheels;Bedside commode      Prior Function           Comments: Uses rollator for short distances, furniture walking and scooter. Reports multiple falls.     Hand Dominance        Extremity/Trunk Assessment   Upper Extremity Assessment Upper Extremity Assessment: Generalized weakness    Lower Extremity Assessment Lower Extremity Assessment: Generalized weakness    Cervical / Trunk Assessment Cervical / Trunk Assessment: Normal  Communication   Communication: (letharggic and speach not clear)  Cognition Arousal/Alertness: Lethargic Behavior During Therapy: Flat affect Overall Cognitive Status: History of cognitive impairments - at baseline Area of Impairment: Orientation;Attention;Following commands  Orientation Level: Place;Time Current Attention Level: Focused   Following Commands: Follows one step commands inconsistently       General Comments: pt. is too lethargic to get meaningful history      General Comments      Exercises     Assessment/Plan    PT Assessment Patient needs continued PT services  PT Problem List Decreased strength;Decreased balance;Decreased cognition;Decreased knowledge of precautions;Decreased range of  motion;Decreased mobility;Decreased knowledge of use of DME;Decreased activity tolerance;Decreased safety awareness;Pain       PT Treatment Interventions DME instruction;Functional mobility training;Gait training;Patient/family education;Balance training;Therapeutic activities;Therapeutic exercise    PT Goals (Current goals can be found in the Care Plan section)  Acute Rehab PT Goals Patient Stated Goal: to not fall PT Goal Formulation: With patient Time For Goal Achievement: 09/27/19 Potential to Achieve Goals: Good    Frequency Min 2X/week   Barriers to discharge Decreased caregiver support      Co-evaluation               AM-PAC PT "6 Clicks" Mobility  Outcome Measure Help needed turning from your back to your side while in a flat bed without using bedrails?: A Lot Help needed moving from lying on your back to sitting on the side of a flat bed without using bedrails?: A Lot Help needed moving to and from a bed to a chair (including a wheelchair)?: Total Help needed standing up from a chair using your arms (e.g., wheelchair or bedside chair)?: Total Help needed to walk in hospital room?: Total Help needed climbing 3-5 steps with a railing? : Total 6 Click Score: 8    End of Session   Activity Tolerance: Patient limited by lethargy Patient left: in bed;with call bell/phone within reach Nurse Communication: Mobility status PT Visit Diagnosis: Unsteadiness on feet (R26.81);Difficulty in walking, not elsewhere classified (R26.2);Repeated falls (R29.6)    Time: 2355-7322 PT Time Calculation (min) (ACUTE ONLY): 21 min   Charges:   PT Evaluation $PT Eval Low Complexity: [place] [facility] Acute Rehabilitation Services Pager [phone] Office [phone]   Claretha Cooper 09/13/2019, 10:14 AM

## 2019-09-13 NOTE — NC FL2 (Signed)
MEDICAID FL2 LEVEL OF CARE SCREENING TOOL     IDENTIFICATION  Patient Name: Melinda [name] Birthdate: [birth year] Sex: female Admission Date (Current Location): 09/12/2019  [facility] and [place] Number:  Producer, television/film/video and Address:  [facility],  [address] [place] [address]      Provider Number: [id number]  Attending Physician Name and Address:  Default, Provider, MD  Relative Name and Phone Number:       Current Level of Care: Hospital Recommended Level of Care: Skilled Nursing Facility Prior Approval Number:    Date Approved/Denied:   PASRR Number: [id number]  Discharge Plan: SNF    Current Diagnoses: Patient Active Problem List   Diagnosis Date Noted  . Acute kidney injury (HCC) 05/10/2019  . Near syncope 05/09/2019  . Fall at home, initial encounter 05/09/2019  . History of 2019 novel coronavirus disease (COVID-19) 05/09/2019  . Anemia of chronic disease 05/09/2019  . Acute on chronic renal insufficiency 05/09/2019  . COVID-19 virus infection 10/12/2018  . CAP (community acquired pneumonia) 10/06/2018  . Polyneuropathy in diseases classified elsewhere (HCC) 10/04/2018  . Prediabetes 09/12/2017  . High risk medication use 09/12/2017  . Frequent falls 09/12/2017  . Chronic pain syndrome 08/14/2017  . Gastroesophageal reflux disease 08/14/2017  . Cough variant asthma 03/14/2017  . Pulmonary hypertension (HCC) 03/12/2017  . Right-sided chest pain 11/11/2016  . Asthma 11/11/2016  . Atypical chest pain   . Costochondritis   . Chronic congestive heart failure (HCC)   . MDD (major depressive disorder), recurrent episode (HCC) 05/23/2015  . Allergic rhinitis 11/03/2014  . Paresthesia 09/08/2014  . Peripheral neuropathy 07/14/2014  . Morbid obesity due to excess calories (HCC) 07/14/2014  . Pain in limb 07/13/2014  . Abnormality of gait 07/13/2014  . Fibromyalgia 06/13/2014  . Spinal stenosis 01/27/2013  . HTN (hypertension)  11/30/2012    Orientation RESPIRATION BLADDER Height & Weight     Self, Time, Situation, Place  Normal Continent Weight: 225 lb (102.1 kg) Height:  5\' 4"  (162.6 cm)  BEHAVIORAL SYMPTOMS/MOOD NEUROLOGICAL BOWEL NUTRITION STATUS      Continent Diet(Regular)  AMBULATORY STATUS COMMUNICATION OF NEEDS Skin   Limited Assist Verbally Normal                       Personal Care Assistance Level of Assistance  Bathing, Feeding, Dressing Bathing Assistance: Limited assistance Feeding assistance: Independent Dressing Assistance: Limited assistance     Functional Limitations Info  Sight, Hearing, Speech Sight Info: Adequate Hearing Info: Adequate Speech Info: Adequate    SPECIAL CARE FACTORS FREQUENCY  PT (By licensed PT), OT (By licensed OT)     PT Frequency: 5x weekly OT Frequency: 5x weekly            Contractures Contractures Info: Not present    Additional Factors Info  Code Status, Allergies Code Status Info: Full Allergies Info: NKA           Current Medications (09/13/2019):  This is the current hospital active medication list Current Facility-Administered Medications  Medication Dose Route Frequency Provider Last Rate Last Admin  . acetaminophen (TYLENOL) tablet 500 mg  500 mg Oral Q6H PRN 09/15/2019, MD      . amitriptyline (ELAVIL) tablet 150 mg  150 mg Oral QHS Lorre Nick, MD   150 mg at 09/12/19 2132  . ascorbic acid (VITAMIN C) tablet 500 mg  500 mg Oral BID 2133, MD   500 mg  at 09/13/19 1045  . aspirin EC tablet 81 mg  81 mg Oral Daily Lorre Nick, MD   81 mg at 09/13/19 1045  . cholecalciferol (VITAMIN D) tablet 2,000 Units  2,000 Units Oral Daily Lorre Nick, MD   2,000 Units at 09/13/19 1047  . citalopram (CELEXA) tablet 20 mg  20 mg Oral Daily Lorre Nick, MD   20 mg at 09/13/19 1046  . famotidine (PEPCID) tablet 20 mg  20 mg Oral QHS Lorre Nick, MD   20 mg at 09/12/19 2133  . fluticasone (FLONASE) 50 MCG/ACT nasal  spray 2 spray  2 spray Each Nare Daily PRN Lorre Nick, MD      . furosemide (LASIX) tablet 40 mg  40 mg Oral Daily Lorre Nick, MD   40 mg at 09/13/19 1046  . gabapentin (NEURONTIN) capsule 1,200 mg  1,200 mg Oral BID Lorre Nick, MD   1,200 mg at 09/13/19 1045  . loratadine (CLARITIN) tablet 10 mg  10 mg Oral Daily Lorre Nick, MD   10 mg at 09/13/19 1046  . metoprolol tartrate (LOPRESSOR) tablet 50 mg  50 mg Oral BID Lorre Nick, MD   50 mg at 09/13/19 1045  . oxyCODONE (Oxy IR/ROXICODONE) immediate release tablet 15 mg  15 mg Oral Q6H PRN Lorre Nick, MD   15 mg at 09/13/19 0748  . vitamin B-12 (CYANOCOBALAMIN) tablet 500 mcg  500 mcg Oral Daily Lorre Nick, MD   500 mcg at 09/13/19 1046  . zinc sulfate capsule 220 mg  220 mg Oral Daily Lorre Nick, MD   220 mg at 09/13/19 1046   Current Outpatient Medications  Medication Sig Dispense Refill  . acetaminophen (TYLENOL) 500 MG tablet Take 500 mg by mouth every 6 (six) hours as needed for moderate pain or fever.    Marland Kitchen amitriptyline (ELAVIL) 100 MG tablet Take 100 mg by mouth at bedtime.    Marland Kitchen amitriptyline (ELAVIL) 150 MG tablet Take 150 mg by mouth at bedtime.    Marland Kitchen aspirin EC 81 MG tablet Take 81 mg by mouth daily.    . Cholecalciferol (VITAMIN D3) 2000 units TABS Take 2,000 Units by mouth daily.     . citalopram (CELEXA) 20 MG tablet TAKE 1 TABLET BY MOUTH EVERY DAY (Patient taking differently: Take 20 mg by mouth daily. ) 90 tablet 1  . famotidine (PEPCID) 20 MG tablet Take 1 tablet (20 mg total) by mouth at bedtime. 90 tablet 3  . fluticasone (FLONASE) 50 MCG/ACT nasal spray Place 2 sprays into both nostrils daily as needed for allergies or rhinitis. 48 g 1  . furosemide (LASIX) 40 MG tablet TAKE 1 TABLET BY MOUTH EVERY DAY (Patient taking differently: Take 40 mg by mouth daily. ) 90 tablet 1  . gabapentin (NEURONTIN) 400 MG capsule TAKE 3 CAPSULES TWICE DAILY  AND TAKE 2 CAPSULES AT BEDTIME  FOR  NEUROPATHY (Patient  taking differently: Take 800-1,200 mg by mouth See admin instructions. TAKE 3 CAPSULES TWICE DAILY  AND TAKE 2 CAPSULES AT BEDTIME  FOR  NEUROPATHY) 720 capsule 1  . loratadine (CLARITIN) 10 MG tablet Take 10 mg by mouth daily.    . metoprolol tartrate (LOPRESSOR) 50 MG tablet Take one tablet by mouth twice daily for blood pressure 180 tablet 1  . oxyCODONE (ROXICODONE) 15 MG immediate release tablet Take 1 tablet (15 mg total) by mouth every 6 (six) hours as needed for pain. 120 tablet 0  . SUMAtriptan (IMITREX) 25 MG tablet TAKE  1 TAB EVERY 2 HRS AS NEEDED FOR MIGRAINE OR HEADACHE. MAY REPEAT IN 2 HRS IF PERSISTS/RECURS. (Patient taking differently: Take 25 mg by mouth every 2 (two) hours as needed for migraine or headache. ) 9 tablet 1  . vitamin B-12 (CYANOCOBALAMIN) 500 MCG tablet Take 500 mcg by mouth daily.    . vitamin C (VITAMIN C) 500 MG tablet Take 1 tablet (500 mg total) by mouth 2 (two) times daily. 20 tablet   . zinc sulfate 220 (50 Zn) MG capsule TAKE 1 CAPSULE (220 MG TOTAL) BY MOUTH DAILY. (Patient taking differently: Take 220 mg by mouth daily. ) 90 capsule 1     Discharge Medications: Please see discharge summary for a list of discharge medications.  Relevant Imaging Results:  Relevant Lab Results:   Additional Information SSN: [id number]  Janace Hoard, LCSW

## 2019-09-13 NOTE — ED Provider Notes (Signed)
Patient signed to me by Dr. Denton Lank pending consult by case management.  Patient has been seen by them and they are working on placement.  Home meds reordered   Lorre Nick, MD 09/13/19 [phone]

## 2019-09-14 NOTE — Progress Notes (Signed)
Awaiting [employer] insurance auth for SNF at [facility]. CSW has been in contact with [employer] and will continue to follow up.   Geralyn Corwin, LCSW Transitions of Care Department [facility] ED [phone]

## 2019-09-15 DIAGNOSIS — M4182 Other forms of scoliosis, cervical region: Secondary | ICD-10-CM | POA: Diagnosis not present

## 2019-09-15 DIAGNOSIS — M5432 Sciatica, left side: Secondary | ICD-10-CM | POA: Diagnosis not present

## 2019-09-15 DIAGNOSIS — I10 Essential (primary) hypertension: Secondary | ICD-10-CM | POA: Diagnosis not present

## 2019-09-15 DIAGNOSIS — M6281 Muscle weakness (generalized): Secondary | ICD-10-CM | POA: Diagnosis not present

## 2019-09-15 DIAGNOSIS — F332 Major depressive disorder, recurrent severe without psychotic features: Secondary | ICD-10-CM | POA: Diagnosis not present

## 2019-09-15 DIAGNOSIS — R41841 Cognitive communication deficit: Secondary | ICD-10-CM | POA: Diagnosis not present

## 2019-09-15 DIAGNOSIS — G894 Chronic pain syndrome: Secondary | ICD-10-CM | POA: Diagnosis not present

## 2019-09-15 DIAGNOSIS — G43009 Migraine without aura, not intractable, without status migrainosus: Secondary | ICD-10-CM | POA: Diagnosis not present

## 2019-09-15 DIAGNOSIS — F4321 Adjustment disorder with depressed mood: Secondary | ICD-10-CM | POA: Diagnosis not present

## 2019-09-15 DIAGNOSIS — R29898 Other symptoms and signs involving the musculoskeletal system: Secondary | ICD-10-CM | POA: Diagnosis not present

## 2019-09-15 DIAGNOSIS — I4581 Long QT syndrome: Secondary | ICD-10-CM | POA: Diagnosis not present

## 2019-09-15 DIAGNOSIS — R0902 Hypoxemia: Secondary | ICD-10-CM | POA: Diagnosis not present

## 2019-09-15 DIAGNOSIS — M48061 Spinal stenosis, lumbar region without neurogenic claudication: Secondary | ICD-10-CM | POA: Diagnosis not present

## 2019-09-15 DIAGNOSIS — J302 Other seasonal allergic rhinitis: Secondary | ICD-10-CM | POA: Diagnosis not present

## 2019-09-15 DIAGNOSIS — G63 Polyneuropathy in diseases classified elsewhere: Secondary | ICD-10-CM | POA: Diagnosis not present

## 2019-09-15 DIAGNOSIS — R5381 Other malaise: Secondary | ICD-10-CM | POA: Diagnosis not present

## 2019-09-15 DIAGNOSIS — W19XXXA Unspecified fall, initial encounter: Secondary | ICD-10-CM | POA: Diagnosis not present

## 2019-09-15 DIAGNOSIS — D649 Anemia, unspecified: Secondary | ICD-10-CM | POA: Diagnosis not present

## 2019-09-15 DIAGNOSIS — M48062 Spinal stenosis, lumbar region with neurogenic claudication: Secondary | ICD-10-CM | POA: Diagnosis not present

## 2019-09-15 DIAGNOSIS — G629 Polyneuropathy, unspecified: Secondary | ICD-10-CM | POA: Diagnosis not present

## 2019-09-15 DIAGNOSIS — R488 Other symbolic dysfunctions: Secondary | ICD-10-CM | POA: Diagnosis not present

## 2019-09-15 DIAGNOSIS — R45851 Suicidal ideations: Secondary | ICD-10-CM | POA: Diagnosis not present

## 2019-09-15 DIAGNOSIS — R0602 Shortness of breath: Secondary | ICD-10-CM | POA: Diagnosis not present

## 2019-09-15 DIAGNOSIS — F29 Unspecified psychosis not due to a substance or known physiological condition: Secondary | ICD-10-CM | POA: Diagnosis not present

## 2019-09-15 DIAGNOSIS — R0789 Other chest pain: Secondary | ICD-10-CM | POA: Diagnosis not present

## 2019-09-15 DIAGNOSIS — Z7401 Bed confinement status: Secondary | ICD-10-CM | POA: Diagnosis not present

## 2019-09-15 DIAGNOSIS — F330 Major depressive disorder, recurrent, mild: Secondary | ICD-10-CM | POA: Diagnosis not present

## 2019-09-15 DIAGNOSIS — R079 Chest pain, unspecified: Secondary | ICD-10-CM | POA: Diagnosis not present

## 2019-09-15 DIAGNOSIS — M2550 Pain in unspecified joint: Secondary | ICD-10-CM | POA: Diagnosis not present

## 2019-09-15 DIAGNOSIS — R2681 Unsteadiness on feet: Secondary | ICD-10-CM | POA: Diagnosis not present

## 2019-09-15 DIAGNOSIS — R918 Other nonspecific abnormal finding of lung field: Secondary | ICD-10-CM | POA: Diagnosis not present

## 2019-09-15 DIAGNOSIS — G43909 Migraine, unspecified, not intractable, without status migrainosus: Secondary | ICD-10-CM | POA: Diagnosis not present

## 2019-09-15 DIAGNOSIS — I959 Hypotension, unspecified: Secondary | ICD-10-CM | POA: Diagnosis not present

## 2019-09-15 DIAGNOSIS — R9431 Abnormal electrocardiogram [ECG] [EKG]: Secondary | ICD-10-CM | POA: Diagnosis not present

## 2019-09-15 NOTE — ED Provider Notes (Signed)
Pt is stable for discharge to [facility]   Bethann Berkshire, MD 09/15/19 1001

## 2019-09-15 NOTE — Progress Notes (Signed)
Patient has been accepted to [facility]. Patient will be going to room 110. Number for report is [phone]. Patient will be transported via [facility].   Geralyn Corwin, LCSW Transitions of Care Department [facility] ED [phone]

## 2019-09-15 NOTE — ED Provider Notes (Signed)
Emergency Medicine Observation Re-evaluation Note  Melinda [name] is a 69 y.o. female, seen on rounds today.  Pt initially presented to the ED for complaints of Fall Currently, the patient is awaiting placement  Physical Exam  BP 113/70 (BP Location: Right Arm)   Pulse 64   Temp (!) 97.5 F (36.4 C) (Oral)   Resp 18   Ht 5\' 4"  (1.626 m)   Wt 102.1 kg   SpO2 94%   BMI 38.62 kg/m  Physical Exam Alert in nad ED Course / MDM  EKG:    I have reviewed the labs performed to date as well as medications administered while in observation.   Plan  Current plan is for [facility] placement    , MD 09/15/19 [phone]

## 2019-09-15 NOTE — Discharge Instructions (Addendum)
Go to [facility] now

## 2019-09-16 ENCOUNTER — Encounter: Payer: Self-pay | Admitting: Internal Medicine

## 2019-09-16 ENCOUNTER — Other Ambulatory Visit: Payer: Self-pay | Admitting: *Deleted

## 2019-09-16 ENCOUNTER — Non-Acute Institutional Stay (SKILLED_NURSING_FACILITY): Payer: Medicare HMO | Admitting: Internal Medicine

## 2019-09-16 DIAGNOSIS — R29898 Other symptoms and signs involving the musculoskeletal system: Secondary | ICD-10-CM

## 2019-09-16 DIAGNOSIS — G63 Polyneuropathy in diseases classified elsewhere: Secondary | ICD-10-CM | POA: Diagnosis not present

## 2019-09-16 DIAGNOSIS — I10 Essential (primary) hypertension: Secondary | ICD-10-CM

## 2019-09-16 DIAGNOSIS — M48062 Spinal stenosis, lumbar region with neurogenic claudication: Secondary | ICD-10-CM | POA: Diagnosis not present

## 2019-09-16 DIAGNOSIS — F330 Major depressive disorder, recurrent, mild: Secondary | ICD-10-CM

## 2019-09-16 DIAGNOSIS — G43909 Migraine, unspecified, not intractable, without status migrainosus: Secondary | ICD-10-CM | POA: Diagnosis not present

## 2019-09-16 NOTE — Progress Notes (Signed)
: Provider:  Margit Hanks., MD Location:  [facility] Room Number: 110-P Place of Service:  SNF [phone])  PCP: Margit Hanks, MD Patient Care Team: Margit Hanks, MD as PCP - General (Internal Medicine)  Extended Emergency Contact Information Primary Emergency Contact: Jerrye Beavers [place] Mobile Phone: [phone] Relation: Daughter Secondary Emergency Contact: Dutch,Lashawn Mobile Phone: [phone] Relation: Daughter     Allergies: Patient has no known allergies.  Chief Complaint  Patient presents with  . New Admit To SNF    New admission to [facility] SNF    HPI: Patient is a 69 y.o. female arthritis hypertension fibromyalgia GERD lumbar stenosis and Raynaud's syndrome who presented to [facility] long emergency department after a fall.  She was sitting on the side of the bed, was going to stand when her legs gave way causing her to slide down the side of the bed to the floor she complained of right hip pain.  She admitted frequent falls in the past several months including the past week.  Complains of bilateral leg weakness and occasionally giving way.  She denies faintness lightheadedness syncope head injury neck pain back pain chest pain shortness of breath abdominal pain nausea vomiting.  She has had poor intake but no recent weight loss.  X-rays in the emergency department did not show any injury.  Patient is admitted to skilled nursing facility directly from the ED for OT/PT.  While at skilled nursing facility patient will be followed for headaches treated with Imitrex, neuropathy treated with Neurontin and hypertension treated with Lasix and metoprolol.  Past Medical History:  Diagnosis Date  . Acute kidney injury (HCC)   . Allergy   . Anxiety   . Arthritis    "99% of my body" (11/11/2016)  . Asthma   . Bursitis of both hips   . Cataract   . Cervical scoliosis   . Chronic back pain    "all over my back"  (11/11/2016)  . Depressive disorder, not elsewhere classified   . Enthesopathy of hip region   . Environmental allergies    "I take Claritin qd; 365 days/year" (11/11/2016)  . Essential hypertension, benign   . Fibromyalgia   . Frequent falls   . GERD (gastroesophageal reflux disease)   . Headache    "at least 1/wk; may last for 2 days or so" (11/11/2016)  . History of blood transfusion 1985   w/hysterectomy  . Lumbar stenosis   . Migraine    "a few/month" (11/11/2016)  . Muscle weakness (generalized)   . Myalgia and myositis, unspecified   . Osteoporosis   . Other abnormal blood chemistry   . Other malaise and fatigue   . Raynaud's syndrome   . Sciatic nerve pain, left   . Sciatica   . Unspecified vitamin D deficiency     Past Surgical History:  Procedure Laterality Date  . ABDOMINAL HYSTERECTOMY  1985  . CESAREAN SECTION  1976; 1979  . SHOULDER OPEN ROTATOR CUFF REPAIR Left     Allergies as of 09/16/2019   No Known Allergies     Medication List       Accurate as of September 16, 2019 11:59 PM. If you have any questions, ask your nurse or doctor.        acetaminophen 500 MG tablet Commonly known as: TYLENOL Take 500 mg by mouth every 6 (six) hours as needed for moderate pain or fever.   amitriptyline 150  MG tablet Commonly known as: ELAVIL Take 150 mg by mouth at bedtime. What changed: Another medication with the same name was removed. Continue taking this medication, and follow the directions you see here. Changed by: Merrilee Seashore, MD   ascorbic acid 500 MG tablet Commonly known as: VITAMIN C Take 1 tablet (500 mg total) by mouth 2 (two) times daily.   aspirin EC 81 MG tablet Take 81 mg by mouth daily.   citalopram 20 MG tablet Commonly known as: CELEXA TAKE 1 TABLET BY MOUTH EVERY DAY   famotidine 20 MG tablet Commonly known as: PEPCID Take 1 tablet (20 mg total) by mouth at bedtime.   fluticasone 50 MCG/ACT nasal spray Commonly known as:  FLONASE Place 2 sprays into both nostrils daily as needed for allergies or rhinitis.   furosemide 40 MG tablet Commonly known as: LASIX TAKE 1 TABLET BY MOUTH EVERY DAY   gabapentin 400 MG capsule Commonly known as: NEURONTIN TAKE 3 CAPSULES TWICE DAILY  AND TAKE 2 CAPSULES AT BEDTIME  FOR  NEUROPATHY   loratadine 10 MG tablet Commonly known as: CLARITIN Take 10 mg by mouth daily.   metoprolol tartrate 50 MG tablet Commonly known as: LOPRESSOR Take one tablet by mouth twice daily for blood pressure   oxyCODONE 15 MG immediate release tablet Commonly known as: ROXICODONE Take 1 tablet (15 mg total) by mouth every 6 (six) hours as needed for pain.   SUMAtriptan 25 MG tablet Commonly known as: IMITREX TAKE 1 TAB EVERY 2 HRS AS NEEDED FOR MIGRAINE OR HEADACHE. MAY REPEAT IN 2 HRS IF PERSISTS/RECURS.   vitamin B-12 500 MCG tablet Commonly known as: CYANOCOBALAMIN Take 500 mcg by mouth daily.   Vitamin D3 50 MCG (2000 UT) Tabs Take 2,000 Units by mouth daily.   zinc sulfate 220 (50 Zn) MG capsule TAKE 1 CAPSULE (220 MG TOTAL) BY MOUTH DAILY.       No orders of the defined types were placed in this encounter.   Immunization History  Administered Date(s) Administered  . Fluad Quad(high Dose 65+) 03/29/2019  . Influenza,inj,Quad PF,6+ Mos 05/23/2015, 05/28/2016, 03/24/2018  . Influenza-Unspecified 07/07/2012, 02/28/2014  . Pneumococcal Conjugate-13 07/14/2014  . Pneumococcal Polysaccharide-23 09/11/2017  . Tdap 01/26/2016    Social History   Tobacco Use  . Smoking status: Never Smoker  . Smokeless tobacco: Never Used  Substance Use Topics  . Alcohol use: No    Alcohol/week: 0.0 standard drinks    Family history is   Family History  Problem Relation Age of Onset  . Dementia Mother   . Heart disease Mother   . Hypertension Mother   . Diabetes Mother   . Diabetes Sister   . Cancer Sister        lung  . Cancer Other        Nepher (Mothers side)  .  Diabetes Maternal Grandmother   . Arthritis Maternal Grandmother   . Diabetes Cousin        Mother's side  . Arthritis Sister   . Arthritis Cousin        Mother's side   . Colon cancer Neg Hx   . Esophageal cancer Neg Hx   . Liver cancer Neg Hx   . Pancreatic cancer Neg Hx   . Rectal cancer Neg Hx   . Stomach cancer Neg Hx       Review of Systems  GENERAL:  no fevers, fatigue, appetite changes SKIN: No itching, or rash EYES: No eye  pain, redness, discharge EARS: No earache, tinnitus, change in hearing NOSE: No congestion, drainage or bleeding  MOUTH/THROAT: No mouth or tooth pain, No sore throat RESPIRATORY: No cough, wheezing, SOB CARDIAC: No chest pain, palpitations, lower extremity edema  GI: No abdominal pain, No N/V/D or constipation, No heartburn or reflux  GU: No dysuria, frequency or urgency, or incontinence  MUSCULOSKELETAL: No unrelieved bone/joint pain NEUROLOGIC: No headache, dizziness or focal weakness PSYCHIATRIC: No c/o anxiety or sadness   Vitals:   09/16/19 1453  BP: 97/60  Pulse: 66  Resp: 18  Temp: (!) 97.5 F (36.4 C)    SpO2 Readings from Last 1 Encounters:  09/15/19 100%   Body mass index is 38.62 kg/m.     Physical Exam  GENERAL APPEARANCE: Alert, conversant,  No acute distress.  SKIN: No diaphoresis rash HEAD: Normocephalic, atraumatic  EYES: Conjunctiva/lids clear. Pupils round, reactive. EOMs intact.  EARS: External exam WNL, canals clear. Hearing grossly normal.  NOSE: No deformity or discharge.  MOUTH/THROAT: Lips w/o lesions  RESPIRATORY: Breathing is even, unlabored. Lung sounds are clear   CARDIOVASCULAR: Heart RRR no murmurs, rubs or gallops. No peripheral edema.   GASTROINTESTINAL: Abdomen is soft, non-tender, not distended w/ normal bowel sounds. GENITOURINARY: Bladder non tender, not distended  MUSCULOSKELETAL: No abnormal joints or musculature NEUROLOGIC:  Cranial nerves 2-12 grossly intact. Moves all extremities   PSYCHIATRIC: Mood and affect appropriate to situation, no behavioral issues  Patient Active Problem List   Diagnosis Date Noted  . Acute kidney injury (HCC) 05/10/2019  . Near syncope 05/09/2019  . Fall at home, initial encounter 05/09/2019  . History of 2019 novel coronavirus disease (COVID-19) 05/09/2019  . Anemia of chronic disease 05/09/2019  . Acute on chronic renal insufficiency 05/09/2019  . COVID-19 virus infection 10/12/2018  . CAP (community acquired pneumonia) 10/06/2018  . Polyneuropathy in diseases classified elsewhere (HCC) 10/04/2018  . Prediabetes 09/12/2017  . High risk medication use 09/12/2017  . Frequent falls 09/12/2017  . Chronic pain syndrome 08/14/2017  . Gastroesophageal reflux disease 08/14/2017  . Cough variant asthma 03/14/2017  . Pulmonary hypertension (HCC) 03/12/2017  . Right-sided chest pain 11/11/2016  . Asthma 11/11/2016  . Atypical chest pain   . Costochondritis   . Chronic congestive heart failure (HCC)   . MDD (major depressive disorder), recurrent episode (HCC) 05/23/2015  . Allergic rhinitis 11/03/2014  . Paresthesia 09/08/2014  . Peripheral neuropathy 07/14/2014  . Morbid obesity due to excess calories (HCC) 07/14/2014  . Pain in limb 07/13/2014  . Abnormality of gait 07/13/2014  . Fibromyalgia 06/13/2014  . Spinal stenosis 01/27/2013  . HTN (hypertension) 11/30/2012      Labs reviewed: Basic Metabolic Panel:    Component Value Date/Time   NA 142 09/12/2019 1240   NA 137 10/28/2018 1330   K 3.6 09/12/2019 1240   CL 102 09/12/2019 1240   CO2 27 09/12/2019 1240   GLUCOSE 104 (H) 09/12/2019 1240   BUN 15 09/12/2019 1240   BUN 15 10/28/2018 1330   CREATININE 1.70 (H) 09/12/2019 1240   CREATININE 1.33 (H) 03/29/2019 1011   CALCIUM 9.6 09/12/2019 1240   PROT 7.6 09/12/2019 1240   PROT 7.0 10/28/2018 1330   ALBUMIN 4.2 09/12/2019 1240   ALBUMIN 4.0 10/28/2018 1330   AST 17 09/12/2019 1240   ALT 16 09/12/2019 1240   ALKPHOS  71 09/12/2019 1240   BILITOT 0.6 09/12/2019 1240   BILITOT 0.6 10/28/2018 1330   GFRNONAA 31 (L) 09/12/2019 1240  GFRNONAA 41 (L) 03/29/2019 1011   GFRAA 36 (L) 09/12/2019 1240   GFRAA 48 (L) 03/29/2019 1011    Recent Labs    10/10/18 0402 10/10/18 0402 10/11/18 0344 10/11/18 0344 10/12/18 0402 10/28/18 0000 05/13/19 0429 07/15/19 1805 09/12/19 1240  NA 138   < > 135   < > 136   < > 137 138 142  K 4.0   < > 3.6   < > 4.1   < > 3.9 3.8 3.6  CL 98   < > 96*   < > 99   < > 101 101 102  CO2 26   < > 25   < > 24   < > 28 26 27   GLUCOSE 89   < > 90   < > 87   < > 95 81 104*  BUN 14   < > 16   < > 13   < > 20 18 15   CREATININE 0.72   < > 0.87   < > 0.89   < > 1.55* 1.49* 1.70*  CALCIUM 9.1   < > 8.9   < > 9.0   < > 8.8* 9.0 9.6  MG 2.2  --  2.2  --  2.1  --   --   --   --    < > = values in this interval not displayed.   Liver Function Tests: Recent Labs    10/11/18 0344 10/11/18 0344 10/28/18 0000 10/28/18 0000 10/28/18 1330 03/29/19 1011 09/12/19 1240  AST 19   < > 11*   < > 11 11 17   ALT 14   < > 9   < > 9 6 16   ALKPHOS 71   < > 61  --  61  --  71  BILITOT 0.7  --   --   --  0.6 0.8 0.6  PROT 7.5  --   --   --  7.0 6.8 7.6  ALBUMIN 3.2*  --   --   --  4.0  --  4.2   < > = values in this interval not displayed.   Recent Labs    10/05/18 1201  LIPASE 17   No results for input(s): AMMONIA in the last 8760 hours. CBC: Recent Labs    10/11/18 0344 10/28/18 0000 10/28/18 0000 10/28/18 1330 03/29/19 1011 05/09/19 0328 05/10/19 0411 07/15/19 1805 09/12/19 1240  WBC   < > 5.3   < > 5.3 5.7   < > 6.1 6.0 7.7  NEUTROABS  --  2  --  1.9 2,491  --   --   --   --   HGB   < > 11.8*  --  11.8 11.3*   < > 11.3* 11.9* 11.3*  HCT   < > 35*  --  35.0 34.7*   < > 35.5* 38.6 36.9  MCV   < >  --   --  81 85.3   < > 88.3 90.4 89.3  PLT   < > 422*  --  422 334   < > 354 331 337   < > = values in this interval not displayed.   Lipid Recent Labs    03/29/19 1011  CHOL  182  HDL 62  LDLCALC 102*  TRIG 89    Cardiac Enzymes: Recent Labs    10/05/18 1201 10/06/18 0516  TROPONINI <0.03 0.05*   BNP: Recent Labs    10/06/18 0319 05/10/19 0411  BNP 34.6 312.3*   No results found for: [facility] Lab Results  Component Value Date   HGBA1C 5.3 03/29/2019   Lab Results  Component Value Date   TSH 1.346 05/09/2019   No results found for: [id number] Lab Results  Component Value Date   FOLATE 6.3 07/13/2014   Lab Results  Component Value Date   FERRITIN 248 10/10/2018    Imaging and Procedures obtained prior to SNF admission: DG HIP UNILAT W OR W/O PELVIS 2-3 VIEWS RIGHT  Result Date: 09/12/2019 CLINICAL DATA:  Status post fall, pain EXAM: DG HIP (WITH OR WITHOUT PELVIS) 2-3V RIGHT COMPARISON:  None. FINDINGS: There is no evidence of hip fracture or dislocation. There is no evidence of arthropathy or other focal bone abnormality. Subchondral sclerosis around the pubic symphysis as can be seen with osteitis pubis. IMPRESSION: No acute osseous injury of the right hip. Electronically Signed   By: Elige Ko   On: 09/12/2019 13:12     Not all labs, radiology exams or other studies done during hospitalization come through on my EPIC note; however they are reviewed by me.    Assessment and Plan  Bilateral lower extremity weakness/Spinal stenosis -no fractures found SNF-for OT/PT  Hypertension SNF-uncontrolled; continue Lasix 40 mg every other day and metoprolol 50 mg twice daily  Polyneuropathy SNF-continue Neurontin 1200 mg twice daily milligrams nightly  Migraine headaches  SNF-continue Imitrex 25 mg as directed  Depression SNF-appears controlled; continue Elavil 150 mg daily.   Time spent greater than 45 minutes;> 50% of time with patient was spent reviewing records, labs, tests and studies, counseling and developing plan of care  Margit Hanks, MD

## 2019-09-16 NOTE — Telephone Encounter (Signed)
Just FYI this patient is in a facility now.

## 2019-09-18 ENCOUNTER — Encounter: Payer: Self-pay | Admitting: Internal Medicine

## 2019-09-18 DIAGNOSIS — R29898 Other symptoms and signs involving the musculoskeletal system: Secondary | ICD-10-CM | POA: Insufficient documentation

## 2019-09-18 DIAGNOSIS — G43909 Migraine, unspecified, not intractable, without status migrainosus: Secondary | ICD-10-CM | POA: Insufficient documentation

## 2019-09-19 DIAGNOSIS — I10 Essential (primary) hypertension: Secondary | ICD-10-CM | POA: Diagnosis not present

## 2019-09-19 DIAGNOSIS — D649 Anemia, unspecified: Secondary | ICD-10-CM | POA: Diagnosis not present

## 2019-09-19 LAB — CBC AND DIFFERENTIAL
HCT: 34 — AB (ref 36–46)
Hemoglobin: 11 — AB (ref 12.0–16.0)
Neutrophils Absolute: 2
Platelets: 299 (ref 150–399)
WBC: 6.2

## 2019-09-19 LAB — BASIC METABOLIC PANEL
BUN: 15 (ref 4–21)
CO2: 25 — AB (ref 13–22)
Chloride: 100 (ref 99–108)
Creatinine: 1.1 (ref 0.5–1.1)
Glucose: 88
Potassium: 4 (ref 3.4–5.3)
Sodium: 140 (ref 137–147)

## 2019-09-19 LAB — CBC: RBC: 3.99 (ref 3.87–5.11)

## 2019-09-19 LAB — COMPREHENSIVE METABOLIC PANEL
Calcium: 9.4 (ref 8.7–10.7)
GFR calc Af Amer: 61.31
GFR calc non Af Amer: 52.9

## 2019-09-20 DIAGNOSIS — I4581 Long QT syndrome: Secondary | ICD-10-CM | POA: Diagnosis not present

## 2019-09-20 DIAGNOSIS — R0789 Other chest pain: Secondary | ICD-10-CM | POA: Diagnosis not present

## 2019-09-20 DIAGNOSIS — R079 Chest pain, unspecified: Secondary | ICD-10-CM | POA: Diagnosis not present

## 2019-09-20 DIAGNOSIS — R45851 Suicidal ideations: Secondary | ICD-10-CM | POA: Diagnosis not present

## 2019-09-20 DIAGNOSIS — R0602 Shortness of breath: Secondary | ICD-10-CM | POA: Diagnosis not present

## 2019-09-20 DIAGNOSIS — R918 Other nonspecific abnormal finding of lung field: Secondary | ICD-10-CM | POA: Diagnosis not present

## 2019-09-20 DIAGNOSIS — R9431 Abnormal electrocardiogram [ECG] [EKG]: Secondary | ICD-10-CM | POA: Diagnosis not present

## 2019-09-21 ENCOUNTER — Non-Acute Institutional Stay (SKILLED_NURSING_FACILITY): Payer: Medicare HMO | Admitting: Internal Medicine

## 2019-09-21 ENCOUNTER — Encounter: Payer: Self-pay | Admitting: Internal Medicine

## 2019-09-21 DIAGNOSIS — G894 Chronic pain syndrome: Secondary | ICD-10-CM | POA: Diagnosis not present

## 2019-09-21 DIAGNOSIS — R0789 Other chest pain: Secondary | ICD-10-CM

## 2019-09-21 DIAGNOSIS — Z7401 Bed confinement status: Secondary | ICD-10-CM | POA: Diagnosis not present

## 2019-09-21 DIAGNOSIS — F29 Unspecified psychosis not due to a substance or known physiological condition: Secondary | ICD-10-CM | POA: Diagnosis not present

## 2019-09-21 DIAGNOSIS — G63 Polyneuropathy in diseases classified elsewhere: Secondary | ICD-10-CM | POA: Diagnosis not present

## 2019-09-21 DIAGNOSIS — F330 Major depressive disorder, recurrent, mild: Secondary | ICD-10-CM | POA: Diagnosis not present

## 2019-09-21 DIAGNOSIS — M2550 Pain in unspecified joint: Secondary | ICD-10-CM | POA: Diagnosis not present

## 2019-09-21 DIAGNOSIS — I10 Essential (primary) hypertension: Secondary | ICD-10-CM | POA: Diagnosis not present

## 2019-09-21 DIAGNOSIS — F4321 Adjustment disorder with depressed mood: Secondary | ICD-10-CM | POA: Diagnosis not present

## 2019-09-21 NOTE — Telephone Encounter (Signed)
This encounter was created in error - please disregard.

## 2019-09-21 NOTE — Progress Notes (Signed)
Location:    [facility] Room Number: 110/P Place of Service:  SNF [phone]) Provider:  Juanetta Beets, MD  Patient Care Team: Margit Hanks, MD as PCP - General (Internal Medicine)  Extended Emergency Contact Information Primary Emergency Contact: Jerrye Beavers [place] Mobile Phone: [phone] Relation: Daughter Secondary Emergency Contact: Dutch,Lashawn Mobile Phone: [phone] Relation: Daughter  Code Status:  Full Code Goals of care: Advanced Directive information Advanced Directives 09/21/2019  Does Patient Have a Medical Advance Directive? Yes  Type of Advance Directive (No Data)  Does patient want to make changes to medical advance directive? No - Patient declined  Copy of Healthcare Power of Attorney in Chart? -  Would patient like information on creating a medical advance directive? -     Chief Complaint  Patient presents with  . Follow-up    ER Visit  For chest pain  HPI:  Pt is a 69 y.o. female seen today for an acute visit status post ER visit..  Patient was sent to the ER yesterday after complaining of chest pain in the facility.  She was sent to the ER where work-up was negative for an acute cardiac process-troponins were negative EKG was not showing anything acute.  Labs appeared to be relatively stable potassium was minimally low at 3.4.  Her BNP was 81.  However during the triage process patient apparently admitted to having passive suicidal idealizations-and she was evaluated by the psychiatric team in the hospital thought to be safe for discharge to facility.  Since she no longer has suicidal idealization and had no plan of action  Talking with patient today she does not report suicidal idealization says at times she does get frustrated and would like her pain under better control she had been on oxycodone and per nursing time she has received that she has tolerated it well.  In the  past     We will restart her on a limited dose of 15 mg every 8 hours as needed and monitor.  She does have a diagnosis of chronic pain neuropathy she is actually on Neurontin 1200 mg twice daily-she also has a diagnosis of fibromyalgia and spinal stenosis  She is not complaining of chest pain today .  She  has a history of hypertension and continues on Lopressor 50 mg twice daily in addition to her Lasix.  Which she receives 40 mg a day  Currently she is lying in bed comfortably she is pleasant and alert-her main complaint is general pain and feels oxycodone would really help.  I do note for depression she does continue on Elavil 150 mg a day as well as Celexa 20 mg a day       Past Medical History:  Diagnosis Date  . Acute kidney injury (HCC)   . Allergy   . Anxiety   . Arthritis    "99% of my body" (11/11/2016)  . Asthma   . Bursitis of both hips   . Cataract   . Cervical scoliosis   . Chronic back pain    "all over my back" (11/11/2016)  . Depressive disorder, not elsewhere classified   . Enthesopathy of hip region   . Environmental allergies    "I take Claritin qd; 365 days/year" (11/11/2016)  . Essential hypertension, benign   . Fibromyalgia   . Frequent falls   . GERD (gastroesophageal reflux disease)   . Headache    "at least 1/wk;  may last for 2 days or so" (11/11/2016)  . History of blood transfusion 1985   w/hysterectomy  . Lumbar stenosis   . Migraine    "a few/month" (11/11/2016)  . Muscle weakness (generalized)   . Myalgia and myositis, unspecified   . Osteoporosis   . Other abnormal blood chemistry   . Other malaise and fatigue   . Raynaud's syndrome   . Sciatic nerve pain, left   . Sciatica   . Unspecified vitamin D deficiency    Past Surgical History:  Procedure Laterality Date  . ABDOMINAL HYSTERECTOMY  1985  . [place]; 1979  . SHOULDER OPEN ROTATOR CUFF REPAIR Left     No Known Allergies  Outpatient Encounter Medications  as of 09/21/2019  Medication Sig  . acetaminophen (TYLENOL) 500 MG tablet Take 500 mg by mouth every 6 (six) hours as needed for moderate pain or fever.  Marland Kitchen acetaminophen (TYLENOL) 500 MG tablet Take 1,000 mg by mouth every 8 (eight) hours. For Pain  . amitriptyline (ELAVIL) 150 MG tablet Take 150 mg by mouth at bedtime.  Marland Kitchen aspirin EC 81 MG tablet Take 81 mg by mouth daily.  . Cholecalciferol (VITAMIN D3) 2000 units TABS Take 2,000 Units by mouth daily.   . citalopram (CELEXA) 20 MG tablet TAKE 1 TABLET BY MOUTH EVERY DAY  . famotidine (PEPCID) 20 MG tablet Take 1 tablet (20 mg total) by mouth at bedtime.  . fluticasone (FLONASE) 50 MCG/ACT nasal spray Place 2 sprays into both nostrils daily as needed for allergies or rhinitis.  . furosemide (LASIX) 40 MG tablet TAKE 1 TABLET BY MOUTH EVERY DAY  . gabapentin (NEURONTIN) 400 MG capsule TAKE 3 CAPSULES BY MOUTH TWICE DAILY FOR POLYNEUROPATHY  . loratadine (CLARITIN) 10 MG tablet Take 10 mg by mouth daily.  . metoprolol tartrate (LOPRESSOR) 50 MG tablet Take one tablet by mouth twice daily for blood pressure  . oxyCODONE (ROXICODONE) 15 MG immediate release tablet Take 15 mg by mouth every 8 (eight) hours as needed for pain.  . polyethylene glycol (MIRALAX / GLYCOLAX) 17 g packet Take 17 g by mouth daily.  . SUMAtriptan (IMITREX) 25 MG tablet TAKE 1 TAB EVERY 2 HRS AS NEEDED FOR MIGRAINE OR HEADACHE. MAY REPEAT IN 2 HRS IF PERSISTS/RECURS.  . vitamin B-12 (CYANOCOBALAMIN) 500 MCG tablet Take 500 mcg by mouth daily.  . vitamin C (VITAMIN C) 500 MG tablet Take 1 tablet (500 mg total) by mouth 2 (two) times daily.  Marland Kitchen zinc sulfate 220 (50 Zn) MG capsule TAKE 1 CAPSULE (220 MG TOTAL) BY MOUTH DAILY.  . [DISCONTINUED] oxyCODONE (OXYCONTIN) 15 mg 12 hr tablet Take 15 mg by mouth every 8 (eight) hours as needed.  . [DISCONTINUED] gabapentin (NEURONTIN) 400 MG capsule TAKE 3 CAPSULES TWICE DAILY  AND TAKE 2 CAPSULES AT BEDTIME  FOR  NEUROPATHY (Patient taking  differently: TAKE 3 CAPSULES TWICE DAILY)  . [DISCONTINUED] oxyCODONE (ROXICODONE) 15 MG immediate release tablet Take 1 tablet (15 mg total) by mouth every 6 (six) hours as needed for pain. (Patient taking differently: Take 15 mg by mouth every 8 (eight) hours as needed for pain. )   No facility-administered encounter medications on file as of 09/21/2019.    Review of Systems   In general she is not complaining of any fever or chills.  Skin does not complain of rashes or itching.  Head ears eyes nose mouth and throat is not complain of visual changes or sore throat.  Respiratory does not complain of being short of breath or having a cough.  Cardiac does not complain of chest pain or increasing edema.  GI does not complain of abdominal discomfort nausea or vomiting.  She does complain of constipation at times  GU no complaints of dysuria.  Musculoskeletal does have pain issues with a history of spinal stenosis fibromyalgia with a history of what appears to be chronic pain.  Neurologic is not complaining of dizziness does have a history of chronic headaches and does have orders for Imitrex as needed.  Does not currently complain of a headache does have history of neuropathy and continues on Neurontin as well.  Psych issues as noted above currently denies any suicidal idealization but feels pain issues at times will make her feel down-.    Immunization History  Administered Date(s) Administered  . Fluad Quad(high Dose 65+) 03/29/2019  . Influenza,inj,Quad PF,6+ Mos 05/23/2015, 05/28/2016, 03/24/2018  . Influenza-Unspecified 07/07/2012, 02/28/2014  . Pneumococcal Conjugate-13 07/14/2014  . Pneumococcal Polysaccharide-23 09/11/2017  . Tdap 01/26/2016   Pertinent  Health Maintenance Due  Topic Date Due  . COLONOSCOPY  Never done  . MAMMOGRAM  09/04/2019  . INFLUENZA VACCINE  Completed  . DEXA SCAN  Completed  . PNA vac Low Risk Adult  Completed   Fall Risk  07/20/2019  07/04/2019 03/29/2019 12/03/2018 10/26/2018  Falls in the past year? 1 1 1 1 1   Number falls in past yr: 1 1 1 1 1   Comment - - - - -  Injury with Fall? 1 1 0 0 1  Comment - - - - -  Risk Factor Category  - - - - -  Risk for fall due to : - - - - -  Follow up - - - - -   Functional Status Survey:    Vitals:   09/21/19 1505  BP: 125/79  Pulse: 78  Resp: 18  Temp: (!) 97 F (36.1 C)  TempSrc: Oral  SpO2: 95%  Weight: 225 lb 6.4 oz (102.2 kg)  Height: 5\' 4"  (1.626 m)   Body mass index is 38.69 kg/m. Physical Exam   In general this is a pleasant elderly female in no distress lying in bed she does not appear to be in any distress but says at times she has significant pain.  Her skin is warm and dry.  Eyes visual acuity appears to be intact sclera and conjunctive are clear.  Chest is clear to auscultation there is no labored breathing.  Heart is regular rate and rhythm without murmur gallop or rub she has trace lower extremity edema.  Abdomen is soft nontender with positive bowel sounds.  Musculoskeletal Limited exam since she is in bed but is able to move all extremities x4.  Neurologic appears grossly intact her speech is clear cannot appreciate lateralizing findings.  Psych she is pleasant appropriate-denied any suicidal idealization but says her pain bothers her at times and makes her feel down.    Labs reviewed:  September 20, 2019.  WBC 6.0 hemoglobin 11.7 platelets 368.  Sodium 139 potassium 3.4 BUN 24 creatinine 1.43.  BNP was 81.  September 19, 2019.  White count was 6.2 hemoglobin 11.0 platelets 299.  Sodium 140 potassium 4 BUN 14.6 creatinine 1.08.  Much 15th 2021.  WBC was 7.7 hemoglobin 11.3 platelets 337.  Sodium 142 potassium 3.6 BUN 15 creatinine 1.7.  Liver function tests were in normal limits.   Recent Labs    10/10/18 0402 10/10/18 0402  10/11/18 0344 10/11/18 0344 10/12/18 0402 10/28/18 0000 05/13/19 0429 07/15/19 1805 09/12/19 1240   NA 138   < > 135   < > 136   < > 137 138 142  K 4.0   < > 3.6   < > 4.1   < > 3.9 3.8 3.6  CL 98   < > 96*   < > 99   < > 101 101 102  CO2 26   < > 25   < > 24   < > 28 26 27   GLUCOSE 89   < > 90   < > 87   < > 95 81 104*  BUN 14   < > 16   < > 13   < > 20 18 15   CREATININE 0.72   < > 0.87   < > 0.89   < > 1.55* 1.49* 1.70*  CALCIUM 9.1   < > 8.9   < > 9.0   < > 8.8* 9.0 9.6  MG 2.2  --  2.2  --  2.1  --   --   --   --    < > = values in this interval not displayed.   Recent Labs    10/11/18 0344 10/11/18 0344 10/28/18 0000 10/28/18 0000 10/28/18 1330 03/29/19 1011 09/12/19 1240  AST 19   < > 11*   < > 11 11 17   ALT 14   < > 9   < > 9 6 16   ALKPHOS 71   < > 61  --  61  --  71  BILITOT 0.7  --   --   --  0.6 0.8 0.6  PROT 7.5  --   --   --  7.0 6.8 7.6  ALBUMIN 3.2*  --   --   --  4.0  --  4.2   < > = values in this interval not displayed.   Recent Labs    10/11/18 0344 10/28/18 0000 10/28/18 0000 10/28/18 1330 03/29/19 1011 05/09/19 0328 05/10/19 0411 07/15/19 1805 09/12/19 1240  WBC   < > 5.3   < > 5.3 5.7   < > 6.1 6.0 7.7  NEUTROABS  --  2  --  1.9 2,491  --   --   --   --   HGB   < > 11.8*  --  11.8 11.3*   < > 11.3* 11.9* 11.3*  HCT   < > 35*  --  35.0 34.7*   < > 35.5* 38.6 36.9  MCV   < >  --   --  81 85.3   < > 88.3 90.4 89.3  PLT   < > 422*  --  422 334   < > 354 331 337   < > = values in this interval not displayed.   Lab Results  Component Value Date   TSH 1.346 05/09/2019   Lab Results  Component Value Date   HGBA1C 5.3 03/29/2019   Lab Results  Component Value Date   CHOL 182 03/29/2019   HDL 62 03/29/2019   LDLCALC 102 (H) 03/29/2019   TRIG 89 03/29/2019   CHOLHDL 2.9 03/29/2019    Significant Diagnostic Results in last 30 days:  DG HIP UNILAT W OR W/O PELVIS 2-3 VIEWS RIGHT  Result Date: 09/12/2019 CLINICAL DATA:  Status post fall, pain EXAM: DG HIP (WITH OR WITHOUT PELVIS) 2-3V RIGHT COMPARISON:  None. FINDINGS: There is no evidence  of hip fracture or dislocation. There is no evidence of arthropathy or other focal bone abnormality. Subchondral sclerosis around the pubic symphysis as can be seen with osteitis pubis. IMPRESSION: No acute osseous injury of the right hip. Electronically Signed   By: Elige Ko   On: 09/12/2019 13:12    Assessment/Plan  #1 history of chest pain-this appears to have been thought to be noncardiac per work-up in the ER-troponin was negative EKG was nondiagnostic-lab work appeared fairly unremarkable.  At this point continue to monitor.  She does not complain of any chest discomfort today  2.  History of depression with question suicidal idealization-please see discussion Alphonzo Cruise denies suicide idealization today when I spoke with her-says her pain does bother her at times and makes her feel down.  She continues on Elavil 150 mg a day and Celexa 20 mg a day..  At this point staff will continue to monitor for any further suicide idealization--she appears to be in decent spirits today when  nursing and I spoke with her  3.  In regards to pain issues with a history of fibromyalgia neuropathy osteoarthritis and spinal stenosis-she has had orders for oxycodone 15 mg-will reinstitute this every 8 hours as needed and monitor will give a limited supply for 14 days and monitor-nursing says she has tolerated this well--- she also continues on Neurontin 1200 mg twice daily-and has Imitrex as needed for headaches.  4.  Hypertension she continues on Lasix 40 mg every other day and Lopressor 50 mg twice daily-at this point will monitor.  Appears to be controlled with systolic in the 120s today-  5.  Borderline low hypokalemia we will supplement this with 20 mEq of potassium x1 dose and check a BMP tomorrow.  6.  History of renal insufficiency?  She has had variable creatinines ranging from 1.08 recently up to 1.7 per chart review will update this.  7.  Constipation-will add MiraLAX daily and  monitor   [id number]--of note greater than 35 minutes spent assessing patient-reviewing her chart labs including labs and charting from ER visit as well as previous visits.  Also discussion with patient at bedside with nursing present.  And formulating a plan of care.  Of note greater than 50% of time spent coordinating formulating a plan of care with input as noted above including discussion with patient and nursing.

## 2019-09-22 DIAGNOSIS — I10 Essential (primary) hypertension: Secondary | ICD-10-CM | POA: Diagnosis not present

## 2019-09-22 DIAGNOSIS — D649 Anemia, unspecified: Secondary | ICD-10-CM | POA: Diagnosis not present

## 2019-09-22 LAB — COMPREHENSIVE METABOLIC PANEL
Calcium: 9.2 (ref 8.7–10.7)
GFR calc Af Amer: 52.39
GFR calc non Af Amer: 45.2

## 2019-09-22 LAB — BASIC METABOLIC PANEL
BUN: 14 (ref 4–21)
CO2: 25 — AB (ref 13–22)
Chloride: 102 (ref 99–108)
Creatinine: 1.2 — AB (ref 0.5–1.1)
Glucose: 75
Potassium: 4.3 (ref 3.4–5.3)
Sodium: 140 (ref 137–147)

## 2019-09-22 LAB — CBC AND DIFFERENTIAL
HCT: 34 — AB (ref 36–46)
Hemoglobin: 11 — AB (ref 12.0–16.0)
Neutrophils Absolute: 2
Platelets: 315 (ref 150–399)
WBC: 5.7

## 2019-09-22 LAB — CBC: RBC: 3.97 (ref 3.87–5.11)

## 2019-09-23 ENCOUNTER — Encounter: Payer: Self-pay | Admitting: Internal Medicine

## 2019-09-26 ENCOUNTER — Encounter: Payer: Self-pay | Admitting: Internal Medicine

## 2019-09-26 ENCOUNTER — Non-Acute Institutional Stay (SKILLED_NURSING_FACILITY): Payer: Medicare HMO | Admitting: Internal Medicine

## 2019-09-26 DIAGNOSIS — I10 Essential (primary) hypertension: Secondary | ICD-10-CM | POA: Diagnosis not present

## 2019-09-26 DIAGNOSIS — G43009 Migraine without aura, not intractable, without status migrainosus: Secondary | ICD-10-CM

## 2019-09-26 DIAGNOSIS — G63 Polyneuropathy in diseases classified elsewhere: Secondary | ICD-10-CM

## 2019-09-26 DIAGNOSIS — R29898 Other symptoms and signs involving the musculoskeletal system: Secondary | ICD-10-CM

## 2019-09-26 DIAGNOSIS — J302 Other seasonal allergic rhinitis: Secondary | ICD-10-CM

## 2019-09-26 DIAGNOSIS — F332 Major depressive disorder, recurrent severe without psychotic features: Secondary | ICD-10-CM | POA: Diagnosis not present

## 2019-09-26 DIAGNOSIS — M48062 Spinal stenosis, lumbar region with neurogenic claudication: Secondary | ICD-10-CM | POA: Diagnosis not present

## 2019-09-26 MED ORDER — FUROSEMIDE 40 MG PO TABS: 40.0000 mg | ORAL_TABLET | Freq: Every day | ORAL | 0 refills | Status: DC

## 2019-09-26 MED ORDER — VITAMIN B-12 500 MCG PO TABS: 500.0000 ug | ORAL_TABLET | Freq: Every day | ORAL | 0 refills | Status: DC

## 2019-09-26 MED ORDER — POLYETHYLENE GLYCOL 3350 17 G PO PACK: 17.0000 g | PACK | Freq: Every day | ORAL | 0 refills | Status: DC

## 2019-09-26 MED ORDER — SUMATRIPTAN SUCCINATE 25 MG PO TABS: ORAL_TABLET | ORAL | 0 refills | Status: DC

## 2019-09-26 MED ORDER — LORATADINE 10 MG PO TABS: 10.0000 mg | ORAL_TABLET | Freq: Every day | ORAL | 0 refills | Status: DC

## 2019-09-26 MED ORDER — OXYCODONE HCL 15 MG PO TABS: 15.0000 mg | ORAL_TABLET | Freq: Three times a day (TID) | ORAL | 0 refills | Status: DC | PRN

## 2019-09-26 MED ORDER — VITAMIN D3 50 MCG (2000 UT) PO TABS: 2000.0000 [IU] | ORAL_TABLET | Freq: Every day | ORAL | 0 refills | Status: DC

## 2019-09-26 MED ORDER — FAMOTIDINE 20 MG PO TABS: 20.0000 mg | ORAL_TABLET | Freq: Every day | ORAL | 0 refills | Status: DC

## 2019-09-26 MED ORDER — ASCORBIC ACID 500 MG PO TABS: 500.0000 mg | ORAL_TABLET | Freq: Two times a day (BID) | ORAL | Status: AC

## 2019-09-26 MED ORDER — GABAPENTIN 400 MG PO CAPS: 1200.0000 mg | ORAL_CAPSULE | Freq: Two times a day (BID) | ORAL | 0 refills | Status: DC

## 2019-09-26 MED ORDER — ZINC SULFATE 220 (50 ZN) MG PO CAPS: ORAL_CAPSULE | ORAL | 0 refills | Status: DC

## 2019-09-26 MED ORDER — METOPROLOL TARTRATE 50 MG PO TABS: ORAL_TABLET | ORAL | 0 refills | Status: DC

## 2019-09-26 MED ORDER — FLUTICASONE PROPIONATE 50 MCG/ACT NA SUSP: 2.0000 | Freq: Every day | NASAL | 0 refills | Status: DC | PRN

## 2019-09-26 MED ORDER — AMITRIPTYLINE HCL 150 MG PO TABS: 150.0000 mg | ORAL_TABLET | Freq: Every day | ORAL | 0 refills | Status: DC

## 2019-09-26 MED ORDER — CITALOPRAM HYDROBROMIDE 20 MG PO TABS: 20.0000 mg | ORAL_TABLET | Freq: Every day | ORAL | 0 refills | Status: DC

## 2019-09-26 NOTE — Progress Notes (Signed)
Location:  [facility] Room Number: 110-P Place of Service:  SNF (31)  PCP: Margit Hanks, MD Patient Care Team: Margit Hanks, MD as PCP - General (Internal Medicine)  Extended Emergency Contact Information Primary Emergency Contact: Jerrye Beavers [place] Mobile Phone: [phone] Relation: Daughter Secondary Emergency Contact: Dutch,Lashawn Mobile Phone: [phone] Relation: Daughter  No Known Allergies  Chief Complaint  Patient presents with  . Discharge Note    Patient is seen for discharge from SNF on 09/27/19     HPI:  69 y.o. female with arthritis, hypertension, fibromyalgia, GERD, lumbar stenosis and Raynaud's syndrome who presented to [facility] ED after a fall.  She was sitting on side of bed and her legs gave way when she stood and she slid to the floor.  She complained of right hip pain.  She admitted to frequent falls in the prior several months including the prior week.  Complains of bilateral leg weakness and occasionally legs giving way.  She denies faintness, lightheadedness, syncope, head injury, neck pain, back pain, chest pain, shortness of breath, abdominal pain, nausea, and vomiting.  She has had poor p.o. intake but no recent weight loss.  X-rays in the emergency department did not show any injury.  Patient was admitted to skilled nursing facility from the ED for OT/PT and is now ready to be discharged home with her daughter.    Past Medical History:  Diagnosis Date  . Acute kidney injury (HCC)   . Allergy   . Anxiety   . Arthritis    "99% of my body" (11/11/2016)  . Asthma   . Bursitis of both hips   . Cataract   . Cervical scoliosis   . Chronic back pain    "all over my back" (11/11/2016)  . Depressive disorder, not elsewhere classified   . Enthesopathy of hip region   . Environmental allergies    "I take Claritin qd; 365 days/year" (11/11/2016)  . Essential hypertension, benign    . Fibromyalgia   . Frequent falls   . GERD (gastroesophageal reflux disease)   . Headache    "at least 1/wk; may last for 2 days or so" (11/11/2016)  . History of blood transfusion 1985   w/hysterectomy  . Lumbar stenosis   . Migraine    "a few/month" (11/11/2016)  . Muscle weakness (generalized)   . Myalgia and myositis, unspecified   . Osteoporosis   . Other abnormal blood chemistry   . Other malaise and fatigue   . Raynaud's syndrome   . Sciatic nerve pain, left   . Sciatica   . Unspecified vitamin D deficiency     Past Surgical History:  Procedure Laterality Date  . ABDOMINAL HYSTERECTOMY  1985  . CESAREAN SECTION  1976; 1979  . SHOULDER OPEN ROTATOR CUFF REPAIR Left      reports that she has never smoked. She has never used smokeless tobacco. She reports that she does not drink alcohol or use drugs. Social History   Socioeconomic History  . Marital status: Divorced    Spouse name: Not on file  . Number of children: 3  . Years of education: 18  . Highest education level: Not on file  Occupational History    Comment: retired  Tobacco Use  . Smoking status: Never Smoker  . Smokeless tobacco: Never Used  Substance and Sexual Activity  . Alcohol use: No    Alcohol/week: 0.0 standard drinks  .  Drug use: No  . Sexual activity: Yes  Other Topics Concern  . Not on file  Social History Narrative   Patient lives with her grandson. Patient is retired.   Education some college.   Right handed.    Caffeine two cups daily.   Social Determinants of Health   Financial Resource Strain:   . Difficulty of Paying Living Expenses:   Food Insecurity:   . Worried About Programme researcher, broadcasting/film/video in the Last Year:   . Barista in the Last Year:   Transportation Needs:   . Freight forwarder (Medical):   Marland Kitchen Lack of Transportation (Non-Medical):   Physical Activity:   . Days of Exercise per Week:   . Minutes of Exercise per Session:   Stress:   . Feeling of Stress :    Social Connections:   . Frequency of Communication with Friends and Family:   . Frequency of Social Gatherings with Friends and Family:   . Attends Religious Services:   . Active Member of Clubs or Organizations:   . Attends Banker Meetings:   Marland Kitchen Marital Status:   Intimate Partner Violence:   . Fear of Current or Ex-Partner:   . Emotionally Abused:   Marland Kitchen Physically Abused:   . Sexually Abused:     Pertinent  Health Maintenance Due  Topic Date Due  . COLONOSCOPY  Never done  . MAMMOGRAM  09/04/2019  . INFLUENZA VACCINE  01/29/2020  . DEXA SCAN  Completed  . PNA vac Low Risk Adult  Completed    Medications: Allergies as of 09/26/2019   No Known Allergies     Medication List       Accurate as of September 26, 2019 11:59 PM. If you have any questions, ask your nurse or doctor.        acetaminophen 500 MG tablet Commonly known as: TYLENOL Take 1,000 mg by mouth every 8 (eight) hours. For Pain What changed: Another medication with the same name was removed. Continue taking this medication, and follow the directions you see here. Changed by: Merrilee Seashore, MD   amitriptyline 150 MG tablet Commonly known as: ELAVIL Take 1 tablet (150 mg total) by mouth at bedtime.   ascorbic acid 500 MG tablet Commonly known as: VITAMIN C Take 1 tablet (500 mg total) by mouth 2 (two) times daily.   aspirin EC 81 MG tablet Take 81 mg by mouth daily.   citalopram 20 MG tablet Commonly known as: CELEXA Take 1 tablet (20 mg total) by mouth daily.   famotidine 20 MG tablet Commonly known as: PEPCID Take 1 tablet (20 mg total) by mouth at bedtime.   fluticasone 50 MCG/ACT nasal spray Commonly known as: FLONASE Place 2 sprays into both nostrils daily as needed for allergies or rhinitis.   furosemide 40 MG tablet Commonly known as: LASIX Take 1 tablet (40 mg total) by mouth daily.   gabapentin 400 MG capsule Commonly known as: NEURONTIN Take 3 capsules (1,200 mg total) by  mouth 2 (two) times daily. TAKE 3 CAPSULES BY MOUTH TWICE DAILY FOR POLYNEUROPATHY What changed:   how much to take  how to take this  when to take this Changed by: Merrilee Seashore, MD   loratadine 10 MG tablet Commonly known as: CLARITIN Take 1 tablet (10 mg total) by mouth daily.   metoprolol tartrate 50 MG tablet Commonly known as: LOPRESSOR Take one tablet by mouth twice daily for blood pressure  oxyCODONE 15 MG immediate release tablet Commonly known as: ROXICODONE Take 1 tablet (15 mg total) by mouth every 8 (eight) hours as needed for pain.   polyethylene glycol 17 g packet Commonly known as: MIRALAX / GLYCOLAX Take 17 g by mouth daily.   SUMAtriptan 25 MG tablet Commonly known as: IMITREX TAKE 1 TAB EVERY 2 HRS AS NEEDED FOR MIGRAINE OR HEADACHE. MAY REPEAT IN 2 HRS IF PERSISTS/RECURS.   vitamin B-12 500 MCG tablet Commonly known as: CYANOCOBALAMIN Take 1 tablet (500 mcg total) by mouth daily.   Vitamin D3 50 MCG (2000 UT) Tabs Take 2,000 Units by mouth daily.   zinc sulfate 220 (50 Zn) MG capsule TAKE 1 CAPSULE (220 MG TOTAL) BY MOUTH DAILY.        Vitals:   09/26/19 1645  BP: 119/70  Pulse: 70  Resp: 18  Temp: 98 F (36.7 C)  TempSrc: Oral  SpO2: 95%  Weight: 224 lb 1.6 oz (101.7 kg)  Height: 5\' 4"  (1.626 m)   Body mass index is 38.47 kg/m.  Physical Exam  GENERAL APPEARANCE: Alert, conversant. No acute distress.  HEENT: Unremarkable. RESPIRATORY: Breathing is even, unlabored. Lung sounds are clear   CARDIOVASCULAR: Heart RRR no murmurs, rubs or gallops. No peripheral edema.  GASTROINTESTINAL: Abdomen is soft, non-tender, not distended w/ normal bowel sounds.  NEUROLOGIC: Cranial nerves 2-12 grossly intact. Moves all extremities   Labs reviewed: Basic Metabolic Panel: Recent Labs    10/10/18 0402 10/10/18 0402 10/11/18 0344 10/11/18 0344 10/12/18 0402 10/28/18 0000 05/13/19 0429 05/13/19 0429 07/15/19 1805 07/15/19 1805  09/12/19 1240 09/19/19 0000 09/22/19 0000  NA 138   < > 135   < > 136   < > 137   < > 138   < > 142 140 140  K 4.0   < > 3.6   < > 4.1   < > 3.9   < > 3.8   < > 3.6 4.0 4.3  CL 98   < > 96*   < > 99   < > 101   < > 101   < > 102 100 102  CO2 26   < > 25   < > 24   < > 28   < > 26   < > 27 25* 25*  GLUCOSE 89   < > 90   < > 87   < > 95  --  81  --  104*  --   --   BUN 14   < > 16   < > 13   < > 20   < > 18   < > 15 15 14   CREATININE 0.72   < > 0.87   < > 0.89   < > 1.55*   < > 1.49*   < > 1.70* 1.1 1.2*  CALCIUM 9.1   < > 8.9   < > 9.0   < > 8.8*   < > 9.0   < > 9.6 9.4 9.2  MG 2.2  --  2.2  --  2.1  --   --   --   --   --   --   --   --    < > = values in this interval not displayed.   No results found for: [facility] Liver Function Tests: Recent Labs    10/11/18 0344 10/11/18 0344 10/28/18 0000 10/28/18 0000 10/28/18 1330 03/29/19 1011 09/12/19 1240  AST 19   < >  11*   < > 11 11 17   ALT 14   < > 9   < > 9 6 16   ALKPHOS 71   < > 61  --  61  --  71  BILITOT 0.7  --   --   --  0.6 0.8 0.6  PROT 7.5  --   --   --  7.0 6.8 7.6  ALBUMIN 3.2*  --   --   --  4.0  --  4.2   < > = values in this interval not displayed.   Recent Labs    10/05/18 1201  LIPASE 17   No results for input(s): AMMONIA in the last 8760 hours. CBC: Recent Labs    03/29/19 1011 05/09/19 0328 05/10/19 0411 05/10/19 0411 07/15/19 1805 07/15/19 1805 09/12/19 1240 09/19/19 0000 09/22/19 0000  WBC 5.7   < > 6.1   < > 6.0   < > 7.7 6.2 5.7  NEUTROABS 2,491  --   --   --   --   --   --  2 2  HGB 11.3*   < > 11.3*   < > 11.9*   < > 11.3* 11.0* 11.0*  HCT 34.7*   < > 35.5*   < > 38.6   < > 36.9 34* 34*  MCV 85.3   < > 88.3  --  90.4  --  89.3  --   --   PLT 334   < > 354   < > 331   < > 337 299 315   < > = values in this interval not displayed.   Lipid Recent Labs    03/29/19 1011  CHOL 182  HDL 62  LDLCALC 102*  TRIG 89   Cardiac Enzymes: Recent Labs    10/05/18 1201 10/06/18 0516   TROPONINI <0.03 0.05*   BNP: Recent Labs    10/06/18 0319 05/10/19 0411  BNP 34.6 312.3*   CBG: No results for input(s): GLUCAP in the last 8760 hours.  Procedures and Imaging Studies During Stay: DG HIP UNILAT W OR W/O PELVIS 2-3 VIEWS RIGHT  Result Date: 09/12/2019 CLINICAL DATA:  Status post fall, pain EXAM: DG HIP (WITH OR WITHOUT PELVIS) 2-3V RIGHT COMPARISON:  None. FINDINGS: There is no evidence of hip fracture or dislocation. There is no evidence of arthropathy or other focal bone abnormality. Subchondral sclerosis around the pubic symphysis as can be seen with osteitis pubis. IMPRESSION: No acute osseous injury of the right hip. Electronically Signed   By: 13/10/20   On: 09/12/2019 13:12    Assessment/Plan:   Weakness of both lower extremities  Severe episode of recurrent major depressive disorder, without psychotic features (HCC) - Plan: citalopram (CELEXA) 20 MG tablet  Seasonal allergic rhinitis, unspecified trigger - Plan: fluticasone (FLONASE) 50 MCG/ACT nasal spray  Essential hypertension - Plan: metoprolol tartrate (LOPRESSOR) 50 MG tablet  Migraine without aura and without status migrainosus, not intractable - Plan: SUMAtriptan (IMITREX) 25 MG tablet  Spinal stenosis of lumbar region with neurogenic claudication  Polyneuropathy in diseases classified elsewhere (HCC) Bilateral lower extremity weakness/spinal stenosis  Hypertension Polyneuropathy  Migraine headaches  Depression  Patient is being discharged with the following home health services: None-will be with daughter for 1 week then with son in [place] after that  Patient is being discharged with the following durable medical equipment: None  Patient has been advised to f/u with their PCP in 1-2 weeks to bring them  up to date on their rehab stay.  Social services at facility was responsible for arranging this appointment.  Pt was provided with a 30 day supply of prescriptions for medications and  refills must be obtained from their PCP.  For controlled substances, a more limited supply may be provided adequate until PCP appointment only.    Margit Hanks MD

## 2019-09-29 ENCOUNTER — Other Ambulatory Visit: Payer: Self-pay | Admitting: *Deleted

## 2019-09-29 NOTE — Patient Outreach (Signed)
[facility]) Care Management  09/29/2019  Melinda [name] [birth year] [id number]   Referral received : 09/29/19 Referral source: Notification of inpatient discharge from [facility] on 09/27/19 Insurance : [facility]     Referral received. Transition of care calls being completed via EMMI-automated calls. RN CM will outreach patient for any red flags received.   Case Closure Reason: enrolled in another program,    Egbert Garibaldi, RN, BSN  [facility] Care Management,Care Management Coordinator  [phone]- Mobile [phone]- Toll Free Main Office

## 2019-10-02 ENCOUNTER — Encounter: Payer: Self-pay | Admitting: Internal Medicine

## 2019-10-03 ENCOUNTER — Ambulatory Visit: Payer: Medicare HMO | Admitting: Family

## 2019-10-05 ENCOUNTER — Other Ambulatory Visit: Payer: Self-pay | Admitting: *Deleted

## 2019-10-05 ENCOUNTER — Encounter: Payer: Self-pay | Admitting: Family

## 2019-10-05 ENCOUNTER — Ambulatory Visit (INDEPENDENT_AMBULATORY_CARE_PROVIDER_SITE_OTHER): Payer: Medicare HMO | Admitting: Family

## 2019-10-05 ENCOUNTER — Other Ambulatory Visit: Payer: Self-pay

## 2019-10-05 VITALS — BP 120/60 | HR 78 | Temp 97.8°F | Ht 64.0 in | Wt 221.0 lb

## 2019-10-05 DIAGNOSIS — M797 Fibromyalgia: Secondary | ICD-10-CM

## 2019-10-05 DIAGNOSIS — I10 Essential (primary) hypertension: Secondary | ICD-10-CM | POA: Diagnosis not present

## 2019-10-05 DIAGNOSIS — Z1231 Encounter for screening mammogram for malignant neoplasm of breast: Secondary | ICD-10-CM

## 2019-10-05 DIAGNOSIS — G894 Chronic pain syndrome: Secondary | ICD-10-CM | POA: Diagnosis not present

## 2019-10-05 DIAGNOSIS — G63 Polyneuropathy in diseases classified elsewhere: Secondary | ICD-10-CM | POA: Diagnosis not present

## 2019-10-05 DIAGNOSIS — G43009 Migraine without aura, not intractable, without status migrainosus: Secondary | ICD-10-CM

## 2019-10-05 DIAGNOSIS — I110 Hypertensive heart disease with heart failure: Secondary | ICD-10-CM | POA: Diagnosis not present

## 2019-10-05 DIAGNOSIS — M48062 Spinal stenosis, lumbar region with neurogenic claudication: Secondary | ICD-10-CM

## 2019-10-05 DIAGNOSIS — J302 Other seasonal allergic rhinitis: Secondary | ICD-10-CM | POA: Diagnosis not present

## 2019-10-05 DIAGNOSIS — I5032 Chronic diastolic (congestive) heart failure: Secondary | ICD-10-CM | POA: Diagnosis not present

## 2019-10-05 DIAGNOSIS — F332 Major depressive disorder, recurrent severe without psychotic features: Secondary | ICD-10-CM | POA: Diagnosis not present

## 2019-10-05 DIAGNOSIS — Z1211 Encounter for screening for malignant neoplasm of colon: Secondary | ICD-10-CM

## 2019-10-05 DIAGNOSIS — H04123 Dry eye syndrome of bilateral lacrimal glands: Secondary | ICD-10-CM | POA: Insufficient documentation

## 2019-10-05 DIAGNOSIS — E782 Mixed hyperlipidemia: Secondary | ICD-10-CM | POA: Insufficient documentation

## 2019-10-05 LAB — CBC WITH DIFFERENTIAL/PLATELET
Absolute Monocytes: 1147 cells/uL — ABNORMAL HIGH (ref 200–950)
Basophils Absolute: 69 cells/uL (ref 0–200)
Basophils Relative: 1.4 %
Eosinophils Absolute: 250 cells/uL (ref 15–500)
Eosinophils Relative: 5.1 %
HCT: 37 % (ref 35.0–45.0)
Hemoglobin: 12.1 g/dL (ref 11.7–15.5)
Lymphs Abs: 1485 cells/uL (ref 850–3900)
MCH: 27.8 pg (ref 27.0–33.0)
MCHC: 32.7 g/dL (ref 32.0–36.0)
MCV: 85.1 fL (ref 80.0–100.0)
MPV: 11.5 fL (ref 7.5–12.5)
Monocytes Relative: 23.4 %
Neutro Abs: 1950 cells/uL (ref 1500–7800)
Neutrophils Relative %: 39.8 %
Platelets: 386 10*3/uL (ref 140–400)
RBC: 4.35 10*6/uL (ref 3.80–5.10)
RDW: 12.8 % (ref 11.0–15.0)
Total Lymphocyte: 30.3 %
WBC: 4.9 10*3/uL (ref 3.8–10.8)

## 2019-10-05 LAB — COMPLETE METABOLIC PANEL WITH GFR
AG Ratio: 1.5 (calc) (ref 1.0–2.5)
ALT: 19 U/L (ref 6–29)
AST: 15 U/L (ref 10–35)
Albumin: 4.2 g/dL (ref 3.6–5.1)
Alkaline phosphatase (APISO): 66 U/L (ref 37–153)
BUN/Creatinine Ratio: 6 (calc) (ref 6–22)
BUN: 7 mg/dL (ref 7–25)
CO2: 29 mmol/L (ref 20–32)
Calcium: 9.2 mg/dL (ref 8.6–10.4)
Chloride: 105 mmol/L (ref 98–110)
Creat: 1.24 mg/dL — ABNORMAL HIGH (ref 0.50–0.99)
GFR, Est African American: 52 mL/min/{1.73_m2} — ABNORMAL LOW (ref >=60)
GFR, Est Non African American: 45 mL/min/{1.73_m2} — ABNORMAL LOW (ref >=60)
Globulin: 2.8 g/dL (calc) (ref 1.9–3.7)
Glucose, Bld: 70 mg/dL (ref 65–99)
Potassium: 3.5 mmol/L (ref 3.5–5.3)
Sodium: 142 mmol/L (ref 135–146)
Total Bilirubin: 0.7 mg/dL (ref 0.2–1.2)
Total Protein: 7 g/dL (ref 6.1–8.1)

## 2019-10-05 LAB — LIPID PANEL
Cholesterol: 194 mg/dL (ref <200)
HDL: 46 mg/dL — ABNORMAL LOW (ref >=50)
LDL Cholesterol (Calc): 127 mg/dL (calc) — ABNORMAL HIGH
Non-HDL Cholesterol (Calc): 148 mg/dL (calc) — ABNORMAL HIGH (ref <130)
Total CHOL/HDL Ratio: 4.2 (calc) (ref <5.0)
Triglycerides: 107 mg/dL (ref <150)

## 2019-10-05 LAB — TSH: TSH: 1.58 mIU/L (ref 0.40–4.50)

## 2019-10-05 MED ORDER — OXYCODONE HCL 15 MG PO TABS: 15.0000 mg | ORAL_TABLET | Freq: Three times a day (TID) | ORAL | 0 refills | Status: DC | PRN

## 2019-10-05 MED ORDER — CITALOPRAM HYDROBROMIDE 20 MG PO TABS: 20.0000 mg | ORAL_TABLET | Freq: Every day | ORAL | 1 refills | Status: DC

## 2019-10-05 MED ORDER — GABAPENTIN 400 MG PO CAPS: 800.0000 mg | ORAL_CAPSULE | Freq: Two times a day (BID) | ORAL | 0 refills | Status: DC

## 2019-10-05 NOTE — Telephone Encounter (Signed)
Patient called and stated that you were going to send her Oxycodone Rx to [facility].  Pended Rx and sent to [facility] for approval.   [place]CCSRS Database Verified LR: 10/03/2019 #12 by Dr. Lyn Hollingshead.

## 2019-10-05 NOTE — Progress Notes (Signed)
Provider: Marlowe Sax FNP-C  Amour Trigg, Nelda Bucks, NP  Patient Care Team: Callie Bunyard, Nelda Bucks, NP as PCP - General (Family Medicine)  Extended Emergency Contact Information Primary Emergency Contact: Amparo [place] Mobile Phone: [phone] Relation: Daughter Secondary Emergency Contact: Dutch,Lashawn Mobile Phone: [phone] Relation: Daughter  Code Status:  Full code  Goals of care: Advanced Directive information Advanced Directives 09/21/2019  Does Patient Have a Medical Advance Directive? Yes  Type of Advance Directive (No Data)  Does patient want to make changes to medical advance directive? No - Patient declined  Copy of [place] in Chart? -  Would patient like information on creating a medical advance directive? -     Chief Complaint  Patient presents with  . Follow-up    discharged from [facility]     HPI:  Pt is a 69 y.o. female seen today for an acute visit for follow up discharge from [place].she has a medical history of Hypertension,spinal Stenosis of lumbar region with neurogenic claudication ,Major depression,Polyneuropathy,migraine headache,seasonal allergies among other conditions. she was seen in the ED 09/12/2019 and 09/20/2019 for falls.she was send to SNF for rehabilitation.she worked with Physical Therapy with much improvement and was discharged home 09/26/2019. She states feeling more stronger since discharge from rehab.she lives by herself in a senior apartment.she continues to walk with a Rolator with a seat.No fall episodes since discharge from rehab. Hypertension - no home blood pressure readings for review.On Metoprolol tartrate 50 mg tablet twice daily and furosemide 40 mg tablet daily.on ASA EC 81 mg tablet daily for prevention. She denies any signs of hypotension.   Polyneuropathy - on Gabapentin 1200 mg capsule twice daily.discussed increase sedation and over dose risk taking  along with her Oxycodone.Agrees to reduce dosage to 800 mg capsule twice daily.states not really helping with her neuropathy.   Migraine Headache - intermittent continues to require Imitrex 25 mg tablet as needed.   Spinal Stenosis of lumbar region - on oxycodone 15 mg IR tablet every 8 hours as needed.   Allergies - state Flonase has been effective and takes loratadine as needed.   Depression - states symptoms stable on Citalopram 20 mg tablet daily and amitriptyline 150 mg tablet at bedtime and amitriptyline 150 mg tablet daily at bedtime.   Hyperlipidemia - LDL 102,Chol 182,TRG 89 ( 03/29/2019 she missed appointment for follow up fasting labs. States fasting today will obtain lab work. States has been able to cook for herself now.  Health maintenance  - she is due for her mammogram screening.states also missed her colonoscopy appointment.  Up to date on immunization.   Past Medical History:  Diagnosis Date  . Acute kidney injury ([place])   . Allergy   . Anxiety   . Arthritis    "99% of my body" (11/11/2016)  . Asthma   . Bursitis of both hips   . Cataract   . Cervical scoliosis   . Chronic back pain    "all over my back" (11/11/2016)  . Depressive disorder, not elsewhere classified   . Enthesopathy of hip region   . Environmental allergies    "I take Claritin qd; 365 days/year" (11/11/2016)  . Essential hypertension, benign   . Fibromyalgia   . Frequent falls   . GERD (gastroesophageal reflux disease)   . Headache    "at least 1/wk; may last for 2 days or so" (11/11/2016)  . History of blood transfusion 1985  w/hysterectomy  . Lumbar stenosis   . Migraine    "a few/month" (11/11/2016)  . Muscle weakness (generalized)   . Myalgia and myositis, unspecified   . Osteoporosis   . Other abnormal blood chemistry   . Other malaise and fatigue   . Raynaud's syndrome   . Sciatic nerve pain, left   . Sciatica   . Unspecified vitamin D deficiency    Past Surgical History:    Procedure Laterality Date  . ABDOMINAL HYSTERECTOMY  1985  . [place]; 1979  . SHOULDER OPEN ROTATOR CUFF REPAIR Left     No Known Allergies  Outpatient Encounter Medications as of 10/05/2019  Medication Sig  . acetaminophen (TYLENOL) 500 MG tablet Take 1,000 mg by mouth every 8 (eight) hours. For Pain  . amitriptyline (ELAVIL) 150 MG tablet Take 1 tablet (150 mg total) by mouth at bedtime.  Marland Kitchen ascorbic acid (VITAMIN C) 500 MG tablet Take 1 tablet (500 mg total) by mouth 2 (two) times daily.  Marland Kitchen aspirin EC 81 MG tablet Take 81 mg by mouth daily.  . Cholecalciferol (VITAMIN D3) 50 MCG (2000 UT) TABS Take 2,000 Units by mouth daily.  . citalopram (CELEXA) 20 MG tablet Take 1 tablet (20 mg total) by mouth daily.  . fluticasone (FLONASE) 50 MCG/ACT nasal spray Place 2 sprays into both nostrils daily as needed for allergies or rhinitis.  . furosemide (LASIX) 40 MG tablet Take 1 tablet (40 mg total) by mouth daily.  Marland Kitchen gabapentin (NEURONTIN) 400 MG capsule Take 3 capsules (1,200 mg total) by mouth 2 (two) times daily. TAKE 3 CAPSULES BY MOUTH TWICE DAILY FOR POLYNEUROPATHY  . loratadine (CLARITIN) 10 MG tablet Take 1 tablet (10 mg total) by mouth daily.  . metoprolol tartrate (LOPRESSOR) 50 MG tablet Take one tablet by mouth twice daily for blood pressure  . oxyCODONE (ROXICODONE) 15 MG immediate release tablet Take 1 tablet (15 mg total) by mouth every 8 (eight) hours as needed for pain.  . SUMAtriptan (IMITREX) 25 MG tablet TAKE 1 TAB EVERY 2 HRS AS NEEDED FOR MIGRAINE OR HEADACHE. MAY REPEAT IN 2 HRS IF PERSISTS/RECURS.  . vitamin B-12 (CYANOCOBALAMIN) 500 MCG tablet Take 1 tablet (500 mcg total) by mouth daily.  . [DISCONTINUED] famotidine (PEPCID) 20 MG tablet Take 1 tablet (20 mg total) by mouth at bedtime.  . [DISCONTINUED] polyethylene glycol (MIRALAX / GLYCOLAX) 17 g packet Take 17 g by mouth daily.  . [DISCONTINUED] zinc sulfate 220 (50 Zn) MG capsule TAKE 1 CAPSULE (220 MG  TOTAL) BY MOUTH DAILY.   No facility-administered encounter medications on file as of 10/05/2019.    Review of Systems  Constitutional: Negative for appetite change, chills, fatigue and fever.  HENT: Negative for rhinorrhea, sinus pressure, sinus pain, sneezing, sore throat and trouble swallowing.        Flonase effective   Eyes: Positive for visual disturbance. Negative for discharge, redness and itching.       Wears eye glasses   Respiratory: Negative for cough, chest tightness, shortness of breath and wheezing.   Cardiovascular: Negative for chest pain, palpitations and leg swelling.  Gastrointestinal: Negative for abdominal distention, abdominal pain, constipation, diarrhea, nausea and vomiting.  Endocrine: Negative for cold intolerance, heat intolerance, polydipsia, polyphagia and polyuria.  Genitourinary: Negative for difficulty urinating, dysuria, flank pain, frequency, urgency, vaginal bleeding and vaginal discharge.  Musculoskeletal: Positive for arthralgias, back pain and gait problem. Negative for neck pain and neck stiffness.  Skin: Negative for  color change, pallor and rash.  Neurological: Negative for dizziness, speech difficulty, weakness, light-headedness and numbness.       Neuropathy  Intermittent migraine imtrex effective   Hematological: Does not bruise/bleed easily.  Psychiatric/Behavioral: Negative for agitation and sleep disturbance. The patient is not nervous/anxious.        Citalopram helping with depression     Immunization History  Administered Date(s) Administered  . Fluad Quad(high Dose 65+) 03/29/2019  . Influenza,inj,Quad PF,6+ Mos 05/23/2015, 05/28/2016, 03/24/2018  . Influenza-Unspecified 07/07/2012, 02/28/2014  . PFIZER SARS-COV-2 Vaccination 10/03/2019  . Pneumococcal Conjugate-13 07/14/2014  . Pneumococcal Polysaccharide-23 09/11/2017  . Tdap 01/26/2016   Pertinent  Health Maintenance Due  Topic Date Due  . COLONOSCOPY  Never done  . MAMMOGRAM   09/04/2019  . INFLUENZA VACCINE  01/29/2020  . DEXA SCAN  Completed  . PNA vac Low Risk Adult  Completed   Fall Risk  10/05/2019 07/20/2019 07/04/2019 03/29/2019 12/03/2018  Falls in the past year? 1 1 1 1 1   Number falls in past yr: 1 1 1 1 1   Comment - - - - -  Injury with Fall? 1 1 1  0 0  Comment - - - - -  Risk Factor Category  - - - - -  Risk for fall due to : - - - - -  Follow up - - - - -     Vitals:   10/05/19 0849  BP: 120/60  Pulse: 78  Temp: 97.8 F (36.6 C)  TempSrc: Oral  SpO2: 96%  Weight: 221 lb (100.2 kg)  Height: 5' 4"  (1.626 m)   Body mass index is 37.93 kg/m. Physical Exam Vitals reviewed.  Constitutional:      General: She is not in acute distress.    Appearance: She is obese. She is not ill-appearing.  HENT:     Head: Normocephalic.     Right Ear: Tympanic membrane, ear canal and external ear normal. There is no impacted cerumen.     Left Ear: Tympanic membrane, ear canal and external ear normal. There is no impacted cerumen.     Nose: No nasal tenderness, mucosal edema, congestion or rhinorrhea.     Right Turbinates: Not enlarged or swollen.     Left Turbinates: Enlarged and swollen.     Right Sinus: No maxillary sinus tenderness or frontal sinus tenderness.     Left Sinus: No maxillary sinus tenderness or frontal sinus tenderness.     Mouth/Throat:     Mouth: Mucous membranes are moist.     Pharynx: Oropharynx is clear. No oropharyngeal exudate or posterior oropharyngeal erythema.  Eyes:     General: No scleral icterus.       Right eye: No discharge.        Left eye: No discharge.     Extraocular Movements: Extraocular movements intact.     Conjunctiva/sclera: Conjunctivae normal.     Pupils: Pupils are equal, round, and reactive to light.  Neck:     Vascular: No carotid bruit.  Cardiovascular:     Rate and Rhythm: Normal rate and regular rhythm.     Pulses: Normal pulses.     Heart sounds: Normal heart sounds. No murmur. No friction rub. No  gallop.   Pulmonary:     Effort: Pulmonary effort is normal. No respiratory distress.     Breath sounds: Normal breath sounds. No wheezing, rhonchi or rales.  Chest:     Chest wall: No tenderness.  Abdominal:  General: Bowel sounds are normal. There is no distension.     Palpations: Abdomen is soft. There is no mass.     Tenderness: There is no abdominal tenderness. There is no right CVA tenderness, left CVA tenderness, guarding or rebound.  Musculoskeletal:        General: No swelling or tenderness.     Cervical back: Normal range of motion. No rigidity or tenderness.     Right lower leg: No edema.     Left lower leg: No edema.     Comments: Unsteady gait ambulates with Rolator   Lymphadenopathy:     Cervical: No cervical adenopathy.  Skin:    General: Skin is warm and dry.     Coloration: Skin is not pale.     Findings: No bruising, erythema or rash.  Neurological:     Mental Status: She is alert and oriented to person, place, and time.     Cranial Nerves: No cranial nerve deficit.     Motor: No weakness.     Gait: Gait abnormal.  Psychiatric:        Mood and Affect: Mood normal.        Behavior: Behavior normal.        Thought Content: Thought content normal.        Judgment: Judgment normal.     Labs reviewed: Recent Labs    10/10/18 0402 10/10/18 0402 10/11/18 0344 10/11/18 0344 10/12/18 0402 10/28/18 0000 05/13/19 0429 05/13/19 0429 07/15/19 1805 07/15/19 1805 09/12/19 1240 09/19/19 0000 09/22/19 0000  NA 138   < > 135   < > 136   < > 137   < > 138   < > 142 140 140  K 4.0   < > 3.6   < > 4.1   < > 3.9   < > 3.8   < > 3.6 4.0 4.3  CL 98   < > 96*   < > 99   < > 101   < > 101   < > 102 100 102  CO2 26   < > 25   < > 24   < > 28   < > 26   < > 27 25* 25*  GLUCOSE 89   < > 90   < > 87   < > 95  --  81  --  104*  --   --   BUN 14   < > 16   < > 13   < > 20   < > 18   < > 15 15 14   CREATININE 0.72   < > 0.87   < > 0.89   < > 1.55*   < > 1.49*   < > 1.70*  1.1 1.2*  CALCIUM 9.1   < > 8.9   < > 9.0   < > 8.8*   < > 9.0   < > 9.6 9.4 9.2  MG 2.2  --  2.2  --  2.1  --   --   --   --   --   --   --   --    < > = values in this interval not displayed.   Recent Labs    10/11/18 0344 10/11/18 0344 10/28/18 0000 10/28/18 0000 10/28/18 1330 03/29/19 1011 09/12/19 1240  AST 19   < > 11*   < > 11 11 17   ALT 14   < > 9   < >  9 6 16   ALKPHOS 71   < > 61  --  61  --  71  BILITOT 0.7  --   --   --  0.6 0.8 0.6  PROT 7.5  --   --   --  7.0 6.8 7.6  ALBUMIN 3.2*  --   --   --  4.0  --  4.2   < > = values in this interval not displayed.   Recent Labs    03/29/19 1011 05/09/19 0328 05/10/19 0411 05/10/19 0411 07/15/19 1805 07/15/19 1805 09/12/19 1240 09/19/19 0000 09/22/19 0000  WBC 5.7   < > 6.1   < > 6.0   < > 7.7 6.2 5.7  NEUTROABS 2,491  --   --   --   --   --   --  2 2  HGB 11.3*   < > 11.3*   < > 11.9*   < > 11.3* 11.0* 11.0*  HCT 34.7*   < > 35.5*   < > 38.6   < > 36.9 34* 34*  MCV 85.3   < > 88.3  --  90.4  --  89.3  --   --   PLT 334   < > 354   < > 331   < > 337 299 315   < > = values in this interval not displayed.   Lab Results  Component Value Date   TSH 1.346 05/09/2019   Lab Results  Component Value Date   HGBA1C 5.3 03/29/2019   Lab Results  Component Value Date   CHOL 182 03/29/2019   HDL 62 03/29/2019   LDLCALC 102 (H) 03/29/2019   TRIG 89 03/29/2019   CHOLHDL 2.9 03/29/2019    Significant Diagnostic Results in last 30 days:  DG HIP UNILAT W OR W/O PELVIS 2-3 VIEWS RIGHT  Result Date: 09/12/2019 CLINICAL DATA:  Status post fall, pain EXAM: DG HIP (WITH OR WITHOUT PELVIS) 2-3V RIGHT COMPARISON:  None. FINDINGS: There is no evidence of hip fracture or dislocation. There is no evidence of arthropathy or other focal bone abnormality. Subchondral sclerosis around the pubic symphysis as can be seen with osteitis pubis. IMPRESSION: No acute osseous injury of the right hip. Electronically Signed   By: Kathreen Devoid    On: 09/12/2019 13:12    Assessment/Plan 1. Essential hypertension B/p at goal.continue on Metoprolol tartrate 50 mg tablet twice daily and furosemide 40 mg tablet daily.on ASA EC 81 mg tablet daily for CV event prevention. - CBC with Differential/Platelet - CMP with eGFR([facility]) - TSH  2. Spinal stenosis of lumbar region with neurogenic claudication - Continue on Oxycodone 15 mg IR tablet every 8 hours as needed.Unable to wean off this visit. - Narcotic contract up to date.  - Urine drug test today  - gabapentin (NEURONTIN) 400 MG capsule; Take 2 capsules (800 mg total) by mouth 2 (two) times daily. POLYNEUROPATHY  Dispense: 180 capsule; Refill: 0  3. Polyneuropathy associated with underlying disease (HCC) Reduce Gabapentin from 1200 mg capsule twice daily to 800 mg capsule twice daily.will further reduce dose if tolerated.  - gabapentin (NEURONTIN) 400 MG capsule; Take 2 capsules (800 mg total) by mouth 2 (two) times daily. POLYNEUROPATHY  Dispense: 180 capsule; Refill: 0  4. Migraine without aura and without status migrainosus, not intractable Continue Imitrex 25 mg tablet as needed.  5. Seasonal allergic rhinitis, unspecified trigger Left nares  turbinates erythematous,swollen and enlarge.Advised to use Flonase 50 mcg nasal  spray 2 sprays to each nares daily. - Advised to take loratadine 10 mg tablet daily.    6. Fibromyalgia Continue on current pain regimen.Narcotic risk overdose discussed verbalized understanding.  7. Chronic diastolic congestive heart failure (HCC) No signs of fluid overload.continue on Furosemide 40 mg tablet daily. Avoid adding extra salt to diet.   8. Mixed hyperlipidemia Latest LDL not at goal.Advised on low carbohydrates,low saturated fats and high vegetable diet  - Lipid Panel  9. Chronic pain syndrome Continue current pain regimen as above.  - Urine Drug Screen w/Alc, no confirm([facility])  10. Severe episode of recurrent major depressive disorder,  without psychotic features ([place]) Mood stable.continue on Citalopram 20 mg tablet daily and amitriptyline 150 mg tablet at bedtime. And amitriptyline 150 mg tablet at bedtime.  - citalopram (CELEXA) 20 MG tablet; Take 1 tablet (20 mg total) by mouth daily.  Dispense: 90 tablet; Refill: 1  11. Breast cancer screening by mammogram Reports no breast symptoms. - MM Digital Screening; Future  12. Colon cancer screening Reports no signs of rectal bleeding.  - Ambulatory referral to Gastroenterology  13. Dry eyes, bilateral Refresh eye drops 2 drops twice daily effective as needed.   . Family/ staff Communication: Reviewed plan of care with patient verbalized understanding.   Labs/tests ordered:  - CBC with Differential/Platelet - CMP with eGFR([facility]) - TSH - Lipid Panel  Next Appointment: 6 months for medical management of chronic issues.   Sandrea Hughs, NP

## 2019-10-05 NOTE — Patient Instructions (Signed)
-   Reduce Gabapentin 400 mg Capsule to 2 capsule by mouth twice daily for Neuropathy  - Labs done today will call you with results.

## 2019-10-07 ENCOUNTER — Telehealth: Payer: Self-pay | Admitting: *Deleted

## 2019-10-07 ENCOUNTER — Encounter: Payer: Self-pay | Admitting: *Deleted

## 2019-10-07 DIAGNOSIS — G63 Polyneuropathy in diseases classified elsewhere: Secondary | ICD-10-CM

## 2019-10-07 DIAGNOSIS — I10 Essential (primary) hypertension: Secondary | ICD-10-CM

## 2019-10-07 DIAGNOSIS — I5032 Chronic diastolic (congestive) heart failure: Secondary | ICD-10-CM

## 2019-10-07 DIAGNOSIS — E782 Mixed hyperlipidemia: Secondary | ICD-10-CM

## 2019-10-07 LAB — PAIN MGMT, PROFILE 1 CONF W/O MM, U
Amphetamines: NEGATIVE ng/mL
Barbiturates: NEGATIVE ng/mL
Benzodiazepines: NEGATIVE ng/mL
Cocaine Metabolite: NEGATIVE ng/mL
Codeine: NEGATIVE ng/mL
Creatinine: 238.4 mg/dL
Hydrocodone: NEGATIVE ng/mL
Hydromorphone: NEGATIVE ng/mL
Marijuana Metabolite: NEGATIVE ng/mL
Methadone Metabolite: NEGATIVE ng/mL
Morphine: NEGATIVE ng/mL
Norhydrocodone: NEGATIVE ng/mL
Noroxycodone: 6659 ng/mL
Opiates: NEGATIVE ng/mL
Oxidant: NEGATIVE ug/mL
Oxycodone: 1479 ng/mL
Oxycodone: POSITIVE ng/mL
Oxymorphone: 1402 ng/mL
Phencyclidine: NEGATIVE ng/mL
pH: 5.3 (ref 4.5–9.0)

## 2019-10-07 NOTE — Telephone Encounter (Signed)
Lab appt made and lab order entered letter mailed to patients home address with results.

## 2019-10-07 NOTE — Telephone Encounter (Signed)
-----   Message from Caesar Bookman, NP sent at 10/06/2019  2:41 PM EDT ----- CBC indicates no anemia or infection.Electrolytes and liver function are normal.renal function high but at baseline.Thyroid function is normal. LDL is higher than previous level recommend low carbohydrates,low saturated fats and high vegetable diet.will recheck lab work  CBc/diff,CMP,TSH,Lipid panel,Vitamin D and vitamin B12 levels 2 -4 days prior to your next visit in 04/05/2020.

## 2019-10-10 ENCOUNTER — Other Ambulatory Visit: Payer: Self-pay | Admitting: Internal Medicine

## 2019-10-10 DIAGNOSIS — I10 Essential (primary) hypertension: Secondary | ICD-10-CM

## 2019-10-10 MED ORDER — METOPROLOL TARTRATE 50 MG PO TABS: ORAL_TABLET | ORAL | 1 refills | Status: DC

## 2019-10-10 NOTE — Addendum Note (Signed)
Addended by: Maurice Small on: 10/10/2019 03:30 PM   Modules accepted: Orders

## 2019-10-25 ENCOUNTER — Other Ambulatory Visit: Payer: Self-pay | Admitting: Internal Medicine

## 2019-10-25 DIAGNOSIS — J302 Other seasonal allergic rhinitis: Secondary | ICD-10-CM

## 2019-10-28 ENCOUNTER — Other Ambulatory Visit: Payer: Self-pay | Admitting: *Deleted

## 2019-10-28 NOTE — Telephone Encounter (Signed)
Too sooner for refills.

## 2019-10-28 NOTE — Telephone Encounter (Signed)
Patient called requesting refill on her Oxycodone.  [place] Verified and LR: 10/06/19 for #90 filled at [facility].  Patient is not due for refill until 11/04/19 (30 days) Patient stated that was not correct. Stated that pharmacy did not give her 90 tablets.  I instructed her to reach out to her pharmacy because it shows in the database that #90 was given on 10/06/2019 (30 day supply).   She stated that she will check her bottle and call the pharmacy. Agreed.

## 2019-11-01 ENCOUNTER — Inpatient Hospital Stay (HOSPITAL_COMMUNITY)
Admission: EM | Admit: 2019-11-01 | Discharge: 2019-11-05 | DRG: 516 | Disposition: A | Discharge status: Home Health | Payer: Medicare HMO | Attending: Internal Medicine | Admitting: Internal Medicine

## 2019-11-01 ENCOUNTER — Emergency Department (HOSPITAL_COMMUNITY): Payer: Medicare HMO

## 2019-11-01 ENCOUNTER — Other Ambulatory Visit: Payer: Self-pay

## 2019-11-01 ENCOUNTER — Encounter (HOSPITAL_COMMUNITY): Payer: Self-pay | Admitting: Emergency Medicine

## 2019-11-01 ENCOUNTER — Ambulatory Visit (HOSPITAL_BASED_OUTPATIENT_CLINIC_OR_DEPARTMENT_OTHER)
Admission: RE | Admit: 2019-11-01 | Discharge: 2019-11-01 | Disposition: A | Discharge status: Home / Self Care | Payer: Medicare HMO | Source: Ambulatory Visit | Attending: Emergency Medicine | Admitting: Emergency Medicine

## 2019-11-01 DIAGNOSIS — Z6833 Body mass index (BMI) 33.0-33.9, adult: Secondary | ICD-10-CM

## 2019-11-01 DIAGNOSIS — M545 Low back pain: Secondary | ICD-10-CM | POA: Diagnosis not present

## 2019-11-01 DIAGNOSIS — S32018A Other fracture of first lumbar vertebra, initial encounter for closed fracture: Principal | ICD-10-CM | POA: Diagnosis present

## 2019-11-01 DIAGNOSIS — G8929 Other chronic pain: Secondary | ICD-10-CM | POA: Diagnosis present

## 2019-11-01 DIAGNOSIS — G4710 Hypersomnia, unspecified: Secondary | ICD-10-CM | POA: Diagnosis present

## 2019-11-01 DIAGNOSIS — Z8616 Personal history of COVID-19: Secondary | ICD-10-CM | POA: Diagnosis not present

## 2019-11-01 DIAGNOSIS — R531 Weakness: Secondary | ICD-10-CM

## 2019-11-01 DIAGNOSIS — N1832 Chronic kidney disease, stage 3b: Secondary | ICD-10-CM | POA: Diagnosis present

## 2019-11-01 DIAGNOSIS — K219 Gastro-esophageal reflux disease without esophagitis: Secondary | ICD-10-CM | POA: Diagnosis present

## 2019-11-01 DIAGNOSIS — M6281 Muscle weakness (generalized): Secondary | ICD-10-CM | POA: Diagnosis not present

## 2019-11-01 DIAGNOSIS — W1830XA Fall on same level, unspecified, initial encounter: Secondary | ICD-10-CM | POA: Diagnosis present

## 2019-11-01 DIAGNOSIS — N179 Acute kidney failure, unspecified: Secondary | ICD-10-CM | POA: Diagnosis present

## 2019-11-01 DIAGNOSIS — M549 Dorsalgia, unspecified: Secondary | ICD-10-CM

## 2019-11-01 DIAGNOSIS — G63 Polyneuropathy in diseases classified elsewhere: Secondary | ICD-10-CM

## 2019-11-01 DIAGNOSIS — Z23 Encounter for immunization: Secondary | ICD-10-CM | POA: Diagnosis present

## 2019-11-01 DIAGNOSIS — F329 Major depressive disorder, single episode, unspecified: Secondary | ICD-10-CM | POA: Diagnosis present

## 2019-11-01 DIAGNOSIS — W19XXXA Unspecified fall, initial encounter: Secondary | ICD-10-CM

## 2019-11-01 DIAGNOSIS — M7989 Other specified soft tissue disorders: Secondary | ICD-10-CM | POA: Diagnosis not present

## 2019-11-01 DIAGNOSIS — I129 Hypertensive chronic kidney disease with stage 1 through stage 4 chronic kidney disease, or unspecified chronic kidney disease: Secondary | ICD-10-CM | POA: Diagnosis present

## 2019-11-01 DIAGNOSIS — Z03818 Encounter for observation for suspected exposure to other biological agents ruled out: Secondary | ICD-10-CM | POA: Diagnosis not present

## 2019-11-01 DIAGNOSIS — I10 Essential (primary) hypertension: Secondary | ICD-10-CM | POA: Diagnosis present

## 2019-11-01 DIAGNOSIS — Z90710 Acquired absence of both cervix and uterus: Secondary | ICD-10-CM | POA: Diagnosis not present

## 2019-11-01 DIAGNOSIS — Z79899 Other long term (current) drug therapy: Secondary | ICD-10-CM | POA: Diagnosis not present

## 2019-11-01 DIAGNOSIS — E669 Obesity, unspecified: Secondary | ICD-10-CM | POA: Diagnosis present

## 2019-11-01 DIAGNOSIS — Z7982 Long term (current) use of aspirin: Secondary | ICD-10-CM

## 2019-11-01 DIAGNOSIS — M4856XA Collapsed vertebra, not elsewhere classified, lumbar region, initial encounter for fracture: Secondary | ICD-10-CM | POA: Diagnosis not present

## 2019-11-01 DIAGNOSIS — M5440 Lumbago with sciatica, unspecified side: Secondary | ICD-10-CM

## 2019-11-01 DIAGNOSIS — Y92009 Unspecified place in unspecified non-institutional (private) residence as the place of occurrence of the external cause: Secondary | ICD-10-CM | POA: Diagnosis not present

## 2019-11-01 DIAGNOSIS — R69 Illness, unspecified: Secondary | ICD-10-CM | POA: Diagnosis not present

## 2019-11-01 DIAGNOSIS — F419 Anxiety disorder, unspecified: Secondary | ICD-10-CM | POA: Diagnosis present

## 2019-11-01 DIAGNOSIS — E876 Hypokalemia: Secondary | ICD-10-CM | POA: Diagnosis present

## 2019-11-01 DIAGNOSIS — N289 Disorder of kidney and ureter, unspecified: Secondary | ICD-10-CM | POA: Diagnosis not present

## 2019-11-01 DIAGNOSIS — M797 Fibromyalgia: Secondary | ICD-10-CM | POA: Diagnosis present

## 2019-11-01 DIAGNOSIS — F32A Depression, unspecified: Secondary | ICD-10-CM

## 2019-11-01 DIAGNOSIS — R296 Repeated falls: Secondary | ICD-10-CM

## 2019-11-01 DIAGNOSIS — N189 Chronic kidney disease, unspecified: Secondary | ICD-10-CM

## 2019-11-01 DIAGNOSIS — R519 Headache, unspecified: Secondary | ICD-10-CM | POA: Diagnosis not present

## 2019-11-01 DIAGNOSIS — S0990XA Unspecified injury of head, initial encounter: Secondary | ICD-10-CM | POA: Diagnosis not present

## 2019-11-01 DIAGNOSIS — S3992XA Unspecified injury of lower back, initial encounter: Secondary | ICD-10-CM | POA: Diagnosis not present

## 2019-11-01 DIAGNOSIS — M48062 Spinal stenosis, lumbar region with neurogenic claudication: Secondary | ICD-10-CM

## 2019-11-01 DIAGNOSIS — S32010A Wedge compression fracture of first lumbar vertebra, initial encounter for closed fracture: Secondary | ICD-10-CM | POA: Diagnosis not present

## 2019-11-01 DIAGNOSIS — M48061 Spinal stenosis, lumbar region without neurogenic claudication: Secondary | ICD-10-CM

## 2019-11-01 LAB — CBC WITH DIFFERENTIAL/PLATELET
Abs Immature Granulocytes: 0.04 10*3/uL (ref 0.00–0.07)
Basophils Absolute: 0.1 10*3/uL (ref 0.0–0.1)
Basophils Relative: 1 %
Eosinophils Absolute: 0.3 10*3/uL (ref 0.0–0.5)
Eosinophils Relative: 4 %
HCT: 38 % (ref 36.0–46.0)
Hemoglobin: 11.6 g/dL — ABNORMAL LOW (ref 12.0–15.0)
Immature Granulocytes: 1 %
Lymphocytes Relative: 36 %
Lymphs Abs: 2.4 10*3/uL (ref 0.7–4.0)
MCH: 27.7 pg (ref 26.0–34.0)
MCHC: 30.5 g/dL (ref 30.0–36.0)
MCV: 90.7 fL (ref 80.0–100.0)
Monocytes Absolute: 1.1 10*3/uL — ABNORMAL HIGH (ref 0.1–1.0)
Monocytes Relative: 16 %
Neutro Abs: 3 10*3/uL (ref 1.7–7.7)
Neutrophils Relative %: 42 %
Platelets: 311 10*3/uL (ref 150–400)
RBC: 4.19 MIL/uL (ref 3.87–5.11)
RDW: 12.9 % (ref 11.5–15.5)
WBC: 6.9 10*3/uL (ref 4.0–10.5)
nRBC: 0 % (ref 0.0–0.2)

## 2019-11-01 LAB — BASIC METABOLIC PANEL
Anion gap: 12 (ref 5–15)
BUN: 15 mg/dL (ref 8–23)
CO2: 25 mmol/L (ref 22–32)
Calcium: 9 mg/dL (ref 8.9–10.3)
Chloride: 104 mmol/L (ref 98–111)
Creatinine, Ser: 1.43 mg/dL — ABNORMAL HIGH (ref 0.44–1.00)
GFR calc Af Amer: 44 mL/min — ABNORMAL LOW (ref >60)
GFR calc non Af Amer: 38 mL/min — ABNORMAL LOW (ref >60)
Glucose, Bld: 93 mg/dL (ref 70–99)
Potassium: 3.1 mmol/L — ABNORMAL LOW (ref 3.5–5.1)
Sodium: 141 mmol/L (ref 135–145)

## 2019-11-01 LAB — RESPIRATORY PANEL BY RT PCR (FLU A&B, COVID)
Influenza A by PCR: NEGATIVE
Influenza B by PCR: NEGATIVE
SARS Coronavirus 2 by RT PCR: NEGATIVE

## 2019-11-01 MED ORDER — HYDROMORPHONE HCL 1 MG/ML IJ SOLN
1.0000 mg | Freq: Once | INTRAMUSCULAR | Status: AC
  Administered 2019-11-01: 1 mg via INTRAVENOUS
  Filled 2019-11-01: qty 1

## 2019-11-01 MED ORDER — GABAPENTIN 400 MG PO CAPS
1200.0000 mg | ORAL_CAPSULE | Freq: Once | ORAL | Status: AC
  Administered 2019-11-01: 14:00:00 1200 mg via ORAL
  Filled 2019-11-01: qty 3

## 2019-11-01 NOTE — ED Notes (Signed)
Pt unable to void; will continue to monitor. Bedside commode in room.

## 2019-11-01 NOTE — ED Notes (Signed)
Pt unable to void; will continue to monitor.  

## 2019-11-01 NOTE — ED Provider Notes (Signed)
[facility] DEPT Provider Note   CSN: [id number] Arrival date & time: 11/01/19  1018     History Chief Complaint  Patient presents with  . Fatigue  . Fall    Katurah Karapetian is a 69 y.o. female. Presents to ER with concern for a fall, fatigue. Patient reports that she also is having low back pain. Reports chronic low back pain, lumbar stenosis. Seems to be worsening over the last couple weeks. Also feels generally weak, weakness worse in her legs, feels that her legs are giving out on her. Had fall and hit her head. No LOC. Has been taking her home Percocet 10 mg 325 mg tablets with minimal relief. No numbness, bladder or bowel incontinence. Pain is currently 10 out of 10 sharp, shooting, radiating down left leg and right leg, left is worse. Denies any known direct trauma to back.  HPI     Past Medical History:  Diagnosis Date  . Acute kidney injury ([place])   . Allergy   . Anxiety   . Arthritis    "99% of my body" (11/11/2016)  . Asthma   . Bursitis of both hips   . Cataract   . Cervical scoliosis   . Chronic back pain    "all over my back" (11/11/2016)  . Depressive disorder, not elsewhere classified   . Enthesopathy of hip region   . Environmental allergies    "I take Claritin qd; 365 days/year" (11/11/2016)  . Essential hypertension, benign   . Fibromyalgia   . Frequent falls   . GERD (gastroesophageal reflux disease)   . Headache    "at least 1/wk; may last for 2 days or so" (11/11/2016)  . History of blood transfusion 1985   w/hysterectomy  . Lumbar stenosis   . Migraine    "a few/month" (11/11/2016)  . Muscle weakness (generalized)   . Myalgia and myositis, unspecified   . Osteoporosis   . Other abnormal blood chemistry   . Other malaise and fatigue   . Raynaud's syndrome   . Sciatic nerve pain, left   . Sciatica   . Unspecified vitamin D deficiency     Patient Active Problem List   Diagnosis Date Noted  . Closed compression  fracture of first lumbar vertebra ([place]) 11/02/2019  . Anxiety and depression 11/02/2019  . Hypokalemia 11/02/2019  . Dry eyes, bilateral 10/05/2019  . Mixed hyperlipidemia 10/05/2019  . Lower extremity weakness 09/18/2019  . Migraine headache 09/18/2019  . Acute kidney injury ([place]) 05/10/2019  . Near syncope 05/09/2019  . Fall at home, initial encounter 05/09/2019  . History of 2019 novel coronavirus disease (COVID-19) 05/09/2019  . Anemia of chronic disease 05/09/2019  . Acute on chronic renal insufficiency 05/09/2019  . COVID-19 virus infection 10/12/2018  . CAP (community acquired pneumonia) 10/06/2018  . Polyneuropathy in diseases classified elsewhere ([place]) 10/04/2018  . Prediabetes 09/12/2017  . High risk medication use 09/12/2017  . Frequent falls 09/12/2017  . Chronic pain syndrome 08/14/2017  . Gastroesophageal reflux disease 08/14/2017  . Cough variant asthma 03/14/2017  . Pulmonary hypertension ([place]) 03/12/2017  . Right-sided chest pain 11/11/2016  . Asthma 11/11/2016  . Atypical chest pain   . Costochondritis   . Chronic congestive heart failure ([place])   . MDD (major depressive disorder), recurrent episode ([employer]) 05/23/2015  . Allergic rhinitis 11/03/2014  . Paresthesia 09/08/2014  . Peripheral neuropathy 07/14/2014  . Morbid obesity due to excess calories ([place]) 07/14/2014  . Pain in limb 07/13/2014  .  Abnormality of gait 07/13/2014  . Fibromyalgia 06/13/2014  . Spinal stenosis 01/27/2013  . HTN (hypertension) 11/30/2012    Past Surgical History:  Procedure Laterality Date  . ABDOMINAL HYSTERECTOMY  1985  . [employer]; 1979  . SHOULDER OPEN ROTATOR CUFF REPAIR Left      OB History   No obstetric history on file.     Family History  Problem Relation Age of Onset  . Dementia Mother   . Heart disease Mother   . Hypertension Mother   . Diabetes Mother   . Diabetes Sister   . Cancer Sister        lung  . Cancer Other        Nepher (Mothers  side)  . Diabetes Maternal Grandmother   . Arthritis Maternal Grandmother   . Diabetes Cousin        Mother's side  . Arthritis Sister   . Arthritis Cousin        Mother's side   . Colon cancer Neg Hx   . Esophageal cancer Neg Hx   . Liver cancer Neg Hx   . Pancreatic cancer Neg Hx   . Rectal cancer Neg Hx   . Stomach cancer Neg Hx     Social History   Tobacco Use  . Smoking status: Never Smoker  . Smokeless tobacco: Never Used  Substance Use Topics  . Alcohol use: No    Alcohol/week: 0.0 standard drinks  . Drug use: No    Home Medications Prior to Admission medications   Medication Sig Start Date End Date Taking? Authorizing Provider  acetaminophen (TYLENOL) 500 MG tablet Take 1,000 mg by mouth every 8 (eight) hours. For Pain   Yes [provider]  amitriptyline (ELAVIL) 150 MG tablet Take 1 tablet (150 mg total) by mouth at bedtime. 09/26/19  Yes Hennie Duos, MD  ascorbic acid (VITAMIN C) 500 MG tablet Take 1 tablet (500 mg total) by mouth 2 (two) times daily. 09/26/19  Yes Hennie Duos, MD  aspirin EC 81 MG tablet Take 81 mg by mouth daily.   Yes [provider]  carboxymethylcellulose (REFRESH PLUS) 0.5 % SOLN Place 2 drops into both eyes 2 (two) times daily as needed (dry eyes).    Yes [provider]  citalopram (CELEXA) 20 MG tablet Take 1 tablet (20 mg total) by mouth daily. 10/05/19  Yes [name], [name] C, NP  [employer] D3 50 MCG (2000 UT) CAPS TAKE 1 CAPSULE BY MOUTH EVERY DAY 10/25/19  Yes [name], [name] C, NP  fluticasone (FLONASE) 50 MCG/ACT nasal spray PLACE 2 SPRAYS INTO BOTH NOSTRILS DAILY AS NEEDED FOR ALLERGIES OR RHINITIS. 10/25/19  Yes [name], [name] C, NP  furosemide (LASIX) 40 MG tablet Take 1 tablet (40 mg total) by mouth daily. 09/26/19  Yes Hennie Duos, MD  gabapentin (NEURONTIN) 400 MG capsule Take 2 capsules (800 mg total) by mouth 2 (two) times daily. POLYNEUROPATHY 10/05/19  Yes [name], [name] C, NP  loratadine  (CLARITIN) 10 MG tablet TAKE 1 TABLET BY MOUTH EVERY DAY 10/25/19  Yes [name], [name] C, NP  metoprolol tartrate (LOPRESSOR) 50 MG tablet TAKE ONE TABLET BY MOUTH TWICE DAILY FOR BLOOD PRESSURE 10/10/19  Yes [name], [name] C, NP  oxyCODONE (ROXICODONE) 15 MG immediate release tablet Take 1 tablet (15 mg total) by mouth every 8 (eight) hours as needed for pain. 10/05/19  Yes [name], [name] C, NP  SUMAtriptan (IMITREX) 25 MG tablet TAKE 1 TAB EVERY  2 HRS AS NEEDED FOR MIGRAINE OR HEADACHE. MAY REPEAT IN 2 HRS IF PERSISTS/RECURS. 09/26/19  Yes Hennie Duos, MD  vitamin B-12 (CYANOCOBALAMIN) 500 MCG tablet Take 1 tablet (500 mcg total) by mouth daily. 09/26/19  Yes Hennie Duos, MD    Allergies    Patient has no known allergies.  Review of Systems   Review of Systems  Constitutional: Positive for fatigue. Negative for chills and fever.  HENT: Negative for ear pain and sore throat.   Eyes: Negative for pain and visual disturbance.  Respiratory: Negative for cough and shortness of breath.   Cardiovascular: Negative for chest pain and palpitations.  Gastrointestinal: Negative for abdominal pain and vomiting.  Genitourinary: Negative for dysuria and hematuria.  Musculoskeletal: Positive for back pain. Negative for arthralgias.  Skin: Negative for color change and rash.  Neurological: Negative for seizures and syncope.  All other systems reviewed and are negative.   Physical Exam Updated Vital Signs BP 107/65 (BP Location: Right Arm)   Pulse 79   Temp 97.9 F (36.6 C) (Oral)   Resp 14   Ht '5\' 4"'$  (1.626 m)   Wt 88.5 kg   SpO2 100%   BMI 33.47 kg/m   Physical Exam Vitals and nursing note reviewed.  Constitutional:      General: She is not in acute distress.    Appearance: She is well-developed.  HENT:     Head: Normocephalic and atraumatic.  Eyes:     Conjunctiva/sclera: Conjunctivae normal.  Cardiovascular:     Rate and Rhythm: Normal rate and regular rhythm.     Heart  sounds: No murmur.  Pulmonary:     Effort: Pulmonary effort is normal. No respiratory distress.     Breath sounds: Normal breath sounds.  Abdominal:     Palpations: Abdomen is soft.     Tenderness: There is no abdominal tenderness.  Musculoskeletal:     Cervical back: Neck supple.     Comments: No midline C, T-spine tenderness, there is some tenderness throughout her lumbar spine, no step-off or deformity  Tenderness in bilateral calves, bilateral thighs, no bony deformity or focal bony tenderness in lower extremities  Skin:    General: Skin is warm and dry.  Neurological:     Mental Status: She is alert.     Comments: 5 out of 5 strength in bilateral lower extremities, sensation to light touch intact in lower extremities     ED Results / Procedures / Treatments   Labs (all labs ordered are listed, but only abnormal results are displayed) Labs Reviewed  CBC WITH DIFFERENTIAL/PLATELET - Abnormal; Notable for the following components:      Result Value   Hemoglobin 11.6 (*)    Monocytes Absolute 1.1 (*)    All other components within normal limits  BASIC METABOLIC PANEL - Abnormal; Notable for the following components:   Potassium 3.1 (*)    Creatinine, Ser 1.43 (*)    GFR calc non Af Amer 38 (*)    GFR calc Af Amer 44 (*)    All other components within normal limits  BASIC METABOLIC PANEL - Abnormal; Notable for the following components:   Glucose, Bld 102 (*)    Creatinine, Ser 1.25 (*)    GFR calc non Af Amer 44 (*)    GFR calc Af Amer 51 (*)    All other components within normal limits  CBC - Abnormal; Notable for the following components:   Hemoglobin 11.0 (*)  HCT 35.8 (*)    All other components within normal limits  RESPIRATORY PANEL BY RT PCR (FLU A&B, COVID)  URINALYSIS, ROUTINE W REFLEX MICROSCOPIC    EKG EKG Interpretation  Date/Time:  Tuesday Nov 01 2019 13:49:50 EDT Ventricular Rate:  62 PR Interval:    QRS Duration: 118 QT Interval:  474 QTC  Calculation: 482 R Axis:   23 Text Interpretation: Sinus rhythm Nonspecific intraventricular conduction delay Borderline T abnormalities, anterior leads Confirmed by Madalyn Rob [phone]) on 11/01/2019 2:51:42 PM   Radiology CT Head Wo Contrast  Result Date: 11/01/2019 CLINICAL DATA:  Acute pain due to trauma. Fatigue and frequent falls. EXAM: CT HEAD WITHOUT CONTRAST TECHNIQUE: Contiguous axial images were obtained from the base of the skull through the vertex without intravenous contrast. COMPARISON:  CT head dated 10/10/2017. FINDINGS: Brain: No evidence of acute infarction, hemorrhage, hydrocephalus, extra-axial collection or mass lesion/mass effect. Vascular: No hyperdense vessel or unexpected calcification. Skull: Normal. Negative for fracture or focal lesion. Sinuses/Orbits: No acute finding. Other: None. IMPRESSION: No acute intracranial process. Electronically Signed   By: Constance Holster M.D.   On: 11/01/2019 15:09   MR LUMBAR SPINE WO CONTRAST  Result Date: 11/01/2019 CLINICAL DATA:  Initial evaluation for low back pain, recent falls. EXAM: MRI LUMBAR SPINE WITHOUT CONTRAST TECHNIQUE: Multiplanar, multisequence MR imaging of the lumbar spine was performed. No intravenous contrast was administered. COMPARISON:  Prior MRI from 11/21/2017. FINDINGS: Segmentation: Standard. Lowest well-formed disc space labeled the L5-S1 level. Alignment: Physiologic with preservation of the normal lumbar lordosis. No listhesis. Vertebrae: Subtle linear T1 hypointensity the with mild marrow edema seen extending through the central and right aspect of the superior endplate of T1, suspicious for an acute to subacute compression fracture (series 5, image 9). Superimposed endplate Schmorl's node deformity noted. Relatively mild height loss of up to approximately 30% without bony retropulsion. This is benign/mechanical in appearance. Vertebral body height otherwise maintained without evidence for acute or chronic  fracture. Bone marrow signal intensity within normal limits. No discrete or worrisome osseous lesions. No other abnormal marrow edema. Conus medullaris and cauda equina: Conus extends to the T12-L1 level. Conus and cauda equina appear normal. Paraspinal and other soft tissues: Paraspinous soft tissues within normal limits. Simple exophytic cyst seen extending from the lower pole the left kidney. Visualized visceral structures otherwise unremarkable. Disc levels: T12-L1: Disc desiccation without significant disc bulge. No canal or foraminal stenosis. L1-2: Negative interspace. Mild bilateral facet hypertrophy, greater on the right. No stenosis. L2-3: Disc desiccation without disc bulge. Mild bilateral facet hypertrophy. No significant canal or foraminal stenosis. L3-4: Disc desiccation without significant disc bulge. Mild to moderate facet hypertrophy. No significant canal or foraminal stenosis. L4-5: Negative interspace. Moderate facet and ligament flavum hypertrophy. Mild spinal stenosis, relatively stable. Foramina remain patent. L5-S1: Negative interspace. Mild to moderate facet hypertrophy. No significant canal or lateral recess stenosis. Foramina remain patent. IMPRESSION: 1. Interval height loss with mild marrow edema involving the superior endplate of L1, suspicious for an acute to subacute compression fracture. Height loss is mild measuring no more than 30% without bony retropulsion. 2. No other acute abnormality within the lumbar spine. 3. Mild-to-moderate multilevel facet hypertrophy without significant spinal stenosis, most pronounced at L4-5. Electronically Signed   By: Jeannine Boga M.D.   On: 11/01/2019 23:52   DG Chest Port 1 View  Result Date: 11/01/2019 CLINICAL DATA:  Fatigue, falls EXAM: PORTABLE CHEST 1 VIEW COMPARISON:  09/20/2019 FINDINGS: The heart size and mediastinal  contours are stable. Persistent elevation of the left hemidiaphragm. Chronic linear scarring or atelectasis in the  left mid lung. No new focal airspace consolidation. No sizable pleural fluid collection. No pneumothorax. The visualized skeletal structures are unremarkable. IMPRESSION: No active disease. Electronically Signed   By: Davina Poke D.O.   On: 11/01/2019 14:31   VAS [place] LOWER EXTREMITY VENOUS (DVT) ([facility] and [facility] 7a-7p)  Result Date: 11/01/2019  Lower Venous DVTStudy Indications: Swelling.  Risk Factors: None identified. Limitations: Body habitus and poor ultrasound/tissue interface. Comparison Study: No prior studies. Performing Technologist: Oliver Hum RVT  Examination Guidelines: A complete evaluation includes B-mode imaging, spectral Doppler, color Doppler, and power Doppler as needed of all accessible portions of each vessel. Bilateral testing is considered an integral part of a complete examination. Limited examinations for reoccurring indications may be performed as noted. The reflux portion of the exam is performed with the patient in reverse Trendelenburg.  +---------+---------------+---------+-----------+----------+--------------+ RIGHT    CompressibilityPhasicitySpontaneityPropertiesThrombus Aging +---------+---------------+---------+-----------+----------+--------------+ CFV      Full           Yes      Yes                                 +---------+---------------+---------+-----------+----------+--------------+ SFJ      Full                                                        +---------+---------------+---------+-----------+----------+--------------+ FV Prox  Full                                                        +---------+---------------+---------+-----------+----------+--------------+ FV Mid   Full                                                        +---------+---------------+---------+-----------+----------+--------------+ FV DistalFull                                                         +---------+---------------+---------+-----------+----------+--------------+ PFV      Full                                                        +---------+---------------+---------+-----------+----------+--------------+ POP      Full           Yes      Yes                                 +---------+---------------+---------+-----------+----------+--------------+ PTV      Full                                                        +---------+---------------+---------+-----------+----------+--------------+  PERO     Full                                                        +---------+---------------+---------+-----------+----------+--------------+   +---------+---------------+---------+-----------+----------+--------------+ LEFT     CompressibilityPhasicitySpontaneityPropertiesThrombus Aging +---------+---------------+---------+-----------+----------+--------------+ CFV      Full           Yes      Yes                                 +---------+---------------+---------+-----------+----------+--------------+ SFJ      Full                                                        +---------+---------------+---------+-----------+----------+--------------+ FV Prox  Full                                                        +---------+---------------+---------+-----------+----------+--------------+ FV Mid   Full                                                        +---------+---------------+---------+-----------+----------+--------------+ FV DistalFull                                                        +---------+---------------+---------+-----------+----------+--------------+ PFV      Full                                                        +---------+---------------+---------+-----------+----------+--------------+ POP      Full           Yes      Yes                                  +---------+---------------+---------+-----------+----------+--------------+ PTV      Full                                                        +---------+---------------+---------+-----------+----------+--------------+ PERO     Full                                                        +---------+---------------+---------+-----------+----------+--------------+  Summary: RIGHT: - There is no evidence of deep vein thrombosis in the lower extremity. However, portions of this examination were limited- see technologist comments above.  - No cystic structure found in the popliteal fossa.  LEFT: - There is no evidence of deep vein thrombosis in the lower extremity. However, portions of this examination were limited- see technologist comments above.  - No cystic structure found in the popliteal fossa.  *See table(s) above for measurements and observations. Electronically signed by Deitra Mayo MD on 11/01/2019 at 4:30:17 PM.    Final     Procedures Procedures (including critical care time)  Medications Ordered in ED Medications  heparin injection 5,000 Units (5,000 Units Subcutaneous Given 11/02/19 0544)  0.9 % NaCl with KCl 40 mEq / L  infusion ( Intravenous Rate/Dose Verify 11/02/19 0600)  acetaminophen (TYLENOL) tablet 650 mg (has no administration in time range)    Or  acetaminophen (TYLENOL) suppository 650 mg (has no administration in time range)  ondansetron (ZOFRAN) tablet 4 mg (has no administration in time range)    Or  ondansetron (ZOFRAN) injection 4 mg (has no administration in time range)  senna-docusate (Senokot-S) tablet 1 tablet (has no administration in time range)  aspirin EC tablet 81 mg (has no administration in time range)  citalopram (CELEXA) tablet 20 mg (has no administration in time range)  metoprolol tartrate (LOPRESSOR) tablet 50 mg (has no administration in time range)  pneumococcal 23 valent vaccine (PNEUMOVAX-23) injection 0.5 mL (has no administration  in time range)  gabapentin (NEURONTIN) capsule 1,200 mg (1,200 mg Oral Given 11/01/19 1353)  HYDROmorphone (DILAUDID) injection 1 mg (1 mg Intravenous Given 11/01/19 1354)    ED Course  I have reviewed the triage vital signs and the nursing notes.  Pertinent labs & imaging results that were available during my care of the patient were reviewed by me and considered in my medical decision making (see chart for details).  Clinical Course as of Nov 01 820  Tue Nov 01, 2019  1611 Rechecked, symptoms resolved but feels sleepy, will allow to rest and subsequently try ambulation trial again   [RD]    Clinical Course User Index [RD] Lucrezia Starch, MD   MDM Rules/Calculators/A&P                      70 year old lady presenting to ER with myriad complaints. Notable for generalized fatigue, with generalized weakness, back pain. Regarding back pain, this is acute on chronic, she has a normal neurologic exam but notably has not had any recent spine imaging. Suspect most likely sciatica. Regarding her fall, did endorse head trauma. Labs grossly within normal limits, head CT negative. When attempting ambulation trial, patient seemed somewhat groggy and was not able to ambulate well. At time of signout, plan to reattempt ambulation trial, if patient still having severe pain, difficulty with ambulation, would consider MRI to further evaluate.  Refer to Dr. Hayden Pedro note for final plan and disposition pending reassessment, amb trial, +/- MRI.   Final Clinical Impression(s) / ED Diagnoses Final diagnoses:  Fall, initial encounter  Closed compression fracture of body of L1 vertebra [facility])    Rx / DC Orders ED Discharge Orders    None       Lucrezia Starch, MD 11/02/19 [phone]

## 2019-11-01 NOTE — Progress Notes (Signed)
TOC CM spoke to pt at bedside and offered choice for [facility]. Pt agreeable to [facility] for [facility]. ED provider update. Will have pt walk prior to dc home. Pt states she would go back to SNF rehab. Pt was at [facility] in 08/2019. Pt maybe in her copay days. Pt will need [facility] transport home if dc home tonight with HH. Will send referral to [facility]. Pt has a RW at home. States she lives at home alone. Isidoro Donning RN CCM, [facility] ED TOC CM [phone]

## 2019-11-01 NOTE — ED Triage Notes (Signed)
Per [facility] pt from home for fatigue and falling a lot over the past couple days. Pt lives alone.  Pt c/o back pains.

## 2019-11-01 NOTE — Progress Notes (Signed)
Bilateral lower extremity venous duplex has been completed. Preliminary results can be found in CV Proc through chart review.  Results were given to Dr. Stevie Kern.  11/01/19 2:24 PM Olen Cordial RVT

## 2019-11-01 NOTE — ED Notes (Signed)
Pt in MRI.

## 2019-11-01 NOTE — ED Provider Notes (Signed)
Blood pressure 109/82, pulse 64, temperature 97.8 F (36.6 C), temperature source Oral, resp. rate (!) 9, SpO2 90 %.  Assuming care from Dr. Stevie Kern.  In short, Melinda [name] is a 69 y.o. female with a chief complaint of Fatigue and Fall .  Refer to the original H&P for additional details.  The current plan of care is to f/u on UA and after ambulation.  07:48 PM  Pain improved but patient remains drowsy after meds. CM has seen. Will send for MRI lumbar spine. She is agreeable to this. Will send for MRI. COVID negative.   11:59 PM  MRI resulted with question of L1 compression Fx with 30% height loss. No retropulsion. Will consult NSG to discuss. Likely TLSO and admit for PT/OT and pain control.   Spoke with [place] [facility] PA [place]. Discussed case and findings. Advised TLSO brace. Can admit to medicine for pain control and PT/OT and they will see in the office in 6 weeks.   Discussed patient's case with [facility], Dr. Allena Katz to request admission. Patient and family (if present) updated with plan. Care transferred to [facility] service.  I reviewed all nursing notes, vitals, pertinent old records, EKGs, labs, imaging (as available).    Maia Plan, MD 11/02/19 [phone]

## 2019-11-01 NOTE — ED Notes (Signed)
Patient reports testing positive for Covid 19 10/27/19.

## 2019-11-02 DIAGNOSIS — F32A Depression, unspecified: Secondary | ICD-10-CM

## 2019-11-02 DIAGNOSIS — N189 Chronic kidney disease, unspecified: Secondary | ICD-10-CM

## 2019-11-02 DIAGNOSIS — I10 Essential (primary) hypertension: Secondary | ICD-10-CM

## 2019-11-02 DIAGNOSIS — N289 Disorder of kidney and ureter, unspecified: Secondary | ICD-10-CM

## 2019-11-02 DIAGNOSIS — S32010A Wedge compression fracture of first lumbar vertebra, initial encounter for closed fracture: Secondary | ICD-10-CM

## 2019-11-02 DIAGNOSIS — E876 Hypokalemia: Secondary | ICD-10-CM

## 2019-11-02 DIAGNOSIS — F419 Anxiety disorder, unspecified: Secondary | ICD-10-CM

## 2019-11-02 DIAGNOSIS — F329 Major depressive disorder, single episode, unspecified: Secondary | ICD-10-CM

## 2019-11-02 LAB — URINALYSIS, ROUTINE W REFLEX MICROSCOPIC
Bilirubin Urine: NEGATIVE
Glucose, UA: NEGATIVE mg/dL
Hgb urine dipstick: NEGATIVE
Ketones, ur: NEGATIVE mg/dL
Leukocytes,Ua: NEGATIVE
Nitrite: NEGATIVE
Protein, ur: NEGATIVE mg/dL
Specific Gravity, Urine: 1.02 (ref 1.005–1.030)
pH: 5 (ref 5.0–8.0)

## 2019-11-02 LAB — CBC
HCT: 35.8 % — ABNORMAL LOW (ref 36.0–46.0)
Hemoglobin: 11 g/dL — ABNORMAL LOW (ref 12.0–15.0)
MCH: 27.9 pg (ref 26.0–34.0)
MCHC: 30.7 g/dL (ref 30.0–36.0)
MCV: 90.9 fL (ref 80.0–100.0)
Platelets: 292 10*3/uL (ref 150–400)
RBC: 3.94 MIL/uL (ref 3.87–5.11)
RDW: 12.8 % (ref 11.5–15.5)
WBC: 6.5 10*3/uL (ref 4.0–10.5)
nRBC: 0 % (ref 0.0–0.2)

## 2019-11-02 LAB — BASIC METABOLIC PANEL
Anion gap: 9 (ref 5–15)
BUN: 14 mg/dL (ref 8–23)
CO2: 30 mmol/L (ref 22–32)
Calcium: 9.1 mg/dL (ref 8.9–10.3)
Chloride: 104 mmol/L (ref 98–111)
Creatinine, Ser: 1.25 mg/dL — ABNORMAL HIGH (ref 0.44–1.00)
GFR calc Af Amer: 51 mL/min — ABNORMAL LOW (ref >60)
GFR calc non Af Amer: 44 mL/min — ABNORMAL LOW (ref >60)
Glucose, Bld: 102 mg/dL — ABNORMAL HIGH (ref 70–99)
Potassium: 3.7 mmol/L (ref 3.5–5.1)
Sodium: 143 mmol/L (ref 135–145)

## 2019-11-02 MED ORDER — HEPARIN SODIUM (PORCINE) 5000 UNIT/ML IJ SOLN
5000.0000 [IU] | Freq: Three times a day (TID) | INTRAMUSCULAR | Status: DC
  Administered 2019-11-02 – 2019-11-03 (×5): 5000 [IU] via SUBCUTANEOUS
  Filled 2019-11-02 (×5): qty 1

## 2019-11-02 MED ORDER — POLYVINYL ALCOHOL 1.4 % OP SOLN
2.0000 [drp] | Freq: Two times a day (BID) | OPHTHALMIC | Status: DC | PRN
  Filled 2019-11-02: qty 15

## 2019-11-02 MED ORDER — ACETAMINOPHEN 325 MG PO TABS
650.0000 mg | ORAL_TABLET | Freq: Four times a day (QID) | ORAL | Status: DC | PRN
  Administered 2019-11-02: 650 mg via ORAL
  Filled 2019-11-02: qty 2

## 2019-11-02 MED ORDER — ASPIRIN EC 81 MG PO TBEC
81.0000 mg | DELAYED_RELEASE_TABLET | Freq: Every day | ORAL | Status: DC
  Administered 2019-11-02 – 2019-11-05 (×3): 81 mg via ORAL
  Filled 2019-11-02 (×3): qty 1

## 2019-11-02 MED ORDER — ACETAMINOPHEN 500 MG PO TABS
1000.0000 mg | ORAL_TABLET | Freq: Three times a day (TID) | ORAL | Status: DC
  Administered 2019-11-02 – 2019-11-05 (×9): 1000 mg via ORAL
  Filled 2019-11-02 (×9): qty 2

## 2019-11-02 MED ORDER — FLUTICASONE PROPIONATE 50 MCG/ACT NA SUSP: 2.0000 | Freq: Every day | NASAL | Status: DC | PRN

## 2019-11-02 MED ORDER — SUMATRIPTAN SUCCINATE 25 MG PO TABS
25.0000 mg | ORAL_TABLET | ORAL | Status: DC | PRN
  Filled 2019-11-02: qty 1

## 2019-11-02 MED ORDER — CYANOCOBALAMIN 500 MCG PO TABS
500.0000 ug | ORAL_TABLET | Freq: Every day | ORAL | Status: DC
  Administered 2019-11-03: 500 ug via ORAL
  Filled 2019-11-02 (×3): qty 1

## 2019-11-02 MED ORDER — VITAMIN D 25 MCG (1000 UNIT) PO TABS
2000.0000 [IU] | ORAL_TABLET | Freq: Every day | ORAL | Status: DC
  Administered 2019-11-03 – 2019-11-05 (×2): 2000 [IU] via ORAL
  Filled 2019-11-02 (×2): qty 2

## 2019-11-02 MED ORDER — METOPROLOL TARTRATE 50 MG PO TABS
50.0000 mg | ORAL_TABLET | Freq: Two times a day (BID) | ORAL | Status: DC
  Administered 2019-11-02 – 2019-11-04 (×3): 50 mg via ORAL
  Filled 2019-11-02 (×5): qty 1

## 2019-11-02 MED ORDER — CITALOPRAM HYDROBROMIDE 20 MG PO TABS
20.0000 mg | ORAL_TABLET | Freq: Every day | ORAL | Status: DC
  Administered 2019-11-02 – 2019-11-05 (×4): 20 mg via ORAL
  Filled 2019-11-02 (×4): qty 1

## 2019-11-02 MED ORDER — ONDANSETRON HCL 4 MG PO TABS: 4.0000 mg | ORAL_TABLET | Freq: Four times a day (QID) | ORAL | Status: DC | PRN

## 2019-11-02 MED ORDER — AMITRIPTYLINE HCL 50 MG PO TABS
150.0000 mg | ORAL_TABLET | Freq: Every day | ORAL | Status: DC
  Administered 2019-11-02 – 2019-11-03 (×2): 150 mg via ORAL
  Filled 2019-11-02 (×4): qty 1

## 2019-11-02 MED ORDER — ACETAMINOPHEN 650 MG RE SUPP: 650.0000 mg | Freq: Four times a day (QID) | RECTAL | Status: DC | PRN

## 2019-11-02 MED ORDER — LORATADINE 10 MG PO TABS
10.0000 mg | ORAL_TABLET | Freq: Every day | ORAL | Status: DC
  Administered 2019-11-02 – 2019-11-05 (×4): 10 mg via ORAL
  Filled 2019-11-02 (×4): qty 1

## 2019-11-02 MED ORDER — GABAPENTIN 400 MG PO CAPS
400.0000 mg | ORAL_CAPSULE | Freq: Two times a day (BID) | ORAL | Status: DC
  Administered 2019-11-02 – 2019-11-05 (×7): 400 mg via ORAL
  Filled 2019-11-02 (×7): qty 1

## 2019-11-02 MED ORDER — CARBOXYMETHYLCELLULOSE SODIUM 0.5 % OP SOLN: 2.0000 [drp] | Freq: Two times a day (BID) | OPHTHALMIC | Status: DC | PRN

## 2019-11-02 MED ORDER — SENNOSIDES-DOCUSATE SODIUM 8.6-50 MG PO TABS: 1.0000 | ORAL_TABLET | Freq: Every evening | ORAL | Status: DC | PRN

## 2019-11-02 MED ORDER — ONDANSETRON HCL 4 MG/2ML IJ SOLN: 4.0000 mg | Freq: Four times a day (QID) | INTRAMUSCULAR | Status: DC | PRN

## 2019-11-02 MED ORDER — OXYCODONE HCL 5 MG PO TABS
15.0000 mg | ORAL_TABLET | Freq: Four times a day (QID) | ORAL | Status: DC | PRN
  Administered 2019-11-02 – 2019-11-05 (×4): 15 mg via ORAL
  Filled 2019-11-02 (×4): qty 3

## 2019-11-02 MED ORDER — PNEUMOCOCCAL VAC POLYVALENT 25 MCG/0.5ML IJ INJ
0.5000 mL | INJECTION | INTRAMUSCULAR | Status: AC
  Administered 2019-11-03: 0.5 mL via INTRAMUSCULAR
  Filled 2019-11-02: qty 0.5

## 2019-11-02 MED ORDER — POTASSIUM CHLORIDE IN NACL 40-0.9 MEQ/L-% IV SOLN
INTRAVENOUS | Status: DC
  Administered 2019-11-02 (×2): 125 mL/h via INTRAVENOUS
  Filled 2019-11-02 (×2): qty 1000

## 2019-11-02 MED ORDER — ASCORBIC ACID 500 MG PO TABS
500.0000 mg | ORAL_TABLET | Freq: Two times a day (BID) | ORAL | Status: DC
  Administered 2019-11-02 – 2019-11-05 (×5): 500 mg via ORAL
  Filled 2019-11-02 (×5): qty 1

## 2019-11-02 NOTE — TOC Initial Note (Signed)
Transition of Care [facility]) - Initial/Assessment Note    Patient Details  Name: Melinda [name] MRN: [id number] Date of Birth: [birth year]  Transition of [facility]) CM/SW Contact:    Lennart Pall, LCSW Phone Number: 11/02/2019, 2:19 PM  Clinical Narrative:    Met with pt to review potential d/c needs.  It appears that referral placed for [facility] while pt in ED, however, now admitted with findings of acute/ subacute lumbar fx.  Pt unable to work with PT today due to waiting for delivery of brace.   Reviewed pt's prior SNF stays and HH coverage.  Pt confirms that she lives at [facility] senior living apartments and has to be independent to manage there.  Asks if I might be able to get her moved to 2 bedroom apt so her son could stay - explained she would need to do this through [employer].  Pt moves quickly among topics and needs minor redirection.  She agrees that her d/c plan could be home with [facility] vs possible need for SNF/ short rehab again depending on her PT session and safety.  Will await PT eval and will follow up with pt to make definitive d/c plan.               Expected Discharge Plan: [place] Barriers to Discharge: Continued Medical Work up   Patient Goals and CMS Choice Patient states their goals for this hospitalization and ongoing recovery are:: pt goal to be independent as she lives alone CMS Medicare.gov Compare Post Acute Care list provided to:: Patient Choice offered to / list presented to : Patient  Expected Discharge Plan and Services Expected Discharge Plan: [place] In-house Referral: Clinical Social Work     Living arrangements for the past 2 months: Apartment                                      Prior Living Arrangements/Services Living arrangements for the past 2 months: Apartment Lives with:: Self Patient language and need for interpreter reviewed:: Yes Do you feel safe going back to the place where you live?:  Yes(if she is independent with mobility)      Need for Family Participation in Patient Care: No (Comment) Care giver support system in place?: No (comment) Current home services: DME(canes, rolling walker, 3n1 commode) Criminal Activity/Legal Involvement Pertinent to Current Situation/Hospitalization: No - Comment as needed  Activities of Daily Living Home Assistive Devices/Equipment: Eyeglasses, Walker (specify type) ADL Screening (condition at time of admission) Patient's cognitive ability adequate to safely complete daily activities?: Yes Is the patient deaf or have difficulty hearing?: No Does the patient have difficulty seeing, even when wearing glasses/contacts?: No Does the patient have difficulty concentrating, remembering, or making decisions?: No Patient able to express need for assistance with ADLs?: Yes Does the patient have difficulty dressing or bathing?: Yes Independently performs ADLs?: Yes (appropriate for developmental age) Does the patient have difficulty walking or climbing stairs?: Yes Weakness of Legs: Both Weakness of Arms/Hands: Both  Permission Sought/Granted   Permission granted to share information with : Yes, Verbal Permission Granted  Share Information with NAME: Ovidio Hanger     Permission granted to share info w Relationship: daughter  Permission granted to share info w Contact Information: [phone]  Emotional Assessment Appearance:: Appears stated age Attitude/Demeanor/Rapport: Engaged, Gracious Affect (typically observed): Accepting, Pleasant Orientation: : Oriented to  Self, Oriented to Place, Oriented to  Time, Oriented to Situation Alcohol / Substance Use: Not Applicable Psych Involvement: No (comment)  Admission diagnosis:  Weakness [R53.1] Fall, initial encounter [W19.XXXA] Closed compression fracture of body of L1 vertebra (HCC) [S32.010A] Closed compression fracture of L1 vertebra, initial encounter ([place]) [S32.010A] Patient Active  Problem List   Diagnosis Date Noted  . Closed compression fracture of first lumbar vertebra ([place]) 11/02/2019  . Anxiety and depression 11/02/2019  . Hypokalemia 11/02/2019  . Dry eyes, bilateral 10/05/2019  . Mixed hyperlipidemia 10/05/2019  . Lower extremity weakness 09/18/2019  . Migraine headache 09/18/2019  . Acute kidney injury ([place]) 05/10/2019  . Near syncope 05/09/2019  . Fall at home, initial encounter 05/09/2019  . History of 2019 novel coronavirus disease (COVID-19) 05/09/2019  . Anemia of chronic disease 05/09/2019  . Acute on chronic renal insufficiency 05/09/2019  . COVID-19 virus infection 10/12/2018  . CAP (community acquired pneumonia) 10/06/2018  . Polyneuropathy in diseases classified elsewhere ([place]) 10/04/2018  . Prediabetes 09/12/2017  . High risk medication use 09/12/2017  . Frequent falls 09/12/2017  . Chronic pain syndrome 08/14/2017  . Gastroesophageal reflux disease 08/14/2017  . Cough variant asthma 03/14/2017  . Pulmonary hypertension ([place]) 03/12/2017  . Right-sided chest pain 11/11/2016  . Asthma 11/11/2016  . Atypical chest pain   . Costochondritis   . Chronic congestive heart failure ([place])   . MDD (major depressive disorder), recurrent episode ([place]) 05/23/2015  . Allergic rhinitis 11/03/2014  . Paresthesia 09/08/2014  . Peripheral neuropathy 07/14/2014  . Morbid obesity due to excess calories ([place]) 07/14/2014  . Pain in limb 07/13/2014  . Abnormality of gait 07/13/2014  . Fibromyalgia 06/13/2014  . Spinal stenosis 01/27/2013  . HTN (hypertension) 11/30/2012   PCP:  Sandrea Hughs, NP Pharmacy:   [id number] [id number]: [phone]: [phone] [facility] [address] [place]Phone: [phone]: [phone] [place] [place]Phone: [phone]: [phone]    Social Determinants of Health (SDOH) Interventions    Readmission Risk Interventions No flowsheet data found.

## 2019-11-02 NOTE — Progress Notes (Addendum)
Same day note  Patient seen and examined at bedside.  Patient was admitted to the hospital for back pain.  Patient does have history of chronic back pain and sciatica at baseline.  Complains of difficulty ambulating and unable to stand up.  At the time of my evaluation, patient complains of severe back pain with radiation down the legs.  Physical examination reveals female in mild to moderate distress due to pain, tenderness of the back on palpation.  Laboratory data and imaging was reviewed  Assessment and Plan. Post fall with acute on chronic back pain secondary to acute/subacute compression fracture of the L1 vertebra.  Patient did have CT scan followed by MRI of the back which showed marrow edema in vertebral compression fracture of L1.  ED provider had spoken with neurosurgery who recommended T SLO brace.  Patient is very symptomatic and tender so I have spoken with the IR for possible kyphoplasty.  It appears that the patient is a good candidate for kyphoplasty.  Acute kidney injury on CKD IIIb. Hold NSAIDs for now.  Hold Lasix for now.  Received IV fluid hydration overnight.  Check BMP in a.m.  Creatinine on presentation at 1.4.  Hypokalemia Improved with replacement.  Check BMP in a.m.  Hypertension Continue Lopressor.  Hold Lasix for now.  Anxiety and depression Continue citalopram, resume Elavil.  Decrease the dose of Neurontin.  Patient was mildly somnolent in the ED.  Will closely monitor.  Disposition.  At this time patient has severe back pain with ambulatory dysfunction and inability to bear weight.  Unsafe for discharge.Patient will benefit from kyphoplasty as per interventional radiology.  Insurance authorization pending.  Patient will need PT OT after kyphoplasty.  Consults made:  interventional radiology  No Charge  Signed,  Tenny Craw, MD [facility]

## 2019-11-02 NOTE — H&P (Signed)
History and Physical    [name] KYH:[id number] DOB: 1950/09/27 DOA: 11/01/2019  PCP: Caesar Bookman, NP  Patient coming from: Home  I have personally briefly reviewed patient's old medical records in [facility]  Chief Complaint: Fall at home, back pain  HPI: Melinda [name] is a 69 y.o. female with medical history significant for hypertension, CKD stage III, osteoarthritis, chronic back pain, and anxiety/depression who presents to the ED for evaluation of back pain after a fall at home.  History is limited from patient due to hypersomnolence after receiving medications in the ED and therefore majority history is obtained from EDP and chart review.  Patient presented to the ED for evaluation of fatigue and falling at home over the last couple days.  She has been having back pain.  She lives alone.  She is otherwise unable to provide further relevant history due to hypersomnolence.  ED Course:  Initial vitals showed BP 132/77, pulse 62, RR 12, SPO2 100% on room air.  Labs notable for WBC 6.9, hemoglobin 11.6, platelets 311,000, sodium 141, potassium 3.1, bicarb 25, BUN 15, creatinine 1.43, serum glucose 93.  SARS-CoV-2 PCR is negative.  Influenza A/B PCR's are negative.  Portable chest x-ray is negative for focal consolidation, edema, or effusion.  CT head without contrast is negative for acute intracranial process.  MRI lumbar spine without contrast shows changes suggestive of acute to subacute L1 compression fracture without other acute abnormality within the lumbar spine.  Mild to moderate multilevel facet hypertrophy without significant spinal stenosis is noted, most pronounced at L4-5.  Lower extremity Dopplers were obtained and negative for DVT bilaterally.  Patient was given gabapentin and Dilaudid.  EDP discussed case with on-call neurosurgery who recommended TLSO placement, PT/OT, pain control, and follow-up outpatient in 6 weeks.  The hospitalist service was  consulted to admit for further evaluation/management.  Review of Systems:  Unable to obtain full review of systems due to hypersomnolence.   Past Medical History:  Diagnosis Date  . Acute kidney injury (HCC)   . Allergy   . Anxiety   . Arthritis    "99% of my body" (11/11/2016)  . Asthma   . Bursitis of both hips   . Cataract   . Cervical scoliosis   . Chronic back pain    "all over my back" (11/11/2016)  . Depressive disorder, not elsewhere classified   . Enthesopathy of hip region   . Environmental allergies    "I take Claritin qd; 365 days/year" (11/11/2016)  . Essential hypertension, benign   . Fibromyalgia   . Frequent falls   . GERD (gastroesophageal reflux disease)   . Headache    "at least 1/wk; may last for 2 days or so" (11/11/2016)  . History of blood transfusion 1985   w/hysterectomy  . Lumbar stenosis   . Migraine    "a few/month" (11/11/2016)  . Muscle weakness (generalized)   . Myalgia and myositis, unspecified   . Osteoporosis   . Other abnormal blood chemistry   . Other malaise and fatigue   . Raynaud's syndrome   . Sciatic nerve pain, left   . Sciatica   . Unspecified vitamin D deficiency     Past Surgical History:  Procedure Laterality Date  . ABDOMINAL HYSTERECTOMY  1985  . CESAREAN SECTION  1976; 1979  . SHOULDER OPEN ROTATOR CUFF REPAIR Left     Social History:  reports that she has never smoked. She has never used smokeless tobacco. She reports  that she does not drink alcohol or use drugs.  No Known Allergies  Family History  Problem Relation Age of Onset  . Dementia Mother   . Heart disease Mother   . Hypertension Mother   . Diabetes Mother   . Diabetes Sister   . Cancer Sister        lung  . Cancer Other        Nepher (Mothers side)  . Diabetes Maternal Grandmother   . Arthritis Maternal Grandmother   . Diabetes Cousin        Mother's side  . Arthritis Sister   . Arthritis Cousin        Mother's side   . Colon cancer Neg  Hx   . Esophageal cancer Neg Hx   . Liver cancer Neg Hx   . Pancreatic cancer Neg Hx   . Rectal cancer Neg Hx   . Stomach cancer Neg Hx      Prior to Admission medications   Medication Sig Start Date End Date Taking? Authorizing Provider  acetaminophen (TYLENOL) 500 MG tablet Take 1,000 mg by mouth every 8 (eight) hours. For Pain   Yes [provider]  amitriptyline (ELAVIL) 150 MG tablet Take 1 tablet (150 mg total) by mouth at bedtime. 09/26/19  Yes Margit Hanks, MD  ascorbic acid (VITAMIN C) 500 MG tablet Take 1 tablet (500 mg total) by mouth 2 (two) times daily. 09/26/19  Yes Margit Hanks, MD  aspirin EC 81 MG tablet Take 81 mg by mouth daily.   Yes [provider]  carboxymethylcellulose (REFRESH PLUS) 0.5 % SOLN Place 2 drops into both eyes 2 (two) times daily as needed (dry eyes).    Yes [provider]  citalopram (CELEXA) 20 MG tablet Take 1 tablet (20 mg total) by mouth daily. 10/05/19  Yes [name], [name] C, NP  [employer] D3 50 MCG (2000 UT) CAPS TAKE 1 CAPSULE BY MOUTH EVERY DAY 10/25/19  Yes [name], [name] C, NP  fluticasone (FLONASE) 50 MCG/ACT nasal spray PLACE 2 SPRAYS INTO BOTH NOSTRILS DAILY AS NEEDED FOR ALLERGIES OR RHINITIS. 10/25/19  Yes [name], [name] C, NP  furosemide (LASIX) 40 MG tablet Take 1 tablet (40 mg total) by mouth daily. 09/26/19  Yes Margit Hanks, MD  gabapentin (NEURONTIN) 400 MG capsule Take 2 capsules (800 mg total) by mouth 2 (two) times daily. POLYNEUROPATHY 10/05/19  Yes [name], [name] C, NP  loratadine (CLARITIN) 10 MG tablet TAKE 1 TABLET BY MOUTH EVERY DAY 10/25/19  Yes [name], [name] C, NP  metoprolol tartrate (LOPRESSOR) 50 MG tablet TAKE ONE TABLET BY MOUTH TWICE DAILY FOR BLOOD PRESSURE 10/10/19  Yes [name], [name] C, NP  oxyCODONE (ROXICODONE) 15 MG immediate release tablet Take 1 tablet (15 mg total) by mouth every 8 (eight) hours as needed for pain. 10/05/19  Yes [name], [name] C, NP  SUMAtriptan (IMITREX) 25 MG tablet  TAKE 1 TAB EVERY 2 HRS AS NEEDED FOR MIGRAINE OR HEADACHE. MAY REPEAT IN 2 HRS IF PERSISTS/RECURS. 09/26/19  Yes Margit Hanks, MD  vitamin B-12 (CYANOCOBALAMIN) 500 MCG tablet Take 1 tablet (500 mcg total) by mouth daily. 09/26/19  Yes Margit Hanks, MD    Physical Exam: Vitals:   11/01/19 1530 11/01/19 1832 11/01/19 2122 11/01/19 2251  BP: 109/82 125/79 113/61 (!) 149/83  Pulse: 64 68 71 71  Resp: (!) 9 11 10 10   Temp:      TempSrc:      SpO2: 90%  95% 93% 100%  Exam limited due to degree of somnolence. Constitutional: Obese woman resting supine in bed, somnolent, NAD, calm, appears comfortable Eyes: PERRL, lids and conjunctivae normal ENMT: Mucous membranes are moist. Posterior pharynx clear of any exudate or lesions..  Neck: normal, supple, no masses. Respiratory: clear to auscultation anteriorly. Normal respiratory effort. No accessory muscle use.  Cardiovascular: Regular rate and rhythm, no murmurs / rubs / gallops. No lower extremity pitting edema. 2+ pedal pulses. Abdomen: no tenderness, no masses palpated. No hepatosplenomegaly. Bowel sounds positive.  Musculoskeletal: no clubbing / cyanosis. No joint deformity upper and lower extremities.  Exam limited due to somnolence but will intermittently move all extremities equally. Skin: no rashes, lesions, ulcers. No induration Neurologic: Limited due to hypersomnolence.  Sensation appears intact.  Moving all extremities. Psychiatric: Hypersomnolent.  Will intermittently awaken to voice and moderate stimulation otherwise not communicating adequately.  Labs on Admission: I have personally reviewed following labs and imaging studies  CBC: Recent Labs  Lab 11/01/19 1352  WBC 6.9  NEUTROABS 3.0  HGB 11.6*  HCT 38.0  MCV 90.7  PLT 270   Basic Metabolic Panel: Recent Labs  Lab 11/01/19 1352  NA 141  K 3.1*  CL 104  CO2 25  GLUCOSE 93  BUN 15  CREATININE 1.43*  CALCIUM 9.0   GFR: CrCl cannot be calculated (Unknown  ideal weight.). Liver Function Tests: No results for input(s): AST, ALT, ALKPHOS, BILITOT, PROT, ALBUMIN in the last 168 hours. No results for input(s): LIPASE, AMYLASE in the last 168 hours. No results for input(s): AMMONIA in the last 168 hours. Coagulation Profile: No results for input(s): INR, PROTIME in the last 168 hours. Cardiac Enzymes: No results for input(s): CKTOTAL, CKMB, CKMBINDEX, TROPONINI in the last 168 hours. BNP (last 3 results) No results for input(s): PROBNP in the last 8760 hours. HbA1C: No results for input(s): HGBA1C in the last 72 hours. CBG: No results for input(s): GLUCAP in the last 168 hours. Lipid Profile: No results for input(s): CHOL, HDL, LDLCALC, TRIG, CHOLHDL, LDLDIRECT in the last 72 hours. Thyroid Function Tests: No results for input(s): TSH, T4TOTAL, FREET4, T3FREE, THYROIDAB in the last 72 hours. Anemia Panel: No results for input(s): VITAMINB12, FOLATE, FERRITIN, TIBC, IRON, RETICCTPCT in the last 72 hours. Urine analysis:    Component Value Date/Time   COLORURINE YELLOW 09/12/2019 1227   APPEARANCEUR CLEAR 09/12/2019 1227   APPEARANCEUR Clear 05/23/2015 1447   LABSPEC 1.015 09/12/2019 1227   PHURINE 5.0 09/12/2019 1227   GLUCOSEU NEGATIVE 09/12/2019 1227   HGBUR NEGATIVE 09/12/2019 [place] 09/12/2019 1227   BILIRUBINUR Negative 05/23/2015 1447   [place] 09/12/2019 1227   PROTEINUR NEGATIVE 09/12/2019 1227   NITRITE NEGATIVE 09/12/2019 1227   LEUKOCYTESUR NEGATIVE 09/12/2019 1227    Radiological Exams on Admission: CT Head Wo Contrast  Result Date: 11/01/2019 CLINICAL DATA:  Acute pain due to trauma. Fatigue and frequent falls. EXAM: CT HEAD WITHOUT CONTRAST TECHNIQUE: Contiguous axial images were obtained from the base of the skull through the vertex without intravenous contrast. COMPARISON:  CT head dated 10/10/2017. FINDINGS: Brain: No evidence of acute infarction, hemorrhage, hydrocephalus, extra-axial  collection or mass lesion/mass effect. Vascular: No hyperdense vessel or unexpected calcification. Skull: Normal. Negative for fracture or focal lesion. Sinuses/Orbits: No acute finding. Other: None. IMPRESSION: No acute intracranial process. Electronically Signed   By: Constance Holster M.D.   On: 11/01/2019 15:09   MR LUMBAR SPINE WO CONTRAST  Result Date: 11/01/2019  CLINICAL DATA:  Initial evaluation for low back pain, recent falls. EXAM: MRI LUMBAR SPINE WITHOUT CONTRAST TECHNIQUE: Multiplanar, multisequence MR imaging of the lumbar spine was performed. No intravenous contrast was administered. COMPARISON:  Prior MRI from 11/21/2017. FINDINGS: Segmentation: Standard. Lowest well-formed disc space labeled the L5-S1 level. Alignment: Physiologic with preservation of the normal lumbar lordosis. No listhesis. Vertebrae: Subtle linear T1 hypointensity the with mild marrow edema seen extending through the central and right aspect of the superior endplate of T1, suspicious for an acute to subacute compression fracture (series 5, image 9). Superimposed endplate Schmorl's node deformity noted. Relatively mild height loss of up to approximately 30% without bony retropulsion. This is benign/mechanical in appearance. Vertebral body height otherwise maintained without evidence for acute or chronic fracture. Bone marrow signal intensity within normal limits. No discrete or worrisome osseous lesions. No other abnormal marrow edema. Conus medullaris and cauda equina: Conus extends to the T12-L1 level. Conus and cauda equina appear normal. Paraspinal and other soft tissues: Paraspinous soft tissues within normal limits. Simple exophytic cyst seen extending from the lower pole the left kidney. Visualized visceral structures otherwise unremarkable. Disc levels: T12-L1: Disc desiccation without significant disc bulge. No canal or foraminal stenosis. L1-2: Negative interspace. Mild bilateral facet hypertrophy, greater on the  right. No stenosis. L2-3: Disc desiccation without disc bulge. Mild bilateral facet hypertrophy. No significant canal or foraminal stenosis. L3-4: Disc desiccation without significant disc bulge. Mild to moderate facet hypertrophy. No significant canal or foraminal stenosis. L4-5: Negative interspace. Moderate facet and ligament flavum hypertrophy. Mild spinal stenosis, relatively stable. Foramina remain patent. L5-S1: Negative interspace. Mild to moderate facet hypertrophy. No significant canal or lateral recess stenosis. Foramina remain patent. IMPRESSION: 1. Interval height loss with mild marrow edema involving the superior endplate of L1, suspicious for an acute to subacute compression fracture. Height loss is mild measuring no more than 30% without bony retropulsion. 2. No other acute abnormality within the lumbar spine. 3. Mild-to-moderate multilevel facet hypertrophy without significant spinal stenosis, most pronounced at L4-5. Electronically Signed   By: Rise Mu M.D.   On: 11/01/2019 23:52   DG Chest Port 1 View  Result Date: 11/01/2019 CLINICAL DATA:  Fatigue, falls EXAM: PORTABLE CHEST 1 VIEW COMPARISON:  09/20/2019 FINDINGS: The heart size and mediastinal contours are stable. Persistent elevation of the left hemidiaphragm. Chronic linear scarring or atelectasis in the left mid lung. No new focal airspace consolidation. No sizable pleural fluid collection. No pneumothorax. The visualized skeletal structures are unremarkable. IMPRESSION: No active disease. Electronically Signed   By: Duanne Guess D.O.   On: 11/01/2019 14:31   VAS [place] LOWER EXTREMITY VENOUS (DVT) (MC and WL 7a-7p)  Result Date: 11/01/2019  Lower Venous DVTStudy Indications: Swelling.  Risk Factors: None identified. Limitations: Body habitus and poor ultrasound/tissue interface. Comparison Study: No prior studies. Performing Technologist: Chanda Busing RVT  Examination Guidelines: A complete evaluation includes B-mode  imaging, spectral Doppler, color Doppler, and power Doppler as needed of all accessible portions of each vessel. Bilateral testing is considered an integral part of a complete examination. Limited examinations for reoccurring indications may be performed as noted. The reflux portion of the exam is performed with the patient in reverse Trendelenburg.  +---------+---------------+---------+-----------+----------+--------------+ RIGHT    CompressibilityPhasicitySpontaneityPropertiesThrombus Aging +---------+---------------+---------+-----------+----------+--------------+ CFV      Full           Yes      Yes                                 +---------+---------------+---------+-----------+----------+--------------+  SFJ      Full                                                        +---------+---------------+---------+-----------+----------+--------------+ FV Prox  Full                                                        +---------+---------------+---------+-----------+----------+--------------+ FV Mid   Full                                                        +---------+---------------+---------+-----------+----------+--------------+ FV DistalFull                                                        +---------+---------------+---------+-----------+----------+--------------+ PFV      Full                                                        +---------+---------------+---------+-----------+----------+--------------+ POP      Full           Yes      Yes                                 +---------+---------------+---------+-----------+----------+--------------+ PTV      Full                                                        +---------+---------------+---------+-----------+----------+--------------+ PERO     Full                                                        +---------+---------------+---------+-----------+----------+--------------+    +---------+---------------+---------+-----------+----------+--------------+ LEFT     CompressibilityPhasicitySpontaneityPropertiesThrombus Aging +---------+---------------+---------+-----------+----------+--------------+ CFV      Full           Yes      Yes                                 +---------+---------------+---------+-----------+----------+--------------+ SFJ      Full                                                        +---------+---------------+---------+-----------+----------+--------------+  FV Prox  Full                                                        +---------+---------------+---------+-----------+----------+--------------+ FV Mid   Full                                                        +---------+---------------+---------+-----------+----------+--------------+ FV DistalFull                                                        +---------+---------------+---------+-----------+----------+--------------+ PFV      Full                                                        +---------+---------------+---------+-----------+----------+--------------+ POP      Full           Yes      Yes                                 +---------+---------------+---------+-----------+----------+--------------+ PTV      Full                                                        +---------+---------------+---------+-----------+----------+--------------+ PERO     Full                                                        +---------+---------------+---------+-----------+----------+--------------+     Summary: RIGHT: - There is no evidence of deep vein thrombosis in the lower extremity. However, portions of this examination were limited- see technologist comments above.  - No cystic structure found in the popliteal fossa.  LEFT: - There is no evidence of deep vein thrombosis in the lower extremity. However, portions of this examination were  limited- see technologist comments above.  - No cystic structure found in the popliteal fossa.  *See table(s) above for measurements and observations. Electronically signed by Waverly Ferrari MD on 11/01/2019 at 4:30:17 PM.    Final     EKG: Independently reviewed. Sinus rhythm without acute ischemic changes.  Not significantly changed when compared to prior.  Assessment/Plan Principal Problem:   Closed compression fracture of first lumbar vertebra (HCC) Active Problems:   HTN (hypertension)   Acute on chronic renal insufficiency   Anxiety and depression  Melinda [name] is a 68 y.o. female with medical history significant for hypertension, CKD stage III, osteoarthritis, chronic back pain, and anxiety/depression  who is admitted with acute on chronic back pain in setting of L1 compression fracture and hypersomnolence due to medication side effect.  Fall at home with acute on chronic back pain and acute to subacute L1 compression fracture: She is currently not safe to return home due to fall risk and excessive drowsiness from medication effect.  She lives alone at home.  She was [facility] by [facility] in the ED for [facility] versus SNF pending further evaluation. -PT/OT eval -TLSO brace ordered -EDP spoke with neurosurgery who recommended outpatient follow-up in 6 weeks -Hold further narcotics and sedating medications at this time due to excessive somnolence -Fall precautions  Hypersomnolence: Likely due to medication effect from Dilaudid and high dose of gabapentin in setting of mild acute renal insufficiency.  She is currently too drowsy to safely ambulate. -Hold further narcotics, gabapentin, and Elavil -Place on IV fluid hydration overnight  AKI on CKD stage III: -Start IV fluid hydration overnight and repeat labs in a.m. -Avoid NSAIDs, hold home Lasix  Hypokalemia: Add potassium to IV fluids and repeat labs in a.m.  Hypertension: Resume home Lopressor.  Holding Lasix as above.  Anxiety and  depression: Continue citalopram, holding Elavil.  DVT prophylaxis: Subcutaneous heparin Code Status: Full code Family Communication: None present on admission Disposition Plan: From home, likely discharge to home with home health versus SNF pending PT/OT eval Consults called: EDP discussed with on-call neurosurgery Admission status:  Status is: Observation  The patient remains OBS appropriate and will d/c before 2 midnights.  Dispo: The patient is from: Home              Anticipated d/c is to: Home with home health versus SNF pending PT/OT eval              Anticipated d/c date is: 1 day              Patient currently is not medically stable to d/c.   Darreld Mclean MD [facility]  If 7PM-7AM, please contact night-coverage [email or web address]  11/02/2019, 12:42 AM

## 2019-11-02 NOTE — Consult Note (Signed)
Chief Complaint: Patient was seen in consultation today for L1 vertebral body augmentation Chief Complaint  Patient presents with  . Fatigue  . Fall     Referring Physician(s): Pokhrel,L  Supervising Physician: Simonne Come  Patient Status: [facility] - In-pt  History of Present Illness: Melinda [name] is a 69 y.o. female with past medical history significant for anxiety/depression, osteoarthritis, fibromyalgia, hypertension, GERD, lumbar stenosis, migraines, Raynaud's syndrome, chronic kidney disease and chronic back pain.  She was admitted to [facility] on 5/4 due to acute on chronic back pain following a fall at home.  MRI lumbar spine performed yesterday revealed:  1. Interval height loss with mild marrow edema involving the superior endplate of L1, suspicious for an acute to subacute compression fracture. Height loss is mild measuring no more than 30% without bony retropulsion. 2. No other acute abnormality within the lumbar spine. 3. Mild-to-moderate multilevel facet hypertrophy without significant spinal stenosis, most pronounced at L4-5  Patient currently has moderate to severe pain in the mid lower back region with some radiation around to the anterior lower abdominal/pelvic region and down bilateral lower extremities.  Pain is exacerbated with movement.  She does have prior history of sciatica as well.  Current labs include WBC 6.5, hemoglobin 11, platelets 292K, 1.35, PT/INR pending, UA pending, COVID-19 negative.  Outpatient medications for pain include oxycodone, Neurontin, and Tylenol.  Request now received from [facility] for consideration of L1 vertebral body augmentation.    Past Medical History:  Diagnosis Date  . Acute kidney injury (HCC)   . Allergy   . Anxiety   . Arthritis    "99% of my body" (11/11/2016)  . Asthma   . Bursitis of both hips   . Cataract   . Cervical scoliosis   . Chronic back pain    "all over my back" (11/11/2016)  . Depressive  disorder, not elsewhere classified   . Enthesopathy of hip region   . Environmental allergies    "I take Claritin qd; 365 days/year" (11/11/2016)  . Essential hypertension, benign   . Fibromyalgia   . Frequent falls   . GERD (gastroesophageal reflux disease)   . Headache    "at least 1/wk; may last for 2 days or so" (11/11/2016)  . History of blood transfusion 1985   w/hysterectomy  . Lumbar stenosis   . Migraine    "a few/month" (11/11/2016)  . Muscle weakness (generalized)   . Myalgia and myositis, unspecified   . Osteoporosis   . Other abnormal blood chemistry   . Other malaise and fatigue   . Raynaud's syndrome   . Sciatic nerve pain, left   . Sciatica   . Unspecified vitamin D deficiency     Past Surgical History:  Procedure Laterality Date  . ABDOMINAL HYSTERECTOMY  1985  . CESAREAN SECTION  1976; 1979  . SHOULDER OPEN ROTATOR CUFF REPAIR Left     Allergies: Patient has no known allergies.  Medications: Prior to Admission medications   Medication Sig Start Date End Date Taking? Authorizing Provider  acetaminophen (TYLENOL) 500 MG tablet Take 1,000 mg by mouth every 8 (eight) hours. For Pain   Yes [provider]  amitriptyline (ELAVIL) 150 MG tablet Take 1 tablet (150 mg total) by mouth at bedtime. 09/26/19  Yes Margit Hanks, MD  ascorbic acid (VITAMIN C) 500 MG tablet Take 1 tablet (500 mg total) by mouth 2 (two) times daily. 09/26/19  Yes Margit Hanks, MD  aspirin EC 81  MG tablet Take 81 mg by mouth daily.   Yes [provider]  carboxymethylcellulose (REFRESH PLUS) 0.5 % SOLN Place 2 drops into both eyes 2 (two) times daily as needed (dry eyes).    Yes [provider]  citalopram (CELEXA) 20 MG tablet Take 1 tablet (20 mg total) by mouth daily. 10/05/19  Yes [name], [name] C, NP  [employer] D3 50 MCG (2000 UT) CAPS TAKE 1 CAPSULE BY MOUTH EVERY DAY 10/25/19  Yes [name], [name] C, NP  fluticasone (FLONASE) 50 MCG/ACT nasal spray PLACE 2  SPRAYS INTO BOTH NOSTRILS DAILY AS NEEDED FOR ALLERGIES OR RHINITIS. 10/25/19  Yes [name], [name] C, NP  furosemide (LASIX) 40 MG tablet Take 1 tablet (40 mg total) by mouth daily. 09/26/19  Yes Margit Hanks, MD  gabapentin (NEURONTIN) 400 MG capsule Take 2 capsules (800 mg total) by mouth 2 (two) times daily. POLYNEUROPATHY 10/05/19  Yes [name], [name] C, NP  loratadine (CLARITIN) 10 MG tablet TAKE 1 TABLET BY MOUTH EVERY DAY 10/25/19  Yes [name], [name] C, NP  metoprolol tartrate (LOPRESSOR) 50 MG tablet TAKE ONE TABLET BY MOUTH TWICE DAILY FOR BLOOD PRESSURE 10/10/19  Yes [name], [name] C, NP  oxyCODONE (ROXICODONE) 15 MG immediate release tablet Take 1 tablet (15 mg total) by mouth every 8 (eight) hours as needed for pain. 10/05/19  Yes [name], [name] C, NP  SUMAtriptan (IMITREX) 25 MG tablet TAKE 1 TAB EVERY 2 HRS AS NEEDED FOR MIGRAINE OR HEADACHE. MAY REPEAT IN 2 HRS IF PERSISTS/RECURS. 09/26/19  Yes Margit Hanks, MD  vitamin B-12 (CYANOCOBALAMIN) 500 MCG tablet Take 1 tablet (500 mcg total) by mouth daily. 09/26/19  Yes Margit Hanks, MD     Family History  Problem Relation Age of Onset  . Dementia Mother   . Heart disease Mother   . Hypertension Mother   . Diabetes Mother   . Diabetes Sister   . Cancer Sister        lung  . Cancer Other        Nepher (Mothers side)  . Diabetes Maternal Grandmother   . Arthritis Maternal Grandmother   . Diabetes Cousin        Mother's side  . Arthritis Sister   . Arthritis Cousin        Mother's side   . Colon cancer Neg Hx   . Esophageal cancer Neg Hx   . Liver cancer Neg Hx   . Pancreatic cancer Neg Hx   . Rectal cancer Neg Hx   . Stomach cancer Neg Hx     Social History   Socioeconomic History  . Marital status: Divorced    Spouse name: Not on file  . Number of children: 3  . Years of education: 35  . Highest education level: Not on file  Occupational History    Comment: retired  Tobacco Use  . Smoking status: Never  Smoker  . Smokeless tobacco: Never Used  Substance and Sexual Activity  . Alcohol use: No    Alcohol/week: 0.0 standard drinks  . Drug use: No  . Sexual activity: Yes  Other Topics Concern  . Not on file  Social History Narrative   Patient lives with her grandson. Patient is retired.   Education some college.   Right handed.    Caffeine two cups daily.   Social Determinants of Health   Financial Resource Strain:   . Difficulty of Paying Living Expenses:   Food Insecurity:   .  Worried About Programme researcher, broadcasting/film/video in the Last Year:   . Barista in the Last Year:   Transportation Needs:   . Freight forwarder (Medical):   Marland Kitchen Lack of Transportation (Non-Medical):   Physical Activity:   . Days of Exercise per Week:   . Minutes of Exercise per Session:   Stress:   . Feeling of Stress :   Social Connections:   . Frequency of Communication with Friends and Family:   . Frequency of Social Gatherings with Friends and Family:   . Attends Religious Services:   . Active Member of Clubs or Organizations:   . Attends Banker Meetings:   Marland Kitchen Marital Status:       Review of Systems see above; does report occasional headaches, occasional chest discomfort, dyspnea with exertion, occasional cough, lower abdominal/back discomfort with radiation down to both lower extremities.  Denies nausea vomiting or bleeding.  Vital Signs: BP 107/65 (BP Location: Right Arm)   Pulse 79   Temp 97.9 F (36.6 C) (Oral)   Resp 14   Ht 5\' 4"  (1.626 m)   Wt 195 lb (88.5 kg)   SpO2 100%   BMI 33.47 kg/m   Physical Exam awake, alert.  Chest clear to auscultation bilaterally.  Heart with regular rate and rhythm.  Abdomen soft, positive bowel sounds, some mild lower abdominal/pelvic tenderness to palpation.  Extremities with full range of motion.  Moderately tender L1 paravertebral tenderness to palpation.  Imaging: CT Head Wo Contrast  Result Date: 11/01/2019 CLINICAL DATA:  Acute  pain due to trauma. Fatigue and frequent falls. EXAM: CT HEAD WITHOUT CONTRAST TECHNIQUE: Contiguous axial images were obtained from the base of the skull through the vertex without intravenous contrast. COMPARISON:  CT head dated 10/10/2017. FINDINGS: Brain: No evidence of acute infarction, hemorrhage, hydrocephalus, extra-axial collection or mass lesion/mass effect. Vascular: No hyperdense vessel or unexpected calcification. Skull: Normal. Negative for fracture or focal lesion. Sinuses/Orbits: No acute finding. Other: None. IMPRESSION: No acute intracranial process. Electronically Signed   By: 10/12/2017 M.D.   On: 11/01/2019 15:09   MR LUMBAR SPINE WO CONTRAST  Result Date: 11/01/2019 CLINICAL DATA:  Initial evaluation for low back pain, recent falls. EXAM: MRI LUMBAR SPINE WITHOUT CONTRAST TECHNIQUE: Multiplanar, multisequence MR imaging of the lumbar spine was performed. No intravenous contrast was administered. COMPARISON:  Prior MRI from 11/21/2017. FINDINGS: Segmentation: Standard. Lowest well-formed disc space labeled the L5-S1 level. Alignment: Physiologic with preservation of the normal lumbar lordosis. No listhesis. Vertebrae: Subtle linear T1 hypointensity the with mild marrow edema seen extending through the central and right aspect of the superior endplate of T1, suspicious for an acute to subacute compression fracture (series 5, image 9). Superimposed endplate Schmorl's node deformity noted. Relatively mild height loss of up to approximately 30% without bony retropulsion. This is benign/mechanical in appearance. Vertebral body height otherwise maintained without evidence for acute or chronic fracture. Bone marrow signal intensity within normal limits. No discrete or worrisome osseous lesions. No other abnormal marrow edema. Conus medullaris and cauda equina: Conus extends to the T12-L1 level. Conus and cauda equina appear normal. Paraspinal and other soft tissues: Paraspinous soft tissues  within normal limits. Simple exophytic cyst seen extending from the lower pole the left kidney. Visualized visceral structures otherwise unremarkable. Disc levels: T12-L1: Disc desiccation without significant disc bulge. No canal or foraminal stenosis. L1-2: Negative interspace. Mild bilateral facet hypertrophy, greater on the right. No stenosis. L2-3: Disc desiccation  without disc bulge. Mild bilateral facet hypertrophy. No significant canal or foraminal stenosis. L3-4: Disc desiccation without significant disc bulge. Mild to moderate facet hypertrophy. No significant canal or foraminal stenosis. L4-5: Negative interspace. Moderate facet and ligament flavum hypertrophy. Mild spinal stenosis, relatively stable. Foramina remain patent. L5-S1: Negative interspace. Mild to moderate facet hypertrophy. No significant canal or lateral recess stenosis. Foramina remain patent. IMPRESSION: 1. Interval height loss with mild marrow edema involving the superior endplate of L1, suspicious for an acute to subacute compression fracture. Height loss is mild measuring no more than 30% without bony retropulsion. 2. No other acute abnormality within the lumbar spine. 3. Mild-to-moderate multilevel facet hypertrophy without significant spinal stenosis, most pronounced at L4-5. Electronically Signed   By: Rise Mu M.D.   On: 11/01/2019 23:52   DG Chest Port 1 View  Result Date: 11/01/2019 CLINICAL DATA:  Fatigue, falls EXAM: PORTABLE CHEST 1 VIEW COMPARISON:  09/20/2019 FINDINGS: The heart size and mediastinal contours are stable. Persistent elevation of the left hemidiaphragm. Chronic linear scarring or atelectasis in the left mid lung. No new focal airspace consolidation. No sizable pleural fluid collection. No pneumothorax. The visualized skeletal structures are unremarkable. IMPRESSION: No active disease. Electronically Signed   By: Duanne Guess D.O.   On: 11/01/2019 14:31   VAS [place] LOWER EXTREMITY VENOUS (DVT)  ([facility] and [facility] 7a-7p)  Result Date: 11/01/2019  Lower Venous DVTStudy Indications: Swelling.  Risk Factors: None identified. Limitations: Body habitus and poor ultrasound/tissue interface. Comparison Study: No prior studies. Performing Technologist: Chanda Busing RVT  Examination Guidelines: A complete evaluation includes B-mode imaging, spectral Doppler, color Doppler, and power Doppler as needed of all accessible portions of each vessel. Bilateral testing is considered an integral part of a complete examination. Limited examinations for reoccurring indications may be performed as noted. The reflux portion of the exam is performed with the patient in reverse Trendelenburg.  +---------+---------------+---------+-----------+----------+--------------+ RIGHT    CompressibilityPhasicitySpontaneityPropertiesThrombus Aging +---------+---------------+---------+-----------+----------+--------------+ CFV      Full           Yes      Yes                                 +---------+---------------+---------+-----------+----------+--------------+ SFJ      Full                                                        +---------+---------------+---------+-----------+----------+--------------+ FV Prox  Full                                                        +---------+---------------+---------+-----------+----------+--------------+ FV Mid   Full                                                        +---------+---------------+---------+-----------+----------+--------------+ FV DistalFull                                                        +---------+---------------+---------+-----------+----------+--------------+  PFV      Full                                                        +---------+---------------+---------+-----------+----------+--------------+ POP      Full           Yes      Yes                                  +---------+---------------+---------+-----------+----------+--------------+ PTV      Full                                                        +---------+---------------+---------+-----------+----------+--------------+ PERO     Full                                                        +---------+---------------+---------+-----------+----------+--------------+   +---------+---------------+---------+-----------+----------+--------------+ LEFT     CompressibilityPhasicitySpontaneityPropertiesThrombus Aging +---------+---------------+---------+-----------+----------+--------------+ CFV      Full           Yes      Yes                                 +---------+---------------+---------+-----------+----------+--------------+ SFJ      Full                                                        +---------+---------------+---------+-----------+----------+--------------+ FV Prox  Full                                                        +---------+---------------+---------+-----------+----------+--------------+ FV Mid   Full                                                        +---------+---------------+---------+-----------+----------+--------------+ FV DistalFull                                                        +---------+---------------+---------+-----------+----------+--------------+ PFV      Full                                                        +---------+---------------+---------+-----------+----------+--------------+  POP      Full           Yes      Yes                                 +---------+---------------+---------+-----------+----------+--------------+ PTV      Full                                                        +---------+---------------+---------+-----------+----------+--------------+ PERO     Full                                                         +---------+---------------+---------+-----------+----------+--------------+     Summary: RIGHT: - There is no evidence of deep vein thrombosis in the lower extremity. However, portions of this examination were limited- see technologist comments above.  - No cystic structure found in the popliteal fossa.  LEFT: - There is no evidence of deep vein thrombosis in the lower extremity. However, portions of this examination were limited- see technologist comments above.  - No cystic structure found in the popliteal fossa.  *See table(s) above for measurements and observations. Electronically signed by Deitra Mayo MD on 11/01/2019 at 4:30:17 PM.    Final     Labs:  CBC: Recent Labs    09/22/19 0000 10/05/19 0942 11/01/19 1352 11/02/19 0244  WBC 5.7 4.9 6.9 6.5  HGB 11.0* 12.1 11.6* 11.0*  HCT 34* 37.0 38.0 35.8*  PLT 315 386 311 292    COAGS: No results for input(s): INR, APTT in the last 8760 hours.  BMP: Recent Labs    09/12/19 1240 09/19/19 0000 09/22/19 0000 10/05/19 0942 11/01/19 1352 11/02/19 0244  NA 142   < > 140 142 141 143  K 3.6   < > 4.3 3.5 3.1* 3.7  CL 102   < > 102 105 104 104  CO2 27   < > 25* 29 25 30   GLUCOSE 104*  --   --  70 93 102*  BUN 15   < > 14 7 15 14   CALCIUM 9.6   < > 9.2 9.2 9.0 9.1  CREATININE 1.70*   < > 1.2* 1.24* 1.43* 1.25*  GFRNONAA 31*   < > 45.20 45* 38* 44*  GFRAA 36*   < > 52.39 52* 44* 51*   < > = values in this interval not displayed.    LIVER FUNCTION TESTS: Recent Labs    03/29/19 1011 09/12/19 1240 10/05/19 0942  BILITOT 0.8 0.6 0.7  AST 11 17 15   ALT 6 16 19   ALKPHOS  --  71  --   PROT 6.8 7.6 7.0  ALBUMIN  --  4.2  --     TUMOR MARKERS: No results for input(s): AFPTM, CEA, CA199, CHROMGRNA in the last 8760 hours.  Assessment and Plan: 69 y.o. female with past medical history significant for anxiety/depression, osteoarthritis, fibromyalgia, hypertension, GERD, lumbar stenosis, migraines, Raynaud's syndrome,  chronic kidney disease and chronic back pain.  She was admitted to [facility] on 5/4 due to acute on  chronic back pain following a fall at home.  MRI lumbar spine performed yesterday revealed:  1. Interval height loss with mild marrow edema involving the superior endplate of L1, suspicious for an acute to subacute compression fracture. Height loss is mild measuring no more than 30% without bony retropulsion. 2. No other acute abnormality within the lumbar spine. 3. Mild-to-moderate multilevel facet hypertrophy without significant spinal stenosis, most pronounced at L4-5  Patient currently has moderate to severe pain in the mid lower back region with some radiation around to the anterior lower abdominal/pelvic region and down bilateral lower extremities.  Pain is exacerbated with movement.  She does have prior history of sciatica as well.  Current labs include WBC 6.5, hemoglobin 11, platelets 292K, 1.35, PT/INR pending, UA pending, COVID-19 negative.  Outpatient medications for pain include oxycodone, Neurontin, and Tylenol.  Request now received from [facility] for consideration of L1 vertebral body augmentation.  Latest imaging studies have been reviewed by Dr. Loreta Ave.  Patient appears to be suitable candidate for L1 vertebral body augmentation.  Details/risks of procedure, including but not limited to, internal bleeding, infection, injury to adjacent structures, inability to eradicate back pain discussed with patient with her understanding and consent.  Await results of urinalysis.  Insurance approval pending.  Earliest procedure can be done would be on 5/7.  Heparin injections would need to be held evening prior to any as well as on day of procedure.    Thank you for this interesting consult.  I greatly enjoyed meeting Melinda [name] and look forward to participating in their care.  A copy of this report was sent to the requesting provider on this date.  Electronically Signed: D. Jeananne Rama,  PA-C 11/02/2019, 2:25 PM   I spent a total of 40 minutes  in face to face in clinical consultation, greater than 50% of which was counseling/coordinating care for L1 vertebral body augmentation

## 2019-11-02 NOTE — Progress Notes (Signed)
rOUTED Orthopedic Tech Progress Note Patient Details:  Melinda [name] 07/24/1950 [id number]  Patient ID: Melinda [name], female   DOB: 09/24/1950, 69 y.o.   MRN: [id number]   Melinda [name] 11/02/2019, 6:49 AMRouted TLSO brace order to [facility]

## 2019-11-02 NOTE — Progress Notes (Signed)
PT Cancellation Note  Patient Details Name: Melinda [name] MRN: [id number] DOB: 1950-07-04   Cancelled Treatment:    Reason Eval/Treat Not Completed: Other (comment). Neurosurgery recommended TLSO, delivery of brace pending. Defer PT eval at this time. Continue efforts once brace available   [facility] 11/02/2019, 9:19 AM

## 2019-11-03 DIAGNOSIS — N289 Disorder of kidney and ureter, unspecified: Secondary | ICD-10-CM | POA: Diagnosis not present

## 2019-11-03 DIAGNOSIS — S32018A Other fracture of first lumbar vertebra, initial encounter for closed fracture: Secondary | ICD-10-CM | POA: Diagnosis present

## 2019-11-03 DIAGNOSIS — Z90710 Acquired absence of both cervix and uterus: Secondary | ICD-10-CM | POA: Diagnosis not present

## 2019-11-03 DIAGNOSIS — Y92009 Unspecified place in unspecified non-institutional (private) residence as the place of occurrence of the external cause: Secondary | ICD-10-CM | POA: Diagnosis not present

## 2019-11-03 DIAGNOSIS — E876 Hypokalemia: Secondary | ICD-10-CM | POA: Diagnosis present

## 2019-11-03 DIAGNOSIS — W1830XA Fall on same level, unspecified, initial encounter: Secondary | ICD-10-CM | POA: Diagnosis present

## 2019-11-03 DIAGNOSIS — Z7982 Long term (current) use of aspirin: Secondary | ICD-10-CM | POA: Diagnosis not present

## 2019-11-03 DIAGNOSIS — I10 Essential (primary) hypertension: Secondary | ICD-10-CM | POA: Diagnosis not present

## 2019-11-03 DIAGNOSIS — R296 Repeated falls: Secondary | ICD-10-CM | POA: Diagnosis present

## 2019-11-03 DIAGNOSIS — Z23 Encounter for immunization: Secondary | ICD-10-CM | POA: Diagnosis present

## 2019-11-03 DIAGNOSIS — M797 Fibromyalgia: Secondary | ICD-10-CM | POA: Diagnosis present

## 2019-11-03 DIAGNOSIS — Z6833 Body mass index (BMI) 33.0-33.9, adult: Secondary | ICD-10-CM | POA: Diagnosis not present

## 2019-11-03 DIAGNOSIS — S32010A Wedge compression fracture of first lumbar vertebra, initial encounter for closed fracture: Secondary | ICD-10-CM | POA: Diagnosis not present

## 2019-11-03 DIAGNOSIS — G8929 Other chronic pain: Secondary | ICD-10-CM | POA: Diagnosis present

## 2019-11-03 DIAGNOSIS — F419 Anxiety disorder, unspecified: Secondary | ICD-10-CM | POA: Diagnosis present

## 2019-11-03 DIAGNOSIS — F329 Major depressive disorder, single episode, unspecified: Secondary | ICD-10-CM | POA: Diagnosis present

## 2019-11-03 DIAGNOSIS — N179 Acute kidney failure, unspecified: Secondary | ICD-10-CM | POA: Diagnosis present

## 2019-11-03 DIAGNOSIS — I129 Hypertensive chronic kidney disease with stage 1 through stage 4 chronic kidney disease, or unspecified chronic kidney disease: Secondary | ICD-10-CM | POA: Diagnosis present

## 2019-11-03 DIAGNOSIS — Z79899 Other long term (current) drug therapy: Secondary | ICD-10-CM | POA: Diagnosis not present

## 2019-11-03 DIAGNOSIS — K219 Gastro-esophageal reflux disease without esophagitis: Secondary | ICD-10-CM | POA: Diagnosis present

## 2019-11-03 DIAGNOSIS — N1832 Chronic kidney disease, stage 3b: Secondary | ICD-10-CM | POA: Diagnosis present

## 2019-11-03 DIAGNOSIS — R531 Weakness: Secondary | ICD-10-CM | POA: Diagnosis present

## 2019-11-03 DIAGNOSIS — E669 Obesity, unspecified: Secondary | ICD-10-CM | POA: Diagnosis present

## 2019-11-03 DIAGNOSIS — Z8616 Personal history of COVID-19: Secondary | ICD-10-CM | POA: Diagnosis not present

## 2019-11-03 DIAGNOSIS — G4710 Hypersomnia, unspecified: Secondary | ICD-10-CM | POA: Diagnosis present

## 2019-11-03 LAB — PHOSPHORUS: Phosphorus: 3.6 mg/dL (ref 2.5–4.6)

## 2019-11-03 LAB — COMPREHENSIVE METABOLIC PANEL
ALT: 9 U/L (ref 0–44)
AST: 12 U/L — ABNORMAL LOW (ref 15–41)
Albumin: 3.5 g/dL (ref 3.5–5.0)
Alkaline Phosphatase: 54 U/L (ref 38–126)
Anion gap: 8 (ref 5–15)
BUN: 13 mg/dL (ref 8–23)
CO2: 26 mmol/L (ref 22–32)
Calcium: 8.6 mg/dL — ABNORMAL LOW (ref 8.9–10.3)
Chloride: 105 mmol/L (ref 98–111)
Creatinine, Ser: 1.4 mg/dL — ABNORMAL HIGH (ref 0.44–1.00)
GFR calc Af Amer: 45 mL/min — ABNORMAL LOW (ref >60)
GFR calc non Af Amer: 39 mL/min — ABNORMAL LOW (ref >60)
Glucose, Bld: 93 mg/dL (ref 70–99)
Potassium: 4.5 mmol/L (ref 3.5–5.1)
Sodium: 139 mmol/L (ref 135–145)
Total Bilirubin: 0.5 mg/dL (ref 0.3–1.2)
Total Protein: 6.3 g/dL — ABNORMAL LOW (ref 6.5–8.1)

## 2019-11-03 LAB — CBC
HCT: 33.1 % — ABNORMAL LOW (ref 36.0–46.0)
Hemoglobin: 9.8 g/dL — ABNORMAL LOW (ref 12.0–15.0)
MCH: 27.3 pg (ref 26.0–34.0)
MCHC: 29.6 g/dL — ABNORMAL LOW (ref 30.0–36.0)
MCV: 92.2 fL (ref 80.0–100.0)
Platelets: 300 10*3/uL (ref 150–400)
RBC: 3.59 MIL/uL — ABNORMAL LOW (ref 3.87–5.11)
RDW: 13.1 % (ref 11.5–15.5)
WBC: 6.5 10*3/uL (ref 4.0–10.5)
nRBC: 0 % (ref 0.0–0.2)

## 2019-11-03 LAB — PROTIME-INR
INR: 1.1 (ref 0.8–1.2)
Prothrombin Time: 13.9 seconds (ref 11.4–15.2)

## 2019-11-03 LAB — MAGNESIUM: Magnesium: 2.2 mg/dL (ref 1.7–2.4)

## 2019-11-03 MED ORDER — POLYETHYLENE GLYCOL 3350 17 G PO PACK
17.0000 g | PACK | Freq: Every day | ORAL | Status: DC
  Administered 2019-11-03 – 2019-11-05 (×2): 17 g via ORAL
  Filled 2019-11-03 (×2): qty 1

## 2019-11-03 MED ORDER — HEPARIN SODIUM (PORCINE) 5000 UNIT/ML IJ SOLN: 5000.0000 [IU] | Freq: Three times a day (TID) | INTRAMUSCULAR | Status: DC

## 2019-11-03 MED ORDER — BISACODYL 10 MG RE SUPP: 10.0000 mg | Freq: Every day | RECTAL | Status: DC | PRN

## 2019-11-03 NOTE — Progress Notes (Signed)
PT Cancellation Note  Patient Details Name: Melinda [name] MRN: [id number] DOB: 1950-08-05   Cancelled Treatment:    Reason Eval/Treat Not Completed: Patient not medically ready. Per chart review, pt possibly having kyphoplasty pending insurance approval. Please reorder PT after surgical intervention, thanks.  Tori Sherl Yzaguirre PT, DPT 11/03/19, 1:08 PM

## 2019-11-03 NOTE — Plan of Care (Signed)

## 2019-11-03 NOTE — Progress Notes (Signed)
PROGRESS NOTE  Melinda [name] VOU:[id number] DOB: 03-Nov-1950 DOA: 11/01/2019 PCP: Caesar Bookman, NP   LOS: 0 days   Brief narrative: As per HPI,  Melinda [name] is a 69 y.o. female with medical history significant for hypertension, CKD stage III, osteoarthritis, chronic back pain, and anxiety/depression who presents to the ED for evaluation of back pain after a fall at home.  History was limited from patient due to hypersomnolence after receiving medications in the ED and therefore majority of the history was obtained from EDP and chart review. Patient presented to the ED for evaluation of fatigue and falling at home over the last couple days. ED Course:  Initial vitals showed BP 132/77, pulse 62, RR 12, SPO2 100% on room air.Labs notable for WBC 6.9, hemoglobin 11.6, platelets 311,000, sodium 141, potassium 3.1, bicarb 25, BUN 15, creatinine 1.43, serum glucose 93.  SARS-CoV-2 PCR is negative.  Influenza A/B PCR's are negative. Portable chest x-ray is negative for focal consolidation, edema, or effusion. CT head without contrast is negative for acute intracranial process. MRI lumbar spine without contrast shows changes suggestive of acute to subacute L1 compression fracture without other acute abnormality within the lumbar spine.  Mild to moderate multilevel facet hypertrophy without significant spinal stenosis is noted, most pronounced at L4-5. Bilateral Lower extremity Dopplers were obtained and negative for DVT bilaterally.The hospitalist service was consulted to admit for further evaluation/management.  Assessment/Plan:  Principal Problem:   Closed compression fracture of first lumbar vertebra (HCC) Active Problems:   HTN (hypertension)   Acute on chronic renal insufficiency   Anxiety and depression   Hypokalemia  Fall with fracture of L1 vertebrae Post fall with acute on chronic back pain secondary to acute/subacute compression fracture of the L1 vertebra.  Patient did have CT scan  followed by MRI of the back which showed marrow edema in vertebral compression fracture of L1.  ED provider had spoken with neurosurgery who recommended TSLO brace.  Patient is very symptomatic and tender, so I have spoken with the IR for possible kyphoplasty.  It appears that the patient is a good candidate for kyphoplasty pending insurance authorization.  Patient is still awaiting T SLO brace  Acute kidney injury on CKD IIIb. Hold NSAIDs and Lasix for now.  Some IV fluids.  Creatinine today at 1.4.  Hypokalemia Replenished and improved.  Check BMP in a.m.  Essential hypertension Continue Lopressor.  Hold Lasix for now.  Blood pressure is stable  Anxiety and depression Continue citalopram, Elavil.  Decreased the dose of Neurontin. Will closely monitor.  Disposition.  At this time. patient has severe back pain with ambulatory dysfunction and inability to bear weight.  Unsafe for discharge.Patient will benefit from kyphoplasty as per interventional radiology.  Insurance authorization pending.  Patient will need PT OT after kyphoplasty.  VTE Prophylaxis: Lovenox subcu  Code Status: Full code  Family Communication: None  Status is: Observation  The patient will require care spanning > 2 midnights and should be moved to inpatient because: Unsafe d/c plan, plan for kyphoplasty ambulatory dysfunction IV analgesia  Dispo: The patient is from: Home              Anticipated d/c is to: Likely home but undetermined at this time              Anticipated d/c date is: 2 days              Patient currently is not medically stable to d/c.  Consultants:  Interventional Radiology  Procedures:  none  Antibiotics:  . None  Anti-infectives (From admission, onward)   None     Subjective: Today, patient was seen and examined at bedside.  Complains of back pain especially on movement.  No dyspnea, chest pain, shortness of breath.  No nausea, vomiting  Objective: Vitals:   11/03/19  0554 11/03/19 1002  BP: 121/79   Pulse: 69 79  Resp: 16   Temp: 97.6 F (36.4 C)   SpO2: 100%     Intake/Output Summary (Last 24 hours) at 11/03/2019 1147 Last data filed at 11/03/2019 1031 Gross per 24 hour  Intake 1460 ml  Output 300 ml  Net 1160 ml   Filed Weights   11/02/19 0219  Weight: 88.5 kg   Body mass index is 33.47 kg/m.   Physical Exam: GENERAL: Patient is alert awake and oriented. Not in obvious distress.  Obese HENT: No scleral pallor or icterus. Pupils equally reactive to light. Oral mucosa is moist NECK: is supple, no gross swelling noted. CHEST: Clear to auscultation. No crackles or wheezes.  Diminished breath sounds bilaterally.  Tenderness of the lumbar spine on palpation. CVS: S1 and S2 heard, no murmur. Regular rate and rhythm.  ABDOMEN: Soft, non-tender, bowel sounds are present. EXTREMITIES: No edema. CNS: Cranial nerves are intact. No focal motor deficits. SKIN: warm and dry without rashes.  Data Review: I have personally reviewed the following laboratory data and studies,  CBC: Recent Labs  Lab 11/01/19 1352 11/02/19 0244 11/03/19 0235  WBC 6.9 6.5 6.5  NEUTROABS 3.0  --   --   HGB 11.6* 11.0* 9.8*  HCT 38.0 35.8* 33.1*  MCV 90.7 90.9 92.2  PLT 311 292 300   Basic Metabolic Panel: Recent Labs  Lab 11/01/19 1352 11/02/19 0244 11/03/19 0235  NA 141 143 139  K 3.1* 3.7 4.5  CL 104 104 105  CO2 25 30 26   GLUCOSE 93 102* 93  BUN 15 14 13   CREATININE 1.43* 1.25* 1.40*  CALCIUM 9.0 9.1 8.6*  MG  --   --  2.2  PHOS  --   --  3.6   Liver Function Tests: Recent Labs  Lab 11/03/19 0235  AST 12*  ALT 9  ALKPHOS 54  BILITOT 0.5  PROT 6.3*  ALBUMIN 3.5   No results for input(s): LIPASE, AMYLASE in the last 168 hours. No results for input(s): AMMONIA in the last 168 hours. Cardiac Enzymes: No results for input(s): CKTOTAL, CKMB, CKMBINDEX, TROPONINI in the last 168 hours. BNP (last 3 results) Recent Labs    05/10/19 0411  BNP  312.3*    ProBNP (last 3 results) No results for input(s): PROBNP in the last 8760 hours.  CBG: No results for input(s): GLUCAP in the last 168 hours. Recent Results (from the past 240 hour(s))  Respiratory Panel by RT PCR (Flu A&B, Covid) - Nasopharyngeal Swab     Status: None   Collection Time: 11/01/19  1:52 PM   Specimen: Nasopharyngeal Swab  Result Value Ref Range Status   SARS Coronavirus 2 by RT PCR NEGATIVE NEGATIVE Final    Comment: (NOTE) SARS-CoV-2 target nucleic acids are NOT DETECTED. The SARS-CoV-2 RNA is generally detectable in upper respiratoy specimens during the acute phase of infection. The lowest concentration of SARS-CoV-2 viral copies this assay can detect is 131 copies/mL. A negative result does not preclude SARS-Cov-2 infection and should not be used as the sole basis for treatment or other patient management decisions. A  negative result may occur with  improper specimen collection/handling, submission of specimen other than nasopharyngeal swab, presence of viral mutation(s) within the areas targeted by this assay, and inadequate number of viral copies (<131 copies/mL). A negative result must be combined with clinical observations, patient history, and epidemiological information. The expected result is Negative. Fact Sheet for Patients:  PinkCheek.be Fact Sheet for Healthcare Providers:  GravelBags.it This test is not yet ap proved or cleared by the [place] FDA and  has been authorized for detection and/or diagnosis of SARS-CoV-2 by FDA under an Emergency Use Authorization (EUA). This EUA will remain  in effect (meaning this test can be used) for the duration of the COVID-19 declaration under Section 564(b)(1) of the Act, 21 U.S.C. section 360bbb-3(b)(1), unless the authorization is terminated or revoked sooner.    Influenza A by PCR NEGATIVE NEGATIVE Final   Influenza B by PCR NEGATIVE  NEGATIVE Final    Comment: (NOTE) The Xpert Xpress SARS-CoV-2/FLU/RSV assay is intended as an aid in  the diagnosis of influenza from Nasopharyngeal swab specimens and  should not be used as a sole basis for treatment. Nasal washings and  aspirates are unacceptable for Xpert Xpress SARS-CoV-2/FLU/RSV  testing. Fact Sheet for Patients: PinkCheek.be Fact Sheet for Healthcare Providers: GravelBags.it This test is not yet approved or cleared by the [place] FDA and  has been authorized for detection and/or diagnosis of SARS-CoV-2 by  FDA under an Emergency Use Authorization (EUA). This EUA will remain  in effect (meaning this test can be used) for the duration of the  Covid-19 declaration under Section 564(b)(1) of the Act, 21  U.S.C. section 360bbb-3(b)(1), unless the authorization is  terminated or revoked. Performed at [facility], [place] [address] [place], [place] [address]      Studies: CT Head Wo Contrast  Result Date: 11/01/2019 CLINICAL DATA:  Acute pain due to trauma. Fatigue and frequent falls. EXAM: CT HEAD WITHOUT CONTRAST TECHNIQUE: Contiguous axial images were obtained from the base of the skull through the vertex without intravenous contrast. COMPARISON:  CT head dated 10/10/2017. FINDINGS: Brain: No evidence of acute infarction, hemorrhage, hydrocephalus, extra-axial collection or mass lesion/mass effect. Vascular: No hyperdense vessel or unexpected calcification. Skull: Normal. Negative for fracture or focal lesion. Sinuses/Orbits: No acute finding. Other: None. IMPRESSION: No acute intracranial process. Electronically Signed   By: Constance Holster M.D.   On: 11/01/2019 15:09   MR LUMBAR SPINE WO CONTRAST  Result Date: 11/01/2019 CLINICAL DATA:  Initial evaluation for low back pain, recent falls. EXAM: MRI LUMBAR SPINE WITHOUT CONTRAST TECHNIQUE: Multiplanar, multisequence MR imaging of the  lumbar spine was performed. No intravenous contrast was administered. COMPARISON:  Prior MRI from 11/21/2017. FINDINGS: Segmentation: Standard. Lowest well-formed disc space labeled the L5-S1 level. Alignment: Physiologic with preservation of the normal lumbar lordosis. No listhesis. Vertebrae: Subtle linear T1 hypointensity the with mild marrow edema seen extending through the central and right aspect of the superior endplate of T1, suspicious for an acute to subacute compression fracture (series 5, image 9). Superimposed endplate Schmorl's node deformity noted. Relatively mild height loss of up to approximately 30% without bony retropulsion. This is benign/mechanical in appearance. Vertebral body height otherwise maintained without evidence for acute or chronic fracture. Bone marrow signal intensity within normal limits. No discrete or worrisome osseous lesions. No other abnormal marrow edema. Conus medullaris and cauda equina: Conus extends to the T12-L1 level. Conus and cauda equina appear normal. Paraspinal and other soft tissues: Paraspinous  soft tissues within normal limits. Simple exophytic cyst seen extending from the lower pole the left kidney. Visualized visceral structures otherwise unremarkable. Disc levels: T12-L1: Disc desiccation without significant disc bulge. No canal or foraminal stenosis. L1-2: Negative interspace. Mild bilateral facet hypertrophy, greater on the right. No stenosis. L2-3: Disc desiccation without disc bulge. Mild bilateral facet hypertrophy. No significant canal or foraminal stenosis. L3-4: Disc desiccation without significant disc bulge. Mild to moderate facet hypertrophy. No significant canal or foraminal stenosis. L4-5: Negative interspace. Moderate facet and ligament flavum hypertrophy. Mild spinal stenosis, relatively stable. Foramina remain patent. L5-S1: Negative interspace. Mild to moderate facet hypertrophy. No significant canal or lateral recess stenosis. Foramina  remain patent. IMPRESSION: 1. Interval height loss with mild marrow edema involving the superior endplate of L1, suspicious for an acute to subacute compression fracture. Height loss is mild measuring no more than 30% without bony retropulsion. 2. No other acute abnormality within the lumbar spine. 3. Mild-to-moderate multilevel facet hypertrophy without significant spinal stenosis, most pronounced at L4-5. Electronically Signed   By: Rise Mu M.D.   On: 11/01/2019 23:52   DG Chest Port 1 View  Result Date: 11/01/2019 CLINICAL DATA:  Fatigue, falls EXAM: PORTABLE CHEST 1 VIEW COMPARISON:  09/20/2019 FINDINGS: The heart size and mediastinal contours are stable. Persistent elevation of the left hemidiaphragm. Chronic linear scarring or atelectasis in the left mid lung. No new focal airspace consolidation. No sizable pleural fluid collection. No pneumothorax. The visualized skeletal structures are unremarkable. IMPRESSION: No active disease. Electronically Signed   By: Duanne Guess D.O.   On: 11/01/2019 14:31   VAS [place] LOWER EXTREMITY VENOUS (DVT) ([facility] and [facility] 7a-7p)  Result Date: 11/01/2019  Lower Venous DVTStudy Indications: Swelling.  Risk Factors: None identified. Limitations: Body habitus and poor ultrasound/tissue interface. Comparison Study: No prior studies. Performing Technologist: Chanda Busing RVT  Examination Guidelines: A complete evaluation includes B-mode imaging, spectral Doppler, color Doppler, and power Doppler as needed of all accessible portions of each vessel. Bilateral testing is considered an integral part of a complete examination. Limited examinations for reoccurring indications may be performed as noted. The reflux portion of the exam is performed with the patient in reverse Trendelenburg.  +---------+---------------+---------+-----------+----------+--------------+ RIGHT    CompressibilityPhasicitySpontaneityPropertiesThrombus Aging  +---------+---------------+---------+-----------+----------+--------------+ CFV      Full           Yes      Yes                                 +---------+---------------+---------+-----------+----------+--------------+ SFJ      Full                                                        +---------+---------------+---------+-----------+----------+--------------+ FV Prox  Full                                                        +---------+---------------+---------+-----------+----------+--------------+ FV Mid   Full                                                        +---------+---------------+---------+-----------+----------+--------------+  FV DistalFull                                                        +---------+---------------+---------+-----------+----------+--------------+ PFV      Full                                                        +---------+---------------+---------+-----------+----------+--------------+ POP      Full           Yes      Yes                                 +---------+---------------+---------+-----------+----------+--------------+ PTV      Full                                                        +---------+---------------+---------+-----------+----------+--------------+ PERO     Full                                                        +---------+---------------+---------+-----------+----------+--------------+   +---------+---------------+---------+-----------+----------+--------------+ LEFT     CompressibilityPhasicitySpontaneityPropertiesThrombus Aging +---------+---------------+---------+-----------+----------+--------------+ CFV      Full           Yes      Yes                                 +---------+---------------+---------+-----------+----------+--------------+ SFJ      Full                                                         +---------+---------------+---------+-----------+----------+--------------+ FV Prox  Full                                                        +---------+---------------+---------+-----------+----------+--------------+ FV Mid   Full                                                        +---------+---------------+---------+-----------+----------+--------------+ FV DistalFull                                                        +---------+---------------+---------+-----------+----------+--------------+  PFV      Full                                                        +---------+---------------+---------+-----------+----------+--------------+ POP      Full           Yes      Yes                                 +---------+---------------+---------+-----------+----------+--------------+ PTV      Full                                                        +---------+---------------+---------+-----------+----------+--------------+ PERO     Full                                                        +---------+---------------+---------+-----------+----------+--------------+     Summary: RIGHT: - There is no evidence of deep vein thrombosis in the lower extremity. However, portions of this examination were limited- see technologist comments above.  - No cystic structure found in the popliteal fossa.  LEFT: - There is no evidence of deep vein thrombosis in the lower extremity. However, portions of this examination were limited- see technologist comments above.  - No cystic structure found in the popliteal fossa.  *See table(s) above for measurements and observations. Electronically signed by Waverly Ferrari MD on 11/01/2019 at 4:30:17 PM.    Final       Joycelyn Das, MD  [facility] 11/03/2019

## 2019-11-03 NOTE — Progress Notes (Signed)
Orthopedic Tech Progress Note Patient Details:  Melinda [name] 12/31/1950 [id number]  Patient ID: [name], female   DOB: [birth year], 69 y.o.   MRN: [id number]   Saul Fordyce 11/03/2019, 10:30 AM Routed and called brace order in to [facility]

## 2019-11-03 NOTE — Progress Notes (Signed)
PT Cancellation Note  Patient Details Name: Melinda [name] MRN: [id number] DOB: [birth year]   Cancelled Treatment:    Reason Eval/Treat Not Completed: Other (comment)(still waiting on TLSO). Per RN, pt still has not received TLSO brace and BP is currently 98/50 mmHg in supine. Will continue to check back for eval.  Domenick Bookbinder PT, DPT 11/03/19, 10:26 AM [phone]

## 2019-11-03 NOTE — TOC Progression Note (Signed)
Transition of Care [facility]) - Progression Note    Patient Details  Name: Mammie Meras MRN: [id number] Date of Birth: April 21, 1951  Transition of Care [facility] CM/SW Contact  Lennart Pall, LCSW Phone Number: 11/03/2019, 10:25 AM  Clinical Narrative:   Met briefly with pt this morning who is aware and agreeable with plan for IR procedure/ kyphoplasty once insurance approval received.  Will follow up after and discuss d/c plans further.    Expected Discharge Plan: [place] Barriers to Discharge: Continued Medical Work up  Expected Discharge Plan and Services Expected Discharge Plan: [place] In-house Referral: Clinical Social Work     Living arrangements for the past 2 months: Apartment                                       Social Determinants of Health (SDOH) Interventions    Readmission Risk Interventions No flowsheet data found.

## 2019-11-03 NOTE — Progress Notes (Signed)
IR Brief Note:  IR requested by Dr. Tyson Babinski for possible image-guided L1 vertebral augmentation.   Images/case has been reviewed by Dr. Loreta Ave who approves procedure. Patient has been seen/consented for procedure. Insurance authorization approved. UA unremarkable. INR 1.1 today. Plan for image-guided L1 KP/VP tentatively for tomorrow 11/04/2019 in IR. Patient will be NPO at midnight. Hold Heparin starting now, restart 24 hours post-procedure (orders updated to reflect this).   Please call IR with questions/concerns.   Melinda Boga Priyal Musquiz, PA-C 11/03/2019, 2:06 PM

## 2019-11-03 NOTE — Plan of Care (Signed)
  Problem: Education: Goal: Knowledge of General Education information will improve Description: Including pain rating scale, medication(s)/side effects and non-pharmacologic comfort measures 11/03/2019 1248 by Iantha Fallen, RN Outcome: Progressing 11/03/2019 1247 by Iantha Fallen, RN Outcome: Progressing   Problem: Health Behavior/Discharge Planning: Goal: Ability to manage health-related needs will improve 11/03/2019 1248 by Iantha Fallen, RN Outcome: Progressing 11/03/2019 1247 by Iantha Fallen, RN Outcome: Progressing   Problem: Clinical Measurements: Goal: Ability to maintain clinical measurements within normal limits will improve 11/03/2019 1248 by Iantha Fallen, RN Outcome: Progressing 11/03/2019 1247 by Iantha Fallen, RN Outcome: Progressing Goal: Will remain free from infection 11/03/2019 1248 by Iantha Fallen, RN Outcome: Progressing 11/03/2019 1247 by Iantha Fallen, RN Outcome: Progressing Goal: Diagnostic test results will improve 11/03/2019 1248 by Iantha Fallen, RN Outcome: Progressing 11/03/2019 1247 by Iantha Fallen, RN Outcome: Progressing

## 2019-11-04 ENCOUNTER — Inpatient Hospital Stay (HOSPITAL_COMMUNITY): Payer: Medicare HMO

## 2019-11-04 HISTORY — PX: IR KYPHO LUMBAR INC FX REDUCE BONE BX UNI/BIL CANNULATION INC/IMAGING: IMG5519

## 2019-11-04 LAB — CBC
HCT: 32.9 % — ABNORMAL LOW (ref 36.0–46.0)
Hemoglobin: 10 g/dL — ABNORMAL LOW (ref 12.0–15.0)
MCH: 28.2 pg (ref 26.0–34.0)
MCHC: 30.4 g/dL (ref 30.0–36.0)
MCV: 92.7 fL (ref 80.0–100.0)
Platelets: 305 10*3/uL (ref 150–400)
RBC: 3.55 MIL/uL — ABNORMAL LOW (ref 3.87–5.11)
RDW: 13.1 % (ref 11.5–15.5)
WBC: 6.6 10*3/uL (ref 4.0–10.5)
nRBC: 0 % (ref 0.0–0.2)

## 2019-11-04 LAB — SURGICAL PCR SCREEN
MRSA, PCR: POSITIVE — AB
Staphylococcus aureus: POSITIVE — AB

## 2019-11-04 LAB — COMPREHENSIVE METABOLIC PANEL
ALT: 10 U/L (ref 0–44)
AST: 12 U/L — ABNORMAL LOW (ref 15–41)
Albumin: 3.4 g/dL — ABNORMAL LOW (ref 3.5–5.0)
Alkaline Phosphatase: 54 U/L (ref 38–126)
Anion gap: 8 (ref 5–15)
BUN: 13 mg/dL (ref 8–23)
CO2: 24 mmol/L (ref 22–32)
Calcium: 8.5 mg/dL — ABNORMAL LOW (ref 8.9–10.3)
Chloride: 103 mmol/L (ref 98–111)
Creatinine, Ser: 1.42 mg/dL — ABNORMAL HIGH (ref 0.44–1.00)
GFR calc Af Amer: 44 mL/min — ABNORMAL LOW (ref >60)
GFR calc non Af Amer: 38 mL/min — ABNORMAL LOW (ref >60)
Glucose, Bld: 97 mg/dL (ref 70–99)
Potassium: 4.5 mmol/L (ref 3.5–5.1)
Sodium: 135 mmol/L (ref 135–145)
Total Bilirubin: 0.7 mg/dL (ref 0.3–1.2)
Total Protein: 6.1 g/dL — ABNORMAL LOW (ref 6.5–8.1)

## 2019-11-04 LAB — MAGNESIUM: Magnesium: 2.1 mg/dL (ref 1.7–2.4)

## 2019-11-04 MED ORDER — CEFAZOLIN SODIUM-DEXTROSE 2-4 GM/100ML-% IV SOLN
INTRAVENOUS | Status: AC
  Administered 2019-11-04: 2000 mg
  Filled 2019-11-04: qty 100

## 2019-11-04 MED ORDER — CHLORHEXIDINE GLUCONATE CLOTH 2 % EX PADS: 6.0000 | MEDICATED_PAD | Freq: Once | CUTANEOUS | Status: DC

## 2019-11-04 MED ORDER — MIDAZOLAM HCL 2 MG/2ML IJ SOLN
INTRAMUSCULAR | Status: AC | PRN
  Administered 2019-11-04 (×3): 0.5 mg via INTRAVENOUS
  Administered 2019-11-04: 1 mg via INTRAVENOUS

## 2019-11-04 MED ORDER — MIDAZOLAM HCL 2 MG/2ML IJ SOLN
INTRAMUSCULAR | Status: AC
  Filled 2019-11-04: qty 6

## 2019-11-04 MED ORDER — LIDOCAINE HCL (PF) 1 % IJ SOLN
INTRAMUSCULAR | Status: AC
  Filled 2019-11-04: qty 30

## 2019-11-04 MED ORDER — MUPIROCIN 2 % EX OINT
1.0000 "application " | TOPICAL_OINTMENT | Freq: Two times a day (BID) | CUTANEOUS | Status: DC
  Administered 2019-11-04 – 2019-11-05 (×3): 1 via NASAL
  Filled 2019-11-04: qty 22

## 2019-11-04 MED ORDER — FENTANYL CITRATE (PF) 100 MCG/2ML IJ SOLN
INTRAMUSCULAR | Status: AC
  Filled 2019-11-04: qty 4

## 2019-11-04 MED ORDER — FENTANYL CITRATE (PF) 100 MCG/2ML IJ SOLN
INTRAMUSCULAR | Status: AC | PRN
  Administered 2019-11-04 (×2): 50 ug via INTRAVENOUS

## 2019-11-04 MED ORDER — CHLORHEXIDINE GLUCONATE CLOTH 2 % EX PADS
6.0000 | MEDICATED_PAD | Freq: Every day | CUTANEOUS | Status: DC
  Administered 2019-11-04 – 2019-11-05 (×2): 6 via TOPICAL

## 2019-11-04 MED ORDER — HEPARIN SODIUM (PORCINE) 5000 UNIT/ML IJ SOLN
5000.0000 [IU] | Freq: Three times a day (TID) | INTRAMUSCULAR | Status: DC
  Administered 2019-11-05: 5000 [IU] via SUBCUTANEOUS
  Filled 2019-11-04: qty 1

## 2019-11-04 MED ORDER — LIDOCAINE HCL (PF) 1 % IJ SOLN
INTRAMUSCULAR | Status: AC | PRN
  Administered 2019-11-04: 10 mL

## 2019-11-04 MED ORDER — CHLORHEXIDINE GLUCONATE CLOTH 2 % EX PADS: 6.0000 | MEDICATED_PAD | Freq: Every day | CUTANEOUS | Status: DC

## 2019-11-04 MED ORDER — CHLORHEXIDINE GLUCONATE CLOTH 2 % EX PADS
6.0000 | MEDICATED_PAD | Freq: Every day | CUTANEOUS | Status: DC
  Administered 2019-11-04: 06:00:00 6 via TOPICAL

## 2019-11-04 NOTE — Telephone Encounter (Signed)
Will refill when patient is discharged.

## 2019-11-04 NOTE — Procedures (Signed)
Pre procedural Dx: Symptomatic compression fracture Post procedural Dx: Same  Technically successful cement augmentation of the L1 vertebral body.  Patient is to lie flat for 3 hours.  EBL: Minimal Complications: None immediate.  Jay Haydee Jabbour, MD Pager #: [phone]   

## 2019-11-04 NOTE — Telephone Encounter (Signed)
She stated that she was being released today. But in chart is shows active admission.

## 2019-11-04 NOTE — Telephone Encounter (Signed)
Pended Rx and sent to [name] for approval.  ?

## 2019-11-04 NOTE — Progress Notes (Addendum)
MEDICATION-RELATED CONSULT NOTE   IR Procedure Consult - Anticoagulant/Antiplatelet PTA/Inpatient Med List Review by Pharmacist   Procedure: IR Kyphoplasty of compression fracture of the L1 vertebra    Completed: 5/7 at 12/51  Post-Procedural bleeding risk per IR MD assessment:  High  Antithrombotic medications on inpatient or PTA profile prior to procedure:   Heparin 5000 units SQ TID - LD 5/6 1358   Recommended restart time per IR Post-Procedure Guidelines:  Day + 1 (Next AM)  Other considerations:      Plan:     Resume Heparin 5000 units SQ TID on 5/8 AM. Pharmacy to sign off consult.  Lynann Beaver PharmD, BCPS Clinical Pharmacist [facility] main pharmacy [phone] 11/04/2019 1:46 PM

## 2019-11-04 NOTE — Telephone Encounter (Signed)
Melinda [name],Is patient still admitted in the hospital? She was in the ED 11/01/2019 then admitted.I don't see a discharge summary.

## 2019-11-04 NOTE — Progress Notes (Signed)
PROGRESS NOTE  Melinda [name] XBD:[id number] DOB: 1951/03/30 DOA: 11/01/2019 PCP: Caesar Bookman, NP   LOS: 1 day   Brief narrative: As per HPI,  Melinda [name] is a 69 y.o. female with medical history significant for hypertension, CKD stage III, osteoarthritis, chronic back pain, and anxiety/depression who presents to the ED for evaluation of back pain after a fall at home.  History was limited from patient due to hypersomnolence after receiving medications in the ED and therefore majority of the history was obtained from EDP and chart review. Patient presented to the ED for evaluation of fatigue and falling at home over the last couple days.   ED Course:  Initial vitals showed BP 132/77, pulse 62, RR 12, SPO2 100% on room air.Labs notable for WBC 6.9, hemoglobin 11.6, platelets 311,000, sodium 141, potassium 3.1, bicarb 25, BUN 15, creatinine 1.43, serum glucose 93.  SARS-CoV-2 PCR is negative.  Influenza A/B PCR's are negative. Portable chest x-ray is negative for focal consolidation, edema, or effusion. CT head without contrast is negative for acute intracranial process. MRI lumbar spine without contrast shows changes suggestive of acute to subacute L1 compression fracture without other acute abnormality within the lumbar spine.  Mild to moderate multilevel facet hypertrophy without significant spinal stenosis is noted, most pronounced at L4-5. Bilateral Lower extremity Dopplers were obtained and negative for DVT bilaterally.The hospitalist service was consulted to admit for further evaluation/management.  Assessment/Plan:  Principal Problem:   Closed compression fracture of first lumbar vertebra (HCC) Active Problems:   HTN (hypertension)   Acute on chronic renal insufficiency   Anxiety and depression   Hypokalemia  Falls with fracture of L1 vertebrae Post fall with acute on chronic back pain secondary to acute/subacute compression fracture of the L1 vertebra.  Patient did have CT scan  followed by MRI of the back which showed marrow edema in vertebral compression fracture of L1.   Seen by interventional radiology and the plan is kyphoplasty today. Neurosurgery who recommended TSLO brace.    Acute kidney injury on CKD IIIb. Hold NSAIDs and Lasix for now. Creatinine today at 1.4.  Hypokalemia Replenished and improved.    Essential hypertension Continue Lopressor.  Hold Lasix for now.  Blood pressure is stable  Anxiety and depression Continue citalopram, Elavil.  on decreased dose of Neurontin. Will closely monitor.  VTE Prophylaxis: Lovenox subcu  Code Status: Full code  Family Communication: I spoke with the patient's daughter on the phone and updated her about the clinical condition of the patient.  Status is: Inpatient  The patient will be inpatient because: Unsafe d/c plan, plan for kyphoplasty, ambulatory dysfunction, IV analgesia  Dispo: The patient is from: Home              Anticipated d/c is to: Likely home               Anticipated d/c date is: 2 days              Patient currently is not medically stable to d/c.  Consultants:  Interventional Radiology  Procedures:  none  Antibiotics:  . None  Anti-infectives (From admission, onward)   None     Subjective: Today, patient was seen and examined at bedside.  Complains of back pain especially on movement of the back.  Denies any shortness of breath, chest pain, palpitation.  Objective: Vitals:   11/04/19 0457 11/04/19 0548  BP: 112/62 119/74  Pulse: 69 74  Resp: 16 18  Temp: 97.7 F (  36.5 C) 98 F (36.7 C)  SpO2: 99% 100%    Intake/Output Summary (Last 24 hours) at 11/04/2019 1103 Last data filed at 11/04/2019 0900 Gross per 24 hour  Intake 960 ml  Output 1250 ml  Net -290 ml   Filed Weights   11/02/19 0219  Weight: 88.5 kg   Body mass index is 33.47 kg/m.   Physical Exam: GENERAL: Patient is alert awake and oriented. Not in obvious distress.  Obese HENT: No scleral  pallor or icterus. Pupils equally reactive to light. Oral mucosa is moist NECK: is supple, no gross swelling noted. CHEST: Clear to auscultation. No crackles or wheezes.  Diminished breath sounds bilaterally.  Tenderness of the lumbar spine on palpation. CVS: S1 and S2 heard, no murmur. Regular rate and rhythm.  ABDOMEN: Soft, non-tender, bowel sounds are present. EXTREMITIES: No edema. CNS: Cranial nerves are intact.  Moving extremities. SKIN: warm and dry without rashes.  Data Review: I have personally reviewed the following laboratory data and studies,  CBC: Recent Labs  Lab 11/01/19 1352 11/02/19 0244 11/03/19 0235 11/04/19 0307  WBC 6.9 6.5 6.5 6.6  NEUTROABS 3.0  --   --   --   HGB 11.6* 11.0* 9.8* 10.0*  HCT 38.0 35.8* 33.1* 32.9*  MCV 90.7 90.9 92.2 92.7  PLT 311 292 300 295   Basic Metabolic Panel: Recent Labs  Lab 11/01/19 1352 11/02/19 0244 11/03/19 0235 11/04/19 0307  NA 141 143 139 135  K 3.1* 3.7 4.5 4.5  CL 104 104 105 103  CO2 25 30 26 24   GLUCOSE 93 102* 93 97  BUN 15 14 13 13   CREATININE 1.43* 1.25* 1.40* 1.42*  CALCIUM 9.0 9.1 8.6* 8.5*  MG  --   --  2.2 2.1  PHOS  --   --  3.6  --    Liver Function Tests: Recent Labs  Lab 11/03/19 0235 11/04/19 0307  AST 12* 12*  ALT 9 10  ALKPHOS 54 54  BILITOT 0.5 0.7  PROT 6.3* 6.1*  ALBUMIN 3.5 3.4*   No results for input(s): LIPASE, AMYLASE in the last 168 hours. No results for input(s): AMMONIA in the last 168 hours. Cardiac Enzymes: No results for input(s): CKTOTAL, CKMB, CKMBINDEX, TROPONINI in the last 168 hours. BNP (last 3 results) Recent Labs    05/10/19 0411  BNP 312.3*    ProBNP (last 3 results) No results for input(s): PROBNP in the last 8760 hours.  CBG: No results for input(s): GLUCAP in the last 168 hours. Recent Results (from the past 240 hour(s))  Respiratory Panel by RT PCR (Flu A&B, Covid) - Nasopharyngeal Swab     Status: None   Collection Time: 11/01/19  1:52 PM    Specimen: Nasopharyngeal Swab  Result Value Ref Range Status   SARS Coronavirus 2 by RT PCR NEGATIVE NEGATIVE Final    Comment: (NOTE) SARS-CoV-2 target nucleic acids are NOT DETECTED. The SARS-CoV-2 RNA is generally detectable in upper respiratoy specimens during the acute phase of infection. The lowest concentration of SARS-CoV-2 viral copies this assay can detect is 131 copies/mL. A negative result does not preclude SARS-Cov-2 infection and should not be used as the sole basis for treatment or other patient management decisions. A negative result may occur with  improper specimen collection/handling, submission of specimen other than nasopharyngeal swab, presence of viral mutation(s) within the areas targeted by this assay, and inadequate number of viral copies (<131 copies/mL). A negative result must be combined with clinical  observations, patient history, and epidemiological information. The expected result is Negative. Fact Sheet for Patients:  https://www.moore.com/ Fact Sheet for Healthcare Providers:  https://www.young.biz/ This test is not yet ap proved or cleared by the [place] FDA and  has been authorized for detection and/or diagnosis of SARS-CoV-2 by FDA under an Emergency Use Authorization (EUA). This EUA will remain  in effect (meaning this test can be used) for the duration of the COVID-19 declaration under Section 564(b)(1) of the Act, 21 U.S.C. section 360bbb-3(b)(1), unless the authorization is terminated or revoked sooner.    Influenza A by PCR NEGATIVE NEGATIVE Final   Influenza B by PCR NEGATIVE NEGATIVE Final    Comment: (NOTE) The Xpert Xpress SARS-CoV-2/FLU/RSV assay is intended as an aid in  the diagnosis of influenza from Nasopharyngeal swab specimens and  should not be used as a sole basis for treatment. Nasal washings and  aspirates are unacceptable for Xpert Xpress SARS-CoV-2/FLU/RSV  testing. Fact Sheet  for Patients: https://www.moore.com/ Fact Sheet for Healthcare Providers: https://www.young.biz/ This test is not yet approved or cleared by the [place] FDA and  has been authorized for detection and/or diagnosis of SARS-CoV-2 by  FDA under an Emergency Use Authorization (EUA). This EUA will remain  in effect (meaning this test can be used) for the duration of the  Covid-19 declaration under Section 564(b)(1) of the Act, 21  U.S.C. section 360bbb-3(b)(1), unless the authorization is  terminated or revoked. Performed at [facility], [address] [place], [place] [address]   Surgical pcr screen     Status: Abnormal   Collection Time: 11/04/19  5:51 AM   Specimen: Nasal Mucosa; Nasal Swab  Result Value Ref Range Status   MRSA, PCR POSITIVE (A) NEGATIVE Final    Comment: RESULT CALLED TO, READ BACK BY AND VERIFIED WITH: Carey Bullocks RN [id number] 11/04/19 JM    Staphylococcus aureus POSITIVE (A) NEGATIVE Final    Comment: (NOTE) The Xpert SA Assay (FDA approved for NASAL specimens in patients 20 years of age and older), is one component of a comprehensive surveillance program. It is not intended to diagnose infection nor to guide or monitor treatment. Performed at [facility], [address] [place], [place] [address]      Studies: No results found.   Joycelyn Das, MD  [facility] 11/04/2019

## 2019-11-05 MED ORDER — ALBUTEROL SULFATE HFA 108 (90 BASE) MCG/ACT IN AERS: 2.0000 | INHALATION_SPRAY | RESPIRATORY_TRACT | Status: DC | PRN

## 2019-11-05 MED ORDER — ALBUTEROL SULFATE (2.5 MG/3ML) 0.083% IN NEBU
2.5000 mg | INHALATION_SOLUTION | RESPIRATORY_TRACT | Status: DC | PRN
  Administered 2019-11-05: 2.5 mg via RESPIRATORY_TRACT
  Filled 2019-11-05: qty 3

## 2019-11-05 MED ORDER — GABAPENTIN 300 MG PO CAPS: 600.0000 mg | ORAL_CAPSULE | Freq: Two times a day (BID) | ORAL | Status: DC

## 2019-11-05 MED ORDER — LIDOCAINE 5 % EX PTCH: 1.0000 | MEDICATED_PATCH | Freq: Two times a day (BID) | CUTANEOUS | 0 refills | Status: DC

## 2019-11-05 MED ORDER — GABAPENTIN 400 MG PO CAPS: 800.0000 mg | ORAL_CAPSULE | Freq: Two times a day (BID) | ORAL | Status: DC

## 2019-11-05 MED ORDER — VITAMIN B-12 1000 MCG PO TABS
500.0000 ug | ORAL_TABLET | Freq: Every day | ORAL | Status: DC
  Administered 2019-11-05: 500 ug via ORAL
  Filled 2019-11-05: qty 1

## 2019-11-05 MED ORDER — SENNOSIDES-DOCUSATE SODIUM 8.6-50 MG PO TABS: 1.0000 | ORAL_TABLET | Freq: Every evening | ORAL | 0 refills | Status: DC | PRN

## 2019-11-05 MED ORDER — GABAPENTIN 300 MG PO CAPS: 600.0000 mg | ORAL_CAPSULE | Freq: Two times a day (BID) | ORAL | 2 refills | Status: DC

## 2019-11-05 NOTE — Plan of Care (Signed)
  Problem: Clinical Measurements: Goal: Respiratory complications will improve Outcome: Progressing   Problem: Clinical Measurements: Goal: Cardiovascular complication will be avoided Outcome: Progressing   Problem: Activity: Goal: Risk for activity intolerance will decrease Outcome: Progressing   Problem: Pain Managment: Goal: General experience of comfort will improve Outcome: Progressing   Problem: Safety: Goal: Ability to remain free from injury will improve Outcome: Progressing   

## 2019-11-05 NOTE — Evaluation (Signed)
Occupational Therapy Evaluation Patient Details Name: Melinda [name] MRN: [id number] DOB: Oct 20, 1950 Today's Date: 11/05/2019    History of Present Illness 69 y.o. female with medical history significant for hypertension, CKD stage III, osteoarthritis, chronic back pain, and anxiety/depression who presents to the ED for evaluation of back pain after a fall at home. Dx of L1 fx, s/p KP 11/04/19.   Clinical Impression   Melinda [name] is a 69 year old woman s/p kyphoplasty who presents with functional upper extremity strength for ADLs but limited for functional mobility, lower extremity weakness, poor activity tolerance and complaints of pain. At the time of evaluation patient unable to take steps and transfer with one person assist and is total assistance for lower body ADLs and toileting. Discussed discharge plan with patient for an extended amount of time and patient wants to go home and try [facility] therapy with the assistance of her son as she reports a bad experience with SNF placement in the past.     Follow Up Recommendations  Home health OT(Therapist recommende short term rehab to patient. Patient wants to go home. Long and extensive converstion about discharge plan. Patient reports she has all the DME needed and 24/7 assistance from her son. Has no steps to enter home.)    Equipment Recommendations       Recommendations for Other Services       Precautions / Restrictions Precautions Precautions: Back Precaution Booklet Issued: Yes (comment) Required Braces or Orthoses: Spinal Brace Spinal Brace: Thoracolumbosacral orthotic Restrictions Weight Bearing Restrictions: No      Mobility Bed Mobility  unable to assess                Transfers       Sit to Stand: Mod assist         General transfer comment: Patient seated in recliner. Patient able to stand from recliner with mod assist. Patient unable to perform steps or stand pivot with one person assist. Patient able  to stand for approx 20 seconds holding onto walker. Bilateral lower extremities tremulous.    Balance   Sitting-balance support: No upper extremity supported;Feet supported       Standing balance support: Bilateral upper extremity supported Standing balance-Leahy Scale: Poor                             ADL either performed or assessed with clinical judgement   ADL Overall ADL's : Needs assistance/impaired Eating/Feeding: Set up   Grooming: Set up   Upper Body Bathing: Set up   Lower Body Bathing: Sit to/from stand;Moderate assistance Lower Body Bathing Details (indicate cue type and reason): Patient able to reach thighs - unable to bend over to reach lower legs. Discussed need for AE for bathing. Upper Body Dressing : Set up;Sitting Upper Body Dressing Details (indicate cue type and reason): patient can perform ADLs from seated position. Lower Body Dressing: Sit to/from stand;Adhering to back precautions;Total assistance Lower Body Dressing Details (indicate cue type and reason): unable to donn socks, unable to get ankle across knee to compensate. Discussed need for AE to compensate.   Toilet Transfer Details (indicate cue type and reason): unable Toileting- Clothing Manipulation and Hygiene: Sit to/from stand;Total assistance Toileting - Clothing Manipulation Details (indicate cue type and reason): Patient needs to hold onto walker with standing - unable to manage clothing/wiping.   Tub/Shower Transfer Details (indicate cue type and reason): unable   General ADL Comments:  Patient sitting in recliner. Patient mod assist to stand from recliner. Patient unable to take steps with one person assist.     Vision Baseline Vision/History: Wears glasses Vision Assessment?: No apparent visual deficits     Perception     Praxis      Pertinent Vitals/Pain Pain Assessment: 0-10 Pain Score: 8  Pain Location: back Pain Descriptors / Indicators: Aching Pain  Intervention(s): Monitored during session     Hand Dominance     Extremity/Trunk Assessment Upper Extremity Assessment Upper Extremity Assessment: Overall WFL for tasks assessed   Lower Extremity Assessment Lower Extremity Assessment: Defer to PT evaluation   Cervical / Trunk Assessment Cervical / Trunk Assessment: Normal   Communication Communication Communication: No difficulties   Cognition Arousal/Alertness: Awake/alert Behavior During Therapy: WFL for tasks assessed/performed Overall Cognitive Status: Within Functional Limits for tasks assessed                                     General Comments       Exercises     Shoulder Instructions      Home Living Family/patient expects to be discharged to:: Private residence Living Arrangements: Alone Available Help at Discharge: Available PRN/intermittently Type of Home: Apartment Home Access: Elevator     Home Layout: One level     Bathroom Shower/Tub: Producer, television/film/video: Standard Bathroom Accessibility: Yes   Home Equipment: Environmental consultant - 4 wheels;Bedside commode          Prior Functioning/Environment Level of Independence: Independent with assistive device(s)        Comments: Uses rollator for short distances. Reports multiple falls. Reports independence with ADLs with DME.        OT Problem List: Decreased strength;Decreased activity tolerance;Impaired balance (sitting and/or standing);Decreased knowledge of precautions;Decreased knowledge of use of DME or AE;Pain;Obesity      OT Treatment/Interventions: Self-care/ADL training;Therapeutic activities;DME and/or AE instruction;Balance training;Patient/family education    OT Goals(Current goals can be found in the care plan section) Acute Rehab OT Goals Patient Stated Goal: to be able to get to [facility] OT Goal Formulation: With patient Time For Goal Achievement: 11/26/19 Potential to Achieve Goals: Fair  OT Frequency: Min  2X/week   Barriers to D/C:            Co-evaluation              AM-PAC OT "6 Clicks" Daily Activity     Outcome Measure Help from another person eating meals?: None Help from another person taking care of personal grooming?: A Little Help from another person toileting, which includes using toliet, bedpan, or urinal?: Total Help from another person bathing (including washing, rinsing, drying)?: A Lot Help from another person to put on and taking off regular upper body clothing?: A Little Help from another person to put on and taking off regular lower body clothing?: Total 6 Click Score: 14   End of Session Equipment Utilized During Treatment: Gait belt;Rolling walker;Back brace Nurse Communication: Mobility status  Activity Tolerance: Patient limited by pain;Other (comment)(Patient limited by LE weakness and tremulous LEs.) Patient left:    OT Visit Diagnosis: Unsteadiness on feet (R26.81);Repeated falls (R29.6);Muscle weakness (generalized) (M62.81);Pain                Time: 1600-1630 OT Time Calculation (min): 30 min Charges:  OT General Charges $OT Visit: 1 Visit OT Evaluation $OT Eval Moderate Complexity:  1 Mod  Keston Seever, OTR/L Acute Care Rehab Services  Office [phone]   Kelli Churn 11/05/2019, 4:48 PM

## 2019-11-05 NOTE — Progress Notes (Signed)
Pt d/c home in stable condition with family. No needs at time of d/c. Pt had back brace in place. dme home with pt. Home health arranged.

## 2019-11-05 NOTE — Progress Notes (Signed)
Referring Physician(s): Dr Marya Amsler  Supervising Physician: Malachy Moan  Patient Status:  [facility] - In-pt  Chief Complaint:  Back pain L1 VP in IR 5/7  Subjective:  Post L1 VP 5/7 in IR Up in chair eating normal diet States she is able to move around with less pain Still very weak in legs   Allergies: Patient has no known allergies.  Medications: Prior to Admission medications   Medication Sig Start Date End Date Taking? Authorizing Provider  acetaminophen (TYLENOL) 500 MG tablet Take 1,000 mg by mouth every 8 (eight) hours. For Pain   Yes [provider]  amitriptyline (ELAVIL) 150 MG tablet Take 1 tablet (150 mg total) by mouth at bedtime. 09/26/19  Yes Margit Hanks, MD  ascorbic acid (VITAMIN C) 500 MG tablet Take 1 tablet (500 mg total) by mouth 2 (two) times daily. 09/26/19  Yes Margit Hanks, MD  aspirin EC 81 MG tablet Take 81 mg by mouth daily.   Yes [provider]  carboxymethylcellulose (REFRESH PLUS) 0.5 % SOLN Place 2 drops into both eyes 2 (two) times daily as needed (dry eyes).    Yes [provider]  citalopram (CELEXA) 20 MG tablet Take 1 tablet (20 mg total) by mouth daily. 10/05/19  Yes [name], [name] C, NP  [employer] D3 50 MCG (2000 UT) CAPS TAKE 1 CAPSULE BY MOUTH EVERY DAY 10/25/19  Yes [name], [name] C, NP  fluticasone (FLONASE) 50 MCG/ACT nasal spray PLACE 2 SPRAYS INTO BOTH NOSTRILS DAILY AS NEEDED FOR ALLERGIES OR RHINITIS. 10/25/19  Yes [name], [name] C, NP  furosemide (LASIX) 40 MG tablet Take 1 tablet (40 mg total) by mouth daily. 09/26/19  Yes Margit Hanks, MD  gabapentin (NEURONTIN) 400 MG capsule Take 2 capsules (800 mg total) by mouth 2 (two) times daily. POLYNEUROPATHY 10/05/19  Yes [name], [name] C, NP  loratadine (CLARITIN) 10 MG tablet TAKE 1 TABLET BY MOUTH EVERY DAY 10/25/19  Yes [name], [name] C, NP  metoprolol tartrate (LOPRESSOR) 50 MG tablet TAKE ONE TABLET BY MOUTH TWICE DAILY FOR BLOOD PRESSURE 10/10/19   Yes [name], [name] C, NP  oxyCODONE (ROXICODONE) 15 MG immediate release tablet Take 1 tablet (15 mg total) by mouth every 8 (eight) hours as needed for pain. 10/05/19  Yes [name], [name] C, NP  SUMAtriptan (IMITREX) 25 MG tablet TAKE 1 TAB EVERY 2 HRS AS NEEDED FOR MIGRAINE OR HEADACHE. MAY REPEAT IN 2 HRS IF PERSISTS/RECURS. 09/26/19  Yes Margit Hanks, MD  vitamin B-12 (CYANOCOBALAMIN) 500 MCG tablet Take 1 tablet (500 mcg total) by mouth daily. 09/26/19  Yes Margit Hanks, MD     Vital Signs: BP (!) 96/52 (BP Location: Right Arm)   Pulse 76   Temp 97.6 F (36.4 C) (Oral)   Resp 18   Ht  (1.626 m)   Wt 195 lb (88.5 kg)   SpO2 100%   BMI 33.47 kg/m   Physical Exam Vitals reviewed.  Musculoskeletal:        General: Normal range of motion.     Comments: Sitting up in chair and moving all 4s   Skin:    General: Skin is warm and dry.     Comments: Site is clean and dry Tender to touch most of spine up and down     Neurological:     Mental Status: She is alert and oriented to person, place, and time.  Psychiatric:  Behavior: Behavior normal.     Imaging: CT Head Wo Contrast  Result Date: 11/01/2019 CLINICAL DATA:  Acute pain due to trauma. Fatigue and frequent falls. EXAM: CT HEAD WITHOUT CONTRAST TECHNIQUE: Contiguous axial images were obtained from the base of the skull through the vertex without intravenous contrast. COMPARISON:  CT head dated 10/10/2017. FINDINGS: Brain: No evidence of acute infarction, hemorrhage, hydrocephalus, extra-axial collection or mass lesion/mass effect. Vascular: No hyperdense vessel or unexpected calcification. Skull: Normal. Negative for fracture or focal lesion. Sinuses/Orbits: No acute finding. Other: None. IMPRESSION: No acute intracranial process. Electronically Signed   By: Katherine Mantle M.D.   On: 11/01/2019 15:09   MR LUMBAR SPINE WO CONTRAST  Result Date: 11/01/2019 CLINICAL DATA:  Initial evaluation for low back  pain, recent falls. EXAM: MRI LUMBAR SPINE WITHOUT CONTRAST TECHNIQUE: Multiplanar, multisequence MR imaging of the lumbar spine was performed. No intravenous contrast was administered. COMPARISON:  Prior MRI from 11/21/2017. FINDINGS: Segmentation: Standard. Lowest well-formed disc space labeled the L5-S1 level. Alignment: Physiologic with preservation of the normal lumbar lordosis. No listhesis. Vertebrae: Subtle linear T1 hypointensity the with mild marrow edema seen extending through the central and right aspect of the superior endplate of T1, suspicious for an acute to subacute compression fracture (series 5, image 9). Superimposed endplate Schmorl's node deformity noted. Relatively mild height loss of up to approximately 30% without bony retropulsion. This is benign/mechanical in appearance. Vertebral body height otherwise maintained without evidence for acute or chronic fracture. Bone marrow signal intensity within normal limits. No discrete or worrisome osseous lesions. No other abnormal marrow edema. Conus medullaris and cauda equina: Conus extends to the T12-L1 level. Conus and cauda equina appear normal. Paraspinal and other soft tissues: Paraspinous soft tissues within normal limits. Simple exophytic cyst seen extending from the lower pole the left kidney. Visualized visceral structures otherwise unremarkable. Disc levels: T12-L1: Disc desiccation without significant disc bulge. No canal or foraminal stenosis. L1-2: Negative interspace. Mild bilateral facet hypertrophy, greater on the right. No stenosis. L2-3: Disc desiccation without disc bulge. Mild bilateral facet hypertrophy. No significant canal or foraminal stenosis. L3-4: Disc desiccation without significant disc bulge. Mild to moderate facet hypertrophy. No significant canal or foraminal stenosis. L4-5: Negative interspace. Moderate facet and ligament flavum hypertrophy. Mild spinal stenosis, relatively stable. Foramina remain patent. L5-S1:  Negative interspace. Mild to moderate facet hypertrophy. No significant canal or lateral recess stenosis. Foramina remain patent. IMPRESSION: 1. Interval height loss with mild marrow edema involving the superior endplate of L1, suspicious for an acute to subacute compression fracture. Height loss is mild measuring no more than 30% without bony retropulsion. 2. No other acute abnormality within the lumbar spine. 3. Mild-to-moderate multilevel facet hypertrophy without significant spinal stenosis, most pronounced at L4-5. Electronically Signed   By: Rise Mu M.D.   On: 11/01/2019 23:52   DG Chest Port 1 View  Result Date: 11/01/2019 CLINICAL DATA:  Fatigue, falls EXAM: PORTABLE CHEST 1 VIEW COMPARISON:  09/20/2019 FINDINGS: The heart size and mediastinal contours are stable. Persistent elevation of the left hemidiaphragm. Chronic linear scarring or atelectasis in the left mid lung. No new focal airspace consolidation. No sizable pleural fluid collection. No pneumothorax. The visualized skeletal structures are unremarkable. IMPRESSION: No active disease. Electronically Signed   By: Duanne Guess D.O.   On: 11/01/2019 14:31   IR KYPHO LUMBAR INC FX REDUCE BONE BX UNI/BIL CANNULATION INC/IMAGING  Result Date: 11/04/2019 CLINICAL DATA:  Symptomatic L1 compression fracture. Please perform image guided  kyphoplasty for symptomatic purposes. Please refer to formal consultation in the epic EMR dated 11/02/2019 for additional details. EXAM: FLUOROSCOPIC GUIDED KYPHOPLASTY OF THE L1 VERTEBRAL BODY COMPARISON:  Lumbar spine MRI-11/04/2019 MEDICATIONS: As antibiotic prophylaxis, Ancef 2 gm IV was ordered pre-procedure and administered intravenously within 1 hour of incision. ANESTHESIA/SEDATION: Moderate (conscious) sedation was employed during this procedure. A total of Versed 2.5 mg and Fentanyl 100 mcg was administered intravenously. Moderate Sedation Time: 25 minutes. The patient's level of consciousness  and vital signs were monitored continuously by radiology nursing throughout the procedure under my direct supervision. FLUOROSCOPY TIME:  7 minutes, 48 seconds (660 mGy) COMPLICATIONS: None immediate. TECHNIQUE: The procedure, risks (including but not limited to bleeding, infection, organ damage), benefits, and alternatives were explained to the patient. Questions regarding the procedure were encouraged and answered. The patient understands and consents to the procedure. The patient was placed prone on the fluoroscopic table. The skin overlying the upper thoracic region was then prepped and draped in the usual sterile fashion. Maximal barrier sterile technique was utilized including caps, mask, sterile gowns, sterile gloves, sterile drape, hand hygiene and skin antiseptic. Intravenous Fentanyl and Versed were administered as conscious sedation during continuous cardiorespiratory monitoring by the radiology RN. The left pedicle at L1 was then infiltrated with 1% lidocaine followed by the advancement of a Kyphon trocar needle through the left pedicle into the posterior one-third of the vertebral body. Subsequently, the osteo drill was advanced to the anterior third of the vertebral body. The osteo drill was retracted. Through the working cannula, a Kyphon inflatable bone tamp 15 x 2 was advanced and positioned with the distal marker approximately 5 mm from the anterior aspect of the cortex. Appropriate positioning was confirmed on the AP projection. At this time, the balloon was expanded using contrast via a Kyphon inflation syringe device via micro tubing. In similar fashion, the right L1 pedicle was infiltrated with 1% lidocaine followed by the advancement of a second Kyphon trocar needle through the right pedicle into the posterior third of the vertebral body. Subsequently, the osteo drill was coaxially advanced to the anterior right third. The osteo drill was exchanged for a Kyphon inflatable bone tamp 15 x 2,  advanced to the 5 mm of the anterior aspect of the cortex. The balloon was then expanded using contrast as above. Inflations were continued until there was near apposition with the superior end plate. At this time, methylmethacrylate mixture was reconstituted in the Kyphon bone mixing device system. This was then loaded into the delivery mechanism, attached to Kyphon bone fillers. The balloons were deflated and removed followed by the instillation of methylmethacrylate mixture with excellent filling in the AP and lateral projections. No extravasation was noted in the disk spaces, however a small amount cement is seen extending posteriorly to the level of the spinal. No epidural venous contamination was seen. The working cannulae and the bone filler were then retrieved and removed. Hemostasis was achieved with manual compression. The patient tolerated the procedure well without immediate postprocedural complication. IMPRESSION: 1. Technically successful L1 vertebral body augmentation using balloon kyphoplasty. 2. Per CMS PQRS reporting requirements (PQRS Measure 24): Given the patient's age of greater than 50 and the fracture site (hip, distal radius, or spine), the patient should be tested for osteoporosis using DXA, and the appropriate treatment considered based on the DXA results. Electronically Signed   By: Simonne Come M.D.   On: 11/04/2019 16:29   VAS [place] LOWER EXTREMITY VENOUS (DVT) (MC and  WL 7a-7p)  Result Date: 11/01/2019  Lower Venous DVTStudy Indications: Swelling.  Risk Factors: None identified. Limitations: Body habitus and poor ultrasound/tissue interface. Comparison Study: No prior studies. Performing Technologist: Oliver Hum RVT  Examination Guidelines: A complete evaluation includes B-mode imaging, spectral Doppler, color Doppler, and power Doppler as needed of all accessible portions of each vessel. Bilateral testing is considered an integral part of a complete examination. Limited  examinations for reoccurring indications may be performed as noted. The reflux portion of the exam is performed with the patient in reverse Trendelenburg.  +---------+---------------+---------+-----------+----------+--------------+ RIGHT    CompressibilityPhasicitySpontaneityPropertiesThrombus Aging +---------+---------------+---------+-----------+----------+--------------+ CFV      Full           Yes      Yes                                 +---------+---------------+---------+-----------+----------+--------------+ SFJ      Full                                                        +---------+---------------+---------+-----------+----------+--------------+ FV Prox  Full                                                        +---------+---------------+---------+-----------+----------+--------------+ FV Mid   Full                                                        +---------+---------------+---------+-----------+----------+--------------+ FV DistalFull                                                        +---------+---------------+---------+-----------+----------+--------------+ PFV      Full                                                        +---------+---------------+---------+-----------+----------+--------------+ POP      Full           Yes      Yes                                 +---------+---------------+---------+-----------+----------+--------------+ PTV      Full                                                        +---------+---------------+---------+-----------+----------+--------------+ PERO     Full                                                        +---------+---------------+---------+-----------+----------+--------------+   +---------+---------------+---------+-----------+----------+--------------+  LEFT     CompressibilityPhasicitySpontaneityPropertiesThrombus Aging  +---------+---------------+---------+-----------+----------+--------------+ CFV      Full           Yes      Yes                                 +---------+---------------+---------+-----------+----------+--------------+ SFJ      Full                                                        +---------+---------------+---------+-----------+----------+--------------+ FV Prox  Full                                                        +---------+---------------+---------+-----------+----------+--------------+ FV Mid   Full                                                        +---------+---------------+---------+-----------+----------+--------------+ FV DistalFull                                                        +---------+---------------+---------+-----------+----------+--------------+ PFV      Full                                                        +---------+---------------+---------+-----------+----------+--------------+ POP      Full           Yes      Yes                                 +---------+---------------+---------+-----------+----------+--------------+ PTV      Full                                                        +---------+---------------+---------+-----------+----------+--------------+ PERO     Full                                                        +---------+---------------+---------+-----------+----------+--------------+     Summary: RIGHT: - There is no evidence of deep vein thrombosis in the lower extremity. However, portions of this examination were limited- see technologist comments above.  - No cystic structure found in the popliteal fossa.  LEFT: - There is no evidence  of deep vein thrombosis in the lower extremity. However, portions of this examination were limited- see technologist comments above.  - No cystic structure found in the popliteal fossa.  *See table(s) above for measurements and observations.  Electronically signed by Waverly Ferrari MD on 11/01/2019 at 4:30:17 PM.    Final     Labs:  CBC: Recent Labs    11/01/19 1352 11/02/19 0244 11/03/19 0235 11/04/19 0307  WBC 6.9 6.5 6.5 6.6  HGB 11.6* 11.0* 9.8* 10.0*  HCT 38.0 35.8* 33.1* 32.9*  PLT 311 292 300 305    COAGS: Recent Labs    11/03/19 0235  INR 1.1    BMP: Recent Labs    11/01/19 1352 11/02/19 0244 11/03/19 0235 11/04/19 0307  NA 141 143 139 135  K 3.1* 3.7 4.5 4.5  CL 104 104 105 103  CO2 25 30 26 24   GLUCOSE 93 102* 93 97  BUN 15 14 13 13   CALCIUM 9.0 9.1 8.6* 8.5*  CREATININE 1.43* 1.25* 1.40* 1.42*  GFRNONAA 38* 44* 39* 38*  GFRAA 44* 51* 45* 44*    LIVER FUNCTION TESTS: Recent Labs    09/12/19 1240 10/05/19 0942 11/03/19 0235 11/04/19 0307  BILITOT 0.6 0.7 0.5 0.7  AST 17 15 12* 12*  ALT 16 19 9 10   ALKPHOS 71  --  54 54  PROT 7.6 7.0 6.3* 6.1*  ALBUMIN 4.2  --  3.5 3.4*    Assessment and Plan:  L1 VP 5/7 in IR Less pain today Weak legs per pt-- not new symptom   Electronically Signed: 01/04/20, PA-C 11/05/2019, 11:18 AM   I spent a total of 15 Minutes at the the patient's bedside AND on the patient's hospital floor or unit, greater than 50% of which was counseling/coordinating care for L1 VP

## 2019-11-05 NOTE — Progress Notes (Signed)
Pt son arrived to floor. He is able to take mom home and provide support for her. Pt ok with this plan. Md contacted and pt to d/c home today.

## 2019-11-05 NOTE — TOC Progression Note (Signed)
Transition of Care [facility]) - Progression Note    Patient Details  Name: Melinda [name] MRN: [id number] Date of Birth: 07-24-1950  [facility] CM/SW Contact  Armanda Heritage, RN Phone Number: 11/05/2019, 2:07 PM  Clinical Narrative:    Patient set up with [facility] for HHPT/OT/RN.  [employer] to deliver rolling walker to bedside.    Expected Discharge Plan: Home w Home Health Services Barriers to Discharge: Continued Medical Work up  Expected Discharge Plan and Services Expected Discharge Plan: Home w Home Health Services In-house Referral: Clinical Social Work     Living arrangements for the past 2 months: Apartment                 DME Arranged: Dan Humphreys rolling DME Agency: [employer] Date DME Agency Contacted: 11/05/19 Time DME Agency Contacted: [phone] Representative spoke with at DME Agency: [place] HH Arranged: PT, OT, RN HH Agency: [facility] Date [facility] Agency Contacted: 11/05/19 Time HH Agency Contacted: [phone] Representative spoke with at [facility] Agency: Denyse Amass   Social Determinants of Health (SDOH) Interventions    Readmission Risk Interventions No flowsheet data found.

## 2019-11-05 NOTE — Discharge Summary (Signed)
Physician Discharge Summary  Melinda [name] YQI:[id number] DOB: 1950/09/21 DOA: 11/01/2019  PCP: Melinda Bookman, NP  Admit date: 11/01/2019 Discharge date: 11/05/2019  Admitted From: Home  Discharge disposition: Home    Recommendations for Outpatient Follow-Up:   . Follow up with your primary care provider in one to two weeks . Patient has been placed on TSLO brace and neurosurgery internal referral has been made.  Discharge Diagnosis:   Principal Problem:   Closed compression fracture of first lumbar vertebra (HCC) Active Problems:   HTN (hypertension)   Acute on chronic renal insufficiency   Anxiety and depression   Hypokalemia    Discharge Condition: Improved.  Diet recommendation: Low sodium, heart healthy.    Wound care: None.  Code status: Full.   History of Present Illness:   Melinda [name] a 69 y.o.femalewith medical history significant forhypertension, CKD stage III, osteoarthritis, chronic back pain, and anxiety/depression who presents to the ED for evaluation of back pain after a fall at home.History was limited from patient due to hypersomnolence after receiving medications in the ED and therefore majority of the history was obtained from EDP and chart review. Patient presented to the ED for evaluation of fatigue and falling at home over the last couple days.   ED Course: Initial vitals showed BP 132/77, pulse 62, RR 12, SPO2 100% on room air.Labs notable for WBC 6.9, hemoglobin 11.6, platelets 311,000, sodium 141, potassium 3.1, bicarb 25, BUN 15, creatinine 1.43, serum glucose 93. SARS-CoV-2 PCR is negative. Influenza A/B PCR's are negative. Portable chest x-ray is negative for focal consolidation, edema, or effusion. CT head without contrast is negative for acute intracranial process. MRI lumbar spine without contrast shows changes suggestive of acute to subacute L1 compression fracture without other acute abnormality within the lumbar spine. Mild  to moderate multilevel facet hypertrophy without significant spinal stenosis is noted, most pronounced at L4-5. Bilateral Lower extremity Dopplers were obtained and negative for DVT bilaterally.The hospitalist service was consulted to admit for further evaluation/management.   Hospital Course:   Following conditions were addressed during hospitalization as listed below,  Falls with fracture of L1 vertebrae Post fall with acute on chronic back pain secondary to acute/subacute compression fracture of the L1 vertebra. Patient did have CT scan followed by MRI of the back which showed marrow edema in vertebral compression fracture of L1.  Seen by interventional radiology and underwent kyphoplasty on 11/04/19. On TSLO brace as per neurosurgery. Will obtain followup referral with Neurosurgery as  outpatient.   Acute kidney injury on CKDIIIb. Advised against NSAIDs. Creatinine today at 1.4 at baseline. BMP as outpatient  Hypokalemia Replenished and improved.    Essential hypertension Continue Lopressor.  Monitor BP closely as outpatient.  Anxiety and depression Continue citalopram, Elavil.   Patient is on 800 mg twice daily of Neurontin at home.  Pharmacy recommends 600 mg twice daily. Will change on discharge.   Disposition.  At this time, patient is stable for disposition home with Home Health services.  Medical Consultants:    Interventional Radiology  Procedures:     L1 kyphoplasty on 11/04/19 Subjective:   Today, patient was seen and examined at bedside.  Seen sitting at the bedside.  Seen by physical therapy.  On TSLO brace. Has mild back pain.   Discharge Exam:   Vitals:   11/05/19 0634 11/05/19 1352  BP: (!) 96/52 112/75  Pulse: 76 76  Resp: 18 16  Temp: 97.6 F (36.4 C) 98.2 F (36.8 C)  SpO2: 100%    Vitals:   11/04/19 1805 11/04/19 2056 11/05/19 0634 11/05/19 1352  BP: 101/85 109/72 (!) 96/52 112/75  Pulse: 61 66 76 76  Resp: 17 18 18 16   Temp: 98 F  (36.7 C) 98.6 F (37 C) 97.6 F (36.4 C) 98.2 F (36.8 C)  TempSrc: Oral Oral Oral Oral  SpO2: 100% 100% 100%   Weight:      Height:        General: Alert awake, not in obvious distress, obese HENT: pupils equally reacting to light,  No scleral pallor or icterus noted. Oral mucosa is moist.  Chest:  Clear breath sounds.  Diminished breath sounds bilaterally. No crackles or wheezes.  CVS: S1 &S2 heard. No murmur.  Regular rate and rhythm. TSLO brace Abdomen: Soft, nontender, nondistended.  Bowel sounds are heard.   Extremities: No cyanosis, clubbing or edema.  Peripheral pulses are palpable. Psych: Alert, awake and oriented, normal mood CNS:  No cranial nerve deficits.  Power equal in all extremities.   Skin: Warm and dry.  No rashes noted.  The results of significant diagnostics from this hospitalization (including imaging, microbiology, ancillary and laboratory) are listed below for reference.     Diagnostic Studies:   CT Head Wo Contrast  Result Date: 11/01/2019 CLINICAL DATA:  Acute pain due to trauma. Fatigue and frequent falls. EXAM: CT HEAD WITHOUT CONTRAST TECHNIQUE: Contiguous axial images were obtained from the base of the skull through the vertex without intravenous contrast. COMPARISON:  CT head dated 10/10/2017. FINDINGS: Brain: No evidence of acute infarction, hemorrhage, hydrocephalus, extra-axial collection or mass lesion/mass effect. Vascular: No hyperdense vessel or unexpected calcification. Skull: Normal. Negative for fracture or focal lesion. Sinuses/Orbits: No acute finding. Other: None. IMPRESSION: No acute intracranial process. Electronically Signed   By: Katherine Mantle M.D.   On: 11/01/2019 15:09   MR LUMBAR SPINE WO CONTRAST  Result Date: 11/01/2019 CLINICAL DATA:  Initial evaluation for low back pain, recent falls. EXAM: MRI LUMBAR SPINE WITHOUT CONTRAST TECHNIQUE: Multiplanar, multisequence MR imaging of the lumbar spine was performed. No intravenous  contrast was administered. COMPARISON:  Prior MRI from 11/21/2017. FINDINGS: Segmentation: Standard. Lowest well-formed disc space labeled the L5-S1 level. Alignment: Physiologic with preservation of the normal lumbar lordosis. No listhesis. Vertebrae: Subtle linear T1 hypointensity the with mild marrow edema seen extending through the central and right aspect of the superior endplate of T1, suspicious for an acute to subacute compression fracture (series 5, image 9). Superimposed endplate Schmorl's node deformity noted. Relatively mild height loss of up to approximately 30% without bony retropulsion. This is benign/mechanical in appearance. Vertebral body height otherwise maintained without evidence for acute or chronic fracture. Bone marrow signal intensity within normal limits. No discrete or worrisome osseous lesions. No other abnormal marrow edema. Conus medullaris and cauda equina: Conus extends to the T12-L1 level. Conus and cauda equina appear normal. Paraspinal and other soft tissues: Paraspinous soft tissues within normal limits. Simple exophytic cyst seen extending from the lower pole the left kidney. Visualized visceral structures otherwise unremarkable. Disc levels: T12-L1: Disc desiccation without significant disc bulge. No canal or foraminal stenosis. L1-2: Negative interspace. Mild bilateral facet hypertrophy, greater on the right. No stenosis. L2-3: Disc desiccation without disc bulge. Mild bilateral facet hypertrophy. No significant canal or foraminal stenosis. L3-4: Disc desiccation without significant disc bulge. Mild to moderate facet hypertrophy. No significant canal or foraminal stenosis. L4-5: Negative interspace. Moderate facet and ligament flavum hypertrophy. Mild spinal stenosis, relatively stable.  Foramina remain patent. L5-S1: Negative interspace. Mild to moderate facet hypertrophy. No significant canal or lateral recess stenosis. Foramina remain patent. IMPRESSION: 1. Interval height  loss with mild marrow edema involving the superior endplate of L1, suspicious for an acute to subacute compression fracture. Height loss is mild measuring no more than 30% without bony retropulsion. 2. No other acute abnormality within the lumbar spine. 3. Mild-to-moderate multilevel facet hypertrophy without significant spinal stenosis, most pronounced at L4-5. Electronically Signed   By: Rise Mu M.D.   On: 11/01/2019 23:52   DG Chest Port 1 View  Result Date: 11/01/2019 CLINICAL DATA:  Fatigue, falls EXAM: PORTABLE CHEST 1 VIEW COMPARISON:  09/20/2019 FINDINGS: The heart size and mediastinal contours are stable. Persistent elevation of the left hemidiaphragm. Chronic linear scarring or atelectasis in the left mid lung. No new focal airspace consolidation. No sizable pleural fluid collection. No pneumothorax. The visualized skeletal structures are unremarkable. IMPRESSION: No active disease. Electronically Signed   By: Duanne Guess D.O.   On: 11/01/2019 14:31   VAS [place] LOWER EXTREMITY VENOUS (DVT) (MC and WL 7a-7p)  Result Date: 11/01/2019  Lower Venous DVTStudy Indications: Swelling.  Risk Factors: None identified. Limitations: Body habitus and poor ultrasound/tissue interface. Comparison Study: No prior studies. Performing Technologist: Chanda Busing RVT  Examination Guidelines: A complete evaluation includes B-mode imaging, spectral Doppler, color Doppler, and power Doppler as needed of all accessible portions of each vessel. Bilateral testing is considered an integral part of a complete examination. Limited examinations for reoccurring indications may be performed as noted. The reflux portion of the exam is performed with the patient in reverse Trendelenburg.  +---------+---------------+---------+-----------+----------+--------------+ RIGHT    CompressibilityPhasicitySpontaneityPropertiesThrombus Aging +---------+---------------+---------+-----------+----------+--------------+  CFV      Full           Yes      Yes                                 +---------+---------------+---------+-----------+----------+--------------+ SFJ      Full                                                        +---------+---------------+---------+-----------+----------+--------------+ FV Prox  Full                                                        +---------+---------------+---------+-----------+----------+--------------+ FV Mid   Full                                                        +---------+---------------+---------+-----------+----------+--------------+ FV DistalFull                                                        +---------+---------------+---------+-----------+----------+--------------+ PFV      Full                                                        +---------+---------------+---------+-----------+----------+--------------+  POP      Full           Yes      Yes                                 +---------+---------------+---------+-----------+----------+--------------+ PTV      Full                                                        +---------+---------------+---------+-----------+----------+--------------+ PERO     Full                                                        +---------+---------------+---------+-----------+----------+--------------+   +---------+---------------+---------+-----------+----------+--------------+ LEFT     CompressibilityPhasicitySpontaneityPropertiesThrombus Aging +---------+---------------+---------+-----------+----------+--------------+ CFV      Full           Yes      Yes                                 +---------+---------------+---------+-----------+----------+--------------+ SFJ      Full                                                        +---------+---------------+---------+-----------+----------+--------------+ FV Prox  Full                                                         +---------+---------------+---------+-----------+----------+--------------+ FV Mid   Full                                                        +---------+---------------+---------+-----------+----------+--------------+ FV DistalFull                                                        +---------+---------------+---------+-----------+----------+--------------+ PFV      Full                                                        +---------+---------------+---------+-----------+----------+--------------+ POP      Full           Yes      Yes                                 +---------+---------------+---------+-----------+----------+--------------+  PTV      Full                                                        +---------+---------------+---------+-----------+----------+--------------+ PERO     Full                                                        +---------+---------------+---------+-----------+----------+--------------+     Summary: RIGHT: - There is no evidence of deep vein thrombosis in the lower extremity. However, portions of this examination were limited- see technologist comments above.  - No cystic structure found in the popliteal fossa.  LEFT: - There is no evidence of deep vein thrombosis in the lower extremity. However, portions of this examination were limited- see technologist comments above.  - No cystic structure found in the popliteal fossa.  *See table(s) above for measurements and observations. Electronically signed by Waverly Ferrari MD on 11/01/2019 at 4:30:17 PM.    Final      Labs:   Basic Metabolic Panel: Recent Labs  Lab 11/01/19 1352 11/01/19 1352 11/02/19 0244 11/02/19 0244 11/03/19 0235 11/04/19 0307  NA 141  --  143  --  139 135  K 3.1*   < > 3.7   < > 4.5 4.5  CL 104  --  104  --  105 103  CO2 25  --  30  --  26 24  GLUCOSE 93  --  102*  --  93 97  BUN 15  --  14  --  13 13  CREATININE  1.43*  --  1.25*  --  1.40* 1.42*  CALCIUM 9.0  --  9.1  --  8.6* 8.5*  MG  --   --   --   --  2.2 2.1  PHOS  --   --   --   --  3.6  --    < > = values in this interval not displayed.   GFR Estimated Creatinine Clearance: 40.8 mL/min (A) (by C-G formula based on SCr of 1.42 mg/dL (H)). Liver Function Tests: Recent Labs  Lab 11/03/19 0235 11/04/19 0307  AST 12* 12*  ALT 9 10  ALKPHOS 54 54  BILITOT 0.5 0.7  PROT 6.3* 6.1*  ALBUMIN 3.5 3.4*   No results for input(s): LIPASE, AMYLASE in the last 168 hours. No results for input(s): AMMONIA in the last 168 hours. Coagulation profile Recent Labs  Lab 11/03/19 0235  INR 1.1    CBC: Recent Labs  Lab 11/01/19 1352 11/02/19 0244 11/03/19 0235 11/04/19 0307  WBC 6.9 6.5 6.5 6.6  NEUTROABS 3.0  --   --   --   HGB 11.6* 11.0* 9.8* 10.0*  HCT 38.0 35.8* 33.1* 32.9*  MCV 90.7 90.9 92.2 92.7  PLT 311 292 300 305   Cardiac Enzymes: No results for input(s): CKTOTAL, CKMB, CKMBINDEX, TROPONINI in the last 168 hours. BNP: Invalid input(s): POCBNP CBG: No results for input(s): GLUCAP in the last 168 hours. D-Dimer No results for input(s): DDIMER in the last 72 hours. Hgb A1c No results for input(s): HGBA1C in the last 72 hours. Lipid Profile No results  for input(s): CHOL, HDL, LDLCALC, TRIG, CHOLHDL, LDLDIRECT in the last 72 hours. Thyroid function studies No results for input(s): TSH, T4TOTAL, T3FREE, THYROIDAB in the last 72 hours.  Invalid input(s): FREET3 Anemia work up No results for input(s): VITAMINB12, FOLATE, FERRITIN, TIBC, IRON, RETICCTPCT in the last 72 hours. Microbiology Recent Results (from the past 240 hour(s))  Respiratory Panel by RT PCR (Flu A&B, Covid) - Nasopharyngeal Swab     Status: None   Collection Time: 11/01/19  1:52 PM   Specimen: Nasopharyngeal Swab  Result Value Ref Range Status   SARS Coronavirus 2 by RT PCR NEGATIVE NEGATIVE Final    Comment: (NOTE) SARS-CoV-2 target nucleic acids are  NOT DETECTED. The SARS-CoV-2 RNA is generally detectable in upper respiratoy specimens during the acute phase of infection. The lowest concentration of SARS-CoV-2 viral copies this assay can detect is 131 copies/mL. A negative result does not preclude SARS-Cov-2 infection and should not be used as the sole basis for treatment or other patient management decisions. A negative result may occur with  improper specimen collection/handling, submission of specimen other than nasopharyngeal swab, presence of viral mutation(s) within the areas targeted by this assay, and inadequate number of viral copies (<131 copies/mL). A negative result must be combined with clinical observations, patient history, and epidemiological information. The expected result is Negative. Fact Sheet for Patients:  https://www.moore.com/ Fact Sheet for Healthcare Providers:  https://www.young.biz/ This test is not yet ap proved or cleared by the [place] FDA and  has been authorized for detection and/or diagnosis of SARS-CoV-2 by FDA under an Emergency Use Authorization (EUA). This EUA will remain  in effect (meaning this test can be used) for the duration of the COVID-19 declaration under Section 564(b)(1) of the Act, 21 U.S.C. section 360bbb-3(b)(1), unless the authorization is terminated or revoked sooner.    Influenza A by PCR NEGATIVE NEGATIVE Final   Influenza B by PCR NEGATIVE NEGATIVE Final    Comment: (NOTE) The Xpert Xpress SARS-CoV-2/FLU/RSV assay is intended as an aid in  the diagnosis of influenza from Nasopharyngeal swab specimens and  should not be used as a sole basis for treatment. Nasal washings and  aspirates are unacceptable for Xpert Xpress SARS-CoV-2/FLU/RSV  testing. Fact Sheet for Patients: https://www.moore.com/ Fact Sheet for Healthcare Providers: https://www.young.biz/ This test is not yet approved or  cleared by the [place] FDA and  has been authorized for detection and/or diagnosis of SARS-CoV-2 by  FDA under an Emergency Use Authorization (EUA). This EUA will remain  in effect (meaning this test can be used) for the duration of the  Covid-19 declaration under Section 564(b)(1) of the Act, 21  U.S.C. section 360bbb-3(b)(1), unless the authorization is  terminated or revoked. Performed at [facility], [address] [place], [place] [address]   Surgical pcr screen     Status: Abnormal   Collection Time: 11/04/19  5:51 AM   Specimen: Nasal Mucosa; Nasal Swab  Result Value Ref Range Status   MRSA, PCR POSITIVE (A) NEGATIVE Final    Comment: RESULT CALLED TO, READ BACK BY AND VERIFIED WITH: Carey Bullocks RN [id number] 11/04/19 JM    Staphylococcus aureus POSITIVE (A) NEGATIVE Final    Comment: (NOTE) The Xpert SA Assay (FDA approved for NASAL specimens in patients 23 years of age and older), is one component of a comprehensive surveillance program. It is not intended to diagnose infection nor to guide or monitor treatment. Performed at [facility], [address]  [place], [place] [address]      Discharge Instructions:   Discharge Instructions    Ambulatory referral to Neurosurgery   Complete by: As directed    Spinal stenosis, L1 Lumbar fracture on TSLO brace   Diet - low sodium heart healthy   Complete by: As directed    Discharge instructions   Complete by: As directed    Follow up with your primary care physician in one week. Follow up with neurosurgery (referral placed) about the brace and spinal stenosis as outpatient in 2-3 weeks. Continue Physical therapy at home. Take a note that the dose of gabapentin has been decreased to 600 mg twice a day.   Increase activity slowly   Complete by: As directed      Allergies as of 11/05/2019   No Known Allergies     Medication List    TAKE these medications   acetaminophen 500 MG  tablet Commonly known as: TYLENOL Take 1,000 mg by mouth every 8 (eight) hours. For Pain   amitriptyline 150 MG tablet Commonly known as: ELAVIL Take 1 tablet (150 mg total) by mouth at bedtime.   ascorbic acid 500 MG tablet Commonly known as: VITAMIN C Take 1 tablet (500 mg total) by mouth 2 (two) times daily.   aspirin EC 81 MG tablet Take 81 mg by mouth daily.   carboxymethylcellulose 0.5 % Soln Commonly known as: REFRESH PLUS Place 2 drops into both eyes 2 (two) times daily as needed (dry eyes).   citalopram 20 MG tablet Commonly known as: CELEXA Take 1 tablet (20 mg total) by mouth daily.   CVS D3 50 MCG (2000 UT) Caps Generic drug: Cholecalciferol TAKE 1 CAPSULE BY MOUTH EVERY DAY   fluticasone 50 MCG/ACT nasal spray Commonly known as: FLONASE PLACE 2 SPRAYS INTO BOTH NOSTRILS DAILY AS NEEDED FOR ALLERGIES OR RHINITIS.   furosemide 40 MG tablet Commonly known as: LASIX Take 1 tablet (40 mg total) by mouth daily.   gabapentin 300 MG capsule Commonly known as: NEURONTIN Take 2 capsules (600 mg total) by mouth 2 (two) times daily. POLYNEUROPATHY What changed:   medication strength  how much to take   lidocaine 5 % Commonly known as: Lidoderm Place 1 patch onto the skin every 12 (twelve) hours. Remove & Discard patch within 12 hours or as directed by MD. To area of greatest pain in the back   loratadine 10 MG tablet Commonly known as: CLARITIN TAKE 1 TABLET BY MOUTH EVERY DAY   metoprolol tartrate 50 MG tablet Commonly known as: LOPRESSOR TAKE ONE TABLET BY MOUTH TWICE DAILY FOR BLOOD PRESSURE   oxyCODONE 15 MG immediate release tablet Commonly known as: ROXICODONE Take 1 tablet (15 mg total) by mouth every 8 (eight) hours as needed for pain.   senna-docusate 8.6-50 MG tablet Commonly known as: Senokot-S Take 1 tablet by mouth at bedtime as needed for mild constipation.   SUMAtriptan 25 MG tablet Commonly known as: IMITREX TAKE 1 TAB EVERY 2 HRS AS  NEEDED FOR MIGRAINE OR HEADACHE. MAY REPEAT IN 2 HRS IF PERSISTS/RECURS.   vitamin B-12 500 MCG tablet Commonly known as: CYANOCOBALAMIN Take 1 tablet (500 mcg total) by mouth daily.            Durable Medical Equipment  (From admission, onward)         Start     Ordered   11/05/19 1139  For home use only DME Walker rolling  Once    Question Answer  Comment  Walker: With 5 Inch Wheels   Patient needs a walker to treat with the following condition Sprain, lumbar, initial encounter      11/05/19 1139         Follow-up Information    [facility], [facility] Follow up.   Why: please contact to discuss the possibility of getting a scooter. Contact information: [address] [place] [address] [phone]        Care, [facility] Follow up.   Specialty: Home Health Services Why: agency will provide home health services. Contact information: [address] [place] [address] [phone]           Time coordinating discharge: 39 minutes  Signed:  Zevin Nevares  [facility] 11/05/2019, 4:10 PM
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
## 2019-11-05 NOTE — Plan of Care (Signed)
  Problem: Health Behavior/Discharge Planning: Goal: Ability to manage health-related needs will improve Outcome: Progressing   Problem: Clinical Measurements: Goal: Ability to maintain clinical measurements within normal limits will improve Outcome: Progressing Goal: Will remain free from infection Outcome: Progressing Goal: Diagnostic test results will improve Outcome: Progressing Goal: Respiratory complications will improve Outcome: Progressing Goal: Cardiovascular complication will be avoided Outcome: Progressing   Problem: Activity: Goal: Risk for activity intolerance will decrease Outcome: Progressing   Problem: Nutrition: Goal: Adequate nutrition will be maintained Outcome: Progressing   Problem: Elimination: Goal: Will not experience complications related to bowel motility Outcome: Progressing   Problem: Pain Managment: Goal: General experience of comfort will improve Outcome: Progressing   Problem: Safety: Goal: Ability to remain free from injury will improve Outcome: Progressing   Problem: Skin Integrity: Goal: Risk for impaired skin integrity will decrease Outcome: Progressing   Problem: Education: Goal: Ability to verbalize activity precautions or restrictions will improve Outcome: Progressing Goal: Knowledge of the prescribed therapeutic regimen will improve Outcome: Progressing Goal: Understanding of discharge needs will improve Outcome: Progressing   Problem: Activity: Goal: Ability to avoid complications of mobility impairment will improve Outcome: Progressing Goal: Ability to tolerate increased activity will improve Outcome: Progressing Goal: Will remain free from falls Outcome: Progressing   Problem: Bowel/Gastric: Goal: Gastrointestinal status for postoperative course will improve Outcome: Progressing   Problem: Clinical Measurements: Goal: Ability to maintain clinical measurements within normal limits will improve Outcome:  Progressing Goal: Postoperative complications will be avoided or minimized Outcome: Progressing Goal: Diagnostic test results will improve Outcome: Progressing   Problem: Pain Management: Goal: Pain level will decrease Outcome: Progressing   Problem: Health Behavior/Discharge Planning: Goal: Identification of resources available to assist in meeting health care needs will improve Outcome: Progressing

## 2019-11-05 NOTE — Progress Notes (Addendum)
PROGRESS NOTE  Melinda [name] JIR:[id number] DOB: 30-Apr-1951 DOA: 11/01/2019 PCP: Caesar Bookman, NP   LOS: 2 days   Brief narrative: As per HPI,  Melinda [name] is a 69 y.o. female with medical history significant for hypertension, CKD stage III, osteoarthritis, chronic back pain, and anxiety/depression who presents to the ED for evaluation of back pain after a fall at home.  History was limited from patient due to hypersomnolence after receiving medications in the ED and therefore majority of the history was obtained from EDP and chart review. Patient presented to the ED for evaluation of fatigue and falling at home over the last couple days.   ED Course:  Initial vitals showed BP 132/77, pulse 62, RR 12, SPO2 100% on room air.Labs notable for WBC 6.9, hemoglobin 11.6, platelets 311,000, sodium 141, potassium 3.1, bicarb 25, BUN 15, creatinine 1.43, serum glucose 93.  SARS-CoV-2 PCR is negative.  Influenza A/B PCR's are negative. Portable chest x-ray is negative for focal consolidation, edema, or effusion. CT head without contrast is negative for acute intracranial process. MRI lumbar spine without contrast shows changes suggestive of acute to subacute L1 compression fracture without other acute abnormality within the lumbar spine.  Mild to moderate multilevel facet hypertrophy without significant spinal stenosis is noted, most pronounced at L4-5. Bilateral Lower extremity Dopplers were obtained and negative for DVT bilaterally.The hospitalist service was consulted to admit for further evaluation/management.  Assessment/Plan:  Principal Problem:   Closed compression fracture of first lumbar vertebra (HCC) Active Problems:   HTN (hypertension)   Acute on chronic renal insufficiency   Anxiety and depression   Hypokalemia  Falls with fracture of L1 vertebrae Post fall with acute on chronic back pain secondary to acute/subacute compression fracture of the L1 vertebra.  Patient did have CT scan  followed by MRI of the back which showed marrow edema in vertebral compression fracture of L1.   Seen by interventional radiology and underwent kyphoplasty on 11/04/19. On TSLO brace as per neurosurgery. Patient will benefit from follow up with  Neurosurgery as  outpatient.   Acute kidney injury on CKD IIIb. Hold NSAIDs and Lasix for now. Creatinine today at 1.4.  Hypokalemia Replenished and improved.    Essential hypertension Continue Lopressor.  Hold Lasix for now.  Blood pressure is slightly low today.  Anxiety and depression Continue citalopram, Elavil.    Patient is on 800 mg twice daily of Neurontin at home.  Pharmacy recommends 600 mg twice daily.. Will closely monitor.  VTE Prophylaxis: Lovenox subcu  Code Status: Full code  Family Communication: None today. I spoke with the patient's daughter on the phone yesterday.  Status is: Inpatient  The patient will be inpatient because: status post kyphoplasty, ambulatory dysfunction  Dispo: The patient is from: Home              Anticipated d/c is to: Seen by physical therapy today recommended skilled nursing facility.  Spoke with the patient as well as patient's family.  At this time patient is leaning towards home with home health.               Anticipated d/c date is: Tomorrow               Patient currently is medically stable to d/c.  Consultants:  Interventional Radiology  Procedures:  L1 kyphoplasty on 11/04/19  Antibiotics:  . None  Anti-infectives (From admission, onward)   Start     Dose/Rate Route Frequency Ordered Stop  11/04/19 1147  ceFAZolin (ANCEF) 2-4 GM/100ML-% IVPB    Note to Pharmacy: Anthony Sar   : cabinet override      11/04/19 1147 11/04/19 1230     Subjective: Today, patient was seen and examined at bedside.  Seen sitting at the bedside.  Seen by physical therapy.  On TSLO brace.   Objective: Vitals:   11/04/19 2056 11/05/19 0634  BP: 109/72 (!) 96/52  Pulse: 66 76  Resp: 18 18   Temp: 98.6 F (37 C) 97.6 F (36.4 C)  SpO2: 100% 100%    Intake/Output Summary (Last 24 hours) at 11/05/2019 1316 Last data filed at 11/05/2019 1043 Gross per 24 hour  Intake 3060 ml  Output 2800 ml  Net 260 ml   Filed Weights   11/02/19 0219 11/04/19 1209  Weight: 88.5 kg 88.5 kg   Body mass index is 33.47 kg/m.   Physical Exam: GENERAL: Patient is alert awake and oriented. Not in obvious distress.  Obese HENT: No scleral pallor or icterus. Pupils equally reactive to light. Oral mucosa is moist NECK: is supple, no gross swelling noted. CHEST: Clear to auscultation. No crackles or wheezes.  Diminished breath sounds bilaterally.    TSLO brace in place CVS: S1 and S2 heard, no murmur. Regular rate and rhythm.  ABDOMEN: Soft, non-tender, bowel sounds are present. EXTREMITIES: No edema. CNS: Cranial nerves are intact.  Moving extremities. SKIN: warm and dry without rashes.  Data Review: I have personally reviewed the following laboratory data and studies,  CBC: Recent Labs  Lab 11/01/19 1352 11/02/19 0244 11/03/19 0235 11/04/19 0307  WBC 6.9 6.5 6.5 6.6  NEUTROABS 3.0  --   --   --   HGB 11.6* 11.0* 9.8* 10.0*  HCT 38.0 35.8* 33.1* 32.9*  MCV 90.7 90.9 92.2 92.7  PLT 311 292 300 305   Basic Metabolic Panel: Recent Labs  Lab 11/01/19 1352 11/02/19 0244 11/03/19 0235 11/04/19 0307  NA 141 143 139 135  K 3.1* 3.7 4.5 4.5  CL 104 104 105 103  CO2 25 30 26 24   GLUCOSE 93 102* 93 97  BUN 15 14 13 13   CREATININE 1.43* 1.25* 1.40* 1.42*  CALCIUM 9.0 9.1 8.6* 8.5*  MG  --   --  2.2 2.1  PHOS  --   --  3.6  --    Liver Function Tests: Recent Labs  Lab 11/03/19 0235 11/04/19 0307  AST 12* 12*  ALT 9 10  ALKPHOS 54 54  BILITOT 0.5 0.7  PROT 6.3* 6.1*  ALBUMIN 3.5 3.4*   No results for input(s): LIPASE, AMYLASE in the last 168 hours. No results for input(s): AMMONIA in the last 168 hours. Cardiac Enzymes: No results for input(s): CKTOTAL, CKMB, CKMBINDEX,  TROPONINI in the last 168 hours. BNP (last 3 results) Recent Labs    05/10/19 0411  BNP 312.3*    ProBNP (last 3 results) No results for input(s): PROBNP in the last 8760 hours.  CBG: No results for input(s): GLUCAP in the last 168 hours. Recent Results (from the past 240 hour(s))  Respiratory Panel by RT PCR (Flu A&B, Covid) - Nasopharyngeal Swab     Status: None   Collection Time: 11/01/19  1:52 PM   Specimen: Nasopharyngeal Swab  Result Value Ref Range Status   SARS Coronavirus 2 by RT PCR NEGATIVE NEGATIVE Final    Comment: (NOTE) SARS-CoV-2 target nucleic acids are NOT DETECTED. The SARS-CoV-2 RNA is generally detectable in upper respiratoy specimens  during the acute phase of infection. The lowest concentration of SARS-CoV-2 viral copies this assay can detect is 131 copies/mL. A negative result does not preclude SARS-Cov-2 infection and should not be used as the sole basis for treatment or other patient management decisions. A negative result may occur with  improper specimen collection/handling, submission of specimen other than nasopharyngeal swab, presence of viral mutation(s) within the areas targeted by this assay, and inadequate number of viral copies (<131 copies/mL). A negative result must be combined with clinical observations, patient history, and epidemiological information. The expected result is Negative. Fact Sheet for Patients:  PinkCheek.be Fact Sheet for Healthcare Providers:  GravelBags.it This test is not yet ap proved or cleared by the [place] FDA and  has been authorized for detection and/or diagnosis of SARS-CoV-2 by FDA under an Emergency Use Authorization (EUA). This EUA will remain  in effect (meaning this test can be used) for the duration of the COVID-19 declaration under Section 564(b)(1) of the Act, 21 U.S.C. section 360bbb-3(b)(1), unless the authorization is terminated  or revoked sooner.    Influenza A by PCR NEGATIVE NEGATIVE Final   Influenza B by PCR NEGATIVE NEGATIVE Final    Comment: (NOTE) The Xpert Xpress SARS-CoV-2/FLU/RSV assay is intended as an aid in  the diagnosis of influenza from Nasopharyngeal swab specimens and  should not be used as a sole basis for treatment. Nasal washings and  aspirates are unacceptable for Xpert Xpress SARS-CoV-2/FLU/RSV  testing. Fact Sheet for Patients: PinkCheek.be Fact Sheet for Healthcare Providers: GravelBags.it This test is not yet approved or cleared by the [place] FDA and  has been authorized for detection and/or diagnosis of SARS-CoV-2 by  FDA under an Emergency Use Authorization (EUA). This EUA will remain  in effect (meaning this test can be used) for the duration of the  Covid-19 declaration under Section 564(b)(1) of the Act, 21  U.S.C. section 360bbb-3(b)(1), unless the authorization is  terminated or revoked. Performed at [facility], [place] [address], [place], [place] [address]   Surgical pcr screen     Status: Abnormal   Collection Time: 11/04/19  5:51 AM   Specimen: Nasal Mucosa; Nasal Swab  Result Value Ref Range Status   MRSA, PCR POSITIVE (A) NEGATIVE Final    Comment: RESULT CALLED TO, READ BACK BY AND VERIFIED WITH: Thereasa Distance RN 1025 11/04/19 JM    Staphylococcus aureus POSITIVE (A) NEGATIVE Final    Comment: (NOTE) The Xpert SA Assay (FDA approved for NASAL specimens in patients 68 years of age and older), is one component of a comprehensive surveillance program. It is not intended to diagnose infection nor to guide or monitor treatment. Performed at [facility], [place] [address], [place], [place] [address]      Studies: IR KYPHO LUMBAR INC FX REDUCE BONE BX UNI/BIL CANNULATION INC/IMAGING  Result Date: 11/04/2019 CLINICAL DATA:  Symptomatic L1 compression fracture.  Please perform image guided kyphoplasty for symptomatic purposes. Please refer to formal consultation in the epic EMR dated 11/02/2019 for additional details. EXAM: FLUOROSCOPIC GUIDED KYPHOPLASTY OF THE L1 VERTEBRAL BODY COMPARISON:  Lumbar spine MRI-11/04/2019 MEDICATIONS: As antibiotic prophylaxis, Ancef 2 gm IV was ordered pre-procedure and administered intravenously within 1 hour of incision. ANESTHESIA/SEDATION: Moderate (conscious) sedation was employed during this procedure. A total of Versed 2.5 mg and Fentanyl 100 mcg was administered intravenously. Moderate Sedation Time: 25 minutes. The patient's level of consciousness and vital signs were monitored continuously by radiology nursing throughout  the procedure under my direct supervision. FLUOROSCOPY TIME:  7 minutes, 48 seconds (660 mGy) COMPLICATIONS: None immediate. TECHNIQUE: The procedure, risks (including but not limited to bleeding, infection, organ damage), benefits, and alternatives were explained to the patient. Questions regarding the procedure were encouraged and answered. The patient understands and consents to the procedure. The patient was placed prone on the fluoroscopic table. The skin overlying the upper thoracic region was then prepped and draped in the usual sterile fashion. Maximal barrier sterile technique was utilized including caps, mask, sterile gowns, sterile gloves, sterile drape, hand hygiene and skin antiseptic. Intravenous Fentanyl and Versed were administered as conscious sedation during continuous cardiorespiratory monitoring by the radiology RN. The left pedicle at L1 was then infiltrated with 1% lidocaine followed by the advancement of a Kyphon trocar needle through the left pedicle into the posterior one-third of the vertebral body. Subsequently, the osteo drill was advanced to the anterior third of the vertebral body. The osteo drill was retracted. Through the working cannula, a Kyphon inflatable bone tamp 15 x 2 was  advanced and positioned with the distal marker approximately 5 mm from the anterior aspect of the cortex. Appropriate positioning was confirmed on the AP projection. At this time, the balloon was expanded using contrast via a Kyphon inflation syringe device via micro tubing. In similar fashion, the right L1 pedicle was infiltrated with 1% lidocaine followed by the advancement of a second Kyphon trocar needle through the right pedicle into the posterior third of the vertebral body. Subsequently, the osteo drill was coaxially advanced to the anterior right third. The osteo drill was exchanged for a Kyphon inflatable bone tamp 15 x 2, advanced to the 5 mm of the anterior aspect of the cortex. The balloon was then expanded using contrast as above. Inflations were continued until there was near apposition with the superior end plate. At this time, methylmethacrylate mixture was reconstituted in the Kyphon bone mixing device system. This was then loaded into the delivery mechanism, attached to Kyphon bone fillers. The balloons were deflated and removed followed by the instillation of methylmethacrylate mixture with excellent filling in the AP and lateral projections. No extravasation was noted in the disk spaces, however a small amount cement is seen extending posteriorly to the level of the spinal. No epidural venous contamination was seen. The working cannulae and the bone filler were then retrieved and removed. Hemostasis was achieved with manual compression. The patient tolerated the procedure well without immediate postprocedural complication. IMPRESSION: 1. Technically successful L1 vertebral body augmentation using balloon kyphoplasty. 2. Per CMS PQRS reporting requirements (PQRS Measure 24): Given the patient's age of greater than 50 and the fracture site (hip, distal radius, or spine), the patient should be tested for osteoporosis using DXA, and the appropriate treatment considered based on the DXA results.  Electronically Signed   By: Simonne Come M.D.   On: 11/04/2019 16:29     Joycelyn Das, MD  [facility] 11/05/2019

## 2019-11-05 NOTE — Progress Notes (Addendum)
Physical Therapy Treatment Patient Details Name: Melinda [name] MRN: [id number] DOB: April 04, 1951 Today's Date: 11/05/2019    History of Present Illness 69 y.o. female with medical history significant for hypertension, CKD stage III, osteoarthritis, chronic back pain, and anxiety/depression who presents to the ED for evaluation of back pain after a fall at home. Dx of L1 fx, s/p KP 11/04/19.    PT Comments    Pt admitted with above diagnosis. Min A for bed mobility, +2 mod assist to stand, +2 min assist to pivot to recliner. Pt reports frequent falls at home, she has significant LLE weakness. 24* assist recommended and hands on assist recommended for mobility.  Pt currently with functional limitations due to the deficits listed below (see PT Problem List). Pt will benefit from skilled PT to increase their independence and safety with mobility to allow discharge to the venue listed below.     Follow Up Recommendations  SNF;Supervision/Assistance - 24 hour(assistance for mobility; pt is considering DC to her daughter's home which would require PTAR to assist her up 15 steps to enter home)     Equipment Recommendations  Wheelchair (measurements PT);Wheelchair cushion (measurements PT)(pt requesting motorized scooter, informed her an order would need to come from primary physician)    Recommendations for Other Services       Precautions / Restrictions Precautions Precautions: Back Precaution Booklet Issued: Yes (comment) Required Braces or Orthoses: Spinal Brace Spinal Brace: Thoracolumbosacral orthotic Restrictions Weight Bearing Restrictions: No    Mobility  Bed Mobility Overal bed mobility: Needs Assistance Bed Mobility: Rolling;Sidelying to Sit Rolling: Supervision Sidelying to sit: Min assist       General bed mobility comments: VCs for log roll technique  Transfers Overall transfer level: Needs assistance Equipment used: Rolling walker (2 wheeled) Transfers: Sit to/from  [employer] Sit to Stand: +2 physical assistance;+2 safety/equipment;From elevated surface;Mod assist Stand pivot transfers: Min assist;Min guard;+2 safety/equipment;+2 physical assistance       General transfer comment: assist to rise, +2 safety due to frequent falls.  Ambulation/Gait             General Gait Details: did not attempt, pt felt he BLEs were weak with SPT   Stairs             Wheelchair Mobility    Modified Rankin (Stroke Patients Only)       Balance Overall balance assessment: Needs assistance;History of Falls Sitting-balance support: Feet supported;No upper extremity supported Sitting balance-Leahy Scale: Good     Standing balance support: Bilateral upper extremity supported Standing balance-Leahy Scale: Poor Standing balance comment: reliant upon BUE support                            Cognition Arousal/Alertness: Awake/alert Behavior During Therapy: WFL for tasks assessed/performed Overall Cognitive Status: Within Functional Limits for tasks assessed                                        Exercises      General Comments        Pertinent Vitals/Pain Pain Assessment: 0-10 Pain Score: 6  Pain Location: back Pain Descriptors / Indicators: Aching Pain Intervention(s): Limited activity within patient's tolerance;Monitored during session;Repositioned    Home Living Family/patient expects to be discharged to:: Private residence Living Arrangements: Alone Available Help at Discharge: Available PRN/intermittently Type of Home: Apartment  Home Access: Elevator   Home Layout: One level Home Equipment: [place] - 4 wheels;Bedside commode      Prior Function Level of Independence: Independent with assistive device(s)      Comments: Uses rollator for short distances. Reports multiple falls. Pt would like to DC to her daughter's home which has 15 steps to enter with B rails, then no stairs inside.    PT Goals (current goals can now be found in the care plan section) Acute Rehab PT Goals Patient Stated Goal: to get stronger, walk PT Goal Formulation: With patient Time For Goal Achievement: 11/19/19 Potential to Achieve Goals: Good    Frequency    Min 3X/week      PT Plan      Co-evaluation              AM-PAC PT "6 Clicks" Mobility   Outcome Measure  Help needed turning from your back to your side while in a flat bed without using bedrails?: A Little Help needed moving from lying on your back to sitting on the side of a flat bed without using bedrails?: A Little Help needed moving to and from a bed to a chair (including a wheelchair)?: A Little Help needed standing up from a chair using your arms (e.g., wheelchair or bedside chair)?: A Lot Help needed to walk in hospital room?: A Lot Help needed climbing 3-5 steps with a railing? : Total 6 Click Score: 14    End of Session Equipment Utilized During Treatment: Gait belt;Back brace Activity Tolerance: Patient limited by fatigue Patient left: in chair;with call bell/phone within reach;with chair alarm set Nurse Communication: Mobility status PT Visit Diagnosis: Difficulty in walking, not elsewhere classified (R26.2);Pain;Muscle weakness (generalized) (M62.81)     Time: 6283-1517 PT Time Calculation (min) (ACUTE ONLY): 25 min  Charges:  $Therapeutic Activity: 8-22 mins            Evaluation mod         Blondell Reveal Kistler PT 11/05/2019  Acute Rehabilitation Services Pager [phone] Office [phone]

## 2019-11-07 ENCOUNTER — Other Ambulatory Visit: Payer: Self-pay | Admitting: Family

## 2019-11-07 ENCOUNTER — Telehealth: Payer: Self-pay | Admitting: *Deleted

## 2019-11-07 DIAGNOSIS — J302 Other seasonal allergic rhinitis: Secondary | ICD-10-CM

## 2019-11-07 MED ORDER — OXYCODONE HCL 15 MG PO TABS: 15.0000 mg | ORAL_TABLET | Freq: Three times a day (TID) | ORAL | 0 refills | Status: DC | PRN

## 2019-11-07 NOTE — Telephone Encounter (Signed)
Patient requested refilled. Discharged from [facility] 11/05/19. TOC appointment scheduled with [name] on 11/11/2019.

## 2019-11-07 NOTE — Telephone Encounter (Signed)
Transition Care Management Follow-up Telephone Call  Date of discharge and from where: 11/05/2019 [place]  How have you been since you were released from the hospital? Better, in Pain  Any questions or concerns? Yes  Wants her pain medication Refilled (Pended for [name] Approval)  Items Reviewed:  Did the pt receive and understand the discharge instructions provided? Yes   Medications obtained and verified? Yes   Any new allergies since your discharge? No   Dietary orders reviewed? Yes  Do you have support at home? Yes   Other (ie: DME, Home Health, etc) Home Health  Functional Questionnaire: (I = Independent and D = Dependent) ADL's: I with assistance -rollator/walker  Bathing/Dressing- I with assistance   Meal Prep- D  Eating- I  Maintaining continence- I  Transferring/Ambulation- I with assistance  Managing Meds- I   Follow up appointments reviewed:    PCP Hospital f/u appt confirmed? Yes  Scheduled to see [name] on 11/11/19 .  Specialist Hospital f/u appt confirmed? No    Are transportation arrangements needed? No  [employer]  If their condition worsens, is the pt aware to call  their PCP or go to the ED? Yes  Was the patient provided with contact information for the PCP's office or ED? Yes  Was the pt encouraged to call back with questions or concerns? Yes

## 2019-11-11 ENCOUNTER — Encounter: Payer: Medicare HMO | Admitting: Family

## 2019-11-14 ENCOUNTER — Other Ambulatory Visit: Payer: Self-pay | Admitting: Interventional Radiology

## 2019-11-14 DIAGNOSIS — S32010A Wedge compression fracture of first lumbar vertebra, initial encounter for closed fracture: Secondary | ICD-10-CM

## 2019-11-16 ENCOUNTER — Telehealth: Payer: Self-pay | Admitting: *Deleted

## 2019-11-16 NOTE — Telephone Encounter (Signed)
Patient called and stated that she missed her last appointment because she was in so much pain.  Stated that she has misplaced her pain medication and does not know where it is. Wanting [name] to call in her another Rx to a different pharmacy- [facility] [address] [phone].  Stated that she is in so much pain and needs this medication. Please Advise.   [place]CCSRS Database Verified LR: 11/07/2019

## 2019-11-16 NOTE — Telephone Encounter (Signed)
Patient requested TeleVisit with [name] to discuss medications.  Appointment scheduled for 5/20.

## 2019-11-16 NOTE — Telephone Encounter (Signed)
Will have to wait until the due date to refill medication.

## 2019-11-17 ENCOUNTER — Telehealth (INDEPENDENT_AMBULATORY_CARE_PROVIDER_SITE_OTHER): Payer: Medicare HMO | Admitting: Family

## 2019-11-17 ENCOUNTER — Other Ambulatory Visit: Payer: Self-pay

## 2019-11-17 ENCOUNTER — Encounter: Payer: Self-pay | Admitting: Family

## 2019-11-17 DIAGNOSIS — G8929 Other chronic pain: Secondary | ICD-10-CM | POA: Diagnosis not present

## 2019-11-17 DIAGNOSIS — M5441 Lumbago with sciatica, right side: Secondary | ICD-10-CM | POA: Diagnosis not present

## 2019-11-17 DIAGNOSIS — M5442 Lumbago with sciatica, left side: Secondary | ICD-10-CM

## 2019-11-17 DIAGNOSIS — M549 Dorsalgia, unspecified: Secondary | ICD-10-CM | POA: Insufficient documentation

## 2019-11-17 DIAGNOSIS — G894 Chronic pain syndrome: Secondary | ICD-10-CM

## 2019-11-17 MED ORDER — LIDOCAINE 5 % EX PTCH: 1.0000 | MEDICATED_PATCH | Freq: Two times a day (BID) | CUTANEOUS | 1 refills | Status: DC

## 2019-11-17 NOTE — Patient Instructions (Addendum)
-    Please call Physical Therapy as ordered from your recent hospital admission. - Continue to  follow up with Neurosurgery as directed. - lidocaine (LIDODERM) 5 %; Place 1 patch onto the skin every 12 (twelve) hours. Remove & Discard patch within 12 hours or as directed by MD. To area of greatest pain in the back  Dispense: 30 patch; Refill: 1 - Referral to Pain Clinic order placed today.Specilist office will call you to schedule appointment.

## 2019-11-17 NOTE — Progress Notes (Signed)
Provider: Richarda Blade FNP-C  Teshaun Olarte, [name], NP  Patient Care Team: Sharnise Blough, [name], NP as PCP - General (Family Medicine)  Extended Emergency Contact Information Primary Emergency Contact: Jerrye Beavers [place] Mobile Phone: [phone] Relation: Daughter Secondary Emergency Contact: Dutch,Lashawn Mobile Phone: [phone] Relation: Daughter  Code Status: Full Code  Goals of care: Advanced Directive information Advanced Directives 11/17/2019  Does Patient Have a Medical Advance Directive? No  Type of Advance Directive -  Does patient want to make changes to medical advance directive? No - Patient declined  Copy of Healthcare Power of Attorney in Chart? -  Would patient like information on creating a medical advance directive? -     Chief Complaint  Patient presents with  . Medical Management of Chronic Issues    Medication Problems.    HPI:  Pt is a 69 y.o. female seen today for an acute visit to discuss refill for pain medication.she states missed placed her oxycodone 15 mg IR tablet every 8 Hrs as needed.Pain medication script was filled on 11/07/2019.states unable to find it in the house.states hurting on her lower back.she has called in the past that she misplaced her medication.States Physical Therapy was ordered post her recent hospital admission 11/01/2019 - 11/05/2019  due to fall but does not want anyone telling what to do." I don't want to talk to them"She states sits on her walker to get around in the house.States would like to go up to [place] to stay with family for several months wants to know whether provider can approve her travel.MRI indicated subacute L1 compression fracture without other acute abnormality within the lumbar spine.she was seen by Intervention radiology and underwent Kyphoplasty on 11/04/2019.TSLO brace was ordered by Neurosurgery and follow up outpatient with Neurosurgery. She was no show for her transitional of care appointment  with PCP on 11/11/2019. I've discussed with patient search for her pain medication bottle since she leaves by herself in the house.Recommended sending script for her Gabapentin but states has a lot in the house.will send script for Lidoderm patch.Also recommended referral to pain management clinic.patient got irritable with provider states should fill 120 tablets per month. I've discussed with her increase fall risk. Patient hanged up on provider.    Past Medical History:  Diagnosis Date  . Acute kidney injury (HCC)   . Allergy   . Anxiety   . Arthritis    "99% of my body" (11/11/2016)  . Asthma   . Bursitis of both hips   . Cataract   . Cervical scoliosis   . Chronic back pain    "all over my back" (11/11/2016)  . Depressive disorder, not elsewhere classified   . Enthesopathy of hip region   . Environmental allergies    "I take Claritin qd; 365 days/year" (11/11/2016)  . Essential hypertension, benign   . Fibromyalgia   . Frequent falls   . GERD (gastroesophageal reflux disease)   . Headache    "at least 1/wk; may last for 2 days or so" (11/11/2016)  . History of blood transfusion 1985   w/hysterectomy  . Lumbar stenosis   . Migraine    "a few/month" (11/11/2016)  . Muscle weakness (generalized)   . Myalgia and myositis, unspecified   . Osteoporosis   . Other abnormal blood chemistry   . Other malaise and fatigue   . Raynaud's syndrome   . Sciatic nerve pain, left   . Sciatica   . Unspecified vitamin D deficiency  Past Surgical History:  Procedure Laterality Date  . ABDOMINAL HYSTERECTOMY  1985  . CESAREAN SECTION  1976; 1979  . IR KYPHO LUMBAR INC FX REDUCE BONE BX UNI/BIL CANNULATION INC/IMAGING  11/04/2019  . SHOULDER OPEN ROTATOR CUFF REPAIR Left     No Known Allergies  Outpatient Encounter Medications as of 11/17/2019  Medication Sig  . acetaminophen (TYLENOL) 500 MG tablet Take 1,000 mg by mouth every 8 (eight) hours. For Pain  . amitriptyline (ELAVIL) 150 MG  tablet Take 1 tablet (150 mg total) by mouth at bedtime.  Marland Kitchen ascorbic acid (VITAMIN C) 500 MG tablet Take 1 tablet (500 mg total) by mouth 2 (two) times daily.  Marland Kitchen aspirin EC 81 MG tablet Take 81 mg by mouth daily.  . carboxymethylcellulose (REFRESH PLUS) 0.5 % SOLN Place 2 drops into both eyes 2 (two) times daily as needed (dry eyes).   . citalopram (CELEXA) 20 MG tablet Take 1 tablet (20 mg total) by mouth daily.  . [employer] D3 50 MCG (2000 UT) CAPS TAKE 1 CAPSULE BY MOUTH EVERY DAY  . fluticasone (FLONASE) 50 MCG/ACT nasal spray PLACE 2 SPRAYS INTO BOTH NOSTRILS DAILY AS NEEDED FOR ALLERGIES OR RHINITIS.  . furosemide (LASIX) 40 MG tablet Take 1 tablet (40 mg total) by mouth daily.  Marland Kitchen lidocaine (LIDODERM) 5 % Place 1 patch onto the skin every 12 (twelve) hours. Remove & Discard patch within 12 hours or as directed by MD. To area of greatest pain in the back  . loratadine (CLARITIN) 10 MG tablet TAKE 1 TABLET BY MOUTH EVERY DAY  . metoprolol tartrate (LOPRESSOR) 50 MG tablet TAKE ONE TABLET BY MOUTH TWICE DAILY FOR BLOOD PRESSURE  . oxyCODONE (ROXICODONE) 15 MG immediate release tablet Take 1 tablet (15 mg total) by mouth every 8 (eight) hours as needed for pain.  Marland Kitchen senna-docusate (SENOKOT-S) 8.6-50 MG tablet Take 1 tablet by mouth at bedtime as needed for mild constipation.  . SUMAtriptan (IMITREX) 25 MG tablet TAKE 1 TAB EVERY 2 HRS AS NEEDED FOR MIGRAINE OR HEADACHE. MAY REPEAT IN 2 HRS IF PERSISTS/RECURS.  . vitamin B-12 (CYANOCOBALAMIN) 500 MCG tablet Take 1 tablet (500 mcg total) by mouth daily.  . [DISCONTINUED] gabapentin (NEURONTIN) 300 MG capsule Take 2 capsules (600 mg total) by mouth 2 (two) times daily. POLYNEUROPATHY   No facility-administered encounter medications on file as of 11/17/2019.    Review of Systems  Constitutional: Negative for appetite change, chills, fatigue and fever.  Respiratory: Negative for cough, chest tightness, shortness of breath and wheezing.   Cardiovascular:  Negative for chest pain, palpitations and leg swelling.  Gastrointestinal: Negative for abdominal distention, abdominal pain, constipation, diarrhea, nausea and vomiting.  Musculoskeletal: Positive for arthralgias, back pain and gait problem. Negative for myalgias.       Misplaced pain medication in the house per HPI request refill   Skin: Negative for color change, pallor and rash.  Neurological: Negative for dizziness, speech difficulty, light-headedness and headaches.       Chronic neuropathy.   Psychiatric/Behavioral: Negative for agitation and sleep disturbance. The patient is not nervous/anxious.     Immunization History  Administered Date(s) Administered  . Fluad Quad(high Dose 65+) 03/29/2019  . Influenza,inj,Quad PF,6+ Mos 05/23/2015, 05/28/2016, 03/24/2018  . Influenza-Unspecified 07/07/2012, 02/28/2014  . PFIZER SARS-COV-2 Vaccination 10/03/2019  . Pneumococcal Conjugate-13 07/14/2014  . Pneumococcal Polysaccharide-23 09/11/2017, 11/03/2019  . Tdap 01/26/2016   Pertinent  Health Maintenance Due  Topic Date Due  . COLONOSCOPY  Never  done  . MAMMOGRAM  09/04/2019  . INFLUENZA VACCINE  01/29/2020  . DEXA SCAN  Completed  . PNA vac Low Risk Adult  Completed   Fall Risk  11/17/2019 10/05/2019 07/20/2019 07/04/2019 03/29/2019  Falls in the past year? 1 1 1 1 1   Number falls in past yr: 1 1 1 1 1   Comment - - - - -  Injury with Fall? 1 1 1 1  0  Comment - - - - -  Risk Factor Category  - - - - -  Risk for fall due to : - - - - -  Follow up - - - - -   There were no vitals filed for this visit. There is no height or weight on file to calculate BMI. Physical Exam Unable to complete on Telephone visit.  Labs reviewed: Recent Labs    11/02/19 0244 11/03/19 0235 11/04/19 0307  NA 143 139 135  K 3.7 4.5 4.5  CL 104 105 103  CO2 30 26 24   GLUCOSE 102* 93 97  BUN 14 13 13   CREATININE 1.25* 1.40* 1.42*  CALCIUM 9.1 8.6* 8.5*  MG  --  2.2 2.1  PHOS  --  3.6  --    Recent  Labs    09/12/19 1240 09/12/19 1240 10/05/19 0942 11/03/19 0235 11/04/19 0307  AST 17   < > 15 12* 12*  ALT 16   < > 19 9 10   ALKPHOS 71  --   --  54 54  BILITOT 0.6   < > 0.7 0.5 0.7  PROT 7.6   < > 7.0 6.3* 6.1*  ALBUMIN 4.2  --   --  3.5 3.4*   < > = values in this interval not displayed.   Recent Labs    09/12/19 1240 09/22/19 0000 10/05/19 0942 10/05/19 0942 11/01/19 1352 11/01/19 1352 11/02/19 0244 11/03/19 0235 11/04/19 0307  WBC   < > 5.7 4.9   < > 6.9   < > 6.5 6.5 6.6  NEUTROABS  --  2 1,950  --  3.0  --   --   --   --   HGB   < > 11.0* 12.1   < > 11.6*   < > 11.0* 9.8* 10.0*  HCT   < > 34* 37.0   < > 38.0   < > 35.8* 33.1* 32.9*  MCV   < >  --  85.1   < > 90.7   < > 90.9 92.2 92.7  PLT   < > 315 386   < > 311   < > 292 300 305   < > = values in this interval not displayed.   Lab Results  Component Value Date   TSH 1.58 10/05/2019   Lab Results  Component Value Date   HGBA1C 5.3 03/29/2019   Lab Results  Component Value Date   CHOL 194 10/05/2019   HDL 46 (L) 10/05/2019   LDLCALC 127 (H) 10/05/2019   TRIG 107 10/05/2019   CHOLHDL 4.2 10/05/2019    Significant Diagnostic Results in last 30 days:  CT Head Wo Contrast  Result Date: 11/01/2019 CLINICAL DATA:  Acute pain due to trauma. Fatigue and frequent falls. EXAM: CT HEAD WITHOUT CONTRAST TECHNIQUE: Contiguous axial images were obtained from the base of the skull through the vertex without intravenous contrast. COMPARISON:  CT head dated 10/10/2017. FINDINGS: Brain: No evidence of acute infarction, hemorrhage, hydrocephalus, extra-axial collection or mass lesion/mass effect. Vascular:  No hyperdense vessel or unexpected calcification. Skull: Normal. Negative for fracture or focal lesion. Sinuses/Orbits: No acute finding. Other: None. IMPRESSION: No acute intracranial process. Electronically Signed   By: Katherine Mantle M.D.   On: 11/01/2019 15:09   MR LUMBAR SPINE WO CONTRAST  Result Date:  11/01/2019 CLINICAL DATA:  Initial evaluation for low back pain, recent falls. EXAM: MRI LUMBAR SPINE WITHOUT CONTRAST TECHNIQUE: Multiplanar, multisequence MR imaging of the lumbar spine was performed. No intravenous contrast was administered. COMPARISON:  Prior MRI from 11/21/2017. FINDINGS: Segmentation: Standard. Lowest well-formed disc space labeled the L5-S1 level. Alignment: Physiologic with preservation of the normal lumbar lordosis. No listhesis. Vertebrae: Subtle linear T1 hypointensity the with mild marrow edema seen extending through the central and right aspect of the superior endplate of T1, suspicious for an acute to subacute compression fracture (series 5, image 9). Superimposed endplate Schmorl's node deformity noted. Relatively mild height loss of up to approximately 30% without bony retropulsion. This is benign/mechanical in appearance. Vertebral body height otherwise maintained without evidence for acute or chronic fracture. Bone marrow signal intensity within normal limits. No discrete or worrisome osseous lesions. No other abnormal marrow edema. Conus medullaris and cauda equina: Conus extends to the T12-L1 level. Conus and cauda equina appear normal. Paraspinal and other soft tissues: Paraspinous soft tissues within normal limits. Simple exophytic cyst seen extending from the lower pole the left kidney. Visualized visceral structures otherwise unremarkable. Disc levels: T12-L1: Disc desiccation without significant disc bulge. No canal or foraminal stenosis. L1-2: Negative interspace. Mild bilateral facet hypertrophy, greater on the right. No stenosis. L2-3: Disc desiccation without disc bulge. Mild bilateral facet hypertrophy. No significant canal or foraminal stenosis. L3-4: Disc desiccation without significant disc bulge. Mild to moderate facet hypertrophy. No significant canal or foraminal stenosis. L4-5: Negative interspace. Moderate facet and ligament flavum hypertrophy. Mild spinal  stenosis, relatively stable. Foramina remain patent. L5-S1: Negative interspace. Mild to moderate facet hypertrophy. No significant canal or lateral recess stenosis. Foramina remain patent. IMPRESSION: 1. Interval height loss with mild marrow edema involving the superior endplate of L1, suspicious for an acute to subacute compression fracture. Height loss is mild measuring no more than 30% without bony retropulsion. 2. No other acute abnormality within the lumbar spine. 3. Mild-to-moderate multilevel facet hypertrophy without significant spinal stenosis, most pronounced at L4-5. Electronically Signed   By: Rise Mu M.D.   On: 11/01/2019 23:52   DG Chest Port 1 View  Result Date: 11/01/2019 CLINICAL DATA:  Fatigue, falls EXAM: PORTABLE CHEST 1 VIEW COMPARISON:  09/20/2019 FINDINGS: The heart size and mediastinal contours are stable. Persistent elevation of the left hemidiaphragm. Chronic linear scarring or atelectasis in the left mid lung. No new focal airspace consolidation. No sizable pleural fluid collection. No pneumothorax. The visualized skeletal structures are unremarkable. IMPRESSION: No active disease. Electronically Signed   By: Duanne Guess D.O.   On: 11/01/2019 14:31   IR KYPHO LUMBAR INC FX REDUCE BONE BX UNI/BIL CANNULATION INC/IMAGING  Result Date: 11/04/2019 CLINICAL DATA:  Symptomatic L1 compression fracture. Please perform image guided kyphoplasty for symptomatic purposes. Please refer to formal consultation in the epic EMR dated 11/02/2019 for additional details. EXAM: FLUOROSCOPIC GUIDED KYPHOPLASTY OF THE L1 VERTEBRAL BODY COMPARISON:  Lumbar spine MRI-11/04/2019 MEDICATIONS: As antibiotic prophylaxis, Ancef 2 gm IV was ordered pre-procedure and administered intravenously within 1 hour of incision. ANESTHESIA/SEDATION: Moderate (conscious) sedation was employed during this procedure. A total of Versed 2.5 mg and Fentanyl 100 mcg was administered intravenously.  Moderate  Sedation Time: 25 minutes. The patient's level of consciousness and vital signs were monitored continuously by radiology nursing throughout the procedure under my direct supervision. FLUOROSCOPY TIME:  7 minutes, 48 seconds (660 mGy) COMPLICATIONS: None immediate. TECHNIQUE: The procedure, risks (including but not limited to bleeding, infection, organ damage), benefits, and alternatives were explained to the patient. Questions regarding the procedure were encouraged and answered. The patient understands and consents to the procedure. The patient was placed prone on the fluoroscopic table. The skin overlying the upper thoracic region was then prepped and draped in the usual sterile fashion. Maximal barrier sterile technique was utilized including caps, mask, sterile gowns, sterile gloves, sterile drape, hand hygiene and skin antiseptic. Intravenous Fentanyl and Versed were administered as conscious sedation during continuous cardiorespiratory monitoring by the radiology RN. The left pedicle at L1 was then infiltrated with 1% lidocaine followed by the advancement of a Kyphon trocar needle through the left pedicle into the posterior one-third of the vertebral body. Subsequently, the osteo drill was advanced to the anterior third of the vertebral body. The osteo drill was retracted. Through the working cannula, a Kyphon inflatable bone tamp 15 x 2 was advanced and positioned with the distal marker approximately 5 mm from the anterior aspect of the cortex. Appropriate positioning was confirmed on the AP projection. At this time, the balloon was expanded using contrast via a Kyphon inflation syringe device via micro tubing. In similar fashion, the right L1 pedicle was infiltrated with 1% lidocaine followed by the advancement of a second Kyphon trocar needle through the right pedicle into the posterior third of the vertebral body. Subsequently, the osteo drill was coaxially advanced to the anterior right third. The osteo  drill was exchanged for a Kyphon inflatable bone tamp 15 x 2, advanced to the 5 mm of the anterior aspect of the cortex. The balloon was then expanded using contrast as above. Inflations were continued until there was near apposition with the superior end plate. At this time, methylmethacrylate mixture was reconstituted in the Kyphon bone mixing device system. This was then loaded into the delivery mechanism, attached to Kyphon bone fillers. The balloons were deflated and removed followed by the instillation of methylmethacrylate mixture with excellent filling in the AP and lateral projections. No extravasation was noted in the disk spaces, however a small amount cement is seen extending posteriorly to the level of the spinal. No epidural venous contamination was seen. The working cannulae and the bone filler were then retrieved and removed. Hemostasis was achieved with manual compression. The patient tolerated the procedure well without immediate postprocedural complication. IMPRESSION: 1. Technically successful L1 vertebral body augmentation using balloon kyphoplasty. 2. Per CMS PQRS reporting requirements (PQRS Measure 24): Given the patient's age of greater than 50 and the fracture site (hip, distal radius, or spine), the patient should be tested for osteoporosis using DXA, and the appropriate treatment considered based on the DXA results. Electronically Signed   By: Simonne Come M.D.   On: 11/04/2019 16:29   VAS [place] LOWER EXTREMITY VENOUS (DVT) ([facility] and [facility] 7a-7p)  Result Date: 11/01/2019  Lower Venous DVTStudy Indications: Swelling.  Risk Factors: None identified. Limitations: Body habitus and poor ultrasound/tissue interface. Comparison Study: No prior studies. Performing Technologist: Chanda Busing RVT  Examination Guidelines: A complete evaluation includes B-mode imaging, spectral Doppler, color Doppler, and power Doppler as needed of all accessible portions of each vessel. Bilateral testing is considered  an integral part of a complete examination. Limited examinations  for reoccurring indications may be performed as noted. The reflux portion of the exam is performed with the patient in reverse Trendelenburg.  +---------+---------------+---------+-----------+----------+--------------+ RIGHT    CompressibilityPhasicitySpontaneityPropertiesThrombus Aging +---------+---------------+---------+-----------+----------+--------------+ CFV      Full           Yes      Yes                                 +---------+---------------+---------+-----------+----------+--------------+ SFJ      Full                                                        +---------+---------------+---------+-----------+----------+--------------+ FV Prox  Full                                                        +---------+---------------+---------+-----------+----------+--------------+ FV Mid   Full                                                        +---------+---------------+---------+-----------+----------+--------------+ FV DistalFull                                                        +---------+---------------+---------+-----------+----------+--------------+ PFV      Full                                                        +---------+---------------+---------+-----------+----------+--------------+ POP      Full           Yes      Yes                                 +---------+---------------+---------+-----------+----------+--------------+ PTV      Full                                                        +---------+---------------+---------+-----------+----------+--------------+ PERO     Full                                                        +---------+---------------+---------+-----------+----------+--------------+   +---------+---------------+---------+-----------+----------+--------------+ LEFT      CompressibilityPhasicitySpontaneityPropertiesThrombus Aging +---------+---------------+---------+-----------+----------+--------------+ CFV      Full  Yes      Yes                                 +---------+---------------+---------+-----------+----------+--------------+ SFJ      Full                                                        +---------+---------------+---------+-----------+----------+--------------+ FV Prox  Full                                                        +---------+---------------+---------+-----------+----------+--------------+ FV Mid   Full                                                        +---------+---------------+---------+-----------+----------+--------------+ FV DistalFull                                                        +---------+---------------+---------+-----------+----------+--------------+ PFV      Full                                                        +---------+---------------+---------+-----------+----------+--------------+ POP      Full           Yes      Yes                                 +---------+---------------+---------+-----------+----------+--------------+ PTV      Full                                                        +---------+---------------+---------+-----------+----------+--------------+ PERO     Full                                                        +---------+---------------+---------+-----------+----------+--------------+     Summary: RIGHT: - There is no evidence of deep vein thrombosis in the lower extremity. However, portions of this examination were limited- see technologist comments above.  - No cystic structure found in the popliteal fossa.  LEFT: - There is no evidence of deep vein thrombosis in the lower extremity. However, portions of this examination were limited- see technologist comments above.  - No cystic structure found in  the popliteal  fossa.  *See table(s) above for measurements and observations. Electronically signed by Deitra Mayo MD on 11/01/2019 at 4:30:17 PM.    Final     Assessment/Plan 1. Chronic pain syndrome On oxycodone 15 mg IR tablet every 8 Hrs as needed though reports has misplaced medication bottle in her house.Advised to search for her medication.Patient has called in the past with similar complains of misplacing her pain medication.Recommended refilling her Gabapentin and Lidoderm patch.Patient got irritable and hanged up on provider but later called clinical intake staff and left a voice mail apologizing on how she talked to provider and agrees with plan to send Lidoderm patch and referral to pain management clinic.  - lidocaine (LIDODERM) 5 %; Place 1 patch onto the skin every 12 (twelve) hours. Remove & Discard patch within 12 hours or as directed by MD. To area of greatest pain in the back  Dispense: 30 patch; Refill: 1 - Ambulatory referral to Pain Clinic  2. Chronic bilateral low back pain with bilateral sciatica Status post Kyphoplasty on 11/04/2019.TSLO brace was ordered by Neurosurgery. - Advised to call Physical Therapy as ordered post hospital admission. - continue to  follow up with Neurosurgery. - lidocaine (LIDODERM) 5 %; Place 1 patch onto the skin every 12 (twelve) hours. Remove & Discard patch within 12 hours or as directed by MD. To area of greatest pain in the back  Dispense: 30 patch; Refill: 1 - Ambulatory referral to Pain Clinic   Family/ staff Communication: Reviewed plan of care with patient verbalized understanding.   Labs/tests ordered: None   Next Appointment: As needed if symptoms worsen or fail to improve.   Spent 12 minutes of non-face to face with patient   I connected with  Rebecka Oelkers on 11/17/19 by a Telephone  enabled telemedicine application and verified that I am speaking with the correct person using two identifiers.   I discussed the limitations of evaluation  and management by telemedicine. The patient expressed understanding and agreed to proceed.  Sandrea Hughs, NP

## 2019-11-23 ENCOUNTER — Other Ambulatory Visit: Payer: Self-pay | Admitting: *Deleted

## 2019-11-23 DIAGNOSIS — G43009 Migraine without aura, not intractable, without status migrainosus: Secondary | ICD-10-CM

## 2019-11-23 MED ORDER — SUMATRIPTAN SUCCINATE 25 MG PO TABS: ORAL_TABLET | ORAL | 0 refills | Status: DC

## 2019-11-23 NOTE — Telephone Encounter (Signed)
Patient called requesting a refill on her migraine medication Sumatriptan. Pended and sent to [facility] for approval.   Also patient stated that she wanted to apologize to you for how she acted last week.

## 2019-11-24 ENCOUNTER — Telehealth: Payer: Self-pay

## 2019-11-24 NOTE — Telephone Encounter (Signed)
Patient PA Approved for Lidocaine Patches  Approvedon May 26 PA Case: [id number], Status: Approved, Coverage Starts on: 11/23/2019 12:00:00 AM, Coverage Ends on: 06/29/2020 12:00:00 AM. Questions? Contact [phone].

## 2019-11-25 ENCOUNTER — Other Ambulatory Visit: Payer: Self-pay | Admitting: Family

## 2019-11-29 NOTE — Telephone Encounter (Signed)
Message routed to original sender.  

## 2019-11-29 NOTE — Telephone Encounter (Signed)
Refill requested for Famotidine 20 mg tablet medication not on current list.please verify medication with patient.

## 2019-12-06 ENCOUNTER — Ambulatory Visit (INDEPENDENT_AMBULATORY_CARE_PROVIDER_SITE_OTHER): Payer: Medicare HMO | Admitting: Family

## 2019-12-06 ENCOUNTER — Encounter: Payer: Self-pay | Admitting: Family

## 2019-12-06 ENCOUNTER — Other Ambulatory Visit: Payer: Self-pay

## 2019-12-06 ENCOUNTER — Telehealth: Payer: Self-pay

## 2019-12-06 DIAGNOSIS — M5441 Lumbago with sciatica, right side: Secondary | ICD-10-CM

## 2019-12-06 DIAGNOSIS — F332 Major depressive disorder, recurrent severe without psychotic features: Secondary | ICD-10-CM

## 2019-12-06 DIAGNOSIS — G8929 Other chronic pain: Secondary | ICD-10-CM | POA: Diagnosis not present

## 2019-12-06 DIAGNOSIS — R2681 Unsteadiness on feet: Secondary | ICD-10-CM

## 2019-12-06 DIAGNOSIS — Z1211 Encounter for screening for malignant neoplasm of colon: Secondary | ICD-10-CM | POA: Diagnosis not present

## 2019-12-06 DIAGNOSIS — G894 Chronic pain syndrome: Secondary | ICD-10-CM

## 2019-12-06 DIAGNOSIS — M5442 Lumbago with sciatica, left side: Secondary | ICD-10-CM | POA: Diagnosis not present

## 2019-12-06 DIAGNOSIS — Z0000 Encounter for general adult medical examination without abnormal findings: Secondary | ICD-10-CM | POA: Diagnosis not present

## 2019-12-06 DIAGNOSIS — R296 Repeated falls: Secondary | ICD-10-CM | POA: Diagnosis not present

## 2019-12-06 DIAGNOSIS — R29898 Other symptoms and signs involving the musculoskeletal system: Secondary | ICD-10-CM

## 2019-12-06 MED ORDER — OXYCODONE HCL 15 MG PO TABS: 15.0000 mg | ORAL_TABLET | Freq: Three times a day (TID) | ORAL | 0 refills | Status: DC | PRN

## 2019-12-06 MED ORDER — CITALOPRAM HYDROBROMIDE 40 MG PO TABS: 20.0000 mg | ORAL_TABLET | Freq: Every day | ORAL | 1 refills | Status: DC

## 2019-12-06 NOTE — Progress Notes (Signed)
Subjective:   Melinda [name] is a 69 y.o. female who presents for Medicare Annual (Subsequent) preventive examination.  Review of Systems:  Cardiac Risk Factors include: advanced age (>65men, >12 women);obesity (BMI >30kg/m2);sedentary lifestyle;hypertension     Objective:     Vitals: There were no vitals taken for this visit.  There is no height or weight on file to calculate BMI.  Advanced Directives 12/06/2019 11/17/2019 11/02/2019 11/01/2019 09/21/2019 09/12/2019 05/10/2019  Does Patient Have a Medical Advance Directive? No No No No Yes No No  Type of Advance Directive - - - - (No Data) - -  Does patient want to make changes to medical advance directive? Yes (MAU/Ambulatory/Procedural Areas - Information given) No - Patient declined - - No - Patient declined - -  Copy of Healthcare Power of Attorney in Chart? - - - - - - -  Would patient like information on creating a medical advance directive? - - No - Patient declined - - - -    Tobacco Social History   Tobacco Use  Smoking Status Never Smoker  Smokeless Tobacco Never Used     Counseling given: Not Answered   Clinical Intake:  Pre-visit preparation completed: No  Pain : 0-10 Pain Score: (rates 20) Pain Type: Chronic pain Pain Location: Back(right breast and side) Pain Orientation: Right Pain Radiating Towards: neck and back of legs Pain Descriptors / Indicators: Constant, Sharp Pain Onset: Other (comment)(several years) Pain Frequency: Constant Pain Relieving Factors: oxycodone, Effect of Pain on Daily Activities: yes  Pain Relieving Factors: oxycodone,  BMI - recorded: 37.93 Nutritional Status: BMI > 30  Obese Nutritional Risks: None Diabetes: No  How often do you need to have someone help you when you read instructions, pamphlets, or other written materials from your doctor or pharmacy?: 1 - Never What is the last grade level you completed in school?: one year and half of college  Interpreter Needed?:  No  Information entered by :: Haruka Kowaleski FNP-C  Past Medical History:  Diagnosis Date   Acute kidney injury (HCC)    Allergy    Anxiety    Arthritis    "99% of my body" (11/11/2016)   Asthma    Bursitis of both hips    Cataract    Cervical scoliosis    Chronic back pain    "all over my back" (11/11/2016)   Depressive disorder, not elsewhere classified    Enthesopathy of hip region    Environmental allergies    "I take Claritin qd; 365 days/year" (11/11/2016)   Essential hypertension, benign    Fibromyalgia    Frequent falls    GERD (gastroesophageal reflux disease)    Headache    "at least 1/wk; may last for 2 days or so" (11/11/2016)   History of blood transfusion 1985   w/hysterectomy   Lumbar stenosis    Migraine    "a few/month" (11/11/2016)   Muscle weakness (generalized)    Myalgia and myositis, unspecified    Osteoporosis    Other abnormal blood chemistry    Other malaise and fatigue    Raynaud's syndrome    Sciatic nerve pain, left    Sciatica    Unspecified vitamin D deficiency    Past Surgical History:  Procedure Laterality Date   ABDOMINAL HYSTERECTOMY  1985   CESAREAN SECTION  1976; 1979   IR KYPHO LUMBAR INC FX REDUCE BONE BX UNI/BIL CANNULATION INC/IMAGING  11/04/2019   SHOULDER OPEN ROTATOR CUFF REPAIR Left  Family History  Problem Relation Age of Onset   Dementia Mother    Heart disease Mother    Hypertension Mother    Diabetes Mother    Diabetes Sister    Cancer Sister        lung   Cancer Other        Nepher (Mothers side)   Diabetes Maternal Grandmother    Arthritis Maternal Grandmother    Diabetes Cousin        Mother's side   Arthritis Sister    Arthritis Cousin        Mother's side    Colon cancer Neg Hx    Esophageal cancer Neg Hx    Liver cancer Neg Hx    Pancreatic cancer Neg Hx    Rectal cancer Neg Hx    Stomach cancer Neg Hx    Social History   Socioeconomic History    Marital status: Divorced    Spouse name: Not on file   Number of children: 3   Years of education: 13   Highest education level: Not on file  Occupational History    Comment: retired  Tobacco Use   Smoking status: Never Smoker   Smokeless tobacco: Never Used  Substance and Sexual Activity   Alcohol use: No    Alcohol/week: 0.0 standard drinks   Drug use: No   Sexual activity: Yes  Other Topics Concern   Not on file  Social History Narrative   Patient lives with her grandson. Patient is retired.   Education some college.   Right handed.    Caffeine two cups daily.   Social Determinants of Health   Financial Resource Strain:    Difficulty of Paying Living Expenses:   Food Insecurity:    Worried About Programme researcher, broadcasting/film/video in the Last Year:    Barista in the Last Year:   Transportation Needs:    Freight forwarder (Medical):    Lack of Transportation (Non-Medical):   Physical Activity:    Days of Exercise per Week:    Minutes of Exercise per Session:   Stress:    Feeling of Stress :   Social Connections:    Frequency of Communication with Friends and Family:    Frequency of Social Gatherings with Friends and Family:    Attends Religious Services:    Active Member of Clubs or Organizations:    Attends Banker Meetings:    Marital Status:     Outpatient Encounter Medications as of 12/06/2019  Medication Sig   acetaminophen (TYLENOL) 500 MG tablet Take 1,000 mg by mouth every 8 (eight) hours. For Pain   amitriptyline (ELAVIL) 150 MG tablet Take 1 tablet (150 mg total) by mouth at bedtime.   ascorbic acid (VITAMIN C) 500 MG tablet Take 1 tablet (500 mg total) by mouth 2 (two) times daily.   aspirin EC 81 MG tablet Take 81 mg by mouth daily.   carboxymethylcellulose (REFRESH PLUS) 0.5 % SOLN Place 2 drops into both eyes 2 (two) times daily as needed (dry eyes).    citalopram (CELEXA) 20 MG tablet Take 1 tablet (20 mg  total) by mouth daily.   [facility] D3 50 MCG (2000 UT) CAPS TAKE 1 CAPSULE BY MOUTH EVERY DAY   fluticasone (FLONASE) 50 MCG/ACT nasal spray PLACE 2 SPRAYS INTO BOTH NOSTRILS DAILY AS NEEDED FOR ALLERGIES OR RHINITIS.   furosemide (LASIX) 40 MG tablet Take 1 tablet (40 mg total) by mouth daily.  loratadine (CLARITIN) 10 MG tablet TAKE 1 TABLET BY MOUTH EVERY DAY   metoprolol tartrate (LOPRESSOR) 50 MG tablet TAKE ONE TABLET BY MOUTH TWICE DAILY FOR BLOOD PRESSURE   oxyCODONE (ROXICODONE) 15 MG immediate release tablet Take 1 tablet (15 mg total) by mouth every 8 (eight) hours as needed for pain.   SUMAtriptan (IMITREX) 25 MG tablet Take one tablet by mouth every 2 hours as needed for migraine or headache. May repeat in 2 hours if persists/recurs.   vitamin B-12 (CYANOCOBALAMIN) 500 MCG tablet Take 1 tablet (500 mcg total) by mouth daily.   No facility-administered encounter medications on file as of 12/06/2019.    Activities of Daily Living In your present state of health, do you have any difficulty performing the following activities: 12/06/2019 11/02/2019  Hearing? N N  Vision? N N  Difficulty concentrating or making decisions? N N  Walking or climbing stairs? Y Y  Comment back pain -  Dressing or bathing? N Y  Doing errands, shopping? Tempie Donning  Comment requires assitance with transportation -  Conservation officer, nature and eating ? N -  Using the Toilet? N -  In the past six months, have you accidently leaked urine? Y -  Do you have problems with loss of bowel control? N -  Managing your Medications? N -  Managing your Finances? N -  Housekeeping or managing your Housekeeping? Y -  Comment daughter assist -  Some recent data might be hidden    Patient Care Team: Taisha Pennebaker, Nelda Bucks, NP as PCP - General (Family Medicine)    Assessment:   This is a routine wellness examination for Loralyn.  Exercise Activities and Dietary recommendations Current Exercise Habits: Home exercise routine, Type of  exercise: stretching, Time (Minutes): 15, Frequency (Times/Week): 7, Weekly Exercise (Minutes/Week): 105, Intensity: Mild, Exercise limited by: Other - see comments(back pain)  Goals     Exercise 3x per week (30 min per time)     Pt would like to go back to the gym 3 days a week     Weight (lb) < 200 lb (90.7 kg)     Starting 05/28/16, I will attempt to lose 50 lbs, over the next year and strengthen my leg muscles, so I can stand better.        Fall Risk Fall Risk  12/06/2019 11/17/2019 10/05/2019 07/20/2019 07/04/2019  Falls in the past year? 1 1 1 1 1   Number falls in past yr: 1 1 1 1 1   Comment - - - - -  Injury with Fall? 1 1 1 1 1   Comment - - - - -  Risk Factor Category  - - - - -  Risk for fall due to : - - - - -  Follow up - - - - -   Is the patient's home free of loose throw rugs in walkways, pet beds, electrical cords, etc?   no      Grab bars in the bathroom? yes      Handrails on the stairs?   no steps has elevator       Adequate lighting?   yes  Depression Screen PHQ 2/9 Scores 12/06/2019 10/05/2019 07/20/2019 12/03/2018  PHQ - 2 Score 0 0 0 0  PHQ- 9 Score - - - -     Cognitive Function MMSE - Mini Mental State Exam 09/03/2018 08/14/2017 05/28/2016  Orientation to time 5 5 5   Orientation to Place 5 5 5   Registration 3 3 3  Attention/ Calculation 5 5 5   Recall 0 0 0  Language- name 2 objects 2 2 2   Language- repeat 1 1 1   Language- follow 3 step command 3 3 3   Language- read & follow direction 1 1 1   Write a sentence 1 1 1   Copy design 1 1 1   Total score 27 27 27      6CIT Screen 12/06/2019 12/03/2018  What Year? 0 points 0 points  What month? 0 points 0 points  What time? 0 points 0 points  Count back from 20 0 points 0 points  Months in reverse 0 points 0 points  Repeat phrase 6 points 0 points  Total Score 6 0    Immunization History  Administered Date(s) Administered   Fluad Quad(high Dose 65+) 03/29/2019   Influenza,inj,Quad PF,6+ Mos 05/23/2015,  05/28/2016, 03/24/2018   Influenza-Unspecified 07/07/2012, 02/28/2014   PFIZER SARS-COV-2 Vaccination 10/03/2019   Pneumococcal Conjugate-13 07/14/2014   Pneumococcal Polysaccharide-23 09/11/2017, 11/03/2019   Tdap 01/26/2016    Qualifies for Shingles Vaccine? Declines  Screening Tests Health Maintenance  Topic Date Due   COLONOSCOPY  Never done   MAMMOGRAM  09/04/2019   COVID-19 Vaccine (2 - Pfizer 2-dose series) 10/24/2019   INFLUENZA VACCINE  01/29/2020   TETANUS/TDAP  01/25/2026   DEXA SCAN  Completed   Hepatitis C Screening  Completed   PNA vac Low Risk Adult  Completed    Cancer Screenings: Lung: Low Dose CT Chest recommended if Age 10-80 years, 30 pack-year currently smoking OR have quit w/in 15years. Patient does not qualify. Breast:  Up to date on Mammogram? Yes   Up to date of Bone Density/Dexa? Yes Colorectal: Referral order today   Additional Screenings:  Hepatitis C Screening: Completed      Plan:   - Referral To Gastroenterology for colonoscopy  -  Declined Shingrix vaccine  - Due for second dose of COVID-19 vaccine   I have personally reviewed and noted the following in the patients chart:    Medical and social history  Use of alcohol, tobacco or illicit drugs   Current medications and supplements  Functional ability and status  Nutritional status  Physical activity  Advanced directives  List of other physicians  Hospitalizations, surgeries, and ER visits in previous 12 months  Vitals  Screenings to include cognitive, depression, and falls  Referrals and appointments  In addition, I have reviewed and discussed with patient certain preventive protocols, quality metrics, and best practice recommendations. A written personalized care plan for preventive services as well as general preventive health recommendations were provided to patient.    07/16/2014, NP  12/06/2019

## 2019-12-06 NOTE — Progress Notes (Signed)
This service is provided via telemedicine  No vital signs collected/recorded due to the encounter was a telemedicine visit.   Location of patient (ex: home, work):  Home  Patient consents to a telephone visit:  Yes, see telephone encounter dated 12/06/2019 with annual consent    Location of the provider (ex: office, home):  [facility], Office   Name of any referring provider:  N/A  Names of all persons participating in the telemedicine service and their role in the encounter: Cleave Ternes, NP, Chrae Beatty/CMA, and Patient  Time spent on call: 15 min with medical assistant   Provider: Laydon Martis FNP-C  Lindsee Labarre, Nelda Bucks, NP  Patient Care Team: Markeis Allman, Nelda Bucks, NP as PCP - General (Family Medicine) Jon Billings, RN as [facility]  Extended Emergency Contact Information Primary Emergency Contact: [place] Phone: [phone] Relation: Son Secondary Emergency Contact: [place] Phone: [phone] Relation: Daughter  Code Status: Full Code  Goals of care: Advanced Directive information Advanced Directives 12/08/2019  Does Patient Have a Medical Advance Directive? No  Type of Advance Directive -  Does patient want to make changes to medical advance directive? -  Copy of [place] in Chart? -  Would patient like information on creating a medical advance directive? -     Chief Complaint  Patient presents with  . Acute Visit    request scooter; oxycodone refill ; Depression   . Pain    Patient c/o pain in generalized areas of body. Patient fell yesterday and has pain on right side as a result    HPI:  Pt is a 69 y.o. female seen today for an acute visit for evaluation of fall and request order for a scooter.she also request oxycodone refill.she states trippled on her walker while waiting for the bus yesterday.denies  hitting head.she complains of generalized pain request oxycodone frequency to be increased.states requires pain medication more often since having her back surgery.she request order for a scooter to be send to a [place] states unable to walk long distance due to generalized pain and recent back pain.she uses a [employer] but has had frequent falls. I've discussed with patient high risk for use of oxycodone risk for falls but patient very upset with provider during visit demands a quantity of 120 tablets per month.  Past Medical History:  Diagnosis Date  . Acute kidney injury ([place])   . Allergy   . Anxiety   . Arthritis    "99% of my body" (11/11/2016)  . Asthma   . Bursitis of both hips   . Cataract   . Cervical scoliosis   . Chronic back pain    "all over my back" (11/11/2016)  . Depressive disorder, not elsewhere classified   . Enthesopathy of hip region   . Environmental allergies    "I take Claritin qd; 365 days/year" (11/11/2016)  . Essential hypertension, benign   . Fibromyalgia   . Frequent falls   . GERD (gastroesophageal reflux disease)   . Headache    "at least 1/wk; may last for 2 days or so" (11/11/2016)  . History of blood transfusion 1985   w/hysterectomy  . Lumbar stenosis   . Migraine    "a few/month" (11/11/2016)  . Muscle weakness (generalized)   . Myalgia and myositis, unspecified   . Osteoporosis   . Other abnormal blood chemistry   . Other  malaise and fatigue   . Raynaud's syndrome   . Sciatic nerve pain, left   . Sciatica   . Unspecified vitamin D deficiency    Past Surgical History:  Procedure Laterality Date  . ABDOMINAL HYSTERECTOMY  1985  . CESAREAN SECTION  1976; 1979  . IR KYPHO LUMBAR INC FX REDUCE BONE BX UNI/BIL CANNULATION INC/IMAGING  11/04/2019  . SHOULDER OPEN ROTATOR CUFF REPAIR Left     No Known Allergies  Outpatient Encounter Medications as of 12/06/2019  Medication Sig  . acetaminophen (TYLENOL) 500 MG tablet Take 1,000 mg by mouth  every 8 (eight) hours. For Pain  . amitriptyline (ELAVIL) 150 MG tablet Take 1 tablet (150 mg total) by mouth at bedtime.  Marland Kitchen ascorbic acid (VITAMIN C) 500 MG tablet Take 1 tablet (500 mg total) by mouth 2 (two) times daily.  Marland Kitchen aspirin EC 81 MG tablet Take 81 mg by mouth daily.  . carboxymethylcellulose (REFRESH PLUS) 0.5 % SOLN Place 2 drops into both eyes 2 (two) times daily as needed (dry eyes).   . citalopram (CELEXA) 40 MG tablet Take 0.5 tablets (20 mg total) by mouth daily.  . [employer] D3 50 MCG (2000 UT) CAPS TAKE 1 CAPSULE BY MOUTH EVERY DAY  . fluticasone (FLONASE) 50 MCG/ACT nasal spray PLACE 2 SPRAYS INTO BOTH NOSTRILS DAILY AS NEEDED FOR ALLERGIES OR RHINITIS.  . furosemide (LASIX) 40 MG tablet Take 1 tablet (40 mg total) by mouth daily.  Marland Kitchen loratadine (CLARITIN) 10 MG tablet TAKE 1 TABLET BY MOUTH EVERY DAY  . metoprolol tartrate (LOPRESSOR) 50 MG tablet TAKE ONE TABLET BY MOUTH TWICE DAILY FOR BLOOD PRESSURE  . SUMAtriptan (IMITREX) 25 MG tablet Take one tablet by mouth every 2 hours as needed for migraine or headache. May repeat in 2 hours if persists/recurs.  . vitamin B-12 (CYANOCOBALAMIN) 500 MCG tablet Take 1 tablet (500 mcg total) by mouth daily.  . [DISCONTINUED] citalopram (CELEXA) 20 MG tablet Take 1 tablet (20 mg total) by mouth daily.  . [DISCONTINUED] oxyCODONE (ROXICODONE) 15 MG immediate release tablet Take 1 tablet (15 mg total) by mouth every 8 (eight) hours as needed for pain.  . [DISCONTINUED] oxyCODONE (ROXICODONE) 15 MG immediate release tablet Take 1 tablet (15 mg total) by mouth every 8 (eight) hours as needed for pain.  . [DISCONTINUED] lidocaine (LIDODERM) 5 % Place 1 patch onto the skin every 12 (twelve) hours. Remove & Discard patch within 12 hours or as directed by MD. To area of greatest pain in the back  . [DISCONTINUED] senna-docusate (SENOKOT-S) 8.6-50 MG tablet Take 1 tablet by mouth at bedtime as needed for mild constipation.   No facility-administered  encounter medications on file as of 12/06/2019.    Review of Systems  Constitutional: Negative for appetite change, chills, fatigue and fever.  HENT: Negative for congestion, rhinorrhea, sinus pressure, sinus pain, sneezing and sore throat.   Respiratory: Negative for cough, chest tightness, shortness of breath and wheezing.   Cardiovascular: Negative for chest pain, palpitations and leg swelling.  Gastrointestinal: Negative for abdominal distention, abdominal pain, constipation, diarrhea, nausea and vomiting.  Endocrine: Negative for cold intolerance, heat intolerance, polydipsia, polyphagia and polyuria.  Genitourinary: Negative for difficulty urinating, dysuria, flank pain, frequency and urgency.  Musculoskeletal: Positive for arthralgias, back pain and gait problem. Negative for joint swelling and myalgias.  Skin: Negative for color change, pallor and rash.  Neurological: Negative for dizziness, speech difficulty, weakness, light-headedness, numbness and headaches.  Hematological: Does not bruise/bleed easily.  Psychiatric/Behavioral: Negative for agitation, confusion and sleep disturbance. The patient is not nervous/anxious.     Immunization History  Administered Date(s) Administered  . Fluad Quad(high Dose 65+) 03/29/2019  . Influenza,inj,Quad PF,6+ Mos 05/23/2015, 05/28/2016, 03/24/2018  . Influenza-Unspecified 07/07/2012, 02/28/2014  . PFIZER SARS-COV-2 Vaccination 10/03/2019  . Pneumococcal Conjugate-13 07/14/2014  . Pneumococcal Polysaccharide-23 09/11/2017, 11/03/2019  . Tdap 01/26/2016   Pertinent  Health Maintenance Due  Topic Date Due  . COLONOSCOPY  Never done  . MAMMOGRAM  09/04/2019  . INFLUENZA VACCINE  01/29/2020  . DEXA SCAN  Completed  . PNA vac Low Risk Adult  Completed   Fall Risk  12/06/2019 11/17/2019 10/05/2019 07/20/2019 07/04/2019  Falls in the past year? 1 1 1 1 1   Number falls in past yr: 1 1 1 1 1   Comment - - - - -  Injury with Fall? 1 1 1 1 1   Comment -  - - - -  Risk Factor Category  - - - - -  Risk for fall due to : - - - - -  Follow up - - - - -    There were no vitals filed for this visit. There is no height or weight on file to calculate BMI. Physical Exam Unable to complete on telephone visit.  Labs reviewed: Recent Labs    11/02/19 0244 11/03/19 0235 11/04/19 0307  NA 143 139 135  K 3.7 4.5 4.5  CL 104 105 103  CO2 30 26 24   GLUCOSE 102* 93 97  BUN 14 13 13   CREATININE 1.25* 1.40* 1.42*  CALCIUM 9.1 8.6* 8.5*  MG  --  2.2 2.1  PHOS  --  3.6  --    Recent Labs    09/12/19 1240 09/12/19 1240 10/05/19 0942 11/03/19 0235 11/04/19 0307  AST 17   < > 15 12* 12*  ALT 16   < > 19 9 10   ALKPHOS 71  --   --  54 54  BILITOT 0.6   < > 0.7 0.5 0.7  PROT 7.6   < > 7.0 6.3* 6.1*  ALBUMIN 4.2  --   --  3.5 3.4*   < > = values in this interval not displayed.   Recent Labs    09/12/19 1240 09/22/19 0000 10/05/19 0942 10/05/19 0942 11/01/19 1352 11/01/19 1352 11/02/19 0244 11/03/19 0235 11/04/19 0307  WBC   < > 5.7 4.9   < > 6.9   < > 6.5 6.5 6.6  NEUTROABS  --  2 1,950  --  3.0  --   --   --   --   HGB   < > 11.0* 12.1   < > 11.6*   < > 11.0* 9.8* 10.0*  HCT   < > 34* 37.0   < > 38.0   < > 35.8* 33.1* 32.9*  MCV   < >  --  85.1   < > 90.7   < > 90.9 92.2 92.7  PLT   < > 315 386   < > 311   < > 292 300 305   < > = values in this interval not displayed.   Lab Results  Component Value Date   TSH 1.58 10/05/2019   Lab Results  Component Value Date   HGBA1C 5.3 03/29/2019   Lab Results  Component Value Date   CHOL 194 10/05/2019   HDL 46 (L) 10/05/2019   LDLCALC 127 (H) 10/05/2019   TRIG 107 10/05/2019  CHOLHDL 4.2 10/05/2019    Significant Diagnostic Results in last 30 days:  CT Head Wo Contrast  Result Date: 12/08/2019 CLINICAL DATA:  Larey Seat and hit head today. EXAM: CT HEAD WITHOUT CONTRAST TECHNIQUE: Contiguous axial images were obtained from the base of the skull through the vertex without  intravenous contrast. COMPARISON:  Head CT 11/01/2019 FINDINGS: Brain: No evidence of acute infarction, hemorrhage, hydrocephalus, extra-axial collection or mass lesion/mass effect. Empty sella noted. Vascular: Mild vascular calcifications but no aneurysm or hyperdense vessels. Skull: No acute skull fracture or bone lesion. Sinuses/Orbits: The paranasal sinuses and mastoid air cells are clear. The temporal bones are diffusely aerated. The globes are intact. Other: Small left periorbital scalp hematoma noted. IMPRESSION: 1. No acute intracranial findings or skull fracture. 2. Small left periorbital scalp hematoma. Electronically Signed   By: Rudie Meyer M.D.   On: 12/08/2019 15:57   CT Lumbar Spine Wo Contrast  Result Date: 12/08/2019 CLINICAL DATA:  Low back pain. History of recent kyphoplasty now with recurrent back pain. Patient fell recently. EXAM: CT LUMBAR SPINE WITHOUT CONTRAST TECHNIQUE: Multidetector CT imaging of the lumbar spine was performed without intravenous contrast administration. Multiplanar CT image reconstructions were also generated. COMPARISON:  MRI lumbar spine 11/01/2019 FINDINGS: Segmentation: There are five lumbar type vertebral bodies. The last full intervertebral disc space is labeled L5-S1. This correlates with the prior MRI examination. Alignment: Normal Vertebrae: Vertebral augmentation changes are noted at L1. There is some bone cement noted in the epidural space but the spinal canal is fairly generous and I do not see any significant spinal stenosis. No acute lumbar fractures identified. Stable moderate facet disease. Paraspinal and other soft tissues: No significant paraspinal or retroperitoneal findings. Scattered aortic calcifications. Disc levels: T12-L1: No significant findings. L1-2: No significant findings. L2-3: No significant findings. L3-4: Mild diffuse annular bulge but no significant spinal or foraminal stenosis. Mild facet disease. L4-5: Bulging annulus and moderate  facet disease with mild spinal and bilateral lateral recess stenosis. No significant foraminal stenosis. L5-S1: No significant findings. IMPRESSION: 1. Vertebral augmentation changes at L1. There is some bone cement noted in the epidural space but the spinal canal is fairly generous and I do not see any significant spinal stenosis. 2. No acute bony findings. 3. Mild spinal and bilateral lateral recess stenosis at L4-5. 4. Aortic atherosclerosis. Aortic Atherosclerosis (ICD10-I70.0). Electronically Signed   By: Rudie Meyer M.D.   On: 12/08/2019 15:54   DG Shoulder Left  Result Date: 12/08/2019 CLINICAL DATA:  Left shoulder pain after fall EXAM: LEFT SHOULDER - 2+ VIEW COMPARISON:  08/14/2016 FINDINGS: There is no evidence of fracture or dislocation. Moderate arthropathy of the [facility] joint. Glenohumeral joint appears within normal limits. Soft tissues are unremarkable. IMPRESSION: Moderate arthropathy of the left AC joint. No acute fracture or dislocation. Electronically Signed   By: Duanne Guess D.O.   On: 12/08/2019 15:57   DG Hip Unilat W or Wo Pelvis 2-3 Views Left  Result Date: 12/08/2019 CLINICAL DATA:  Status post fall. EXAM: DG HIP (WITH OR WITHOUT PELVIS) 2-3V LEFT COMPARISON:  April 06, 2018 FINDINGS: There is no evidence of hip fracture or dislocation. There is no evidence of arthropathy or other focal bone abnormality. IMPRESSION: Negative. Electronically Signed   By: Aram Candela M.D.   On: 12/08/2019 15:58    Assessment/Plan 1. Severe episode of recurrent major depressive disorder, without psychotic features (HCC) Mood stable.request refill for Celexa as below. - citalopram (CELEXA) 40 MG tablet; Take 0.5  tablets (20 mg total) by mouth daily.  Dispense: 90 tablet; Refill: 1  2. Chronic pain syndrome Requests oxycodone 15 mg IR to every 6 hours instead of every 8 hrs.Discussed with patient risk for increasing frequency due to her high risk of frequent falls.Patient very upset  with provider.Recommended referral to pain management. - Ambulatory referral to Pain Clinic - For home use only DME Other see comment; Scooter script written and faxed by CMA to  DME company per patient's request no particular DME company.  3. Chronic bilateral low back pain with bilateral sciatica Status post Kyphoplasty  - Ambulatory referral to Pain Clinic - For home use only DME Other see comment: Scooter script faxed to DME company as above.  4. Weakness of both lower extremities Request scooter for long distant. - For home use only DME Other see comment  5. Unsteady gait Has had frequent falls.Fall and safety precaution advised. - For home use only DME Other see comment  6. Frequent falls Reports fall episode while waiting for the bus.No injuries sustained.  - For home use only DME Other see comment  Family/ staff Communication: Reviewed plan of care with patient but was upset that provider didn't increase her oxycodone to every 6 hrs due to her frequent fall episodes.   Labs/tests ordered: None   Next Appointment: As needed if symptoms worsen or fail to improve.  Spent 22 minutes of non-face to face with patient   I connected with  Lorraina Spring on 12/11/19 by a telephone enabled telemedicine application and verified that I am speaking with the correct person using two identifiers.   I discussed the limitations of evaluation and management by telemedicine. The patient expressed understanding and agreed to proceed.  Caesar Bookman, NP

## 2019-12-06 NOTE — Telephone Encounter (Signed)
[name], firestine are scheduled for a virtual visit with your provider today.    Just as we do with appointments in the office, we must obtain your consent to participate.  Your consent will be active for this visit and any virtual visit you may have with one of our providers in the next 365 days.    If you have a MyChart account, I can also send a copy of this consent to you electronically.  All virtual visits are billed to your insurance company just like a traditional visit in the office.  As this is a virtual visit, video technology does not allow for your provider to perform a traditional examination.  This may limit your provider's ability to fully assess your condition.  If your provider identifies any concerns that need to be evaluated in person or the need to arrange testing such as labs, EKG, etc, we will make arrangements to do so.    Although advances in technology are sophisticated, we cannot ensure that it will always work on either your end or our end.  If the connection with a video visit is poor, we may have to switch to a telephone visit.  With either a video or telephone visit, we are not always able to ensure that we have a secure connection.   I need to obtain your verbal consent now.   Are you willing to proceed with your visit today?   Melinda [name] has provided verbal consent on 12/06/2019 for a virtual visit (video or telephone).   Edison Simon Lowes, [place] 12/06/2019  2:00 pm

## 2019-12-06 NOTE — Progress Notes (Signed)
   This service is provided via telemedicine  No vital signs collected/recorded due to the encounter was a telemedicine visit.   Location of patient (ex: home, work):  Home  Patient consents to a telephone visit:  Yes, see telephone encounter dated 12/06/2019 with annual consent   Location of the provider (ex: office, home):  Home  Name of any referring provider:  N/A  Names of all persons participating in the telemedicine service and their role in the encounter:  Richarda Blade, NP, Chrae Reuven Braver/CMA, and Patient  Time spent on call:  15 min with medical assistant

## 2019-12-06 NOTE — Patient Instructions (Signed)
Melinda [name] , Thank you for taking time to come for your Medicare Wellness Visit. I appreciate your ongoing commitment to your health goals. Please review the following plan we discussed and let me know if I can assist you in the future.   Screening recommendations/referrals: Colonoscopy: Ordered this visit  Mammogram: Up to d ate  Bone Density : Up to d ate  Recommended yearly ophthalmology/optometry visit for glaucoma screening and checkup Recommended yearly dental visit for hygiene and checkup  Vaccinations: Influenza vaccine : Up to d ate  Pneumococcal vaccine : Up to d ate  Tdap vaccine : Up to d ate  Shingles vaccine : Declined   Advanced directives: No   Conditions/risks identified: Advance age female > 60 yrs,Obesity BMI > 30,Hypertension   Next appointment: 1 year    Preventive Care 75 Years and Older, Female Preventive care refers to lifestyle choices and visits with your health care provider that can promote health and wellness. What does preventive care include?  A yearly physical exam. This is also called an annual well check.  Dental exams once or twice a year.  Routine eye exams. Ask your health care provider how often you should have your eyes checked.  Personal lifestyle choices, including:  Daily care of your teeth and gums.  Regular physical activity.  Eating a healthy diet.  Avoiding tobacco and drug use.  Limiting alcohol use.  Practicing safe sex.  Taking low-dose aspirin every day.  Taking vitamin and mineral supplements as recommended by your health care provider. What happens during an annual well check? The services and screenings done by your health care provider during your annual well check will depend on your age, overall health, lifestyle risk factors, and family history of disease. Counseling  Your health care provider may ask you questions about your:  Alcohol use.  Tobacco use.  Drug use.  Emotional well-being.  Home  and relationship well-being.  Sexual activity.  Eating habits.  History of falls.  Memory and ability to understand (cognition).  Work and work Statistician.  Reproductive health. Screening  You may have the following tests or measurements:  Height, weight, and BMI.  Blood pressure.  Lipid and cholesterol levels. These may be checked every 5 years, or more frequently if you are over 42 years old.  Skin check.  Lung cancer screening. You may have this screening every year starting at age 84 if you have a 30-pack-year history of smoking and currently smoke or have quit within the past 15 years.  Fecal occult blood test (FOBT) of the stool. You may have this test every year starting at age 81.  Flexible sigmoidoscopy or colonoscopy. You may have a sigmoidoscopy every 5 years or a colonoscopy every 10 years starting at age 25.  Hepatitis C blood test.  Hepatitis B blood test.  Sexually transmitted disease (STD) testing.  Diabetes screening. This is done by checking your blood sugar (glucose) after you have not eaten for a while (fasting). You may have this done every 1-3 years.  Bone density scan. This is done to screen for osteoporosis. You may have this done starting at age 66.  Mammogram. This may be done every 1-2 years. Talk to your health care provider about how often you should have regular mammograms. Talk with your health care provider about your test results, treatment options, and if necessary, the need for more tests. Vaccines  Your health care provider may recommend certain vaccines, such as:  Influenza vaccine. This  is recommended every year.  Tetanus, diphtheria, and acellular pertussis (Tdap, Td) vaccine. You may need a Td booster every 10 years.  Zoster vaccine. You may need this after age 17.  Pneumococcal 13-valent conjugate (PCV13) vaccine. One dose is recommended after age 27.  Pneumococcal polysaccharide (PPSV23) vaccine. One dose is recommended  after age 36. Talk to your health care provider about which screenings and vaccines you need and how often you need them. This information is not intended to replace advice given to you by your health care provider. Make sure you discuss any questions you have with your health care provider. Document Released: 07/13/2015 Document Revised: 03/05/2016 Document Reviewed: 04/17/2015 Elsevier Interactive Patient Education  2017 [employer].  Fall Prevention in the Home Falls can cause injuries. They can happen to people of all ages. There are many things you can do to make your home safe and to help prevent falls. What can I do on the outside of my home?  Regularly fix the edges of walkways and driveways and fix any cracks.  Remove anything that might make you trip as you walk through a door, such as a raised step or threshold.  Trim any bushes or trees on the path to your home.  Use bright outdoor lighting.  Clear any walking paths of anything that might make someone trip, such as rocks or tools.  Regularly check to see if handrails are loose or broken. Make sure that both sides of any steps have handrails.  Any raised decks and porches should have guardrails on the edges.  Have any leaves, snow, or ice cleared regularly.  Use sand or salt on walking paths during winter.  Clean up any spills in your garage right away. This includes oil or grease spills. What can I do in the bathroom?  Use night lights.  Install grab bars by the toilet and in the tub and shower. Do not use towel bars as grab bars.  Use non-skid mats or decals in the tub or shower.  If you need to sit down in the shower, use a plastic, non-slip stool.  Keep the floor dry. Clean up any water that spills on the floor as soon as it happens.  Remove soap buildup in the tub or shower regularly.  Attach bath mats securely with double-sided non-slip rug tape.  Do not have throw rugs and other things on the floor  that can make you trip. What can I do in the bedroom?  Use night lights.  Make sure that you have a light by your bed that is easy to reach.  Do not use any sheets or blankets that are too big for your bed. They should not hang down onto the floor.  Have a firm chair that has side arms. You can use this for support while you get dressed.  Do not have throw rugs and other things on the floor that can make you trip. What can I do in the kitchen?  Clean up any spills right away.  Avoid walking on wet floors.  Keep items that you use a lot in easy-to-reach places.  If you need to reach something above you, use a strong step stool that has a grab bar.  Keep electrical cords out of the way.  Do not use floor polish or wax that makes floors slippery. If you must use wax, use non-skid floor wax.  Do not have throw rugs and other things on the floor that can make you  trip. What can I do with my stairs?  Do not leave any items on the stairs.  Make sure that there are handrails on both sides of the stairs and use them. Fix handrails that are broken or loose. Make sure that handrails are as long as the stairways.  Check any carpeting to make sure that it is firmly attached to the stairs. Fix any carpet that is loose or worn.  Avoid having throw rugs at the top or bottom of the stairs. If you do have throw rugs, attach them to the floor with carpet tape.  Make sure that you have a light switch at the top of the stairs and the bottom of the stairs. If you do not have them, ask someone to add them for you. What else can I do to help prevent falls?  Wear shoes that:  Do not have high heels.  Have rubber bottoms.  Are comfortable and fit you well.  Are closed at the toe. Do not wear sandals.  If you use a stepladder:  Make sure that it is fully opened. Do not climb a closed stepladder.  Make sure that both sides of the stepladder are locked into place.  Ask someone to hold it  for you, if possible.  Clearly mark and make sure that you can see:  Any grab bars or handrails.  First and last steps.  Where the edge of each step is.  Use tools that help you move around (mobility aids) if they are needed. These include:  Canes.  Walkers.  Scooters.  Crutches.  Turn on the lights when you go into a dark area. Replace any light bulbs as soon as they burn out.  Set up your furniture so you have a clear path. Avoid moving your furniture around.  If any of your floors are uneven, fix them.  If there are any pets around you, be aware of where they are.  Review your medicines with your doctor. Some medicines can make you feel dizzy. This can increase your chance of falling. Ask your doctor what other things that you can do to help prevent falls. This information is not intended to replace advice given to you by your health care provider. Make sure you discuss any questions you have with your health care provider. Document Released: 04/12/2009 Document Revised: 11/22/2015 Document Reviewed: 07/21/2014 Elsevier Interactive Patient Education  2017 [employer].

## 2019-12-07 ENCOUNTER — Telehealth: Payer: Self-pay | Admitting: *Deleted

## 2019-12-07 MED ORDER — OXYCODONE HCL 15 MG PO TABS: 15.0000 mg | ORAL_TABLET | Freq: Three times a day (TID) | ORAL | 0 refills | Status: DC | PRN

## 2019-12-07 NOTE — Telephone Encounter (Signed)
Re send script  

## 2019-12-07 NOTE — Telephone Encounter (Signed)
[place] with [facility] called and stated that they received a Rx yesterday for Oxycodone.  [facility] cannot refill the Oxycodone due to Controlled Medication.  Stated you will have to send to different pharmacy other than a mail order.  Patient [facility].  Pended Rx and sent to [facility] for approval.

## 2019-12-08 ENCOUNTER — Other Ambulatory Visit: Payer: Self-pay

## 2019-12-08 ENCOUNTER — Emergency Department (HOSPITAL_COMMUNITY)
Admission: EM | Admit: 2019-12-08 | Discharge: 2019-12-08 | Disposition: A | Discharge status: Home / Self Care | Payer: Medicare HMO | Attending: Emergency Medicine | Admitting: Emergency Medicine

## 2019-12-08 ENCOUNTER — Encounter (HOSPITAL_COMMUNITY): Payer: Self-pay

## 2019-12-08 ENCOUNTER — Emergency Department (HOSPITAL_COMMUNITY): Payer: Medicare HMO

## 2019-12-08 DIAGNOSIS — R42 Dizziness and giddiness: Secondary | ICD-10-CM | POA: Diagnosis not present

## 2019-12-08 DIAGNOSIS — Y92480 Sidewalk as the place of occurrence of the external cause: Secondary | ICD-10-CM | POA: Insufficient documentation

## 2019-12-08 DIAGNOSIS — W010XXA Fall on same level from slipping, tripping and stumbling without subsequent striking against object, initial encounter: Secondary | ICD-10-CM | POA: Diagnosis not present

## 2019-12-08 DIAGNOSIS — S0990XA Unspecified injury of head, initial encounter: Secondary | ICD-10-CM | POA: Diagnosis present

## 2019-12-08 DIAGNOSIS — Z7982 Long term (current) use of aspirin: Secondary | ICD-10-CM | POA: Diagnosis not present

## 2019-12-08 DIAGNOSIS — J45909 Unspecified asthma, uncomplicated: Secondary | ICD-10-CM | POA: Insufficient documentation

## 2019-12-08 DIAGNOSIS — W19XXXA Unspecified fall, initial encounter: Secondary | ICD-10-CM

## 2019-12-08 DIAGNOSIS — Y998 Other external cause status: Secondary | ICD-10-CM | POA: Diagnosis not present

## 2019-12-08 DIAGNOSIS — I10 Essential (primary) hypertension: Secondary | ICD-10-CM | POA: Insufficient documentation

## 2019-12-08 DIAGNOSIS — S0003XA Contusion of scalp, initial encounter: Secondary | ICD-10-CM | POA: Diagnosis not present

## 2019-12-08 DIAGNOSIS — F29 Unspecified psychosis not due to a substance or known physiological condition: Secondary | ICD-10-CM | POA: Diagnosis not present

## 2019-12-08 DIAGNOSIS — Y9301 Activity, walking, marching and hiking: Secondary | ICD-10-CM | POA: Insufficient documentation

## 2019-12-08 DIAGNOSIS — S79912A Unspecified injury of left hip, initial encounter: Secondary | ICD-10-CM | POA: Diagnosis not present

## 2019-12-08 DIAGNOSIS — Z79899 Other long term (current) drug therapy: Secondary | ICD-10-CM | POA: Insufficient documentation

## 2019-12-08 DIAGNOSIS — M2550 Pain in unspecified joint: Secondary | ICD-10-CM | POA: Diagnosis not present

## 2019-12-08 DIAGNOSIS — R52 Pain, unspecified: Secondary | ICD-10-CM | POA: Diagnosis not present

## 2019-12-08 DIAGNOSIS — S0083XA Contusion of other part of head, initial encounter: Secondary | ICD-10-CM | POA: Diagnosis not present

## 2019-12-08 DIAGNOSIS — Z7401 Bed confinement status: Secondary | ICD-10-CM | POA: Diagnosis not present

## 2019-12-08 DIAGNOSIS — S3992XA Unspecified injury of lower back, initial encounter: Secondary | ICD-10-CM | POA: Diagnosis not present

## 2019-12-08 DIAGNOSIS — M25512 Pain in left shoulder: Secondary | ICD-10-CM | POA: Diagnosis not present

## 2019-12-08 MED ORDER — ACETAMINOPHEN 325 MG PO TABS
650.0000 mg | ORAL_TABLET | Freq: Once | ORAL | Status: AC
  Administered 2019-12-08: 650 mg via ORAL
  Filled 2019-12-08: qty 2

## 2019-12-08 NOTE — ED Notes (Signed)
This nurse was able to contact emergency maintenance from patients apartment complex who will assist PTAR getting patient into home. Called [phone]

## 2019-12-08 NOTE — Progress Notes (Signed)
Orthopedic Tech Progress Note Patient Details:  Melinda [name] January 15, 1951 [id number]  Patient ID: Melinda [name], female   DOB: 04-04-1951, 69 y.o.   MRN: [id number]   [name] 12/08/2019, 6:12 PM lso brace ordered from [facility] @1650 

## 2019-12-08 NOTE — ED Notes (Signed)
As this RN was administering the Tylenol, pt states "This IS NOT going to help me" MD aware.

## 2019-12-08 NOTE — TOC Initial Note (Addendum)
Transition of Care [facility]) - Initial/Assessment Note    Patient Details  Name: Melinda [name] MRN: [id number] Date of Birth: Mar 16, 1951  Transition of Care [facility]) CM/SW Contact:    Erenest Rasher, RN Phone Number: [phone] 12/08/2019, 4:43 PM  Clinical Narrative:                  TOC CM spoke to pt at bedside, she prefers to go home. Offered choice for [facility]. She is agreeable to [facility]. Contacted HH rep, Ronalee Belts with new referral. Pt has DME at home. Gave permission to speak to daughters. Pt will PTAR transport home.     Expected Discharge Plan: [place] Barriers to Discharge: No Barriers Identified   Patient Goals and CMS Choice Patient states their goals for this hospitalization and ongoing recovery are:: wish her children would help her more at home CMS Medicare.gov Compare Post Acute Care list provided to:: Patient Choice offered to / list presented to : Patient  Expected Discharge Plan and Services Expected Discharge Plan: [place] In-house Referral: Clinical Social Work Discharge Planning Services: CM Consult Post Acute Care Choice: [place] arrangements for the past 2 months: Single Family Home                           HH Arranged: RN, PT, OT, Nurse's Aide, Social Work [employer] Agency: [facility] [facility] [employer] Date Walker: 12/08/19      Prior Living Arrangements/Services Living arrangements for the past 2 months: [place] with:: Self Patient language and need for interpreter reviewed:: Yes Do you feel safe going back to the place where you live?: Yes      Need for Family Participation in Patient Care: Yes (Comment) Care giver support system in place?: No (comment) Current home services: DME (rollator, rolling walker, cane, bedside commode) Criminal Activity/Legal Involvement Pertinent to Current Situation/Hospitalization: No - Comment as  needed  Activities of Daily Living      Permission Sought/Granted Permission sought to share information with : Case Manager, Facility Sport and exercise psychologist, PCP, Family Supports Permission granted to share information with : Yes, Verbal Permission Granted  Share Information with NAME: Earleen Newport  Permission granted to share info w AGENCY: [facility] [employer] granted to share info w Relationship: daughter  Permission granted to share info w Contact Information: [phone]  Emotional Assessment Appearance:: Appears stated age Attitude/Demeanor/Rapport: Engaged Affect (typically observed): Accepting Orientation: : Oriented to Self, Oriented to Place, Oriented to  Time, Oriented to Situation   Psych Involvement: No (comment)  Admission diagnosis:  Fall Patient Active Problem List   Diagnosis Date Noted  . Chronic back pain   . Closed compression fracture of first lumbar vertebra ([place]) 11/02/2019  . Anxiety and depression 11/02/2019  . Hypokalemia 11/02/2019  . Dry eyes, bilateral 10/05/2019  . Mixed hyperlipidemia 10/05/2019  . Lower extremity weakness 09/18/2019  . Migraine headache 09/18/2019  . Acute kidney injury ([place]) 05/10/2019  . Near syncope 05/09/2019  . Fall at home, initial encounter 05/09/2019  . History of 2019 novel coronavirus disease (COVID-19) 05/09/2019  . Anemia of chronic disease 05/09/2019  . Acute on chronic renal insufficiency 05/09/2019  . COVID-19 virus infection 10/12/2018  . CAP (community acquired pneumonia) 10/06/2018  . Polyneuropathy in diseases classified elsewhere ([place]) 10/04/2018  . Prediabetes 09/12/2017  . High risk medication use 09/12/2017  .  Frequent falls 09/12/2017  . Chronic pain syndrome 08/14/2017  . Gastroesophageal reflux disease 08/14/2017  . Cough variant asthma 03/14/2017  . Pulmonary hypertension (HCC) 03/12/2017  . Right-sided chest pain 11/11/2016  . Asthma 11/11/2016  . Atypical chest pain   .  Costochondritis   . Chronic congestive heart failure (HCC)   . MDD (major depressive disorder), recurrent episode (HCC) 05/23/2015  . Allergic rhinitis 11/03/2014  . Paresthesia 09/08/2014  . Peripheral neuropathy 07/14/2014  . Morbid obesity due to excess calories (HCC) 07/14/2014  . Pain in limb 07/13/2014  . Abnormality of gait 07/13/2014  . Fibromyalgia 06/13/2014  . Spinal stenosis 01/27/2013  . HTN (hypertension) 11/30/2012   PCP:  Caesar Bookman, NP Pharmacy:   [facility] - [place], [place] - [address] [place] [address] Phone: [phone] Fax: [phone]  [id number] [place], [place] - [address] [place] [place] [address] Phone: [phone] Fax: [phone]     Social Determinants of Health (SDOH) Interventions    Readmission Risk Interventions No flowsheet data found.

## 2019-12-08 NOTE — ED Provider Notes (Signed)
[facility]-EMERGENCY DEPT Provider Note   CSN: [id number] Arrival date & time: 12/08/19  1353     History Chief Complaint  Patient presents with  . Fall    Melinda [name] is a 69 y.o. female with a history of lumbar compression fracture status post kyphoplasty last month, fibromyalgia, frequent falls, chronic back pain, present emergency department another fall.  Patient reports that she was at home waiting for the bus and she tried to turn around with her walker and got tripped up and fell on the sidewalk.  She said she fell on her left side.  She reports pain in her left shoulder, her left hip, the left side of her head.  There is no loss of consciousness.  No blood thinner use.  No specific neck pain or radiculopathy.  She is describing significant pain all over her body.  She cannot localize the spot of the pain for me.  Fortunately the patient has an extensive documented history of recurrent falls.  Her primary care provider per 5/20 note expressed concerns for continued use of oxycodone 15 mg IR tablets given her recurring falls and tried to refer her to a pain clinic.  Patient has not followed up here.  She has kyphoplasty on 11/04/19 and was instructed to wear a TLSO brace, but has not been wearing this.  HPI     Past Medical History:  Diagnosis Date  . Acute kidney injury (HCC)   . Allergy   . Anxiety   . Arthritis    "99% of my body" (11/11/2016)  . Asthma   . Bursitis of both hips   . Cataract   . Cervical scoliosis   . Chronic back pain    "all over my back" (11/11/2016)  . Depressive disorder, not elsewhere classified   . Enthesopathy of hip region   . Environmental allergies    "I take Claritin qd; 365 days/year" (11/11/2016)  . Essential hypertension, benign   . Fibromyalgia   . Frequent falls   . GERD (gastroesophageal reflux disease)   . Headache    "at least 1/wk; may last for 2 days or so" (11/11/2016)  . History of blood transfusion  1985   w/hysterectomy  . Lumbar stenosis   . Migraine    "a few/month" (11/11/2016)  . Muscle weakness (generalized)   . Myalgia and myositis, unspecified   . Osteoporosis   . Other abnormal blood chemistry   . Other malaise and fatigue   . Raynaud's syndrome   . Sciatic nerve pain, left   . Sciatica   . Unspecified vitamin D deficiency     Patient Active Problem List   Diagnosis Date Noted  . Chronic back pain   . Closed compression fracture of first lumbar vertebra (HCC) 11/02/2019  . Anxiety and depression 11/02/2019  . Hypokalemia 11/02/2019  . Dry eyes, bilateral 10/05/2019  . Mixed hyperlipidemia 10/05/2019  . Lower extremity weakness 09/18/2019  . Migraine headache 09/18/2019  . Acute kidney injury (HCC) 05/10/2019  . Near syncope 05/09/2019  . Fall at home, initial encounter 05/09/2019  . History of 2019 novel coronavirus disease (COVID-19) 05/09/2019  . Anemia of chronic disease 05/09/2019  . Acute on chronic renal insufficiency 05/09/2019  . COVID-19 virus infection 10/12/2018  . CAP (community acquired pneumonia) 10/06/2018  . Polyneuropathy in diseases classified elsewhere (HCC) 10/04/2018  . Prediabetes 09/12/2017  . High risk medication use 09/12/2017  . Frequent falls 09/12/2017  . Chronic pain syndrome 08/14/2017  .  Gastroesophageal reflux disease 08/14/2017  . Cough variant asthma 03/14/2017  . Pulmonary hypertension ([place]) 03/12/2017  . Right-sided chest pain 11/11/2016  . Asthma 11/11/2016  . Atypical chest pain   . Costochondritis   . Chronic congestive heart failure ([place])   . MDD (major depressive disorder), recurrent episode ([place]) 05/23/2015  . Allergic rhinitis 11/03/2014  . Paresthesia 09/08/2014  . Peripheral neuropathy 07/14/2014  . Morbid obesity due to excess calories ([place]) 07/14/2014  . Pain in limb 07/13/2014  . Abnormality of gait 07/13/2014  . Fibromyalgia 06/13/2014  . Spinal stenosis 01/27/2013  . HTN (hypertension) 11/30/2012     Past Surgical History:  Procedure Laterality Date  . ABDOMINAL HYSTERECTOMY  1985  . [place]; 1979  . IR KYPHO LUMBAR INC FX REDUCE BONE BX UNI/BIL CANNULATION INC/IMAGING  11/04/2019  . SHOULDER OPEN ROTATOR CUFF REPAIR Left      OB History   No obstetric history on file.     Family History  Problem Relation Age of Onset  . Dementia Mother   . Heart disease Mother   . Hypertension Mother   . Diabetes Mother   . Diabetes Sister   . Cancer Sister        lung  . Cancer Other        Nepher (Mothers side)  . Diabetes Maternal Grandmother   . Arthritis Maternal Grandmother   . Diabetes Cousin        Mother's side  . Arthritis Sister   . Arthritis Cousin        Mother's side   . Colon cancer Neg Hx   . Esophageal cancer Neg Hx   . Liver cancer Neg Hx   . Pancreatic cancer Neg Hx   . Rectal cancer Neg Hx   . Stomach cancer Neg Hx     Social History   Tobacco Use  . Smoking status: Never Smoker  . Smokeless tobacco: Never Used  Vaping Use  . Vaping Use: Never used  Substance Use Topics  . Alcohol use: No    Alcohol/week: 0.0 standard drinks  . Drug use: No    Home Medications Prior to Admission medications   Medication Sig Start Date End Date Taking? Authorizing Provider  acetaminophen (TYLENOL) 500 MG tablet Take 1,000 mg by mouth every 8 (eight) hours. For Pain    [provider]  amitriptyline (ELAVIL) 150 MG tablet Take 1 tablet (150 mg total) by mouth at bedtime. 09/26/19   Hennie Duos, MD  ascorbic acid (VITAMIN C) 500 MG tablet Take 1 tablet (500 mg total) by mouth 2 (two) times daily. 09/26/19   Hennie Duos, MD  aspirin EC 81 MG tablet Take 81 mg by mouth daily.    [provider]  carboxymethylcellulose (REFRESH PLUS) 0.5 % SOLN Place 2 drops into both eyes 2 (two) times daily as needed (dry eyes).     [provider]  citalopram (CELEXA) 40 MG tablet Take 0.5 tablets (20 mg total) by mouth daily.  12/06/19   [name], [name] C, NP  [facility] D3 50 MCG (2000 UT) CAPS TAKE 1 CAPSULE BY MOUTH EVERY DAY 11/08/19   [name], [name] C, NP  fluticasone (FLONASE) 50 MCG/ACT nasal spray PLACE 2 SPRAYS INTO BOTH NOSTRILS DAILY AS NEEDED FOR ALLERGIES OR RHINITIS. 11/08/19   [name], [name] C, NP  furosemide (LASIX) 40 MG tablet Take 1 tablet (40 mg total) by mouth daily. 09/26/19   Hennie Duos, MD  loratadine (CLARITIN) 10 MG tablet TAKE 1 TABLET BY MOUTH EVERY DAY 11/08/19   [name], [name] C, NP  metoprolol tartrate (LOPRESSOR) 50 MG tablet TAKE ONE TABLET BY MOUTH TWICE DAILY FOR BLOOD PRESSURE 10/10/19   [name], [name] C, NP  oxyCODONE (ROXICODONE) 15 MG immediate release tablet Take 1 tablet (15 mg total) by mouth every 8 (eight) hours as needed for pain. 12/07/19   [name], [name] C, NP  SUMAtriptan (IMITREX) 25 MG tablet Take one tablet by mouth every 2 hours as needed for migraine or headache. May repeat in 2 hours if persists/recurs. 11/23/19   [name], [name] C, NP  vitamin B-12 (CYANOCOBALAMIN) 500 MCG tablet Take 1 tablet (500 mcg total) by mouth daily. 09/26/19   Margit Hanks, MD    Allergies    Patient has no known allergies.  Review of Systems   Review of Systems  Constitutional: Negative for chills and fever.  Eyes: Negative for pain and visual disturbance.  Respiratory: Negative for cough and shortness of breath.   Cardiovascular: Negative for chest pain and palpitations.  Gastrointestinal: Negative for abdominal pain and vomiting.  Genitourinary: Negative for dysuria and hematuria.  Musculoskeletal: Positive for arthralgias, back pain, gait problem, joint swelling, myalgias and neck pain.  Skin: Negative for color change and rash.  Neurological: Positive for light-headedness and headaches. Negative for syncope.  Psychiatric/Behavioral: Negative for agitation and confusion.  All other systems reviewed and are negative.   Physical Exam Updated Vital Signs BP 134/86 (BP Location: Left  Arm)   Pulse 69   Temp 97.7 F (36.5 C) (Oral)   Resp 18   SpO2 95%   Physical Exam Vitals and nursing note reviewed.  Constitutional:      General: She is not in acute distress.    Appearance: She is well-developed.  HENT:     Head: Normocephalic.     Comments: Small forehead hematoma Eyes:     Conjunctiva/sclera: Conjunctivae normal.  Cardiovascular:     Rate and Rhythm: Normal rate and regular rhythm.     Pulses: Normal pulses.  Pulmonary:     Effort: Pulmonary effort is normal. No respiratory distress.     Breath sounds: Normal breath sounds.  Abdominal:     Palpations: Abdomen is soft.     Tenderness: There is no abdominal tenderness.  Musculoskeletal:     Cervical back: Neck supple.     Comments: Diffuse tenderness of the left axilla with any arm movement Pain in left hip with hip extension No right hip pain on active or passive movement No patellar or ankle pain No evidence of injury to remaining extremities  Skin:    General: Skin is warm and dry.  Neurological:     General: No focal deficit present.     Mental Status: She is alert and oriented to person, place, and time.     ED Results / Procedures / Treatments   Labs (all labs ordered are listed, but only abnormal results are displayed) Labs Reviewed - No data to display  EKG None  Radiology CT Head Wo Contrast  Result Date: 12/08/2019 CLINICAL DATA:  Larey Seat and hit head today. EXAM: CT HEAD WITHOUT CONTRAST TECHNIQUE: Contiguous axial images were obtained from the base of the skull through the vertex without intravenous contrast. COMPARISON:  Head CT 11/01/2019 FINDINGS: Brain: No evidence of acute infarction, hemorrhage, hydrocephalus, extra-axial collection or mass lesion/mass effect. Empty sella noted. Vascular: Mild vascular calcifications but no aneurysm or hyperdense vessels. Skull:  No acute skull fracture or bone lesion. Sinuses/Orbits: The paranasal sinuses and mastoid air cells are clear. The  temporal bones are diffusely aerated. The globes are intact. Other: Small left periorbital scalp hematoma noted. IMPRESSION: 1. No acute intracranial findings or skull fracture. 2. Small left periorbital scalp hematoma. Electronically Signed   By: Rudie Meyer M.D.   On: 12/08/2019 15:57   CT Lumbar Spine Wo Contrast  Result Date: 12/08/2019 CLINICAL DATA:  Low back pain. History of recent kyphoplasty now with recurrent back pain. Patient fell recently. EXAM: CT LUMBAR SPINE WITHOUT CONTRAST TECHNIQUE: Multidetector CT imaging of the lumbar spine was performed without intravenous contrast administration. Multiplanar CT image reconstructions were also generated. COMPARISON:  MRI lumbar spine 11/01/2019 FINDINGS: Segmentation: There are five lumbar type vertebral bodies. The last full intervertebral disc space is labeled L5-S1. This correlates with the prior MRI examination. Alignment: Normal Vertebrae: Vertebral augmentation changes are noted at L1. There is some bone cement noted in the epidural space but the spinal canal is fairly generous and I do not see any significant spinal stenosis. No acute lumbar fractures identified. Stable moderate facet disease. Paraspinal and other soft tissues: No significant paraspinal or retroperitoneal findings. Scattered aortic calcifications. Disc levels: T12-L1: No significant findings. L1-2: No significant findings. L2-3: No significant findings. L3-4: Mild diffuse annular bulge but no significant spinal or foraminal stenosis. Mild facet disease. L4-5: Bulging annulus and moderate facet disease with mild spinal and bilateral lateral recess stenosis. No significant foraminal stenosis. L5-S1: No significant findings. IMPRESSION: 1. Vertebral augmentation changes at L1. There is some bone cement noted in the epidural space but the spinal canal is fairly generous and I do not see any significant spinal stenosis. 2. No acute bony findings. 3. Mild spinal and bilateral lateral  recess stenosis at L4-5. 4. Aortic atherosclerosis. Aortic Atherosclerosis (ICD10-I70.0). Electronically Signed   By: Rudie Meyer M.D.   On: 12/08/2019 15:54   DG Shoulder Left  Result Date: 12/08/2019 CLINICAL DATA:  Left shoulder pain after fall EXAM: LEFT SHOULDER - 2+ VIEW COMPARISON:  08/14/2016 FINDINGS: There is no evidence of fracture or dislocation. Moderate arthropathy of the [facility] joint. Glenohumeral joint appears within normal limits. Soft tissues are unremarkable. IMPRESSION: Moderate arthropathy of the left AC joint. No acute fracture or dislocation. Electronically Signed   By: Duanne Guess D.O.   On: 12/08/2019 15:57   DG Hip Unilat W or Wo Pelvis 2-3 Views Left  Result Date: 12/08/2019 CLINICAL DATA:  Status post fall. EXAM: DG HIP (WITH OR WITHOUT PELVIS) 2-3V LEFT COMPARISON:  April 06, 2018 FINDINGS: There is no evidence of hip fracture or dislocation. There is no evidence of arthropathy or other focal bone abnormality. IMPRESSION: Negative. Electronically Signed   By: Aram Candela M.D.   On: 12/08/2019 15:58    Procedures Procedures (including critical care time)  Medications Ordered in ED Medications  acetaminophen (TYLENOL) tablet 650 mg (650 mg Oral Given 12/08/19 1600)    ED Course  I have reviewed the triage vital signs and the nursing notes.  Pertinent labs & imaging results that were available during my care of the patient were reviewed by me and considered in my medical decision making (see chart for details).  69 yo female presenting with mechanical fall from standing earlier today, landed on her left side, struck head on ground, no LOC.  She reports diffuse pain in her entire left body and lower back.   It is a difficult exam and  she reports severe pain in multiple locations.  Most focally this appears to be her shoulder and her left hip, which we will xray.  She also has back pain with a recent spinal surgery 1 month ago.  She has not been wearing her  back brace.  We'll repeat CT scan her lower back.  No evidence of cord compression today.  We'll also CT her head with her head trauma.  Otherwise she looks stable on exam.  I echo her PCP's concerns that her frequent falls may be related to her opioid usage at home.  I recognize the many reasons for her chronic pain, but I emphasized she needs to see a pain management doctor to get off these medications.  Next time she may break her hip or suffer a more serious injury.  We'll give tylenol for pain in the ED.  I will not give narcotics unless I find an acute clinical cause here.  Update - imaging reviewed, no acute fractures noted.  Patient asking for food.  SW consulted regarding home health needs, another walker for discharge.  She will likely need PTAR home.  Clinical Course as of Dec 07 1821  Thu Dec 08, 2019  1627  IMPRESSION: 1. Vertebral augmentation changes at L1. There is some bone cement noted in the epidural space but the spinal canal is fairly generous and I do not see any significant spinal stenosis. 2. No acute bony findings. 3. Mild spinal and bilateral lateral recess stenosis at L4-5. 4. Aortic atherosclerosis.   [MT]    Clinical Course User Index [MT] Chayanne Speir, Kermit Balo, MD    Final Clinical Impression(s) / ED Diagnoses Final diagnoses:  Hematoma of scalp, initial encounter  Fall, initial encounter    Rx / DC Orders ED Discharge Orders    None       Demetreus Lothamer, Kermit Balo, MD 12/08/19 [phone]

## 2019-12-08 NOTE — ED Notes (Signed)
Pt transported to CT at this time.

## 2019-12-08 NOTE — ED Triage Notes (Signed)
EMS reports Pt walking near home trip and fall, uses walker and got tripped up in a crack fell to left side. C/o left side head, shoulder and hip pain, hematoma noted on left face. No LOC, no obvious injury, no blood thinners.  BP 140/74 HR 84 RR 16 Sp02 95 RA

## 2019-12-08 NOTE — ED Notes (Signed)
Patient told this nurse she gave her keys to the apartment manager prior to coming to the ED. This nurse is unable to contact her daughter who she states has an extra key. This nurse has also tried to call the apartment complex to gain access. Patient also did not bring her phone and is unable to contact anyone else. Patient provided with a sandwich and drink per request. She states she has not been undated so this nurse went over her discharge instructions with her again. Will continue to attempt to contact someone from the apartment complex.

## 2019-12-08 NOTE — Discharge Instructions (Addendum)
You were seen in the emergency department today after having another fall.  We got x-rays of your shoulder and your left hip, which did not show a fracture.  We also did a CT scan of your lower back, which thankfully did not show any new fractures or injuries since your spine surgery last month.  Your CT scan of the brain also did not show any brain bleed.  It is important that you wear your spine/back brace at home, as instructed by your surgeon!  This will help with your pain and give your back stability.  It is important that you use your walker at all times at home.  Finally, your primary care provider has written about concerns that you are falling, because of the pain pills you are on at home.  We have seen you become sleepy and dizzy in the ER in the past after giving you pain pills.  This is the reason I did not want to give you narcotics in the ER today.  I would strongly recommend cutting down on your opioids at home (eg oxycodone). This may help reduce the number of falls you are having.  Try to use tylenol whenever possible instead.

## 2019-12-08 NOTE — ED Notes (Signed)
PTAR notified need for transport  

## 2019-12-09 ENCOUNTER — Other Ambulatory Visit: Payer: Self-pay

## 2019-12-09 NOTE — Patient Outreach (Signed)
[facility]) Care Management  12/09/2019  Melinda [name] 01-Mar-1951 [id number]   Referral Date: 12/08/19 Referral Source: Hospital Social Work  Referral Reason: 3 ED visits in 6 months   Outreach Attempt: No answer. HIPAA compliant voice message left.     Plan: RN CM will attempt patient again within 4 business days and send letter.    [name], RN, MSN [facility] Care Management Care Management Coordinator Direct Line [phone] Toll Free: [phone]  Fax: [phone]

## 2019-12-12 ENCOUNTER — Encounter: Payer: Self-pay | Admitting: Family

## 2019-12-12 ENCOUNTER — Other Ambulatory Visit: Payer: Self-pay

## 2019-12-12 NOTE — Patient Outreach (Signed)
[facility]) Care Management  12/12/2019  Leightyn Cina 24-Feb-1951 [id number]   Referral Date: 12/08/19 Referral Source: Hospital Social Work       Referral Reason: 3 ED visits in 6 months   Outreach Attempt: No answer. HIPAA compliant voice message left.     Plan: RN CM will attempt patient again within 4 business days.  [name], RN, MSN [facility] Care Management Care Management Coordinator Direct Line [phone] Cell [phone] Toll Free: [phone]  Fax: [phone]

## 2019-12-13 ENCOUNTER — Telehealth: Payer: Self-pay

## 2019-12-13 ENCOUNTER — Other Ambulatory Visit: Payer: Self-pay

## 2019-12-13 ENCOUNTER — Other Ambulatory Visit: Payer: Self-pay | Admitting: *Deleted

## 2019-12-13 DIAGNOSIS — G43009 Migraine without aura, not intractable, without status migrainosus: Secondary | ICD-10-CM

## 2019-12-13 MED ORDER — SUMATRIPTAN SUCCINATE 25 MG PO TABS: ORAL_TABLET | ORAL | 0 refills | Status: DC

## 2019-12-13 NOTE — Patient Outreach (Signed)
[facility]) Care Management  12/13/2019  Mekesha Solomon 07/10/1950 [id number]   Referral Date:12/08/19 Referral Source:Hospital Social Work Referral Reason:3 ED visits in 6 months   Outreach Attempt:No answer. HIPAA compliant voice message left.   Plan:RN CM will wait return call. If no return call will try in again within the next 3 weeks.    [name], RN, MSN [facility] Care Management Care Management Coordinator Direct Line [phone] Cell [phone] Toll Free: [phone]  Fax: [phone]

## 2019-12-13 NOTE — Telephone Encounter (Signed)
Patient called requesting to speak with [place] Engineer, maintenance (IT)) or [name]'s supervising physician, Dr.Reed.  Patient was informed that Pamelia Hoit will return to office on Monday 12/19/2019 and Dr.Reed is not in office today (working at a remote site)  Patient states she is very dissatisfied with her care here at [facility].  Patient states Carilyn Goodpasture took it upon herself to decrease oxycodone without consulting with her. Patient states she was originally getting 180 tablets, then it was decreased to 120, and now she is only getting 90 tablets. Patient expressed that with the amount of pain she is experiencing in various locations "which should be on my record" 90 pills is not enough.    Patient continued to say that medications should not be changed without informing the patient first and she was done wrong. Patient is requesting Dr.Reed or [place] call her.

## 2019-12-13 NOTE — Telephone Encounter (Signed)
Attempted to return patient's call at [phone].  Voicemail reached and it was full.

## 2019-12-13 NOTE — Telephone Encounter (Signed)
Patient called and stated that she needs a refill on her migraine medication and would like for it to be sent to [facility] for a 90 day supply.   Pended Rx and sent to [facility] for approval.

## 2019-12-15 ENCOUNTER — Ambulatory Visit: Payer: Medicare HMO

## 2019-12-15 NOTE — Telephone Encounter (Signed)
Copy of encounter printed and placed in Ronda's Engineer, maintenance (IT)) mailbox for review when she returns

## 2019-12-26 ENCOUNTER — Telehealth: Payer: Self-pay

## 2019-12-26 ENCOUNTER — Other Ambulatory Visit: Payer: Self-pay | Admitting: Family

## 2019-12-26 DIAGNOSIS — G43009 Migraine without aura, not intractable, without status migrainosus: Secondary | ICD-10-CM

## 2019-12-26 NOTE — Telephone Encounter (Addendum)
Kristopher Oppenheim with [facility] [phone]) called about patient. They have orders to start home care, but they have been unable to contact the patient. They have tried calling her son and daughter and get no response from them. They have gone by the patient's house and she does not come to the door, nor respond to messages left for her. They are requesting a new start of care date, and are asking for a range of 6/29-12/30/19, due to their difficulty in trying to establish contact with patient.   Told Clydie Braun that it was ok to extend the start date to the requested range.

## 2019-12-29 ENCOUNTER — Other Ambulatory Visit: Payer: Self-pay

## 2019-12-29 NOTE — Patient Outreach (Signed)
[facility]) Care Management  12/29/2019  Paislynn Hegstrom 06-09-1951 [id number]   Referral Date:12/08/19 Referral Source:Hospital Social Work Referral Reason:3 ED visits in 6 months   Outreach Attempt:No answer. States VM full unable to leave a message.   Plan:RN CM will close case.    [name], RN, MSN [facility] Care Management Care Management Coordinator Direct Line [phone] Cell [phone] Toll Free: [phone]  Fax: [phone]

## 2020-01-06 ENCOUNTER — Other Ambulatory Visit: Payer: Self-pay | Admitting: *Deleted

## 2020-01-06 MED ORDER — OXYCODONE HCL 15 MG PO TABS: 15.0000 mg | ORAL_TABLET | Freq: Three times a day (TID) | ORAL | 0 refills | Status: DC | PRN

## 2020-01-06 NOTE — Telephone Encounter (Signed)
Patient requested refill Epic LR: 12/07/19 Pended Rx and sent to [facility] for approval.

## 2020-01-08 DIAGNOSIS — S299XXA Unspecified injury of thorax, initial encounter: Secondary | ICD-10-CM | POA: Diagnosis not present

## 2020-01-08 DIAGNOSIS — M25532 Pain in left wrist: Secondary | ICD-10-CM | POA: Diagnosis not present

## 2020-01-08 DIAGNOSIS — R937 Abnormal findings on diagnostic imaging of other parts of musculoskeletal system: Secondary | ICD-10-CM | POA: Diagnosis not present

## 2020-01-08 DIAGNOSIS — S2249XA Multiple fractures of ribs, unspecified side, initial encounter for closed fracture: Secondary | ICD-10-CM | POA: Diagnosis not present

## 2020-01-08 DIAGNOSIS — S199XXA Unspecified injury of neck, initial encounter: Secondary | ICD-10-CM | POA: Diagnosis not present

## 2020-01-08 DIAGNOSIS — Y92002 Bathroom of unspecified non-institutional (private) residence single-family (private) house as the place of occurrence of the external cause: Secondary | ICD-10-CM | POA: Diagnosis not present

## 2020-01-08 DIAGNOSIS — Z743 Need for continuous supervision: Secondary | ICD-10-CM | POA: Diagnosis not present

## 2020-01-08 DIAGNOSIS — Y9389 Activity, other specified: Secondary | ICD-10-CM | POA: Diagnosis not present

## 2020-01-08 DIAGNOSIS — R519 Headache, unspecified: Secondary | ICD-10-CM | POA: Diagnosis not present

## 2020-01-08 DIAGNOSIS — J9811 Atelectasis: Secondary | ICD-10-CM | POA: Diagnosis not present

## 2020-01-08 DIAGNOSIS — S0990XA Unspecified injury of head, initial encounter: Secondary | ICD-10-CM | POA: Diagnosis not present

## 2020-01-08 DIAGNOSIS — W01198A Fall on same level from slipping, tripping and stumbling with subsequent striking against other object, initial encounter: Secondary | ICD-10-CM | POA: Diagnosis not present

## 2020-01-08 DIAGNOSIS — I700 Atherosclerosis of aorta: Secondary | ICD-10-CM | POA: Diagnosis not present

## 2020-01-09 ENCOUNTER — Telehealth: Payer: Self-pay

## 2020-01-09 NOTE — Telephone Encounter (Signed)
Called patient today to follow up per provider  [name], [name], NP message below.   Good morning Melinda [name],  Please call Ms.Melinda [name] and notify to call [facility].Practice has tried to contact her multiple times without any success.Ask her to call practice if she still desire to get a colonoscopy.Here is the address and telephone for Practice.  [facility]  [address] [place], [place] [address]  Phone: [phone]  Fax: [phone]   Thank You  [name] [name] FNP-C    Patient didn't answer. Voicemail was left with office call back number. There will be 2 more attempts to contact patient.

## 2020-01-27 NOTE — Telephone Encounter (Signed)
Patient called twice today at 8:40am and 8:30am. Unable to leave voicemail on patient phone. Patient was left a voicemail 01/09/2020 and hasn't contacted the office back. Patient has had 3 unsuccessful contact  attempts.

## 2020-02-01 ENCOUNTER — Other Ambulatory Visit: Payer: Self-pay | Admitting: Family

## 2020-02-01 DIAGNOSIS — I10 Essential (primary) hypertension: Secondary | ICD-10-CM

## 2020-02-08 ENCOUNTER — Other Ambulatory Visit: Payer: Self-pay

## 2020-02-08 ENCOUNTER — Other Ambulatory Visit: Payer: Self-pay | Admitting: *Deleted

## 2020-02-08 MED ORDER — OXYCODONE HCL 15 MG PO TABS: 15.0000 mg | ORAL_TABLET | Freq: Three times a day (TID) | ORAL | 0 refills | Status: DC | PRN

## 2020-02-08 NOTE — Telephone Encounter (Signed)
Rx was sent to [facility] this morning.  Pharmacy updated and Rx repended for approval.

## 2020-02-08 NOTE — Telephone Encounter (Signed)
Patient called wanting refill on Oxycodone. Patient has opioid agreement. Pended and sent to dinah for approval.

## 2020-02-13 ENCOUNTER — Telehealth: Payer: Self-pay | Admitting: *Deleted

## 2020-02-13 NOTE — Telephone Encounter (Signed)
Patient called very upset and wanting to speak with office manager.  Stated that she has been dealing with her [employer] Application since April and we still haven't filled out and sent in.  Patient stated that the [employer] office has faxed our office the application and we have not completed it and faxed back.   I called [employer] [phone] and spoke with Victorino Dike and she stated that they DO NOT fax SCAT Applications because there is a portion that the patient has to fill out. They have never faxed applications to the Dr. Isidore Moos. She stated that they mail the application to the patient and then the patient completes their portion and sends it to the Dr. Isidore Moos to complete.   She gave me the web address to print off the application. Printed and will fill out our portion and have provider sign and then will call patient to pick up their portion to complete.   Tried calling patient Picks up then hangs up.

## 2020-02-13 NOTE — Telephone Encounter (Signed)
Patient called back and stated that she will come tomorrow to complete her portion of the form.  (form at Clinical Intake Desk and she is to complete Part A)

## 2020-02-20 ENCOUNTER — Other Ambulatory Visit: Payer: Self-pay | Admitting: Family

## 2020-02-21 NOTE — Telephone Encounter (Signed)
Patient Came by the office today and filled out her portion of the SCAT forms. (Part A)  Placed the SCAT Forms in Melinda [name]'s folder to review, fill out and sign.   To be Mailed to [facility] [employer]. [address] [place] [address]

## 2020-02-23 ENCOUNTER — Other Ambulatory Visit: Payer: Self-pay | Admitting: *Deleted

## 2020-02-23 DIAGNOSIS — I10 Essential (primary) hypertension: Secondary | ICD-10-CM

## 2020-02-23 DIAGNOSIS — J302 Other seasonal allergic rhinitis: Secondary | ICD-10-CM

## 2020-02-23 DIAGNOSIS — F332 Major depressive disorder, recurrent severe without psychotic features: Secondary | ICD-10-CM

## 2020-02-23 MED ORDER — CITALOPRAM HYDROBROMIDE 40 MG PO TABS: 20.0000 mg | ORAL_TABLET | Freq: Every day | ORAL | 1 refills | Status: DC

## 2020-02-23 MED ORDER — FLUTICASONE PROPIONATE 50 MCG/ACT NA SUSP: 2.0000 | Freq: Every day | NASAL | 1 refills | Status: DC | PRN

## 2020-02-23 MED ORDER — METOPROLOL TARTRATE 50 MG PO TABS: ORAL_TABLET | ORAL | 1 refills | Status: DC

## 2020-02-23 MED ORDER — AMITRIPTYLINE HCL 150 MG PO TABS: 150.0000 mg | ORAL_TABLET | Freq: Every day | ORAL | 1 refills | Status: DC

## 2020-02-23 MED ORDER — FUROSEMIDE 40 MG PO TABS: 40.0000 mg | ORAL_TABLET | Freq: Every day | ORAL | 1 refills | Status: DC

## 2020-02-23 NOTE — Telephone Encounter (Signed)
[facility] called and stated that patient is requesting all of her Rx's to come from [facility]. [employer] has requested rx's from [facility] but have not received them and patient is requesting medications.  Wants Rx's faxed .   Pended Rx's and sent to [facility] for approval due to HIGH ALERT Warning.

## 2020-02-24 NOTE — Telephone Encounter (Addendum)
SCAT Forms completed and Mailed to [facility] [employer].   Copy sent for scanning.

## 2020-02-27 ENCOUNTER — Emergency Department (HOSPITAL_COMMUNITY): Payer: Medicare HMO

## 2020-02-27 ENCOUNTER — Encounter (HOSPITAL_COMMUNITY): Payer: Self-pay

## 2020-02-27 ENCOUNTER — Other Ambulatory Visit: Payer: Self-pay

## 2020-02-27 ENCOUNTER — Emergency Department (HOSPITAL_COMMUNITY)
Admission: EM | Admit: 2020-02-27 | Discharge: 2020-02-27 | Disposition: A | Discharge status: Home / Self Care | Payer: Medicare HMO | Attending: Emergency Medicine | Admitting: Emergency Medicine

## 2020-02-27 DIAGNOSIS — Y939 Activity, unspecified: Secondary | ICD-10-CM | POA: Diagnosis not present

## 2020-02-27 DIAGNOSIS — W01198A Fall on same level from slipping, tripping and stumbling with subsequent striking against other object, initial encounter: Secondary | ICD-10-CM | POA: Insufficient documentation

## 2020-02-27 DIAGNOSIS — I10 Essential (primary) hypertension: Secondary | ICD-10-CM | POA: Diagnosis not present

## 2020-02-27 DIAGNOSIS — Z79899 Other long term (current) drug therapy: Secondary | ICD-10-CM | POA: Diagnosis not present

## 2020-02-27 DIAGNOSIS — M47812 Spondylosis without myelopathy or radiculopathy, cervical region: Secondary | ICD-10-CM | POA: Diagnosis not present

## 2020-02-27 DIAGNOSIS — S0990XA Unspecified injury of head, initial encounter: Secondary | ICD-10-CM | POA: Insufficient documentation

## 2020-02-27 DIAGNOSIS — Y929 Unspecified place or not applicable: Secondary | ICD-10-CM | POA: Diagnosis not present

## 2020-02-27 DIAGNOSIS — R0781 Pleurodynia: Secondary | ICD-10-CM | POA: Diagnosis not present

## 2020-02-27 DIAGNOSIS — Y999 Unspecified external cause status: Secondary | ICD-10-CM | POA: Insufficient documentation

## 2020-02-27 DIAGNOSIS — Z7982 Long term (current) use of aspirin: Secondary | ICD-10-CM | POA: Insufficient documentation

## 2020-02-27 DIAGNOSIS — G319 Degenerative disease of nervous system, unspecified: Secondary | ICD-10-CM | POA: Diagnosis not present

## 2020-02-27 DIAGNOSIS — S199XXA Unspecified injury of neck, initial encounter: Secondary | ICD-10-CM | POA: Diagnosis not present

## 2020-02-27 DIAGNOSIS — R52 Pain, unspecified: Secondary | ICD-10-CM | POA: Diagnosis not present

## 2020-02-27 DIAGNOSIS — M25511 Pain in right shoulder: Secondary | ICD-10-CM | POA: Diagnosis not present

## 2020-02-27 DIAGNOSIS — W19XXXA Unspecified fall, initial encounter: Secondary | ICD-10-CM | POA: Diagnosis not present

## 2020-02-27 DIAGNOSIS — Z043 Encounter for examination and observation following other accident: Secondary | ICD-10-CM | POA: Diagnosis not present

## 2020-02-27 DIAGNOSIS — M549 Dorsalgia, unspecified: Secondary | ICD-10-CM | POA: Diagnosis not present

## 2020-02-27 DIAGNOSIS — J45909 Unspecified asthma, uncomplicated: Secondary | ICD-10-CM | POA: Insufficient documentation

## 2020-02-27 DIAGNOSIS — M25551 Pain in right hip: Secondary | ICD-10-CM | POA: Diagnosis not present

## 2020-02-27 DIAGNOSIS — M79601 Pain in right arm: Secondary | ICD-10-CM | POA: Insufficient documentation

## 2020-02-27 DIAGNOSIS — M79604 Pain in right leg: Secondary | ICD-10-CM | POA: Diagnosis not present

## 2020-02-27 NOTE — ED Notes (Signed)
Called Taxi services for price quotes to get patient transported back to her residence as has no one else to come pick her up.

## 2020-02-27 NOTE — ED Notes (Signed)
Patient transported to X-ray 

## 2020-02-27 NOTE — Discharge Instructions (Signed)
Your CT scans and xrays were negative for any fractures.  Please follow up with your PCP regarding your ED visit today as well as your recurrent falls. I have placed a referral for physical therapy to help you gain strength in your legs at home.  Continue taking your medications as prescribed.  Return to the ED for any worsening symptoms

## 2020-02-27 NOTE — ED Provider Notes (Signed)
[facility]-EMERGENCY DEPT Provider Note   CSN: [id number] Arrival date & time: 02/27/20  1152     History Chief Complaint  Patient presents with  . Fall  . Back Pain  . Leg Pain    Melinda [name] is a 69 y.o. female with PMHx HTN, fibromyalgia, chronic back pain, osteoporosis who presents to the ED today via EMS for fall. Pt has hx of frequent falls; she states she has both a rolling walker as well as a stand walker however she has trouble with both of them. She reports she got up today when her legs were so weak they gave out on her causing her to fall on the right side of her body. Pt states she hit the back of her head; no LOC. She is not anticoagulated. She reports she could not get up prompting her to call EMS. She states she was on the ground for about 30 minutes before they were able to help her up. She is currently complaining of pain "everywhere" but most specifically in her head, neck, R shoulder, R ribs, and R hip. Pt states she has fibromyalgia and chronic pain and whenever she falls it will set off all of her other pain. Pt is requesting help with getting an electric wheelchair - she has not discussed this with her PCP. Pt denies vision changes, nausea, vomiting, unilateral weakness, slurred speech, or any other new/concerning symptoms.    The history is provided by the patient and medical records.       Past Medical History:  Diagnosis Date  . Acute kidney injury (HCC)   . Allergy   . Anxiety   . Arthritis    "99% of my body" (11/11/2016)  . Asthma   . Bursitis of both hips   . Cataract   . Cervical scoliosis   . Chronic back pain    "all over my back" (11/11/2016)  . Depressive disorder, not elsewhere classified   . Enthesopathy of hip region   . Environmental allergies    "I take Claritin qd; 365 days/year" (11/11/2016)  . Essential hypertension, benign   . Fibromyalgia   . Frequent falls   . GERD (gastroesophageal reflux disease)   .  Headache    "at least 1/wk; may last for 2 days or so" (11/11/2016)  . History of blood transfusion 1985   w/hysterectomy  . Lumbar stenosis   . Migraine    "a few/month" (11/11/2016)  . Muscle weakness (generalized)   . Myalgia and myositis, unspecified   . Osteoporosis   . Other abnormal blood chemistry   . Other malaise and fatigue   . Raynaud's syndrome   . Sciatic nerve pain, left   . Sciatica   . Unspecified vitamin D deficiency     Patient Active Problem List   Diagnosis Date Noted  . Chronic back pain   . Closed compression fracture of first lumbar vertebra (HCC) 11/02/2019  . Anxiety and depression 11/02/2019  . Hypokalemia 11/02/2019  . Dry eyes, bilateral 10/05/2019  . Mixed hyperlipidemia 10/05/2019  . Lower extremity weakness 09/18/2019  . Migraine headache 09/18/2019  . Acute kidney injury (HCC) 05/10/2019  . Near syncope 05/09/2019  . Fall at home, initial encounter 05/09/2019  . History of 2019 novel coronavirus disease (COVID-19) 05/09/2019  . Anemia of chronic disease 05/09/2019  . Acute on chronic renal insufficiency 05/09/2019  . COVID-19 virus infection 10/12/2018  . CAP (community acquired pneumonia) 10/06/2018  . Polyneuropathy in diseases  classified elsewhere (HCC) 10/04/2018  . Prediabetes 09/12/2017  . High risk medication use 09/12/2017  . Frequent falls 09/12/2017  . Chronic pain syndrome 08/14/2017  . Gastroesophageal reflux disease 08/14/2017  . Cough variant asthma 03/14/2017  . Pulmonary hypertension (HCC) 03/12/2017  . Right-sided chest pain 11/11/2016  . Asthma 11/11/2016  . Atypical chest pain   . Costochondritis   . Chronic congestive heart failure (HCC)   . MDD (major depressive disorder), recurrent episode (HCC) 05/23/2015  . Allergic rhinitis 11/03/2014  . Paresthesia 09/08/2014  . Peripheral neuropathy 07/14/2014  . Morbid obesity due to excess calories (HCC) 07/14/2014  . Pain in limb 07/13/2014  . Abnormality of gait  07/13/2014  . Fibromyalgia 06/13/2014  . Spinal stenosis 01/27/2013  . HTN (hypertension) 11/30/2012    Past Surgical History:  Procedure Laterality Date  . ABDOMINAL HYSTERECTOMY  1985  . CESAREAN SECTION  1976; 1979  . IR KYPHO LUMBAR INC FX REDUCE BONE BX UNI/BIL CANNULATION INC/IMAGING  11/04/2019  . SHOULDER OPEN ROTATOR CUFF REPAIR Left      OB History   No obstetric history on file.     Family History  Problem Relation Age of Onset  . Dementia Mother   . Heart disease Mother   . Hypertension Mother   . Diabetes Mother   . Diabetes Sister   . Cancer Sister        lung  . Cancer Other        Nepher (Mothers side)  . Diabetes Maternal Grandmother   . Arthritis Maternal Grandmother   . Diabetes Cousin        Mother's side  . Arthritis Sister   . Arthritis Cousin        Mother's side   . Colon cancer Neg Hx   . Esophageal cancer Neg Hx   . Liver cancer Neg Hx   . Pancreatic cancer Neg Hx   . Rectal cancer Neg Hx   . Stomach cancer Neg Hx     Social History   Tobacco Use  . Smoking status: Never Smoker  . Smokeless tobacco: Never Used  Vaping Use  . Vaping Use: Never used  Substance Use Topics  . Alcohol use: No    Alcohol/week: 0.0 standard drinks  . Drug use: No    Home Medications Prior to Admission medications   Medication Sig Start Date End Date Taking? Authorizing Provider  acetaminophen (TYLENOL) 500 MG tablet Take 1,000 mg by mouth every 8 (eight) hours. For Pain    [provider]  amitriptyline (ELAVIL) 150 MG tablet Take 1 tablet (150 mg total) by mouth at bedtime. 02/23/20   [name], [name] C, NP  ascorbic acid (VITAMIN C) 500 MG tablet Take 1 tablet (500 mg total) by mouth 2 (two) times daily. 09/26/19   Margit Hanks, MD  aspirin EC 81 MG tablet Take 81 mg by mouth daily.    [provider]  carboxymethylcellulose (REFRESH PLUS) 0.5 % SOLN Place 2 drops into both eyes 2 (two) times daily as needed (dry eyes).      [provider]  citalopram (CELEXA) 40 MG tablet Take 0.5 tablets (20 mg total) by mouth daily. 02/23/20   [name], [name] C, NP  [facility] D3 50 MCG (2000 UT) CAPS TAKE 1 CAPSULE BY MOUTH EVERY DAY 11/08/19   [name], [name] C, NP  fluticasone (FLONASE) 50 MCG/ACT nasal spray Place 2 sprays into both nostrils daily as needed for allergies or rhinitis. 02/23/20  [name], [name] C, NP  furosemide (LASIX) 40 MG tablet Take 1 tablet (40 mg total) by mouth daily. 02/23/20   [name], [name] C, NP  loratadine (CLARITIN) 10 MG tablet TAKE 1 TABLET BY MOUTH EVERY DAY 11/08/19   [name], [name] C, NP  metoprolol tartrate (LOPRESSOR) 50 MG tablet TAKE ONE TABLET BY MOUTH TWICE DAILY FOR BLOOD PRESSURE 02/23/20   [name], [name] C, NP  oxyCODONE (ROXICODONE) 15 MG immediate release tablet Take 1 tablet (15 mg total) by mouth every 8 (eight) hours as needed for pain. 02/08/20   [name], [name] C, NP  SUMAtriptan (IMITREX) 25 MG tablet TAKE ONE TABLET BY MOUTH EVERY 2 HOURS AS NEEDED FOR MIGRAINE OR HEADACHE. MAY REPEAT IN 2 HOURS IF PERSISTS/RECURS. 12/26/19   [name], [name] C, NP  vitamin B-12 (CYANOCOBALAMIN) 500 MCG tablet Take 1 tablet (500 mcg total) by mouth daily. 09/26/19   Margit Hanks, MD    Allergies    Patient has no known allergies.  Review of Systems   Review of Systems  Constitutional: Negative for chills and fever.  Eyes: Negative for visual disturbance.  Musculoskeletal: Positive for arthralgias.  Neurological: Positive for headaches. Negative for dizziness, weakness, light-headedness and numbness.  All other systems reviewed and are negative.   Physical Exam Updated Vital Signs BP (!) 160/97 (BP Location: Left Arm)   Pulse 67   Temp 97.7 F (36.5 C) (Oral)   Resp 18   Ht 5\' 4"  (1.626 m)   Wt 102.1 kg   SpO2 97%   BMI 38.62 kg/m   Physical Exam Vitals and nursing note reviewed.  Constitutional:      Appearance: She is not ill-appearing or diaphoretic.  HENT:     Head:  Normocephalic.     Comments: Hematoma to the right occiput; no wound Eyes:     Conjunctiva/sclera: Conjunctivae normal.  Cardiovascular:     Rate and Rhythm: Normal rate and regular rhythm.     Pulses: Normal pulses.  Pulmonary:     Effort: Pulmonary effort is normal.     Breath sounds: Normal breath sounds. No wheezing, rhonchi or rales.     Comments: No ecchymosis appreciated; No crepitus; + TTP to right lateral ribs Chest:     Chest wall: Tenderness present.  Abdominal:     Palpations: Abdomen is soft.     Tenderness: There is no abdominal tenderness. There is no guarding or rebound.  Musculoskeletal:     Cervical back: Neck supple. Tenderness present.     Comments: Diffuse TTP throughout back however pt reports this is chronic.   + TTP to the R shoulder/proximal humerus. No deformity appreciated. ROM intact. Strength 4/5 however equal to left side. Sensation intact throughout. 2+ radial pulse.   No ecchymosis to R hip. No deformity. No leg shortening or external rotation. + TTP.   Skin:    General: Skin is warm and dry.  Neurological:     Mental Status: She is alert.     ED Results / Procedures / Treatments   Labs (all labs ordered are listed, but only abnormal results are displayed) Labs Reviewed - No data to display  EKG None  Radiology DG Ribs Unilateral W/Chest Right  Result Date: 02/27/2020 CLINICAL DATA:  Status post fall with subsequent right rib pain. EXAM: RIGHT RIBS AND CHEST - 3+ VIEW COMPARISON:  Nov 01, 2019 FINDINGS: A radiopaque marker was placed at the site of the patient's pain. No fracture or other bone lesions are  seen involving the ribs. There is no evidence of pneumothorax or pleural effusion. Both lungs are clear. Heart size and mediastinal contours are within normal limits. IMPRESSION: Negative. Electronically Signed   By: Aram Candela M.D.   On: 02/27/2020 15:42   DG Shoulder Right  Result Date: 02/27/2020 CLINICAL DATA:  Right shoulder  pain after fall EXAM: RIGHT SHOULDER - 2+ VIEW COMPARISON:  08/03/2015 FINDINGS: There is no evidence of fracture or dislocation. Mild degenerative changes of the acromioclavicular and glenohumeral joints. Soft tissues are unremarkable. IMPRESSION: Mild degenerative changes of the right shoulder. No acute fracture or dislocation. Electronically Signed   By: Duanne Guess D.O.   On: 02/27/2020 15:36   CT Head Wo Contrast  Result Date: 02/27/2020 CLINICAL DATA:  Head trauma, minor. Neck trauma. Additional history provided: Fall from standing position, back pain, right arm pain, leg weakness. EXAM: CT HEAD WITHOUT CONTRAST CT CERVICAL SPINE WITHOUT CONTRAST TECHNIQUE: Multidetector CT imaging of the head and cervical spine was performed following the standard protocol without intravenous contrast. Multiplanar CT image reconstructions of the cervical spine were also generated. COMPARISON:  Prior head CT examinations 12/08/2019 and earlier. CT of the cervical spine 10/11/2017. FINDINGS: CT HEAD FINDINGS Brain: Stable, mild generalized parenchymal atrophy. There is no acute intracranial hemorrhage. No demarcated cortical infarct. No extra-axial fluid collection. No evidence of intracranial mass. No midline shift. Partially empty sella turcica. Vascular: No hyperdense vessel. Skull: Normal. Negative for fracture or focal suspicious osseous lesion. Sinuses/Orbits: Visualized orbits show no acute finding. No significant paranasal sinus disease or mastoid effusion at the imaged levels. CT CERVICAL SPINE FINDINGS Alignment: Straightening of the expected cervical lordosis. No significant spondylolisthesis. Skull base and vertebrae: The basion-dental and atlanto-dental intervals are maintained.No evidence of acute fracture to the cervical spine. Soft tissues and spinal canal: No prevertebral fluid or swelling. No visible canal hematoma. Disc levels: Mild cervical spondylosis. No high-grade bony spinal canal or neural  foraminal narrowing. Upper chest: No consolidation within the imaged lung apices. No visible pneumothorax. IMPRESSION: CT head: 1. No evidence of acute intracranial abnormality. 2. Stable, mild generalized parenchymal atrophy. CT cervical spine: No evidence of acute fracture to the cervical spine. Electronically Signed   By: Jackey Loge DO   On: 02/27/2020 15:58   CT Cervical Spine Wo Contrast  Result Date: 02/27/2020 CLINICAL DATA:  Head trauma, minor. Neck trauma. Additional history provided: Fall from standing position, back pain, right arm pain, leg weakness. EXAM: CT HEAD WITHOUT CONTRAST CT CERVICAL SPINE WITHOUT CONTRAST TECHNIQUE: Multidetector CT imaging of the head and cervical spine was performed following the standard protocol without intravenous contrast. Multiplanar CT image reconstructions of the cervical spine were also generated. COMPARISON:  Prior head CT examinations 12/08/2019 and earlier. CT of the cervical spine 10/11/2017. FINDINGS: CT HEAD FINDINGS Brain: Stable, mild generalized parenchymal atrophy. There is no acute intracranial hemorrhage. No demarcated cortical infarct. No extra-axial fluid collection. No evidence of intracranial mass. No midline shift. Partially empty sella turcica. Vascular: No hyperdense vessel. Skull: Normal. Negative for fracture or focal suspicious osseous lesion. Sinuses/Orbits: Visualized orbits show no acute finding. No significant paranasal sinus disease or mastoid effusion at the imaged levels. CT CERVICAL SPINE FINDINGS Alignment: Straightening of the expected cervical lordosis. No significant spondylolisthesis. Skull base and vertebrae: The basion-dental and atlanto-dental intervals are maintained.No evidence of acute fracture to the cervical spine. Soft tissues and spinal canal: No prevertebral fluid or swelling. No visible canal hematoma. Disc levels: Mild cervical spondylosis. No  high-grade bony spinal canal or neural foraminal narrowing. Upper chest:  No consolidation within the imaged lung apices. No visible pneumothorax. IMPRESSION: CT head: 1. No evidence of acute intracranial abnormality. 2. Stable, mild generalized parenchymal atrophy. CT cervical spine: No evidence of acute fracture to the cervical spine. Electronically Signed   By: Jackey Loge DO   On: 02/27/2020 15:58   DG Humerus Right  Result Date: 02/27/2020 CLINICAL DATA:  Status post fall. EXAM: RIGHT HUMERUS - 2+ VIEW COMPARISON:  None. FINDINGS: There is no evidence of fracture or other focal bone lesions. Soft tissues are unremarkable. IMPRESSION: Negative. Electronically Signed   By: Aram Candela M.D.   On: 02/27/2020 15:37   DG Hip Unilat With Pelvis 2-3 Views Right  Result Date: 02/27/2020 CLINICAL DATA:  Status post fall. EXAM: DG HIP (WITH OR WITHOUT PELVIS) 2-3V RIGHT COMPARISON:  None. FINDINGS: There is no evidence of hip fracture or dislocation. There is no evidence of arthropathy or other focal bone abnormality. IMPRESSION: Negative. Electronically Signed   By: Aram Candela M.D.   On: 02/27/2020 15:39    Procedures Procedures (including critical care time)  Medications Ordered in ED Medications - No data to display  ED Course  I have reviewed the triage vital signs and the nursing notes.  Pertinent labs & imaging results that were available during my care of the patient were reviewed by me and considered in my medical decision making (see chart for details).    MDM Rules/Calculators/A&P                          69 year old female with history of fibromyalgia and chronic back pain who presents to the ED today with complaint of mechanical fall.  Reports that her legs are frequently weak causing her to have falls.  It this occurred earlier today causing patient to land on her right side, she did hit her head however she did not lose consciousness.  She is not anticoagulated.  She currently mostly complains of pain to her head, right shoulder, right ribs,  right hip.  Patient does report she has pain "all over" however she states when she has a fall and has new pain this will exacerbate all of her chronic pain.  Does appear we have seen patient in the past for frequent falls.  Per chart review ED note in June reports that PCP was concerned that patient's frequent falls could be related to her chronic opiate use.  PDMP reviewed, does appear patient is still on oxycodone 15 mg IR; appears PCP has tried to have her see a pain clinic however I am unsure if this actually has occurred.  We will plan to obtain CT head, CT C-spine, x-ray of right shoulder/right humerus, right ribs, right pelvis and reevaluate.  Do not feel patient needs lab work at this time.   Xrays and CT scans negative for acute abnormalities. Will discharge patient home at this time. Pt instructed to follow up with PCP regarding wants for an electric wheelchair. I will however order home PT at this time given hx of frequent falls. Pt is in agreement with plan and stable for discharge home.   This note was prepared using Dragon voice recognition software and may include unintentional dictation errors due to the inherent limitations of voice recognition software.  Final Clinical Impression(s) / ED Diagnoses Final diagnoses:  Fall, initial encounter    Rx / DC Orders ED Discharge Orders  Ordered    Home Health        02/27/20 1608    Face-to-face encounter (required for Medicare/Medicaid patients)       Comments: I Tanda Rockers certify that this patient is under my care and that I, or a nurse practitioner or physician's assistant working with me, had a face-to-face encounter that meets the physician face-to-face encounter requirements with this patient on 02/27/2020. The encounter with the patient was in whole, or in part for the following medical condition(s) which is the primary reason for home health care (List medical condition): generalized leg weakness bilaterally/frequent  falls   02/27/20 1608           Discharge Instructions     Your CT scans and xrays were negative for any fractures.  Please follow up with your PCP regarding your ED visit today as well as your recurrent falls. I have placed a referral for physical therapy to help you gain strength in your legs at home.  Continue taking your medications as prescribed.  Return to the ED for any worsening symptoms       Tanda Rockers, PA-C 02/27/20 1609    Geoffery Lyons, MD 02/27/20 1640

## 2020-02-27 NOTE — ED Triage Notes (Signed)
Per EMS, patient fell from a standing position. Patient has back pain, right arm pain and leg weakness. Patient did not lose consciousness. EMS vitals include: BP 168/96, HR 80, Respirations 18, 98% Room Air.

## 2020-02-27 NOTE — ED Notes (Signed)
Per Margaux, PA place c-collar on pt.

## 2020-02-29 ENCOUNTER — Telehealth: Payer: Self-pay

## 2020-02-29 ENCOUNTER — Ambulatory Visit: Payer: Medicare HMO | Admitting: Adult Health

## 2020-02-29 NOTE — Telephone Encounter (Addendum)
Patient had a scheduled appointment after a fall and ED visit. She did not show for her appointment. I tried contacting her x3 and she does not have VM set up on her phone. I called both of her daughters and did not get an answer. I called her son and he answered. I told him that I was not able to contact his mom and that she did not show for her appointment. He stated he would try to get in contact with her.

## 2020-03-08 ENCOUNTER — Other Ambulatory Visit: Payer: Self-pay

## 2020-03-08 MED ORDER — OXYCODONE HCL 15 MG PO TABS: 15.0000 mg | ORAL_TABLET | Freq: Three times a day (TID) | ORAL | 0 refills | Status: DC | PRN

## 2020-03-08 NOTE — Telephone Encounter (Signed)
Message left on clinical intake voicemail: Incoming call received from patient requesting refill on Oxycodone  RX last filled 02/08/2020 Treatment agreement signed on 07/20/2019 for [facility]  Patient is now using a different pharmacy and will need to update contract at [facility] [address]   I tried to call patient x 3 to inform her message was received and she would need to sign a new treatment agreement tomorrow

## 2020-03-09 ENCOUNTER — Other Ambulatory Visit: Payer: Self-pay

## 2020-03-09 ENCOUNTER — Encounter: Payer: Self-pay | Admitting: Family

## 2020-03-09 ENCOUNTER — Ambulatory Visit (INDEPENDENT_AMBULATORY_CARE_PROVIDER_SITE_OTHER): Payer: Medicare HMO | Admitting: Family

## 2020-03-09 VITALS — BP 130/80 | HR 77 | Temp 96.6°F | Resp 16 | Ht 64.0 in | Wt 203.6 lb

## 2020-03-09 DIAGNOSIS — M5441 Lumbago with sciatica, right side: Secondary | ICD-10-CM

## 2020-03-09 DIAGNOSIS — R296 Repeated falls: Secondary | ICD-10-CM | POA: Diagnosis not present

## 2020-03-09 DIAGNOSIS — G63 Polyneuropathy in diseases classified elsewhere: Secondary | ICD-10-CM | POA: Diagnosis not present

## 2020-03-09 DIAGNOSIS — M797 Fibromyalgia: Secondary | ICD-10-CM | POA: Diagnosis not present

## 2020-03-09 DIAGNOSIS — R29898 Other symptoms and signs involving the musculoskeletal system: Secondary | ICD-10-CM | POA: Diagnosis not present

## 2020-03-09 DIAGNOSIS — G894 Chronic pain syndrome: Secondary | ICD-10-CM

## 2020-03-09 DIAGNOSIS — R2681 Unsteadiness on feet: Secondary | ICD-10-CM | POA: Diagnosis not present

## 2020-03-09 DIAGNOSIS — M5442 Lumbago with sciatica, left side: Secondary | ICD-10-CM

## 2020-03-09 DIAGNOSIS — G8929 Other chronic pain: Secondary | ICD-10-CM

## 2020-03-09 DIAGNOSIS — G43909 Migraine, unspecified, not intractable, without status migrainosus: Secondary | ICD-10-CM | POA: Diagnosis not present

## 2020-03-09 DIAGNOSIS — L600 Ingrowing nail: Secondary | ICD-10-CM | POA: Diagnosis not present

## 2020-03-09 NOTE — Progress Notes (Signed)
Provider: Richarda Blade FNP-C  Heman Que, [name], NP  Patient Care Team: Keasia Dubose, [name], NP as PCP - General (Family Medicine)  Extended Emergency Contact Information Primary Emergency Contact: Debe Coder [place] Mobile Phone: [phone] Relation: Son Secondary Emergency Contact: Jerrye Beavers [place] Mobile Phone: [phone] Relation: Daughter  Code Status: Full Code  Goals of care: Advanced Directive information Advanced Directives 03/09/2020  Does Patient Have a Medical Advance Directive? No  Type of Advance Directive -  Does patient want to make changes to medical advance directive? -  Copy of Healthcare Power of Attorney in Chart? -  Would patient like information on creating a medical advance directive? No - Patient declined     Chief Complaint  Patient presents with  . Referral    Patient need a referral for home aid.  . Form Completion    Patient needs to sign new treatment agreement.    HPI:  Pt is a 69 y.o. female seen today for an acute visit for referral to home health for physical therapy to evaluate for a power wheelchair.Has had frequent falls.she continues to complain of lower back pain rating 20 on scale.Pain is constant " hurts all day.describes pain as continuous throbbing pulsating pain. Also hurts on hands.feels like legs and hands feel weak and shaky all the time.  Has throbbing pain at bottom of the feet and up to her thighs.It's difficult to on obtain HPI just keep describing pain all over.  She request Nurse aide to help with her ADl's.states unable to stand long to cook.she has a shower bench in her shower and a grab bar able to hold onto. She has lost 17.4 lbs over 5 months due to not being able to cook for herself.   Has been referred to pain management clinic multiple times but states did not get any phone call.states does not answer her phone if she doesn't know the number due to spasms.  Addendum:    She is due for her routine urine drug test.CMA has given her two cups of water but states unable to void.blood draw recommended but patient left when lab tech stepped out of room to pickup supplies.she later called and stated she can collect urine at home then drop it to [facility] office.patient was advised need to collect urine while at the office.She was made aware will need to cut down on pain medication or discontinue if no specimen is collected.Appointment was given but was no show. we attempt in the past too to collect urine specimen but patient " passed out" briefly in the toilet required assistance to chair.No loss of consciousness was noted.  Past Medical History:  Diagnosis Date  . Acute kidney injury (HCC)   . Allergy   . Anxiety   . Arthritis    "99% of my body" (11/11/2016)  . Asthma   . Bursitis of both hips   . Cataract   . Cervical scoliosis   . Chronic back pain    "all over my back" (11/11/2016)  . Depressive disorder, not elsewhere classified   . Enthesopathy of hip region   . Environmental allergies    "I take Claritin qd; 365 days/year" (11/11/2016)  . Essential hypertension, benign   . Fibromyalgia   . Frequent falls   . GERD (gastroesophageal reflux disease)   . Headache    "at least 1/wk; may last for 2 days or so" (11/11/2016)  . History of blood transfusion 1985  w/hysterectomy  . Lumbar stenosis   . Migraine    "a few/month" (11/11/2016)  . Muscle weakness (generalized)   . Myalgia and myositis, unspecified   . Osteoporosis   . Other abnormal blood chemistry   . Other malaise and fatigue   . Raynaud's syndrome   . Sciatic nerve pain, left   . Sciatica   . Unspecified vitamin D deficiency    Past Surgical History:  Procedure Laterality Date  . ABDOMINAL HYSTERECTOMY  1985  . CESAREAN SECTION  1976; 1979  . IR KYPHO LUMBAR INC FX REDUCE BONE BX UNI/BIL CANNULATION INC/IMAGING  11/04/2019  . SHOULDER OPEN ROTATOR CUFF REPAIR Left     No Known  Allergies  Outpatient Encounter Medications as of 03/09/2020  Medication Sig  . acetaminophen (TYLENOL) 500 MG tablet Take 1,000 mg by mouth every 8 (eight) hours. For Pain  . amitriptyline (ELAVIL) 150 MG tablet Take 1 tablet (150 mg total) by mouth at bedtime.  Marland Kitchen ascorbic acid (VITAMIN C) 500 MG tablet Take 1 tablet (500 mg total) by mouth 2 (two) times daily.  Marland Kitchen aspirin EC 81 MG tablet Take 81 mg by mouth daily.  . carboxymethylcellulose (REFRESH PLUS) 0.5 % SOLN Place 2 drops into both eyes 2 (two) times daily as needed (dry eyes).   . citalopram (CELEXA) 40 MG tablet Take 0.5 tablets (20 mg total) by mouth daily.  . [employer] D3 50 MCG (2000 UT) CAPS TAKE 1 CAPSULE BY MOUTH EVERY DAY  . fluticasone (FLONASE) 50 MCG/ACT nasal spray Place 2 sprays into both nostrils daily as needed for allergies or rhinitis.  . furosemide (LASIX) 40 MG tablet Take 1 tablet (40 mg total) by mouth daily.  Marland Kitchen loratadine (CLARITIN) 10 MG tablet TAKE 1 TABLET BY MOUTH EVERY DAY  . metoprolol tartrate (LOPRESSOR) 50 MG tablet TAKE ONE TABLET BY MOUTH TWICE DAILY FOR BLOOD PRESSURE  . oxyCODONE (ROXICODONE) 15 MG immediate release tablet Take 1 tablet (15 mg total) by mouth every 8 (eight) hours as needed for pain.  . SUMAtriptan (IMITREX) 25 MG tablet TAKE ONE TABLET BY MOUTH EVERY 2 HOURS AS NEEDED FOR MIGRAINE OR HEADACHE. MAY REPEAT IN 2 HOURS IF PERSISTS/RECURS.  . vitamin B-12 (CYANOCOBALAMIN) 500 MCG tablet Take 1 tablet (500 mcg total) by mouth daily.   No facility-administered encounter medications on file as of 03/09/2020.    Review of Systems  Constitutional: Negative for appetite change, chills and fatigue.  HENT: Negative for congestion, rhinorrhea, sinus pressure, sinus pain, sneezing, sore throat and trouble swallowing.   Respiratory: Negative for cough, chest tightness, shortness of breath and wheezing.   Cardiovascular: Negative for chest pain, palpitations and leg swelling.  Gastrointestinal: Negative  for abdominal distention, abdominal pain, constipation, diarrhea, nausea and vomiting.  Endocrine: Negative for cold intolerance, heat intolerance, polydipsia, polyphagia and polyuria.  Genitourinary: Negative for difficulty urinating, dysuria, flank pain, frequency and urgency.  Musculoskeletal: Positive for arthralgias, back pain and gait problem. Negative for joint swelling, myalgias and neck pain.  Skin: Negative for color change, pallor, rash and wound.  Neurological: Positive for weakness and headaches. Negative for dizziness, seizures, speech difficulty, light-headedness and numbness.       Chronic migraine   Hematological: Does not bruise/bleed easily.  Psychiatric/Behavioral: Negative for agitation, confusion and sleep disturbance. The patient is not nervous/anxious.     Immunization History  Administered Date(s) Administered  . Fluad Quad(high Dose 65+) 03/29/2019  . Influenza,inj,Quad PF,6+ Mos 05/23/2015, 05/28/2016, 03/24/2018  . Influenza-Unspecified  07/07/2012, 02/28/2014  . PFIZER SARS-COV-2 Vaccination 10/03/2019  . Pneumococcal Conjugate-13 07/14/2014  . Pneumococcal Polysaccharide-23 09/11/2017, 11/03/2019  . Tdap 01/26/2016   Pertinent  Health Maintenance Due  Topic Date Due  . COLONOSCOPY  Never done  . MAMMOGRAM  09/04/2019  . INFLUENZA VACCINE  01/29/2020  . DEXA SCAN  Completed  . PNA vac Low Risk Adult  Completed   Fall Risk  03/09/2020 12/06/2019 11/17/2019 10/05/2019 07/20/2019  Falls in the past year? Number falls in past yr: Comment - - - - -  Injury with Fall? Comment - - - - -  Risk Factor Category  - - - - -  Risk for fall due to : - - - - -  Follow up - - - - -    Vitals:   03/09/20 1000  BP: 130/80  Pulse: 77  Resp: 16  Temp: (!) 96.6 F (35.9 C)  SpO2: 96%  Weight: 203 lb 9.6 oz (92.4 kg)  Height:  (1.626 m)   Body mass index is 34.95 kg/m. Physical Exam Vitals reviewed.  Constitutional:       General: She is not in acute distress.    Appearance: She is not ill-appearing.  HENT:     Head: Normocephalic.     Mouth/Throat:     Mouth: Mucous membranes are moist.     Pharynx: Oropharynx is clear. No oropharyngeal exudate or posterior oropharyngeal erythema.  Eyes:     General: No scleral icterus.       Right eye: No discharge.        Left eye: No discharge.     Conjunctiva/sclera: Conjunctivae normal.     Pupils: Pupils are equal, round, and reactive to light.  Cardiovascular:     Pulses: Normal pulses.     Heart sounds: Normal heart sounds. No murmur heard.  No friction rub. No gallop.   Pulmonary:     Effort: Pulmonary effort is normal. No respiratory distress.     Breath sounds: Normal breath sounds. No wheezing, rhonchi or rales.  Chest:     Chest wall: No tenderness.  Abdominal:     General: Bowel sounds are normal. There is no distension.     Palpations: Abdomen is soft. There is no mass.     Tenderness: There is no abdominal tenderness. There is no right CVA tenderness, left CVA tenderness, guarding or rebound.  Musculoskeletal:     Lumbar back: No edema or spasms. Normal range of motion. Negative right straight leg raise test and negative left straight leg raise test.     Right lower leg: No edema.     Left lower leg: No edema.     Comments: Jerks with light palpation flank- lower back due to chronic pain   Skin:    General: Skin is warm.     Coloration: Skin is not pale.     Findings: No bruising, erythema or rash.  Neurological:     Mental Status: She is alert and oriented to person, place, and time.     Cranial Nerves: No cranial nerve deficit.     Sensory: No sensory deficit.     Motor: Weakness present.     Coordination: Coordination normal.     Gait: Gait abnormal.  Psychiatric:        Mood and Affect: Mood normal.  Behavior: Behavior normal.        Thought Content: Thought content normal.        Judgment: Judgment normal.    Labs  reviewed: Recent Labs    11/02/19 0244 11/03/19 0235 11/04/19 0307  NA 143 139 135  K 3.7 4.5 4.5  CL 104 105 103  CO2 GLUCOSE 102* 93 97  BUN CREATININE 1.25* 1.40* 1.42*  CALCIUM 9.1 8.6* 8.5*  MG  --  2.2 2.1  PHOS  --  3.6  --    Recent Labs    09/12/19 1240 09/12/19 1240 10/05/19 0942 11/03/19 0235 11/04/19 0307  AST 17   < > 15 12* 12*  ALT 16   < > ALKPHOS 71  --   --  54 54  BILITOT 0.6   < > 0.7 0.5 0.7  PROT 7.6   < > 7.0 6.3* 6.1*  ALBUMIN 4.2  --   --  3.5 3.4*   < > = values in this interval not displayed.   Recent Labs    09/12/19 1240 09/22/19 0000 10/05/19 0942 10/05/19 0942 11/01/19 1352 11/01/19 1352 11/02/19 0244 11/03/19 0235 11/04/19 0307  WBC   < > 5.7 4.9   < > 6.9   < > 6.5 6.5 6.6  NEUTROABS  --  2 1,950  --  3.0  --   --   --   --   HGB   < > 11.0* 12.1   < > 11.6*   < > 11.0* 9.8* 10.0*  HCT   < > 34* 37.0   < > 38.0   < > 35.8* 33.1* 32.9*  MCV   < >  --  85.1   < > 90.7   < > 90.9 92.2 92.7  PLT   < > 315 386   < > 311   < > 292 300 305   < > = values in this interval not displayed.   Lab Results  Component Value Date   TSH 1.58 10/05/2019   Lab Results  Component Value Date   HGBA1C 5.3 03/29/2019   Lab Results  Component Value Date   CHOL 194 10/05/2019   HDL 46 (L) 10/05/2019   LDLCALC 127 (H) 10/05/2019   TRIG 107 10/05/2019   CHOLHDL 4.2 10/05/2019    Significant Diagnostic Results in last 30 days:  DG Ribs Unilateral W/Chest Right  Result Date: 02/27/2020 CLINICAL DATA:  Status post fall with subsequent right rib pain. EXAM: RIGHT RIBS AND CHEST - 3+ VIEW COMPARISON:  Nov 01, 2019 FINDINGS: A radiopaque marker was placed at the site of the patient's pain. No fracture or other bone lesions are seen involving the ribs. There is no evidence of pneumothorax or pleural effusion. Both lungs are clear. Heart size and mediastinal contours are within normal limits. IMPRESSION: Negative.  Electronically Signed   By: Aram Candela M.D.   On: 02/27/2020 15:42   DG Shoulder Right  Result Date: 02/27/2020 CLINICAL DATA:  Right shoulder pain after fall EXAM: RIGHT SHOULDER - 2+ VIEW COMPARISON:  08/03/2015 FINDINGS: There is no evidence of fracture or dislocation. Mild degenerative changes of the acromioclavicular and glenohumeral joints. Soft tissues are unremarkable. IMPRESSION: Mild degenerative changes of the right shoulder. No acute fracture or dislocation. Electronically Signed   By: Duanne Guess D.O.   On: 02/27/2020 15:36   CT Head Wo Contrast  Result  Date: 02/27/2020 CLINICAL DATA:  Head trauma, minor. Neck trauma. Additional history provided: Fall from standing position, back pain, right arm pain, leg weakness. EXAM: CT HEAD WITHOUT CONTRAST CT CERVICAL SPINE WITHOUT CONTRAST TECHNIQUE: Multidetector CT imaging of the head and cervical spine was performed following the standard protocol without intravenous contrast. Multiplanar CT image reconstructions of the cervical spine were also generated. COMPARISON:  Prior head CT examinations 12/08/2019 and earlier. CT of the cervical spine 10/11/2017. FINDINGS: CT HEAD FINDINGS Brain: Stable, mild generalized parenchymal atrophy. There is no acute intracranial hemorrhage. No demarcated cortical infarct. No extra-axial fluid collection. No evidence of intracranial mass. No midline shift. Partially empty sella turcica. Vascular: No hyperdense vessel. Skull: Normal. Negative for fracture or focal suspicious osseous lesion. Sinuses/Orbits: Visualized orbits show no acute finding. No significant paranasal sinus disease or mastoid effusion at the imaged levels. CT CERVICAL SPINE FINDINGS Alignment: Straightening of the expected cervical lordosis. No significant spondylolisthesis. Skull base and vertebrae: The basion-dental and atlanto-dental intervals are maintained.No evidence of acute fracture to the cervical spine. Soft tissues and spinal  canal: No prevertebral fluid or swelling. No visible canal hematoma. Disc levels: Mild cervical spondylosis. No high-grade bony spinal canal or neural foraminal narrowing. Upper chest: No consolidation within the imaged lung apices. No visible pneumothorax. IMPRESSION: CT head: 1. No evidence of acute intracranial abnormality. 2. Stable, mild generalized parenchymal atrophy. CT cervical spine: No evidence of acute fracture to the cervical spine. Electronically Signed   By: Jackey Loge DO   On: 02/27/2020 15:58   CT Cervical Spine Wo Contrast  Result Date: 02/27/2020 CLINICAL DATA:  Head trauma, minor. Neck trauma. Additional history provided: Fall from standing position, back pain, right arm pain, leg weakness. EXAM: CT HEAD WITHOUT CONTRAST CT CERVICAL SPINE WITHOUT CONTRAST TECHNIQUE: Multidetector CT imaging of the head and cervical spine was performed following the standard protocol without intravenous contrast. Multiplanar CT image reconstructions of the cervical spine were also generated. COMPARISON:  Prior head CT examinations 12/08/2019 and earlier. CT of the cervical spine 10/11/2017. FINDINGS: CT HEAD FINDINGS Brain: Stable, mild generalized parenchymal atrophy. There is no acute intracranial hemorrhage. No demarcated cortical infarct. No extra-axial fluid collection. No evidence of intracranial mass. No midline shift. Partially empty sella turcica. Vascular: No hyperdense vessel. Skull: Normal. Negative for fracture or focal suspicious osseous lesion. Sinuses/Orbits: Visualized orbits show no acute finding. No significant paranasal sinus disease or mastoid effusion at the imaged levels. CT CERVICAL SPINE FINDINGS Alignment: Straightening of the expected cervical lordosis. No significant spondylolisthesis. Skull base and vertebrae: The basion-dental and atlanto-dental intervals are maintained.No evidence of acute fracture to the cervical spine. Soft tissues and spinal canal: No prevertebral fluid or  swelling. No visible canal hematoma. Disc levels: Mild cervical spondylosis. No high-grade bony spinal canal or neural foraminal narrowing. Upper chest: No consolidation within the imaged lung apices. No visible pneumothorax. IMPRESSION: CT head: 1. No evidence of acute intracranial abnormality. 2. Stable, mild generalized parenchymal atrophy. CT cervical spine: No evidence of acute fracture to the cervical spine. Electronically Signed   By: Jackey Loge DO   On: 02/27/2020 15:58   DG Humerus Right  Result Date: 02/27/2020 CLINICAL DATA:  Status post fall. EXAM: RIGHT HUMERUS - 2+ VIEW COMPARISON:  None. FINDINGS: There is no evidence of fracture or other focal bone lesions. Soft tissues are unremarkable. IMPRESSION: Negative. Electronically Signed   By: Aram Candela M.D.   On: 02/27/2020 15:37   DG Hip Unilat With Pelvis  2-3 Views Right  Result Date: 02/27/2020 CLINICAL DATA:  Status post fall. EXAM: DG HIP (WITH OR WITHOUT PELVIS) 2-3V RIGHT COMPARISON:  None. FINDINGS: There is no evidence of hip fracture or dislocation. There is no evidence of arthropathy or other focal bone abnormality. IMPRESSION: Negative. Electronically Signed   By: Aram Candela M.D.   On: 02/27/2020 15:39    Assessment/Plan 1. Unsteady gait On friends scooter this visit.request evaluation for her own power wheelchair.unable to stand for prolong time when cooking or doing her ADL's due to pain,leg weakness and shaking.  - Ambulatory referral to Home Health for Physical Therapy to evaluate for power wheelchair   2. Frequent falls Her oxycodone and lower back pain could be a contributory factor too. - Ambulatory referral to Home Health - Ambulatory referral to Neurology for evaluation of worsening lower extremities weakness.  3. Chronic bilateral low back pain with bilateral sciatica Attempted to collect urine for drug test as above on HPI - Ambulatory referral to Home Health to evaluate for power wheelchair  has had multiple falls and hospital and ED visit. - Ambulatory referral to Neurology - Ambulatory referral to Pain Clinic  4. Weakness of both lower extremities Worsening with multiple falls Neuropathy and lower back pain could be a contributory factor  - Ambulatory referral to Home Health - Ambulatory referral to Neurology  5. Polyneuropathy associated with underlying disease (HCC) - Ambulatory referral to Neurology - Ambulatory referral to Home Health  6. Migraine without status migrainosus, not intractable, unspecified migraine type Chronic.continue current medication  - continue on Imitrex 25 mg tablet  - Ambulatory referral to Neurology - Ambulatory referral to Pain Clinic  7. Chronic pain syndrome Continue on oxycodone will wean off  - Ambulatory referral to Home Health - Ambulatory referral to Neurology - Ambulatory referral to Pain Clinic  8. Fibromyalgia Continue on oxycodone  - Ambulatory referral to Pain Clinic  9. Ingrown toenail of right foot Right great toe.No signs of infection. - Ambulatory referral to Podiatry  Family/ staff Communication: Reviewed plan of care with patient verbalized understanding.  Labs/tests ordered: left prior to collecting urine drug test /blood draw.   Next Appointment: Has upcoming appointment in 04/02/2020   Caesar Bookman, NP

## 2020-03-12 ENCOUNTER — Other Ambulatory Visit: Payer: Medicare HMO

## 2020-03-13 ENCOUNTER — Other Ambulatory Visit: Payer: Self-pay | Admitting: Family

## 2020-03-13 DIAGNOSIS — F332 Major depressive disorder, recurrent severe without psychotic features: Secondary | ICD-10-CM

## 2020-03-14 NOTE — Patient Instructions (Addendum)
Call Make a sooner appointment to collect urine specimen at [facility] office.

## 2020-03-16 DIAGNOSIS — M797 Fibromyalgia: Secondary | ICD-10-CM

## 2020-03-16 DIAGNOSIS — G629 Polyneuropathy, unspecified: Secondary | ICD-10-CM | POA: Diagnosis not present

## 2020-03-16 DIAGNOSIS — R296 Repeated falls: Secondary | ICD-10-CM

## 2020-03-16 DIAGNOSIS — M6281 Muscle weakness (generalized): Secondary | ICD-10-CM

## 2020-03-16 DIAGNOSIS — F329 Major depressive disorder, single episode, unspecified: Secondary | ICD-10-CM

## 2020-03-16 DIAGNOSIS — Z743 Need for continuous supervision: Secondary | ICD-10-CM | POA: Diagnosis not present

## 2020-03-16 DIAGNOSIS — G894 Chronic pain syndrome: Secondary | ICD-10-CM | POA: Diagnosis not present

## 2020-03-16 DIAGNOSIS — M5441 Lumbago with sciatica, right side: Secondary | ICD-10-CM | POA: Diagnosis not present

## 2020-03-16 DIAGNOSIS — M5442 Lumbago with sciatica, left side: Secondary | ICD-10-CM | POA: Diagnosis not present

## 2020-03-20 ENCOUNTER — Telehealth: Payer: Self-pay | Admitting: *Deleted

## 2020-03-20 DIAGNOSIS — Z743 Need for continuous supervision: Secondary | ICD-10-CM | POA: Diagnosis not present

## 2020-03-20 DIAGNOSIS — F329 Major depressive disorder, single episode, unspecified: Secondary | ICD-10-CM | POA: Diagnosis not present

## 2020-03-20 DIAGNOSIS — G894 Chronic pain syndrome: Secondary | ICD-10-CM | POA: Diagnosis not present

## 2020-03-20 DIAGNOSIS — M5442 Lumbago with sciatica, left side: Secondary | ICD-10-CM | POA: Diagnosis not present

## 2020-03-20 DIAGNOSIS — M6281 Muscle weakness (generalized): Secondary | ICD-10-CM | POA: Diagnosis not present

## 2020-03-20 DIAGNOSIS — R296 Repeated falls: Secondary | ICD-10-CM | POA: Diagnosis not present

## 2020-03-20 DIAGNOSIS — M797 Fibromyalgia: Secondary | ICD-10-CM | POA: Diagnosis not present

## 2020-03-20 DIAGNOSIS — M5441 Lumbago with sciatica, right side: Secondary | ICD-10-CM | POA: Diagnosis not present

## 2020-03-20 DIAGNOSIS — R32 Unspecified urinary incontinence: Secondary | ICD-10-CM

## 2020-03-20 DIAGNOSIS — G629 Polyneuropathy, unspecified: Secondary | ICD-10-CM | POA: Diagnosis not present

## 2020-03-20 NOTE — Telephone Encounter (Signed)
Please notify patient that will need her to come in ASAP to give urine specimen before her next pain medication refill.will no longer prescribe pain medication if not in compliance.

## 2020-03-20 NOTE — Telephone Encounter (Signed)
Tried calling patient. No Answer. No voicemail set up.

## 2020-03-20 NOTE — Telephone Encounter (Signed)
Patient called requesting a Rx for Adult Pull Ups for her Incontinence.  Pended Order and sent to [facility] for approval.

## 2020-03-21 ENCOUNTER — Telehealth: Payer: Self-pay | Admitting: *Deleted

## 2020-03-21 DIAGNOSIS — Z743 Need for continuous supervision: Secondary | ICD-10-CM | POA: Diagnosis not present

## 2020-03-21 DIAGNOSIS — M5442 Lumbago with sciatica, left side: Secondary | ICD-10-CM | POA: Diagnosis not present

## 2020-03-21 DIAGNOSIS — F329 Major depressive disorder, single episode, unspecified: Secondary | ICD-10-CM | POA: Diagnosis not present

## 2020-03-21 DIAGNOSIS — M5441 Lumbago with sciatica, right side: Secondary | ICD-10-CM | POA: Diagnosis not present

## 2020-03-21 DIAGNOSIS — R296 Repeated falls: Secondary | ICD-10-CM | POA: Diagnosis not present

## 2020-03-21 DIAGNOSIS — G629 Polyneuropathy, unspecified: Secondary | ICD-10-CM | POA: Diagnosis not present

## 2020-03-21 DIAGNOSIS — G894 Chronic pain syndrome: Secondary | ICD-10-CM | POA: Diagnosis not present

## 2020-03-21 DIAGNOSIS — M6281 Muscle weakness (generalized): Secondary | ICD-10-CM | POA: Diagnosis not present

## 2020-03-21 DIAGNOSIS — M797 Fibromyalgia: Secondary | ICD-10-CM | POA: Diagnosis not present

## 2020-03-21 NOTE — Telephone Encounter (Signed)
Patient states she fell this morning and hurt her leg and will not be able to make it to her appt today-please cancel.

## 2020-03-22 ENCOUNTER — Ambulatory Visit: Payer: Medicare HMO | Admitting: Podiatry

## 2020-03-23 DIAGNOSIS — M5442 Lumbago with sciatica, left side: Secondary | ICD-10-CM | POA: Diagnosis not present

## 2020-03-23 DIAGNOSIS — M797 Fibromyalgia: Secondary | ICD-10-CM | POA: Diagnosis not present

## 2020-03-23 DIAGNOSIS — G629 Polyneuropathy, unspecified: Secondary | ICD-10-CM | POA: Diagnosis not present

## 2020-03-23 DIAGNOSIS — M5441 Lumbago with sciatica, right side: Secondary | ICD-10-CM | POA: Diagnosis not present

## 2020-03-23 DIAGNOSIS — M6281 Muscle weakness (generalized): Secondary | ICD-10-CM | POA: Diagnosis not present

## 2020-03-23 DIAGNOSIS — R296 Repeated falls: Secondary | ICD-10-CM | POA: Diagnosis not present

## 2020-03-23 DIAGNOSIS — G894 Chronic pain syndrome: Secondary | ICD-10-CM | POA: Diagnosis not present

## 2020-03-23 DIAGNOSIS — F329 Major depressive disorder, single episode, unspecified: Secondary | ICD-10-CM | POA: Diagnosis not present

## 2020-03-23 DIAGNOSIS — Z743 Need for continuous supervision: Secondary | ICD-10-CM | POA: Diagnosis not present

## 2020-03-23 NOTE — Telephone Encounter (Signed)
Tried calling patient. No Answer. No voicemail set up.  

## 2020-03-26 NOTE — Telephone Encounter (Signed)
Tried calling patient. No Answer. No Voicemail set up.

## 2020-03-29 ENCOUNTER — Encounter: Payer: Self-pay | Admitting: *Deleted

## 2020-03-29 NOTE — Telephone Encounter (Signed)
Letter mailed to patient to call office

## 2020-04-02 ENCOUNTER — Other Ambulatory Visit: Payer: Medicare HMO

## 2020-04-03 ENCOUNTER — Ambulatory Visit (INDEPENDENT_AMBULATORY_CARE_PROVIDER_SITE_OTHER): Payer: Medicare HMO | Admitting: Family

## 2020-04-03 ENCOUNTER — Other Ambulatory Visit: Payer: Self-pay

## 2020-04-03 ENCOUNTER — Encounter: Payer: Self-pay | Admitting: Family

## 2020-04-03 ENCOUNTER — Other Ambulatory Visit: Payer: Self-pay | Admitting: Family

## 2020-04-03 VITALS — BP 128/86 | HR 78 | Temp 97.7°F | Resp 18 | Ht 64.0 in

## 2020-04-03 DIAGNOSIS — I5032 Chronic diastolic (congestive) heart failure: Secondary | ICD-10-CM | POA: Diagnosis not present

## 2020-04-03 DIAGNOSIS — M797 Fibromyalgia: Secondary | ICD-10-CM | POA: Diagnosis not present

## 2020-04-03 DIAGNOSIS — E782 Mixed hyperlipidemia: Secondary | ICD-10-CM

## 2020-04-03 DIAGNOSIS — G894 Chronic pain syndrome: Secondary | ICD-10-CM

## 2020-04-03 DIAGNOSIS — F332 Major depressive disorder, recurrent severe without psychotic features: Secondary | ICD-10-CM

## 2020-04-03 DIAGNOSIS — I10 Essential (primary) hypertension: Secondary | ICD-10-CM | POA: Diagnosis not present

## 2020-04-03 DIAGNOSIS — G43009 Migraine without aura, not intractable, without status migrainosus: Secondary | ICD-10-CM | POA: Diagnosis not present

## 2020-04-03 DIAGNOSIS — E538 Deficiency of other specified B group vitamins: Secondary | ICD-10-CM

## 2020-04-03 DIAGNOSIS — W19XXXA Unspecified fall, initial encounter: Secondary | ICD-10-CM | POA: Diagnosis not present

## 2020-04-03 DIAGNOSIS — R2681 Unsteadiness on feet: Secondary | ICD-10-CM

## 2020-04-03 DIAGNOSIS — G63 Polyneuropathy in diseases classified elsewhere: Secondary | ICD-10-CM | POA: Diagnosis not present

## 2020-04-03 DIAGNOSIS — Z789 Other specified health status: Secondary | ICD-10-CM | POA: Diagnosis not present

## 2020-04-03 DIAGNOSIS — Y92009 Unspecified place in unspecified non-institutional (private) residence as the place of occurrence of the external cause: Secondary | ICD-10-CM

## 2020-04-03 MED ORDER — FUROSEMIDE 40 MG PO TABS: 40.0000 mg | ORAL_TABLET | Freq: Every day | ORAL | 1 refills | Status: DC

## 2020-04-03 MED ORDER — CITALOPRAM HYDROBROMIDE 40 MG PO TABS: 20.0000 mg | ORAL_TABLET | Freq: Every day | ORAL | 1 refills | Status: DC

## 2020-04-03 MED ORDER — SUMATRIPTAN SUCCINATE 25 MG PO TABS: ORAL_TABLET | ORAL | 0 refills | Status: DC

## 2020-04-03 NOTE — Telephone Encounter (Signed)
I called patient to ask about refill request and her voice mail was not set up I will try again later.

## 2020-04-03 NOTE — Telephone Encounter (Signed)
Spoke with patient while she was in office for a visit and patient states she does not need this from local pharmacy. Patient is getting medication from mail order

## 2020-04-03 NOTE — Progress Notes (Signed)
Provider: Marlowe Sax FNP-C   Konor Noren, Nelda Bucks, NP  Patient Care Team: Capri Veals, Nelda Bucks, NP as PCP - General (Family Medicine)  Extended Emergency Contact Information Primary Emergency Contact: [place] Mobile Phone: [phone] Relation: Son Secondary Emergency Contact: Amparo [place] Mobile Phone: [phone] Relation: Daughter  Code Status: Full Code  Goals of care: Advanced Directive information Advanced Directives 04/03/2020  Does Patient Have a Medical Advance Directive? No  Type of Advance Directive -  Does patient want to make changes to medical advance directive? -  Copy of [place] in Chart? -  Would patient like information on creating a medical advance directive? No - Patient declined     Chief Complaint  Patient presents with  . Follow-up    Urine collection follow-up and refill request on Oxycodone.   . Medication Management    Patient also needs a refill on Celexa, b12 supplement, Lasix, and Imitrex.   . Medical Management of Chronic Issues    6 months follow up     HPI:  Pt is a 69 y.o. female seen today for 6 months follow up for medical management of chronic diseases.she complains of lower back pain and generalized pain due to fibromyalgia.she is due for urine drug test but states unable to void despite drinking water here at the facility.On previous visit she left the office when she was advised to get blood draw by lab tech for drug test.she rates her pain 10/10.she has had frequent fall episode.States fell off a Friend's scooter sustained a bruise on right fore arm and right shin area was assisted up by the son.Denies hitting her head and no loss of consciousness. She continues to work with Physical therapy.  She request Imitrex to be refilled states has occasional chronic migraine headaches.States does not drink enough water.discussed increasing water intake  State continues  to feel depressed due to her chronic pain.States no family members to help her though states son has been helping.History very confusing.does have a daughter here in town but rarely sees her.another daughter is out of town.States taking her celexa 20 mg tablet daily. CHF - reports no worsening cough,shortness of breath or edema.on furosemide 40 mg tablet daily.   Past Medical History:  Diagnosis Date  . Acute kidney injury ([place])   . Allergy   . Anxiety   . Arthritis    "99% of my body" (11/11/2016)  . Asthma   . Bursitis of both hips   . Cataract   . Cervical scoliosis   . Chronic back pain    "all over my back" (11/11/2016)  . Depressive disorder, not elsewhere classified   . Enthesopathy of hip region   . Environmental allergies    "I take Claritin qd; 365 days/year" (11/11/2016)  . Essential hypertension, benign   . Fibromyalgia   . Frequent falls   . GERD (gastroesophageal reflux disease)   . Headache    "at least 1/wk; may last for 2 days or so" (11/11/2016)  . History of blood transfusion 1985   w/hysterectomy  . Lumbar stenosis   . Migraine    "a few/month" (11/11/2016)  . Muscle weakness (generalized)   . Myalgia and myositis, unspecified   . Osteoporosis   . Other abnormal blood chemistry   . Other malaise and fatigue   . Raynaud's syndrome   . Sciatic nerve pain, left   . Sciatica   . Unspecified vitamin D deficiency  Past Surgical History:  Procedure Laterality Date  . ABDOMINAL HYSTERECTOMY  1985  . [place]; 1979  . IR KYPHO LUMBAR INC FX REDUCE BONE BX UNI/BIL CANNULATION INC/IMAGING  11/04/2019  . SHOULDER OPEN ROTATOR CUFF REPAIR Left     No Known Allergies  Allergies as of 04/03/2020   No Known Allergies     Medication List       Accurate as of April 03, 2020 11:59 PM. If you have any questions, ask your nurse or doctor.        acetaminophen 500 MG tablet Commonly known as: TYLENOL Take 1,000 mg by mouth every 8 (eight) hours.  For Pain   amitriptyline 150 MG tablet Commonly known as: ELAVIL Take 1 tablet (150 mg total) by mouth at bedtime.   ascorbic acid 500 MG tablet Commonly known as: VITAMIN C Take 1 tablet (500 mg total) by mouth 2 (two) times daily.   aspirin EC 81 MG tablet Take 81 mg by mouth daily.   carboxymethylcellulose 0.5 % Soln Commonly known as: REFRESH PLUS Place 2 drops into both eyes 2 (two) times daily as needed (dry eyes).   citalopram 40 MG tablet Commonly known as: CELEXA Take 0.5 tablets (20 mg total) by mouth daily.   [employer] D3 50 MCG (2000 UT) Caps Generic drug: Cholecalciferol TAKE 1 CAPSULE BY MOUTH EVERY DAY   fluticasone 50 MCG/ACT nasal spray Commonly known as: FLONASE Place 2 sprays into both nostrils daily as needed for allergies or rhinitis.   furosemide 40 MG tablet Commonly known as: LASIX Take 1 tablet (40 mg total) by mouth daily.   loratadine 10 MG tablet Commonly known as: CLARITIN TAKE 1 TABLET BY MOUTH EVERY DAY   metoprolol tartrate 50 MG tablet Commonly known as: LOPRESSOR TAKE ONE TABLET BY MOUTH TWICE DAILY FOR BLOOD PRESSURE   oxyCODONE 15 MG immediate release tablet Commonly known as: ROXICODONE Take 1 tablet (15 mg total) by mouth every 8 (eight) hours as needed for pain.   SUMAtriptan 25 MG tablet Commonly known as: IMITREX May repeat in 2 hours if headache persists or recurs. What changed: See the new instructions. Changed by: Sandrea Hughs, NP   vitamin B-12 500 MCG tablet Commonly known as: CYANOCOBALAMIN Take 1 tablet (500 mcg total) by mouth daily.       Review of Systems  Constitutional: Negative for appetite change, chills, fatigue and fever.  HENT: Negative for congestion, rhinorrhea, sinus pressure, sinus pain, sneezing, sore throat and trouble swallowing.   Eyes: Negative for discharge, redness and itching.  Respiratory: Negative for cough, chest tightness, shortness of breath and wheezing.   Cardiovascular: Negative  for chest pain, palpitations and leg swelling.  Gastrointestinal: Negative for abdominal distention, abdominal pain, constipation, diarrhea, nausea and vomiting.  Endocrine: Negative for cold intolerance, heat intolerance, polydipsia, polyphagia and polyuria.  Genitourinary: Negative for difficulty urinating, dysuria, flank pain, frequency and urgency.  Musculoskeletal: Positive for arthralgias, back pain and gait problem. Negative for joint swelling, myalgias and neck pain.  Skin: Negative for color change, pallor and rash.  Neurological: Positive for headaches. Negative for dizziness, seizures, speech difficulty, weakness, light-headedness and numbness.  Hematological: Does not bruise/bleed easily.  Psychiatric/Behavioral: Negative for agitation, behavioral problems, confusion and sleep disturbance. The patient is not nervous/anxious.     Immunization History  Administered Date(s) Administered  . Fluad Quad(high Dose 65+) 03/29/2019  . Influenza,inj,Quad PF,6+ Mos 05/23/2015, 05/28/2016, 03/24/2018  . Influenza-Unspecified 07/07/2012, 02/28/2014  . PFIZER  SARS-COV-2 Vaccination 10/03/2019  . Pneumococcal Conjugate-13 07/14/2014  . Pneumococcal Polysaccharide-23 09/11/2017, 11/03/2019  . Tdap 01/26/2016   Pertinent  Health Maintenance Due  Topic Date Due  . COLONOSCOPY  Never done  . MAMMOGRAM  09/04/2019  . INFLUENZA VACCINE  01/29/2020  . DEXA SCAN  Completed  . PNA vac Low Risk Adult  Completed   Fall Risk  04/03/2020 03/09/2020 12/06/2019 11/17/2019 10/05/2019  Falls in the past year? 1 1 1 1 1   Number falls in past yr: 1 1 1 1 1   Comment - - - - -  Injury with Fall? 1 1 1 1 1   Comment - - - - -  Risk Factor Category  - - - - -  Risk for fall due to : - - - - -  Follow up - - - - -   Functional Status Survey:    Vitals:   04/03/20 1602  BP: 128/86  Pulse: 78  Resp: 18  Temp: 97.7 F (36.5 C)  SpO2: 95%  Height: 5' 4"  (1.626 m)   Body mass index is 34.95  kg/m. Physical Exam Vitals reviewed.  Constitutional:      General: She is not in acute distress.    Appearance: She is obese. She is not ill-appearing.  HENT:     Head: Normocephalic.     Right Ear: Tympanic membrane, ear canal and external ear normal. There is no impacted cerumen.     Left Ear: Tympanic membrane, ear canal and external ear normal. There is no impacted cerumen.     Nose: Nose normal. No congestion or rhinorrhea.     Mouth/Throat:     Mouth: Mucous membranes are moist.     Pharynx: Oropharynx is clear. No oropharyngeal exudate or posterior oropharyngeal erythema.  Eyes:     General: No scleral icterus.       Right eye: No discharge.        Left eye: No discharge.     Extraocular Movements: Extraocular movements intact.     Conjunctiva/sclera: Conjunctivae normal.     Pupils: Pupils are equal, round, and reactive to light.  Neck:     Vascular: No carotid bruit.  Cardiovascular:     Rate and Rhythm: Normal rate and regular rhythm.     Pulses: Normal pulses.     Heart sounds: Normal heart sounds. No murmur heard.  No friction rub. No gallop.   Pulmonary:     Effort: Pulmonary effort is normal. No respiratory distress.     Breath sounds: Normal breath sounds. No wheezing, rhonchi or rales.  Chest:     Chest wall: No tenderness.  Abdominal:     General: Bowel sounds are normal. There is no distension.     Palpations: Abdomen is soft. There is no mass.     Tenderness: There is no abdominal tenderness. There is no right CVA tenderness, left CVA tenderness, guarding or rebound.  Musculoskeletal:        General: No swelling or tenderness.     Cervical back: Normal range of motion. No rigidity or tenderness.     Right lower leg: No edema.     Left lower leg: No edema.     Comments: Unsteady gait walks short distance with Rolator.using Friend's Scooter.   Lymphadenopathy:     Cervical: No cervical adenopathy.  Skin:    General: Skin is warm and dry.      Coloration: Skin is not pale.  Findings: No bruising, erythema or rash.  Neurological:     Mental Status: She is alert and oriented to person, place, and time.     Cranial Nerves: No cranial nerve deficit.     Sensory: Sensory deficit present.     Motor: No weakness.     Coordination: Coordination normal.     Gait: Gait abnormal.  Psychiatric:        Mood and Affect: Mood normal.        Speech: Speech normal.        Behavior: Behavior normal.        Thought Content: Thought content normal.    Labs reviewed: Recent Labs    11/03/19 0235 11/04/19 0307 04/03/20 1730  NA 139 135 136  K 4.5 4.5 3.7  CL 105 103 98  CO2 26 24 26   GLUCOSE 93 97 88  BUN 13 13 10   CREATININE 1.40* 1.42* 1.22*  CALCIUM 8.6* 8.5* 9.8  MG 2.2 2.1  --   PHOS 3.6  --   --    Recent Labs    09/12/19 1240 10/05/19 0942 11/03/19 0235 11/04/19 0307 04/03/20 1730  AST 17   < > 12* 12* 12  ALT 16   < > 9 10 8   ALKPHOS 71  --  54 54  --   BILITOT 0.6   < > 0.5 0.7 0.8  PROT 7.6   < > 6.3* 6.1* 7.3  ALBUMIN 4.2  --  3.5 3.4*  --    < > = values in this interval not displayed.   Recent Labs    10/05/19 0942 10/05/19 0942 11/01/19 1352 11/02/19 0244 11/03/19 0235 11/04/19 0307 04/03/20 1730  WBC 4.9   < > 6.9   < > 6.5 6.6 6.2  NEUTROABS 1,950  --  3.0  --   --   --  2,796  HGB 12.1   < > 11.6*   < > 9.8* 10.0* 12.4  HCT 37.0   < > 38.0   < > 33.1* 32.9* 38.0  MCV 85.1   < > 90.7   < > 92.2 92.7 83.5  PLT 386   < > 311   < > 300 305 399   < > = values in this interval not displayed.   Lab Results  Component Value Date   TSH 1.31 04/03/2020   Lab Results  Component Value Date   HGBA1C 5.3 03/29/2019   Lab Results  Component Value Date   CHOL 211 (H) 04/03/2020   HDL 62 04/03/2020   LDLCALC 128 (H) 04/03/2020   TRIG 103 04/03/2020   CHOLHDL 3.4 04/03/2020    Significant Diagnostic Results in last 30 days:  No results found.  Assessment/Plan 1. Fall at home, initial  encounter Reports fall off friends scooter.unclear how she fell off.sustained bruises on right forearm and right shin area.No injuries to head.  - CBC with Differential/Platelet; Future - CMP with eGFR([facility]); Future  2. Unsteady gait Continue to working with Home health Physical Therapy. Fall and safety precaution   3. Chronic pain syndrome Water given by CMA to drink during visit reported unable to void she voided prior to visit.three times attempt to obtain urine in the past visit but patient reported unable to void.?? Pass out brief in the past trying to void to provide specimen.On previous visit she was send to lab for lab tech to draw blood for drug test but patient took off from the lab and  left office.later called from home states son could drop urine specimen.Patient was advised by staff that urine specimen had to be provided while at the office cannot accept specimen from home.In and out catheter for urine specimen obtain this visit.tolerated procedure well.  - referral to pain management special send but patient states no one has called her though several referrals have been placed in the past visit.  - DRUG MONITOR, PANEL 1, W/CONF, URINE  4. Fibromyalgia Continue oxycodone 15 mg IR every 8 hrs. - DRUG MONITOR, PANEL 1, W/CONF, URINE  5. Essential hypertension B/p at goal.No orthostatic hypotension noted.  - continue on metoprolol 50 tablet twice daily and Furosemide 40 mg tablet daily.  - CBC with Differential/Platelet; Future - CMP with eGFR([facility]); Future - TSH; Future  6. Mixed hyperlipidemia Not on statin.low carbohydrates,low saturated fats and high vegetable diet advised.Exercise limited due to falls. - Lipid panel; Future  7. Migraine without aura and without status migrainosus, not intractable - Encouraged to drink 6-8 glasses of water daily. - continue on Imtrex   8. Vitamin B12 deficiency Reports chronic fatigue possible multifactorial due to fibromyalgia and  vit B12  Continue on vit B12 supplement. - Vitamin B12; Future  Family/ staff Communication: Reviewed plan of care with patient  Labs/tests ordered:  - CBC with Differential/Platelet; Future - CMP with eGFR([facility]); Future - TSH; Future - Lipid panel; Future - Vitamin B12; Future - DRUG MONITOR, PANEL 1, W/CONF, URINE  Next Appointment : 6 months for medical management of chronic issues  Sandrea Hughs, NP

## 2020-04-04 LAB — LIPID PANEL
Cholesterol: 211 mg/dL — ABNORMAL HIGH (ref <200)
HDL: 62 mg/dL (ref >=50)
LDL Cholesterol (Calc): 128 mg/dL (calc) — ABNORMAL HIGH
Non-HDL Cholesterol (Calc): 149 mg/dL (calc) — ABNORMAL HIGH (ref <130)
Total CHOL/HDL Ratio: 3.4 (calc) (ref <5.0)
Triglycerides: 103 mg/dL (ref <150)

## 2020-04-04 LAB — CBC WITH DIFFERENTIAL/PLATELET
Absolute Monocytes: 750 cells/uL (ref 200–950)
Basophils Absolute: 50 cells/uL (ref 0–200)
Basophils Relative: 0.8 %
Eosinophils Absolute: 99 cells/uL (ref 15–500)
Eosinophils Relative: 1.6 %
HCT: 38 % (ref 35.0–45.0)
Hemoglobin: 12.4 g/dL (ref 11.7–15.5)
Lymphs Abs: 2505 cells/uL (ref 850–3900)
MCH: 27.3 pg (ref 27.0–33.0)
MCHC: 32.6 g/dL (ref 32.0–36.0)
MCV: 83.5 fL (ref 80.0–100.0)
MPV: 11.6 fL (ref 7.5–12.5)
Monocytes Relative: 12.1 %
Neutro Abs: 2796 cells/uL (ref 1500–7800)
Neutrophils Relative %: 45.1 %
Platelets: 399 10*3/uL (ref 140–400)
RBC: 4.55 10*6/uL (ref 3.80–5.10)
RDW: 11.9 % (ref 11.0–15.0)
Total Lymphocyte: 40.4 %
WBC: 6.2 10*3/uL (ref 3.8–10.8)

## 2020-04-04 LAB — COMPLETE METABOLIC PANEL WITH GFR
AG Ratio: 1.6 (calc) (ref 1.0–2.5)
ALT: 8 U/L (ref 6–29)
AST: 12 U/L (ref 10–35)
Albumin: 4.5 g/dL (ref 3.6–5.1)
Alkaline phosphatase (APISO): 79 U/L (ref 37–153)
BUN/Creatinine Ratio: 8 (calc) (ref 6–22)
BUN: 10 mg/dL (ref 7–25)
CO2: 26 mmol/L (ref 20–32)
Calcium: 9.8 mg/dL (ref 8.6–10.4)
Chloride: 98 mmol/L (ref 98–110)
Creat: 1.22 mg/dL — ABNORMAL HIGH (ref 0.50–0.99)
GFR, Est African American: 53 mL/min/{1.73_m2} — ABNORMAL LOW (ref >=60)
GFR, Est Non African American: 45 mL/min/{1.73_m2} — ABNORMAL LOW (ref >=60)
Globulin: 2.8 g/dL (calc) (ref 1.9–3.7)
Glucose, Bld: 88 mg/dL (ref 65–99)
Potassium: 3.7 mmol/L (ref 3.5–5.3)
Sodium: 136 mmol/L (ref 135–146)
Total Bilirubin: 0.8 mg/dL (ref 0.2–1.2)
Total Protein: 7.3 g/dL (ref 6.1–8.1)

## 2020-04-04 LAB — VITAMIN D 25 HYDROXY (VIT D DEFICIENCY, FRACTURES): Vit D, 25-Hydroxy: 45 ng/mL (ref 30–100)

## 2020-04-04 LAB — VITAMIN B12: Vitamin B-12: 1355 pg/mL — ABNORMAL HIGH (ref 200–1100)

## 2020-04-04 LAB — TSH: TSH: 1.31 mIU/L (ref 0.40–4.50)

## 2020-04-05 ENCOUNTER — Telehealth: Payer: Self-pay | Admitting: *Deleted

## 2020-04-05 ENCOUNTER — Ambulatory Visit: Payer: Medicare HMO | Admitting: Family

## 2020-04-05 DIAGNOSIS — Z743 Need for continuous supervision: Secondary | ICD-10-CM | POA: Diagnosis not present

## 2020-04-05 DIAGNOSIS — G894 Chronic pain syndrome: Secondary | ICD-10-CM | POA: Diagnosis not present

## 2020-04-05 DIAGNOSIS — M5442 Lumbago with sciatica, left side: Secondary | ICD-10-CM | POA: Diagnosis not present

## 2020-04-05 DIAGNOSIS — G43009 Migraine without aura, not intractable, without status migrainosus: Secondary | ICD-10-CM

## 2020-04-05 DIAGNOSIS — M797 Fibromyalgia: Secondary | ICD-10-CM | POA: Diagnosis not present

## 2020-04-05 DIAGNOSIS — G629 Polyneuropathy, unspecified: Secondary | ICD-10-CM | POA: Diagnosis not present

## 2020-04-05 DIAGNOSIS — M6281 Muscle weakness (generalized): Secondary | ICD-10-CM | POA: Diagnosis not present

## 2020-04-05 DIAGNOSIS — F329 Major depressive disorder, single episode, unspecified: Secondary | ICD-10-CM | POA: Diagnosis not present

## 2020-04-05 DIAGNOSIS — M5441 Lumbago with sciatica, right side: Secondary | ICD-10-CM | POA: Diagnosis not present

## 2020-04-05 DIAGNOSIS — R296 Repeated falls: Secondary | ICD-10-CM | POA: Diagnosis not present

## 2020-04-05 LAB — DRUG MONITOR, PANEL 1, W/CONF, URINE
Amphetamines: NEGATIVE ng/mL (ref <500)
Barbiturates: NEGATIVE ng/mL (ref <300)
Benzodiazepines: NEGATIVE ng/mL (ref <100)
Cocaine Metabolite: NEGATIVE ng/mL (ref <150)
Codeine: NEGATIVE ng/mL (ref <50)
Creatinine: 285.5 mg/dL
Hydrocodone: NEGATIVE ng/mL (ref <50)
Hydromorphone: NEGATIVE ng/mL (ref <50)
Marijuana Metabolite: NEGATIVE ng/mL (ref <20)
Methadone Metabolite: NEGATIVE ng/mL (ref <100)
Morphine: NEGATIVE ng/mL (ref <50)
Norhydrocodone: NEGATIVE ng/mL (ref <50)
Noroxycodone: 708 ng/mL — ABNORMAL HIGH (ref <50)
Opiates: NEGATIVE ng/mL (ref <100)
Oxidant: NEGATIVE ug/mL
Oxycodone: 227 ng/mL — ABNORMAL HIGH (ref <50)
Oxycodone: POSITIVE ng/mL — AB (ref <100)
Oxymorphone: 153 ng/mL — ABNORMAL HIGH (ref <50)
Phencyclidine: NEGATIVE ng/mL (ref <25)
pH: 6.4 (ref 4.5–9.0)

## 2020-04-05 LAB — DM TEMPLATE

## 2020-04-05 MED ORDER — SUMATRIPTAN SUCCINATE 25 MG PO TABS: ORAL_TABLET | ORAL | 0 refills | Status: DC

## 2020-04-05 NOTE — Telephone Encounter (Signed)
Medication directions updated and faxed to [facility].

## 2020-04-05 NOTE — Telephone Encounter (Signed)
Melinda [name] with [facility] called just wanting to let you know that patient's pain level is 8/10. Stated that patient was in front of him and appears not to be in distress. Stated that he just wanted to make you aware.

## 2020-04-05 NOTE — Telephone Encounter (Signed)
Noted  

## 2020-04-05 NOTE — Telephone Encounter (Signed)
Order clarification: Sumatriptan 25 mg tablet take one by mouth once for headache.May repeat in 2 hours if headache persists or recurs.

## 2020-04-05 NOTE — Telephone Encounter (Signed)
[facility] received your refill for patient's Sumatriptan 25mg .  Needs Clarification.  Cannot use "Ebert Forrester repeat in 2 hours if headache persists or recurs."  Please Clarify.

## 2020-04-06 ENCOUNTER — Ambulatory Visit: Payer: Medicare HMO | Admitting: Family

## 2020-04-07 ENCOUNTER — Other Ambulatory Visit: Payer: Self-pay | Admitting: Family

## 2020-04-09 ENCOUNTER — Other Ambulatory Visit: Payer: Self-pay | Admitting: Family

## 2020-04-09 ENCOUNTER — Other Ambulatory Visit: Payer: Self-pay

## 2020-04-09 DIAGNOSIS — G43009 Migraine without aura, not intractable, without status migrainosus: Secondary | ICD-10-CM

## 2020-04-09 MED ORDER — OXYCODONE HCL 15 MG PO TABS
15.0000 mg | ORAL_TABLET | Freq: Three times a day (TID) | ORAL | 0 refills | Status: DC | PRN
Start: 2020-04-09 — End: 2020-05-08

## 2020-04-09 NOTE — Telephone Encounter (Signed)
Patient is requesting refill for Oxycodone. Please Advise.

## 2020-04-13 ENCOUNTER — Telehealth: Payer: Self-pay | Admitting: *Deleted

## 2020-04-13 DIAGNOSIS — G629 Polyneuropathy, unspecified: Secondary | ICD-10-CM | POA: Diagnosis not present

## 2020-04-13 DIAGNOSIS — Z743 Need for continuous supervision: Secondary | ICD-10-CM | POA: Diagnosis not present

## 2020-04-13 DIAGNOSIS — M797 Fibromyalgia: Secondary | ICD-10-CM | POA: Diagnosis not present

## 2020-04-13 DIAGNOSIS — M6281 Muscle weakness (generalized): Secondary | ICD-10-CM | POA: Diagnosis not present

## 2020-04-13 DIAGNOSIS — M5442 Lumbago with sciatica, left side: Secondary | ICD-10-CM | POA: Diagnosis not present

## 2020-04-13 DIAGNOSIS — R296 Repeated falls: Secondary | ICD-10-CM | POA: Diagnosis not present

## 2020-04-13 DIAGNOSIS — F329 Major depressive disorder, single episode, unspecified: Secondary | ICD-10-CM | POA: Diagnosis not present

## 2020-04-13 DIAGNOSIS — G894 Chronic pain syndrome: Secondary | ICD-10-CM | POA: Diagnosis not present

## 2020-04-13 DIAGNOSIS — M5441 Lumbago with sciatica, right side: Secondary | ICD-10-CM | POA: Diagnosis not present

## 2020-04-13 NOTE — Telephone Encounter (Signed)
I agree patient need to be evaluated in Urgent Care or ED as soon as possible.May fax orders to [facility] per [facility] and extend visit.

## 2020-04-13 NOTE — Telephone Encounter (Signed)
Betsy with [facility] called stating that they received an order to evaluate for PowerWheelchair. She stated that this is through [facility] and they are requesting a FaceSheet, HH Order and last OV note to be faxed to them at Fax: [phone]  Printed and faxed.

## 2020-04-13 NOTE — Telephone Encounter (Signed)
Betsy with [facility] called back and stated she forgot to let you know that patient had a fall Tuesday off of her scooter and the scooter fell on her. She is now complaining of back pain 10/10. Stated that she is going to go to the Urgent Care for evaluation.   Also requested verbal orders to extend patient's PT for 1x4wks. Verbal order given.

## 2020-04-13 NOTE — Telephone Encounter (Signed)
Betsy with [facility] called back with an updated [facility] Fax # and requested paperwork to be faxed there. Fax#:[phone]. Faxed.

## 2020-04-16 DIAGNOSIS — M5441 Lumbago with sciatica, right side: Secondary | ICD-10-CM | POA: Diagnosis not present

## 2020-04-16 DIAGNOSIS — G894 Chronic pain syndrome: Secondary | ICD-10-CM | POA: Diagnosis not present

## 2020-04-16 DIAGNOSIS — M5442 Lumbago with sciatica, left side: Secondary | ICD-10-CM | POA: Diagnosis not present

## 2020-04-16 DIAGNOSIS — M6281 Muscle weakness (generalized): Secondary | ICD-10-CM | POA: Diagnosis not present

## 2020-04-16 DIAGNOSIS — Z743 Need for continuous supervision: Secondary | ICD-10-CM | POA: Diagnosis not present

## 2020-04-16 DIAGNOSIS — R296 Repeated falls: Secondary | ICD-10-CM | POA: Diagnosis not present

## 2020-04-16 DIAGNOSIS — M797 Fibromyalgia: Secondary | ICD-10-CM | POA: Diagnosis not present

## 2020-04-16 DIAGNOSIS — F329 Major depressive disorder, single episode, unspecified: Secondary | ICD-10-CM | POA: Diagnosis not present

## 2020-04-16 DIAGNOSIS — G629 Polyneuropathy, unspecified: Secondary | ICD-10-CM | POA: Diagnosis not present

## 2020-04-20 ENCOUNTER — Other Ambulatory Visit: Payer: Self-pay | Admitting: Family

## 2020-04-24 ENCOUNTER — Other Ambulatory Visit: Payer: Self-pay

## 2020-04-24 ENCOUNTER — Emergency Department (HOSPITAL_COMMUNITY)
Admission: EM | Admit: 2020-04-24 | Discharge: 2020-04-24 | Disposition: A | Discharge status: Home / Self Care | Payer: Medicare HMO | Attending: Emergency Medicine | Admitting: Emergency Medicine

## 2020-04-24 ENCOUNTER — Emergency Department (HOSPITAL_COMMUNITY): Payer: Medicare HMO

## 2020-04-24 DIAGNOSIS — R519 Headache, unspecified: Secondary | ICD-10-CM | POA: Insufficient documentation

## 2020-04-24 DIAGNOSIS — G4489 Other headache syndrome: Secondary | ICD-10-CM | POA: Diagnosis not present

## 2020-04-24 DIAGNOSIS — Z8616 Personal history of COVID-19: Secondary | ICD-10-CM | POA: Diagnosis not present

## 2020-04-24 DIAGNOSIS — I10 Essential (primary) hypertension: Secondary | ICD-10-CM | POA: Insufficient documentation

## 2020-04-24 DIAGNOSIS — J45909 Unspecified asthma, uncomplicated: Secondary | ICD-10-CM | POA: Insufficient documentation

## 2020-04-24 DIAGNOSIS — R52 Pain, unspecified: Secondary | ICD-10-CM | POA: Diagnosis not present

## 2020-04-24 DIAGNOSIS — M542 Cervicalgia: Secondary | ICD-10-CM | POA: Insufficient documentation

## 2020-04-24 DIAGNOSIS — M549 Dorsalgia, unspecified: Secondary | ICD-10-CM | POA: Diagnosis not present

## 2020-04-24 DIAGNOSIS — W19XXXA Unspecified fall, initial encounter: Secondary | ICD-10-CM | POA: Diagnosis not present

## 2020-04-24 DIAGNOSIS — R21 Rash and other nonspecific skin eruption: Secondary | ICD-10-CM | POA: Diagnosis not present

## 2020-04-24 DIAGNOSIS — Z043 Encounter for examination and observation following other accident: Secondary | ICD-10-CM | POA: Diagnosis not present

## 2020-04-24 DIAGNOSIS — S0990XA Unspecified injury of head, initial encounter: Secondary | ICD-10-CM | POA: Diagnosis not present

## 2020-04-24 DIAGNOSIS — R42 Dizziness and giddiness: Secondary | ICD-10-CM | POA: Diagnosis not present

## 2020-04-24 DIAGNOSIS — R079 Chest pain, unspecified: Secondary | ICD-10-CM | POA: Diagnosis not present

## 2020-04-24 LAB — CBC WITH DIFFERENTIAL/PLATELET
Abs Immature Granulocytes: 0.04 10*3/uL (ref 0.00–0.07)
Basophils Absolute: 0.1 10*3/uL (ref 0.0–0.1)
Basophils Relative: 1 %
Eosinophils Absolute: 0.1 10*3/uL (ref 0.0–0.5)
Eosinophils Relative: 2 %
HCT: 42.8 % (ref 36.0–46.0)
Hemoglobin: 13.3 g/dL (ref 12.0–15.0)
Immature Granulocytes: 1 %
Lymphocytes Relative: 38 %
Lymphs Abs: 2.6 10*3/uL (ref 0.7–4.0)
MCH: 27.3 pg (ref 26.0–34.0)
MCHC: 31.1 g/dL (ref 30.0–36.0)
MCV: 87.9 fL (ref 80.0–100.0)
Monocytes Absolute: 0.9 10*3/uL (ref 0.1–1.0)
Monocytes Relative: 13 %
Neutro Abs: 3 10*3/uL (ref 1.7–7.7)
Neutrophils Relative %: 45 %
Platelets: 336 10*3/uL (ref 150–400)
RBC: 4.87 MIL/uL (ref 3.87–5.11)
RDW: 12.4 % (ref 11.5–15.5)
WBC: 6.7 10*3/uL (ref 4.0–10.5)
nRBC: 0 % (ref 0.0–0.2)

## 2020-04-24 LAB — BASIC METABOLIC PANEL
Anion gap: 12 (ref 5–15)
BUN: 9 mg/dL (ref 8–23)
CO2: 25 mmol/L (ref 22–32)
Calcium: 9.4 mg/dL (ref 8.9–10.3)
Chloride: 103 mmol/L (ref 98–111)
Creatinine, Ser: 1.2 mg/dL — ABNORMAL HIGH (ref 0.44–1.00)
GFR, Estimated: 49 mL/min — ABNORMAL LOW (ref >60)
Glucose, Bld: 90 mg/dL (ref 70–99)
Potassium: 3.6 mmol/L (ref 3.5–5.1)
Sodium: 140 mmol/L (ref 135–145)

## 2020-04-24 LAB — D-DIMER, QUANTITATIVE: D-Dimer, Quant: <0.27 ug/mL-FEU (ref 0.00–0.50)

## 2020-04-24 LAB — TROPONIN I (HIGH SENSITIVITY): Troponin I (High Sensitivity): 3 ng/L (ref <18)

## 2020-04-24 NOTE — ED Provider Notes (Signed)
[facility]-EMERGENCY DEPT Provider Note   CSN: [id number] Arrival date & time: 04/24/20  1552     History Chief Complaint  Patient presents with  . Fall  . Leg Pain  . Head Injury    Melinda [name] is a 69 y.o. female.  HPI   Patient with significant medical history of fibromyalgia, chronic back pain, migraines presents to the emergency department after having a fall.  Patient states she fell yesterday night and landed on her back.  EMS was called out because she was unable to get up,  she refused to come to the emergency department.  She then woke up this morning and try to get out of bed to make coffee but fell again landing on her right side.  Patient denies hitting her head, losing conscious, is not on anticoags.  Patient states she has multiple falls because she has weak legs.  She states both her leg weakness and arm weakness are chronic and she has been dealing with this for over 1 year.  She states when she is walking her legs give out causing her falls to the floor.  She denies losing her balance, becoming dizzy, having chest pain or shortness of breath.  She has been seen multiple times for falls in the past and currently has home PT helping her gain back her strength. .  She states she currently has a headache as well as neck and back pain.  She denies abdominal pain, nausea, vomiting, diarrhea, hematuria, worsening pedal edema.  Past Medical History:  Diagnosis Date  . Acute kidney injury (HCC)   . Allergy   . Anxiety   . Arthritis    "99% of my body" (11/11/2016)  . Asthma   . Bursitis of both hips   . Cataract   . Cervical scoliosis   . Chronic back pain    "all over my back" (11/11/2016)  . Depressive disorder, not elsewhere classified   . Enthesopathy of hip region   . Environmental allergies    "I take Claritin qd; 365 days/year" (11/11/2016)  . Essential hypertension, benign   . Fibromyalgia   . Frequent falls   . GERD (gastroesophageal  reflux disease)   . Headache    "at least 1/wk; may last for 2 days or so" (11/11/2016)  . History of blood transfusion 1985   w/hysterectomy  . Lumbar stenosis   . Migraine    "a few/month" (11/11/2016)  . Muscle weakness (generalized)   . Myalgia and myositis, unspecified   . Osteoporosis   . Other abnormal blood chemistry   . Other malaise and fatigue   . Raynaud's syndrome   . Sciatic nerve pain, left   . Sciatica   . Unspecified vitamin D deficiency     Patient Active Problem List   Diagnosis Date Noted  . Chronic back pain   . Closed compression fracture of first lumbar vertebra (HCC) 11/02/2019  . Anxiety and depression 11/02/2019  . Hypokalemia 11/02/2019  . Dry eyes, bilateral 10/05/2019  . Mixed hyperlipidemia 10/05/2019  . Lower extremity weakness 09/18/2019  . Migraine headache 09/18/2019  . Acute kidney injury (HCC) 05/10/2019  . Near syncope 05/09/2019  . Fall at home, initial encounter 05/09/2019  . History of 2019 novel coronavirus disease (COVID-19) 05/09/2019  . Anemia of chronic disease 05/09/2019  . Acute on chronic renal insufficiency 05/09/2019  . COVID-19 virus infection 10/12/2018  . Polyneuropathy in diseases classified elsewhere (HCC) 10/04/2018  . Prediabetes 09/12/2017  .  High risk medication use 09/12/2017  . Frequent falls 09/12/2017  . Chronic pain syndrome 08/14/2017  . Gastroesophageal reflux disease 08/14/2017  . Cough variant asthma 03/14/2017  . Pulmonary hypertension (HCC) 03/12/2017  . Right-sided chest pain 11/11/2016  . Asthma 11/11/2016  . Atypical chest pain   . Costochondritis   . Chronic congestive heart failure (HCC)   . MDD (major depressive disorder), recurrent episode (HCC) 05/23/2015  . Allergic rhinitis 11/03/2014  . Paresthesia 09/08/2014  . Peripheral neuropathy 07/14/2014  . Morbid obesity due to excess calories (HCC) 07/14/2014  . Pain in limb 07/13/2014  . Abnormality of gait 07/13/2014  . Fibromyalgia  06/13/2014  . Spinal stenosis 01/27/2013  . HTN (hypertension) 11/30/2012    Past Surgical History:  Procedure Laterality Date  . ABDOMINAL HYSTERECTOMY  1985  . CESAREAN SECTION  1976; 1979  . IR KYPHO LUMBAR INC FX REDUCE BONE BX UNI/BIL CANNULATION INC/IMAGING  11/04/2019  . SHOULDER OPEN ROTATOR CUFF REPAIR Left      OB History   No obstetric history on file.     Family History  Problem Relation Age of Onset  . Dementia Mother   . Heart disease Mother   . Hypertension Mother   . Diabetes Mother   . Diabetes Sister   . Cancer Sister        lung  . Cancer Other        Nepher (Mothers side)  . Diabetes Maternal Grandmother   . Arthritis Maternal Grandmother   . Diabetes Cousin        Mother's side  . Arthritis Sister   . Arthritis Cousin        Mother's side   . Colon cancer Neg Hx   . Esophageal cancer Neg Hx   . Liver cancer Neg Hx   . Pancreatic cancer Neg Hx   . Rectal cancer Neg Hx   . Stomach cancer Neg Hx     Social History   Tobacco Use  . Smoking status: Never Smoker  . Smokeless tobacco: Never Used  Vaping Use  . Vaping Use: Never used  Substance Use Topics  . Alcohol use: No    Alcohol/week: 0.0 standard drinks  . Drug use: No    Home Medications Prior to Admission medications   Medication Sig Start Date End Date Taking? Authorizing Provider  acetaminophen (TYLENOL) 500 MG tablet Take 1,000 mg by mouth every 8 (eight) hours. For Pain    [provider]  amitriptyline (ELAVIL) 150 MG tablet Take 1 tablet (150 mg total) by mouth at bedtime. 02/23/20   [name], [name] C, NP  ascorbic acid (VITAMIN C) 500 MG tablet Take 1 tablet (500 mg total) by mouth 2 (two) times daily. 09/26/19   Margit Hanks, MD  aspirin EC 81 MG tablet Take 81 mg by mouth daily.    [provider]  carboxymethylcellulose (REFRESH PLUS) 0.5 % SOLN Place 2 drops into both eyes 2 (two) times daily as needed (dry eyes).     [provider]    citalopram (CELEXA) 40 MG tablet Take 0.5 tablets (20 mg total) by mouth daily. 04/03/20   [name], [name] C, NP  [employer] D3 50 MCG (2000 UT) CAPS TAKE 1 CAPSULE BY MOUTH EVERY DAY 11/08/19   [name], [name] C, NP  fluticasone (FLONASE) 50 MCG/ACT nasal spray Place 2 sprays into both nostrils daily as needed for allergies or rhinitis. 02/23/20   [name], [name] C, NP  furosemide (LASIX) 40  MG tablet Take 1 tablet (40 mg total) by mouth daily. 04/03/20   [name], [name] C, NP  loratadine (CLARITIN) 10 MG tablet TAKE 1 TABLET BY MOUTH EVERY DAY 04/09/20   [name], [name] C, NP  metoprolol tartrate (LOPRESSOR) 50 MG tablet TAKE ONE TABLET BY MOUTH TWICE DAILY FOR BLOOD PRESSURE 02/23/20   [name], [name] C, NP  oxyCODONE (ROXICODONE) 15 MG immediate release tablet Take 1 tablet (15 mg total) by mouth every 8 (eight) hours as needed for pain. 04/09/20   [name], [name] C, NP  SUMAtriptan (IMITREX) 25 MG tablet TAKE ONE TABLET BY MOUTH EVERY 2 HOURS AS NEEDED FOR MIGRAINE OR HEADACHE. MAY REPEAT IN 2 HOURS IF PERSISTS/RECURS. 04/09/20   [name], [name] C, NP  vitamin B-12 (CYANOCOBALAMIN) 500 MCG tablet Take 1 tablet (500 mcg total) by mouth daily. 09/26/19   Margit Hanks, MD    Allergies    Patient has no known allergies.  Review of Systems   Review of Systems  Constitutional: Negative for chills and fever.  HENT: Negative for congestion, tinnitus, trouble swallowing and voice change.   Eyes: Negative for visual disturbance.  Respiratory: Negative for cough and shortness of breath.   Cardiovascular: Negative for chest pain.  Gastrointestinal: Negative for abdominal pain and vomiting.  Genitourinary: Negative for enuresis.  Musculoskeletal: Positive for back pain and gait problem.       Patient complains of back pain.  Skin: Negative for rash.  Neurological: Positive for headaches. Negative for dizziness.  Hematological: Does not bruise/bleed easily.    Physical Exam Updated Vital Signs BP (!) 149/82    Pulse 78   Temp 97.9 F (36.6 C) (Oral)   Resp 18   SpO2 99%   Physical Exam Vitals and nursing note reviewed.  Constitutional:      General: She is not in acute distress.    Appearance: Normal appearance. She is not ill-appearing or diaphoretic.  HENT:     Head: Normocephalic and atraumatic.     Nose: No congestion or rhinorrhea.     Mouth/Throat:     Mouth: Mucous membranes are moist.     Pharynx: Oropharynx is clear. No oropharyngeal exudate or posterior oropharyngeal erythema.  Eyes:     General: No visual field deficit or scleral icterus.    Conjunctiva/sclera: Conjunctivae normal.     Pupils: Pupils are equal, round, and reactive to light.  Cardiovascular:     Rate and Rhythm: Normal rate and regular rhythm.     Pulses: Normal pulses.     Heart sounds: No murmur heard.  No friction rub. No gallop.   Pulmonary:     Effort: Pulmonary effort is normal. No respiratory distress.     Breath sounds: No wheezing, rhonchi or rales.  Abdominal:     General: There is no distension.     Palpations: Abdomen is soft.     Tenderness: There is no abdominal tenderness. There is no right CVA tenderness, left CVA tenderness or guarding.  Musculoskeletal:        General: Tenderness present. No swelling.     Comments: Patient spine was visualized no gross abnormalities noted.  The entire spine was tender, no crepitus, deformities, or step-off noted.  Patient had 5 of 5 strength,  full range of motion and neurovascular fully intact in all 4 extremities.  Skin:    General: Skin is warm and dry.     Capillary Refill: Capillary refill takes less than 2 seconds.  Findings: No rash.  Neurological:     General: No focal deficit present.     Mental Status: She is alert.     GCS: GCS eye subscore is 4. GCS verbal subscore is 5. GCS motor subscore is 6.     Cranial Nerves: Cranial nerves are intact. No cranial nerve deficit or facial asymmetry.     Sensory: Sensation is intact. No sensory  deficit.     Motor: Motor function is intact. No weakness or pronator drift.     Coordination: Coordination is intact. Romberg sign negative. Finger-Nose-Finger Test and Heel to [facility] Test normal.  Psychiatric:        Mood and Affect: Mood normal.     ED Results / Procedures / Treatments   Labs (all labs ordered are listed, but only abnormal results are displayed) Labs Reviewed  BASIC METABOLIC PANEL - Abnormal; Notable for the following components:      Result Value   Creatinine, Ser 1.20 (*)    GFR, Estimated 49 (*)    All other components within normal limits  CBC WITH DIFFERENTIAL/PLATELET  D-DIMER, QUANTITATIVE (NOT AT [facility])  URINALYSIS, ROUTINE W REFLEX MICROSCOPIC  TROPONIN I (HIGH SENSITIVITY)    EKG None  Radiology CT Head Wo Contrast  Result Date: 04/24/2020 CLINICAL DATA:  Fall EXAM: CT HEAD WITHOUT CONTRAST CT CERVICAL SPINE WITHOUT CONTRAST TECHNIQUE: Multidetector CT imaging of the head and cervical spine was performed following the standard protocol without intravenous contrast. Multiplanar CT image reconstructions of the cervical spine were also generated. COMPARISON:  None. FINDINGS: CT HEAD FINDINGS Brain: There is no mass, hemorrhage or extra-axial collection. The size and configuration of the ventricles and extra-axial CSF spaces are normal. The brain parenchyma is normal, without evidence of acute or chronic infarction. Partially empty sella. Vascular: No abnormal hyperdensity of the major intracranial arteries or dural venous sinuses. No intracranial atherosclerosis. Skull: The visualized skull base, calvarium and extracranial soft tissues are normal. Sinuses/Orbits: No fluid levels or advanced mucosal thickening of the visualized paranasal sinuses. No mastoid or middle ear effusion. The orbits are normal. CT CERVICAL SPINE FINDINGS Alignment: No static subluxation. Facets are aligned. Occipital condyles are normally positioned. Skull base and vertebrae: No acute  fracture. Soft tissues and spinal canal: No prevertebral fluid or swelling. No visible canal hematoma. Disc levels: No advanced spinal canal or neural foraminal stenosis. Upper chest: No pneumothorax, pulmonary nodule or pleural effusion. Other: Normal visualized paraspinal cervical soft tissues. IMPRESSION: 1. No acute intracranial abnormality. 2. No acute fracture or static subluxation of the cervical spine. Electronically Signed   By: Deatra Robinson M.D.   On: 04/24/2020 19:08   CT Cervical Spine Wo Contrast  Result Date: 04/24/2020 CLINICAL DATA:  Fall EXAM: CT HEAD WITHOUT CONTRAST CT CERVICAL SPINE WITHOUT CONTRAST TECHNIQUE: Multidetector CT imaging of the head and cervical spine was performed following the standard protocol without intravenous contrast. Multiplanar CT image reconstructions of the cervical spine were also generated. COMPARISON:  None. FINDINGS: CT HEAD FINDINGS Brain: There is no mass, hemorrhage or extra-axial collection. The size and configuration of the ventricles and extra-axial CSF spaces are normal. The brain parenchyma is normal, without evidence of acute or chronic infarction. Partially empty sella. Vascular: No abnormal hyperdensity of the major intracranial arteries or dural venous sinuses. No intracranial atherosclerosis. Skull: The visualized skull base, calvarium and extracranial soft tissues are normal. Sinuses/Orbits: No fluid levels or advanced mucosal thickening of the visualized paranasal sinuses. No mastoid or middle ear  effusion. The orbits are normal. CT CERVICAL SPINE FINDINGS Alignment: No static subluxation. Facets are aligned. Occipital condyles are normally positioned. Skull base and vertebrae: No acute fracture. Soft tissues and spinal canal: No prevertebral fluid or swelling. No visible canal hematoma. Disc levels: No advanced spinal canal or neural foraminal stenosis. Upper chest: No pneumothorax, pulmonary nodule or pleural effusion. Other: Normal visualized  paraspinal cervical soft tissues. IMPRESSION: 1. No acute intracranial abnormality. 2. No acute fracture or static subluxation of the cervical spine. Electronically Signed   By: Deatra Robinson M.D.   On: 04/24/2020 19:08   CT Thoracic Spine Wo Contrast  Result Date: 04/24/2020 CLINICAL DATA:  Fall EXAM: CT THORACIC AND LUMBAR SPINE WITHOUT CONTRAST TECHNIQUE: Multidetector CT imaging of the thoracic and lumbar spine was performed without contrast. Multiplanar CT image reconstructions were also generated. CONTRAST:  None COMPARISON:  CT lumbar spine 12/08/2018 FINDINGS: CT THORACIC SPINE FINDINGS Alignment: Normal. Vertebrae: No acute fracture or focal pathologic process. Paraspinal and other soft tissues: Negative. Disc levels: No spinal canal stenosis. CT LUMBAR SPINE FINDINGS Segmentation: 5 lumbar type vertebrae. Alignment: Normal. Vertebrae: Vertebral augmentation at L1.  No acute fracture. Paraspinal and other soft tissues: Calcific aortic atherosclerosis. Disc levels: No spinal canal stenosis. IMPRESSION: 1. No acute fracture or static subluxation of the thoracic or lumbar spine. 2. Vertebral augmentation at L1. Aortic Atherosclerosis (ICD10-I70.0). Electronically Signed   By: Deatra Robinson M.D.   On: 04/24/2020 19:03   CT Lumbar Spine Wo Contrast  Result Date: 04/24/2020 CLINICAL DATA:  Fall EXAM: CT THORACIC AND LUMBAR SPINE WITHOUT CONTRAST TECHNIQUE: Multidetector CT imaging of the thoracic and lumbar spine was performed without contrast. Multiplanar CT image reconstructions were also generated. CONTRAST:  None COMPARISON:  CT lumbar spine 12/08/2018 FINDINGS: CT THORACIC SPINE FINDINGS Alignment: Normal. Vertebrae: No acute fracture or focal pathologic process. Paraspinal and other soft tissues: Negative. Disc levels: No spinal canal stenosis. CT LUMBAR SPINE FINDINGS Segmentation: 5 lumbar type vertebrae. Alignment: Normal. Vertebrae: Vertebral augmentation at L1.  No acute fracture. Paraspinal  and other soft tissues: Calcific aortic atherosclerosis. Disc levels: No spinal canal stenosis. IMPRESSION: 1. No acute fracture or static subluxation of the thoracic or lumbar spine. 2. Vertebral augmentation at L1. Aortic Atherosclerosis (ICD10-I70.0). Electronically Signed   By: Deatra Robinson M.D.   On: 04/24/2020 19:03   DG Chest Port 1 View  Result Date: 04/24/2020 CLINICAL DATA:  69 year old female with chest pain. EXAM: PORTABLE CHEST 1 VIEW COMPARISON:  Chest radiograph dated 02/27/2020 and CT dated 05/09/2019. FINDINGS: There is mild eventration of the left hemidiaphragm similar to prior radiograph. No focal consolidation, pleural effusion, or pneumothorax. The cardiac silhouette is within limits. There is mild prominence of the hilar vasculature which may represent mild pulmonary hypertension. No acute osseous pathology. IMPRESSION: No active disease. Electronically Signed   By: Elgie Collard M.D.   On: 04/24/2020 18:08    Procedures Procedures (including critical care time)  Medications Ordered in ED Medications - No data to display  ED Course  I have reviewed the triage vital signs and the nursing notes.  Pertinent labs & imaging results that were available during my care of the patient were reviewed by me and considered in my medical decision making (see chart for details).    MDM Rules/Calculators/A&P                          I have personally reviewed all imaging, labs and  have interpreted them.  Patient presents after having a mechanical fall.  She complains of head and back pain.  She does not appear to be in acute distress, vital signs reassuring.  Will order basic labs and imaging.  CBC negative for leukocytosis or signs of anemia.  BMP negative for electrolyte abnormalities, no metabolic acidosis, creatinine is at baseline, no anion gap noted.  D-dimer 0.27, troponin 3.  CT imaging of head, neck, thoracic, lumbar spine did not reveal any significant findings, chest  x-ray does not reveal any significant findings.  EKG was sinus rhythm without signs of ischemia no ST elevation or depression noted  I have low suspicion for ACS or cardiac abnormality as history is atypical, EKG sinus without signs of ischemia, patient's initial troponin is 3.  Will defer second troponin at this time since chest tightness been going on for 3 weeks.  Low suspicion for PE as patient has low risk factors, patient has negative D-dimer.  Low suspicion for CVA or intracranial head bleed as there is no neuro deficit noted on exam, CT head does not reveal any acute findings.  Low suspicion for fracture or dislocation as imaging does not reveal any acute findings.  Low suspicion for spinal cord abnormality like spinal equina as patient denies urinary retention or incontinency, bladder dysfunction, paresthesias in the lower extremities, no noted weakness in the bilateral extremities.  I suspect patient's pain is skeletal muscular in nature.  I recommend over-the-counter pain medications and following up with PCP for further evaluation management.  Vital signs have remained stable, no indication for hospital admission.  Patient discussed with attending and they agreed with assessment and plan.  Patient given at home care as well strict return precautions.  Patient verbalized that they understood agreed to said plan.     Final Clinical Impression(s) / ED Diagnoses Final diagnoses:  Fall, initial encounter    Rx / DC Orders ED Discharge Orders    None       Barnie Del 04/24/20 2034    Margarita Grizzle, MD 04/24/20 2328

## 2020-04-24 NOTE — ED Notes (Signed)
ED Provider at bedside. 

## 2020-04-24 NOTE — ED Notes (Signed)
An After Visit Summary was printed and given to the patient. Discharge instructions given and no further questions at this time.  Pt able to stand and pivot to sit in wheelchair, wheeled to lobby, pt states someone is coming to pick her up.

## 2020-04-24 NOTE — Discharge Instructions (Signed)
Seen here after a fall.  Lab work and imaging all looks reassuring. I recommend taking over-the-counter pain medications like ibuprofen and/or Tylenol every 6 as needed.  Please follow dosage and on the back of bottle.  I also recommend applying heat to the area and stretching out the muscles as this will help decrease stiffness and pain.   Please follow-up with your PCP for further evaluation management.  Come back to the emergency department if you develop chest pain, shortness of breath, severe abdominal pain, uncontrolled nausea, vomiting, diarrhea.

## 2020-04-24 NOTE — ED Notes (Signed)
New blue top sent down to lab.

## 2020-04-24 NOTE — ED Triage Notes (Signed)
Per EMS- Patient reports that she fell today due to leg pain. Patient states she hit her head on a door frame. patient c/o headache and neck pain. Patient reports "frequent falls and pain all over." Patient states she has not had any pain meds all day today.

## 2020-04-27 ENCOUNTER — Telehealth: Payer: Self-pay

## 2020-04-27 DIAGNOSIS — R296 Repeated falls: Secondary | ICD-10-CM | POA: Diagnosis not present

## 2020-04-27 DIAGNOSIS — G894 Chronic pain syndrome: Secondary | ICD-10-CM | POA: Diagnosis not present

## 2020-04-27 DIAGNOSIS — G629 Polyneuropathy, unspecified: Secondary | ICD-10-CM | POA: Diagnosis not present

## 2020-04-27 DIAGNOSIS — M6281 Muscle weakness (generalized): Secondary | ICD-10-CM | POA: Diagnosis not present

## 2020-04-27 DIAGNOSIS — M5442 Lumbago with sciatica, left side: Secondary | ICD-10-CM | POA: Diagnosis not present

## 2020-04-27 DIAGNOSIS — M5441 Lumbago with sciatica, right side: Secondary | ICD-10-CM | POA: Diagnosis not present

## 2020-04-27 DIAGNOSIS — Z743 Need for continuous supervision: Secondary | ICD-10-CM | POA: Diagnosis not present

## 2020-04-27 DIAGNOSIS — F329 Major depressive disorder, single episode, unspecified: Secondary | ICD-10-CM | POA: Diagnosis not present

## 2020-04-27 DIAGNOSIS — M797 Fibromyalgia: Secondary | ICD-10-CM | POA: Diagnosis not present

## 2020-04-27 NOTE — Telephone Encounter (Signed)
Vicenta Aly with [facility] called and said patient has had 2 falls and went to the ER 04/24/20. She states she is complaining of "pain everywhere" and "left chest and abdominal pain."

## 2020-04-27 NOTE — Telephone Encounter (Signed)
Send to Ed for evaluation.Recommend higher level of care skilled Nursing or Assisted Living due to frequent falls.

## 2020-05-08 ENCOUNTER — Other Ambulatory Visit: Payer: Self-pay | Admitting: *Deleted

## 2020-05-08 MED ORDER — OXYCODONE HCL 15 MG PO TABS: 15.0000 mg | ORAL_TABLET | Freq: Three times a day (TID) | ORAL | 0 refills | Status: DC | PRN

## 2020-05-08 NOTE — Telephone Encounter (Signed)
Patient requested refill Epic LR: 04/09/2020 Contract on [employer] [facility] and sent to [employer] for approval.

## 2020-05-10 ENCOUNTER — Encounter: Payer: Self-pay | Admitting: *Deleted

## 2020-05-10 ENCOUNTER — Telehealth: Payer: Self-pay | Admitting: *Deleted

## 2020-05-10 DIAGNOSIS — M6281 Muscle weakness (generalized): Secondary | ICD-10-CM | POA: Diagnosis not present

## 2020-05-10 DIAGNOSIS — F329 Major depressive disorder, single episode, unspecified: Secondary | ICD-10-CM | POA: Diagnosis not present

## 2020-05-10 DIAGNOSIS — Z743 Need for continuous supervision: Secondary | ICD-10-CM | POA: Diagnosis not present

## 2020-05-10 DIAGNOSIS — M5441 Lumbago with sciatica, right side: Secondary | ICD-10-CM | POA: Diagnosis not present

## 2020-05-10 DIAGNOSIS — G629 Polyneuropathy, unspecified: Secondary | ICD-10-CM | POA: Diagnosis not present

## 2020-05-10 DIAGNOSIS — M5442 Lumbago with sciatica, left side: Secondary | ICD-10-CM | POA: Diagnosis not present

## 2020-05-10 DIAGNOSIS — M797 Fibromyalgia: Secondary | ICD-10-CM | POA: Diagnosis not present

## 2020-05-10 DIAGNOSIS — R296 Repeated falls: Secondary | ICD-10-CM | POA: Diagnosis not present

## 2020-05-10 DIAGNOSIS — G894 Chronic pain syndrome: Secondary | ICD-10-CM | POA: Diagnosis not present

## 2020-05-10 NOTE — Telephone Encounter (Signed)
Betsy with [facility] called requesting verbal orders to Extend PT 2x1, 1x2, 2x2 and for a OT eval.   Verbal orders given.

## 2020-05-10 NOTE — Telephone Encounter (Signed)
Routed letter to [facility] for approval.

## 2020-05-10 NOTE — Telephone Encounter (Signed)
Okay to send letter per patient's request.

## 2020-05-10 NOTE — Telephone Encounter (Signed)
Tried calling patient. No Voicemail. Will try again later.   Letter printed and placed up front for pick up.

## 2020-05-10 NOTE — Telephone Encounter (Signed)
Approved.may send letter.

## 2020-05-10 NOTE — Telephone Encounter (Signed)
Patient called and stated that she has fallen 6 times and has been to the Hospital 3. Patient is wanting her Granddaughter, Melinda [name] to stay with her to help her for about 2 weeks.  Patient lives in Housing at [facility] and they require a Letter from PCP stating that is is necessary for someone to stay with patient.  Patient wants to try this first before deciding to have to go to an assisted living.   Patient needs letter typed and call when ready.

## 2020-05-11 NOTE — Telephone Encounter (Signed)
Tried calling patient. No Voicemail. Will try again later.

## 2020-05-14 NOTE — Telephone Encounter (Signed)
Tried calling patient. No Voicemail

## 2020-05-17 DIAGNOSIS — M6281 Muscle weakness (generalized): Secondary | ICD-10-CM | POA: Diagnosis not present

## 2020-05-17 DIAGNOSIS — F329 Major depressive disorder, single episode, unspecified: Secondary | ICD-10-CM | POA: Diagnosis not present

## 2020-05-17 DIAGNOSIS — M5441 Lumbago with sciatica, right side: Secondary | ICD-10-CM | POA: Diagnosis not present

## 2020-05-17 DIAGNOSIS — M797 Fibromyalgia: Secondary | ICD-10-CM | POA: Diagnosis not present

## 2020-05-17 DIAGNOSIS — R296 Repeated falls: Secondary | ICD-10-CM | POA: Diagnosis not present

## 2020-05-17 DIAGNOSIS — Z743 Need for continuous supervision: Secondary | ICD-10-CM | POA: Diagnosis not present

## 2020-05-17 DIAGNOSIS — M5442 Lumbago with sciatica, left side: Secondary | ICD-10-CM | POA: Diagnosis not present

## 2020-05-17 DIAGNOSIS — G629 Polyneuropathy, unspecified: Secondary | ICD-10-CM | POA: Diagnosis not present

## 2020-05-17 DIAGNOSIS — G894 Chronic pain syndrome: Secondary | ICD-10-CM | POA: Diagnosis not present

## 2020-05-18 ENCOUNTER — Emergency Department (HOSPITAL_COMMUNITY): Payer: Medicare HMO

## 2020-05-18 ENCOUNTER — Emergency Department (HOSPITAL_COMMUNITY)
Admission: EM | Admit: 2020-05-18 | Discharge: 2020-05-18 | Disposition: A | Discharge status: Home / Self Care | Payer: Medicare HMO | Attending: Emergency Medicine | Admitting: Emergency Medicine

## 2020-05-18 ENCOUNTER — Other Ambulatory Visit: Payer: Self-pay

## 2020-05-18 DIAGNOSIS — Z7982 Long term (current) use of aspirin: Secondary | ICD-10-CM | POA: Diagnosis not present

## 2020-05-18 DIAGNOSIS — R079 Chest pain, unspecified: Secondary | ICD-10-CM | POA: Insufficient documentation

## 2020-05-18 DIAGNOSIS — R531 Weakness: Secondary | ICD-10-CM | POA: Diagnosis not present

## 2020-05-18 DIAGNOSIS — J45909 Unspecified asthma, uncomplicated: Secondary | ICD-10-CM | POA: Diagnosis not present

## 2020-05-18 DIAGNOSIS — M25512 Pain in left shoulder: Secondary | ICD-10-CM | POA: Insufficient documentation

## 2020-05-18 DIAGNOSIS — S199XXA Unspecified injury of neck, initial encounter: Secondary | ICD-10-CM | POA: Diagnosis not present

## 2020-05-18 DIAGNOSIS — S7002XA Contusion of left hip, initial encounter: Secondary | ICD-10-CM | POA: Diagnosis not present

## 2020-05-18 DIAGNOSIS — W19XXXA Unspecified fall, initial encounter: Secondary | ICD-10-CM | POA: Diagnosis not present

## 2020-05-18 DIAGNOSIS — Z043 Encounter for examination and observation following other accident: Secondary | ICD-10-CM | POA: Diagnosis not present

## 2020-05-18 DIAGNOSIS — Z79899 Other long term (current) drug therapy: Secondary | ICD-10-CM | POA: Insufficient documentation

## 2020-05-18 DIAGNOSIS — S20212A Contusion of left front wall of thorax, initial encounter: Secondary | ICD-10-CM | POA: Diagnosis not present

## 2020-05-18 DIAGNOSIS — M25572 Pain in left ankle and joints of left foot: Secondary | ICD-10-CM | POA: Diagnosis not present

## 2020-05-18 DIAGNOSIS — I10 Essential (primary) hypertension: Secondary | ICD-10-CM | POA: Insufficient documentation

## 2020-05-18 DIAGNOSIS — Z8616 Personal history of COVID-19: Secondary | ICD-10-CM | POA: Diagnosis not present

## 2020-05-18 DIAGNOSIS — R0902 Hypoxemia: Secondary | ICD-10-CM | POA: Diagnosis not present

## 2020-05-18 DIAGNOSIS — S161XXA Strain of muscle, fascia and tendon at neck level, initial encounter: Secondary | ICD-10-CM | POA: Diagnosis not present

## 2020-05-18 DIAGNOSIS — W050XXA Fall from non-moving wheelchair, initial encounter: Secondary | ICD-10-CM | POA: Diagnosis not present

## 2020-05-18 DIAGNOSIS — R102 Pelvic and perineal pain: Secondary | ICD-10-CM | POA: Diagnosis not present

## 2020-05-18 DIAGNOSIS — M25552 Pain in left hip: Secondary | ICD-10-CM | POA: Diagnosis not present

## 2020-05-18 DIAGNOSIS — M25562 Pain in left knee: Secondary | ICD-10-CM | POA: Insufficient documentation

## 2020-05-18 DIAGNOSIS — R519 Headache, unspecified: Secondary | ICD-10-CM | POA: Diagnosis not present

## 2020-05-18 DIAGNOSIS — R9431 Abnormal electrocardiogram [ECG] [EKG]: Secondary | ICD-10-CM | POA: Diagnosis not present

## 2020-05-18 DIAGNOSIS — M542 Cervicalgia: Secondary | ICD-10-CM | POA: Diagnosis not present

## 2020-05-18 DIAGNOSIS — S40012A Contusion of left shoulder, initial encounter: Secondary | ICD-10-CM | POA: Diagnosis not present

## 2020-05-18 DIAGNOSIS — R52 Pain, unspecified: Secondary | ICD-10-CM | POA: Diagnosis not present

## 2020-05-18 MED ORDER — HYDROMORPHONE HCL 1 MG/ML IJ SOLN
0.5000 mg | Freq: Once | INTRAMUSCULAR | Status: AC
  Administered 2020-05-18: 0.5 mg via INTRAVENOUS
  Filled 2020-05-18: qty 1

## 2020-05-18 NOTE — ED Provider Notes (Signed)
[facility] EMERGENCY DEPARTMENT Provider Note   CSN: [id number] Arrival date & time: 05/18/20  3295     History Chief Complaint  Patient presents with  . Fall    Melinda [name] is a 69 y.o. female.  Patient states that she was getting up out of her wheelchair and she fell onto her left side.  Patient hit her left shoulder left hip and head.  Patient did not have any loss of consciousness.  She complains of pain along her left side  The history is provided by the patient and medical records. No language interpreter was used.  Fall This is a new problem. The current episode started 3 to 5 hours ago. The problem occurs rarely. The problem has been resolved. Associated symptoms include chest pain. Pertinent negatives include no abdominal pain and no headaches. Nothing aggravates the symptoms. Nothing relieves the symptoms. She has tried nothing for the symptoms. The treatment provided no relief.       Past Medical History:  Diagnosis Date  . Acute kidney injury (HCC)   . Allergy   . Anxiety   . Arthritis    "99% of my body" (11/11/2016)  . Asthma   . Bursitis of both hips   . Cataract   . Cervical scoliosis   . Chronic back pain    "all over my back" (11/11/2016)  . Depressive disorder, not elsewhere classified   . Enthesopathy of hip region   . Environmental allergies    "I take Claritin qd; 365 days/year" (11/11/2016)  . Essential hypertension, benign   . Fibromyalgia   . Frequent falls   . GERD (gastroesophageal reflux disease)   . Headache    "at least 1/wk; may last for 2 days or so" (11/11/2016)  . History of blood transfusion 1985   w/hysterectomy  . Lumbar stenosis   . Migraine    "a few/month" (11/11/2016)  . Muscle weakness (generalized)   . Myalgia and myositis, unspecified   . Osteoporosis   . Other abnormal blood chemistry   . Other malaise and fatigue   . Raynaud's syndrome   . Sciatic nerve pain, left   . Sciatica   . Unspecified  vitamin D deficiency     Patient Active Problem List   Diagnosis Date Noted  . Chronic back pain   . Closed compression fracture of first lumbar vertebra (HCC) 11/02/2019  . Anxiety and depression 11/02/2019  . Hypokalemia 11/02/2019  . Dry eyes, bilateral 10/05/2019  . Mixed hyperlipidemia 10/05/2019  . Lower extremity weakness 09/18/2019  . Migraine headache 09/18/2019  . Acute kidney injury (HCC) 05/10/2019  . Near syncope 05/09/2019  . Fall at home, initial encounter 05/09/2019  . History of 2019 novel coronavirus disease (COVID-19) 05/09/2019  . Anemia of chronic disease 05/09/2019  . Acute on chronic renal insufficiency 05/09/2019  . COVID-19 virus infection 10/12/2018  . Polyneuropathy in diseases classified elsewhere (HCC) 10/04/2018  . Prediabetes 09/12/2017  . High risk medication use 09/12/2017  . Frequent falls 09/12/2017  . Chronic pain syndrome 08/14/2017  . Gastroesophageal reflux disease 08/14/2017  . Cough variant asthma 03/14/2017  . Pulmonary hypertension (HCC) 03/12/2017  . Right-sided chest pain 11/11/2016  . Asthma 11/11/2016  . Atypical chest pain   . Costochondritis   . Chronic congestive heart failure (HCC)   . MDD (major depressive disorder), recurrent episode (HCC) 05/23/2015  . Allergic rhinitis 11/03/2014  . Paresthesia 09/08/2014  . Peripheral neuropathy 07/14/2014  . Morbid obesity  due to excess calories (HCC) 07/14/2014  . Pain in limb 07/13/2014  . Abnormality of gait 07/13/2014  . Fibromyalgia 06/13/2014  . Spinal stenosis 01/27/2013  . HTN (hypertension) 11/30/2012    Past Surgical History:  Procedure Laterality Date  . ABDOMINAL HYSTERECTOMY  1985  . CESAREAN SECTION  1976; 1979  . IR KYPHO LUMBAR INC FX REDUCE BONE BX UNI/BIL CANNULATION INC/IMAGING  11/04/2019  . SHOULDER OPEN ROTATOR CUFF REPAIR Left      OB History   No obstetric history on file.     Family History  Problem Relation Age of Onset  . Dementia Mother   .  Heart disease Mother   . Hypertension Mother   . Diabetes Mother   . Diabetes Sister   . Cancer Sister        lung  . Cancer Other        Nepher (Mothers side)  . Diabetes Maternal Grandmother   . Arthritis Maternal Grandmother   . Diabetes Cousin        Mother's side  . Arthritis Sister   . Arthritis Cousin        Mother's side   . Colon cancer Neg Hx   . Esophageal cancer Neg Hx   . Liver cancer Neg Hx   . Pancreatic cancer Neg Hx   . Rectal cancer Neg Hx   . Stomach cancer Neg Hx     Social History   Tobacco Use  . Smoking status: Never Smoker  . Smokeless tobacco: Never Used  Vaping Use  . Vaping Use: Never used  Substance Use Topics  . Alcohol use: No    Alcohol/week: 0.0 standard drinks  . Drug use: No    Home Medications Prior to Admission medications   Medication Sig Start Date End Date Taking? Authorizing Provider  acetaminophen (TYLENOL) 500 MG tablet Take 1,000 mg by mouth every evening.    Yes [provider]  aspirin EC 81 MG tablet Take 81 mg by mouth daily.   Yes [provider]  loratadine (CLARITIN) 10 MG tablet TAKE 1 TABLET BY MOUTH EVERY DAY Patient taking differently: Take 10 mg by mouth daily.  04/09/20  Yes [name], [name] C, NP  amitriptyline (ELAVIL) 150 MG tablet Take 1 tablet (150 mg total) by mouth at bedtime. 02/23/20   [name], [name] C, NP  ascorbic acid (VITAMIN C) 500 MG tablet Take 1 tablet (500 mg total) by mouth 2 (two) times daily. 09/26/19   Margit Hanks, MD  carboxymethylcellulose (REFRESH PLUS) 0.5 % SOLN Place 2 drops into both eyes 2 (two) times daily as needed (dry eyes).     [provider]  citalopram (CELEXA) 20 MG tablet Take 20 mg by mouth daily. 04/07/20   [provider]  citalopram (CELEXA) 40 MG tablet Take 0.5 tablets (20 mg total) by mouth daily. 04/03/20   [name], [name] C, NP  [employer] D3 50 MCG (2000 UT) CAPS TAKE 1 CAPSULE BY MOUTH EVERY DAY 11/08/19   [name], [name] C, NP    fluticasone (FLONASE) 50 MCG/ACT nasal spray Place 2 sprays into both nostrils daily as needed for allergies or rhinitis. 02/23/20   [name], [name] C, NP  furosemide (LASIX) 40 MG tablet Take 1 tablet (40 mg total) by mouth daily. 04/03/20   [name], [name] C, NP  metoprolol tartrate (LOPRESSOR) 50 MG tablet TAKE ONE TABLET BY MOUTH TWICE DAILY FOR BLOOD PRESSURE 02/23/20   [name], [name], NP  oxyCODONE (ROXICODONE)  15 MG immediate release tablet Take 1 tablet (15 mg total) by mouth every 8 (eight) hours as needed for pain. 05/08/20   [name], [name] C, NP  SUMAtriptan (IMITREX) 25 MG tablet TAKE ONE TABLET BY MOUTH EVERY 2 HOURS AS NEEDED FOR MIGRAINE OR HEADACHE. MAY REPEAT IN 2 HOURS IF PERSISTS/RECURS. 04/09/20   [name], [name] C, NP  vitamin B-12 (CYANOCOBALAMIN) 500 MCG tablet Take 1 tablet (500 mcg total) by mouth daily. 09/26/19   Margit Hanks, MD    Allergies    Patient has no known allergies.  Review of Systems   Review of Systems  Constitutional: Negative for appetite change and fatigue.  HENT: Negative for congestion, ear discharge and sinus pressure.        Headache and neck pain  Eyes: Negative for discharge.  Respiratory: Negative for cough.   Cardiovascular: Positive for chest pain.  Gastrointestinal: Negative for abdominal pain and diarrhea.  Genitourinary: Negative for frequency and hematuria.  Musculoskeletal: Negative for back pain.       Patient complains of left shoulder left side of her chest and left hip left knee and left ankle pain  Skin: Negative for rash.  Neurological: Negative for seizures and headaches.  Psychiatric/Behavioral: Negative for hallucinations.    Physical Exam Updated Vital Signs BP 132/78   Pulse 80   Temp 97.8 F (36.6 C)   Resp 13   Ht  (1.626 m)   Wt 99.8 kg   SpO2 99%   BMI 37.76 kg/m   Physical Exam Vitals and nursing note reviewed.  Constitutional:      Appearance: She is well-developed.  HENT:     Head:  Normocephalic.     Comments: Tenderness to posterior neck    Nose: Nose normal.  Eyes:     General: No scleral icterus.    Conjunctiva/sclera: Conjunctivae normal.  Neck:     Thyroid: No thyromegaly.  Cardiovascular:     Rate and Rhythm: Normal rate and regular rhythm.     Heart sounds: No murmur heard.  No friction rub. No gallop.   Pulmonary:     Breath sounds: No stridor. No wheezing or rales.  Chest:     Chest wall: Tenderness present.  Abdominal:     General: There is no distension.     Tenderness: There is no abdominal tenderness. There is no rebound.  Musculoskeletal:     Cervical back: Neck supple.     Comments: Tenderness to left shoulder left chest left hip left knee and left ankle  Lymphadenopathy:     Cervical: No cervical adenopathy.  Skin:    Findings: No erythema or rash.  Neurological:     Mental Status: She is alert and oriented to person, place, and time.     Motor: No abnormal muscle tone.     Coordination: Coordination normal.  Psychiatric:        Behavior: Behavior normal.     ED Results / Procedures / Treatments   Labs (all labs ordered are listed, but only abnormal results are displayed) Labs Reviewed - No data to display  EKG None  Radiology DG Ribs Unilateral W/Chest Left  Result Date: 05/18/2020 CLINICAL DATA:  Left chest pain after falling one month ago. EXAM: LEFT RIBS AND CHEST - 3+ VIEW COMPARISON:  Radiographs 04/24/2020.  CT 05/09/2019. FINDINGS: The heart size and mediastinal contours are stable. There is stable asymmetric elevation of the left hemidiaphragm with associated mildly increased left basilar atelectasis. No  pleural effusion or pneumothorax. No acute left-sided rib fractures are identified. Patient has undergone spinal augmentation at L1. IMPRESSION: No acute findings or evidence of acute left-sided rib fracture. Mildly increased left basilar atelectasis. Electronically Signed   By: Carey Bullocks M.D.   On: 05/18/2020 11:28     DG Ankle Complete Left  Result Date: 05/18/2020 CLINICAL DATA:  Fall. EXAM: LEFT ANKLE COMPLETE - 3+ VIEW COMPARISON:  None. FINDINGS: There is no evidence of fracture, dislocation, or joint effusion. Mild joint space loss at the ankle. Calcaneal enthesophytes. Soft tissues are unremarkable. IMPRESSION: No acute fracture or dislocation. Electronically Signed   By: Feliberto Harts MD   On: 05/18/2020 11:29   CT Head Wo Contrast  Result Date: 05/18/2020 CLINICAL DATA:  Trauma. Fall from wheelchair. Headache and neck pain. EXAM: CT HEAD WITHOUT CONTRAST CT CERVICAL SPINE WITHOUT CONTRAST TECHNIQUE: Multidetector CT imaging of the head and cervical spine was performed following the standard protocol without intravenous contrast. Multiplanar CT image reconstructions of the cervical spine were also generated. COMPARISON:  None. FINDINGS: CT HEAD FINDINGS Brain: No evidence of acute infarction, hemorrhage, hydrocephalus, extra-axial collection or mass lesion/mass effect. Partially empty and expanded sella. Vascular: Calcific atherosclerosis. Skull: No acute fracture. Sinuses/Orbits: Visualized sinuses are clear.  Unremarkable orbits. Other: No mastoid effusions. CT CERVICAL SPINE FINDINGS Alignment: Normal. Skull base and vertebrae: No acute fracture. No primary bone lesion or focal pathologic process. Soft tissues and spinal canal: No prevertebral fluid or swelling. No visible canal hematoma. Disc levels:  No significant focal degenerative change. Upper chest: Ground-glass opacities in the lung apices. Other: None. IMPRESSION: 1. No evidence of acute intracranial abnormality. 2. No evidence of acute fracture or traumatic malalignment in the cervical spine. 3. Ground-glass opacities in the lung apices, which are partially imaged and could relate to expiration. Recommend dedicated chest radiograph to further evaluate. 4. Partially empty and expanded sella. While this finding is frequently incidental, it can be  seen with idiopathic intracranial hypertension in the correct clinical setting. Electronically Signed   By: Feliberto Harts MD   On: 05/18/2020 12:33   CT Cervical Spine Wo Contrast  Result Date: 05/18/2020 CLINICAL DATA:  Trauma. Fall from wheelchair. Headache and neck pain. EXAM: CT HEAD WITHOUT CONTRAST CT CERVICAL SPINE WITHOUT CONTRAST TECHNIQUE: Multidetector CT imaging of the head and cervical spine was performed following the standard protocol without intravenous contrast. Multiplanar CT image reconstructions of the cervical spine were also generated. COMPARISON:  None. FINDINGS: CT HEAD FINDINGS Brain: No evidence of acute infarction, hemorrhage, hydrocephalus, extra-axial collection or mass lesion/mass effect. Partially empty and expanded sella. Vascular: Calcific atherosclerosis. Skull: No acute fracture. Sinuses/Orbits: Visualized sinuses are clear.  Unremarkable orbits. Other: No mastoid effusions. CT CERVICAL SPINE FINDINGS Alignment: Normal. Skull base and vertebrae: No acute fracture. No primary bone lesion or focal pathologic process. Soft tissues and spinal canal: No prevertebral fluid or swelling. No visible canal hematoma. Disc levels:  No significant focal degenerative change. Upper chest: Ground-glass opacities in the lung apices. Other: None. IMPRESSION: 1. No evidence of acute intracranial abnormality. 2. No evidence of acute fracture or traumatic malalignment in the cervical spine. 3. Ground-glass opacities in the lung apices, which are partially imaged and could relate to expiration. Recommend dedicated chest radiograph to further evaluate. 4. Partially empty and expanded sella. While this finding is frequently incidental, it can be seen with idiopathic intracranial hypertension in the correct clinical setting. Electronically Signed   By: Feliberto Harts MD  On: 05/18/2020 12:33   DG Shoulder Left  Result Date: 05/18/2020 CLINICAL DATA:  69 year old female status post fall  with pain. EXAM: LEFT SHOULDER - 2+ VIEW COMPARISON:  Left shoulder series 12/08/2019. Bone mineralization is within normal limits for age. FINDINGS: There is no evidence of fracture or dislocation. There is no evidence of arthropathy or other focal bone abnormality. Mild degenerative spurring at the lower glenoid, left AC joint. Negative visible left ribs and chest. IMPRESSION: No acute osseous abnormality identified about the left shoulder. Electronically Signed   By: Odessa Fleming M.D.   On: 05/18/2020 11:37   DG Knee Complete 4 Views Left  Result Date: 05/18/2020 CLINICAL DATA:  Fall. EXAM: LEFT KNEE - COMPLETE 4+ VIEW COMPARISON:  None. FINDINGS: No evidence of fracture, dislocation, or joint effusion. Mild tricompartmental degenerative change. Superior patellar enthesophyte. Soft tissues are unremarkable. IMPRESSION: No evidence of acute fracture, dislocation, or joint effusion. Electronically Signed   By: Feliberto Harts MD   On: 05/18/2020 11:34   DG Hip Unilat W or Wo Pelvis 2-3 Views Left  Result Date: 05/18/2020 CLINICAL DATA:  69 year old female status post fall. Persistent left pelvic pain, left lower extremity pain. EXAM: DG HIP (WITH OR WITHOUT PELVIS) 2-3V LEFT COMPARISON:  Left hip series 12/08/2019. FINDINGS: Femoral heads remain normally located. Hip joint spaces are stable. Mild acetabular spurring. Bone mineralization is within normal limits for age. Chronic degeneration at the pubic symphysis. SI joints appear within normal limits. No pelvis or proximal left femur fracture. Grossly intact proximal right femur. Negative visible lower abdominal and pelvic visceral contours. IMPRESSION: No acute osseous abnormality identified about the left hip or pelvis. Electronically Signed   By: Odessa Fleming M.D.   On: 05/18/2020 11:35    Procedures Procedures (including critical care time)  Medications Ordered in ED Medications  HYDROmorphone (DILAUDID) injection 0.5 mg (0.5 mg Intravenous Given  05/18/20 1129)    ED Course  I have reviewed the triage vital signs and the nursing notes.  Pertinent labs & imaging results that were available during my care of the patient were reviewed by me and considered in my medical decision making (see chart for details).    MDM Rules/Calculators/A&P                          All x-rays and CT scans negative for acute injury.  Patient will continue her pain medicine and follow-up with her PCP.  She has contusions to her left shoulder chest left hip left knee and left ankle with mild cervical strain Final Clinical Impression(s) / ED Diagnoses Final diagnoses:  Fall, initial encounter    Rx / DC Orders ED Discharge Orders    None       Bethann Berkshire, MD 05/18/20 1308

## 2020-05-18 NOTE — ED Notes (Signed)
Pt able to move all extremities. Pt denies numbness and tingling besides the baseline neuropathy in feet.

## 2020-05-18 NOTE — Discharge Instructions (Addendum)
Take your pain medicine appropriately for your fall.  Rest over the weekend and follow-up with your doctor if any problem

## 2020-05-18 NOTE — ED Notes (Signed)
Patient transported to CT 

## 2020-05-18 NOTE — ED Triage Notes (Signed)
Pt arrives via EMS from home with complaints of a fall. Pt states she was standing up from her wheelchair and her legs gave out. Pt endorses pain on left side from head to toe. Pt also complaining of pelvic pain X1 month.

## 2020-05-21 ENCOUNTER — Telehealth: Payer: Self-pay | Admitting: *Deleted

## 2020-05-21 NOTE — Telephone Encounter (Signed)
Tried calling patient. No Voicemail cannot leave message. Will try again.

## 2020-05-21 NOTE — Telephone Encounter (Signed)
[name], [name], NP  Ryane Canavan, Beckey Downing, [place] Synetta Fail,  [name] was recently seen in the ED again.she has had multiple falls.will need to call and schedule consult visit with Dr.Reed to evaluate due to multiple ED visit secondary to Falls.  Thank You  [name] [name] FNP-C

## 2020-05-22 NOTE — Telephone Encounter (Signed)
Tried calling patient. No Voicemail cannot leave message. Will try again.  

## 2020-05-31 DIAGNOSIS — S2239XA Fracture of one rib, unspecified side, initial encounter for closed fracture: Secondary | ICD-10-CM | POA: Diagnosis not present

## 2020-05-31 DIAGNOSIS — M797 Fibromyalgia: Secondary | ICD-10-CM | POA: Diagnosis not present

## 2020-05-31 DIAGNOSIS — M19012 Primary osteoarthritis, left shoulder: Secondary | ICD-10-CM | POA: Diagnosis not present

## 2020-05-31 DIAGNOSIS — S4992XA Unspecified injury of left shoulder and upper arm, initial encounter: Secondary | ICD-10-CM | POA: Diagnosis not present

## 2020-05-31 DIAGNOSIS — S2232XA Fracture of one rib, left side, initial encounter for closed fracture: Secondary | ICD-10-CM | POA: Diagnosis not present

## 2020-05-31 DIAGNOSIS — J90 Pleural effusion, not elsewhere classified: Secondary | ICD-10-CM | POA: Diagnosis not present

## 2020-05-31 DIAGNOSIS — Z90710 Acquired absence of both cervix and uterus: Secondary | ICD-10-CM | POA: Diagnosis not present

## 2020-05-31 DIAGNOSIS — Y9389 Activity, other specified: Secondary | ICD-10-CM | POA: Diagnosis not present

## 2020-05-31 DIAGNOSIS — Y9289 Other specified places as the place of occurrence of the external cause: Secondary | ICD-10-CM | POA: Diagnosis not present

## 2020-05-31 DIAGNOSIS — M25552 Pain in left hip: Secondary | ICD-10-CM | POA: Diagnosis not present

## 2020-05-31 DIAGNOSIS — S2249XA Multiple fractures of ribs, unspecified side, initial encounter for closed fracture: Secondary | ICD-10-CM | POA: Diagnosis not present

## 2020-05-31 DIAGNOSIS — W010XXA Fall on same level from slipping, tripping and stumbling without subsequent striking against object, initial encounter: Secondary | ICD-10-CM | POA: Diagnosis not present

## 2020-05-31 DIAGNOSIS — R9389 Abnormal findings on diagnostic imaging of other specified body structures: Secondary | ICD-10-CM | POA: Diagnosis not present

## 2020-05-31 DIAGNOSIS — M1990 Unspecified osteoarthritis, unspecified site: Secondary | ICD-10-CM | POA: Diagnosis not present

## 2020-05-31 DIAGNOSIS — R102 Pelvic and perineal pain: Secondary | ICD-10-CM | POA: Diagnosis not present

## 2020-05-31 NOTE — Telephone Encounter (Signed)
Left message on voicemail for patient to return call when available, I left a detailed message informing patient she needs to schedule a follow-up appointment with Dr.Reed for evaluation of high fall risk.  This was the third attempt to reach patient. I will mail a letter and close encounter.

## 2020-06-06 ENCOUNTER — Other Ambulatory Visit: Payer: Self-pay | Admitting: *Deleted

## 2020-06-06 MED ORDER — OXYCODONE HCL 15 MG PO TABS: 15.0000 mg | ORAL_TABLET | Freq: Three times a day (TID) | ORAL | 0 refills | Status: DC | PRN

## 2020-06-06 NOTE — Telephone Encounter (Signed)
Patient requested refill Epic LR: 05/08/2020 Contract on File.   Scheduled patient a Follow up from ER due to Falls for 12/14. Patient could not come in sooner because she is in [place].   Pended Rx and sent to [facility] for approval.

## 2020-06-11 ENCOUNTER — Telehealth: Payer: Self-pay | Admitting: *Deleted

## 2020-06-11 NOTE — Telephone Encounter (Signed)
Patient called and stated that she has an appointment scheduled for tomorrow 06/12/2020. Stated that her car has broke down in [place] and she will not be back home and not sure when she will be.  Patient is wanting to know if she can do a TeleVisit instead, if not she will reschedule her appointment at a later time once her car is fixed.   Please Advise.

## 2020-06-12 ENCOUNTER — Ambulatory Visit: Payer: Medicare HMO | Admitting: Family

## 2020-06-12 NOTE — Telephone Encounter (Signed)
[name], [name] C, NP  You 15 hours ago (5:05 PM)     Routine visit need to be in office   Routing comment        LMOM to return call.

## 2020-06-27 ENCOUNTER — Other Ambulatory Visit: Payer: Self-pay | Admitting: Family

## 2020-06-27 DIAGNOSIS — G43009 Migraine without aura, not intractable, without status migrainosus: Secondary | ICD-10-CM

## 2020-06-28 ENCOUNTER — Other Ambulatory Visit: Payer: Self-pay | Admitting: *Deleted

## 2020-06-28 NOTE — Telephone Encounter (Signed)
High risk or very high risk warning populated when attempting to refill medication. RX request sent to PCP for review and approval if warranted.   

## 2020-07-03 ENCOUNTER — Other Ambulatory Visit: Payer: Self-pay | Admitting: Family

## 2020-07-03 DIAGNOSIS — G43009 Migraine without aura, not intractable, without status migrainosus: Secondary | ICD-10-CM

## 2020-07-03 NOTE — Telephone Encounter (Signed)
Patient is requesting refill on medication "Sumatriptan 25 mg". Patient medication last refilled 06/28/2020 with 9 tablets to be taken every 2 hours as needed for migraine. Medication pend and sent to PCP [name], [name], NP . Please Advise.

## 2020-07-09 ENCOUNTER — Other Ambulatory Visit: Payer: Self-pay | Admitting: Family

## 2020-07-09 ENCOUNTER — Telehealth: Payer: Self-pay | Admitting: *Deleted

## 2020-07-09 DIAGNOSIS — J302 Other seasonal allergic rhinitis: Secondary | ICD-10-CM

## 2020-07-09 DIAGNOSIS — I10 Essential (primary) hypertension: Secondary | ICD-10-CM

## 2020-07-09 NOTE — Telephone Encounter (Signed)
Patient called and stated that she is out of her pain medication, Oxycodone, and requesting a refill. Stated that she is still in [place] but should be returning in a couple of days. Stated that she knows she needs to make an appointment but has not been able to because of being in [place]. Stated that once she gets back she will schedule an appointment and is requesting her medication to be filled.  Please Advise.

## 2020-07-09 NOTE — Telephone Encounter (Signed)
LMOM for patient to return call to make aware before refilling Rx.

## 2020-07-09 NOTE — Telephone Encounter (Signed)
Will refill pain medication this month but will need a visit with prior to next refill.

## 2020-07-10 NOTE — Telephone Encounter (Signed)
LMOM for patient to return call to make aware of [name]'s response before refilling Rx.

## 2020-07-11 ENCOUNTER — Telehealth: Payer: Self-pay

## 2020-07-11 ENCOUNTER — Other Ambulatory Visit: Payer: Self-pay

## 2020-07-11 DIAGNOSIS — M5441 Lumbago with sciatica, right side: Secondary | ICD-10-CM | POA: Diagnosis not present

## 2020-07-11 DIAGNOSIS — Z743 Need for continuous supervision: Secondary | ICD-10-CM | POA: Diagnosis not present

## 2020-07-11 DIAGNOSIS — M797 Fibromyalgia: Secondary | ICD-10-CM | POA: Diagnosis not present

## 2020-07-11 DIAGNOSIS — G629 Polyneuropathy, unspecified: Secondary | ICD-10-CM | POA: Diagnosis not present

## 2020-07-11 DIAGNOSIS — G894 Chronic pain syndrome: Secondary | ICD-10-CM | POA: Diagnosis not present

## 2020-07-11 DIAGNOSIS — M5442 Lumbago with sciatica, left side: Secondary | ICD-10-CM | POA: Diagnosis not present

## 2020-07-11 DIAGNOSIS — F329 Major depressive disorder, single episode, unspecified: Secondary | ICD-10-CM | POA: Diagnosis not present

## 2020-07-11 DIAGNOSIS — M6281 Muscle weakness (generalized): Secondary | ICD-10-CM | POA: Diagnosis not present

## 2020-07-11 DIAGNOSIS — R296 Repeated falls: Secondary | ICD-10-CM | POA: Diagnosis not present

## 2020-07-11 MED ORDER — OXYCODONE HCL 15 MG PO TABS: 15.0000 mg | ORAL_TABLET | Freq: Three times a day (TID) | ORAL | 0 refills | Status: DC | PRN

## 2020-07-11 NOTE — Telephone Encounter (Signed)
Called patient and left message for her to call office before refilling medication. Need patient to call office before refilling medication according to [facility] May, CMA previous phone note.

## 2020-07-11 NOTE — Telephone Encounter (Signed)
Patient called and appointment and would like medication refilled. She is now back in [place]

## 2020-07-11 NOTE — Telephone Encounter (Signed)
Betsy,physical therapist,with [facility] called wanting verbal orders for PT 2 times a week for 5 weeks then once a week for 3 weeks. She also wanted to inform you that patient lives with her son and has had 2 falls with no reported injuries. Please advise.

## 2020-07-11 NOTE — Telephone Encounter (Signed)
May give verbal orders per PT

## 2020-07-12 NOTE — Telephone Encounter (Signed)
Spoke with Tamela Oddi and let her know that per [name] [name],NP verbal orders for PT are ok.

## 2020-07-13 NOTE — Telephone Encounter (Signed)
LMOM for patient to return call to make aware of [name]'s response before refilling Rx.  

## 2020-07-19 DIAGNOSIS — M5441 Lumbago with sciatica, right side: Secondary | ICD-10-CM | POA: Diagnosis not present

## 2020-07-19 DIAGNOSIS — M797 Fibromyalgia: Secondary | ICD-10-CM | POA: Diagnosis not present

## 2020-07-19 DIAGNOSIS — R296 Repeated falls: Secondary | ICD-10-CM | POA: Diagnosis not present

## 2020-07-19 DIAGNOSIS — G894 Chronic pain syndrome: Secondary | ICD-10-CM | POA: Diagnosis not present

## 2020-07-19 DIAGNOSIS — M5442 Lumbago with sciatica, left side: Secondary | ICD-10-CM | POA: Diagnosis not present

## 2020-07-19 DIAGNOSIS — M6281 Muscle weakness (generalized): Secondary | ICD-10-CM | POA: Diagnosis not present

## 2020-07-19 DIAGNOSIS — F329 Major depressive disorder, single episode, unspecified: Secondary | ICD-10-CM | POA: Diagnosis not present

## 2020-07-19 DIAGNOSIS — G629 Polyneuropathy, unspecified: Secondary | ICD-10-CM | POA: Diagnosis not present

## 2020-07-19 DIAGNOSIS — Z743 Need for continuous supervision: Secondary | ICD-10-CM | POA: Diagnosis not present

## 2020-07-23 ENCOUNTER — Ambulatory Visit: Payer: Medicare HMO | Admitting: Family

## 2020-07-24 DIAGNOSIS — M797 Fibromyalgia: Secondary | ICD-10-CM | POA: Diagnosis not present

## 2020-07-24 DIAGNOSIS — Z743 Need for continuous supervision: Secondary | ICD-10-CM | POA: Diagnosis not present

## 2020-07-24 DIAGNOSIS — M5441 Lumbago with sciatica, right side: Secondary | ICD-10-CM | POA: Diagnosis not present

## 2020-07-24 DIAGNOSIS — G894 Chronic pain syndrome: Secondary | ICD-10-CM | POA: Diagnosis not present

## 2020-07-24 DIAGNOSIS — M6281 Muscle weakness (generalized): Secondary | ICD-10-CM | POA: Diagnosis not present

## 2020-07-24 DIAGNOSIS — F329 Major depressive disorder, single episode, unspecified: Secondary | ICD-10-CM | POA: Diagnosis not present

## 2020-07-24 DIAGNOSIS — G629 Polyneuropathy, unspecified: Secondary | ICD-10-CM | POA: Diagnosis not present

## 2020-07-24 DIAGNOSIS — M5442 Lumbago with sciatica, left side: Secondary | ICD-10-CM | POA: Diagnosis not present

## 2020-07-24 DIAGNOSIS — R296 Repeated falls: Secondary | ICD-10-CM | POA: Diagnosis not present

## 2020-07-29 ENCOUNTER — Emergency Department (HOSPITAL_COMMUNITY): Payer: Medicare HMO

## 2020-07-29 ENCOUNTER — Emergency Department (HOSPITAL_COMMUNITY)
Admission: EM | Admit: 2020-07-29 | Discharge: 2020-07-29 | Disposition: A | Discharge status: Home / Self Care | Payer: Medicare HMO | Attending: Emergency Medicine | Admitting: Emergency Medicine

## 2020-07-29 ENCOUNTER — Other Ambulatory Visit: Payer: Self-pay

## 2020-07-29 DIAGNOSIS — Z79899 Other long term (current) drug therapy: Secondary | ICD-10-CM | POA: Diagnosis not present

## 2020-07-29 DIAGNOSIS — I509 Heart failure, unspecified: Secondary | ICD-10-CM | POA: Insufficient documentation

## 2020-07-29 DIAGNOSIS — Z8616 Personal history of COVID-19: Secondary | ICD-10-CM | POA: Diagnosis not present

## 2020-07-29 DIAGNOSIS — I110 Hypertensive heart disease with heart failure: Secondary | ICD-10-CM | POA: Insufficient documentation

## 2020-07-29 DIAGNOSIS — E876 Hypokalemia: Secondary | ICD-10-CM | POA: Diagnosis not present

## 2020-07-29 DIAGNOSIS — R519 Headache, unspecified: Secondary | ICD-10-CM | POA: Insufficient documentation

## 2020-07-29 DIAGNOSIS — W01198A Fall on same level from slipping, tripping and stumbling with subsequent striking against other object, initial encounter: Secondary | ICD-10-CM | POA: Insufficient documentation

## 2020-07-29 DIAGNOSIS — I10 Essential (primary) hypertension: Secondary | ICD-10-CM | POA: Diagnosis not present

## 2020-07-29 DIAGNOSIS — W19XXXA Unspecified fall, initial encounter: Secondary | ICD-10-CM | POA: Diagnosis not present

## 2020-07-29 DIAGNOSIS — J45991 Cough variant asthma: Secondary | ICD-10-CM | POA: Insufficient documentation

## 2020-07-29 DIAGNOSIS — R531 Weakness: Secondary | ICD-10-CM | POA: Diagnosis not present

## 2020-07-29 DIAGNOSIS — Z7982 Long term (current) use of aspirin: Secondary | ICD-10-CM | POA: Diagnosis not present

## 2020-07-29 DIAGNOSIS — M542 Cervicalgia: Secondary | ICD-10-CM | POA: Diagnosis not present

## 2020-07-29 DIAGNOSIS — R000 Tachycardia, unspecified: Secondary | ICD-10-CM | POA: Diagnosis not present

## 2020-07-29 LAB — BASIC METABOLIC PANEL
Anion gap: 15 (ref 5–15)
BUN: 5 mg/dL — ABNORMAL LOW (ref 8–23)
CO2: 26 mmol/L (ref 22–32)
Calcium: 9.3 mg/dL (ref 8.9–10.3)
Chloride: 96 mmol/L — ABNORMAL LOW (ref 98–111)
Creatinine, Ser: 0.99 mg/dL (ref 0.44–1.00)
GFR, Estimated: >60 mL/min (ref >60)
Glucose, Bld: 95 mg/dL (ref 70–99)
Potassium: 2.4 mmol/L — CL (ref 3.5–5.1)
Sodium: 137 mmol/L (ref 135–145)

## 2020-07-29 LAB — CBC
HCT: 40.2 % (ref 36.0–46.0)
Hemoglobin: 12.5 g/dL (ref 12.0–15.0)
MCH: 26.7 pg (ref 26.0–34.0)
MCHC: 31.1 g/dL (ref 30.0–36.0)
MCV: 85.9 fL (ref 80.0–100.0)
Platelets: 393 10*3/uL (ref 150–400)
RBC: 4.68 MIL/uL (ref 3.87–5.11)
RDW: 14.3 % (ref 11.5–15.5)
WBC: 9.6 10*3/uL (ref 4.0–10.5)
nRBC: 0 % (ref 0.0–0.2)

## 2020-07-29 MED ORDER — POTASSIUM CHLORIDE CRYS ER 20 MEQ PO TBCR
80.0000 meq | EXTENDED_RELEASE_TABLET | Freq: Once | ORAL | Status: AC
  Administered 2020-07-29: 80 meq via ORAL
  Filled 2020-07-29: qty 4

## 2020-07-29 NOTE — Discharge Instructions (Addendum)
As discussed, is very important that you follow-up with your physicians this week for repeat blood draw to ensure that your potassium levels have normalized, and to continue ongoing evaluation of your frequent falls.  Return here for concerning changes in your condition.

## 2020-07-29 NOTE — ED Provider Notes (Signed)
[facility] EMERGENCY DEPARTMENT Provider Note   CSN: [id number] Arrival date & time: 07/29/20  1954     History Chief Complaint  Patient presents with  . Weakness    Melinda [name] is a 70 y.o. female.  HPI Patient with multiple medical issues including fibromyalgia, chronic pain, on oxycodone presents in the context of ongoing falls and weakness, now after fall that occurred 2 days ago. Patient notes that she fell backwards, struck her head, neck, did not lose consciousness, but has had soreness since that time. No new weakness, though she does have baseline weakness and slight asymmetry in her lower extremities that is unchanged Patient had COVID about 1 year ago, has received 1 dose of vaccine since that time. No nausea, vomiting, fever, chills or other complaints.     Past Medical History:  Diagnosis Date  . Acute kidney injury (HCC)   . Allergy   . Anxiety   . Arthritis    "99% of my body" (11/11/2016)  . Asthma   . Bursitis of both hips   . Cataract   . Cervical scoliosis   . Chronic back pain    "all over my back" (11/11/2016)  . Depressive disorder, not elsewhere classified   . Enthesopathy of hip region   . Environmental allergies    "I take Claritin qd; 365 days/year" (11/11/2016)  . Essential hypertension, benign   . Fibromyalgia   . Frequent falls   . GERD (gastroesophageal reflux disease)   . Headache    "at least 1/wk; may last for 2 days or so" (11/11/2016)  . History of blood transfusion 1985   w/hysterectomy  . Lumbar stenosis   . Migraine    "a few/month" (11/11/2016)  . Muscle weakness (generalized)   . Myalgia and myositis, unspecified   . Osteoporosis   . Other abnormal blood chemistry   . Other malaise and fatigue   . Raynaud's syndrome   . Sciatic nerve pain, left   . Sciatica   . Unspecified vitamin D deficiency     Patient Active Problem List   Diagnosis Date Noted  . Chronic back pain   . Closed compression  fracture of first lumbar vertebra (HCC) 11/02/2019  . Anxiety and depression 11/02/2019  . Hypokalemia 11/02/2019  . Dry eyes, bilateral 10/05/2019  . Mixed hyperlipidemia 10/05/2019  . Lower extremity weakness 09/18/2019  . Migraine headache 09/18/2019  . Acute kidney injury (HCC) 05/10/2019  . Near syncope 05/09/2019  . Fall at home, initial encounter 05/09/2019  . History of 2019 novel coronavirus disease (COVID-19) 05/09/2019  . Anemia of chronic disease 05/09/2019  . Acute on chronic renal insufficiency 05/09/2019  . COVID-19 virus infection 10/12/2018  . Polyneuropathy in diseases classified elsewhere (HCC) 10/04/2018  . Prediabetes 09/12/2017  . High risk medication use 09/12/2017  . Frequent falls 09/12/2017  . Chronic pain syndrome 08/14/2017  . Gastroesophageal reflux disease 08/14/2017  . Cough variant asthma 03/14/2017  . Pulmonary hypertension (HCC) 03/12/2017  . Right-sided chest pain 11/11/2016  . Asthma 11/11/2016  . Atypical chest pain   . Costochondritis   . Chronic congestive heart failure (HCC)   . MDD (major depressive disorder), recurrent episode (HCC) 05/23/2015  . Allergic rhinitis 11/03/2014  . Paresthesia 09/08/2014  . Peripheral neuropathy 07/14/2014  . Morbid obesity due to excess calories (HCC) 07/14/2014  . Pain in limb 07/13/2014  . Abnormality of gait 07/13/2014  . Fibromyalgia 06/13/2014  . Spinal stenosis 01/27/2013  . HTN (  hypertension) 11/30/2012    Past Surgical History:  Procedure Laterality Date  . ABDOMINAL HYSTERECTOMY  1985  . CESAREAN SECTION  1976; 1979  . IR KYPHO LUMBAR INC FX REDUCE BONE BX UNI/BIL CANNULATION INC/IMAGING  11/04/2019  . SHOULDER OPEN ROTATOR CUFF REPAIR Left      OB History   No obstetric history on file.     Family History  Problem Relation Age of Onset  . Dementia Mother   . Heart disease Mother   . Hypertension Mother   . Diabetes Mother   . Diabetes Sister   . Cancer Sister        lung  .  Cancer Other        Nepher (Mothers side)  . Diabetes Maternal Grandmother   . Arthritis Maternal Grandmother   . Diabetes Cousin        Mother's side  . Arthritis Sister   . Arthritis Cousin        Mother's side   . Colon cancer Neg Hx   . Esophageal cancer Neg Hx   . Liver cancer Neg Hx   . Pancreatic cancer Neg Hx   . Rectal cancer Neg Hx   . Stomach cancer Neg Hx     Social History   Tobacco Use  . Smoking status: Never Smoker  . Smokeless tobacco: Never Used  Vaping Use  . Vaping Use: Never used  Substance Use Topics  . Alcohol use: No    Alcohol/week: 0.0 standard drinks  . Drug use: No    Home Medications Prior to Admission medications   Medication Sig Start Date End Date Taking? Authorizing Provider  acetaminophen (TYLENOL) 500 MG tablet Take 1,000 mg by mouth every evening.     [provider]  amitriptyline (ELAVIL) 150 MG tablet Take 1 tablet (150 mg total) by mouth at bedtime. 02/23/20   [name], [name] C, NP  ascorbic acid (VITAMIN C) 500 MG tablet Take 1 tablet (500 mg total) by mouth 2 (two) times daily. 09/26/19   Margit Hanks, MD  aspirin EC 81 MG tablet Take 81 mg by mouth daily.    [provider]  carboxymethylcellulose (REFRESH PLUS) 0.5 % SOLN Place 2 drops into both eyes 2 (two) times daily as needed (dry eyes).     [provider]  citalopram (CELEXA) 20 MG tablet TAKE 1 TABLET BY MOUTH EVERY DAY 06/28/20   [name], [name] C, NP  citalopram (CELEXA) 40 MG tablet Take 0.5 tablets (20 mg total) by mouth daily. 04/03/20   [name], [name] C, NP  [employer] D3 50 MCG (2000 UT) CAPS TAKE 1 CAPSULE BY MOUTH EVERY DAY 11/08/19   [name], [name] C, NP  fluticasone (FLONASE) 50 MCG/ACT nasal spray Place 2 sprays into both nostrils daily as needed for allergies or rhinitis. 02/23/20   [name], [name] C, NP  furosemide (LASIX) 40 MG tablet Take 1 tablet (40 mg total) by mouth daily. 04/03/20   [name], [name] C, NP  loratadine (CLARITIN) 10 MG  tablet TAKE 1 TABLET BY MOUTH EVERY DAY Patient taking differently: Take 10 mg by mouth daily.  04/09/20   [name], [name] C, NP  metoprolol tartrate (LOPRESSOR) 50 MG tablet TAKE ONE TABLET BY MOUTH TWICE DAILY FOR BLOOD PRESSURE 02/23/20   [name], [name] C, NP  oxyCODONE (ROXICODONE) 15 MG immediate release tablet Take 1 tablet (15 mg total) by mouth every 8 (eight) hours as needed for pain. 07/11/20   [name], [name], NP  SUMAtriptan (IMITREX) 25 MG tablet TAKE ONE TABLET BY MOUTH ONCE FOR HEADACHE. MAY REPEAT IN 2 HOURS IF HEADACHE PERSISTS OR RECURS. 07/03/20   [name], [name] C, NP  vitamin B-12 (CYANOCOBALAMIN) 500 MCG tablet Take 1 tablet (500 mcg total) by mouth daily. 09/26/19   Margit Hanks, MD    Allergies    Patient has no known allergies.  Review of Systems   Review of Systems  Constitutional:       Per HPI, otherwise negative  HENT:       Per HPI, otherwise negative  Respiratory:       Per HPI, otherwise negative  Cardiovascular:       Per HPI, otherwise negative  Gastrointestinal: Negative for vomiting.  Endocrine:       Negative aside from HPI  Genitourinary:       Neg aside from HPI   Musculoskeletal:       Per HPI, otherwise negative  Skin: Negative.   Neurological: Positive for weakness. Negative for syncope.    Physical Exam Updated Vital Signs BP 134/78 (BP Location: Right Arm)   Pulse 97   Temp 98.5 F (36.9 C) (Oral)   Resp 16   SpO2 96%   Physical Exam Vitals and nursing note reviewed.  Constitutional:      General: She is not in acute distress.    Appearance: She is well-developed and well-nourished.  HENT:     Head: Normocephalic and atraumatic.  Eyes:     Extraocular Movements: EOM normal.     Conjunctiva/sclera: Conjunctivae normal.  Neck:     Comments: Collar removed after CT resulted - no pain or complaints, no deformity, no limit of ROM Cardiovascular:     Rate and Rhythm: Normal rate and regular rhythm.  Pulmonary:     Effort:  Pulmonary effort is normal. No respiratory distress.     Breath sounds: Normal breath sounds. No stridor.  Abdominal:     General: There is no distension.  Musculoskeletal:        General: No edema.     Cervical back: Normal range of motion and neck supple. No rigidity.  Skin:    General: Skin is warm and dry.  Neurological:     Mental Status: She is alert and oriented to person, place, and time.     Cranial Nerves: No cranial nerve deficit.     Comments: Trace LE asym w L LE 4.5/5 vs R LE 4/5 - patient denies changes  Psychiatric:        Mood and Affect: Mood and affect and mood normal.        Behavior: Behavior normal.     ED Results / Procedures / Treatments   Labs (all labs ordered are listed, but only abnormal results are displayed) Labs Reviewed  BASIC METABOLIC PANEL - Abnormal; Notable for the following components:      Result Value   Potassium 2.4 (*)    Chloride 96 (*)    BUN 5 (*)    All other components within normal limits  CBC  URINALYSIS, ROUTINE W REFLEX MICROSCOPIC  CBG MONITORING, ED    EMS rhythm strip/EKG reviewed, rate 93, sinus rhythm, substantial artifact, but not overtly ischemic  EKG EKG Interpretation  Date/Time:  Sunday July 29 2020 20:03:23 EST Ventricular Rate:  95 PR Interval:    QRS Duration: 100 QT Interval:  300 QTC Calculation: 377 R Axis:   42 Text Interpretation: Sinus rhythm Septal infarct , age  undetermined ST-t wave abnormality Abnormal ECG Confirmed by Gerhard Munch [phone]) on 07/29/2020 10:07:07 PM   Radiology CT HEAD WO CONTRAST  Result Date: 07/29/2020 CLINICAL DATA:  Recent fall with headaches and facial pain, initial encounter EXAM: CT HEAD WITHOUT CONTRAST CT CERVICAL SPINE WITHOUT CONTRAST TECHNIQUE: Multidetector CT imaging of the head and cervical spine was performed following the standard protocol without intravenous contrast. Multiplanar CT image reconstructions of the cervical spine were also generated.  COMPARISON:  05/18/2020 FINDINGS: CT HEAD FINDINGS Brain: No evidence of acute infarction, hemorrhage, hydrocephalus, extra-axial collection or mass lesion/mass effect. Vascular: No hyperdense vessel or unexpected calcification. Skull: Normal. Negative for fracture or focal lesion. Sinuses/Orbits: No acute finding. Other: None. CT CERVICAL SPINE FINDINGS Alignment: Within normal limits. Skull base and vertebrae: 7 cervical segments are well visualized. Vertebral body height is well maintained. No acute fracture or acute facet abnormality is noted. Soft tissues and spinal canal: Surrounding soft tissue structures show no acute abnormality. Upper chest: Visualized lung apices are within normal limits. Other: None IMPRESSION: CT of the head: No acute intracranial abnormality noted. CT of the cervical spine: No acute abnormality is seen. Electronically Signed   By: Alcide Clever M.D.   On: 07/29/2020 21:08   CT Cervical Spine Wo Contrast  Result Date: 07/29/2020 CLINICAL DATA:  Recent fall with headaches and facial pain, initial encounter EXAM: CT HEAD WITHOUT CONTRAST CT CERVICAL SPINE WITHOUT CONTRAST TECHNIQUE: Multidetector CT imaging of the head and cervical spine was performed following the standard protocol without intravenous contrast. Multiplanar CT image reconstructions of the cervical spine were also generated. COMPARISON:  05/18/2020 FINDINGS: CT HEAD FINDINGS Brain: No evidence of acute infarction, hemorrhage, hydrocephalus, extra-axial collection or mass lesion/mass effect. Vascular: No hyperdense vessel or unexpected calcification. Skull: Normal. Negative for fracture or focal lesion. Sinuses/Orbits: No acute finding. Other: None. CT CERVICAL SPINE FINDINGS Alignment: Within normal limits. Skull base and vertebrae: 7 cervical segments are well visualized. Vertebral body height is well maintained. No acute fracture or acute facet abnormality is noted. Soft tissues and spinal canal: Surrounding soft  tissue structures show no acute abnormality. Upper chest: Visualized lung apices are within normal limits. Other: None IMPRESSION: CT of the head: No acute intracranial abnormality noted. CT of the cervical spine: No acute abnormality is seen. Electronically Signed   By: Alcide Clever M.D.   On: 07/29/2020 21:08    Procedures Procedures   Medications Ordered in ED Medications  potassium chloride SA (KLOR-CON) CR tablet 80 mEq (has no administration in time range)    ED Course  I have reviewed the triage vital signs and the nursing notes.  Pertinent labs & imaging results that were available during my care of the patient were reviewed by me and considered in my medical decision making (see chart for details).  Adult female presents in the context of ongoing falls for which she has been seen and evaluated multiple times, over the past year. She had a fall 2 days ago, and since that time has had mild pain in her head, neck, as well as ongoing weakness that is described as essentially unchanged.  When here she is awake, alert, states that she feels well.  Head CT, neck CT reviewed, discussed, no notable findings. Patient labs are abnormal with notable hypokalemia.  Patient appropriate for oral repletion, amenable to close outpatient follow-up with repeat lab check in the coming days as well as ongoing evaluation for her falls. Without evidence for new weakness, fractures, CNS  pathology, substantial EKG abnormalities, patient is appropriate for discharge with outpatient follow-up. Final Clinical Impression(s) / ED Diagnoses Final diagnoses:  Fall, initial encounter  Hypokalemia     Gerhard Munch, MD 07/29/20 [id number]

## 2020-07-29 NOTE — ED Triage Notes (Signed)
Pt presents to ED BIB [facility]. Pt c/o back of head and neck pain. Pt had mechanical fall and increased weakness x2d.  EMS VSS

## 2020-07-29 NOTE — ED Notes (Signed)
Reviewed discharge instructions with patient. Follow-up care reviewed. Patient verbalized understanding. Patient A&Ox4, VSS upon discharge.  

## 2020-07-31 ENCOUNTER — Telehealth: Payer: Self-pay

## 2020-07-31 DIAGNOSIS — F329 Major depressive disorder, single episode, unspecified: Secondary | ICD-10-CM | POA: Diagnosis not present

## 2020-07-31 DIAGNOSIS — R296 Repeated falls: Secondary | ICD-10-CM | POA: Diagnosis not present

## 2020-07-31 DIAGNOSIS — G894 Chronic pain syndrome: Secondary | ICD-10-CM | POA: Diagnosis not present

## 2020-07-31 DIAGNOSIS — M5441 Lumbago with sciatica, right side: Secondary | ICD-10-CM | POA: Diagnosis not present

## 2020-07-31 DIAGNOSIS — Z743 Need for continuous supervision: Secondary | ICD-10-CM | POA: Diagnosis not present

## 2020-07-31 DIAGNOSIS — M6281 Muscle weakness (generalized): Secondary | ICD-10-CM | POA: Diagnosis not present

## 2020-07-31 DIAGNOSIS — G629 Polyneuropathy, unspecified: Secondary | ICD-10-CM | POA: Diagnosis not present

## 2020-07-31 DIAGNOSIS — M797 Fibromyalgia: Secondary | ICD-10-CM | POA: Diagnosis not present

## 2020-07-31 DIAGNOSIS — M5442 Lumbago with sciatica, left side: Secondary | ICD-10-CM | POA: Diagnosis not present

## 2020-07-31 NOTE — Telephone Encounter (Signed)
Noted  

## 2020-07-31 NOTE — Telephone Encounter (Signed)
Incoming call received from [place] with [facility]. Patient experienced a fall on 07/30/2020 and was taken to the Emergency Room.  Patient is back at home and Tamela Oddi is concerned for patients safety with her high fall risk, physical therapy has been unsuccessful in preventing falls. Betsy recommended patient call for an earlier appointment to be evaluated for pain and frequent falls.  Side note: as of now patient has not called to schedule an earlier appointment.    Call placed to patient, left message on voicemail for patient to return call when available. Reason for call: Offer patient an earlier acute appointment.

## 2020-07-31 NOTE — Telephone Encounter (Signed)
Patient called and LM on Clinical Intake stating that she was calling to make an appointment. Stated that the Therapist told her to call to schedule an appointment to be seen.   Tried calling patient back. LMOM to return call.

## 2020-08-03 DIAGNOSIS — M797 Fibromyalgia: Secondary | ICD-10-CM | POA: Diagnosis not present

## 2020-08-03 DIAGNOSIS — R296 Repeated falls: Secondary | ICD-10-CM | POA: Diagnosis not present

## 2020-08-03 DIAGNOSIS — M5441 Lumbago with sciatica, right side: Secondary | ICD-10-CM | POA: Diagnosis not present

## 2020-08-03 DIAGNOSIS — M6281 Muscle weakness (generalized): Secondary | ICD-10-CM | POA: Diagnosis not present

## 2020-08-03 DIAGNOSIS — G629 Polyneuropathy, unspecified: Secondary | ICD-10-CM | POA: Diagnosis not present

## 2020-08-03 DIAGNOSIS — F329 Major depressive disorder, single episode, unspecified: Secondary | ICD-10-CM | POA: Diagnosis not present

## 2020-08-03 DIAGNOSIS — M5442 Lumbago with sciatica, left side: Secondary | ICD-10-CM | POA: Diagnosis not present

## 2020-08-03 DIAGNOSIS — G894 Chronic pain syndrome: Secondary | ICD-10-CM | POA: Diagnosis not present

## 2020-08-03 DIAGNOSIS — Z743 Need for continuous supervision: Secondary | ICD-10-CM | POA: Diagnosis not present

## 2020-08-06 ENCOUNTER — Other Ambulatory Visit: Payer: Self-pay

## 2020-08-06 ENCOUNTER — Emergency Department (HOSPITAL_COMMUNITY): Payer: Medicare HMO

## 2020-08-06 ENCOUNTER — Encounter (HOSPITAL_COMMUNITY): Payer: Self-pay

## 2020-08-06 ENCOUNTER — Emergency Department (HOSPITAL_COMMUNITY)
Admission: EM | Admit: 2020-08-06 | Discharge: 2020-08-06 | Disposition: A | Discharge status: Home / Self Care | Payer: Medicare HMO | Attending: Emergency Medicine | Admitting: Emergency Medicine

## 2020-08-06 ENCOUNTER — Other Ambulatory Visit (HOSPITAL_COMMUNITY): Payer: Self-pay

## 2020-08-06 DIAGNOSIS — Z20822 Contact with and (suspected) exposure to covid-19: Secondary | ICD-10-CM | POA: Diagnosis not present

## 2020-08-06 DIAGNOSIS — Z7982 Long term (current) use of aspirin: Secondary | ICD-10-CM | POA: Insufficient documentation

## 2020-08-06 DIAGNOSIS — R519 Headache, unspecified: Secondary | ICD-10-CM | POA: Diagnosis not present

## 2020-08-06 DIAGNOSIS — R10815 Periumbilic abdominal tenderness: Secondary | ICD-10-CM | POA: Diagnosis not present

## 2020-08-06 DIAGNOSIS — M546 Pain in thoracic spine: Secondary | ICD-10-CM | POA: Diagnosis not present

## 2020-08-06 DIAGNOSIS — R531 Weakness: Secondary | ICD-10-CM | POA: Diagnosis not present

## 2020-08-06 DIAGNOSIS — E876 Hypokalemia: Secondary | ICD-10-CM | POA: Diagnosis not present

## 2020-08-06 DIAGNOSIS — M549 Dorsalgia, unspecified: Secondary | ICD-10-CM | POA: Diagnosis not present

## 2020-08-06 DIAGNOSIS — W01198A Fall on same level from slipping, tripping and stumbling with subsequent striking against other object, initial encounter: Secondary | ICD-10-CM | POA: Insufficient documentation

## 2020-08-06 DIAGNOSIS — M545 Low back pain, unspecified: Secondary | ICD-10-CM | POA: Insufficient documentation

## 2020-08-06 DIAGNOSIS — M542 Cervicalgia: Secondary | ICD-10-CM | POA: Diagnosis not present

## 2020-08-06 DIAGNOSIS — I10 Essential (primary) hypertension: Secondary | ICD-10-CM | POA: Diagnosis not present

## 2020-08-06 DIAGNOSIS — Z79899 Other long term (current) drug therapy: Secondary | ICD-10-CM | POA: Insufficient documentation

## 2020-08-06 DIAGNOSIS — R630 Anorexia: Secondary | ICD-10-CM | POA: Diagnosis not present

## 2020-08-06 DIAGNOSIS — W19XXXA Unspecified fall, initial encounter: Secondary | ICD-10-CM

## 2020-08-06 DIAGNOSIS — N390 Urinary tract infection, site not specified: Secondary | ICD-10-CM

## 2020-08-06 DIAGNOSIS — R059 Cough, unspecified: Secondary | ICD-10-CM | POA: Diagnosis not present

## 2020-08-06 DIAGNOSIS — J45909 Unspecified asthma, uncomplicated: Secondary | ICD-10-CM | POA: Diagnosis not present

## 2020-08-06 DIAGNOSIS — R110 Nausea: Secondary | ICD-10-CM | POA: Insufficient documentation

## 2020-08-06 DIAGNOSIS — S0990XA Unspecified injury of head, initial encounter: Secondary | ICD-10-CM | POA: Diagnosis not present

## 2020-08-06 LAB — BASIC METABOLIC PANEL
Anion gap: 19 — ABNORMAL HIGH (ref 5–15)
BUN: 8 mg/dL (ref 8–23)
CO2: 24 mmol/L (ref 22–32)
Calcium: 9.3 mg/dL (ref 8.9–10.3)
Chloride: 95 mmol/L — ABNORMAL LOW (ref 98–111)
Creatinine, Ser: 0.98 mg/dL (ref 0.44–1.00)
GFR, Estimated: >60 mL/min (ref >60)
Glucose, Bld: 75 mg/dL (ref 70–99)
Potassium: 2.8 mmol/L — ABNORMAL LOW (ref 3.5–5.1)
Sodium: 138 mmol/L (ref 135–145)

## 2020-08-06 LAB — MAGNESIUM: Magnesium: 1.5 mg/dL — ABNORMAL LOW (ref 1.7–2.4)

## 2020-08-06 LAB — SARS CORONAVIRUS 2 BY RT PCR (HOSPITAL ORDER, PERFORMED IN ~~LOC~~ HOSPITAL LAB): SARS Coronavirus 2: NEGATIVE

## 2020-08-06 LAB — CBC
HCT: 38.8 % (ref 36.0–46.0)
Hemoglobin: 12.2 g/dL (ref 12.0–15.0)
MCH: 27 pg (ref 26.0–34.0)
MCHC: 31.4 g/dL (ref 30.0–36.0)
MCV: 85.8 fL (ref 80.0–100.0)
Platelets: 365 10*3/uL (ref 150–400)
RBC: 4.52 MIL/uL (ref 3.87–5.11)
RDW: 14.3 % (ref 11.5–15.5)
WBC: 9.2 10*3/uL (ref 4.0–10.5)
nRBC: 0 % (ref 0.0–0.2)

## 2020-08-06 LAB — URINALYSIS, ROUTINE W REFLEX MICROSCOPIC
Bilirubin Urine: NEGATIVE
Glucose, UA: NEGATIVE mg/dL
Ketones, ur: 20 mg/dL — AB
Nitrite: POSITIVE — AB
Protein, ur: 30 mg/dL — AB
Specific Gravity, Urine: 1.017 (ref 1.005–1.030)
WBC, UA: >50 WBC/hpf — ABNORMAL HIGH (ref 0–5)
pH: 5 (ref 5.0–8.0)

## 2020-08-06 MED ORDER — MAGNESIUM SULFATE 4 GM/100ML IV SOLN
4.0000 g | Freq: Once | INTRAVENOUS | Status: AC
  Administered 2020-08-06: 4 g via INTRAVENOUS
  Filled 2020-08-06: qty 100

## 2020-08-06 MED ORDER — POTASSIUM CHLORIDE CRYS ER 20 MEQ PO TBCR
40.0000 meq | EXTENDED_RELEASE_TABLET | Freq: Once | ORAL | Status: AC
  Administered 2020-08-06: 40 meq via ORAL
  Filled 2020-08-06: qty 2

## 2020-08-06 MED ORDER — POTASSIUM CHLORIDE 10 MEQ/100ML IV SOLN
10.0000 meq | Freq: Once | INTRAVENOUS | Status: AC
  Administered 2020-08-06: 10 meq via INTRAVENOUS
  Filled 2020-08-06: qty 100

## 2020-08-06 MED ORDER — MAGNESIUM SULFATE IN D5W 1-5 GM/100ML-% IV SOLN: 1.0000 g | Freq: Once | INTRAVENOUS | Status: DC

## 2020-08-06 MED ORDER — SODIUM CHLORIDE 0.9 % IV BOLUS
1000.0000 mL | Freq: Once | INTRAVENOUS | Status: AC
  Administered 2020-08-06: 1000 mL via INTRAVENOUS

## 2020-08-06 MED ORDER — CEPHALEXIN 500 MG PO CAPS: 500.0000 mg | ORAL_CAPSULE | Freq: Two times a day (BID) | ORAL | 0 refills | Status: AC

## 2020-08-06 MED ORDER — POTASSIUM CHLORIDE ER 10 MEQ PO TBCR: 10.0000 meq | EXTENDED_RELEASE_TABLET | Freq: Every day | ORAL | 0 refills | Status: DC

## 2020-08-06 NOTE — ED Notes (Signed)
purewick in place will send urine sample when able.

## 2020-08-06 NOTE — ED Provider Notes (Signed)
He will be a little fishing expedition [facility]-EMERGENCY DEPT Provider Note   CSN: [id number] Arrival date & time: 08/06/20  1056     History Chief Complaint  Patient presents with  . Fall  . Weakness    Melinda [name] is a 70 y.o. female past medical history of GERD, fibromyalgia, presents for evaluation of generalized weakness, fall.  She reports that over the last week or so, she has had some generalized weakness, no energy.  She states she has had nausea and felt like she has not wanted to eat.  She denies any vomiting.  She states that she felt like her taste buds were messed up and she was not tasting the food.  She states she has had a slight cough that is nonproductive.  She reports that today, she was getting up from bed around 10 AM and states that she was going to walk down to the kitchen.  She reports when she got up, she was too weak to stand up and she fell down, hitting her head on the dresser.  She then landed on the floor.  She does not think she had any LOC.  She is not on blood thinners.  She reports since then, she has had a headache, pain in her neck as well as her back.  She has been able to ambulate since this happened.  She had no preceding chest pain that caused her to fall.  Patient reports that she has been vaccinated for COVID x1.  She does report that she lives in an apartment complex where several members have tested positive for Covid recently.  She denies any difficulty breathing, vomiting, urinary complaints, numbness/weakness of her arms or legs, diarrhea.  The history is provided by the patient.  Weakness Associated symptoms: cough and nausea   Associated symptoms: no abdominal pain, no chest pain, no dysuria, no fever, no headaches, no shortness of breath and no vomiting        Past Medical History:  Diagnosis Date  . Acute kidney injury (HCC)   . Allergy   . Anxiety   . Arthritis    "99% of my body" (11/11/2016)  . Asthma   .  Bursitis of both hips   . Cataract   . Cervical scoliosis   . Chronic back pain    "all over my back" (11/11/2016)  . Depressive disorder, not elsewhere classified   . Enthesopathy of hip region   . Environmental allergies    "I take Claritin qd; 365 days/year" (11/11/2016)  . Essential hypertension, benign   . Fibromyalgia   . Frequent falls   . GERD (gastroesophageal reflux disease)   . Headache    "at least 1/wk; may last for 2 days or so" (11/11/2016)  . History of blood transfusion 1985   w/hysterectomy  . Lumbar stenosis   . Migraine    "a few/month" (11/11/2016)  . Muscle weakness (generalized)   . Myalgia and myositis, unspecified   . Osteoporosis   . Other abnormal blood chemistry   . Other malaise and fatigue   . Raynaud's syndrome   . Sciatic nerve pain, left   . Sciatica   . Unspecified vitamin D deficiency     Patient Active Problem List   Diagnosis Date Noted  . Chronic back pain   . Closed compression fracture of first lumbar vertebra (HCC) 11/02/2019  . Anxiety and depression 11/02/2019  . Hypokalemia 11/02/2019  . Dry eyes, bilateral 10/05/2019  .  Mixed hyperlipidemia 10/05/2019  . Lower extremity weakness 09/18/2019  . Migraine headache 09/18/2019  . Acute kidney injury (HCC) 05/10/2019  . Near syncope 05/09/2019  . Fall at home, initial encounter 05/09/2019  . History of 2019 novel coronavirus disease (COVID-19) 05/09/2019  . Anemia of chronic disease 05/09/2019  . Acute on chronic renal insufficiency 05/09/2019  . COVID-19 virus infection 10/12/2018  . Polyneuropathy in diseases classified elsewhere (HCC) 10/04/2018  . Prediabetes 09/12/2017  . High risk medication use 09/12/2017  . Frequent falls 09/12/2017  . Chronic pain syndrome 08/14/2017  . Gastroesophageal reflux disease 08/14/2017  . Cough variant asthma 03/14/2017  . Pulmonary hypertension (HCC) 03/12/2017  . Right-sided chest pain 11/11/2016  . Asthma 11/11/2016  . Atypical chest  pain   . Costochondritis   . Chronic congestive heart failure (HCC)   . MDD (major depressive disorder), recurrent episode (HCC) 05/23/2015  . Allergic rhinitis 11/03/2014  . Paresthesia 09/08/2014  . Peripheral neuropathy 07/14/2014  . Morbid obesity due to excess calories (HCC) 07/14/2014  . Pain in limb 07/13/2014  . Abnormality of gait 07/13/2014  . Fibromyalgia 06/13/2014  . Spinal stenosis 01/27/2013  . HTN (hypertension) 11/30/2012    Past Surgical History:  Procedure Laterality Date  . ABDOMINAL HYSTERECTOMY  1985  . CESAREAN SECTION  1976; 1979  . IR KYPHO LUMBAR INC FX REDUCE BONE BX UNI/BIL CANNULATION INC/IMAGING  11/04/2019  . SHOULDER OPEN ROTATOR CUFF REPAIR Left      OB History   No obstetric history on file.     Family History  Problem Relation Age of Onset  . Dementia Mother   . Heart disease Mother   . Hypertension Mother   . Diabetes Mother   . Diabetes Sister   . Cancer Sister        lung  . Cancer Other        Nepher (Mothers side)  . Diabetes Maternal Grandmother   . Arthritis Maternal Grandmother   . Diabetes Cousin        Mother's side  . Arthritis Sister   . Arthritis Cousin        Mother's side   . Colon cancer Neg Hx   . Esophageal cancer Neg Hx   . Liver cancer Neg Hx   . Pancreatic cancer Neg Hx   . Rectal cancer Neg Hx   . Stomach cancer Neg Hx     Social History   Tobacco Use  . Smoking status: Never Smoker  . Smokeless tobacco: Never Used  Vaping Use  . Vaping Use: Never used  Substance Use Topics  . Alcohol use: No    Alcohol/week: 0.0 standard drinks  . Drug use: No    Home Medications Prior to Admission medications   Medication Sig Start Date End Date Taking? Authorizing Provider  acetaminophen (TYLENOL) 500 MG tablet Take 1,000 mg by mouth every evening.     [provider]  amitriptyline (ELAVIL) 150 MG tablet Take 1 tablet (150 mg total) by mouth at bedtime. 02/23/20   [name], [name] C, NP  ascorbic  acid (VITAMIN C) 500 MG tablet Take 1 tablet (500 mg total) by mouth 2 (two) times daily. 09/26/19   Margit Hanks, MD  aspirin EC 81 MG tablet Take 81 mg by mouth daily.    [provider]  carboxymethylcellulose (REFRESH PLUS) 0.5 % SOLN Place 2 drops into both eyes 2 (two) times daily as needed (dry eyes).     [provider]  citalopram (CELEXA) 20 MG tablet TAKE 1 TABLET BY MOUTH EVERY DAY 06/28/20   [name], [name] C, NP  citalopram (CELEXA) 40 MG tablet Take 0.5 tablets (20 mg total) by mouth daily. 04/03/20   [name], [name] C, NP  [employer] D3 50 MCG (2000 UT) CAPS TAKE 1 CAPSULE BY MOUTH EVERY DAY 11/08/19   [name], [name] C, NP  fluticasone (FLONASE) 50 MCG/ACT nasal spray Place 2 sprays into both nostrils daily as needed for allergies or rhinitis. 02/23/20   [name], [name] C, NP  furosemide (LASIX) 40 MG tablet Take 1 tablet (40 mg total) by mouth daily. 04/03/20   [name], [name] C, NP  loratadine (CLARITIN) 10 MG tablet TAKE 1 TABLET BY MOUTH EVERY DAY Patient taking differently: Take 10 mg by mouth daily.  04/09/20   [name], [name] C, NP  metoprolol tartrate (LOPRESSOR) 50 MG tablet TAKE ONE TABLET BY MOUTH TWICE DAILY FOR BLOOD PRESSURE 02/23/20   [name], [name] C, NP  oxyCODONE (ROXICODONE) 15 MG immediate release tablet Take 1 tablet (15 mg total) by mouth every 8 (eight) hours as needed for pain. 07/11/20   [name], [name] C, NP  SUMAtriptan (IMITREX) 25 MG tablet TAKE ONE TABLET BY MOUTH ONCE FOR HEADACHE. MAY REPEAT IN 2 HOURS IF HEADACHE PERSISTS OR RECURS. 07/03/20   [name], [name] C, NP  vitamin B-12 (CYANOCOBALAMIN) 500 MCG tablet Take 1 tablet (500 mcg total) by mouth daily. 09/26/19   Margit Hanks, MD    Allergies    Patient has no known allergies.  Review of Systems   Review of Systems  Constitutional: Positive for appetite change. Negative for fever.  Respiratory: Positive for cough. Negative for shortness of breath.   Cardiovascular: Negative for chest  pain.  Gastrointestinal: Positive for nausea. Negative for abdominal pain and vomiting.  Genitourinary: Negative for dysuria and hematuria.  Musculoskeletal: Positive for back pain and neck pain.  Neurological: Positive for weakness (generalized). Negative for headaches.  All other systems reviewed and are negative.   Physical Exam Updated Vital Signs BP 115/71   Pulse 74   Temp 97.7 F (36.5 C) (Oral)   Resp 16   SpO2 94%   Physical Exam Vitals and nursing note reviewed.  Constitutional:      Appearance: Normal appearance. She is well-developed and well-nourished.  HENT:     Head: Normocephalic and atraumatic.     Comments: No tenderness to palpation of skull. No deformities or crepitus noted. No open wounds, abrasions or lacerations.     Mouth/Throat:     Mouth: Oropharynx is clear and moist and mucous membranes are normal.  Eyes:     General: Lids are normal.     Extraocular Movements: EOM normal.     Conjunctiva/sclera: Conjunctivae normal.     Pupils: Pupils are equal, round, and reactive to light.     Comments: PERRL. EOMs intact. No nystagmus. No neglect.   Neck:     Comments: C-collar in place.  Tenderness palpation in midline.  No deformity or crepitus noted. Cardiovascular:     Rate and Rhythm: Normal rate and regular rhythm.     Pulses: Normal pulses.     Heart sounds: Normal heart sounds. No murmur heard. No friction rub. No gallop.   Pulmonary:     Effort: Pulmonary effort is normal.     Breath sounds: Normal breath sounds.  Abdominal:     Palpations: Abdomen is soft. Abdomen is not rigid.     Tenderness: There  is abdominal tenderness. There is no guarding.     Comments: Mild tenderness noted to the periumbilical region.  No rigidity, guarding.  Musculoskeletal:        General: Normal range of motion.     Comments: Tenderness palpation in midline T and L-spine.  No deformity or crepitus noted.  Skin:    General: Skin is warm and dry.     Capillary  Refill: Capillary refill takes less than 2 seconds.  Neurological:     Mental Status: She is alert and oriented to person, place, and time.     Comments: Follows commands, Moves all extremities  5/5 strength to BUE and BLE  Sensation intact throughout all major nerve distributions  Psychiatric:        Mood and Affect: Mood and affect normal.        Speech: Speech normal.     ED Results / Procedures / Treatments   Labs (all labs ordered are listed, but only abnormal results are displayed) Labs Reviewed  BASIC METABOLIC PANEL - Abnormal; Notable for the following components:      Result Value   Potassium 2.8 (*)    Chloride 95 (*)    Anion gap 19 (*)    All other components within normal limits  SARS CORONAVIRUS 2 BY RT PCR (HOSPITAL ORDER, PERFORMED IN [facility])  CBC  URINALYSIS, ROUTINE W REFLEX MICROSCOPIC  MAGNESIUM  CBG MONITORING, ED    EKG EKG Interpretation  Date/Time:  Monday August 06 2020 14:30:33 EST Ventricular Rate:  67 PR Interval:    QRS Duration: 112 QT Interval:  542 QTC Calculation: 573 R Axis:   42 Text Interpretation: Sinus rhythm Consider left atrial enlargement Borderline intraventricular conduction delay Abnormal R-wave progression, early transition Borderline repolarization abnormality Prolonged QT interval No significant change since last tracing Confirmed by Jacalyn Lefevre [phone]) on 08/06/2020 2:35:18 PM   Radiology CT Head Wo Contrast  Result Date: 08/06/2020 CLINICAL DATA:  Head trauma, minor. Neck trauma. Additional provided: Fall this morning, patient reports head and neck pain. EXAM: CT HEAD WITHOUT CONTRAST CT CERVICAL SPINE WITHOUT CONTRAST TECHNIQUE: Multidetector CT imaging of the head and cervical spine was performed following the standard protocol without intravenous contrast. Multiplanar CT image reconstructions of the cervical spine were also generated. COMPARISON:  Brain MRI 09/04/2014. Prior head CT examinations  07/29/2020 and earlier. Cervical spine CT 07/29/2020. FINDINGS: CT HEAD FINDINGS Brain: Mild cerebral and cerebellar atrophy. There is no acute intracranial hemorrhage. No demarcated cortical infarct. No extra-axial fluid collection. No evidence of intracranial mass. No midline shift. Redemonstrated partially empty and gslightly expanded sella turcica. Vascular: No hyperdense vessel.  Atherosclerotic calcifications. Skull: Normal. Negative for fracture or focal lesion. Sinuses/Orbits: Visualized orbits show no acute finding. No significant paranasal sinus disease at the imaged levels. CT CERVICAL SPINE FINDINGS Alignment: Straightening of the expected cervical lordosis. Cervical levocurvature. No significant spondylolisthesis. Skull base and vertebrae: The basion-dental and atlanto-dental intervals are maintained. Soft tissues and spinal canal: No prevertebral soft tissue swelling or visible canal hematoma. Disc levels: No more than mild disc space narrowing at any level. Shallow multilevel disc bulges. Upper chest: No consolidation within the imaged lung apices. No visible pneumothorax. IMPRESSION: CT head: 1. No evidence of acute intracranial abnormality. 2. Mild generalized atrophy of the brain, stable. CT cervical spine: 1. No evidence of acute fracture to the cervical spine. 2. Nonspecific straightening of the expected cervical lordosis. 3. Cervical levocurvature, possibly positional. 4. Cervical spondylosis  as described. Electronically Signed   By: Jackey Loge DO   On: 08/06/2020 14:38   CT Cervical Spine Wo Contrast  Result Date: 08/06/2020 CLINICAL DATA:  Head trauma, minor. Neck trauma. Additional provided: Fall this morning, patient reports head and neck pain. EXAM: CT HEAD WITHOUT CONTRAST CT CERVICAL SPINE WITHOUT CONTRAST TECHNIQUE: Multidetector CT imaging of the head and cervical spine was performed following the standard protocol without intravenous contrast. Multiplanar CT image reconstructions  of the cervical spine were also generated. COMPARISON:  Brain MRI 09/04/2014. Prior head CT examinations 07/29/2020 and earlier. Cervical spine CT 07/29/2020. FINDINGS: CT HEAD FINDINGS Brain: Mild cerebral and cerebellar atrophy. There is no acute intracranial hemorrhage. No demarcated cortical infarct. No extra-axial fluid collection. No evidence of intracranial mass. No midline shift. Redemonstrated partially empty and gslightly expanded sella turcica. Vascular: No hyperdense vessel.  Atherosclerotic calcifications. Skull: Normal. Negative for fracture or focal lesion. Sinuses/Orbits: Visualized orbits show no acute finding. No significant paranasal sinus disease at the imaged levels. CT CERVICAL SPINE FINDINGS Alignment: Straightening of the expected cervical lordosis. Cervical levocurvature. No significant spondylolisthesis. Skull base and vertebrae: The basion-dental and atlanto-dental intervals are maintained. Soft tissues and spinal canal: No prevertebral soft tissue swelling or visible canal hematoma. Disc levels: No more than mild disc space narrowing at any level. Shallow multilevel disc bulges. Upper chest: No consolidation within the imaged lung apices. No visible pneumothorax. IMPRESSION: CT head: 1. No evidence of acute intracranial abnormality. 2. Mild generalized atrophy of the brain, stable. CT cervical spine: 1. No evidence of acute fracture to the cervical spine. 2. Nonspecific straightening of the expected cervical lordosis. 3. Cervical levocurvature, possibly positional. 4. Cervical spondylosis as described. Electronically Signed   By: Jackey Loge DO   On: 08/06/2020 14:38    Procedures Procedures   Medications Ordered in ED Medications  potassium chloride 10 mEq in 100 mL IVPB (10 mEq Intravenous New Bag/Given 08/06/20 1452)  sodium chloride 0.9 % bolus 1,000 mL (1,000 mLs Intravenous New Bag/Given 08/06/20 1451)    ED Course  I have reviewed the triage vital signs and the nursing  notes.  Pertinent labs & imaging results that were available during my care of the patient were reviewed by me and considered in my medical decision making (see chart for details).    MDM Rules/Calculators/A&P                          70 y.o. F who presents for evaluation of generalized weakness and fall that occurred this morning.  She reports that she felt weak and fell back and hit her head against the dresser.  She did not have any LOC.  She said since then, she has had headache, neck pain, back pain.  She reports she has felt weak for about a week or so.  She has had some nausea, decreased appetite.  She has gotten Covid vaccine x1.  She does report several members of her apartment building have had Covid.  On initial ED arrival, she is afebrile, toxic appearing.  Vital signs are stable.  On exam, she has tenderness into the head, neck, T and L-spine.  No neuro deficits noted on exam.  She has some very mild generalized tenderness to her abdomen.  No focal tenderness.  Labs, imaging ordered.  BMP shows potassium 2.8.  Anion gap of 19.  CBC shows no leukocytosis or anemia.  Fluids ordered, potassium repletion ordered.  Patient  signed out to Army Melia, PA-C.   Portions of this note were generated with Scientist, clinical (histocompatibility and immunogenetics). Dictation errors may occur despite best attempts at proofreading.  Final Clinical Impression(s) / ED Diagnoses Final diagnoses:  Generalized weakness  Hypokalemia    Rx / DC Orders ED Discharge Orders    None       Maxwell Caul, PA-C 08/06/20 1524    Jacalyn Lefevre, MD 08/06/20 1539

## 2020-08-06 NOTE — Discharge Instructions (Addendum)
Follow-up with your doctor this week for repeat lab work.  You will need your potassium level rechecked. Take your potassium pill daily.    This prescription was sent to your pharmacy.  Take Keflex as prescribed and complete the hospital

## 2020-08-06 NOTE — ED Provider Notes (Addendum)
70 year old female with complaint of generalized weakness x 1 week, cough, decreased PO intake. Larey Seat this morning due to weakness. Pain in head, neck, back.  CT head, c-spine. K 2.9, getting IV replacement ( ), plan to dc home on PO. Checking UA. Homehealth ordered already.  Physical Exam  BP 115/71   Pulse 74   Temp 97.7 F (36.5 C) (Oral)   Resp 16   SpO2 94%   Physical Exam  ED Course/Procedures     Procedures  MDM  Magnesium level found to be slightly low, given replacement.  CT head and C-spine without acute findings.  Patient be discharged home, recommend oral potassium daily, prescription sent to her pharmacy.  Discussed results with patient, patient will call her PCP tomorrow to schedule follow-up appointment and repeat lab work.  Patient is sitting up in bed, eating and drinking at this time, feeling improved.  Also found to have urinary tract infection, will treat with Keflex.       Jeannie Fend, PA-C 08/06/20 1706    Jeannie Fend, PA-C 08/06/20 [id number]    Pricilla Loveless, MD 08/09/20 [phone]

## 2020-08-06 NOTE — ED Triage Notes (Signed)
Pt BIB EMS from home. Pt tripped and fell around 10am and hit her head. Pt denies hitting her head and is not on blooding thinners. Pt now endorses head, neck, and lower back pain. A&O x4.   BP 128/80 HR 72 RR 18 98% RA

## 2020-08-07 NOTE — Telephone Encounter (Signed)
Left message on voicemail for patient to return call when available, I instructed patient to speak with whomever answers the phone to schedule an appointment to be evaluated for falls

## 2020-08-09 DIAGNOSIS — Z743 Need for continuous supervision: Secondary | ICD-10-CM | POA: Diagnosis not present

## 2020-08-09 DIAGNOSIS — F329 Major depressive disorder, single episode, unspecified: Secondary | ICD-10-CM | POA: Diagnosis not present

## 2020-08-09 DIAGNOSIS — M797 Fibromyalgia: Secondary | ICD-10-CM | POA: Diagnosis not present

## 2020-08-09 DIAGNOSIS — G894 Chronic pain syndrome: Secondary | ICD-10-CM | POA: Diagnosis not present

## 2020-08-09 DIAGNOSIS — G629 Polyneuropathy, unspecified: Secondary | ICD-10-CM | POA: Diagnosis not present

## 2020-08-09 DIAGNOSIS — M6281 Muscle weakness (generalized): Secondary | ICD-10-CM | POA: Diagnosis not present

## 2020-08-09 DIAGNOSIS — R296 Repeated falls: Secondary | ICD-10-CM | POA: Diagnosis not present

## 2020-08-09 DIAGNOSIS — M5442 Lumbago with sciatica, left side: Secondary | ICD-10-CM | POA: Diagnosis not present

## 2020-08-09 DIAGNOSIS — M5441 Lumbago with sciatica, right side: Secondary | ICD-10-CM | POA: Diagnosis not present

## 2020-08-13 DIAGNOSIS — M5442 Lumbago with sciatica, left side: Secondary | ICD-10-CM | POA: Diagnosis not present

## 2020-08-13 DIAGNOSIS — G894 Chronic pain syndrome: Secondary | ICD-10-CM | POA: Diagnosis not present

## 2020-08-13 DIAGNOSIS — F329 Major depressive disorder, single episode, unspecified: Secondary | ICD-10-CM | POA: Diagnosis not present

## 2020-08-13 DIAGNOSIS — M797 Fibromyalgia: Secondary | ICD-10-CM | POA: Diagnosis not present

## 2020-08-13 DIAGNOSIS — M6281 Muscle weakness (generalized): Secondary | ICD-10-CM | POA: Diagnosis not present

## 2020-08-13 DIAGNOSIS — Z743 Need for continuous supervision: Secondary | ICD-10-CM | POA: Diagnosis not present

## 2020-08-13 DIAGNOSIS — R296 Repeated falls: Secondary | ICD-10-CM | POA: Diagnosis not present

## 2020-08-13 DIAGNOSIS — G629 Polyneuropathy, unspecified: Secondary | ICD-10-CM | POA: Diagnosis not present

## 2020-08-13 DIAGNOSIS — M5441 Lumbago with sciatica, right side: Secondary | ICD-10-CM | POA: Diagnosis not present

## 2020-08-14 ENCOUNTER — Other Ambulatory Visit: Payer: Self-pay | Admitting: *Deleted

## 2020-08-14 NOTE — Telephone Encounter (Signed)
Patient called and left message on clinical intake stating that she needs a refill on her pain medication.  Contract on file needs updated. Added note to scheduled appointment for 10/02/2020 Last Epic RF: 07/11/2020 Pended Rx and sent to [facility] for approval.

## 2020-08-14 NOTE — Telephone Encounter (Signed)
LMOM for patient to return call.

## 2020-08-14 NOTE — Telephone Encounter (Signed)
Sorry no refills.Last month we notified patient that she will need visit prior to her next medication refill.

## 2020-08-15 ENCOUNTER — Ambulatory Visit: Payer: Medicare HMO | Admitting: Family

## 2020-08-15 ENCOUNTER — Telehealth: Payer: Self-pay

## 2020-08-15 NOTE — Telephone Encounter (Signed)
I tried to call patients number that's listed and it went straight to voice mail and said the voice mail was full so I checked and the patients sons number was listed as a contact so I called him and spoke with him about his mother wanting to get her pain medication refilled and he asked why when she or he calls and leaves a message no one ever calls them back and they had been trying to speak to someone since the 9 th of the month when they get through the call is sent around and no one answers I explained that I had no control over the phone system but when I tried to call his mother today to return her call the phone went straight to her full voice mail and so I called him in hopes he could reach out to her he asked if she could call me back directly and I said yes and gave him my number for my in office phone line and they called me back using a conference call and I made an appointment for patient for tomorrow at 8:30 am

## 2020-08-15 NOTE — Telephone Encounter (Signed)
Patient called to request for her medications as she says she is desperate need of them and if we no longer want her to be a patient here to let her know and she will look elsewhere I look in her chart and there is a note stating that she will not receive any more refills until she has been seen in the office she has an appointment scheduled for 10/02/2020   Please Advise

## 2020-08-15 NOTE — Telephone Encounter (Signed)
She needs a sooner appointment this week or next week.Platient was informed last month and the previous that she will no longer get her pain meds refilled until she is seen at the office.

## 2020-08-16 ENCOUNTER — Ambulatory Visit (INDEPENDENT_AMBULATORY_CARE_PROVIDER_SITE_OTHER): Payer: Medicare HMO | Admitting: Family

## 2020-08-16 ENCOUNTER — Encounter: Payer: Self-pay | Admitting: Family

## 2020-08-16 ENCOUNTER — Other Ambulatory Visit: Payer: Self-pay

## 2020-08-16 VITALS — BP 92/70 | HR 89 | Temp 96.9°F | Resp 16 | Ht 64.0 in

## 2020-08-16 DIAGNOSIS — R2681 Unsteadiness on feet: Secondary | ICD-10-CM

## 2020-08-16 DIAGNOSIS — M5442 Lumbago with sciatica, left side: Secondary | ICD-10-CM

## 2020-08-16 DIAGNOSIS — F329 Major depressive disorder, single episode, unspecified: Secondary | ICD-10-CM | POA: Diagnosis not present

## 2020-08-16 DIAGNOSIS — M797 Fibromyalgia: Secondary | ICD-10-CM | POA: Diagnosis not present

## 2020-08-16 DIAGNOSIS — R32 Unspecified urinary incontinence: Secondary | ICD-10-CM

## 2020-08-16 DIAGNOSIS — F332 Major depressive disorder, recurrent severe without psychotic features: Secondary | ICD-10-CM | POA: Diagnosis not present

## 2020-08-16 DIAGNOSIS — R296 Repeated falls: Secondary | ICD-10-CM

## 2020-08-16 DIAGNOSIS — M5441 Lumbago with sciatica, right side: Secondary | ICD-10-CM | POA: Diagnosis not present

## 2020-08-16 DIAGNOSIS — G894 Chronic pain syndrome: Secondary | ICD-10-CM

## 2020-08-16 DIAGNOSIS — G43009 Migraine without aura, not intractable, without status migrainosus: Secondary | ICD-10-CM | POA: Diagnosis not present

## 2020-08-16 DIAGNOSIS — M6281 Muscle weakness (generalized): Secondary | ICD-10-CM | POA: Diagnosis not present

## 2020-08-16 DIAGNOSIS — E876 Hypokalemia: Secondary | ICD-10-CM

## 2020-08-16 DIAGNOSIS — G8929 Other chronic pain: Secondary | ICD-10-CM | POA: Diagnosis not present

## 2020-08-16 DIAGNOSIS — G629 Polyneuropathy, unspecified: Secondary | ICD-10-CM | POA: Diagnosis not present

## 2020-08-16 DIAGNOSIS — Z743 Need for continuous supervision: Secondary | ICD-10-CM | POA: Diagnosis not present

## 2020-08-16 MED ORDER — AMITRIPTYLINE HCL 150 MG PO TABS: 150.0000 mg | ORAL_TABLET | Freq: Every day | ORAL | 1 refills | Status: DC

## 2020-08-16 MED ORDER — SUMATRIPTAN SUCCINATE 25 MG PO TABS: ORAL_TABLET | ORAL | 0 refills | Status: DC

## 2020-08-16 MED ORDER — CITALOPRAM HYDROBROMIDE 40 MG PO TABS: 20.0000 mg | ORAL_TABLET | Freq: Every day | ORAL | 1 refills | Status: DC

## 2020-08-16 MED ORDER — OXYCODONE HCL 15 MG PO TABS: 15.0000 mg | ORAL_TABLET | Freq: Three times a day (TID) | ORAL | 0 refills | Status: DC | PRN

## 2020-08-16 NOTE — Progress Notes (Signed)
Provider: Marlowe Sax FNP-C  Jenetta Wease, Nelda Bucks, NP  Patient Care Team: Jeffree Cazeau, Nelda Bucks, NP as PCP - General (Family Medicine)  Extended Emergency Contact Information Primary Emergency Contact: Glennie [place] Mobile Phone: [phone] Relation: Son Secondary Emergency Contact: Amparo [place] Mobile Phone: [phone] Relation: Daughter  Code Status:  Full Code  Goals of care: Advanced Directive information Advanced Directives 08/16/2020  Does Patient Have a Medical Advance Directive? No  Type of Advance Directive -  Does patient want to make changes to medical advance directive? -  Copy of [place] in Chart? -  Would patient like information on creating a medical advance directive? No - Patient declined     Chief Complaint  Patient presents with  . Medical Management of Chronic Issues    Pain medication management.  . Health Maintenance    Discuss the need for Colonoscopy.  . Immunizations    Discuss the need for Influenza Vaccine, and Covid Vaccine.    HPI:  Pt is a 70 y.o. female seen today for an acute visit for follow up ED visit 08/06/2020 and medication management.she was seen in the ED 08/06/2020 for generalized weakness and fall when trying to walk to the Kitchen.she had no LOC.Had nausea but no vomiting.Also had a slight nonproductive cough.she had several members in her apartment complex who tested positive for COVID-19. Her magnesium level was slightly low 1.5 , potassium was 2.8 was replaced.CT head and C-spine was negative for acute abnormalities.she was discharged home and advised to take potassium daily.she was advised to follow up with PCP but did not show up for appointment.she was rescheduled but cancelled. CMA jasmine Dillard walked into examination room to obtain vital signs observed patient lying with upper body between her scooter and examination table with legs across the scooter.Scooter  was power button was off.she states was trying to reposition herself on her scooter but fell off. She denies hitting head on the floor or exam table.she was able to move extremities and had no bruises.she was assister off the floor x 2 CMA's and provider. No acute pain reported.VSS. office manager notified of patient's fall. Of note,Staff was present outside the exam room no bung or noise or screams heard when patient fell.    Past Medical History:  Diagnosis Date  . Acute kidney injury ([place])   . Allergy   . Anxiety   . Arthritis    "99% of my body" (11/11/2016)  . Asthma   . Bursitis of both hips   . Cataract   . Cervical scoliosis   . Chronic back pain    "all over my back" (11/11/2016)  . Depressive disorder, not elsewhere classified   . Enthesopathy of hip region   . Environmental allergies    "I take Claritin qd; 365 days/year" (11/11/2016)  . Essential hypertension, benign   . Fibromyalgia   . Frequent falls   . GERD (gastroesophageal reflux disease)   . Headache    "at least 1/wk; may last for 2 days or so" (11/11/2016)  . History of blood transfusion 1985   w/hysterectomy  . Lumbar stenosis   . Migraine    "a few/month" (11/11/2016)  . Muscle weakness (generalized)   . Myalgia and myositis, unspecified   . Osteoporosis   . Other abnormal blood chemistry   . Other malaise and fatigue   . Raynaud's syndrome   . Sciatic nerve pain, left   . Sciatica   .  Unspecified vitamin D deficiency    Past Surgical History:  Procedure Laterality Date  . ABDOMINAL HYSTERECTOMY  1985  . [place]; 1979  . IR KYPHO LUMBAR INC FX REDUCE BONE BX UNI/BIL CANNULATION INC/IMAGING  11/04/2019  . SHOULDER OPEN ROTATOR CUFF REPAIR Left     No Known Allergies  Outpatient Encounter Medications as of 08/16/2020  Medication Sig  . acetaminophen (TYLENOL) 500 MG tablet Take 1,000 mg by mouth every evening.   Marland [place] ascorbic acid (VITAMIN C) 500 MG tablet Take 1 tablet (500 mg total) by  mouth 2 (two) times daily.  Marland Kitchen aspirin EC 81 MG tablet Take 81 mg by mouth daily.  . carboxymethylcellulose (REFRESH PLUS) 0.5 % SOLN Place 2 drops into both eyes 2 (two) times daily as needed (dry eyes).   . citalopram (CELEXA) 20 MG tablet TAKE 1 TABLET BY MOUTH EVERY DAY  . [employer] D3 50 MCG (2000 UT) CAPS TAKE 1 CAPSULE BY MOUTH EVERY DAY  . fluticasone (FLONASE) 50 MCG/ACT nasal spray Place 2 sprays into both nostrils daily as needed for allergies or rhinitis.  . furosemide (LASIX) 40 MG tablet Take 1 tablet (40 mg total) by mouth daily.  Marland Kitchen loratadine (CLARITIN) 10 MG tablet TAKE 1 TABLET BY MOUTH EVERY DAY  . metoprolol tartrate (LOPRESSOR) 50 MG tablet TAKE ONE TABLET BY MOUTH TWICE DAILY FOR BLOOD PRESSURE  . vitamin B-12 (CYANOCOBALAMIN) 500 MCG tablet Take 1 tablet (500 mcg total) by mouth daily.  Marland Kitchen zinc gluconate 50 MG tablet Take 50 mg by mouth daily.  . [DISCONTINUED] amitriptyline (ELAVIL) 150 MG tablet Take 1 tablet (150 mg total) by mouth at bedtime.  . [DISCONTINUED] citalopram (CELEXA) 40 MG tablet Take 0.5 tablets (20 mg total) by mouth daily.  . [DISCONTINUED] oxyCODONE (ROXICODONE) 15 MG immediate release tablet Take 1 tablet (15 mg total) by mouth every 8 (eight) hours as needed for pain.  . [DISCONTINUED] SUMAtriptan (IMITREX) 25 MG tablet TAKE ONE TABLET BY MOUTH ONCE FOR HEADACHE. MAY REPEAT IN 2 HOURS IF HEADACHE PERSISTS OR RECURS.  Marland Kitchen amitriptyline (ELAVIL) 150 MG tablet Take 1 tablet (150 mg total) by mouth at bedtime.  . citalopram (CELEXA) 40 MG tablet Take 0.5 tablets (20 mg total) by mouth daily.  Marland Kitchen oxyCODONE (ROXICODONE) 15 MG immediate release tablet Take 1 tablet (15 mg total) by mouth every 8 (eight) hours as needed for pain.  . potassium chloride (KLOR-CON) 10 MEQ tablet Take 1 tablet (10 mEq total) by mouth daily for 5 days.  . SUMAtriptan (IMITREX) 25 MG tablet TAKE ONE TABLET BY MOUTH ONCE FOR HEADACHE. MAY REPEAT IN 2 HOURS IF HEADACHE PERSISTS OR RECURS.   No  facility-administered encounter medications on file as of 08/16/2020.    Review of Systems  Constitutional: Negative for appetite change, chills, fatigue and fever.  HENT: Negative for congestion, rhinorrhea, sinus pressure, sinus pain, sneezing, sore throat and trouble swallowing.   Eyes: Negative for discharge, redness, itching and visual disturbance.  Respiratory: Negative for cough, chest tightness, shortness of breath and wheezing.   Cardiovascular: Negative for chest pain, palpitations and leg swelling.  Gastrointestinal: Negative for abdominal distention, abdominal pain, constipation, diarrhea, nausea and vomiting.  Endocrine: Negative for cold intolerance, heat intolerance, polydipsia, polyphagia and polyuria.  Genitourinary: Negative for difficulty urinating, dysuria, flank pain and vaginal bleeding.       Incontinent   Musculoskeletal: Positive for arthralgias, back pain and gait problem. Negative for joint swelling, myalgias, neck pain and  neck stiffness.  Skin: Negative for color change, pallor and rash.  Neurological: Negative for dizziness, speech difficulty and light-headedness.       Chronic migraine  Hematological: Does not bruise/bleed easily.  Psychiatric/Behavioral: Negative for agitation, confusion and sleep disturbance. The patient is not nervous/anxious.     Immunization History  Administered Date(s) Administered  . Fluad Quad(high Dose 65+) 03/29/2019  . Influenza,inj,Quad PF,6+ Mos 05/23/2015, 05/28/2016, 03/24/2018  . Influenza-Unspecified 07/07/2012, 02/28/2014  . PFIZER(Purple Top)SARS-COV-2 Vaccination 10/03/2019  . Pneumococcal Conjugate-13 07/14/2014  . Pneumococcal Polysaccharide-23 09/11/2017, 11/03/2019  . Tdap 01/26/2016   Pertinent  Health Maintenance Due  Topic Date Due  . COLONOSCOPY (Pts 45-6yr Insurance coverage will need to be confirmed)  Never done  . MAMMOGRAM  09/04/2019  . INFLUENZA VACCINE  01/29/2020  . DEXA SCAN  Completed  . PNA  vac Low Risk Adult  Completed   Fall Risk  08/16/2020 04/03/2020 03/09/2020 12/06/2019 11/17/2019  Falls in the past year? 1 1 1 1 1   Number falls in past yr: 1 1 1 1 1   Comment - - - - -  Injury with Fall? 0 1 1 1 1   Comment - - - - -  Risk Factor Category  - - - - -  Risk for fall due to : - - - - -  Follow up - - - - -   Functional Status Survey:    Vitals:   08/16/20 0830  BP: 92/70  Pulse: 89  Resp: 16  Temp: (!) 96.9 F (36.1 C)  SpO2: 96%  Height: 5' 4"  (1.626 m)   Body mass index is 37.76 kg/m. Physical Exam Vitals reviewed.  Constitutional:      General: She is not in acute distress.    Appearance: She is obese. She is not ill-appearing.  HENT:     Nose: Nose normal. No congestion or rhinorrhea.     Mouth/Throat:     Mouth: Mucous membranes are moist.     Pharynx: Oropharynx is clear. No oropharyngeal exudate or posterior oropharyngeal erythema.  Eyes:     General: No scleral icterus.       Right eye: No discharge.        Left eye: No discharge.     Extraocular Movements: Extraocular movements intact.     Conjunctiva/sclera: Conjunctivae normal.     Pupils: Pupils are equal, round, and reactive to light.  Neck:     Vascular: No carotid bruit.  Cardiovascular:     Rate and Rhythm: Normal rate and regular rhythm.     Pulses: Normal pulses.     Heart sounds: Normal heart sounds. No murmur heard. No friction rub. No gallop.   Pulmonary:     Effort: Pulmonary effort is normal. No respiratory distress.     Breath sounds: Normal breath sounds. No wheezing, rhonchi or rales.  Chest:     Chest wall: No tenderness.  Abdominal:     General: Bowel sounds are normal. There is no distension.     Palpations: Abdomen is soft. There is no mass.     Tenderness: There is no abdominal tenderness. There is no right CVA tenderness, left CVA tenderness, guarding or rebound.  Musculoskeletal:        General: No swelling or tenderness.     Cervical back: Normal range of  motion. No rigidity or tenderness.     Right lower leg: No edema.     Left lower leg: No edema.  Comments: Unsteady gait transferred to scooter with assistance during visit  Lymphadenopathy:     Cervical: No cervical adenopathy.  Skin:    General: Skin is warm and dry.     Coloration: Skin is not pale.     Findings: No bruising, erythema or rash.  Neurological:     Mental Status: She is alert and oriented to person, place, and time.     Cranial Nerves: No cranial nerve deficit.     Sensory: No sensory deficit.     Motor: No weakness.     Gait: Gait abnormal.  Psychiatric:        Mood and Affect: Mood normal.        Behavior: Behavior normal.        Thought Content: Thought content normal.        Judgment: Judgment normal.    Labs reviewed: Recent Labs    11/03/19 0235 11/04/19 0307 04/03/20 1730 04/24/20 1738 07/29/20 2015 08/06/20 1124 08/06/20 1125  NA 139 135   < > 140 137 138  --   K 4.5 4.5   < > 3.6 2.4* 2.8*  --   CL 105 103   < > 103 96* 95*  --   CO2 26 24   < > 25 26 24   --   GLUCOSE 93 97   < > 90 95 75  --   BUN 13 13   < > 9 5* 8  --   CREATININE 1.40* 1.42*   < > 1.20* 0.99 0.98  --   CALCIUM 8.6* 8.5*   < > 9.4 9.3 9.3  --   MG 2.2 2.1  --   --   --   --  1.5*  PHOS 3.6  --   --   --   --   --   --    < > = values in this interval not displayed.   Recent Labs    09/12/19 1240 10/05/19 0942 11/03/19 0235 11/04/19 0307 04/03/20 1730  AST 17   < > 12* 12* 12  ALT 16   < > 9 10 8   ALKPHOS 71  --  54 54  --   BILITOT 0.6   < > 0.5 0.7 0.8  PROT 7.6   < > 6.3* 6.1* 7.3  ALBUMIN 4.2  --  3.5 3.4*  --    < > = values in this interval not displayed.   Recent Labs    11/01/19 1352 11/02/19 0244 04/03/20 1730 04/24/20 1738 07/29/20 2015 08/06/20 1124  WBC 6.9   < > 6.2 6.7 9.6 9.2  NEUTROABS 3.0  --  2,796 3.0  --   --   HGB 11.6*   < > 12.4 13.3 12.5 12.2  HCT 38.0   < > 38.0 42.8 40.2 38.8  MCV 90.7   < > 83.5 87.9 85.9 85.8  PLT 311    < > 399 336 393 365   < > = values in this interval not displayed.   Lab Results  Component Value Date   TSH 1.31 04/03/2020   Lab Results  Component Value Date   HGBA1C 5.3 03/29/2019   Lab Results  Component Value Date   CHOL 211 (H) 04/03/2020   HDL 62 04/03/2020   LDLCALC 128 (H) 04/03/2020   TRIG 103 04/03/2020   CHOLHDL 3.4 04/03/2020    Significant Diagnostic Results in last 30 days:  DG Thoracic Spine 2 View  Result  Date: 08/06/2020 CLINICAL DATA:  Back pain after fall. EXAM: THORACIC SPINE 2 VIEWS COMPARISON:  October 10, 2017. FINDINGS: There is no evidence of thoracic spine fracture. Alignment is normal. No other significant bone abnormalities are identified. IMPRESSION: Negative. Electronically Signed   By: Marijo Conception M.D.   On: 08/06/2020 16:41   DG Lumbar Spine Complete  Result Date: 08/06/2020 CLINICAL DATA:  Low back pain after fall. EXAM: LUMBAR SPINE - COMPLETE 4+ VIEW COMPARISON:  October 10, 2017. FINDINGS: Status post kyphoplasty of L1 vertebral body. No fracture or spondylolisthesis is noted. Disc spaces are well-maintained. IMPRESSION: Status post L1 kyphoplasty. No acute abnormality seen in the lumbar spine. Electronically Signed   By: Marijo Conception M.D.   On: 08/06/2020 16:43   CT Head Wo Contrast  Result Date: 08/06/2020 CLINICAL DATA:  Head trauma, minor. Neck trauma. Additional provided: Fall this morning, patient reports head and neck pain. EXAM: CT HEAD WITHOUT CONTRAST CT CERVICAL SPINE WITHOUT CONTRAST TECHNIQUE: Multidetector CT imaging of the head and cervical spine was performed following the standard protocol without intravenous contrast. Multiplanar CT image reconstructions of the cervical spine were also generated. COMPARISON:  Brain MRI 09/04/2014. Prior head CT examinations 07/29/2020 and earlier. Cervical spine CT 07/29/2020. FINDINGS: CT HEAD FINDINGS Brain: Mild cerebral and cerebellar atrophy. There is no acute intracranial hemorrhage. No  demarcated cortical infarct. No extra-axial fluid collection. No evidence of intracranial mass. No midline shift. Redemonstrated partially empty and gslightly expanded sella turcica. Vascular: No hyperdense vessel.  Atherosclerotic calcifications. Skull: Normal. Negative for fracture or focal lesion. Sinuses/Orbits: Visualized orbits show no acute finding. No significant paranasal sinus disease at the imaged levels. CT CERVICAL SPINE FINDINGS Alignment: Straightening of the expected cervical lordosis. Cervical levocurvature. No significant spondylolisthesis. Skull base and vertebrae: The basion-dental and atlanto-dental intervals are maintained. Soft tissues and spinal canal: No prevertebral soft tissue swelling or visible canal hematoma. Disc levels: No more than mild disc space narrowing at any level. Shallow multilevel disc bulges. Upper chest: No consolidation within the imaged lung apices. No visible pneumothorax. IMPRESSION: CT head: 1. No evidence of acute intracranial abnormality. 2. Mild generalized atrophy of the brain, stable. CT cervical spine: 1. No evidence of acute fracture to the cervical spine. 2. Nonspecific straightening of the expected cervical lordosis. 3. Cervical levocurvature, possibly positional. 4. Cervical spondylosis as described. Electronically Signed   By: Kellie Simmering DO   On: 08/06/2020 14:38   CT HEAD WO CONTRAST  Result Date: 07/29/2020 CLINICAL DATA:  Recent fall with headaches and facial pain, initial encounter EXAM: CT HEAD WITHOUT CONTRAST CT CERVICAL SPINE WITHOUT CONTRAST TECHNIQUE: Multidetector CT imaging of the head and cervical spine was performed following the standard protocol without intravenous contrast. Multiplanar CT image reconstructions of the cervical spine were also generated. COMPARISON:  05/18/2020 FINDINGS: CT HEAD FINDINGS Brain: No evidence of acute infarction, hemorrhage, hydrocephalus, extra-axial collection or mass lesion/mass effect. Vascular: No  hyperdense vessel or unexpected calcification. Skull: Normal. Negative for fracture or focal lesion. Sinuses/Orbits: No acute finding. Other: None. CT CERVICAL SPINE FINDINGS Alignment: Within normal limits. Skull base and vertebrae: 7 cervical segments are well visualized. Vertebral body height is well maintained. No acute fracture or acute facet abnormality is noted. Soft tissues and spinal canal: Surrounding soft tissue structures show no acute abnormality. Upper chest: Visualized lung apices are within normal limits. Other: None IMPRESSION: CT of the head: No acute intracranial abnormality noted. CT of the cervical spine: No acute abnormality  is seen. Electronically Signed   By: Inez Catalina M.D.   On: 07/29/2020 21:08   CT Cervical Spine Wo Contrast  Result Date: 08/06/2020 CLINICAL DATA:  Head trauma, minor. Neck trauma. Additional provided: Fall this morning, patient reports head and neck pain. EXAM: CT HEAD WITHOUT CONTRAST CT CERVICAL SPINE WITHOUT CONTRAST TECHNIQUE: Multidetector CT imaging of the head and cervical spine was performed following the standard protocol without intravenous contrast. Multiplanar CT image reconstructions of the cervical spine were also generated. COMPARISON:  Brain MRI 09/04/2014. Prior head CT examinations 07/29/2020 and earlier. Cervical spine CT 07/29/2020. FINDINGS: CT HEAD FINDINGS Brain: Mild cerebral and cerebellar atrophy. There is no acute intracranial hemorrhage. No demarcated cortical infarct. No extra-axial fluid collection. No evidence of intracranial mass. No midline shift. Redemonstrated partially empty and gslightly expanded sella turcica. Vascular: No hyperdense vessel.  Atherosclerotic calcifications. Skull: Normal. Negative for fracture or focal lesion. Sinuses/Orbits: Visualized orbits show no acute finding. No significant paranasal sinus disease at the imaged levels. CT CERVICAL SPINE FINDINGS Alignment: Straightening of the expected cervical lordosis.  Cervical levocurvature. No significant spondylolisthesis. Skull base and vertebrae: The basion-dental and atlanto-dental intervals are maintained. Soft tissues and spinal canal: No prevertebral soft tissue swelling or visible canal hematoma. Disc levels: No more than mild disc space narrowing at any level. Shallow multilevel disc bulges. Upper chest: No consolidation within the imaged lung apices. No visible pneumothorax. IMPRESSION: CT head: 1. No evidence of acute intracranial abnormality. 2. Mild generalized atrophy of the brain, stable. CT cervical spine: 1. No evidence of acute fracture to the cervical spine. 2. Nonspecific straightening of the expected cervical lordosis. 3. Cervical levocurvature, possibly positional. 4. Cervical spondylosis as described. Electronically Signed   By: Kellie Simmering DO   On: 08/06/2020 14:38   CT Cervical Spine Wo Contrast  Result Date: 07/29/2020 CLINICAL DATA:  Recent fall with headaches and facial pain, initial encounter EXAM: CT HEAD WITHOUT CONTRAST CT CERVICAL SPINE WITHOUT CONTRAST TECHNIQUE: Multidetector CT imaging of the head and cervical spine was performed following the standard protocol without intravenous contrast. Multiplanar CT image reconstructions of the cervical spine were also generated. COMPARISON:  05/18/2020 FINDINGS: CT HEAD FINDINGS Brain: No evidence of acute infarction, hemorrhage, hydrocephalus, extra-axial collection or mass lesion/mass effect. Vascular: No hyperdense vessel or unexpected calcification. Skull: Normal. Negative for fracture or focal lesion. Sinuses/Orbits: No acute finding. Other: None. CT CERVICAL SPINE FINDINGS Alignment: Within normal limits. Skull base and vertebrae: 7 cervical segments are well visualized. Vertebral body height is well maintained. No acute fracture or acute facet abnormality is noted. Soft tissues and spinal canal: Surrounding soft tissue structures show no acute abnormality. Upper chest: Visualized lung apices  are within normal limits. Other: None IMPRESSION: CT of the head: No acute intracranial abnormality noted. CT of the cervical spine: No acute abnormality is seen. Electronically Signed   By: Inez Catalina M.D.   On: 07/29/2020 21:08   DG Chest Portable 1 View  Result Date: 08/06/2020 CLINICAL DATA:  Cough. EXAM: PORTABLE CHEST 1 VIEW COMPARISON:  May 18, 2020. FINDINGS: The heart size and mediastinal contours are within normal limits. Both lungs are clear. The visualized skeletal structures are unremarkable. IMPRESSION: No active disease. Electronically Signed   By: Marijo Conception M.D.   On: 08/06/2020 16:42    Assessment/Plan 1. Chronic bilateral low back pain with bilateral sciatica Chronic.Has had frequent falls. - continue on current regimen.referred to pain management but has declined. - Drug Screen 10  W/Conf, Se - oxyCODONE (ROXICODONE) 15 MG immediate release tablet; Take 1 tablet (15 mg total) by mouth every 8 (eight) hours as needed for pain.  Dispense: 90 tablet; Refill: 0  2. Chronic pain syndrome Unable to obtain urine specimen. - Drug Screen 10 W/Conf, Se - oxyCODONE (ROXICODONE) 15 MG immediate release tablet; Take 1 tablet (15 mg total) by mouth every 8 (eight) hours as needed for pain.  Dispense: 90 tablet; Refill: 0  3. Fibromyalgia Chronic  - Drug Screen 10 W/Conf, Se - oxyCODONE (ROXICODONE) 15 MG immediate release tablet; Take 1 tablet (15 mg total) by mouth every 8 (eight) hours as needed for pain.  Dispense: 90 tablet; Refill: 0  4. Unsteady gait On scooter brought from a friend.awaiting own power wheelchair ordered. - Fall and safety precautions   5. Frequent falls Has had multiple ED visit for falls. Recommended high level of care but continues to persist on living in her own. - CBC with Differential/Platelet  6. Hypomagnesemia Mg 1.5 on latest ED visit.will recheck level  - Magnesium  7. Hypokalemia K_ was 2.8 on latest ED visit   - continue on  Potassium chloride  - BMP with eGFR([facility])   8. Severe episode of recurrent major depressive disorder, without psychotic features ([place]) Mood stable. - continue on Amitriptyline and citalopram  - amitriptyline (ELAVIL) 150 MG tablet; Take 1 tablet (150 mg total) by mouth at bedtime.  Dispense: 90 tablet; Refill: 1 - citalopram (CELEXA) 40 MG tablet; Take 0.5 tablets (20 mg total) by mouth daily.  Dispense: 45 tablet; Refill: 1  9. Migraine without aura and without status migrainosus, not intractable Chronic  - continue on Imitrex  - SUMAtriptan (IMITREX) 25 MG tablet; TAKE ONE TABLET BY MOUTH ONCE FOR HEADACHE. MAY REPEAT IN 2 HOURS IF HEADACHE PERSISTS OR RECURS.  Dispense: 9 tablet; Refill: 0  10.Urine incontinence  Request incontinent adult pull ups and peri-care supplies to be faxed to [facility].   Family/ staff Communication: Reviewed plan of care with patient  Labs/tests ordered:  - CBC with Differential/Platelet - BMP with eGFR([facility])  - Magnesium  Next Appointment: Has appointment 10/02/2020 for medical management of chronic issues   Sandrea Hughs, NP

## 2020-08-16 NOTE — Patient Instructions (Signed)
Labs done today will call you with results.

## 2020-08-16 NOTE — Telephone Encounter (Signed)
Patient has a scheduled appointment for 08/16/2020 with [name].

## 2020-08-21 ENCOUNTER — Emergency Department (HOSPITAL_COMMUNITY): Payer: Medicare HMO

## 2020-08-21 ENCOUNTER — Emergency Department (HOSPITAL_COMMUNITY)
Admission: EM | Admit: 2020-08-21 | Discharge: 2020-08-21 | Disposition: A | Discharge status: Home / Self Care | Payer: Medicare HMO | Attending: Emergency Medicine | Admitting: Emergency Medicine

## 2020-08-21 ENCOUNTER — Encounter (HOSPITAL_COMMUNITY): Payer: Self-pay | Admitting: Emergency Medicine

## 2020-08-21 DIAGNOSIS — I110 Hypertensive heart disease with heart failure: Secondary | ICD-10-CM | POA: Diagnosis not present

## 2020-08-21 DIAGNOSIS — M546 Pain in thoracic spine: Secondary | ICD-10-CM | POA: Insufficient documentation

## 2020-08-21 DIAGNOSIS — W1839XA Other fall on same level, initial encounter: Secondary | ICD-10-CM | POA: Insufficient documentation

## 2020-08-21 DIAGNOSIS — Z79899 Other long term (current) drug therapy: Secondary | ICD-10-CM | POA: Insufficient documentation

## 2020-08-21 DIAGNOSIS — M545 Low back pain, unspecified: Secondary | ICD-10-CM | POA: Insufficient documentation

## 2020-08-21 DIAGNOSIS — Y9389 Activity, other specified: Secondary | ICD-10-CM | POA: Diagnosis not present

## 2020-08-21 DIAGNOSIS — R519 Headache, unspecified: Secondary | ICD-10-CM | POA: Insufficient documentation

## 2020-08-21 DIAGNOSIS — Y9289 Other specified places as the place of occurrence of the external cause: Secondary | ICD-10-CM | POA: Insufficient documentation

## 2020-08-21 DIAGNOSIS — J45991 Cough variant asthma: Secondary | ICD-10-CM | POA: Diagnosis not present

## 2020-08-21 DIAGNOSIS — S199XXA Unspecified injury of neck, initial encounter: Secondary | ICD-10-CM | POA: Diagnosis not present

## 2020-08-21 DIAGNOSIS — S161XXA Strain of muscle, fascia and tendon at neck level, initial encounter: Secondary | ICD-10-CM | POA: Diagnosis not present

## 2020-08-21 DIAGNOSIS — M25512 Pain in left shoulder: Secondary | ICD-10-CM | POA: Diagnosis not present

## 2020-08-21 DIAGNOSIS — Y999 Unspecified external cause status: Secondary | ICD-10-CM | POA: Insufficient documentation

## 2020-08-21 DIAGNOSIS — I509 Heart failure, unspecified: Secondary | ICD-10-CM | POA: Insufficient documentation

## 2020-08-21 DIAGNOSIS — G9009 Other idiopathic peripheral autonomic neuropathy: Secondary | ICD-10-CM | POA: Insufficient documentation

## 2020-08-21 DIAGNOSIS — W19XXXA Unspecified fall, initial encounter: Secondary | ICD-10-CM

## 2020-08-21 DIAGNOSIS — R52 Pain, unspecified: Secondary | ICD-10-CM | POA: Diagnosis not present

## 2020-08-21 MED ORDER — LIDOCAINE 5 % EX PTCH: 1.0000 | MEDICATED_PATCH | CUTANEOUS | 0 refills | Status: AC

## 2020-08-21 MED ORDER — POLYETHYLENE GLYCOL 3350 17 G PO PACK: 17.0000 g | PACK | Freq: Every day | ORAL | 0 refills | Status: DC

## 2020-08-21 MED ORDER — ACETAMINOPHEN 325 MG PO TABS
650.0000 mg | ORAL_TABLET | Freq: Once | ORAL | Status: AC
  Administered 2020-08-21: 650 mg via ORAL
  Filled 2020-08-21: qty 2

## 2020-08-21 NOTE — ED Provider Notes (Signed)
  Face-to-face evaluation   History: Patient presents for evaluation of fall she states she was sitting, leaned over to pick up keys and fell onto her left side, injuring her left chest and left arm.  She also complains of headache and neck pain after the fall.  She has had multiple similar falls, multiple ED evaluations, and recently saw her PCP in follow-up.  She states that she has not had a bowel movement for 30 days even though she is still able to eat.  She is concerned that no one knows why she has trouble with her balance, difficulty stooling, and while she continues to fall.  She states she called EMS to bring her here.  She denies other concurrent illnesses.  Physical exam: Elderly female who appears contracted and somewhat depressed.  She has improved mobility when distracted.  She has tenderness of the head, neck, left arm and left chest wall.  No dysarthria or aphasia.  Medical screening examination/treatment/procedure(s) were conducted as a shared visit with non-physician practitioner(s) and myself.  I personally evaluated the patient during the encounter   Mancel Bale, MD 08/22/20 [phone]

## 2020-08-21 NOTE — ED Triage Notes (Signed)
Per EMS-mechanical fall at home-was reaching for something and fell out of her mechanical chair-fell on left side-complaining of left shoulder and left head pain

## 2020-08-21 NOTE — Discharge Instructions (Addendum)
You came to the emergency department today to be evaluated for your injuries after suffering a fall.  Your x-ray of your left shoulder showed no broken bones or locations.  The x-ray of your back showed no broken bones or locations.  CT scan of your head was unremarkable and showed no acute problems.  The CT scan of your neck showed no acute problems.  It did show some chronic changes.  Please follow-up with your primary care provider to discuss his referral to orthopedics as necessary.  Prescribed the lidocaine patches for your pain.  I have also prescribed you MiraLAX for your constipation.  Please follow-up with your primary care provider about this.  Get help right away if: You have a new injury and your pain is worse or different. You feel numb or you have tingling in the painful area. Numbness or tingling to your extremities Weakness your extremities Bowel or bladder dysfunction

## 2020-08-21 NOTE — ED Notes (Signed)
Pt discharged, rx sent to pharmacy, advised to follow-up w PCP. Pt did not have any questions

## 2020-08-21 NOTE — ED Provider Notes (Signed)
[facility]-EMERGENCY DEPT Provider Note   CSN: [id number] Arrival date & time: 08/21/20  1015     History Chief Complaint  Patient presents with  . Fall    Melinda [name] is a 70 y.o. female with a history of hypertension, fibromyalgia, asthma, chronic pain syndrome, congestive heart failure, peripheral neuropathy.  Patient presents today for suffering a mechanical fall.  Patient reports that fall occurred just prior to her arrival at hospital.  Patient fell at ground level.  Reports hitting her head.  No loss of consciousness.  At present patient complains of pain to her left shoulder, neck and back.  Patient rates her pain as an 8/10, pain is constant, worse with movement, no alleviating factors.  Patient states that she was bending over to pick up her keys when she lost her balance and fell.  Patient reports ending on her left side.  Patient reports that she did hit her head.  Patient was unable to get herself off the floor after falling.  Patient called EMS who assisted her from the floor.    She denies taking any blood thinners.  Patient denies any new numbness or tingling to her extremities; does have history of neuropathy and reports that she has has numbness and tingling related to that in her hands and feet prior to the fall.  Patient denies any weakness to her extremities, anesthesia, bowel or bladder dysfunction, visual disturbance, lightheadedness, dizziness, headaches.    Patient reports that she suffered a fall earlier this month, was assessed at the emergency department and also by her primary care provider.    HPI     Past Medical History:  Diagnosis Date  . Acute kidney injury (HCC)   . Allergy   . Anxiety   . Arthritis    "99% of my body" (11/11/2016)  . Asthma   . Bursitis of both hips   . Cataract   . Cervical scoliosis   . Chronic back pain    "all over my back" (11/11/2016)  . Depressive disorder, not elsewhere classified   .  Enthesopathy of hip region   . Environmental allergies    "I take Claritin qd; 365 days/year" (11/11/2016)  . Essential hypertension, benign   . Fibromyalgia   . Frequent falls   . GERD (gastroesophageal reflux disease)   . Headache    "at least 1/wk; may last for 2 days or so" (11/11/2016)  . History of blood transfusion 1985   w/hysterectomy  . Lumbar stenosis   . Migraine    "a few/month" (11/11/2016)  . Muscle weakness (generalized)   . Myalgia and myositis, unspecified   . Osteoporosis   . Other abnormal blood chemistry   . Other malaise and fatigue   . Raynaud's syndrome   . Sciatic nerve pain, left   . Sciatica   . Unspecified vitamin D deficiency     Patient Active Problem List   Diagnosis Date Noted  . Chronic back pain   . Closed compression fracture of first lumbar vertebra (HCC) 11/02/2019  . Anxiety and depression 11/02/2019  . Hypokalemia 11/02/2019  . Dry eyes, bilateral 10/05/2019  . Mixed hyperlipidemia 10/05/2019  . Lower extremity weakness 09/18/2019  . Migraine headache 09/18/2019  . Acute kidney injury (HCC) 05/10/2019  . Near syncope 05/09/2019  . Fall at home, initial encounter 05/09/2019  . History of 2019 novel coronavirus disease (COVID-19) 05/09/2019  . Anemia of chronic disease 05/09/2019  . Acute on chronic renal insufficiency  05/09/2019  . COVID-19 virus infection 10/12/2018  . Polyneuropathy in diseases classified elsewhere (HCC) 10/04/2018  . Prediabetes 09/12/2017  . High risk medication use 09/12/2017  . Frequent falls 09/12/2017  . Chronic pain syndrome 08/14/2017  . Gastroesophageal reflux disease 08/14/2017  . Cough variant asthma 03/14/2017  . Pulmonary hypertension (HCC) 03/12/2017  . Right-sided chest pain 11/11/2016  . Asthma 11/11/2016  . Atypical chest pain   . Costochondritis   . Chronic congestive heart failure (HCC)   . MDD (major depressive disorder), recurrent episode (HCC) 05/23/2015  . Allergic rhinitis 11/03/2014   . Paresthesia 09/08/2014  . Peripheral neuropathy 07/14/2014  . Morbid obesity due to excess calories (HCC) 07/14/2014  . Pain in limb 07/13/2014  . Abnormality of gait 07/13/2014  . Fibromyalgia 06/13/2014  . Spinal stenosis 01/27/2013  . HTN (hypertension) 11/30/2012    Past Surgical History:  Procedure Laterality Date  . ABDOMINAL HYSTERECTOMY  1985  . CESAREAN SECTION  1976; 1979  . IR KYPHO LUMBAR INC FX REDUCE BONE BX UNI/BIL CANNULATION INC/IMAGING  11/04/2019  . SHOULDER OPEN ROTATOR CUFF REPAIR Left      OB History   No obstetric history on file.     Family History  Problem Relation Age of Onset  . Dementia Mother   . Heart disease Mother   . Hypertension Mother   . Diabetes Mother   . Diabetes Sister   . Cancer Sister        lung  . Cancer Other        Nepher (Mothers side)  . Diabetes Maternal Grandmother   . Arthritis Maternal Grandmother   . Diabetes Cousin        Mother's side  . Arthritis Sister   . Arthritis Cousin        Mother's side   . Colon cancer Neg Hx   . Esophageal cancer Neg Hx   . Liver cancer Neg Hx   . Pancreatic cancer Neg Hx   . Rectal cancer Neg Hx   . Stomach cancer Neg Hx     Social History   Tobacco Use  . Smoking status: Never Smoker  . Smokeless tobacco: Never Used  Vaping Use  . Vaping Use: Never used  Substance Use Topics  . Alcohol use: No    Alcohol/week: 0.0 standard drinks  . Drug use: No    Home Medications Prior to Admission medications   Medication Sig Start Date End Date Taking? Authorizing Provider  acetaminophen (TYLENOL) 500 MG tablet Take 1,000 mg by mouth every evening.     [provider]  amitriptyline (ELAVIL) 150 MG tablet Take 1 tablet (150 mg total) by mouth at bedtime. 08/16/20   [name], [name] C, NP  ascorbic acid (VITAMIN C) 500 MG tablet Take 1 tablet (500 mg total) by mouth 2 (two) times daily. 09/26/19   Margit Hanks, MD  aspirin EC 81 MG tablet Take 81 mg by mouth daily.     [provider]  carboxymethylcellulose (REFRESH PLUS) 0.5 % SOLN Place 2 drops into both eyes 2 (two) times daily as needed (dry eyes).     [provider]  citalopram (CELEXA) 20 MG tablet TAKE 1 TABLET BY MOUTH EVERY DAY 06/28/20   [name], [name] C, NP  citalopram (CELEXA) 40 MG tablet Take 0.5 tablets (20 mg total) by mouth daily. 08/16/20   [name], [name] C, NP  [employer] D3 50 MCG (2000 UT) CAPS TAKE 1 CAPSULE BY MOUTH EVERY DAY  11/08/19   [name], [name] C, NP  fluticasone (FLONASE) 50 MCG/ACT nasal spray Place 2 sprays into both nostrils daily as needed for allergies or rhinitis. 02/23/20   [name], [name] C, NP  furosemide (LASIX) 40 MG tablet Take 1 tablet (40 mg total) by mouth daily. 04/03/20   [name], [name] C, NP  loratadine (CLARITIN) 10 MG tablet TAKE 1 TABLET BY MOUTH EVERY DAY 04/09/20   [name], [name] C, NP  metoprolol tartrate (LOPRESSOR) 50 MG tablet TAKE ONE TABLET BY MOUTH TWICE DAILY FOR BLOOD PRESSURE 02/23/20   [name], [name] C, NP  oxyCODONE (ROXICODONE) 15 MG immediate release tablet Take 1 tablet (15 mg total) by mouth every 8 (eight) hours as needed for pain. 08/16/20   [name], [name] C, NP  potassium chloride (KLOR-CON) 10 MEQ tablet Take 1 tablet (10 mEq total) by mouth daily for 5 days. 08/06/20 08/11/20  Jeannie Fend, PA-C  SUMAtriptan (IMITREX) 25 MG tablet TAKE ONE TABLET BY MOUTH ONCE FOR HEADACHE. MAY REPEAT IN 2 HOURS IF HEADACHE PERSISTS OR RECURS. 08/16/20   [name], [name] C, NP  vitamin B-12 (CYANOCOBALAMIN) 500 MCG tablet Take 1 tablet (500 mcg total) by mouth daily. 09/26/19   Margit Hanks, MD  zinc gluconate 50 MG tablet Take 50 mg by mouth daily.    [provider]    Allergies    Patient has no known allergies.  Review of Systems   Review of Systems  Constitutional: Negative for chills and fever.  Eyes: Negative for visual disturbance.  Respiratory: Negative for shortness of breath.   Cardiovascular: Negative for chest pain.   Gastrointestinal: Negative for abdominal pain, nausea and vomiting.  Genitourinary: Negative for difficulty urinating.  Musculoskeletal: Positive for back pain, myalgias and neck pain.  Skin: Negative for color change and rash.  Neurological: Negative for dizziness, tremors, seizures, syncope, facial asymmetry, speech difficulty, weakness, light-headedness, numbness and headaches.  Psychiatric/Behavioral: Negative for confusion.    Physical Exam Updated Vital Signs BP 99/64 (BP Location: Right Arm)   Pulse 71   Temp 97.8 F (36.6 C) (Oral)   Resp 16   SpO2 100%   Physical Exam Vitals and nursing note reviewed.  Constitutional:      General: She is not in acute distress.    Appearance: She is not ill-appearing, toxic-appearing or diaphoretic.  Eyes:     General: No scleral icterus.       Right eye: No discharge.        Left eye: No discharge.     Extraocular Movements: Extraocular movements intact.     Pupils: Pupils are equal, round, and reactive to light.  Cardiovascular:     Rate and Rhythm: Normal rate.  Pulmonary:     Effort: Pulmonary effort is normal.  Chest:     Chest wall: No deformity or tenderness.     Comments: No chest wall tenderness or deformity Abdominal:     General: There is no distension.     Palpations: Abdomen is soft.     Tenderness: There is no abdominal tenderness.  Musculoskeletal:     Right shoulder: No swelling, deformity, effusion, laceration, tenderness, bony tenderness or crepitus. Normal range of motion. Normal strength.     Left shoulder: Tenderness and bony tenderness present. No swelling, deformity, effusion, laceration or crepitus. Decreased range of motion.     Right upper arm: Normal.     Left upper arm: No swelling, edema, deformity, lacerations, tenderness or bony tenderness.  Right elbow: Normal.     Left elbow: Normal.     Right forearm: Normal.     Left forearm: Normal.     Right wrist: Normal.     Left wrist: No swelling,  deformity, effusion, lacerations, tenderness, bony tenderness, snuff box tenderness or crepitus. Normal range of motion. Normal pulse.     Right hand: No swelling, deformity, lacerations, tenderness or bony tenderness. Normal range of motion. Normal strength. Normal sensation. Normal capillary refill. Normal pulse.     Left hand: No swelling, deformity, lacerations, tenderness or bony tenderness. Normal range of motion. Normal strength. Normal sensation. Normal capillary refill. Normal pulse.     Cervical back: Normal range of motion and neck supple. Tenderness present. No swelling, edema, deformity, erythema, signs of trauma, lacerations, rigidity, spasms, torticollis or bony tenderness. No pain with movement. Normal range of motion.     Thoracic back: Tenderness present. No swelling, edema, deformity, signs of trauma, lacerations, spasms or bony tenderness.     Lumbar back: No swelling, edema, deformity, signs of trauma, lacerations, spasms, tenderness or bony tenderness.     Comments: Tenderness to left trapezius and sternocleidomastoid muscles  Tenderness to left-sided paraspinous muscles of thoracic and lumbar back  No midline tenderness, step-off, or deformity  Left shoulder : tenderness to humeral head, proximal humeral shaft, and trapezius, no bruising, deformity, contusion noted.  Patient has decreased range of motion however was able to pick up for coat without difficulty or complaints of pain  Skin:    General: Skin is warm and dry.  Neurological:     General: No focal deficit present.     Mental Status: She is alert.     GCS: GCS eye subscore is 4. GCS verbal subscore is 5. GCS motor subscore is 6.     Cranial Nerves: No cranial nerve deficit or facial asymmetry.     Motor: No weakness, tremor, seizure activity or pronator drift.     Coordination: Finger-Nose-Finger Test normal.     Comments: CN II-XII intact, equal grip strength, +5 strength to bilateral upper and lower extremities    Patient able to stand and take steps with assistance  Psychiatric:        Behavior: Behavior is cooperative.     ED Results / Procedures / Treatments   Labs (all labs ordered are listed, but only abnormal results are displayed) Labs Reviewed - No data to display  EKG None  Radiology DG Thoracic Spine 2 View  Result Date: 08/21/2020 CLINICAL DATA:  Back pain after fall. EXAM: THORACIC SPINE 2 VIEWS COMPARISON:  August 06, 2020. FINDINGS: There is no evidence of thoracic spine fracture. Alignment is normal. No other significant bone abnormalities are identified. IMPRESSION: Negative. Electronically Signed   By: Lupita Raider M.D.   On: 08/21/2020 13:31   DG Lumbar Spine Complete  Result Date: 08/21/2020 CLINICAL DATA:  Low back pain after fall. EXAM: LUMBAR SPINE - COMPLETE 4+ VIEW COMPARISON:  August 06, 2020. FINDINGS: Status post L1 kyphoplasty. No fracture or spondylolisthesis is noted. Disc spaces are well-maintained. Degenerative changes are seen involving posterior facet joints of L4-5. IMPRESSION: Status post L1 kyphoplasty. No acute abnormality seen in the lumbar spine. Electronically Signed   By: Lupita Raider M.D.   On: 08/21/2020 13:32   CT Head Wo Contrast  Result Date: 08/21/2020 CLINICAL DATA:  Head trauma, minor. Neck trauma. Additional history provided: Mechanical fall at home, left shoulder and left head pain. EXAM: CT  HEAD WITHOUT CONTRAST CT CERVICAL SPINE WITHOUT CONTRAST TECHNIQUE: Multidetector CT imaging of the head and cervical spine was performed following the standard protocol without intravenous contrast. Multiplanar CT image reconstructions of the cervical spine were also generated. COMPARISON:  Prior head CT examinations 08/16/2020 and earlier. CT cervical spine 08/06/2020. FINDINGS: CT HEAD FINDINGS Brain: Mild cerebral and cerebellar atrophy. There is no acute intracranial hemorrhage. No demarcated cortical infarct. No extra-axial fluid collection. No  evidence of intracranial mass. No midline shift. Redemonstrated partially empty and slightly expanded sella turcica. Vascular: No hyperdense vessel.  Atherosclerotic calcifications. Skull: Normal. Negative for fracture or focal lesion. Sinuses/Orbits: Visualized orbits show no acute finding. No significant paranasal sinus disease at the imaged levels. CT CERVICAL SPINE FINDINGS Alignment: Straightening of the expected cervical lordosis. Trace C4-C5, C5-C6 and C7-T1 grade 1 anterolisthesis. Skull base and vertebrae: The basion-dental and atlanto-dental intervals are maintained.No evidence of acute fracture to the cervical spine. Soft tissues and spinal canal: No prevertebral fluid or swelling. No visible canal hematoma. Disc levels: No more than mild disc space narrowing at any level. Multilevel shallow disc bulges. No appreciable high-grade spinal canal stenosis. No significant bony neural foraminal narrowing. Upper chest: No consolidation within the imaged lung apices. No visible pneumothorax. IMPRESSION: CT head: 1. No evidence of acute intracranial abnormality. 2. Stable mild generalized atrophy of the brain. CT cervical spine: 1. No evidence of acute fracture to the cervical spine. 2. Nonspecific straightening of the expected cervical lordosis. 3. Trace grade 1 anterolisthesis at C4-C5, C5-C6 and C7-T1. 4. Cervical spondylosis as described. Electronically Signed   By: Jackey Loge DO   On: 08/21/2020 13:42   CT Cervical Spine Wo Contrast  Result Date: 08/21/2020 CLINICAL DATA:  Head trauma, minor. Neck trauma. Additional history provided: Mechanical fall at home, left shoulder and left head pain. EXAM: CT HEAD WITHOUT CONTRAST CT CERVICAL SPINE WITHOUT CONTRAST TECHNIQUE: Multidetector CT imaging of the head and cervical spine was performed following the standard protocol without intravenous contrast. Multiplanar CT image reconstructions of the cervical spine were also generated. COMPARISON:  Prior head CT  examinations 08/16/2020 and earlier. CT cervical spine 08/06/2020. FINDINGS: CT HEAD FINDINGS Brain: Mild cerebral and cerebellar atrophy. There is no acute intracranial hemorrhage. No demarcated cortical infarct. No extra-axial fluid collection. No evidence of intracranial mass. No midline shift. Redemonstrated partially empty and slightly expanded sella turcica. Vascular: No hyperdense vessel.  Atherosclerotic calcifications. Skull: Normal. Negative for fracture or focal lesion. Sinuses/Orbits: Visualized orbits show no acute finding. No significant paranasal sinus disease at the imaged levels. CT CERVICAL SPINE FINDINGS Alignment: Straightening of the expected cervical lordosis. Trace C4-C5, C5-C6 and C7-T1 grade 1 anterolisthesis. Skull base and vertebrae: The basion-dental and atlanto-dental intervals are maintained.No evidence of acute fracture to the cervical spine. Soft tissues and spinal canal: No prevertebral fluid or swelling. No visible canal hematoma. Disc levels: No more than mild disc space narrowing at any level. Multilevel shallow disc bulges. No appreciable high-grade spinal canal stenosis. No significant bony neural foraminal narrowing. Upper chest: No consolidation within the imaged lung apices. No visible pneumothorax. IMPRESSION: CT head: 1. No evidence of acute intracranial abnormality. 2. Stable mild generalized atrophy of the brain. CT cervical spine: 1. No evidence of acute fracture to the cervical spine. 2. Nonspecific straightening of the expected cervical lordosis. 3. Trace grade 1 anterolisthesis at C4-C5, C5-C6 and C7-T1. 4. Cervical spondylosis as described. Electronically Signed   By: Jackey Loge DO   On: 08/21/2020 13:42  DG Shoulder Left  Result Date: 08/21/2020 CLINICAL DATA:  Left shoulder pain after fall from wheelchair EXAM: LEFT SHOULDER - 2+ VIEW COMPARISON:  Left shoulder radiograph May 18, 2020 FINDINGS: There is no evidence of fracture or dislocation. Mild  degenerative spurring of the lower glenoid and AC joint. Soft tissues are unremarkable. The visualized ribs and lung fields are unremarkable IMPRESSION: No acute osseous findings. Electronically Signed   By: Maudry Mayhew MD   On: 08/21/2020 11:14    Procedures Procedures   Medications Ordered in ED Medications  acetaminophen (TYLENOL) tablet 650 mg (650 mg Oral Given 08/21/20 1236)    ED Course  I have reviewed the triage vital signs and the nursing notes.  Pertinent labs & imaging results that were available during my care of the patient were reviewed by me and considered in my medical decision making (see chart for details).    MDM Rules/Calculators/A&P                           Alert 70 year old female no acute distress, nontoxic-appearing.  Patient presents after suffering a mechanical fall.  Patient reports that she lost her balance when picking up keys and fell to the ground.  Patient reports hitting her head but no loss of consciousness.  Patient denies any new numbness or tingling to her extremities, weakness extremities, saddle anesthesia or bowel or bladder function.  Patient complains of pain to the left shoulder, left side of her head, left-sided neck pain and pain to her thoracic and lumbar back.  Additionally patient complains of constipation x1 months.  Patient has decreased range of motion to left shoulder, tenderness to left humeral head and proximal left humeral shaft no deformity, contusion, bruising or swelling noted.  Patient has no tenderness to spinous process, midline tenderness, step-off or trauma noted to cervical, thoracic, or lumbar back.  Patient has tenderness to left trapezius, sternocleidomastoid muscles, left-sided paraspinous muscles of thoracic and lumbar back.    Patient given Tylenol to help manage her pain.    X-ray of left shoulder, lower back, thoracic back obtained.  No evidence of fracture or dislocation.  Left shoulder.  No evidence of thoracic  spine fracture, wound is normal, no other significant abnormalities identified.  No abnormality seen in the lumbar spine.  noncontrast CT head showed no acute intracranial abnormality.  Noncontrast CT cervical spine showed no evidence of acute fracture.  Nonspecific stranding of the expected cervical lordosis, and trace grade 1 anterolisthesis at C4-C5, C5-C6, and C7-T1.  Patient vitals have remained stable.  Patient reported improvement in her pain.  Prescribed lidocaine patch to use as needed for patient's pain.  Perccribed MiraLAX to help with patient's constipation.  Will have patient follow-up with her primary care provider.  Discussed results, findings, treatment and follow up. Patient advised of return precautions. Patient verbalized understanding and agreed with plan.  Patient was discussed with and evaluated by Dr. Effie Shy.   Final Clinical Impression(s) / ED Diagnoses Final diagnoses:  Fall, initial encounter  Acute pain of left shoulder  Cervical muscle strain, initial encounter    Rx / DC Orders ED Discharge Orders         Ordered    lidocaine (LIDODERM) 5 %  Every 24 hours        08/21/20 1414    polyethylene glycol (MIRALAX / GLYCOLAX) 17 g packet  Daily        08/21/20 1414  Haskel Schroeder, PA-C 08/22/20 0045    Mancel Bale, MD 08/22/20 [phone]

## 2020-08-21 NOTE — ED Notes (Signed)
Pt states she needs a ride home- states she was told hospital provides free taxi rides or ambulance rides home. Advised pt that the hospital usually doesn't do that unless pt returning to SNF/ALF, but I would check w charge RN. Spoke w Consulting civil engineer and was told pt needs to attempt to call family/friend and arrange ride home, we cannot provide ambulance/cab home

## 2020-08-22 LAB — OPIATES, SERUM
6-ACETYL MORPHINE: NOT DETECTED mcg/L
CODEINE: NOT DETECTED mcg/L
HYDROCODONE, SERUM: NOT DETECTED mcg/L
HYDROMORPHONE: NOT DETECTED mcg/L (ref 10–30)
MORPHINE, SERUM: NOT DETECTED mcg/L
OXYCODONE: 7 mcg/L — ABNORMAL LOW (ref 10–40)
OXYMORPHONE: NOT DETECTED mcg/L

## 2020-08-22 LAB — BASIC METABOLIC PANEL WITH GFR
BUN: 9 mg/dL (ref 7–25)
CO2: 28 mmol/L (ref 20–32)
Calcium: 9.5 mg/dL (ref 8.6–10.4)
Chloride: 98 mmol/L (ref 98–110)
Creat: 0.92 mg/dL (ref 0.50–0.99)
GFR, Est African American: 74 mL/min/{1.73_m2} (ref >=60)
GFR, Est Non African American: 64 mL/min/{1.73_m2} (ref >=60)
Glucose, Bld: 109 mg/dL — ABNORMAL HIGH (ref 65–99)
Potassium: 3.3 mmol/L — ABNORMAL LOW (ref 3.5–5.3)
Sodium: 140 mmol/L (ref 135–146)

## 2020-08-22 LAB — CBC WITH DIFFERENTIAL/PLATELET
Absolute Monocytes: 1003 cells/uL — ABNORMAL HIGH (ref 200–950)
Basophils Absolute: 79 cells/uL (ref 0–200)
Basophils Relative: 1.2 %
Eosinophils Absolute: 59 cells/uL (ref 15–500)
Eosinophils Relative: 0.9 %
HCT: 38.3 % (ref 35.0–45.0)
Hemoglobin: 12.5 g/dL (ref 11.7–15.5)
Lymphs Abs: 2244 cells/uL (ref 850–3900)
MCH: 27.4 pg (ref 27.0–33.0)
MCHC: 32.6 g/dL (ref 32.0–36.0)
MCV: 83.8 fL (ref 80.0–100.0)
MPV: 12.1 fL (ref 7.5–12.5)
Monocytes Relative: 15.2 %
Neutro Abs: 3214 cells/uL (ref 1500–7800)
Neutrophils Relative %: 48.7 %
Platelets: 354 10*3/uL (ref 140–400)
RBC: 4.57 10*6/uL (ref 3.80–5.10)
RDW: 13.7 % (ref 11.0–15.0)
Total Lymphocyte: 34 %
WBC: 6.6 10*3/uL (ref 3.8–10.8)

## 2020-08-22 LAB — MAGNESIUM: Magnesium: 1.5 mg/dL (ref 1.5–2.5)

## 2020-08-24 ENCOUNTER — Other Ambulatory Visit: Payer: Self-pay

## 2020-08-24 MED ORDER — POTASSIUM CHLORIDE ER 10 MEQ PO TBCR: 10.0000 meq | EXTENDED_RELEASE_TABLET | Freq: Every day | ORAL | 0 refills | Status: DC

## 2020-08-27 ENCOUNTER — Other Ambulatory Visit: Payer: Self-pay

## 2020-08-27 DIAGNOSIS — E876 Hypokalemia: Secondary | ICD-10-CM

## 2020-08-28 DIAGNOSIS — G629 Polyneuropathy, unspecified: Secondary | ICD-10-CM | POA: Diagnosis not present

## 2020-08-28 DIAGNOSIS — M6281 Muscle weakness (generalized): Secondary | ICD-10-CM | POA: Diagnosis not present

## 2020-08-28 DIAGNOSIS — M5441 Lumbago with sciatica, right side: Secondary | ICD-10-CM | POA: Diagnosis not present

## 2020-08-28 DIAGNOSIS — M5442 Lumbago with sciatica, left side: Secondary | ICD-10-CM | POA: Diagnosis not present

## 2020-08-28 DIAGNOSIS — R296 Repeated falls: Secondary | ICD-10-CM | POA: Diagnosis not present

## 2020-08-28 DIAGNOSIS — F329 Major depressive disorder, single episode, unspecified: Secondary | ICD-10-CM | POA: Diagnosis not present

## 2020-08-28 DIAGNOSIS — G894 Chronic pain syndrome: Secondary | ICD-10-CM | POA: Diagnosis not present

## 2020-08-28 DIAGNOSIS — Z743 Need for continuous supervision: Secondary | ICD-10-CM | POA: Diagnosis not present

## 2020-08-28 DIAGNOSIS — M797 Fibromyalgia: Secondary | ICD-10-CM | POA: Diagnosis not present

## 2020-09-04 ENCOUNTER — Other Ambulatory Visit: Payer: Self-pay | Admitting: Family

## 2020-09-04 DIAGNOSIS — M5441 Lumbago with sciatica, right side: Secondary | ICD-10-CM | POA: Diagnosis not present

## 2020-09-04 DIAGNOSIS — G894 Chronic pain syndrome: Secondary | ICD-10-CM | POA: Diagnosis not present

## 2020-09-04 DIAGNOSIS — R296 Repeated falls: Secondary | ICD-10-CM | POA: Diagnosis not present

## 2020-09-04 DIAGNOSIS — F329 Major depressive disorder, single episode, unspecified: Secondary | ICD-10-CM | POA: Diagnosis not present

## 2020-09-04 DIAGNOSIS — M5442 Lumbago with sciatica, left side: Secondary | ICD-10-CM | POA: Diagnosis not present

## 2020-09-04 DIAGNOSIS — G629 Polyneuropathy, unspecified: Secondary | ICD-10-CM | POA: Diagnosis not present

## 2020-09-04 DIAGNOSIS — M6281 Muscle weakness (generalized): Secondary | ICD-10-CM | POA: Diagnosis not present

## 2020-09-04 DIAGNOSIS — Z743 Need for continuous supervision: Secondary | ICD-10-CM | POA: Diagnosis not present

## 2020-09-04 DIAGNOSIS — M797 Fibromyalgia: Secondary | ICD-10-CM | POA: Diagnosis not present

## 2020-09-04 NOTE — Telephone Encounter (Signed)
Received refill Request from pharmacy Pended Rx and sent to [facility] for approval.

## 2020-09-06 ENCOUNTER — Encounter (HOSPITAL_COMMUNITY): Payer: Self-pay

## 2020-09-06 ENCOUNTER — Emergency Department (HOSPITAL_COMMUNITY): Payer: Medicare HMO

## 2020-09-06 ENCOUNTER — Emergency Department (HOSPITAL_COMMUNITY)
Admission: EM | Admit: 2020-09-06 | Discharge: 2020-09-10 | Disposition: A | Discharge status: Home / Self Care | Payer: Medicare HMO | Attending: Emergency Medicine | Admitting: Emergency Medicine

## 2020-09-06 DIAGNOSIS — Z7982 Long term (current) use of aspirin: Secondary | ICD-10-CM | POA: Insufficient documentation

## 2020-09-06 DIAGNOSIS — Z79899 Other long term (current) drug therapy: Secondary | ICD-10-CM | POA: Diagnosis not present

## 2020-09-06 DIAGNOSIS — S4992XA Unspecified injury of left shoulder and upper arm, initial encounter: Secondary | ICD-10-CM | POA: Diagnosis not present

## 2020-09-06 DIAGNOSIS — R296 Repeated falls: Secondary | ICD-10-CM | POA: Diagnosis not present

## 2020-09-06 DIAGNOSIS — S199XXA Unspecified injury of neck, initial encounter: Secondary | ICD-10-CM | POA: Diagnosis not present

## 2020-09-06 DIAGNOSIS — W01198A Fall on same level from slipping, tripping and stumbling with subsequent striking against other object, initial encounter: Secondary | ICD-10-CM | POA: Diagnosis not present

## 2020-09-06 DIAGNOSIS — S2242XD Multiple fractures of ribs, left side, subsequent encounter for fracture with routine healing: Secondary | ICD-10-CM | POA: Diagnosis not present

## 2020-09-06 DIAGNOSIS — Z20822 Contact with and (suspected) exposure to covid-19: Secondary | ICD-10-CM | POA: Diagnosis not present

## 2020-09-06 DIAGNOSIS — M25512 Pain in left shoulder: Secondary | ICD-10-CM | POA: Insufficient documentation

## 2020-09-06 DIAGNOSIS — Z8616 Personal history of COVID-19: Secondary | ICD-10-CM | POA: Diagnosis not present

## 2020-09-06 DIAGNOSIS — S0990XA Unspecified injury of head, initial encounter: Secondary | ICD-10-CM | POA: Diagnosis not present

## 2020-09-06 DIAGNOSIS — M47812 Spondylosis without myelopathy or radiculopathy, cervical region: Secondary | ICD-10-CM | POA: Diagnosis not present

## 2020-09-06 DIAGNOSIS — R531 Weakness: Secondary | ICD-10-CM | POA: Insufficient documentation

## 2020-09-06 DIAGNOSIS — R519 Headache, unspecified: Secondary | ICD-10-CM | POA: Insufficient documentation

## 2020-09-06 DIAGNOSIS — I509 Heart failure, unspecified: Secondary | ICD-10-CM | POA: Diagnosis not present

## 2020-09-06 DIAGNOSIS — M25522 Pain in left elbow: Secondary | ICD-10-CM | POA: Diagnosis not present

## 2020-09-06 DIAGNOSIS — R52 Pain, unspecified: Secondary | ICD-10-CM | POA: Diagnosis not present

## 2020-09-06 DIAGNOSIS — W19XXXA Unspecified fall, initial encounter: Secondary | ICD-10-CM | POA: Diagnosis not present

## 2020-09-06 DIAGNOSIS — N189 Chronic kidney disease, unspecified: Secondary | ICD-10-CM | POA: Insufficient documentation

## 2020-09-06 DIAGNOSIS — I130 Hypertensive heart and chronic kidney disease with heart failure and stage 1 through stage 4 chronic kidney disease, or unspecified chronic kidney disease: Secondary | ICD-10-CM | POA: Insufficient documentation

## 2020-09-06 DIAGNOSIS — J45909 Unspecified asthma, uncomplicated: Secondary | ICD-10-CM | POA: Insufficient documentation

## 2020-09-06 DIAGNOSIS — M19012 Primary osteoarthritis, left shoulder: Secondary | ICD-10-CM | POA: Diagnosis not present

## 2020-09-06 LAB — COMPREHENSIVE METABOLIC PANEL
ALT: 10 U/L (ref 0–44)
AST: 16 U/L (ref 15–41)
Albumin: 3.8 g/dL (ref 3.5–5.0)
Alkaline Phosphatase: 52 U/L (ref 38–126)
Anion gap: 11 (ref 5–15)
BUN: 8 mg/dL (ref 8–23)
CO2: 30 mmol/L (ref 22–32)
Calcium: 9.4 mg/dL (ref 8.9–10.3)
Chloride: 95 mmol/L — ABNORMAL LOW (ref 98–111)
Creatinine, Ser: 0.92 mg/dL (ref 0.44–1.00)
GFR, Estimated: >60 mL/min (ref >60)
Glucose, Bld: 101 mg/dL — ABNORMAL HIGH (ref 70–99)
Potassium: 2.7 mmol/L — CL (ref 3.5–5.1)
Sodium: 136 mmol/L (ref 135–145)
Total Bilirubin: 1.1 mg/dL (ref 0.3–1.2)
Total Protein: 7.2 g/dL (ref 6.5–8.1)

## 2020-09-06 LAB — CBC WITH DIFFERENTIAL/PLATELET
Abs Immature Granulocytes: 0.04 10*3/uL (ref 0.00–0.07)
Basophils Absolute: 0.1 10*3/uL (ref 0.0–0.1)
Basophils Relative: 1 %
Eosinophils Absolute: 0.1 10*3/uL (ref 0.0–0.5)
Eosinophils Relative: 2 %
HCT: 37.8 % (ref 36.0–46.0)
Hemoglobin: 11.8 g/dL — ABNORMAL LOW (ref 12.0–15.0)
Immature Granulocytes: 1 %
Lymphocytes Relative: 33 %
Lymphs Abs: 2 10*3/uL (ref 0.7–4.0)
MCH: 27.3 pg (ref 26.0–34.0)
MCHC: 31.2 g/dL (ref 30.0–36.0)
MCV: 87.3 fL (ref 80.0–100.0)
Monocytes Absolute: 0.8 10*3/uL (ref 0.1–1.0)
Monocytes Relative: 14 %
Neutro Abs: 2.9 10*3/uL (ref 1.7–7.7)
Neutrophils Relative %: 49 %
Platelets: 456 10*3/uL — ABNORMAL HIGH (ref 150–400)
RBC: 4.33 MIL/uL (ref 3.87–5.11)
RDW: 13.9 % (ref 11.5–15.5)
WBC: 5.9 10*3/uL (ref 4.0–10.5)
nRBC: 0 % (ref 0.0–0.2)

## 2020-09-06 MED ORDER — CITALOPRAM HYDROBROMIDE 10 MG PO TABS
20.0000 mg | ORAL_TABLET | Freq: Every day | ORAL | Status: DC
  Administered 2020-09-07 – 2020-09-10 (×4): 20 mg via ORAL
  Filled 2020-09-06 (×4): qty 2

## 2020-09-06 MED ORDER — FUROSEMIDE 40 MG PO TABS
40.0000 mg | ORAL_TABLET | Freq: Every day | ORAL | Status: DC
  Administered 2020-09-07 – 2020-09-10 (×4): 40 mg via ORAL
  Filled 2020-09-06 (×4): qty 1

## 2020-09-06 MED ORDER — POTASSIUM CHLORIDE 20 MEQ PO PACK
40.0000 meq | PACK | Freq: Every day | ORAL | Status: DC
  Administered 2020-09-06 – 2020-09-09 (×4): 40 meq via ORAL
  Filled 2020-09-06 (×6): qty 2

## 2020-09-06 MED ORDER — POTASSIUM CHLORIDE ER 10 MEQ PO TBCR
10.0000 meq | EXTENDED_RELEASE_TABLET | Freq: Every day | ORAL | Status: DC
  Administered 2020-09-07 – 2020-09-10 (×4): 10 meq via ORAL
  Filled 2020-09-06 (×8): qty 1

## 2020-09-06 MED ORDER — METOPROLOL TARTRATE 25 MG PO TABS
50.0000 mg | ORAL_TABLET | Freq: Two times a day (BID) | ORAL | Status: DC
  Administered 2020-09-06 – 2020-09-08 (×4): 50 mg via ORAL
  Filled 2020-09-06 (×6): qty 2

## 2020-09-06 MED ORDER — SUMATRIPTAN SUCCINATE 25 MG PO TABS
25.0000 mg | ORAL_TABLET | ORAL | Status: DC | PRN
Start: 2020-09-06 — End: 2020-09-10
  Filled 2020-09-06: qty 1

## 2020-09-06 MED ORDER — ASPIRIN EC 81 MG PO TBEC
81.0000 mg | DELAYED_RELEASE_TABLET | Freq: Every day | ORAL | Status: DC
  Administered 2020-09-07 – 2020-09-10 (×4): 81 mg via ORAL
  Filled 2020-09-06 (×4): qty 1

## 2020-09-06 MED ORDER — ACETAMINOPHEN 500 MG PO TABS
1000.0000 mg | ORAL_TABLET | Freq: Every evening | ORAL | Status: DC
  Administered 2020-09-06 – 2020-09-09 (×5): 1000 mg via ORAL
  Filled 2020-09-06 (×5): qty 2

## 2020-09-06 MED ORDER — OXYCODONE HCL 5 MG PO TABS
15.0000 mg | ORAL_TABLET | Freq: Three times a day (TID) | ORAL | Status: DC | PRN
  Administered 2020-09-07 – 2020-09-08 (×2): 15 mg via ORAL
  Filled 2020-09-06 (×2): qty 3

## 2020-09-06 MED ORDER — AMITRIPTYLINE HCL 25 MG PO TABS
150.0000 mg | ORAL_TABLET | Freq: Every day | ORAL | Status: DC
  Administered 2020-09-06 – 2020-09-09 (×4): 150 mg via ORAL
  Filled 2020-09-06 (×4): qty 6

## 2020-09-06 MED ORDER — ACETAMINOPHEN 500 MG PO TABS
1000.0000 mg | ORAL_TABLET | Freq: Once | ORAL | Status: AC
  Administered 2020-09-06: 1000 mg via ORAL
  Filled 2020-09-06 (×2): qty 2

## 2020-09-06 MED ORDER — ONDANSETRON HCL 4 MG PO TABS: 4.0000 mg | ORAL_TABLET | Freq: Three times a day (TID) | ORAL | Status: DC | PRN

## 2020-09-06 NOTE — ED Triage Notes (Signed)
Pt presents via EMS from home with c/o fall that occurred today. Pt did hit her head, no contusions or injuries noted. Pt has a hx of frequent falls. No LOC with the fall.

## 2020-09-06 NOTE — ED Provider Notes (Signed)
[name]-EMERGENCY DEPT Provider Note   CSN: [id number] Arrival date & time: 09/06/20  1028     History Chief Complaint  Patient presents with  . Fall    Melinda [name] is a 70 y.o. female.   Fall This is a new problem. The current episode started 12 to 24 hours ago. The problem occurs daily. The problem has not changed since onset.Associated symptoms include headaches. Pertinent negatives include no chest pain and no shortness of breath. Associated symptoms comments: Shoulder pain and head pain . Nothing aggravates the symptoms. Nothing relieves the symptoms. She has tried nothing for the symptoms. The treatment provided no relief.    HPI     Past Medical History:  Diagnosis Date  . Acute kidney injury (HCC)   . Allergy   . Anxiety   . Arthritis    "99% of my body" (11/11/2016)  . Asthma   . Bursitis of both hips   . Cataract   . Cervical scoliosis   . Chronic back pain    "all over my back" (11/11/2016)  . Depressive disorder, not elsewhere classified   . Enthesopathy of hip region   . Environmental allergies    "I take Claritin qd; 365 days/year" (11/11/2016)  . Essential hypertension, benign   . Fibromyalgia   . Frequent falls   . GERD (gastroesophageal reflux disease)   . Headache    "at least 1/wk; may last for 2 days or so" (11/11/2016)  . History of blood transfusion 1985   w/hysterectomy  . Lumbar stenosis   . Migraine    "a few/month" (11/11/2016)  . Muscle weakness (generalized)   . Myalgia and myositis, unspecified   . Osteoporosis   . Other abnormal blood chemistry   . Other malaise and fatigue   . Raynaud's syndrome   . Sciatic nerve pain, left   . Sciatica   . Unspecified vitamin D deficiency     Patient Active Problem List   Diagnosis Date Noted  . Chronic back pain   . Closed compression fracture of first lumbar vertebra (HCC) 11/02/2019  . Anxiety and depression 11/02/2019  . Hypokalemia 11/02/2019  . Dry  eyes, bilateral 10/05/2019  . Mixed hyperlipidemia 10/05/2019  . Lower extremity weakness 09/18/2019  . Migraine headache 09/18/2019  . Acute kidney injury (HCC) 05/10/2019  . Near syncope 05/09/2019  . Fall at home, initial encounter 05/09/2019  . History of 2019 novel coronavirus disease (COVID-19) 05/09/2019  . Anemia of chronic disease 05/09/2019  . Acute on chronic renal insufficiency 05/09/2019  . COVID-19 virus infection 10/12/2018  . Polyneuropathy in diseases classified elsewhere (HCC) 10/04/2018  . Prediabetes 09/12/2017  . High risk medication use 09/12/2017  . Frequent falls 09/12/2017  . Chronic pain syndrome 08/14/2017  . Gastroesophageal reflux disease 08/14/2017  . Cough variant asthma 03/14/2017  . Pulmonary hypertension (HCC) 03/12/2017  . Right-sided chest pain 11/11/2016  . Asthma 11/11/2016  . Atypical chest pain   . Costochondritis   . Chronic congestive heart failure (HCC)   . MDD (major depressive disorder), recurrent episode (HCC) 05/23/2015  . Allergic rhinitis 11/03/2014  . Paresthesia 09/08/2014  . Peripheral neuropathy 07/14/2014  . Morbid obesity due to excess calories (HCC) 07/14/2014  . Pain in limb 07/13/2014  . Abnormality of gait 07/13/2014  . Fibromyalgia 06/13/2014  . Spinal stenosis 01/27/2013  . HTN (hypertension) 11/30/2012    Past Surgical History:  Procedure Laterality Date  . ABDOMINAL HYSTERECTOMY  1985  .  CESAREAN SECTION  1976; 1979  . IR KYPHO LUMBAR INC FX REDUCE BONE BX UNI/BIL CANNULATION INC/IMAGING  11/04/2019  . SHOULDER OPEN ROTATOR CUFF REPAIR Left      OB History   No obstetric history on file.     Family History  Problem Relation Age of Onset  . Dementia Mother   . Heart disease Mother   . Hypertension Mother   . Diabetes Mother   . Diabetes Sister   . Cancer Sister        lung  . Cancer Other        Nepher (Mothers side)  . Diabetes Maternal Grandmother   . Arthritis Maternal Grandmother   . Diabetes  Cousin        Mother's side  . Arthritis Sister   . Arthritis Cousin        Mother's side   . Colon cancer Neg Hx   . Esophageal cancer Neg Hx   . Liver cancer Neg Hx   . Pancreatic cancer Neg Hx   . Rectal cancer Neg Hx   . Stomach cancer Neg Hx     Social History   Tobacco Use  . Smoking status: Never Smoker  . Smokeless tobacco: Never Used  Vaping Use  . Vaping Use: Never used  Substance Use Topics  . Alcohol use: No    Alcohol/week: 0.0 standard drinks  . Drug use: No    Home Medications Prior to Admission medications   Medication Sig Start Date End Date Taking? Authorizing Provider  acetaminophen (TYLENOL) 500 MG tablet Take 1,000 mg by mouth every evening.    Yes [provider]  amitriptyline (ELAVIL) 150 MG tablet Take 1 tablet (150 mg total) by mouth at bedtime. 08/16/20  Yes [name], [name] C, NP  ascorbic acid (VITAMIN C) 500 MG tablet Take 1 tablet (500 mg total) by mouth 2 (two) times daily. 09/26/19  Yes Margit Hanks, MD  aspirin EC 81 MG tablet Take 81 mg by mouth in the morning and at bedtime.   Yes [provider]  carboxymethylcellulose (REFRESH PLUS) 0.5 % SOLN Place 2 drops into both eyes 2 (two) times daily as needed (dry eyes).    Yes [provider]  citalopram (CELEXA) 20 MG tablet TAKE 1 TABLET BY MOUTH EVERY [name] Patient taking differently: Take 20 mg by mouth daily. 06/28/20  Yes [name], [name] C, NP  [employer] D3 50 MCG (2000 UT) CAPS TAKE 1 CAPSULE BY MOUTH EVERY [name] Patient taking differently: Take 2,000 Units by mouth daily. 11/08/19  Yes [name], [name] C, NP  fluticasone (FLONASE) 50 MCG/ACT nasal spray Place 2 sprays into both nostrils daily as needed for allergies or rhinitis. 02/23/20  Yes [name], [name] C, NP  furosemide (LASIX) 40 MG tablet Take 1 tablet (40 mg total) by mouth daily. 04/03/20  Yes [name], [name] C, NP  lidocaine (LIDODERM) 5 % Place 1 patch onto the skin daily. Remove & Discard patch within 12 hours or as  directed by MD 08/21/20  Yes Haskel Schroeder, PA-C  loratadine (CLARITIN) 10 MG tablet TAKE 1 TABLET BY MOUTH EVERY [name] Patient taking differently: Take 10 mg by mouth daily. 04/09/20  Yes [name], [name] C, NP  metoprolol tartrate (LOPRESSOR) 50 MG tablet TAKE ONE TABLET BY MOUTH TWICE DAILY FOR BLOOD PRESSURE Patient taking differently: Take 50 mg by mouth 2 (two) times daily. 02/23/20  Yes [name], [name] C, NP  oxyCODONE (ROXICODONE) 15 MG immediate release tablet Take  1 tablet (15 mg total) by mouth every 8 (eight) hours as needed for pain. 08/16/20  Yes [name], [name] C, NP  polyethylene glycol (MIRALAX / GLYCOLAX) 17 g packet Take 17 g by mouth daily. 08/21/20  Yes Haskel Schroeder, PA-C  potassium chloride (KLOR-CON) 10 MEQ tablet Take 1 tablet (10 mEq total) by mouth daily. 09/04/20  Yes [name], [name] C, NP  SUMAtriptan (IMITREX) 25 MG tablet TAKE ONE TABLET BY MOUTH ONCE FOR HEADACHE. MAY REPEAT IN 2 HOURS IF HEADACHE PERSISTS OR RECURS. Patient taking differently: Take 25 mg by mouth See admin instructions. Take 1 tablet (25mg ) once, May repeat in 2 hours if headache persists or recurs. 08/16/20  Yes [name], [name] C, NP  vitamin B-12 (CYANOCOBALAMIN) 500 MCG tablet Take 1 tablet (500 mcg total) by mouth daily. 09/26/19  Yes 09/28/19, MD  citalopram (CELEXA) 40 MG tablet Take 0.5 tablets (20 mg total) by mouth daily. Patient not taking: Reported on 09/06/2020 08/16/20   [name], 08/18/20, NP    Allergies    Patient has no known allergies.  Review of Systems   Review of Systems  Constitutional: Positive for fatigue. Negative for chills and fever.  HENT: Negative for ear pain and sore throat.   Eyes: Negative for pain and visual disturbance.  Respiratory: Negative for cough and shortness of breath.   Cardiovascular: Negative for chest pain and palpitations.  Gastrointestinal: Negative for abdominal pain and vomiting.  Genitourinary: Negative for dysuria and hematuria.   Musculoskeletal: Negative for arthralgias and back pain.  Skin: Negative for color change and rash.  Neurological: Negative for seizures and syncope.  All other systems reviewed and are negative.   Physical Exam Updated Vital Signs BP 126/77   Pulse 80   Temp 97.9 F (36.6 C) (Oral)   Resp 19   SpO2 98%   Physical Exam  ED Results / Procedures / Treatments   Labs (all labs ordered are listed, but only abnormal results are displayed) Labs Reviewed  CBC WITH DIFFERENTIAL/PLATELET - Abnormal; Notable for the following components:      Result Value   Hemoglobin 11.8 (*)    Platelets 456 (*)    All other components within normal limits  COMPREHENSIVE METABOLIC PANEL - Abnormal; Notable for the following components:   Potassium 2.7 (*)    Chloride 95 (*)    Glucose, Bld 101 (*)    All other components within normal limits  URINALYSIS, ROUTINE W REFLEX MICROSCOPIC    EKG None  Radiology DG Elbow Complete Left  Result Date: 09/06/2020 CLINICAL DATA:  11/06/2020 at home.  Left elbow pain. EXAM: LEFT ELBOW - COMPLETE 3+ VIEW COMPARISON:  None. FINDINGS: The joint spaces are maintained. Minimal degenerative changes. No acute fracture or osteochondral abnormality. No joint effusion. IMPRESSION: Minimal degenerative changes but no acute bony findings. Electronically Signed   By: Larey Seat M.D.   On: 09/06/2020 12:07   CT Head Wo Contrast  Result Date: 09/06/2020 CLINICAL DATA:  Fall, hit head EXAM: CT HEAD WITHOUT CONTRAST TECHNIQUE: Contiguous axial images were obtained from the base of the skull through the vertex without intravenous contrast. COMPARISON:  08/21/2020 FINDINGS: Brain: There is no acute intracranial hemorrhage, mass effect, or edema. Gray-white differentiation is preserved. There is no extra-axial fluid collection. Ventricles and sulci are within normal limits in size and configuration. Vascular: There is atherosclerotic calcification at the skull base. Skull:  Calvarium is unremarkable. Sinuses/Orbits: No acute finding. Other: Expanded, empty sella.  IMPRESSION: No evidence of acute intracranial injury. Electronically Signed   By: Guadlupe Spanish M.D.   On: 09/06/2020 12:33   CT Cervical Spine Wo Contrast  Result Date: 09/06/2020 CLINICAL DATA:  Fall with neck trauma EXAM: CT CERVICAL SPINE WITHOUT CONTRAST TECHNIQUE: Multidetector CT imaging of the cervical spine was performed without intravenous contrast. Multiplanar CT image reconstructions were also generated. COMPARISON:  08/21/2020 FINDINGS: Alignment: Normal Skull base and vertebrae: Normal. No regional fracture or focal lesion. Soft tissues and spinal canal: Negative Disc levels: No significant disc space pathology. No disc herniation. No significant osteophyte formation. No bony stenosis of the canal. There is facet osteoarthritis on the left at C4-5, C5-6 and C6-7, with mild osteophytic encroachment upon the foramen on the left at C5-6. Upper chest: Negative Other: None IMPRESSION: No traumatic finding. Facet osteoarthritis on the left at C4-5, C5-6 and C6-7 with mild osteophytic encroachment upon the foramen on the left at C5-6. Electronically Signed   By: Paulina Fusi M.D.   On: 09/06/2020 17:32   DG Shoulder Left  Result Date: 09/06/2020 CLINICAL DATA:  Larey Seat at home.  Left shoulder pain. EXAM: LEFT SHOULDER - 2+ VIEW COMPARISON:  Radiographs 08/21/2020 FINDINGS: Stable mild to moderate AC joint degenerative changes and minimal glenohumeral joint degenerative changes. No acute fracture or dislocation is identified. The left lung is grossly clear. There are healing left eighth and ninth posterior rib fractures noted. IMPRESSION: Degenerative changes but no acute bony findings involving the shoulder. Healing left eighth and ninth posterior rib fractures. Electronically Signed   By: Rudie Meyer M.D.   On: 09/06/2020 12:07    Procedures Procedures   Medications Ordered in ED Medications  potassium  chloride (KLOR-CON) packet 40 mEq (40 mEq Oral Given 09/06/20 1513)  acetaminophen (TYLENOL) tablet 1,000 mg (1,000 mg Oral Given 09/06/20 1941)  amitriptyline (ELAVIL) tablet 150 mg (has no administration in time range)  aspirin EC tablet 81 mg (has no administration in time range)  SUMAtriptan (IMITREX) tablet 25 mg (has no administration in time range)  potassium chloride (KLOR-CON) CR tablet 10 mEq (has no administration in time range)  oxyCODONE (Oxy IR/ROXICODONE) immediate release tablet 15 mg (has no administration in time range)  metoprolol tartrate (LOPRESSOR) tablet 50 mg (has no administration in time range)  furosemide (LASIX) tablet 40 mg (has no administration in time range)  citalopram (CELEXA) tablet 20 mg (has no administration in time range)  ondansetron (ZOFRAN) tablet 4 mg (has no administration in time range)  acetaminophen (TYLENOL) tablet 1,000 mg (1,000 mg Oral Given 09/06/20 1146)    ED Course  I have reviewed the triage vital signs and the nursing notes.  Pertinent labs & imaging results that were available during my care of the patient were reviewed by me and considered in my medical decision making (see chart for details).    MDM Rules/Calculators/A&P                         70 year old lady presents to ER with concern for frequent falls, generalized weakness.  Initial evaluation was performed by Dr. Myrtis Ser.  I assumed care to follow-up on results of CT C-spine and PT eval and case management/social work recommendations.  Her trauma work-up today was negative.  Physical therapy recommended SNF.  Social work believes patient will qualify, potentially placement tomorrow.  Will board patient in ER while awaiting placement.  Placed home meds.      Final Clinical Impression(s) /  ED Diagnoses Final diagnoses:  Fall, initial encounter  Generalized weakness    Rx / DC Orders ED Discharge Orders    None       Milagros Loll, MD 09/06/20 2140

## 2020-09-06 NOTE — ED Notes (Signed)
Purewick placed on patient. Pt is aware that urine specimen has been ordered.

## 2020-09-06 NOTE — TOC Progression Note (Signed)
Transition of Care [facility]) - Progression Note    Patient Details  Name: Melinda [name] MRN: [id number] Date of Birth: [birth year]  Transition of Care [facility]) CM/SW Contact  Lorri Frederick, LCSW Phone Number: 09/06/2020, 5:38 PM  Clinical Narrative:   FL2 completed and pt referral sent out on hub.      Barriers to Discharge: Continued Medical Work up,SNF Pending bed offer  Expected Discharge Plan and Services                                                 Social Determinants of Health (SDOH) Interventions    Readmission Risk Interventions No flowsheet data found.

## 2020-09-06 NOTE — TOC Initial Note (Signed)
Transition of Care [facility]) - Initial/Assessment Note    Patient Details  Name: Melinda [name] MRN: [id number] Date of Birth: [birth year]  Transition of Care [facility]) CM/SW Contact:    Elliot Cousin, RN Phone Number: [phone] 09/06/2020, 1:14 PM  Clinical Narrative:                 TOC CM spoke to pt at bedside. States she has completed [facility] and they state they cannot assist her any longer in the home with HHPT. States she benefited from SNF rehab in the past and would like SNF rehab. She has RW and wheelchair at home. Pt had recent falls due to weakness. Ed provider updated. Waiting PT evaluation. Gave permission to fax referral for SNF rehab and create FL2. Pt lives at home alone but her son and daughter are her emergency contact. Discussed with pt Long Term Care at ALF. States she wants to try rehab to stay independent at home.    Barriers to Discharge: Continued Medical Work up,SNF Pending bed offer   Patient Goals and CMS Choice Patient states their goals for this hospitalization and ongoing recovery are:: she wants to get stronger before going home CMS Medicare.gov Compare Post Acute Care list provided to:: Patient Choice offered to / list presented to : Patient  Expected Discharge Plan and Services  Prior Living Arrangements/Services  Activities of Daily Living  Permission Sought/Granted  Emotional Assessment  Admission diagnosis:  fall Patient Active Problem List   Diagnosis Date Noted  . Chronic back pain   . Closed compression fracture of first lumbar vertebra (HCC) 11/02/2019  . Anxiety and depression 11/02/2019  . Hypokalemia 11/02/2019  . Dry eyes, bilateral 10/05/2019  . Mixed hyperlipidemia 10/05/2019  . Lower extremity weakness 09/18/2019  . Migraine headache 09/18/2019  . Acute kidney injury (HCC) 05/10/2019  . Near syncope 05/09/2019  . Fall at home, initial encounter 05/09/2019  . History of 2019 novel coronavirus disease (COVID-19)  05/09/2019  . Anemia of chronic disease 05/09/2019  . Acute on chronic renal insufficiency 05/09/2019  . COVID-19 virus infection 10/12/2018  . Polyneuropathy in diseases classified elsewhere (HCC) 10/04/2018  . Prediabetes 09/12/2017  . High risk medication use 09/12/2017  . Frequent falls 09/12/2017  . Chronic pain syndrome 08/14/2017  . Gastroesophageal reflux disease 08/14/2017  . Cough variant asthma 03/14/2017  . Pulmonary hypertension (HCC) 03/12/2017  . Right-sided chest pain 11/11/2016  . Asthma 11/11/2016  . Atypical chest pain   . Costochondritis   . Chronic congestive heart failure (HCC)   . MDD (major depressive disorder), recurrent episode (HCC) 05/23/2015  . Allergic rhinitis 11/03/2014  . Paresthesia 09/08/2014  . Peripheral neuropathy 07/14/2014  . Morbid obesity due to excess calories (HCC) 07/14/2014  . Pain in limb 07/13/2014  . Abnormality of gait 07/13/2014  . Fibromyalgia 06/13/2014  . Spinal stenosis 01/27/2013  . HTN (hypertension) 11/30/2012   PCP:  Caesar Bookman, NP Pharmacy:   [facility] [phone] [place] Otto, [place] - [address] [place] [address] Phone: [phone] Fax: [phone]     Social Determinants of Health (SDOH) Interventions    Readmission Risk Interventions No flowsheet data found.

## 2020-09-06 NOTE — ED Provider Notes (Signed)
[facility]-EMERGENCY DEPT Provider Note   CSN: [id number] Arrival date & time: 09/06/20  1028     History Chief Complaint  Patient presents with  . Fall    Aleeha Boline is a 70 y.o. female.   Fall This is a new problem. The current episode started 12 to 24 hours ago. The problem occurs daily. The problem has not changed since onset.Associated symptoms include headaches. Pertinent negatives include no chest pain and no shortness of breath. Associated symptoms comments: Shoulder pain and head pain . Nothing aggravates the symptoms. Nothing relieves the symptoms. She has tried nothing for the symptoms. The treatment provided no relief.       Past Medical History:  Diagnosis Date  . Acute kidney injury (HCC)   . Allergy   . Anxiety   . Arthritis    "99% of my body" (11/11/2016)  . Asthma   . Bursitis of both hips   . Cataract   . Cervical scoliosis   . Chronic back pain    "all over my back" (11/11/2016)  . Depressive disorder, not elsewhere classified   . Enthesopathy of hip region   . Environmental allergies    "I take Claritin qd; 365 days/year" (11/11/2016)  . Essential hypertension, benign   . Fibromyalgia   . Frequent falls   . GERD (gastroesophageal reflux disease)   . Headache    "at least 1/wk; may last for 2 days or so" (11/11/2016)  . History of blood transfusion 1985   w/hysterectomy  . Lumbar stenosis   . Migraine    "a few/month" (11/11/2016)  . Muscle weakness (generalized)   . Myalgia and myositis, unspecified   . Osteoporosis   . Other abnormal blood chemistry   . Other malaise and fatigue   . Raynaud's syndrome   . Sciatic nerve pain, left   . Sciatica   . Unspecified vitamin D deficiency     Patient Active Problem List   Diagnosis Date Noted  . Chronic back pain   . Closed compression fracture of first lumbar vertebra (HCC) 11/02/2019  . Anxiety and depression 11/02/2019  . Hypokalemia 11/02/2019  . Dry eyes,  bilateral 10/05/2019  . Mixed hyperlipidemia 10/05/2019  . Lower extremity weakness 09/18/2019  . Migraine headache 09/18/2019  . Acute kidney injury (HCC) 05/10/2019  . Near syncope 05/09/2019  . Fall at home, initial encounter 05/09/2019  . History of 2019 novel coronavirus disease (COVID-19) 05/09/2019  . Anemia of chronic disease 05/09/2019  . Acute on chronic renal insufficiency 05/09/2019  . COVID-19 virus infection 10/12/2018  . Polyneuropathy in diseases classified elsewhere (HCC) 10/04/2018  . Prediabetes 09/12/2017  . High risk medication use 09/12/2017  . Frequent falls 09/12/2017  . Chronic pain syndrome 08/14/2017  . Gastroesophageal reflux disease 08/14/2017  . Cough variant asthma 03/14/2017  . Pulmonary hypertension (HCC) 03/12/2017  . Right-sided chest pain 11/11/2016  . Asthma 11/11/2016  . Atypical chest pain   . Costochondritis   . Chronic congestive heart failure (HCC)   . MDD (major depressive disorder), recurrent episode (HCC) 05/23/2015  . Allergic rhinitis 11/03/2014  . Paresthesia 09/08/2014  . Peripheral neuropathy 07/14/2014  . Morbid obesity due to excess calories (HCC) 07/14/2014  . Pain in limb 07/13/2014  . Abnormality of gait 07/13/2014  . Fibromyalgia 06/13/2014  . Spinal stenosis 01/27/2013  . HTN (hypertension) 11/30/2012    Past Surgical History:  Procedure Laterality Date  . ABDOMINAL HYSTERECTOMY  1985  . CESAREAN SECTION  1976; 1979  . IR KYPHO LUMBAR INC FX REDUCE BONE BX UNI/BIL CANNULATION INC/IMAGING  11/04/2019  . SHOULDER OPEN ROTATOR CUFF REPAIR Left      OB History   No obstetric history on file.     Family History  Problem Relation Age of Onset  . Dementia Mother   . Heart disease Mother   . Hypertension Mother   . Diabetes Mother   . Diabetes Sister   . Cancer Sister        lung  . Cancer Other        Nepher (Mothers side)  . Diabetes Maternal Grandmother   . Arthritis Maternal Grandmother   . Diabetes  Cousin        Mother's side  . Arthritis Sister   . Arthritis Cousin        Mother's side   . Colon cancer Neg Hx   . Esophageal cancer Neg Hx   . Liver cancer Neg Hx   . Pancreatic cancer Neg Hx   . Rectal cancer Neg Hx   . Stomach cancer Neg Hx     Social History   Tobacco Use  . Smoking status: Never Smoker  . Smokeless tobacco: Never Used  Vaping Use  . Vaping Use: Never used  Substance Use Topics  . Alcohol use: No    Alcohol/week: 0.0 standard drinks  . Drug use: No    Home Medications Prior to Admission medications   Medication Sig Start Date End Date Taking? Authorizing Provider  acetaminophen (TYLENOL) 500 MG tablet Take 1,000 mg by mouth every evening.    Yes [provider]  amitriptyline (ELAVIL) 150 MG tablet Take 1 tablet (150 mg total) by mouth at bedtime. 08/16/20  Yes [name], [name] C, NP  ascorbic acid (VITAMIN C) 500 MG tablet Take 1 tablet (500 mg total) by mouth 2 (two) times daily. 09/26/19  Yes Margit Hanks, MD  aspirin EC 81 MG tablet Take 81 mg by mouth in the morning and at bedtime.   Yes [provider]  carboxymethylcellulose (REFRESH PLUS) 0.5 % SOLN Place 2 drops into both eyes 2 (two) times daily as needed (dry eyes).    Yes [provider]  citalopram (CELEXA) 20 MG tablet TAKE 1 TABLET BY MOUTH EVERY DAY Patient taking differently: Take 20 mg by mouth daily. 06/28/20  Yes [name], [name] C, NP  [employer] D3 50 MCG (2000 UT) CAPS TAKE 1 CAPSULE BY MOUTH EVERY DAY Patient taking differently: Take 2,000 Units by mouth daily. 11/08/19  Yes [name], [name] C, NP  fluticasone (FLONASE) 50 MCG/ACT nasal spray Place 2 sprays into both nostrils daily as needed for allergies or rhinitis. 02/23/20  Yes [name], [name] C, NP  furosemide (LASIX) 40 MG tablet Take 1 tablet (40 mg total) by mouth daily. 04/03/20  Yes [name], [name] C, NP  lidocaine (LIDODERM) 5 % Place 1 patch onto the skin daily. Remove & Discard patch within 12 hours or as  directed by MD 08/21/20  Yes Haskel Schroeder, PA-C  loratadine (CLARITIN) 10 MG tablet TAKE 1 TABLET BY MOUTH EVERY DAY Patient taking differently: Take 10 mg by mouth daily. 04/09/20  Yes [name], [name] C, NP  metoprolol tartrate (LOPRESSOR) 50 MG tablet TAKE ONE TABLET BY MOUTH TWICE DAILY FOR BLOOD PRESSURE Patient taking differently: Take 50 mg by mouth 2 (two) times daily. 02/23/20  Yes [name], [name] C, NP  oxyCODONE (ROXICODONE) 15 MG immediate release tablet Take 1 tablet (15  mg total) by mouth every 8 (eight) hours as needed for pain. 08/16/20  Yes [name], [name] C, NP  polyethylene glycol (MIRALAX / GLYCOLAX) 17 g packet Take 17 g by mouth daily. 08/21/20  Yes Haskel Schroeder, PA-C  potassium chloride (KLOR-CON) 10 MEQ tablet Take 1 tablet (10 mEq total) by mouth daily. 09/04/20  Yes [name], [name] C, NP  SUMAtriptan (IMITREX) 25 MG tablet TAKE ONE TABLET BY MOUTH ONCE FOR HEADACHE. MAY REPEAT IN 2 HOURS IF HEADACHE PERSISTS OR RECURS. Patient taking differently: Take 25 mg by mouth See admin instructions. Take 1 tablet ( ) once, May repeat in 2 hours if headache persists or recurs. 08/16/20  Yes [name], [name] C, NP  vitamin B-12 (CYANOCOBALAMIN) 500 MCG tablet Take 1 tablet (500 mcg total) by mouth daily. 09/26/19  Yes Margit Hanks, MD  citalopram (CELEXA) 40 MG tablet Take 0.5 tablets (20 mg total) by mouth daily. Patient not taking: Reported on 09/06/2020 08/16/20   [name], [name], NP    Allergies    Patient has no known allergies.  Review of Systems   Review of Systems  Constitutional: Negative for chills and fever.  HENT: Negative for congestion and rhinorrhea.   Respiratory: Negative for cough and shortness of breath.   Cardiovascular: Negative for chest pain and palpitations.  Gastrointestinal: Negative for diarrhea, nausea and vomiting.  Genitourinary: Negative for difficulty urinating and dysuria.  Musculoskeletal: Positive for arthralgias. Negative for back  pain.  Skin: Negative for rash and wound.  Neurological: Positive for headaches. Negative for light-headedness.    Physical Exam Updated Vital Signs BP (!) 95/56   Pulse 74   Temp 97.9 F (36.6 C) (Oral)   Resp 18   SpO2 99%   Physical Exam Vitals and nursing note reviewed. Exam conducted with a chaperone present.  Constitutional:      General: She is not in acute distress.    Appearance: Normal appearance.  HENT:     Head: Normocephalic and atraumatic.     Nose: No rhinorrhea.  Eyes:     General:        Right eye: No discharge.        Left eye: No discharge.     Conjunctiva/sclera: Conjunctivae normal.  Cardiovascular:     Rate and Rhythm: Normal rate and regular rhythm.  Pulmonary:     Effort: Pulmonary effort is normal. No respiratory distress.     Breath sounds: No stridor.  Abdominal:     General: Abdomen is flat. There is no distension.     Palpations: Abdomen is soft.     Tenderness: There is no abdominal tenderness.  Musculoskeletal:        General: Tenderness present. No signs of injury.     Comments: Left shoulderTenderness about the or and left elbow normal range of motion, neurovascular intact no open wounds no signs of deformity or trauma.  Skin:    General: Skin is warm and dry.  Neurological:     General: No focal deficit present.     Mental Status: She is alert. Mental status is at baseline.     Cranial Nerves: No cranial nerve deficit.     Sensory: No sensory deficit.     Motor: No weakness.     Coordination: Coordination normal.  Psychiatric:        Mood and Affect: Mood normal.        Behavior: Behavior normal.     ED Results / Procedures / Treatments  Labs (all labs ordered are listed, but only abnormal results are displayed) Labs Reviewed  CBC WITH DIFFERENTIAL/PLATELET - Abnormal; Notable for the following components:      Result Value   Hemoglobin 11.8 (*)    Platelets 456 (*)    All other components within normal limits   COMPREHENSIVE METABOLIC PANEL - Abnormal; Notable for the following components:   Potassium 2.7 (*)    Chloride 95 (*)    Glucose, Bld 101 (*)    All other components within normal limits  URINALYSIS, ROUTINE W REFLEX MICROSCOPIC    EKG None  Radiology DG Elbow Complete Left  Result Date: 09/06/2020 CLINICAL DATA:  Larey Seat at home.  Left elbow pain. EXAM: LEFT ELBOW - COMPLETE 3+ VIEW COMPARISON:  None. FINDINGS: The joint spaces are maintained. Minimal degenerative changes. No acute fracture or osteochondral abnormality. No joint effusion. IMPRESSION: Minimal degenerative changes but no acute bony findings. Electronically Signed   By: Rudie Meyer M.D.   On: 09/06/2020 12:07   CT Head Wo Contrast  Result Date: 09/06/2020 CLINICAL DATA:  Fall, hit head EXAM: CT HEAD WITHOUT CONTRAST TECHNIQUE: Contiguous axial images were obtained from the base of the skull through the vertex without intravenous contrast. COMPARISON:  08/21/2020 FINDINGS: Brain: There is no acute intracranial hemorrhage, mass effect, or edema. Gray-white differentiation is preserved. There is no extra-axial fluid collection. Ventricles and sulci are within normal limits in size and configuration. Vascular: There is atherosclerotic calcification at the skull base. Skull: Calvarium is unremarkable. Sinuses/Orbits: No acute finding. Other: Expanded, empty sella. IMPRESSION: No evidence of acute intracranial injury. Electronically Signed   By: Guadlupe Spanish M.D.   On: 09/06/2020 12:33   CT Cervical Spine Wo Contrast  Result Date: 09/06/2020 CLINICAL DATA:  Fall with neck trauma EXAM: CT CERVICAL SPINE WITHOUT CONTRAST TECHNIQUE: Multidetector CT imaging of the cervical spine was performed without intravenous contrast. Multiplanar CT image reconstructions were also generated. COMPARISON:  08/21/2020 FINDINGS: Alignment: Normal Skull base and vertebrae: Normal. No regional fracture or focal lesion. Soft tissues and spinal canal:  Negative Disc levels: No significant disc space pathology. No disc herniation. No significant osteophyte formation. No bony stenosis of the canal. There is facet osteoarthritis on the left at C4-5, C5-6 and C6-7, with mild osteophytic encroachment upon the foramen on the left at C5-6. Upper chest: Negative Other: None IMPRESSION: No traumatic finding. Facet osteoarthritis on the left at C4-5, C5-6 and C6-7 with mild osteophytic encroachment upon the foramen on the left at C5-6. Electronically Signed   By: Paulina Fusi M.D.   On: 09/06/2020 17:32   DG Shoulder Left  Result Date: 09/06/2020 CLINICAL DATA:  Larey Seat at home.  Left shoulder pain. EXAM: LEFT SHOULDER - 2+ VIEW COMPARISON:  Radiographs 08/21/2020 FINDINGS: Stable mild to moderate AC joint degenerative changes and minimal glenohumeral joint degenerative changes. No acute fracture or dislocation is identified. The left lung is grossly clear. There are healing left eighth and ninth posterior rib fractures noted. IMPRESSION: Degenerative changes but no acute bony findings involving the shoulder. Healing left eighth and ninth posterior rib fractures. Electronically Signed   By: Rudie Meyer M.D.   On: 09/06/2020 12:07    Procedures Procedures   Medications Ordered in ED Medications  potassium chloride (KLOR-CON) packet 40 mEq (40 mEq Oral Given 09/06/20 1513)  acetaminophen (TYLENOL) tablet 1,000 mg (1,000 mg Oral Given 09/06/20 1941)  amitriptyline (ELAVIL) tablet 150 mg (150 mg Oral Given 09/06/20 2149)  aspirin EC  tablet 81 mg (has no administration in time range)  SUMAtriptan (IMITREX) tablet 25 mg (has no administration in time range)  potassium chloride (KLOR-CON) CR tablet 10 mEq (has no administration in time range)  oxyCODONE (Oxy IR/ROXICODONE) immediate release tablet 15 mg (has no administration in time range)  metoprolol tartrate (LOPRESSOR) tablet 50 mg (50 mg Oral Given 09/06/20 2150)  furosemide (LASIX) tablet 40 mg (has no  administration in time range)  citalopram (CELEXA) tablet 20 mg (has no administration in time range)  ondansetron (ZOFRAN) tablet 4 mg (has no administration in time range)  acetaminophen (TYLENOL) tablet 1,000 mg (1,000 mg Oral Given 09/06/20 1146)  sodium chloride 0.9 % bolus 1,000 mL (1,000 mLs Intravenous New Bag/Given 09/07/20 0457)    ED Course  I have reviewed the triage vital signs and the nursing notes.  Pertinent labs & imaging results that were available during my care of the patient were reviewed by me and considered in my medical decision making (see chart for details).    MDM Rules/Calculators/A&P                          Multiple falls.  Has sought outpatient help for assistance at home but no one has helped her.  Has been feeling fatigued with poor appetite.  Will get basic labs we will get screening imaging.  Low likelihood of significant injury.  Will consult social work.  Patient's vital signs are stable there is no infectious signs or symptoms.  Review of the chart shows she chronically has lower blood pressures.  She is mentating at her baseline.  Laboratory studies did show hypokalemia.  This is something the patient has suffered from in the past.  Oral supplementation was given.  Imaging reviewed by radiology myself shows no new acute fractures or intracranial abnormality.  Patient is needed skilled nursing rehabilitation in the past.  She feels she is dealing with the same now.  Consulted her case management team and they agree and social work and physical therapy will work to come up with a solution placed as patient is safe living plan.  Patient agrees.  Of note the patient started to complain of neck pain.  No significant midline tenderness no difficulty with range of motion.  We will get a CT scan just to evaluate for complication.  Pt care was handed off to on coming provider at 1500.  Complete history and physical and current plan have been communicated.  Please  refer to their note for the remainder of ED care and ultimate disposition.  Pt seen in conjunction with Dr. Stevie Kern   Final Clinical Impression(s) / ED Diagnoses Final diagnoses:  Fall, initial encounter  Generalized weakness    Rx / DC Orders ED Discharge Orders    None       Sabino Donovan, MD 09/07/20 [id number]

## 2020-09-06 NOTE — Evaluation (Signed)
Physical Therapy Evaluation Patient Details Name: Melinda [name] MRN: [id number] DOB: October 16, 1950 Today's Date: 09/06/2020   History of Present Illness  70 yo female with H/O HTN, CKd, Anxiety/depression brought to ED 09/06/20 after fall in her apt. patient has had multiple falls. negative for fractures. Noted low potassium.  Clinical Impression  The patient reports  Falls when ambulating. Reports HHPT followed her for ` 1.5 months and recently signed off. Patient has been using a WC in her apartment.  patient is noted with intermittent, uncontrolled jerking of arms, legss and trunk. Patient performed a partial stand with mod assist, noted knes subject to buckle so did not pursue.. patient resides alone.  Pt admitted with above diagnosis.  Pt currently with functional limitations due to the deficits listed below (see PT Problem List). Pt will benefit from skilled PT to increase their independence and safety with mobility to allow discharge to the venue listed below.       Follow Up Recommendations SNF    Equipment Recommendations  None recommended by PT    Recommendations for Other Services       Precautions / Restrictions Precautions Precautions: Fall      Mobility  Bed Mobility Overal bed mobility: Needs Assistance Bed Mobility: Supine to Sit;Sit to Supine     Supine to sit: Min guard Sit to supine: Mod assist   General bed mobility comments: extra time,  difficulty lifting legs onto stretcher from sittig    Transfers Overall transfer level: Needs assistance Equipment used: Rolling walker (2 wheeled) Transfers: Sit to/from Stand Sit to Stand: Max assist         General transfer comment: partial stand, legs jerking, potential to buckle so did not attempt further  Ambulation/Gait                Stairs            Wheelchair Mobility    Modified Rankin (Stroke Patients Only)       Balance Overall balance assessment: Needs  assistance Sitting-balance support: Bilateral upper extremity supported;Feet supported Sitting balance-Leahy Scale: Fair     Standing balance support: Bilateral upper extremity supported;During functional activity Standing balance-Leahy Scale: Zero                               Pertinent Vitals/Pain Pain Assessment: Faces Faces Pain Scale: Hurts even more Pain Location: all over Pain Descriptors / Indicators: Aching;Discomfort Pain Intervention(s): Monitored during session;Limited activity within patient's tolerance    Home Living Family/patient expects to be discharged to:: Private residence Living Arrangements: Alone Available Help at Discharge: Available PRN/intermittently Type of Home: Apartment Home Access: Elevator     Home Layout: One level Home Equipment: Environmental consultant - 4 wheels;Bedside commode;Wheelchair - manual      Prior Function           Comments: Uses rollator for short distances. Reports multiple falls. has been using WC in apt.  HHPT just stopped due to " Not making progress" per patient.     Hand Dominance        Extremity/Trunk Assessment   Upper Extremity Assessment Upper Extremity Assessment: Generalized weakness (inntermittent jerking when moving and at rest.)    Lower Extremity Assessment Lower Extremity Assessment: Generalized weakness (noted jerking atr rest and when moving, attempted to stand.)       Communication   Communication: No difficulties  Cognition Arousal/Alertness: Awake/alert Behavior During Therapy: [facility] for tasks  assessed/performed;Anxious Overall Cognitive Status: Within Functional Limits for tasks assessed                                        General Comments      Exercises     Assessment/Plan    PT Assessment Patient needs continued PT services  PT Problem List Pain;Decreased strength;Decreased mobility;Decreased safety awareness;Decreased coordination;Decreased knowledge of  precautions;Decreased activity tolerance;Decreased balance       PT Treatment Interventions DME instruction;Therapeutic activities;Gait training;Therapeutic exercise;Patient/family education;Functional mobility training;Wheelchair mobility training    PT Goals (Current goals can be found in the Care Plan section)  Acute Rehab PT Goals Patient Stated Goal: I want to be able to walk PT Goal Formulation: With patient Time For Goal Achievement: 09/20/20 Potential to Achieve Goals: Fair    Frequency Min 2X/week   Barriers to discharge        Co-evaluation               AM-PAC PT "6 Clicks" Mobility  Outcome Measure Help needed turning from your back to your side while in a flat bed without using bedrails?: A Little Help needed moving from lying on your back to sitting on the side of a flat bed without using bedrails?: A Lot Help needed moving to and from a bed to a chair (including a wheelchair)?: Total Help needed standing up from a chair using your arms (e.g., wheelchair or bedside chair)?: Total Help needed to walk in hospital room?: Total Help needed climbing 3-5 steps with a railing? : Total 6 Click Score: 9    End of Session Equipment Utilized During Treatment: Gait belt Activity Tolerance: Patient limited by fatigue Patient left: in bed;with call bell/phone within reach Nurse Communication: Mobility status PT Visit Diagnosis: Unsteadiness on feet (R26.81);Muscle weakness (generalized) (M62.81);Pain;Repeated falls (R29.6) Pain - part of body: Knee    Time: 1535-1605 PT Time Calculation (min) (ACUTE ONLY): 30 min   Charges:   PT Evaluation $PT Eval Low Complexity: 1 Low PT Treatments $Therapeutic Activity: 8-22 mins       Rada Hay 09/06/2020, 4:15 PM  Blanchard Kelch PT Acute Rehabilitation Services Pager [phone] Office [phone]

## 2020-09-06 NOTE — ED Notes (Signed)
Pt removed Purewick, is still aware of order for urine.

## 2020-09-06 NOTE — NC FL2 (Signed)
[place] MEDICAID FL2 LEVEL OF CARE SCREENING TOOL     IDENTIFICATION  Patient Name: Melinda [name] Birthdate: 03-23-1951 Sex: female Admission Date (Current Location): 09/06/2020  [facility] and [place] Number:  Producer, television/film/video and Address:  [facility],  [address] [place], [place] [address]      Provider Number: [id number]  Attending Physician Name and Address:  Milagros Loll, MD  Relative Name and Phone Number:  Malena Edman   [phone]    Current Level of Care: Hospital Recommended Level of Care: Skilled Nursing Facility Prior Approval Number:    Date Approved/Denied:   PASRR Number: [id number]  Discharge Plan: SNF    Current Diagnoses: Patient Active Problem List   Diagnosis Date Noted  . Chronic back pain   . Closed compression fracture of first lumbar vertebra (HCC) 11/02/2019  . Anxiety and depression 11/02/2019  . Hypokalemia 11/02/2019  . Dry eyes, bilateral 10/05/2019  . Mixed hyperlipidemia 10/05/2019  . Lower extremity weakness 09/18/2019  . Migraine headache 09/18/2019  . Acute kidney injury (HCC) 05/10/2019  . Near syncope 05/09/2019  . Fall at home, initial encounter 05/09/2019  . History of 2019 novel coronavirus disease (COVID-19) 05/09/2019  . Anemia of chronic disease 05/09/2019  . Acute on chronic renal insufficiency 05/09/2019  . COVID-19 virus infection 10/12/2018  . Polyneuropathy in diseases classified elsewhere (HCC) 10/04/2018  . Prediabetes 09/12/2017  . High risk medication use 09/12/2017  . Frequent falls 09/12/2017  . Chronic pain syndrome 08/14/2017  . Gastroesophageal reflux disease 08/14/2017  . Cough variant asthma 03/14/2017  . Pulmonary hypertension (HCC) 03/12/2017  . Right-sided chest pain 11/11/2016  . Asthma 11/11/2016  . Atypical chest pain   . Costochondritis   . Chronic congestive heart failure (HCC)   . MDD (major depressive disorder), recurrent episode (HCC) 05/23/2015  . Allergic  rhinitis 11/03/2014  . Paresthesia 09/08/2014  . Peripheral neuropathy 07/14/2014  . Morbid obesity due to excess calories (HCC) 07/14/2014  . Pain in limb 07/13/2014  . Abnormality of gait 07/13/2014  . Fibromyalgia 06/13/2014  . Spinal stenosis 01/27/2013  . HTN (hypertension) 11/30/2012    Orientation RESPIRATION BLADDER Height & Weight     Self,Time,Situation,Place  Normal Continent Weight:   Height:     BEHAVIORAL SYMPTOMS/MOOD NEUROLOGICAL BOWEL NUTRITION STATUS      Continent Diet (see discharge summary)  AMBULATORY STATUS COMMUNICATION OF NEEDS Skin   Total Care Verbally Normal                       Personal Care Assistance Level of Assistance  Bathing,Feeding,Dressing Bathing Assistance: Limited assistance Feeding assistance: Independent Dressing Assistance: Limited assistance     Functional Limitations Info  Sight,Hearing,Speech Sight Info: Adequate Hearing Info: Adequate Speech Info: Adequate    SPECIAL CARE FACTORS FREQUENCY  PT (By licensed PT),OT (By licensed OT)     PT Frequency: 5x week OT Frequency: 5x week            Contractures Contractures Info: Not present    Additional Factors Info  Code Status,Allergies Code Status Info: full Allergies Info: NKA           Current Medications (09/06/2020):  This is the current hospital active medication list Current Facility-Administered Medications  Medication Dose Route Frequency Provider Last Rate Last Admin  . potassium chloride (KLOR-CON) packet 40 mEq  40 mEq Oral Daily Sabino Donovan, MD   40 mEq at 09/06/20 1513   Current  Outpatient Medications  Medication Sig Dispense Refill  . acetaminophen (TYLENOL) 500 MG tablet Take 1,000 mg by mouth every evening.     Marland Kitchen amitriptyline (ELAVIL) 150 MG tablet Take 1 tablet (150 mg total) by mouth at bedtime. 90 tablet 1  . ascorbic acid (VITAMIN C) 500 MG tablet Take 1 tablet (500 mg total) by mouth 2 (two) times daily. 20 tablet   . aspirin EC 81  MG tablet Take 81 mg by mouth in the morning and at bedtime.    . carboxymethylcellulose (REFRESH PLUS) 0.5 % SOLN Place 2 drops into both eyes 2 (two) times daily as needed (dry eyes).     . citalopram (CELEXA) 20 MG tablet TAKE 1 TABLET BY MOUTH EVERY DAY (Patient taking differently: Take 20 mg by mouth daily.) 90 tablet 1  . [employer] D3 50 MCG (2000 UT) CAPS TAKE 1 CAPSULE BY MOUTH EVERY DAY (Patient taking differently: Take 2,000 Units by mouth daily.) 90 capsule 1  . fluticasone (FLONASE) 50 MCG/ACT nasal spray Place 2 sprays into both nostrils daily as needed for allergies or rhinitis. 48 mL 1  . furosemide (LASIX) 40 MG tablet Take 1 tablet (40 mg total) by mouth daily. 90 tablet 1  . lidocaine (LIDODERM) 5 % Place 1 patch onto the skin daily. Remove & Discard patch within 12 hours or as directed by MD 30 patch 0  . loratadine (CLARITIN) 10 MG tablet TAKE 1 TABLET BY MOUTH EVERY DAY (Patient taking differently: Take 10 mg by mouth daily.) 90 tablet 1  . metoprolol tartrate (LOPRESSOR) 50 MG tablet TAKE ONE TABLET BY MOUTH TWICE DAILY FOR BLOOD PRESSURE (Patient taking differently: Take 50 mg by mouth 2 (two) times daily.) 180 tablet 1  . oxyCODONE (ROXICODONE) 15 MG immediate release tablet Take 1 tablet (15 mg total) by mouth every 8 (eight) hours as needed for pain. 90 tablet 0  . polyethylene glycol (MIRALAX / GLYCOLAX) 17 g packet Take 17 g by mouth daily. 14 each 0  . potassium chloride (KLOR-CON) 10 MEQ tablet Take 1 tablet (10 mEq total) by mouth daily. 30 tablet 1  . SUMAtriptan (IMITREX) 25 MG tablet TAKE ONE TABLET BY MOUTH ONCE FOR HEADACHE. MAY REPEAT IN 2 HOURS IF HEADACHE PERSISTS OR RECURS. (Patient taking differently: Take 25 mg by mouth See admin instructions. Take 1 tablet (25mg ) once, May repeat in 2 hours if headache persists or recurs.) 9 tablet 0  . vitamin B-12 (CYANOCOBALAMIN) 500 MCG tablet Take 1 tablet (500 mcg total) by mouth daily. 30 tablet 0  . citalopram (CELEXA) 40 MG  tablet Take 0.5 tablets (20 mg total) by mouth daily. (Patient not taking: Reported on 09/06/2020) 45 tablet 1     Discharge Medications: Please see discharge summary for a list of discharge medications.  Relevant Imaging Results:  Relevant Lab Results:   Additional Information SSN: 11/06/2020  [id number], LCSW

## 2020-09-06 NOTE — ED Notes (Signed)
Tried contacting patient's daughter Melinda [name] at [phone] to obtain her address and confirm that it is ok to have patient transported there. No answer and there is no voicemail box set up, will continue trying to reach her.

## 2020-09-07 LAB — URINALYSIS, ROUTINE W REFLEX MICROSCOPIC
Bilirubin Urine: NEGATIVE
Glucose, UA: NEGATIVE mg/dL
Ketones, ur: NEGATIVE mg/dL
Nitrite: POSITIVE — AB
Protein, ur: NEGATIVE mg/dL
Specific Gravity, Urine: 1.01 (ref 1.005–1.030)
pH: 6 (ref 5.0–8.0)

## 2020-09-07 LAB — RESP PANEL BY RT-PCR (FLU A&B, COVID) ARPGX2
Influenza A by PCR: NEGATIVE
Influenza B by PCR: NEGATIVE
SARS Coronavirus 2 by RT PCR: NEGATIVE

## 2020-09-07 MED ORDER — SODIUM CHLORIDE 0.9 % IV BOLUS
1000.0000 mL | Freq: Once | INTRAVENOUS | Status: AC
  Administered 2020-09-07: 1000 mL via INTRAVENOUS

## 2020-09-07 NOTE — ED Notes (Signed)
Pt has fluid bolus infusing MD aware of BP. Pts BP has been trending low during admission.

## 2020-09-07 NOTE — ED Notes (Signed)
Pt resting in bed at this time. NAD noted. Equal rise and fall of chest noted. Pt updated on plan of care.

## 2020-09-07 NOTE — Progress Notes (Signed)
TOC CM/CSW spoke with pt.  Pt has agreed to go to [facility]. CSW was updated by [employer].  Ever[facility] [phone] has started [place].  CSW will continue to follow for dc needs.  Ricquita Tarpley-Carter, MSW, LCSW-A Pronouns:  She, Her, Hers                  [facility] Transitions of CareClinical Social Worker [email or web address] [phone]

## 2020-09-07 NOTE — ED Notes (Signed)
Patient placed on pure wick °

## 2020-09-07 NOTE — Progress Notes (Signed)
TOC CM/CSW pt was declined by [facility], due to not being fully vaccinated.  [facility] has accepted pt.  Awaiting for an update on auth from facility.  CSW will continue to follow for dc needs.  Ricquita Tarpley-Carter, MSW, LCSW-A Pronouns:  She, Her, Hers                  [facility] Transitions of CareClinical Social Worker [email or web address] [phone]

## 2020-09-08 DIAGNOSIS — R296 Repeated falls: Secondary | ICD-10-CM | POA: Diagnosis not present

## 2020-09-08 DIAGNOSIS — M25512 Pain in left shoulder: Secondary | ICD-10-CM | POA: Diagnosis not present

## 2020-09-08 DIAGNOSIS — R531 Weakness: Secondary | ICD-10-CM | POA: Diagnosis not present

## 2020-09-08 DIAGNOSIS — R519 Headache, unspecified: Secondary | ICD-10-CM | POA: Diagnosis not present

## 2020-09-08 LAB — BASIC METABOLIC PANEL
Anion gap: 8 (ref 5–15)
BUN: 7 mg/dL — ABNORMAL LOW (ref 8–23)
CO2: 28 mmol/L (ref 22–32)
Calcium: 8.5 mg/dL — ABNORMAL LOW (ref 8.9–10.3)
Chloride: 101 mmol/L (ref 98–111)
Creatinine, Ser: 0.86 mg/dL (ref 0.44–1.00)
GFR, Estimated: >60 mL/min (ref >60)
Glucose, Bld: 94 mg/dL (ref 70–99)
Potassium: 3.4 mmol/L — ABNORMAL LOW (ref 3.5–5.1)
Sodium: 137 mmol/L (ref 135–145)

## 2020-09-08 NOTE — ED Notes (Signed)
Patient is resting comfortably. 

## 2020-09-08 NOTE — ED Provider Notes (Addendum)
Emergency Medicine Observation Re-evaluation Note  Melinda [name] is a 70 y.o. female, seen on rounds today.  Pt initially presented to the ED for complaints of Fall Currently, the patient is holding in the ED for skilled nursing facility placement.  Physical Exam  BP 97/70 (BP Location: Right Arm)   Pulse 79   Temp 97.9 F (36.6 C) (Oral)   Resp 16   SpO2 100%  Physical Exam General: Calm comfortable no  Cardiac: Regular rate and rhythm Lungs: Clear to auscultation Psych: Normal mood  ED Course / MDM  EKG:    I have reviewed the labs performed to date as well as medications administered while in observation.  Recent changes in the last 24 hours include labs reviewed during her stay.  Patient's urinalysis does show many bacteria.  She also has positive nitrite and moderate leukocyte esterase.  Patient not having any symptoms.  We will send off urine culture.  Patient did have low potassium on the 10th.  She was given replacement.  We will check her potassium levels today..  Plan  Current plan is for SNF placement.    Linwood Dibbles, MD 09/08/20 0805  Potassium improved to 3.4    Linwood Dibbles, MD 09/08/20 [phone]

## 2020-09-08 NOTE — ED Notes (Signed)
Pt. Received a breakfast tray. Nurse aware.  

## 2020-09-08 NOTE — Progress Notes (Signed)
CSW spoke with [place] at [facility] authorization is still pending at this time. Monica Becton to notify CSW whenever she receives authorization from [facility].  Edwin Dada, MSW, LCSW Transitions of Care  Clinical Social Worker II [phone]

## 2020-09-08 NOTE — ED Notes (Signed)
Pt. Aware of needing a urine culture. Will collect urine culture when pt. Voids. Nurse aware.

## 2020-09-09 ENCOUNTER — Other Ambulatory Visit: Payer: Self-pay

## 2020-09-09 DIAGNOSIS — M25512 Pain in left shoulder: Secondary | ICD-10-CM | POA: Diagnosis not present

## 2020-09-09 DIAGNOSIS — R531 Weakness: Secondary | ICD-10-CM | POA: Diagnosis not present

## 2020-09-09 DIAGNOSIS — R296 Repeated falls: Secondary | ICD-10-CM | POA: Diagnosis not present

## 2020-09-09 DIAGNOSIS — R519 Headache, unspecified: Secondary | ICD-10-CM | POA: Diagnosis not present

## 2020-09-09 LAB — COMPREHENSIVE METABOLIC PANEL
ALT: 10 U/L (ref 0–44)
AST: 18 U/L (ref 15–41)
Albumin: 3.1 g/dL — ABNORMAL LOW (ref 3.5–5.0)
Alkaline Phosphatase: 44 U/L (ref 38–126)
Anion gap: 9 (ref 5–15)
BUN: 9 mg/dL (ref 8–23)
CO2: 26 mmol/L (ref 22–32)
Calcium: 8.8 mg/dL — ABNORMAL LOW (ref 8.9–10.3)
Chloride: 101 mmol/L (ref 98–111)
Creatinine, Ser: 0.93 mg/dL (ref 0.44–1.00)
GFR, Estimated: >60 mL/min (ref >60)
Glucose, Bld: 84 mg/dL (ref 70–99)
Potassium: 4.2 mmol/L (ref 3.5–5.1)
Sodium: 136 mmol/L (ref 135–145)
Total Bilirubin: 0.9 mg/dL (ref 0.3–1.2)
Total Protein: 6 g/dL — ABNORMAL LOW (ref 6.5–8.1)

## 2020-09-09 LAB — CBC WITH DIFFERENTIAL/PLATELET
Abs Immature Granulocytes: 0.05 10*3/uL (ref 0.00–0.07)
Basophils Absolute: 0.1 10*3/uL (ref 0.0–0.1)
Basophils Relative: 1 %
Eosinophils Absolute: 0.1 10*3/uL (ref 0.0–0.5)
Eosinophils Relative: 2 %
HCT: 35.6 % — ABNORMAL LOW (ref 36.0–46.0)
Hemoglobin: 11 g/dL — ABNORMAL LOW (ref 12.0–15.0)
Immature Granulocytes: 1 %
Lymphocytes Relative: 45 %
Lymphs Abs: 2.7 10*3/uL (ref 0.7–4.0)
MCH: 27.6 pg (ref 26.0–34.0)
MCHC: 30.9 g/dL (ref 30.0–36.0)
MCV: 89.4 fL (ref 80.0–100.0)
Monocytes Absolute: 0.8 10*3/uL (ref 0.1–1.0)
Monocytes Relative: 13 %
Neutro Abs: 2.3 10*3/uL (ref 1.7–7.7)
Neutrophils Relative %: 38 %
Platelets: 358 10*3/uL (ref 150–400)
RBC: 3.98 MIL/uL (ref 3.87–5.11)
RDW: 14.2 % (ref 11.5–15.5)
WBC: 6.1 10*3/uL (ref 4.0–10.5)
nRBC: 0 % (ref 0.0–0.2)

## 2020-09-09 LAB — LACTIC ACID, PLASMA: Lactic Acid, Venous: 1.1 mmol/L (ref 0.5–1.9)

## 2020-09-09 MED ORDER — LACTATED RINGERS IV SOLN: INTRAVENOUS | Status: AC

## 2020-09-09 MED ORDER — SODIUM CHLORIDE 0.9 % IV BOLUS
500.0000 mL | Freq: Once | INTRAVENOUS | Status: AC
  Administered 2020-09-09: 500 mL via INTRAVENOUS

## 2020-09-09 NOTE — ED Provider Notes (Signed)
Emergency Medicine Observation Re-evaluation Note  Melinda [name] is a 70 y.o. female, seen on rounds today.  Pt initially presented to the ED for complaints of Fall Currently, the patient is SNF placement.  Physical Exam  BP 131/73   Pulse 70   Temp 98 F (36.7 C)   Resp 17   SpO2 98%  Physical Exam General: Calm cooperative.  Cardiac: Well perfused.  Lungs: Even, unlabored respirations.  Psych: Calm and cooperative.   ED Course / MDM  EKG:    I have reviewed the labs performed to date as well as medications administered while in observation.  Recent changes in the last 24 hours include awaiting insurance authorization.  10:45 AM  Made aware by nursing staff that blood pressure medications were held this morning with some lower blood pressures.  I went to evaluate the patient and she is awake and alert.  She is not having significant complaints at this time.  I performed a manual blood pressure and found her manual blood pressure to be 98/65.  Plan to repeat some blood work here.  She does not appear septic.  She does not appear to be in cardiogenic shock.  Plan for gentle IV fluid bolus, hold metoprolol, reassess.  Abdomen is soft with no significant tenderness.  Lungs are clear.   11:30 AM  On reevaluation patient's blood pressures continued to be in the 90s.  She has minimal symptoms.  Labs show normalization of her potassium.  Normal lactate.  No severe anemia.  She is overall feeling well and is awake and alert. BP cuff not correlating well with pressures inconsistent. Continue to hold BP meds PRN if low pressures continue. No additional intervention at this time. Will stop additional IVF infusion this afternoon.   Plan  Current plan is for SNF placement. Maia Plan, MD 09/09/20 [phone]

## 2020-09-09 NOTE — Progress Notes (Signed)
TOC CM/CSW spoke with Kenisha/[facility] [phone].  Pts Berkley [facility] is still pending.  CSW will continue to follow for dc needs.  Medina Degraffenreid Tarpley-Carter, MSW, LCSW-A Pronouns:  She, Her, Hers                  [facility] Transitions of CareClinical Social Worker [email or web address] [phone]

## 2020-09-09 NOTE — ED Notes (Signed)
Dr. Jacqulyn Bath made aware of patient low BP and c/o dizziness. MD at bedside to assessed the the patient.

## 2020-09-10 ENCOUNTER — Other Ambulatory Visit: Payer: Self-pay | Admitting: *Deleted

## 2020-09-10 ENCOUNTER — Telehealth: Payer: Self-pay | Admitting: *Deleted

## 2020-09-10 DIAGNOSIS — W19XXXA Unspecified fall, initial encounter: Secondary | ICD-10-CM | POA: Diagnosis not present

## 2020-09-10 DIAGNOSIS — M797 Fibromyalgia: Secondary | ICD-10-CM

## 2020-09-10 DIAGNOSIS — G8929 Other chronic pain: Secondary | ICD-10-CM

## 2020-09-10 DIAGNOSIS — R296 Repeated falls: Secondary | ICD-10-CM | POA: Diagnosis not present

## 2020-09-10 DIAGNOSIS — R5381 Other malaise: Secondary | ICD-10-CM | POA: Diagnosis not present

## 2020-09-10 DIAGNOSIS — G894 Chronic pain syndrome: Secondary | ICD-10-CM

## 2020-09-10 DIAGNOSIS — R279 Unspecified lack of coordination: Secondary | ICD-10-CM | POA: Diagnosis not present

## 2020-09-10 DIAGNOSIS — M25512 Pain in left shoulder: Secondary | ICD-10-CM | POA: Diagnosis not present

## 2020-09-10 DIAGNOSIS — M5442 Lumbago with sciatica, left side: Secondary | ICD-10-CM

## 2020-09-10 DIAGNOSIS — Z743 Need for continuous supervision: Secondary | ICD-10-CM | POA: Diagnosis not present

## 2020-09-10 DIAGNOSIS — R531 Weakness: Secondary | ICD-10-CM | POA: Diagnosis not present

## 2020-09-10 DIAGNOSIS — R519 Headache, unspecified: Secondary | ICD-10-CM | POA: Diagnosis not present

## 2020-09-10 MED ORDER — OXYCODONE HCL 15 MG PO TABS: 15.0000 mg | ORAL_TABLET | Freq: Three times a day (TID) | ORAL | 0 refills | Status: DC | PRN

## 2020-09-10 NOTE — Progress Notes (Signed)
TOC CM/CSW spoke with pts son/Derrick Lewis [phone].  Pt nor son/Derrick wants pt to go to a SNF.  CSW will assist pt with transportation home.  Breanna Shorkey Tarpley-Carter, MSW, LCSW-A Pronouns:  She, Her, Hers                  [facility] Transitions of CareClinical Social Worker [email or web address] [phone]

## 2020-09-10 NOTE — TOC Initial Note (Addendum)
Transition of Care [facility]) - Initial/Assessment Note    Patient Details  Name: Melinda [name] MRN: [id number] Date of Birth: 1950/08/30  [facility] CM/SW Contact:    Elliot Cousin, RN Phone Number: [phone] 09/10/2020, 12:58 PM  Clinical Narrative:                  TOC CM spoke to pt/son (on speaker phone) and states she wants to go home now. Son is adamant he does not want her to to go SNF rehab. Explained to pt's son, Acquanetta Belling that pt will need a caregiver in the home to assist with her care. She is unable to stand independently and PT recommendations are for SNF as pt lives alone. He is going to make arrangements to come today to care for her in the home and if necessary take her back with him. Notified [facility] rep, Lawerance Cruel pt is declining SNF at this time. PTAR arranged.  ED provider and RN updated.    Expected Discharge Plan: Home w Home Health Services Barriers to Discharge: No Barriers Identified   Patient Goals and CMS Choice Patient states their goals for this hospitalization and ongoing recovery are:: pt wants to go home. son does not want her to go to SNF rehab CMS Medicare.gov Compare Post Acute Care list provided to:: Patient Choice offered to / list presented to : Patient  Expected Discharge Plan and Services Expected Discharge Plan: Home w Home Health Services In-house Referral: Clinical Social Work Discharge Planning Services: CM Consult Post Acute Care Choice: Home Health Living arrangements for the past 2 months: Apartment                           HH Arranged: PT HH Agency: [facility] Date HH Agency Contacted: 09/10/20 Time HH Agency Contacted: 1257 Representative spoke with at [facility]: [facility]  Prior Living Arrangements/Services Living arrangements for the past 2 months: Apartment Lives with:: Self Patient language and need for interpreter reviewed:: Yes Do you feel safe going back to the place where you  live?: Yes      Need for Family Participation in Patient Care: Yes (Comment) Care giver support system in place?: Yes (comment) Current home services: DME (wheelchair, Rollator, RW, cane) Criminal Activity/Legal Involvement Pertinent to Current Situation/Hospitalization: No - Comment as needed  Activities of Daily Living      Permission Sought/Granted Permission sought to share information with : Case Manager,Family Electrical engineer Permission granted to share information with : Yes, Verbal Permission Granted  Share Information with NAME: Acquanetta Belling  Permission granted to share info w AGENCY: SNF Rehab  Permission granted to share info w Relationship: son  Permission granted to share info w Contact Information: [phone]  Emotional Assessment Appearance:: Appears stated age Attitude/Demeanor/Rapport: Gracious,Engaged Affect (typically observed): Accepting Orientation: : Oriented to Self,Oriented to Place,Oriented to  Time,Oriented to Situation   Psych Involvement: No (comment)  Admission diagnosis:  fall Patient Active Problem List   Diagnosis Date Noted  . Chronic back pain   . Closed compression fracture of first lumbar vertebra (HCC) 11/02/2019  . Anxiety and depression 11/02/2019  . Hypokalemia 11/02/2019  . Dry eyes, bilateral 10/05/2019  . Mixed hyperlipidemia 10/05/2019  . Lower extremity weakness 09/18/2019  . Migraine headache 09/18/2019  . Acute kidney injury (HCC) 05/10/2019  . Near syncope 05/09/2019  . Fall at home, initial encounter 05/09/2019  . History of  2019 novel coronavirus disease (COVID-19) 05/09/2019  . Anemia of chronic disease 05/09/2019  . Acute on chronic renal insufficiency 05/09/2019  . COVID-19 virus infection 10/12/2018  . Polyneuropathy in diseases classified elsewhere (HCC) 10/04/2018  . Prediabetes 09/12/2017  . High risk medication use 09/12/2017  . Frequent falls 09/12/2017  . Chronic pain syndrome  08/14/2017  . Gastroesophageal reflux disease 08/14/2017  . Cough variant asthma 03/14/2017  . Pulmonary hypertension (HCC) 03/12/2017  . Right-sided chest pain 11/11/2016  . Asthma 11/11/2016  . Atypical chest pain   . Costochondritis   . Chronic congestive heart failure (HCC)   . MDD (major depressive disorder), recurrent episode (HCC) 05/23/2015  . Allergic rhinitis 11/03/2014  . Paresthesia 09/08/2014  . Peripheral neuropathy 07/14/2014  . Morbid obesity due to excess calories (HCC) 07/14/2014  . Pain in limb 07/13/2014  . Abnormality of gait 07/13/2014  . Fibromyalgia 06/13/2014  . Spinal stenosis 01/27/2013  . HTN (hypertension) 11/30/2012   PCP:  Caesar Bookman, NP Pharmacy:   [facility] [phone] Ginette Otto, [place] - [address] [place] [address] Phone: [phone] Fax: [phone]     Social Determinants of Health (SDOH) Interventions    Readmission Risk Interventions No flowsheet data found.

## 2020-09-10 NOTE — Discharge Instructions (Addendum)
Follow up with your family md either the end of this week or next week

## 2020-09-10 NOTE — ED Notes (Signed)
Pt given coffee. °

## 2020-09-10 NOTE — ED Notes (Signed)
Pt assisted to recliner and linens changed. Pt has no complaints at this time

## 2020-09-10 NOTE — ED Provider Notes (Signed)
Emergency Medicine Observation Re-evaluation Note  Melinda [name] is a 70 y.o. female, seen on rounds today.  Pt initially presented to the ED for complaints of Fall   Physical Exam  BP 94/67 (BP Location: Left Arm)   Pulse 84   Temp 97.6 F (36.4 C) (Oral)   Resp 17   SpO2 100%  Physical Exam Alert stable  ED Course / MDM  EKG:    I have reviewed the labs performed to date as well as medications administered while in observation.   Plan  Current plan is for nh placement    Bethann Berkshire, MD 09/10/20 [phone]

## 2020-09-10 NOTE — Telephone Encounter (Signed)
Me    09/10/20 2:57 PM Note Patient called and requested refill. Patient is out of ER and requesting refill.  Epic LR: 08/16/2020 Contract needs to be updated and added note to upcoming appointment.  Pended Rx and sent to [facility] for approval.      Rx Refill Request sent to [facility] for approval.

## 2020-09-10 NOTE — Telephone Encounter (Signed)
Patient called and requested refill. Patient is out of ER and requesting refill.  Epic LR: 08/16/2020 Contract needs to be updated and added note to upcoming appointment.  Pended Rx and sent to [facility] for approval.

## 2020-09-10 NOTE — Telephone Encounter (Signed)
Patient called and left message on Clinical Intake Voicemail stating that she needs a refill on her Oxycodone.   Patient is currently in the Hospital. Cannot refill until after discharge.   Tried calling patient and LMOM to return call.

## 2020-09-10 NOTE — ED Notes (Signed)
Pt given products for ADL's. Will assist to ambulate to chair

## 2020-09-10 NOTE — Progress Notes (Signed)
TOC CM/CSW contacted pts son/Derrick Lewis [phone] in regards to pt being transferred to [facility].  Ladene Artist states that he does not want pt to go to a SNF.  Derrick then states he would speak to pt and give CSW a call back.  CSW will continue to follow for dc needs.  Daegan Arizmendi Tarpley-Carter, MSW, LCSW-A Pronouns:  She, Her, Hers                  [facility] Transitions of CareClinical Social Worker [email or web address] [phone]

## 2020-09-10 NOTE — Progress Notes (Signed)
Physical Therapy Treatment Patient Details Name: Melinda [name] MRN: [id number] DOB: 12-05-1950 Today's Date: 09/10/2020    History of Present Illness 70 yo female with H/O HTN, CKd, Anxiety/depression brought to ED 09/06/20 after fall in her apt. patient has had multiple falls. negative for fractures. Noted low potassium.    PT Comments    Worked with patient on standing at R[place], legs noted to buckle, "bobbing", instability and patient sits down quickly with no control of descent. Patient then set up for squat pivot transfer from recliner to chair with arms, noted right leg with decreased control and  foot got too far under her. PT had to stabilize to complete transfer. Return  Transfer to  Recliner was more controlled.  Patient with noted UE dysmetria, appears Right more so than left.  Patient reports that her son is coming from [place] today and will take her back to his home. Patient is to DC to her apt today. . Patient remains at risk for fall due to decreased control of LE's.  Follow Up Recommendations  SNF     Equipment Recommendations  None recommended by PT    Recommendations for Other Services       Precautions / Restrictions Precautions Precautions: Fall    Mobility  Bed Mobility               General bed mobility comments: in recliner    Transfers Overall transfer level: Needs assistance Equipment used: Rolling walker (2 wheeled) Transfers: Sit to/from [employer] Sit to Stand: Mod assist         General transfer comment: cues to push from  recliner. noted  trunk and leg tremors with effort to stand, Knees buckling with decreased control of descent. Stood x 2 at [employer].. set up chair with arms to practice transfers as she does at  home PTA. Patient's right leg ended up  being too far under her, required mod assistnace for safety. Transferred back to recliner with improved  technique  Ambulation/Gait                 Stairs              Wheelchair Mobility    Modified Rankin (Stroke Patients Only)       Balance Overall balance assessment: History of Falls;Needs assistance Sitting-balance support: Feet supported Sitting balance-Leahy Scale: Fair Sitting balance - Comments: in recliner, steady balance sitting.   Standing balance support: Bilateral upper extremity supported;During functional activity Standing balance-Leahy Scale: Zero Standing balance comment: noted trunk sway, knees "bobbing", patient had to sit quickly with decreased control                            Cognition   Behavior During Therapy: [facility] for tasks assessed/performed;Anxious                                          Exercises      General Comments        Pertinent Vitals/Pain Faces Pain Scale: Hurts even more Pain Location: lower abdomen Pain Descriptors / Indicators: Cramping Pain Intervention(s): Monitored during session    Home Living                      Prior Function  PT Goals (current goals can now be found in the care plan section) Progress towards PT goals: Progressing toward goals    Frequency    Min 2X/week      PT Plan Current plan remains appropriate    Co-evaluation              AM-PAC PT "6 Clicks" Mobility   Outcome Measure  Help needed turning from your back to your side while in a flat bed without using bedrails?: A Little Help needed moving from lying on your back to sitting on the side of a flat bed without using bedrails?: A Little Help needed moving to and from a bed to a chair (including a wheelchair)?: Total Help needed standing up from a chair using your arms (e.g., wheelchair or bedside chair)?: Total Help needed to walk in hospital room?: Total Help needed climbing 3-5 steps with a railing? : Total 6 Click Score: 10    End of Session Equipment Utilized During Treatment: Gait belt Activity Tolerance: Patient tolerated  treatment well Patient left: in chair;with call bell/phone within reach Nurse Communication: Mobility status PT Visit Diagnosis: Unsteadiness on feet (R26.81);Muscle weakness (generalized) (M62.81);Pain;Repeated falls (R29.6)     Time: 0160-1093 PT Time Calculation (min) (ACUTE ONLY): 28 min  Charges:  $Therapeutic Activity: 23-37 mins                     Blanchard Kelch PT Acute Rehabilitation Services Pager [phone] Office [phone]    Rada Hay 09/10/2020, 1:10 PM

## 2020-09-13 ENCOUNTER — Other Ambulatory Visit: Payer: Self-pay | Admitting: Family

## 2020-09-13 DIAGNOSIS — G43009 Migraine without aura, not intractable, without status migrainosus: Secondary | ICD-10-CM

## 2020-09-13 DIAGNOSIS — F332 Major depressive disorder, recurrent severe without psychotic features: Secondary | ICD-10-CM

## 2020-09-17 ENCOUNTER — Ambulatory Visit: Payer: Medicare HMO | Admitting: Family

## 2020-09-18 ENCOUNTER — Other Ambulatory Visit: Payer: Self-pay

## 2020-09-18 ENCOUNTER — Emergency Department (HOSPITAL_COMMUNITY)
Admission: EM | Admit: 2020-09-18 | Discharge: 2020-09-19 | Disposition: A | Discharge status: Home / Self Care | Payer: Medicare HMO | Attending: Emergency Medicine | Admitting: Emergency Medicine

## 2020-09-18 ENCOUNTER — Encounter (HOSPITAL_COMMUNITY): Payer: Self-pay

## 2020-09-18 ENCOUNTER — Emergency Department (HOSPITAL_COMMUNITY): Payer: Medicare HMO

## 2020-09-18 DIAGNOSIS — G63 Polyneuropathy in diseases classified elsewhere: Secondary | ICD-10-CM | POA: Diagnosis not present

## 2020-09-18 DIAGNOSIS — W19XXXA Unspecified fall, initial encounter: Secondary | ICD-10-CM | POA: Diagnosis not present

## 2020-09-18 DIAGNOSIS — M5442 Lumbago with sciatica, left side: Secondary | ICD-10-CM | POA: Diagnosis not present

## 2020-09-18 DIAGNOSIS — R531 Weakness: Secondary | ICD-10-CM

## 2020-09-18 DIAGNOSIS — R0781 Pleurodynia: Secondary | ICD-10-CM | POA: Insufficient documentation

## 2020-09-18 DIAGNOSIS — Y92009 Unspecified place in unspecified non-institutional (private) residence as the place of occurrence of the external cause: Secondary | ICD-10-CM | POA: Diagnosis not present

## 2020-09-18 DIAGNOSIS — M25552 Pain in left hip: Secondary | ICD-10-CM | POA: Insufficient documentation

## 2020-09-18 DIAGNOSIS — I509 Heart failure, unspecified: Secondary | ICD-10-CM | POA: Diagnosis not present

## 2020-09-18 DIAGNOSIS — W1839XA Other fall on same level, initial encounter: Secondary | ICD-10-CM | POA: Insufficient documentation

## 2020-09-18 DIAGNOSIS — I110 Hypertensive heart disease with heart failure: Secondary | ICD-10-CM | POA: Insufficient documentation

## 2020-09-18 DIAGNOSIS — E876 Hypokalemia: Secondary | ICD-10-CM

## 2020-09-18 DIAGNOSIS — Z8616 Personal history of COVID-19: Secondary | ICD-10-CM | POA: Diagnosis not present

## 2020-09-18 DIAGNOSIS — Z79899 Other long term (current) drug therapy: Secondary | ICD-10-CM | POA: Insufficient documentation

## 2020-09-18 DIAGNOSIS — J45909 Unspecified asthma, uncomplicated: Secondary | ICD-10-CM | POA: Diagnosis not present

## 2020-09-18 DIAGNOSIS — R627 Adult failure to thrive: Secondary | ICD-10-CM

## 2020-09-18 DIAGNOSIS — S2232XD Fracture of one rib, left side, subsequent encounter for fracture with routine healing: Secondary | ICD-10-CM | POA: Diagnosis not present

## 2020-09-18 DIAGNOSIS — Z7982 Long term (current) use of aspirin: Secondary | ICD-10-CM | POA: Diagnosis not present

## 2020-09-18 DIAGNOSIS — Z043 Encounter for examination and observation following other accident: Secondary | ICD-10-CM | POA: Diagnosis not present

## 2020-09-18 DIAGNOSIS — R29898 Other symptoms and signs involving the musculoskeletal system: Secondary | ICD-10-CM | POA: Diagnosis not present

## 2020-09-18 DIAGNOSIS — I10 Essential (primary) hypertension: Secondary | ICD-10-CM | POA: Diagnosis not present

## 2020-09-18 DIAGNOSIS — R52 Pain, unspecified: Secondary | ICD-10-CM | POA: Diagnosis not present

## 2020-09-18 DIAGNOSIS — R269 Unspecified abnormalities of gait and mobility: Secondary | ICD-10-CM | POA: Diagnosis not present

## 2020-09-18 LAB — CBG MONITORING, ED: Glucose-Capillary: 76 mg/dL (ref 70–99)

## 2020-09-18 MED ORDER — METOPROLOL TARTRATE 25 MG PO TABS
50.0000 mg | ORAL_TABLET | Freq: Two times a day (BID) | ORAL | Status: DC
  Administered 2020-09-19: 50 mg via ORAL
  Filled 2020-09-18: qty 2

## 2020-09-18 MED ORDER — AMITRIPTYLINE HCL 25 MG PO TABS
150.0000 mg | ORAL_TABLET | Freq: Every day | ORAL | Status: DC
  Administered 2020-09-19: 150 mg via ORAL
  Filled 2020-09-18: qty 6

## 2020-09-18 MED ORDER — CITALOPRAM HYDROBROMIDE 10 MG PO TABS
20.0000 mg | ORAL_TABLET | Freq: Every day | ORAL | Status: DC
  Administered 2020-09-19: 20 mg via ORAL
  Filled 2020-09-18: qty 2

## 2020-09-18 MED ORDER — SUMATRIPTAN SUCCINATE 25 MG PO TABS: 25.0000 mg | ORAL_TABLET | ORAL | Status: DC | PRN

## 2020-09-18 MED ORDER — FUROSEMIDE 20 MG PO TABS
40.0000 mg | ORAL_TABLET | Freq: Every day | ORAL | Status: DC
  Administered 2020-09-19: 40 mg via ORAL
  Filled 2020-09-18: qty 2

## 2020-09-18 MED ORDER — CITALOPRAM HYDROBROMIDE 10 MG PO TABS: 20.0000 mg | ORAL_TABLET | Freq: Every day | ORAL | Status: DC

## 2020-09-18 NOTE — ED Provider Notes (Signed)
[facility] EMERGENCY DEPARTMENT Provider Note   CSN: [id number] Arrival date & time: 09/18/20  1359     History Chief Complaint  Patient presents with  . Fall    Melinda [name] is a 69 y.o. female.  70 year old female with prior medical history as detailed below presents for evaluation following reported fall.  Patient was at home.  As she was leaving her bed she fell.  She apparently was holding her walker but the walker did not keep her from falling.  She landed hard onto her left hip and left ribs.  She may have struck her head.  She denies LOC.  She denies other specific complaints such as extremity pain.  Patient with multiple recent falls.  Patient reports that she has been evaluated for possible SNF placement.  She declined same.  She prefers to be in her own home.  The history is provided by the patient and medical records.  Fall This is a recurrent problem. The current episode started 1 to 2 hours ago. The problem occurs every several days. The problem has not changed since onset.Pertinent negatives include no chest pain, no abdominal pain, no headaches and no shortness of breath. Nothing aggravates the symptoms. Nothing relieves the symptoms.       Past Medical History:  Diagnosis Date  . Acute kidney injury (HCC)   . Allergy   . Anxiety   . Arthritis    "99% of my body" (11/11/2016)  . Asthma   . Bursitis of both hips   . Cataract   . Cervical scoliosis   . Chronic back pain    "all over my back" (11/11/2016)  . Depressive disorder, not elsewhere classified   . Enthesopathy of hip region   . Environmental allergies    "I take Claritin qd; 365 days/year" (11/11/2016)  . Essential hypertension, benign   . Fibromyalgia   . Frequent falls   . GERD (gastroesophageal reflux disease)   . Headache    "at least 1/wk; may last for 2 days or so" (11/11/2016)  . History of blood transfusion 1985   w/hysterectomy  . Lumbar stenosis   . Migraine    "a  few/month" (11/11/2016)  . Muscle weakness (generalized)   . Myalgia and myositis, unspecified   . Osteoporosis   . Other abnormal blood chemistry   . Other malaise and fatigue   . Raynaud's syndrome   . Sciatic nerve pain, left   . Sciatica   . Unspecified vitamin D deficiency     Patient Active Problem List   Diagnosis Date Noted  . Chronic back pain   . Closed compression fracture of first lumbar vertebra (HCC) 11/02/2019  . Anxiety and depression 11/02/2019  . Hypokalemia 11/02/2019  . Dry eyes, bilateral 10/05/2019  . Mixed hyperlipidemia 10/05/2019  . Lower extremity weakness 09/18/2019  . Migraine headache 09/18/2019  . Acute kidney injury (HCC) 05/10/2019  . Near syncope 05/09/2019  . Fall at home, initial encounter 05/09/2019  . History of 2019 novel coronavirus disease (COVID-19) 05/09/2019  . Anemia of chronic disease 05/09/2019  . Acute on chronic renal insufficiency 05/09/2019  . COVID-19 virus infection 10/12/2018  . Polyneuropathy in diseases classified elsewhere (HCC) 10/04/2018  . Prediabetes 09/12/2017  . High risk medication use 09/12/2017  . Frequent falls 09/12/2017  . Chronic pain syndrome 08/14/2017  . Gastroesophageal reflux disease 08/14/2017  . Cough variant asthma 03/14/2017  . Pulmonary hypertension (HCC) 03/12/2017  . Right-sided chest pain 11/11/2016  .  Asthma 11/11/2016  . Atypical chest pain   . Costochondritis   . Chronic congestive heart failure (HCC)   . MDD (major depressive disorder), recurrent episode (HCC) 05/23/2015  . Allergic rhinitis 11/03/2014  . Paresthesia 09/08/2014  . Peripheral neuropathy 07/14/2014  . Morbid obesity due to excess calories (HCC) 07/14/2014  . Pain in limb 07/13/2014  . Abnormality of gait 07/13/2014  . Fibromyalgia 06/13/2014  . Spinal stenosis 01/27/2013  . HTN (hypertension) 11/30/2012    Past Surgical History:  Procedure Laterality Date  . ABDOMINAL HYSTERECTOMY  1985  . CESAREAN SECTION   1976; 1979  . IR KYPHO LUMBAR INC FX REDUCE BONE BX UNI/BIL CANNULATION INC/IMAGING  11/04/2019  . SHOULDER OPEN ROTATOR CUFF REPAIR Left      OB History   No obstetric history on file.     Family History  Problem Relation Age of Onset  . Dementia Mother   . Heart disease Mother   . Hypertension Mother   . Diabetes Mother   . Diabetes Sister   . Cancer Sister        lung  . Cancer Other        Nepher (Mothers side)  . Diabetes Maternal Grandmother   . Arthritis Maternal Grandmother   . Diabetes Cousin        Mother's side  . Arthritis Sister   . Arthritis Cousin        Mother's side   . Colon cancer Neg Hx   . Esophageal cancer Neg Hx   . Liver cancer Neg Hx   . Pancreatic cancer Neg Hx   . Rectal cancer Neg Hx   . Stomach cancer Neg Hx     Social History   Tobacco Use  . Smoking status: Never Smoker  . Smokeless tobacco: Never Used  Vaping Use  . Vaping Use: Never used  Substance Use Topics  . Alcohol use: No    Alcohol/week: 0.0 standard drinks  . Drug use: No    Home Medications Prior to Admission medications   Medication Sig Start Date End Date Taking? Authorizing Provider  amitriptyline (ELAVIL) 150 MG tablet TAKE 1 TABLET AT BEDTIME 09/14/20   [name], [name] C, NP  SUMAtriptan (IMITREX) 25 MG tablet TAKE ONE TABLET BY MOUTH ONCE FOR HEADACHE. MAY REPEAT IN 2 HOURS IF HEADACHE PERSISTS OR RECURS. 09/14/20   [name], [name] C, NP  acetaminophen (TYLENOL) 500 MG tablet Take 1,000 mg by mouth every evening.     [provider]  ascorbic acid (VITAMIN C) 500 MG tablet Take 1 tablet (500 mg total) by mouth 2 (two) times daily. 09/26/19   Margit Hanks, MD  aspirin EC 81 MG tablet Take 81 mg by mouth in the morning and at bedtime.    [provider]  carboxymethylcellulose (REFRESH PLUS) 0.5 % SOLN Place 2 drops into both eyes 2 (two) times daily as needed (dry eyes).     [provider]  citalopram (CELEXA) 20 MG tablet TAKE 1 TABLET  BY MOUTH EVERY DAY Patient taking differently: Take 20 mg by mouth daily. 06/28/20   [name], [name] C, NP  citalopram (CELEXA) 40 MG tablet Take 0.5 tablets (20 mg total) by mouth daily. Patient not taking: Reported on 09/06/2020 08/16/20   [name], [name] C, NP  [employer] D3 50 MCG (2000 UT) CAPS TAKE 1 CAPSULE BY MOUTH EVERY DAY Patient taking differently: Take 2,000 Units by mouth daily. 11/08/19   [name], [name], NP  fluticasone (  FLONASE) 50 MCG/ACT nasal spray Place 2 sprays into both nostrils daily as needed for allergies or rhinitis. 02/23/20   [name], [name] C, NP  furosemide (LASIX) 40 MG tablet Take 1 tablet (40 mg total) by mouth daily. 04/03/20   [name], [name] C, NP  lidocaine (LIDODERM) 5 % Place 1 patch onto the skin daily. Remove & Discard patch within 12 hours or as directed by MD 08/21/20   Haskel Schroeder, PA-C  loratadine (CLARITIN) 10 MG tablet TAKE 1 TABLET BY MOUTH EVERY DAY Patient taking differently: Take 10 mg by mouth daily. 04/09/20   [name], [name] C, NP  metoprolol tartrate (LOPRESSOR) 50 MG tablet TAKE ONE TABLET BY MOUTH TWICE DAILY FOR BLOOD PRESSURE Patient taking differently: Take 50 mg by mouth 2 (two) times daily. 02/23/20   [name], [name] C, NP  oxyCODONE (ROXICODONE) 15 MG immediate release tablet Take 1 tablet (15 mg total) by mouth every 8 (eight) hours as needed for pain. 09/10/20   [name], [name] C, NP  polyethylene glycol (MIRALAX / GLYCOLAX) 17 g packet Take 17 g by mouth daily. 08/21/20   Haskel Schroeder, PA-C  potassium chloride (KLOR-CON) 10 MEQ tablet Take 1 tablet (10 mEq total) by mouth daily. 09/04/20   [name], [name] C, NP  vitamin B-12 (CYANOCOBALAMIN) 500 MCG tablet Take 1 tablet (500 mcg total) by mouth daily. 09/26/19   Margit Hanks, MD    Allergies    Patient has no known allergies.  Review of Systems   Review of Systems  Respiratory: Negative for shortness of breath.   Cardiovascular: Negative for chest pain.  Gastrointestinal:  Negative for abdominal pain.  Neurological: Negative for headaches.  All other systems reviewed and are negative.   Physical Exam Updated Vital Signs There were no vitals taken for this visit.  Physical Exam Vitals and nursing note reviewed.  Constitutional:      General: She is not in acute distress.    Appearance: She is well-developed.  HENT:     Head: Normocephalic and atraumatic.  Eyes:     Conjunctiva/sclera: Conjunctivae normal.     Pupils: Pupils are equal, round, and reactive to light.  Cardiovascular:     Rate and Rhythm: Normal rate and regular rhythm.     Heart sounds: Normal heart sounds.  Pulmonary:     Effort: Pulmonary effort is normal. No respiratory distress.     Breath sounds: Normal breath sounds.  Abdominal:     General: There is no distension.     Palpations: Abdomen is soft.     Tenderness: There is no abdominal tenderness.  Musculoskeletal:        General: No deformity. Normal range of motion.     Cervical back: Normal range of motion and neck supple.     Comments: Mild tenderness to palpation overlying the left lateral ribs and left lateral hip.  Skin:    General: Skin is warm and dry.  Neurological:     Mental Status: She is alert and oriented to person, place, and time.     ED Results / Procedures / Treatments   Labs (all labs ordered are listed, but only abnormal results are displayed) Labs Reviewed - No data to display  EKG EKG Interpretation  Date/Time:  Tuesday September 18 2020 14:07:43 EDT Ventricular Rate:  94 PR Interval:    QRS Duration: 106 QT Interval:  384 QTC Calculation: 481 R Axis:   53 Text Interpretation: Sinus rhythm Low voltage, precordial leads  Borderline repolarization abnormality Confirmed by Kristine Royal [phone]) on 09/18/2020 2:09:47 PM   Radiology No results found.  Procedures Procedures   Medications Ordered in ED Medications - No data to display  ED Course  I have reviewed the triage vital signs and  the nursing notes.  Pertinent labs & imaging results that were available during my care of the patient were reviewed by me and considered in my medical decision making (see chart for details).    MDM Rules/Calculators/A&P                          MDM  Screen complete  Melinda [name] was evaluated in Emergency Department on 09/18/2020 for the symptoms described in the history of present illness. She was evaluated in the context of the global COVID-19 pandemic, which necessitated consideration that the patient might be at risk for infection with the SARS-CoV-2 virus that causes COVID-19. Institutional protocols and algorithms that pertain to the evaluation of patients at risk for COVID-19 are in a state of rapid change based on information released by regulatory bodies including the CDC and federal and state organizations. These policies and algorithms were followed during the patient's care in the ED.   Patient is presenting after fall.  Patient with multiple recent frequent falls.  Patient is complaining of discomfort to the left ribs and hip.  She may have struck her head.  CT and plain film imaging pending.  Patient has been evaluated for possible placement previously.  She declined same.  She would prefer to go home.   Final Clinical Impression(s) / ED Diagnoses Final diagnoses:  Fall, initial encounter    Rx / DC Orders ED Discharge Orders    None       Wynetta Fines, MD 09/19/20 1404

## 2020-09-18 NOTE — ED Provider Notes (Signed)
Signout note  70 year old lady after mechanical fall complaining of left hip, shoulder pain.  CT head, C-spine, chest x-ray and hip x-ray pending.  3:00 PM Received signout  4:16 PM Reassessed patient, pain is improved.  Imaging is negative for acute pathology.  CXR with possible subacute single rib fracture.  Vital signs are stable and patient is well-appearing, believe she is appropriate for outpatient management.  Will ask case management to evaluate patient to ensure home health is set up.  Patient recently boarding in ER at [facility] for similar situation and ultimately decided against SNF placement.    Case management states that they have been in discussions with son, he is in [place] but is trying back to [place] to be present when patient gets home.  Initially patient states that she wanted to go home this evening and wait for him at home.  [facility] has declined to transport patient due to son not being home yet.  Son is expected at 9 AM per RN.  Will order home medications.  Anticipate discharge early tomorrow morning.    Milagros Loll, MD 09/18/20 2330

## 2020-09-18 NOTE — ED Notes (Signed)
[facility] came to pick up pt. Pt was very sleepy and hard to arouse. Pt was sat up in the bed and CBG was checked. CBG 76. Pt has not eaten since being here. Provided with OJ and crackers. [facility] had concerns about taking the pt home due to no one being there. This Clinical research associate spoke with Stevie Kern, MD. He states that he would order home medications and that would we keep her here until 7am and recall [facility]. Prior note says that son would be at her home at 9am.

## 2020-09-18 NOTE — Care Management (Addendum)
EDP spoke with patient and she is requesting to go home. ED RNCM contacted son Acquanetta Belling to update him, and he is agreeable with patient's decision to go home. Patient was made aware that son will be in [place] in the am. CM spoke with son about getting a PCP up there and continuing with OP Rehab for reconditioning he is agreeable with that plan. Patient will be discharged home and transported via [place].

## 2020-09-18 NOTE — ED Triage Notes (Signed)
Pt bib ems for mechanical fall. C/o left hip and shoulder pain. Pt denies blood thinners.hitting head or loc. V/S

## 2020-09-18 NOTE — ED Notes (Signed)
Contacted pts son derrick to discuss patients transportation home via [facility] and discharge instructions. Pts son confirmed pts address.

## 2020-09-18 NOTE — ED Notes (Signed)
Patient transported to CT 

## 2020-09-18 NOTE — Discharge Instructions (Addendum)
Follow-up with your primary doctor.  Return to ER if you have any further falls, chest pain, difficulty breathing or other new concerning symptom. Need to increase potassium in your diet.  Will be important to contact social work and get a regular doctor when you arrive in [place] to help with your care needs.

## 2020-09-18 NOTE — ED Notes (Signed)
[facility] called by rn. Pt is on list.

## 2020-09-18 NOTE — Care Management (Addendum)
ED RNCM contacted patient's son Acquanetta Belling [phone], he states that he is on his way from [place] to pick his mother up and the plan was for her to wait until she received her motorize wheel. Patient's w/c was delivered today and the delivery person called EMS after finding her on the floor, and  did not feel comfortable leaving her.  RNCM have also made several attempts to contact daughter [place]. RNCM will make further attempts to contact her.

## 2020-09-19 LAB — CBC WITH DIFFERENTIAL/PLATELET
Abs Immature Granulocytes: 0.23 10*3/uL — ABNORMAL HIGH (ref 0.00–0.07)
Basophils Absolute: 0.1 10*3/uL (ref 0.0–0.1)
Basophils Relative: 1 %
Eosinophils Absolute: 0.1 10*3/uL (ref 0.0–0.5)
Eosinophils Relative: 1 %
HCT: 40.4 % (ref 36.0–46.0)
Hemoglobin: 12.7 g/dL (ref 12.0–15.0)
Immature Granulocytes: 2 %
Lymphocytes Relative: 15 %
Lymphs Abs: 2.2 10*3/uL (ref 0.7–4.0)
MCH: 27.6 pg (ref 26.0–34.0)
MCHC: 31.4 g/dL (ref 30.0–36.0)
MCV: 87.8 fL (ref 80.0–100.0)
Monocytes Absolute: 2.7 10*3/uL — ABNORMAL HIGH (ref 0.1–1.0)
Monocytes Relative: 18 %
Neutro Abs: 9.5 10*3/uL — ABNORMAL HIGH (ref 1.7–7.7)
Neutrophils Relative %: 63 %
Platelets: 382 10*3/uL (ref 150–400)
RBC: 4.6 MIL/uL (ref 3.87–5.11)
RDW: 14.2 % (ref 11.5–15.5)
WBC: 14.7 10*3/uL — ABNORMAL HIGH (ref 4.0–10.5)
nRBC: 0 % (ref 0.0–0.2)

## 2020-09-19 LAB — URINALYSIS, MICROSCOPIC (REFLEX): RBC / HPF: NONE SEEN RBC/hpf (ref 0–5)

## 2020-09-19 LAB — URINALYSIS, ROUTINE W REFLEX MICROSCOPIC
Bilirubin Urine: NEGATIVE
Glucose, UA: NEGATIVE mg/dL
Hgb urine dipstick: NEGATIVE
Ketones, ur: NEGATIVE mg/dL
Nitrite: POSITIVE — AB
Protein, ur: NEGATIVE mg/dL
Specific Gravity, Urine: >1.03 — ABNORMAL HIGH (ref 1.005–1.030)
pH: 6 (ref 5.0–8.0)

## 2020-09-19 LAB — BASIC METABOLIC PANEL
Anion gap: 13 (ref 5–15)
BUN: 15 mg/dL (ref 8–23)
CO2: 25 mmol/L (ref 22–32)
Calcium: 9.3 mg/dL (ref 8.9–10.3)
Chloride: 98 mmol/L (ref 98–111)
Creatinine, Ser: 1.04 mg/dL — ABNORMAL HIGH (ref 0.44–1.00)
GFR, Estimated: 59 mL/min — ABNORMAL LOW (ref >60)
Glucose, Bld: 120 mg/dL — ABNORMAL HIGH (ref 70–99)
Potassium: 3.1 mmol/L — ABNORMAL LOW (ref 3.5–5.1)
Sodium: 136 mmol/L (ref 135–145)

## 2020-09-19 LAB — MAGNESIUM: Magnesium: 1.9 mg/dL (ref 1.7–2.4)

## 2020-09-19 MED ORDER — OXYCODONE HCL 5 MG PO TABS
15.0000 mg | ORAL_TABLET | Freq: Once | ORAL | Status: AC
Start: 2020-09-19 — End: 2020-09-19
  Administered 2020-09-19: 15 mg via ORAL
  Filled 2020-09-19: qty 3

## 2020-09-19 MED ORDER — POTASSIUM CHLORIDE CRYS ER 20 MEQ PO TBCR
40.0000 meq | EXTENDED_RELEASE_TABLET | Freq: Once | ORAL | Status: AC
  Administered 2020-09-19: 40 meq via ORAL
  Filled 2020-09-19: qty 2

## 2020-09-19 NOTE — ED Notes (Signed)
Pt friendly, asking what time it is.  Pt took medications without any resistance. Pt asked what time it was and is feeding herself her breakfast.  Pt appears to have a good appetite.

## 2020-09-19 NOTE — ED Provider Notes (Signed)
Patient was here waiting on P. Laurann [place] to be transported home.  In the middle of the night when they came she was to groggy and they refused to transport her.  There is someone here to take her now but they are requesting physician evaluation.  Patient was seen yesterday after being transported from home due to weakness and another fall.  This will be the fourth fall in approximately 2 weeks.  Patient was seen here and discharged approximately 10 days ago and at that time was found to have hypokalemia which did improve with treatment but there was no skilled facility that was available for her and she went home after 3 days.  Patient's son is driving from [place] today and planning on taking her back there however patient just reports she is so weak she now cannot even stand and transfer to her wheelchair which her son acknowledges and still wants to take her home however there were labs ordered when she initially arrived here but they were never done.  Will ensure that she has no evidence today of recurrent hypokalemia causing her weakness.  Also urine done on 09/06/2020 with concern for possible infection.  Will repeat UA. Patient otherwise is mentating normally.  She does have chronic pain and has not had any of her medication for pain in 24 hours so she was given a dose of her oxycodone.  Blood pressure and oxygen saturation while I am in the room is all within normal limits.  Patient's CBC today with a leukocytosis of 14.7 of unknown significance, renal function is about the same with a creatinine of 1.04.  Mild hypokalemia today of 3.1 and patient was given oral replacement.  May be coming from chronic Lasix use.  Patient denies any chest pain or shortness of breath and does have some evidence of swelling in her lower extremities but this seems more chronic.  1:55 PM UA today nitrite positive but only trace leukocytes and 5-10 white cells.  There is many bacteria but this appears more chronic.  Again  leukocytosis of unknown origin.  Patient is afebrile.  Urine culture was obtained.  Patient son is now at bedside and reports he is taking her back to [place].  He is overwhelmed and understands she does need significant care.  Feel that she is debilitated due to poor oral intake, recent falls and 3-day stay in the emergency room.  She will need home health upon arrival there and he is also looking for a longer term option for her.  Speaking with the patient she is ready to go with her son.  She did receive 1 dose of pain medication and is fairly sleepy here but otherwise seems appropriate and will wake up to voice.  MDM Number of Diagnoses or Management Options   Amount and/or Complexity of Data Reviewed Clinical lab tests: ordered and reviewed Independent visualization of images, tracings, or specimens: yes      Gwyneth Sprout, MD 09/19/20 1359

## 2020-09-19 NOTE — ED Notes (Signed)
Pt assisted to car with 2 assist.  Son assures staff that he will be able to move pt into the house and that the house is set up for her in [place].

## 2020-09-19 NOTE — ED Notes (Signed)
Pt unable to be assisted to bedside commode with 2 assist.  Pt ao x 4, states she just has no strength.  Pt lives by herself and son is presently driving from [place].  Spoke with MD.  Labs have not been sent.  Spoke with son.  [facility] will be cancelled and he will pick up pt here, if all tests are normal.

## 2020-09-19 NOTE — ED Notes (Signed)
Pt refused all medication.  Refused to eat breakfast.  Stated, "Leave me alone".

## 2020-09-19 NOTE — Discharge Planning (Signed)
Pt to discharge home ([place]) today with son.

## 2020-09-21 LAB — URINE CULTURE: Culture: >=100000 — AB

## 2020-10-02 ENCOUNTER — Ambulatory Visit: Payer: Medicare HMO | Admitting: Family

## 2020-10-08 DIAGNOSIS — M797 Fibromyalgia: Secondary | ICD-10-CM | POA: Diagnosis not present

## 2020-10-08 DIAGNOSIS — I10 Essential (primary) hypertension: Secondary | ICD-10-CM | POA: Diagnosis not present

## 2020-10-08 DIAGNOSIS — Z9989 Dependence on other enabling machines and devices: Secondary | ICD-10-CM | POA: Diagnosis not present

## 2020-10-08 DIAGNOSIS — R079 Chest pain, unspecified: Secondary | ICD-10-CM | POA: Diagnosis not present

## 2020-10-08 DIAGNOSIS — R9389 Abnormal findings on diagnostic imaging of other specified body structures: Secondary | ICD-10-CM | POA: Diagnosis not present

## 2020-10-08 DIAGNOSIS — I7300 Raynaud's syndrome without gangrene: Secondary | ICD-10-CM | POA: Diagnosis not present

## 2020-10-08 DIAGNOSIS — R0789 Other chest pain: Secondary | ICD-10-CM | POA: Diagnosis not present

## 2020-10-09 DIAGNOSIS — R262 Difficulty in walking, not elsewhere classified: Secondary | ICD-10-CM | POA: Diagnosis not present

## 2020-10-09 DIAGNOSIS — R600 Localized edema: Secondary | ICD-10-CM | POA: Diagnosis not present

## 2020-10-09 DIAGNOSIS — R079 Chest pain, unspecified: Secondary | ICD-10-CM | POA: Diagnosis not present

## 2020-10-09 DIAGNOSIS — Z743 Need for continuous supervision: Secondary | ICD-10-CM | POA: Diagnosis not present

## 2020-10-11 ENCOUNTER — Other Ambulatory Visit: Payer: Self-pay

## 2020-10-11 DIAGNOSIS — G894 Chronic pain syndrome: Secondary | ICD-10-CM

## 2020-10-11 DIAGNOSIS — G8929 Other chronic pain: Secondary | ICD-10-CM

## 2020-10-11 DIAGNOSIS — M797 Fibromyalgia: Secondary | ICD-10-CM

## 2020-10-11 NOTE — Telephone Encounter (Signed)
Message left on clinical intake voicemail:   Requesting refill on Oxycodone, RX last filled on 09/10/2020  I returned call and left message on voicemail informing patient she has no showed 2 appointments within the last 90 days with [name] and that appointment needs to rescheduled prior to next refill request as we need her to sign a opioid treatment agreement

## 2020-10-16 NOTE — Telephone Encounter (Signed)
Patient called and left message on voicemail and  stated that she was just seen for the last refill and requesting medication to be filled.   Rx pended and sent to [facility] for approval.

## 2020-10-17 MED ORDER — OXYCODONE HCL 15 MG PO TABS: 15.0000 mg | ORAL_TABLET | Freq: Three times a day (TID) | ORAL | 0 refills | Status: DC | PRN

## 2020-10-19 DIAGNOSIS — M5442 Lumbago with sciatica, left side: Secondary | ICD-10-CM | POA: Diagnosis not present

## 2020-10-19 DIAGNOSIS — G63 Polyneuropathy in diseases classified elsewhere: Secondary | ICD-10-CM | POA: Diagnosis not present

## 2020-10-19 DIAGNOSIS — R29898 Other symptoms and signs involving the musculoskeletal system: Secondary | ICD-10-CM | POA: Diagnosis not present

## 2020-10-24 ENCOUNTER — Telehealth: Payer: Self-pay | Admitting: *Deleted

## 2020-10-24 NOTE — Telephone Encounter (Signed)
Patient called with Concerns.   1. Wants a Rx faxed to [facility] for her Incontinence supplies.   2. Stated that she has discussed this at several appointments of wanting Home Health PT and a Home Health Aid due to falls.  Stated that she is currently staying with her son in [place] and he is unable to be with her 24/7.   Please Advise.  (Forwarded to [place] due to [employer] out of office)

## 2020-10-25 NOTE — Telephone Encounter (Signed)
LMOM to return call.

## 2020-10-25 NOTE — Telephone Encounter (Signed)
Okay for incontinence supplies, she will need a face to face visit for home health orders, if she would like we can do virtual visit

## 2020-10-29 DIAGNOSIS — A4902 Methicillin resistant Staphylococcus aureus infection, unspecified site: Secondary | ICD-10-CM | POA: Diagnosis not present

## 2020-10-29 DIAGNOSIS — I509 Heart failure, unspecified: Secondary | ICD-10-CM | POA: Diagnosis not present

## 2020-10-29 DIAGNOSIS — I110 Hypertensive heart disease with heart failure: Secondary | ICD-10-CM | POA: Diagnosis not present

## 2020-10-29 DIAGNOSIS — L0390 Cellulitis, unspecified: Secondary | ICD-10-CM | POA: Diagnosis not present

## 2020-10-29 DIAGNOSIS — R1032 Left lower quadrant pain: Secondary | ICD-10-CM | POA: Diagnosis not present

## 2020-10-29 DIAGNOSIS — S91301A Unspecified open wound, right foot, initial encounter: Secondary | ICD-10-CM | POA: Diagnosis not present

## 2020-10-29 DIAGNOSIS — M797 Fibromyalgia: Secondary | ICD-10-CM | POA: Diagnosis not present

## 2020-10-29 DIAGNOSIS — M545 Low back pain, unspecified: Secondary | ICD-10-CM | POA: Diagnosis not present

## 2020-10-29 DIAGNOSIS — M549 Dorsalgia, unspecified: Secondary | ICD-10-CM | POA: Diagnosis not present

## 2020-10-29 DIAGNOSIS — R109 Unspecified abdominal pain: Secondary | ICD-10-CM | POA: Diagnosis not present

## 2020-10-29 DIAGNOSIS — E876 Hypokalemia: Secondary | ICD-10-CM | POA: Diagnosis not present

## 2020-10-29 DIAGNOSIS — M5430 Sciatica, unspecified side: Secondary | ICD-10-CM | POA: Diagnosis not present

## 2020-10-29 DIAGNOSIS — R509 Fever, unspecified: Secondary | ICD-10-CM | POA: Diagnosis not present

## 2020-10-29 DIAGNOSIS — I5032 Chronic diastolic (congestive) heart failure: Secondary | ICD-10-CM | POA: Diagnosis not present

## 2020-10-29 DIAGNOSIS — I739 Peripheral vascular disease, unspecified: Secondary | ICD-10-CM | POA: Diagnosis not present

## 2020-10-29 DIAGNOSIS — B9562 Methicillin resistant Staphylococcus aureus infection as the cause of diseases classified elsewhere: Secondary | ICD-10-CM | POA: Diagnosis not present

## 2020-10-29 DIAGNOSIS — E1151 Type 2 diabetes mellitus with diabetic peripheral angiopathy without gangrene: Secondary | ICD-10-CM | POA: Diagnosis not present

## 2020-10-29 DIAGNOSIS — Z743 Need for continuous supervision: Secondary | ICD-10-CM | POA: Diagnosis not present

## 2020-10-29 DIAGNOSIS — T148XXA Other injury of unspecified body region, initial encounter: Secondary | ICD-10-CM | POA: Diagnosis not present

## 2020-10-29 DIAGNOSIS — I5040 Unspecified combined systolic (congestive) and diastolic (congestive) heart failure: Secondary | ICD-10-CM | POA: Diagnosis not present

## 2020-10-29 DIAGNOSIS — L03115 Cellulitis of right lower limb: Secondary | ICD-10-CM | POA: Diagnosis not present

## 2020-10-29 DIAGNOSIS — S90421A Blister (nonthermal), right great toe, initial encounter: Secondary | ICD-10-CM | POA: Diagnosis not present

## 2020-10-29 DIAGNOSIS — E1140 Type 2 diabetes mellitus with diabetic neuropathy, unspecified: Secondary | ICD-10-CM | POA: Diagnosis not present

## 2020-10-29 DIAGNOSIS — J986 Disorders of diaphragm: Secondary | ICD-10-CM | POA: Diagnosis not present

## 2020-10-29 DIAGNOSIS — R531 Weakness: Secondary | ICD-10-CM | POA: Diagnosis not present

## 2020-10-29 DIAGNOSIS — M546 Pain in thoracic spine: Secondary | ICD-10-CM | POA: Diagnosis not present

## 2020-10-29 DIAGNOSIS — B957 Other staphylococcus as the cause of diseases classified elsewhere: Secondary | ICD-10-CM | POA: Diagnosis not present

## 2020-10-29 DIAGNOSIS — D72829 Elevated white blood cell count, unspecified: Secondary | ICD-10-CM | POA: Diagnosis not present

## 2020-10-29 DIAGNOSIS — L89613 Pressure ulcer of right heel, stage 3: Secondary | ICD-10-CM | POA: Diagnosis not present

## 2020-10-29 DIAGNOSIS — I340 Nonrheumatic mitral (valve) insufficiency: Secondary | ICD-10-CM | POA: Diagnosis not present

## 2020-10-29 DIAGNOSIS — R29898 Other symptoms and signs involving the musculoskeletal system: Secondary | ICD-10-CM | POA: Diagnosis not present

## 2020-10-29 DIAGNOSIS — I361 Nonrheumatic tricuspid (valve) insufficiency: Secondary | ICD-10-CM | POA: Diagnosis not present

## 2020-10-29 DIAGNOSIS — G8929 Other chronic pain: Secondary | ICD-10-CM | POA: Diagnosis not present

## 2020-10-29 DIAGNOSIS — L03031 Cellulitis of right toe: Secondary | ICD-10-CM | POA: Diagnosis not present

## 2020-10-30 DIAGNOSIS — L89613 Pressure ulcer of right heel, stage 3: Secondary | ICD-10-CM | POA: Diagnosis not present

## 2020-10-30 DIAGNOSIS — I5040 Unspecified combined systolic (congestive) and diastolic (congestive) heart failure: Secondary | ICD-10-CM | POA: Diagnosis not present

## 2020-10-30 DIAGNOSIS — S91301A Unspecified open wound, right foot, initial encounter: Secondary | ICD-10-CM | POA: Diagnosis not present

## 2020-10-30 DIAGNOSIS — L03115 Cellulitis of right lower limb: Secondary | ICD-10-CM | POA: Diagnosis not present

## 2020-10-30 DIAGNOSIS — I739 Peripheral vascular disease, unspecified: Secondary | ICD-10-CM | POA: Diagnosis not present

## 2020-10-30 DIAGNOSIS — L03031 Cellulitis of right toe: Secondary | ICD-10-CM | POA: Diagnosis not present

## 2020-10-30 DIAGNOSIS — M797 Fibromyalgia: Secondary | ICD-10-CM | POA: Diagnosis not present

## 2020-10-30 DIAGNOSIS — I110 Hypertensive heart disease with heart failure: Secondary | ICD-10-CM | POA: Diagnosis not present

## 2020-10-30 DIAGNOSIS — R29898 Other symptoms and signs involving the musculoskeletal system: Secondary | ICD-10-CM | POA: Diagnosis not present

## 2020-10-31 DIAGNOSIS — I340 Nonrheumatic mitral (valve) insufficiency: Secondary | ICD-10-CM | POA: Diagnosis not present

## 2020-10-31 DIAGNOSIS — L03115 Cellulitis of right lower limb: Secondary | ICD-10-CM | POA: Diagnosis not present

## 2020-10-31 DIAGNOSIS — L89613 Pressure ulcer of right heel, stage 3: Secondary | ICD-10-CM | POA: Diagnosis not present

## 2020-10-31 DIAGNOSIS — I361 Nonrheumatic tricuspid (valve) insufficiency: Secondary | ICD-10-CM | POA: Diagnosis not present

## 2020-10-31 DIAGNOSIS — M797 Fibromyalgia: Secondary | ICD-10-CM | POA: Diagnosis not present

## 2020-10-31 DIAGNOSIS — I739 Peripheral vascular disease, unspecified: Secondary | ICD-10-CM | POA: Diagnosis not present

## 2020-10-31 DIAGNOSIS — I509 Heart failure, unspecified: Secondary | ICD-10-CM | POA: Diagnosis not present

## 2020-11-02 DIAGNOSIS — L89613 Pressure ulcer of right heel, stage 3: Secondary | ICD-10-CM | POA: Diagnosis not present

## 2020-11-02 DIAGNOSIS — M549 Dorsalgia, unspecified: Secondary | ICD-10-CM | POA: Diagnosis not present

## 2020-11-02 DIAGNOSIS — L03115 Cellulitis of right lower limb: Secondary | ICD-10-CM | POA: Diagnosis not present

## 2020-11-02 DIAGNOSIS — I739 Peripheral vascular disease, unspecified: Secondary | ICD-10-CM | POA: Diagnosis not present

## 2020-11-02 DIAGNOSIS — M797 Fibromyalgia: Secondary | ICD-10-CM | POA: Diagnosis not present

## 2020-11-02 DIAGNOSIS — L0390 Cellulitis, unspecified: Secondary | ICD-10-CM | POA: Diagnosis not present

## 2020-11-02 DIAGNOSIS — G8929 Other chronic pain: Secondary | ICD-10-CM | POA: Diagnosis not present

## 2020-11-02 DIAGNOSIS — S91301A Unspecified open wound, right foot, initial encounter: Secondary | ICD-10-CM | POA: Diagnosis not present

## 2020-11-03 DIAGNOSIS — A4902 Methicillin resistant Staphylococcus aureus infection, unspecified site: Secondary | ICD-10-CM | POA: Diagnosis not present

## 2020-11-03 DIAGNOSIS — M545 Low back pain, unspecified: Secondary | ICD-10-CM | POA: Diagnosis not present

## 2020-11-04 DIAGNOSIS — G8929 Other chronic pain: Secondary | ICD-10-CM | POA: Diagnosis not present

## 2020-11-04 DIAGNOSIS — M549 Dorsalgia, unspecified: Secondary | ICD-10-CM | POA: Diagnosis not present

## 2020-11-04 DIAGNOSIS — S91301A Unspecified open wound, right foot, initial encounter: Secondary | ICD-10-CM | POA: Diagnosis not present

## 2020-11-04 DIAGNOSIS — A4902 Methicillin resistant Staphylococcus aureus infection, unspecified site: Secondary | ICD-10-CM | POA: Diagnosis not present

## 2020-11-04 DIAGNOSIS — L03115 Cellulitis of right lower limb: Secondary | ICD-10-CM | POA: Diagnosis not present

## 2020-11-04 DIAGNOSIS — M797 Fibromyalgia: Secondary | ICD-10-CM | POA: Diagnosis not present

## 2020-11-04 DIAGNOSIS — M545 Low back pain, unspecified: Secondary | ICD-10-CM | POA: Diagnosis not present

## 2020-11-05 DIAGNOSIS — G8929 Other chronic pain: Secondary | ICD-10-CM | POA: Diagnosis not present

## 2020-11-05 DIAGNOSIS — S91301A Unspecified open wound, right foot, initial encounter: Secondary | ICD-10-CM | POA: Diagnosis not present

## 2020-11-05 DIAGNOSIS — M797 Fibromyalgia: Secondary | ICD-10-CM | POA: Diagnosis not present

## 2020-11-05 DIAGNOSIS — L03115 Cellulitis of right lower limb: Secondary | ICD-10-CM | POA: Diagnosis not present

## 2020-11-05 DIAGNOSIS — M549 Dorsalgia, unspecified: Secondary | ICD-10-CM | POA: Diagnosis not present

## 2020-11-06 DIAGNOSIS — I739 Peripheral vascular disease, unspecified: Secondary | ICD-10-CM | POA: Diagnosis not present

## 2020-11-06 DIAGNOSIS — M549 Dorsalgia, unspecified: Secondary | ICD-10-CM | POA: Diagnosis not present

## 2020-11-06 DIAGNOSIS — S91301A Unspecified open wound, right foot, initial encounter: Secondary | ICD-10-CM | POA: Diagnosis not present

## 2020-11-06 DIAGNOSIS — M797 Fibromyalgia: Secondary | ICD-10-CM | POA: Diagnosis not present

## 2020-11-06 DIAGNOSIS — L03115 Cellulitis of right lower limb: Secondary | ICD-10-CM | POA: Diagnosis not present

## 2020-11-06 DIAGNOSIS — G8929 Other chronic pain: Secondary | ICD-10-CM | POA: Diagnosis not present

## 2020-11-06 DIAGNOSIS — L89613 Pressure ulcer of right heel, stage 3: Secondary | ICD-10-CM | POA: Diagnosis not present

## 2020-11-06 DIAGNOSIS — D72829 Elevated white blood cell count, unspecified: Secondary | ICD-10-CM | POA: Diagnosis not present

## 2020-11-07 DIAGNOSIS — R509 Fever, unspecified: Secondary | ICD-10-CM | POA: Diagnosis not present

## 2020-11-07 DIAGNOSIS — Z743 Need for continuous supervision: Secondary | ICD-10-CM | POA: Diagnosis not present

## 2020-11-07 DIAGNOSIS — R531 Weakness: Secondary | ICD-10-CM | POA: Diagnosis not present

## 2020-11-12 ENCOUNTER — Other Ambulatory Visit: Payer: Self-pay

## 2020-11-12 ENCOUNTER — Telehealth: Payer: Self-pay

## 2020-11-12 NOTE — Patient Outreach (Signed)
[facility]) Care Management  11/12/2020  Melinda [name] 03/06/1951 [id number]   Referral Date: 5.11.22 Referral Source: Prior remote health Referral Reason: Prior remote Health   Outreach Attempt: Telephone call to patient.  No answer.  HIPAA compliant voice message left. Telephone call to [facility] son Acquanetta Belling.  He states patient is in [place] with him and has been since March of 2022.  Plan: RN CM will close case as patient is out of state.   [name], RN, MSN [facility] Care Management Care Management Coordinator Direct Line [phone] Toll Free: [phone]  Fax: [phone]

## 2020-11-14 ENCOUNTER — Telehealth: Payer: Self-pay

## 2020-11-14 DIAGNOSIS — G894 Chronic pain syndrome: Secondary | ICD-10-CM

## 2020-11-14 DIAGNOSIS — G8929 Other chronic pain: Secondary | ICD-10-CM

## 2020-11-14 DIAGNOSIS — M5442 Lumbago with sciatica, left side: Secondary | ICD-10-CM

## 2020-11-14 DIAGNOSIS — M797 Fibromyalgia: Secondary | ICD-10-CM

## 2020-11-14 NOTE — Telephone Encounter (Signed)
Incoming call received from patient stating she is out of oxycodone as she had to take her own supply while being hospitalized.  I informed patient that despite being hospitalized she should not take medication more than prescribed and according to our records she is not due for a refill until 11/16/2020. We have a contract on file that indicates patient will not take more medication than prescribed nor will she request early.  Patient verbalized understanding and states she wants the provider to know she is completely out and she is ok if she has to wait until 11/16/2020

## 2020-11-15 NOTE — Telephone Encounter (Signed)
Late entry. Called patient 11/12/2020 to notify her that Oxycodone isnt due until 11/16/2020. Patient didn't answer so voicemail was left. Patient also called on 11/13/2020 and 11/14/2020 to be notified of the same concern. Left voicemail. Spoke to Ella Bodo, NP due to PCP [name], [name], NP being out of office. Per Ella Bodo, NP, patient will receive refill on 11/16/2020 as documentation shows due date. Please refer to Creig Hines, CMA prior telephone encounter.

## 2020-11-16 MED ORDER — OXYCODONE HCL 15 MG PO TABS: 15.0000 mg | ORAL_TABLET | Freq: Three times a day (TID) | ORAL | 0 refills | Status: DC | PRN

## 2020-11-16 NOTE — Telephone Encounter (Signed)
Refer to previous telephone encounter by Creig Hines, CMA.

## 2020-11-16 NOTE — Telephone Encounter (Signed)
Patient Is now due for refill on Oxycodone. Medication pend and sent to Ella Bodo, NP due to PCP [name], [name], NP being out of office.

## 2020-11-18 DIAGNOSIS — M5442 Lumbago with sciatica, left side: Secondary | ICD-10-CM | POA: Diagnosis not present

## 2020-11-18 DIAGNOSIS — R29898 Other symptoms and signs involving the musculoskeletal system: Secondary | ICD-10-CM | POA: Diagnosis not present

## 2020-11-18 DIAGNOSIS — G63 Polyneuropathy in diseases classified elsewhere: Secondary | ICD-10-CM | POA: Diagnosis not present

## 2020-11-24 DIAGNOSIS — L89610 Pressure ulcer of right heel, unstageable: Secondary | ICD-10-CM | POA: Diagnosis not present

## 2020-11-24 DIAGNOSIS — R109 Unspecified abdominal pain: Secondary | ICD-10-CM | POA: Diagnosis not present

## 2020-11-24 DIAGNOSIS — E876 Hypokalemia: Secondary | ICD-10-CM | POA: Diagnosis not present

## 2020-11-24 DIAGNOSIS — L8990 Pressure ulcer of unspecified site, unspecified stage: Secondary | ICD-10-CM | POA: Diagnosis not present

## 2020-11-24 DIAGNOSIS — R531 Weakness: Secondary | ICD-10-CM | POA: Diagnosis not present

## 2020-11-24 DIAGNOSIS — L089 Local infection of the skin and subcutaneous tissue, unspecified: Secondary | ICD-10-CM | POA: Diagnosis not present

## 2020-11-24 DIAGNOSIS — R100 Acute abdomen: Secondary | ICD-10-CM | POA: Diagnosis not present

## 2020-11-24 DIAGNOSIS — R1030 Lower abdominal pain, unspecified: Secondary | ICD-10-CM | POA: Diagnosis not present

## 2020-11-24 DIAGNOSIS — L89619 Pressure ulcer of right heel, unspecified stage: Secondary | ICD-10-CM | POA: Diagnosis not present

## 2020-11-24 DIAGNOSIS — N179 Acute kidney failure, unspecified: Secondary | ICD-10-CM | POA: Diagnosis not present

## 2020-11-24 DIAGNOSIS — I959 Hypotension, unspecified: Secondary | ICD-10-CM | POA: Diagnosis not present

## 2020-11-25 DIAGNOSIS — L97409 Non-pressure chronic ulcer of unspecified heel and midfoot with unspecified severity: Secondary | ICD-10-CM | POA: Diagnosis not present

## 2020-11-25 DIAGNOSIS — Z299 Encounter for prophylactic measures, unspecified: Secondary | ICD-10-CM | POA: Diagnosis not present

## 2020-11-25 DIAGNOSIS — R5381 Other malaise: Secondary | ICD-10-CM | POA: Diagnosis not present

## 2020-11-25 DIAGNOSIS — R109 Unspecified abdominal pain: Secondary | ICD-10-CM | POA: Diagnosis not present

## 2020-11-26 DIAGNOSIS — L089 Local infection of the skin and subcutaneous tissue, unspecified: Secondary | ICD-10-CM | POA: Diagnosis not present

## 2020-11-26 DIAGNOSIS — R109 Unspecified abdominal pain: Secondary | ICD-10-CM | POA: Diagnosis not present

## 2020-11-26 DIAGNOSIS — Z299 Encounter for prophylactic measures, unspecified: Secondary | ICD-10-CM | POA: Diagnosis not present

## 2020-11-26 DIAGNOSIS — L97409 Non-pressure chronic ulcer of unspecified heel and midfoot with unspecified severity: Secondary | ICD-10-CM | POA: Diagnosis not present

## 2020-11-26 DIAGNOSIS — L03116 Cellulitis of left lower limb: Secondary | ICD-10-CM | POA: Diagnosis not present

## 2020-11-26 DIAGNOSIS — R1031 Right lower quadrant pain: Secondary | ICD-10-CM | POA: Diagnosis not present

## 2020-11-26 DIAGNOSIS — R5381 Other malaise: Secondary | ICD-10-CM | POA: Diagnosis not present

## 2020-11-27 DIAGNOSIS — R109 Unspecified abdominal pain: Secondary | ICD-10-CM | POA: Diagnosis not present

## 2020-11-27 DIAGNOSIS — L089 Local infection of the skin and subcutaneous tissue, unspecified: Secondary | ICD-10-CM | POA: Diagnosis not present

## 2020-11-27 DIAGNOSIS — L89619 Pressure ulcer of right heel, unspecified stage: Secondary | ICD-10-CM | POA: Diagnosis not present

## 2020-11-27 DIAGNOSIS — Z299 Encounter for prophylactic measures, unspecified: Secondary | ICD-10-CM | POA: Diagnosis not present

## 2020-11-27 DIAGNOSIS — R5381 Other malaise: Secondary | ICD-10-CM | POA: Diagnosis not present

## 2020-11-27 DIAGNOSIS — L97409 Non-pressure chronic ulcer of unspecified heel and midfoot with unspecified severity: Secondary | ICD-10-CM | POA: Diagnosis not present

## 2020-11-27 DIAGNOSIS — L03116 Cellulitis of left lower limb: Secondary | ICD-10-CM | POA: Diagnosis not present

## 2020-11-28 DIAGNOSIS — L089 Local infection of the skin and subcutaneous tissue, unspecified: Secondary | ICD-10-CM | POA: Diagnosis not present

## 2020-11-28 DIAGNOSIS — K279 Peptic ulcer, site unspecified, unspecified as acute or chronic, without hemorrhage or perforation: Secondary | ICD-10-CM | POA: Diagnosis not present

## 2020-11-28 DIAGNOSIS — Z792 Long term (current) use of antibiotics: Secondary | ICD-10-CM | POA: Diagnosis not present

## 2020-11-28 DIAGNOSIS — Z452 Encounter for adjustment and management of vascular access device: Secondary | ICD-10-CM | POA: Diagnosis not present

## 2020-11-28 DIAGNOSIS — R262 Difficulty in walking, not elsewhere classified: Secondary | ICD-10-CM | POA: Diagnosis not present

## 2020-11-28 DIAGNOSIS — R531 Weakness: Secondary | ICD-10-CM | POA: Diagnosis not present

## 2020-11-28 DIAGNOSIS — I959 Hypotension, unspecified: Secondary | ICD-10-CM | POA: Diagnosis not present

## 2020-11-28 DIAGNOSIS — M4800 Spinal stenosis, site unspecified: Secondary | ICD-10-CM | POA: Diagnosis not present

## 2020-11-28 DIAGNOSIS — I110 Hypertensive heart disease with heart failure: Secondary | ICD-10-CM | POA: Diagnosis not present

## 2020-11-28 DIAGNOSIS — L89619 Pressure ulcer of right heel, unspecified stage: Secondary | ICD-10-CM | POA: Diagnosis not present

## 2020-11-28 DIAGNOSIS — B965 Pseudomonas (aeruginosa) (mallei) (pseudomallei) as the cause of diseases classified elsewhere: Secondary | ICD-10-CM | POA: Diagnosis not present

## 2020-11-28 DIAGNOSIS — M80051G Age-related osteoporosis with current pathological fracture, right femur, subsequent encounter for fracture with delayed healing: Secondary | ICD-10-CM | POA: Diagnosis not present

## 2020-11-28 DIAGNOSIS — D649 Anemia, unspecified: Secondary | ICD-10-CM | POA: Diagnosis not present

## 2020-11-28 DIAGNOSIS — R109 Unspecified abdominal pain: Secondary | ICD-10-CM | POA: Diagnosis not present

## 2020-11-28 DIAGNOSIS — F338 Other recurrent depressive disorders: Secondary | ICD-10-CM | POA: Diagnosis not present

## 2020-11-28 DIAGNOSIS — E860 Dehydration: Secondary | ICD-10-CM | POA: Diagnosis not present

## 2020-11-28 DIAGNOSIS — I5042 Chronic combined systolic (congestive) and diastolic (congestive) heart failure: Secondary | ICD-10-CM | POA: Diagnosis not present

## 2020-11-28 DIAGNOSIS — E162 Hypoglycemia, unspecified: Secondary | ICD-10-CM | POA: Diagnosis not present

## 2020-11-28 DIAGNOSIS — Z743 Need for continuous supervision: Secondary | ICD-10-CM | POA: Diagnosis not present

## 2020-11-28 DIAGNOSIS — E876 Hypokalemia: Secondary | ICD-10-CM | POA: Diagnosis not present

## 2020-11-28 DIAGNOSIS — M797 Fibromyalgia: Secondary | ICD-10-CM | POA: Diagnosis not present

## 2020-11-28 DIAGNOSIS — L89612 Pressure ulcer of right heel, stage 2: Secondary | ICD-10-CM | POA: Diagnosis not present

## 2020-11-28 DIAGNOSIS — R630 Anorexia: Secondary | ICD-10-CM | POA: Diagnosis not present

## 2020-11-28 DIAGNOSIS — M86171 Other acute osteomyelitis, right ankle and foot: Secondary | ICD-10-CM | POA: Diagnosis not present

## 2020-11-28 DIAGNOSIS — L03115 Cellulitis of right lower limb: Secondary | ICD-10-CM | POA: Diagnosis not present

## 2020-11-28 DIAGNOSIS — L8990 Pressure ulcer of unspecified site, unspecified stage: Secondary | ICD-10-CM | POA: Diagnosis not present

## 2020-11-28 DIAGNOSIS — L03116 Cellulitis of left lower limb: Secondary | ICD-10-CM | POA: Diagnosis not present

## 2020-11-28 DIAGNOSIS — R5381 Other malaise: Secondary | ICD-10-CM | POA: Diagnosis not present

## 2020-11-28 DIAGNOSIS — M069 Rheumatoid arthritis, unspecified: Secondary | ICD-10-CM | POA: Diagnosis not present

## 2020-11-28 DIAGNOSIS — L89620 Pressure ulcer of left heel, unstageable: Secondary | ICD-10-CM | POA: Diagnosis not present

## 2020-11-28 DIAGNOSIS — N179 Acute kidney failure, unspecified: Secondary | ICD-10-CM | POA: Diagnosis not present

## 2020-11-29 DIAGNOSIS — M86171 Other acute osteomyelitis, right ankle and foot: Secondary | ICD-10-CM | POA: Diagnosis not present

## 2020-11-29 DIAGNOSIS — L089 Local infection of the skin and subcutaneous tissue, unspecified: Secondary | ICD-10-CM | POA: Diagnosis not present

## 2020-11-29 DIAGNOSIS — L8990 Pressure ulcer of unspecified site, unspecified stage: Secondary | ICD-10-CM | POA: Diagnosis not present

## 2020-11-29 DIAGNOSIS — K279 Peptic ulcer, site unspecified, unspecified as acute or chronic, without hemorrhage or perforation: Secondary | ICD-10-CM | POA: Diagnosis not present

## 2020-11-29 DIAGNOSIS — R5381 Other malaise: Secondary | ICD-10-CM | POA: Diagnosis not present

## 2020-11-29 DIAGNOSIS — L89619 Pressure ulcer of right heel, unspecified stage: Secondary | ICD-10-CM | POA: Diagnosis not present

## 2020-11-29 DIAGNOSIS — E162 Hypoglycemia, unspecified: Secondary | ICD-10-CM | POA: Diagnosis not present

## 2020-11-30 DIAGNOSIS — K279 Peptic ulcer, site unspecified, unspecified as acute or chronic, without hemorrhage or perforation: Secondary | ICD-10-CM | POA: Diagnosis not present

## 2020-11-30 DIAGNOSIS — E162 Hypoglycemia, unspecified: Secondary | ICD-10-CM | POA: Diagnosis not present

## 2020-11-30 DIAGNOSIS — M86171 Other acute osteomyelitis, right ankle and foot: Secondary | ICD-10-CM | POA: Diagnosis not present

## 2020-11-30 DIAGNOSIS — R5381 Other malaise: Secondary | ICD-10-CM | POA: Diagnosis not present

## 2020-12-01 DIAGNOSIS — R5381 Other malaise: Secondary | ICD-10-CM | POA: Diagnosis not present

## 2020-12-01 DIAGNOSIS — K279 Peptic ulcer, site unspecified, unspecified as acute or chronic, without hemorrhage or perforation: Secondary | ICD-10-CM | POA: Diagnosis not present

## 2020-12-01 DIAGNOSIS — D649 Anemia, unspecified: Secondary | ICD-10-CM | POA: Diagnosis not present

## 2020-12-01 DIAGNOSIS — E162 Hypoglycemia, unspecified: Secondary | ICD-10-CM | POA: Diagnosis not present

## 2020-12-02 DIAGNOSIS — R5381 Other malaise: Secondary | ICD-10-CM | POA: Diagnosis not present

## 2020-12-02 DIAGNOSIS — K279 Peptic ulcer, site unspecified, unspecified as acute or chronic, without hemorrhage or perforation: Secondary | ICD-10-CM | POA: Diagnosis not present

## 2020-12-02 DIAGNOSIS — D649 Anemia, unspecified: Secondary | ICD-10-CM | POA: Diagnosis not present

## 2020-12-02 DIAGNOSIS — E162 Hypoglycemia, unspecified: Secondary | ICD-10-CM | POA: Diagnosis not present

## 2020-12-03 DIAGNOSIS — K279 Peptic ulcer, site unspecified, unspecified as acute or chronic, without hemorrhage or perforation: Secondary | ICD-10-CM | POA: Diagnosis not present

## 2020-12-03 DIAGNOSIS — E162 Hypoglycemia, unspecified: Secondary | ICD-10-CM | POA: Diagnosis not present

## 2020-12-03 DIAGNOSIS — M80051G Age-related osteoporosis with current pathological fracture, right femur, subsequent encounter for fracture with delayed healing: Secondary | ICD-10-CM | POA: Diagnosis not present

## 2020-12-03 DIAGNOSIS — R5381 Other malaise: Secondary | ICD-10-CM | POA: Diagnosis not present

## 2020-12-03 DIAGNOSIS — D649 Anemia, unspecified: Secondary | ICD-10-CM | POA: Diagnosis not present

## 2020-12-04 DIAGNOSIS — F331 Major depressive disorder, recurrent, moderate: Secondary | ICD-10-CM | POA: Diagnosis not present

## 2020-12-04 DIAGNOSIS — R410 Disorientation, unspecified: Secondary | ICD-10-CM | POA: Diagnosis not present

## 2020-12-04 DIAGNOSIS — Z452 Encounter for adjustment and management of vascular access device: Secondary | ICD-10-CM | POA: Diagnosis not present

## 2020-12-04 DIAGNOSIS — L03116 Cellulitis of left lower limb: Secondary | ICD-10-CM | POA: Diagnosis not present

## 2020-12-04 DIAGNOSIS — L89613 Pressure ulcer of right heel, stage 3: Secondary | ICD-10-CM | POA: Diagnosis not present

## 2020-12-04 DIAGNOSIS — F338 Other recurrent depressive disorders: Secondary | ICD-10-CM | POA: Diagnosis not present

## 2020-12-04 DIAGNOSIS — G319 Degenerative disease of nervous system, unspecified: Secondary | ICD-10-CM | POA: Diagnosis not present

## 2020-12-04 DIAGNOSIS — M80051G Age-related osteoporosis with current pathological fracture, right femur, subsequent encounter for fracture with delayed healing: Secondary | ICD-10-CM | POA: Diagnosis not present

## 2020-12-04 DIAGNOSIS — M069 Rheumatoid arthritis, unspecified: Secondary | ICD-10-CM | POA: Diagnosis not present

## 2020-12-04 DIAGNOSIS — F4322 Adjustment disorder with anxiety: Secondary | ICD-10-CM | POA: Diagnosis not present

## 2020-12-04 DIAGNOSIS — R634 Abnormal weight loss: Secondary | ICD-10-CM | POA: Diagnosis not present

## 2020-12-04 DIAGNOSIS — M797 Fibromyalgia: Secondary | ICD-10-CM | POA: Diagnosis not present

## 2020-12-04 DIAGNOSIS — R109 Unspecified abdominal pain: Secondary | ICD-10-CM | POA: Diagnosis not present

## 2020-12-04 DIAGNOSIS — D649 Anemia, unspecified: Secondary | ICD-10-CM | POA: Diagnosis not present

## 2020-12-04 DIAGNOSIS — Z743 Need for continuous supervision: Secondary | ICD-10-CM | POA: Diagnosis not present

## 2020-12-04 DIAGNOSIS — D638 Anemia in other chronic diseases classified elsewhere: Secondary | ICD-10-CM | POA: Diagnosis not present

## 2020-12-04 DIAGNOSIS — Z7189 Other specified counseling: Secondary | ICD-10-CM | POA: Diagnosis not present

## 2020-12-04 DIAGNOSIS — F4323 Adjustment disorder with mixed anxiety and depressed mood: Secondary | ICD-10-CM | POA: Diagnosis not present

## 2020-12-04 DIAGNOSIS — R4182 Altered mental status, unspecified: Secondary | ICD-10-CM | POA: Diagnosis not present

## 2020-12-04 DIAGNOSIS — R630 Anorexia: Secondary | ICD-10-CM | POA: Diagnosis not present

## 2020-12-04 DIAGNOSIS — R531 Weakness: Secondary | ICD-10-CM | POA: Diagnosis not present

## 2020-12-04 DIAGNOSIS — R627 Adult failure to thrive: Secondary | ICD-10-CM | POA: Diagnosis not present

## 2020-12-04 DIAGNOSIS — I10 Essential (primary) hypertension: Secondary | ICD-10-CM | POA: Diagnosis not present

## 2020-12-04 DIAGNOSIS — K279 Peptic ulcer, site unspecified, unspecified as acute or chronic, without hemorrhage or perforation: Secondary | ICD-10-CM | POA: Diagnosis not present

## 2020-12-04 DIAGNOSIS — R262 Difficulty in walking, not elsewhere classified: Secondary | ICD-10-CM | POA: Diagnosis not present

## 2020-12-04 DIAGNOSIS — Z90710 Acquired absence of both cervix and uterus: Secondary | ICD-10-CM | POA: Diagnosis not present

## 2020-12-04 DIAGNOSIS — R1110 Vomiting, unspecified: Secondary | ICD-10-CM | POA: Diagnosis not present

## 2020-12-04 DIAGNOSIS — M86679 Other chronic osteomyelitis, unspecified ankle and foot: Secondary | ICD-10-CM | POA: Diagnosis not present

## 2020-12-04 DIAGNOSIS — L8990 Pressure ulcer of unspecified site, unspecified stage: Secondary | ICD-10-CM | POA: Diagnosis not present

## 2020-12-04 DIAGNOSIS — R269 Unspecified abnormalities of gait and mobility: Secondary | ICD-10-CM | POA: Diagnosis not present

## 2020-12-04 DIAGNOSIS — E162 Hypoglycemia, unspecified: Secondary | ICD-10-CM | POA: Diagnosis not present

## 2020-12-04 DIAGNOSIS — E8809 Other disorders of plasma-protein metabolism, not elsewhere classified: Secondary | ICD-10-CM | POA: Diagnosis not present

## 2020-12-04 DIAGNOSIS — M4800 Spinal stenosis, site unspecified: Secondary | ICD-10-CM | POA: Diagnosis not present

## 2020-12-04 DIAGNOSIS — M86171 Other acute osteomyelitis, right ankle and foot: Secondary | ICD-10-CM | POA: Diagnosis not present

## 2020-12-04 DIAGNOSIS — L89623 Pressure ulcer of left heel, stage 3: Secondary | ICD-10-CM | POA: Diagnosis not present

## 2020-12-04 DIAGNOSIS — L89312 Pressure ulcer of right buttock, stage 2: Secondary | ICD-10-CM | POA: Diagnosis not present

## 2020-12-04 DIAGNOSIS — F32A Depression, unspecified: Secondary | ICD-10-CM | POA: Diagnosis not present

## 2020-12-04 DIAGNOSIS — E876 Hypokalemia: Secondary | ICD-10-CM | POA: Diagnosis not present

## 2020-12-04 DIAGNOSIS — R5381 Other malaise: Secondary | ICD-10-CM | POA: Diagnosis not present

## 2020-12-04 DIAGNOSIS — B965 Pseudomonas (aeruginosa) (mallei) (pseudomallei) as the cause of diseases classified elsewhere: Secondary | ICD-10-CM | POA: Diagnosis not present

## 2020-12-04 DIAGNOSIS — L89322 Pressure ulcer of left buttock, stage 2: Secondary | ICD-10-CM | POA: Diagnosis not present

## 2020-12-05 DIAGNOSIS — R630 Anorexia: Secondary | ICD-10-CM | POA: Diagnosis not present

## 2020-12-05 DIAGNOSIS — R5381 Other malaise: Secondary | ICD-10-CM | POA: Diagnosis not present

## 2020-12-05 DIAGNOSIS — M86171 Other acute osteomyelitis, right ankle and foot: Secondary | ICD-10-CM | POA: Diagnosis not present

## 2020-12-06 DIAGNOSIS — R269 Unspecified abnormalities of gait and mobility: Secondary | ICD-10-CM | POA: Diagnosis not present

## 2020-12-06 DIAGNOSIS — M86171 Other acute osteomyelitis, right ankle and foot: Secondary | ICD-10-CM | POA: Diagnosis not present

## 2020-12-06 DIAGNOSIS — F338 Other recurrent depressive disorders: Secondary | ICD-10-CM | POA: Diagnosis not present

## 2020-12-06 DIAGNOSIS — R630 Anorexia: Secondary | ICD-10-CM | POA: Diagnosis not present

## 2020-12-06 DIAGNOSIS — M069 Rheumatoid arthritis, unspecified: Secondary | ICD-10-CM | POA: Diagnosis not present

## 2020-12-06 DIAGNOSIS — R5381 Other malaise: Secondary | ICD-10-CM | POA: Diagnosis not present

## 2020-12-06 DIAGNOSIS — M797 Fibromyalgia: Secondary | ICD-10-CM | POA: Diagnosis not present

## 2020-12-07 DIAGNOSIS — F338 Other recurrent depressive disorders: Secondary | ICD-10-CM | POA: Diagnosis not present

## 2020-12-07 DIAGNOSIS — R109 Unspecified abdominal pain: Secondary | ICD-10-CM | POA: Diagnosis not present

## 2020-12-07 DIAGNOSIS — R5381 Other malaise: Secondary | ICD-10-CM | POA: Diagnosis not present

## 2020-12-07 DIAGNOSIS — R630 Anorexia: Secondary | ICD-10-CM | POA: Diagnosis not present

## 2020-12-07 DIAGNOSIS — F32A Depression, unspecified: Secondary | ICD-10-CM | POA: Diagnosis not present

## 2020-12-10 DIAGNOSIS — F4322 Adjustment disorder with anxiety: Secondary | ICD-10-CM | POA: Diagnosis not present

## 2020-12-10 DIAGNOSIS — F4323 Adjustment disorder with mixed anxiety and depressed mood: Secondary | ICD-10-CM | POA: Diagnosis not present

## 2020-12-10 DIAGNOSIS — F331 Major depressive disorder, recurrent, moderate: Secondary | ICD-10-CM | POA: Diagnosis not present

## 2020-12-11 DIAGNOSIS — Z7189 Other specified counseling: Secondary | ICD-10-CM | POA: Diagnosis not present

## 2020-12-11 DIAGNOSIS — R5381 Other malaise: Secondary | ICD-10-CM | POA: Diagnosis not present

## 2020-12-11 DIAGNOSIS — R630 Anorexia: Secondary | ICD-10-CM | POA: Diagnosis not present

## 2020-12-11 DIAGNOSIS — E876 Hypokalemia: Secondary | ICD-10-CM | POA: Diagnosis not present

## 2020-12-12 DIAGNOSIS — I10 Essential (primary) hypertension: Secondary | ICD-10-CM | POA: Diagnosis not present

## 2020-12-13 DIAGNOSIS — E876 Hypokalemia: Secondary | ICD-10-CM | POA: Diagnosis not present

## 2020-12-13 DIAGNOSIS — L89613 Pressure ulcer of right heel, stage 3: Secondary | ICD-10-CM | POA: Diagnosis not present

## 2020-12-13 DIAGNOSIS — L89322 Pressure ulcer of left buttock, stage 2: Secondary | ICD-10-CM | POA: Diagnosis not present

## 2020-12-13 DIAGNOSIS — M86171 Other acute osteomyelitis, right ankle and foot: Secondary | ICD-10-CM | POA: Diagnosis not present

## 2020-12-13 DIAGNOSIS — L89623 Pressure ulcer of left heel, stage 3: Secondary | ICD-10-CM | POA: Diagnosis not present

## 2020-12-13 DIAGNOSIS — L89312 Pressure ulcer of right buttock, stage 2: Secondary | ICD-10-CM | POA: Diagnosis not present

## 2020-12-18 DIAGNOSIS — Z90710 Acquired absence of both cervix and uterus: Secondary | ICD-10-CM | POA: Diagnosis not present

## 2020-12-18 DIAGNOSIS — R630 Anorexia: Secondary | ICD-10-CM | POA: Diagnosis not present

## 2020-12-18 DIAGNOSIS — R627 Adult failure to thrive: Secondary | ICD-10-CM | POA: Diagnosis not present

## 2020-12-18 DIAGNOSIS — E8809 Other disorders of plasma-protein metabolism, not elsewhere classified: Secondary | ICD-10-CM | POA: Diagnosis not present

## 2020-12-18 DIAGNOSIS — R634 Abnormal weight loss: Secondary | ICD-10-CM | POA: Diagnosis not present

## 2020-12-18 DIAGNOSIS — G319 Degenerative disease of nervous system, unspecified: Secondary | ICD-10-CM | POA: Diagnosis not present

## 2020-12-18 DIAGNOSIS — R1110 Vomiting, unspecified: Secondary | ICD-10-CM | POA: Diagnosis not present

## 2020-12-18 DIAGNOSIS — D638 Anemia in other chronic diseases classified elsewhere: Secondary | ICD-10-CM | POA: Diagnosis not present

## 2020-12-18 DIAGNOSIS — M86679 Other chronic osteomyelitis, unspecified ankle and foot: Secondary | ICD-10-CM | POA: Diagnosis not present

## 2020-12-18 DIAGNOSIS — R410 Disorientation, unspecified: Secondary | ICD-10-CM | POA: Diagnosis not present

## 2020-12-18 DIAGNOSIS — R4182 Altered mental status, unspecified: Secondary | ICD-10-CM | POA: Diagnosis not present

## 2020-12-19 DIAGNOSIS — M5442 Lumbago with sciatica, left side: Secondary | ICD-10-CM | POA: Diagnosis not present

## 2020-12-19 DIAGNOSIS — R4182 Altered mental status, unspecified: Secondary | ICD-10-CM | POA: Diagnosis not present

## 2020-12-19 DIAGNOSIS — E872 Acidosis: Secondary | ICD-10-CM | POA: Diagnosis not present

## 2020-12-19 DIAGNOSIS — E8809 Other disorders of plasma-protein metabolism, not elsewhere classified: Secondary | ICD-10-CM | POA: Diagnosis not present

## 2020-12-19 DIAGNOSIS — R29898 Other symptoms and signs involving the musculoskeletal system: Secondary | ICD-10-CM | POA: Diagnosis not present

## 2020-12-19 DIAGNOSIS — K625 Hemorrhage of anus and rectum: Secondary | ICD-10-CM | POA: Diagnosis not present

## 2020-12-19 DIAGNOSIS — Z515 Encounter for palliative care: Secondary | ICD-10-CM | POA: Diagnosis not present

## 2020-12-19 DIAGNOSIS — M869 Osteomyelitis, unspecified: Secondary | ICD-10-CM | POA: Diagnosis not present

## 2020-12-19 DIAGNOSIS — G63 Polyneuropathy in diseases classified elsewhere: Secondary | ICD-10-CM | POA: Diagnosis not present

## 2020-12-20 ENCOUNTER — Other Ambulatory Visit: Payer: Self-pay | Admitting: *Deleted

## 2020-12-20 DIAGNOSIS — Z7901 Long term (current) use of anticoagulants: Secondary | ICD-10-CM | POA: Diagnosis not present

## 2020-12-20 DIAGNOSIS — J810 Acute pulmonary edema: Secondary | ICD-10-CM | POA: Diagnosis not present

## 2020-12-20 DIAGNOSIS — R410 Disorientation, unspecified: Secondary | ICD-10-CM | POA: Diagnosis not present

## 2020-12-20 DIAGNOSIS — E164 Increased secretion of gastrin: Secondary | ICD-10-CM | POA: Diagnosis not present

## 2020-12-20 DIAGNOSIS — E872 Acidosis: Secondary | ICD-10-CM | POA: Diagnosis not present

## 2020-12-20 DIAGNOSIS — E870 Hyperosmolality and hypernatremia: Secondary | ICD-10-CM | POA: Diagnosis not present

## 2020-12-20 DIAGNOSIS — M797 Fibromyalgia: Secondary | ICD-10-CM

## 2020-12-20 DIAGNOSIS — K5792 Diverticulitis of intestine, part unspecified, without perforation or abscess without bleeding: Secondary | ICD-10-CM | POA: Diagnosis not present

## 2020-12-20 DIAGNOSIS — I82409 Acute embolism and thrombosis of unspecified deep veins of unspecified lower extremity: Secondary | ICD-10-CM | POA: Diagnosis not present

## 2020-12-20 DIAGNOSIS — E875 Hyperkalemia: Secondary | ICD-10-CM | POA: Diagnosis not present

## 2020-12-20 DIAGNOSIS — K254 Chronic or unspecified gastric ulcer with hemorrhage: Secondary | ICD-10-CM | POA: Diagnosis not present

## 2020-12-20 DIAGNOSIS — R634 Abnormal weight loss: Secondary | ICD-10-CM | POA: Diagnosis not present

## 2020-12-20 DIAGNOSIS — I5030 Unspecified diastolic (congestive) heart failure: Secondary | ICD-10-CM | POA: Diagnosis not present

## 2020-12-20 DIAGNOSIS — R4182 Altered mental status, unspecified: Secondary | ICD-10-CM | POA: Diagnosis not present

## 2020-12-20 DIAGNOSIS — R1310 Dysphagia, unspecified: Secondary | ICD-10-CM | POA: Diagnosis not present

## 2020-12-20 DIAGNOSIS — K648 Other hemorrhoids: Secondary | ICD-10-CM | POA: Diagnosis not present

## 2020-12-20 DIAGNOSIS — K922 Gastrointestinal hemorrhage, unspecified: Secondary | ICD-10-CM | POA: Diagnosis not present

## 2020-12-20 DIAGNOSIS — M069 Rheumatoid arthritis, unspecified: Secondary | ICD-10-CM | POA: Diagnosis not present

## 2020-12-20 DIAGNOSIS — I82621 Acute embolism and thrombosis of deep veins of right upper extremity: Secondary | ICD-10-CM | POA: Diagnosis not present

## 2020-12-20 DIAGNOSIS — R627 Adult failure to thrive: Secondary | ICD-10-CM | POA: Diagnosis not present

## 2020-12-20 DIAGNOSIS — L89613 Pressure ulcer of right heel, stage 3: Secondary | ICD-10-CM | POA: Diagnosis not present

## 2020-12-20 DIAGNOSIS — I110 Hypertensive heart disease with heart failure: Secondary | ICD-10-CM | POA: Diagnosis not present

## 2020-12-20 DIAGNOSIS — Z792 Long term (current) use of antibiotics: Secondary | ICD-10-CM | POA: Diagnosis not present

## 2020-12-20 DIAGNOSIS — M86171 Other acute osteomyelitis, right ankle and foot: Secondary | ICD-10-CM | POA: Diagnosis not present

## 2020-12-20 DIAGNOSIS — E8809 Other disorders of plasma-protein metabolism, not elsewhere classified: Secondary | ICD-10-CM | POA: Diagnosis not present

## 2020-12-20 DIAGNOSIS — G8929 Other chronic pain: Secondary | ICD-10-CM

## 2020-12-20 DIAGNOSIS — E46 Unspecified protein-calorie malnutrition: Secondary | ICD-10-CM | POA: Diagnosis not present

## 2020-12-20 DIAGNOSIS — I2699 Other pulmonary embolism without acute cor pulmonale: Secondary | ICD-10-CM | POA: Diagnosis not present

## 2020-12-20 DIAGNOSIS — R195 Other fecal abnormalities: Secondary | ICD-10-CM | POA: Diagnosis not present

## 2020-12-20 DIAGNOSIS — N179 Acute kidney failure, unspecified: Secondary | ICD-10-CM | POA: Diagnosis not present

## 2020-12-20 DIAGNOSIS — D638 Anemia in other chronic diseases classified elsewhere: Secondary | ICD-10-CM | POA: Diagnosis not present

## 2020-12-20 DIAGNOSIS — G9341 Metabolic encephalopathy: Secondary | ICD-10-CM | POA: Diagnosis not present

## 2020-12-20 DIAGNOSIS — R0689 Other abnormalities of breathing: Secondary | ICD-10-CM | POA: Diagnosis not present

## 2020-12-20 DIAGNOSIS — D649 Anemia, unspecified: Secondary | ICD-10-CM | POA: Diagnosis not present

## 2020-12-20 DIAGNOSIS — G894 Chronic pain syndrome: Secondary | ICD-10-CM

## 2020-12-20 DIAGNOSIS — I5042 Chronic combined systolic (congestive) and diastolic (congestive) heart failure: Secondary | ICD-10-CM | POA: Diagnosis not present

## 2020-12-20 DIAGNOSIS — Z515 Encounter for palliative care: Secondary | ICD-10-CM | POA: Diagnosis not present

## 2020-12-20 DIAGNOSIS — M4800 Spinal stenosis, site unspecified: Secondary | ICD-10-CM | POA: Diagnosis not present

## 2020-12-20 DIAGNOSIS — M869 Osteomyelitis, unspecified: Secondary | ICD-10-CM | POA: Diagnosis not present

## 2020-12-20 DIAGNOSIS — J189 Pneumonia, unspecified organism: Secondary | ICD-10-CM | POA: Diagnosis not present

## 2020-12-20 DIAGNOSIS — R5381 Other malaise: Secondary | ICD-10-CM | POA: Diagnosis not present

## 2020-12-20 DIAGNOSIS — Z743 Need for continuous supervision: Secondary | ICD-10-CM | POA: Diagnosis not present

## 2020-12-20 DIAGNOSIS — I509 Heart failure, unspecified: Secondary | ICD-10-CM | POA: Diagnosis not present

## 2020-12-20 DIAGNOSIS — M86679 Other chronic osteomyelitis, unspecified ankle and foot: Secondary | ICD-10-CM | POA: Diagnosis not present

## 2020-12-20 DIAGNOSIS — J90 Pleural effusion, not elsewhere classified: Secondary | ICD-10-CM | POA: Diagnosis not present

## 2020-12-20 DIAGNOSIS — R1312 Dysphagia, oropharyngeal phase: Secondary | ICD-10-CM | POA: Diagnosis not present

## 2020-12-20 DIAGNOSIS — L97514 Non-pressure chronic ulcer of other part of right foot with necrosis of bone: Secondary | ICD-10-CM | POA: Diagnosis not present

## 2020-12-20 DIAGNOSIS — K209 Esophagitis, unspecified without bleeding: Secondary | ICD-10-CM | POA: Diagnosis not present

## 2020-12-20 DIAGNOSIS — I10 Essential (primary) hypertension: Secondary | ICD-10-CM | POA: Diagnosis not present

## 2020-12-20 DIAGNOSIS — K625 Hemorrhage of anus and rectum: Secondary | ICD-10-CM | POA: Diagnosis not present

## 2020-12-20 DIAGNOSIS — E43 Unspecified severe protein-calorie malnutrition: Secondary | ICD-10-CM | POA: Diagnosis not present

## 2020-12-20 DIAGNOSIS — M5442 Lumbago with sciatica, left side: Secondary | ICD-10-CM

## 2020-12-20 MED ORDER — OXYCODONE HCL 15 MG PO TABS: 15.0000 mg | ORAL_TABLET | Freq: Three times a day (TID) | ORAL | 0 refills | Status: DC | PRN

## 2020-12-20 NOTE — Telephone Encounter (Signed)
Need to make appointment prior to next visit prior to refill.

## 2020-12-20 NOTE — Telephone Encounter (Signed)
Patient son, Melinda [name], called requesting refill.  Needs an updated contract, no upcoming appointment.  Epic LR: 11/16/2020 Pended Rx and sent to [facility] for approval.

## 2020-12-21 DIAGNOSIS — M869 Osteomyelitis, unspecified: Secondary | ICD-10-CM | POA: Diagnosis not present

## 2020-12-21 DIAGNOSIS — R4182 Altered mental status, unspecified: Secondary | ICD-10-CM | POA: Diagnosis not present

## 2020-12-21 DIAGNOSIS — G9341 Metabolic encephalopathy: Secondary | ICD-10-CM | POA: Diagnosis not present

## 2020-12-21 DIAGNOSIS — I509 Heart failure, unspecified: Secondary | ICD-10-CM | POA: Diagnosis not present

## 2020-12-21 DIAGNOSIS — E8809 Other disorders of plasma-protein metabolism, not elsewhere classified: Secondary | ICD-10-CM | POA: Diagnosis not present

## 2020-12-21 DIAGNOSIS — I10 Essential (primary) hypertension: Secondary | ICD-10-CM | POA: Diagnosis not present

## 2020-12-21 DIAGNOSIS — R627 Adult failure to thrive: Secondary | ICD-10-CM | POA: Diagnosis not present

## 2020-12-21 NOTE — Telephone Encounter (Signed)
Patient son Notified and agreed.

## 2020-12-22 DIAGNOSIS — R634 Abnormal weight loss: Secondary | ICD-10-CM | POA: Diagnosis not present

## 2020-12-22 DIAGNOSIS — G9341 Metabolic encephalopathy: Secondary | ICD-10-CM | POA: Diagnosis not present

## 2020-12-22 DIAGNOSIS — R4182 Altered mental status, unspecified: Secondary | ICD-10-CM | POA: Diagnosis not present

## 2020-12-22 DIAGNOSIS — N179 Acute kidney failure, unspecified: Secondary | ICD-10-CM | POA: Diagnosis not present

## 2020-12-22 DIAGNOSIS — E8809 Other disorders of plasma-protein metabolism, not elsewhere classified: Secondary | ICD-10-CM | POA: Diagnosis not present

## 2020-12-22 DIAGNOSIS — M869 Osteomyelitis, unspecified: Secondary | ICD-10-CM | POA: Diagnosis not present

## 2020-12-23 DIAGNOSIS — M869 Osteomyelitis, unspecified: Secondary | ICD-10-CM | POA: Diagnosis not present

## 2020-12-23 DIAGNOSIS — G9341 Metabolic encephalopathy: Secondary | ICD-10-CM | POA: Diagnosis not present

## 2020-12-23 DIAGNOSIS — R410 Disorientation, unspecified: Secondary | ICD-10-CM | POA: Diagnosis not present

## 2020-12-23 DIAGNOSIS — M86679 Other chronic osteomyelitis, unspecified ankle and foot: Secondary | ICD-10-CM | POA: Diagnosis not present

## 2020-12-23 DIAGNOSIS — Z792 Long term (current) use of antibiotics: Secondary | ICD-10-CM | POA: Diagnosis not present

## 2020-12-23 DIAGNOSIS — R634 Abnormal weight loss: Secondary | ICD-10-CM | POA: Diagnosis not present

## 2020-12-23 DIAGNOSIS — E8809 Other disorders of plasma-protein metabolism, not elsewhere classified: Secondary | ICD-10-CM | POA: Diagnosis not present

## 2020-12-24 DIAGNOSIS — G9341 Metabolic encephalopathy: Secondary | ICD-10-CM | POA: Diagnosis not present

## 2020-12-24 DIAGNOSIS — M869 Osteomyelitis, unspecified: Secondary | ICD-10-CM | POA: Diagnosis not present

## 2020-12-24 DIAGNOSIS — E8809 Other disorders of plasma-protein metabolism, not elsewhere classified: Secondary | ICD-10-CM | POA: Diagnosis not present

## 2020-12-25 DIAGNOSIS — E8809 Other disorders of plasma-protein metabolism, not elsewhere classified: Secondary | ICD-10-CM | POA: Diagnosis not present

## 2020-12-25 DIAGNOSIS — G9341 Metabolic encephalopathy: Secondary | ICD-10-CM | POA: Diagnosis not present

## 2020-12-25 DIAGNOSIS — M869 Osteomyelitis, unspecified: Secondary | ICD-10-CM | POA: Diagnosis not present

## 2020-12-26 DIAGNOSIS — E8809 Other disorders of plasma-protein metabolism, not elsewhere classified: Secondary | ICD-10-CM | POA: Diagnosis not present

## 2020-12-26 DIAGNOSIS — M869 Osteomyelitis, unspecified: Secondary | ICD-10-CM | POA: Diagnosis not present

## 2020-12-26 DIAGNOSIS — G9341 Metabolic encephalopathy: Secondary | ICD-10-CM | POA: Diagnosis not present

## 2020-12-27 DIAGNOSIS — E8809 Other disorders of plasma-protein metabolism, not elsewhere classified: Secondary | ICD-10-CM | POA: Diagnosis not present

## 2020-12-27 DIAGNOSIS — G9341 Metabolic encephalopathy: Secondary | ICD-10-CM | POA: Diagnosis not present

## 2020-12-27 DIAGNOSIS — M869 Osteomyelitis, unspecified: Secondary | ICD-10-CM | POA: Diagnosis not present

## 2021-01-07 DIAGNOSIS — I82409 Acute embolism and thrombosis of unspecified deep veins of unspecified lower extremity: Secondary | ICD-10-CM | POA: Diagnosis not present

## 2021-01-07 DIAGNOSIS — D649 Anemia, unspecified: Secondary | ICD-10-CM | POA: Diagnosis not present

## 2021-01-07 DIAGNOSIS — J810 Acute pulmonary edema: Secondary | ICD-10-CM | POA: Diagnosis not present

## 2021-01-07 DIAGNOSIS — R5381 Other malaise: Secondary | ICD-10-CM | POA: Diagnosis not present

## 2021-01-07 DIAGNOSIS — R0689 Other abnormalities of breathing: Secondary | ICD-10-CM | POA: Diagnosis not present

## 2021-01-07 DIAGNOSIS — J189 Pneumonia, unspecified organism: Secondary | ICD-10-CM | POA: Diagnosis not present

## 2021-01-08 DIAGNOSIS — J810 Acute pulmonary edema: Secondary | ICD-10-CM | POA: Diagnosis not present

## 2021-01-08 DIAGNOSIS — J189 Pneumonia, unspecified organism: Secondary | ICD-10-CM | POA: Diagnosis not present

## 2021-01-08 DIAGNOSIS — I82409 Acute embolism and thrombosis of unspecified deep veins of unspecified lower extremity: Secondary | ICD-10-CM | POA: Diagnosis not present

## 2021-01-08 DIAGNOSIS — R0689 Other abnormalities of breathing: Secondary | ICD-10-CM | POA: Diagnosis not present

## 2021-01-08 DIAGNOSIS — R5381 Other malaise: Secondary | ICD-10-CM | POA: Diagnosis not present

## 2021-01-08 DIAGNOSIS — D649 Anemia, unspecified: Secondary | ICD-10-CM | POA: Diagnosis not present

## 2021-01-14 DIAGNOSIS — W1830XA Fall on same level, unspecified, initial encounter: Secondary | ICD-10-CM | POA: Diagnosis not present

## 2021-01-14 DIAGNOSIS — E46 Unspecified protein-calorie malnutrition: Secondary | ICD-10-CM | POA: Diagnosis not present

## 2021-01-14 DIAGNOSIS — M79605 Pain in left leg: Secondary | ICD-10-CM | POA: Diagnosis not present

## 2021-01-14 DIAGNOSIS — I10 Essential (primary) hypertension: Secondary | ICD-10-CM | POA: Diagnosis not present

## 2021-01-14 DIAGNOSIS — J810 Acute pulmonary edema: Secondary | ICD-10-CM | POA: Diagnosis not present

## 2021-01-14 DIAGNOSIS — M4800 Spinal stenosis, site unspecified: Secondary | ICD-10-CM | POA: Diagnosis not present

## 2021-01-14 DIAGNOSIS — R799 Abnormal finding of blood chemistry, unspecified: Secondary | ICD-10-CM | POA: Diagnosis not present

## 2021-01-14 DIAGNOSIS — Z79899 Other long term (current) drug therapy: Secondary | ICD-10-CM | POA: Diagnosis not present

## 2021-01-14 DIAGNOSIS — R29898 Other symptoms and signs involving the musculoskeletal system: Secondary | ICD-10-CM | POA: Diagnosis not present

## 2021-01-14 DIAGNOSIS — R489 Unspecified symbolic dysfunctions: Secondary | ICD-10-CM | POA: Diagnosis not present

## 2021-01-14 DIAGNOSIS — I5030 Unspecified diastolic (congestive) heart failure: Secondary | ICD-10-CM | POA: Diagnosis not present

## 2021-01-14 DIAGNOSIS — M7989 Other specified soft tissue disorders: Secondary | ICD-10-CM | POA: Diagnosis not present

## 2021-01-14 DIAGNOSIS — M069 Rheumatoid arthritis, unspecified: Secondary | ICD-10-CM | POA: Diagnosis not present

## 2021-01-14 DIAGNOSIS — M5441 Lumbago with sciatica, right side: Secondary | ICD-10-CM | POA: Diagnosis not present

## 2021-01-14 DIAGNOSIS — M6281 Muscle weakness (generalized): Secondary | ICD-10-CM | POA: Diagnosis not present

## 2021-01-14 DIAGNOSIS — M5442 Lumbago with sciatica, left side: Secondary | ICD-10-CM | POA: Diagnosis not present

## 2021-01-14 DIAGNOSIS — J189 Pneumonia, unspecified organism: Secondary | ICD-10-CM | POA: Diagnosis not present

## 2021-01-14 DIAGNOSIS — S0003XA Contusion of scalp, initial encounter: Secondary | ICD-10-CM | POA: Diagnosis not present

## 2021-01-14 DIAGNOSIS — G63 Polyneuropathy in diseases classified elsewhere: Secondary | ICD-10-CM | POA: Diagnosis not present

## 2021-01-14 DIAGNOSIS — R296 Repeated falls: Secondary | ICD-10-CM | POA: Diagnosis not present

## 2021-01-14 DIAGNOSIS — I82409 Acute embolism and thrombosis of unspecified deep veins of unspecified lower extremity: Secondary | ICD-10-CM | POA: Diagnosis not present

## 2021-01-14 DIAGNOSIS — G8911 Acute pain due to trauma: Secondary | ICD-10-CM | POA: Diagnosis not present

## 2021-01-14 DIAGNOSIS — G9341 Metabolic encephalopathy: Secondary | ICD-10-CM | POA: Diagnosis not present

## 2021-01-14 DIAGNOSIS — D649 Anemia, unspecified: Secondary | ICD-10-CM | POA: Diagnosis not present

## 2021-01-14 DIAGNOSIS — Z043 Encounter for examination and observation following other accident: Secondary | ICD-10-CM | POA: Diagnosis not present

## 2021-01-14 DIAGNOSIS — R627 Adult failure to thrive: Secondary | ICD-10-CM | POA: Diagnosis not present

## 2021-01-14 DIAGNOSIS — S0990XA Unspecified injury of head, initial encounter: Secondary | ICD-10-CM | POA: Diagnosis not present

## 2021-01-14 DIAGNOSIS — U071 COVID-19: Secondary | ICD-10-CM | POA: Diagnosis not present

## 2021-01-14 DIAGNOSIS — G894 Chronic pain syndrome: Secondary | ICD-10-CM | POA: Diagnosis not present

## 2021-01-14 DIAGNOSIS — M7731 Calcaneal spur, right foot: Secondary | ICD-10-CM | POA: Diagnosis not present

## 2021-01-14 DIAGNOSIS — G8929 Other chronic pain: Secondary | ICD-10-CM | POA: Diagnosis not present

## 2021-01-14 DIAGNOSIS — R262 Difficulty in walking, not elsewhere classified: Secondary | ICD-10-CM | POA: Diagnosis not present

## 2021-01-14 DIAGNOSIS — R5381 Other malaise: Secondary | ICD-10-CM | POA: Diagnosis not present

## 2021-01-14 DIAGNOSIS — Z7401 Bed confinement status: Secondary | ICD-10-CM | POA: Diagnosis not present

## 2021-01-14 DIAGNOSIS — M542 Cervicalgia: Secondary | ICD-10-CM | POA: Diagnosis not present

## 2021-01-14 DIAGNOSIS — R52 Pain, unspecified: Secondary | ICD-10-CM | POA: Diagnosis not present

## 2021-01-14 DIAGNOSIS — R7989 Other specified abnormal findings of blood chemistry: Secondary | ICD-10-CM | POA: Diagnosis not present

## 2021-01-14 DIAGNOSIS — M1990 Unspecified osteoarthritis, unspecified site: Secondary | ICD-10-CM | POA: Diagnosis not present

## 2021-01-14 DIAGNOSIS — M2550 Pain in unspecified joint: Secondary | ICD-10-CM | POA: Diagnosis not present

## 2021-01-14 DIAGNOSIS — R1312 Dysphagia, oropharyngeal phase: Secondary | ICD-10-CM | POA: Diagnosis not present

## 2021-01-14 DIAGNOSIS — Z7901 Long term (current) use of anticoagulants: Secondary | ICD-10-CM | POA: Diagnosis not present

## 2021-01-14 DIAGNOSIS — R0689 Other abnormalities of breathing: Secondary | ICD-10-CM | POA: Diagnosis not present

## 2021-01-14 DIAGNOSIS — I4891 Unspecified atrial fibrillation: Secondary | ICD-10-CM | POA: Diagnosis not present

## 2021-01-14 DIAGNOSIS — F32A Depression, unspecified: Secondary | ICD-10-CM | POA: Diagnosis not present

## 2021-01-14 DIAGNOSIS — I82621 Acute embolism and thrombosis of deep veins of right upper extremity: Secondary | ICD-10-CM | POA: Diagnosis not present

## 2021-01-14 DIAGNOSIS — M797 Fibromyalgia: Secondary | ICD-10-CM | POA: Diagnosis not present

## 2021-01-14 DIAGNOSIS — R4182 Altered mental status, unspecified: Secondary | ICD-10-CM | POA: Diagnosis not present

## 2021-01-14 DIAGNOSIS — Z743 Need for continuous supervision: Secondary | ICD-10-CM | POA: Diagnosis not present

## 2021-01-14 DIAGNOSIS — M7732 Calcaneal spur, left foot: Secondary | ICD-10-CM | POA: Diagnosis not present

## 2021-01-15 DIAGNOSIS — M2550 Pain in unspecified joint: Secondary | ICD-10-CM | POA: Diagnosis not present

## 2021-01-15 DIAGNOSIS — R5381 Other malaise: Secondary | ICD-10-CM | POA: Diagnosis not present

## 2021-01-15 DIAGNOSIS — M6281 Muscle weakness (generalized): Secondary | ICD-10-CM | POA: Diagnosis not present

## 2021-01-15 DIAGNOSIS — M1990 Unspecified osteoarthritis, unspecified site: Secondary | ICD-10-CM | POA: Diagnosis not present

## 2021-01-15 DIAGNOSIS — R4182 Altered mental status, unspecified: Secondary | ICD-10-CM | POA: Diagnosis not present

## 2021-01-15 DIAGNOSIS — I10 Essential (primary) hypertension: Secondary | ICD-10-CM | POA: Diagnosis not present

## 2021-01-15 DIAGNOSIS — E46 Unspecified protein-calorie malnutrition: Secondary | ICD-10-CM | POA: Diagnosis not present

## 2021-01-16 DIAGNOSIS — G9341 Metabolic encephalopathy: Secondary | ICD-10-CM | POA: Diagnosis not present

## 2021-01-16 DIAGNOSIS — I5030 Unspecified diastolic (congestive) heart failure: Secondary | ICD-10-CM | POA: Diagnosis not present

## 2021-01-16 DIAGNOSIS — M797 Fibromyalgia: Secondary | ICD-10-CM | POA: Diagnosis not present

## 2021-01-16 DIAGNOSIS — R627 Adult failure to thrive: Secondary | ICD-10-CM | POA: Diagnosis not present

## 2021-01-16 DIAGNOSIS — D649 Anemia, unspecified: Secondary | ICD-10-CM | POA: Diagnosis not present

## 2021-01-16 DIAGNOSIS — I10 Essential (primary) hypertension: Secondary | ICD-10-CM | POA: Diagnosis not present

## 2021-01-16 DIAGNOSIS — R4182 Altered mental status, unspecified: Secondary | ICD-10-CM | POA: Diagnosis not present

## 2021-01-17 DIAGNOSIS — M797 Fibromyalgia: Secondary | ICD-10-CM | POA: Diagnosis not present

## 2021-01-17 DIAGNOSIS — M6281 Muscle weakness (generalized): Secondary | ICD-10-CM | POA: Diagnosis not present

## 2021-01-17 DIAGNOSIS — D649 Anemia, unspecified: Secondary | ICD-10-CM | POA: Diagnosis not present

## 2021-01-17 DIAGNOSIS — M1990 Unspecified osteoarthritis, unspecified site: Secondary | ICD-10-CM | POA: Diagnosis not present

## 2021-01-17 DIAGNOSIS — I10 Essential (primary) hypertension: Secondary | ICD-10-CM | POA: Diagnosis not present

## 2021-01-17 DIAGNOSIS — G9341 Metabolic encephalopathy: Secondary | ICD-10-CM | POA: Diagnosis not present

## 2021-01-17 DIAGNOSIS — M2550 Pain in unspecified joint: Secondary | ICD-10-CM | POA: Diagnosis not present

## 2021-01-17 DIAGNOSIS — R5381 Other malaise: Secondary | ICD-10-CM | POA: Diagnosis not present

## 2021-01-17 DIAGNOSIS — I5030 Unspecified diastolic (congestive) heart failure: Secondary | ICD-10-CM | POA: Diagnosis not present

## 2021-01-17 DIAGNOSIS — R627 Adult failure to thrive: Secondary | ICD-10-CM | POA: Diagnosis not present

## 2021-01-17 DIAGNOSIS — R4182 Altered mental status, unspecified: Secondary | ICD-10-CM | POA: Diagnosis not present

## 2021-01-18 DIAGNOSIS — R29898 Other symptoms and signs involving the musculoskeletal system: Secondary | ICD-10-CM | POA: Diagnosis not present

## 2021-01-18 DIAGNOSIS — M5442 Lumbago with sciatica, left side: Secondary | ICD-10-CM | POA: Diagnosis not present

## 2021-01-22 DIAGNOSIS — E46 Unspecified protein-calorie malnutrition: Secondary | ICD-10-CM | POA: Diagnosis not present

## 2021-01-22 DIAGNOSIS — R4182 Altered mental status, unspecified: Secondary | ICD-10-CM | POA: Diagnosis not present

## 2021-01-22 DIAGNOSIS — I10 Essential (primary) hypertension: Secondary | ICD-10-CM | POA: Diagnosis not present

## 2021-01-22 DIAGNOSIS — M6281 Muscle weakness (generalized): Secondary | ICD-10-CM | POA: Diagnosis not present

## 2021-01-24 DIAGNOSIS — I5030 Unspecified diastolic (congestive) heart failure: Secondary | ICD-10-CM | POA: Diagnosis not present

## 2021-01-24 DIAGNOSIS — D649 Anemia, unspecified: Secondary | ICD-10-CM | POA: Diagnosis not present

## 2021-01-24 DIAGNOSIS — M6281 Muscle weakness (generalized): Secondary | ICD-10-CM | POA: Diagnosis not present

## 2021-01-24 DIAGNOSIS — R4182 Altered mental status, unspecified: Secondary | ICD-10-CM | POA: Diagnosis not present

## 2021-01-24 DIAGNOSIS — R489 Unspecified symbolic dysfunctions: Secondary | ICD-10-CM | POA: Diagnosis not present

## 2021-01-24 DIAGNOSIS — R5381 Other malaise: Secondary | ICD-10-CM | POA: Diagnosis not present

## 2021-01-24 DIAGNOSIS — G9341 Metabolic encephalopathy: Secondary | ICD-10-CM | POA: Diagnosis not present

## 2021-01-24 DIAGNOSIS — R627 Adult failure to thrive: Secondary | ICD-10-CM | POA: Diagnosis not present

## 2021-01-24 DIAGNOSIS — M797 Fibromyalgia: Secondary | ICD-10-CM | POA: Diagnosis not present

## 2021-01-24 DIAGNOSIS — I10 Essential (primary) hypertension: Secondary | ICD-10-CM | POA: Diagnosis not present

## 2021-01-24 DIAGNOSIS — M1990 Unspecified osteoarthritis, unspecified site: Secondary | ICD-10-CM | POA: Diagnosis not present

## 2021-01-28 DIAGNOSIS — I10 Essential (primary) hypertension: Secondary | ICD-10-CM | POA: Diagnosis not present

## 2021-01-28 DIAGNOSIS — E46 Unspecified protein-calorie malnutrition: Secondary | ICD-10-CM | POA: Diagnosis not present

## 2021-01-28 DIAGNOSIS — M6281 Muscle weakness (generalized): Secondary | ICD-10-CM | POA: Diagnosis not present

## 2021-01-28 DIAGNOSIS — M797 Fibromyalgia: Secondary | ICD-10-CM | POA: Diagnosis not present

## 2021-01-30 DIAGNOSIS — R627 Adult failure to thrive: Secondary | ICD-10-CM | POA: Diagnosis not present

## 2021-01-30 DIAGNOSIS — R4182 Altered mental status, unspecified: Secondary | ICD-10-CM | POA: Diagnosis not present

## 2021-01-30 DIAGNOSIS — D649 Anemia, unspecified: Secondary | ICD-10-CM | POA: Diagnosis not present

## 2021-01-30 DIAGNOSIS — M797 Fibromyalgia: Secondary | ICD-10-CM | POA: Diagnosis not present

## 2021-01-30 DIAGNOSIS — I5030 Unspecified diastolic (congestive) heart failure: Secondary | ICD-10-CM | POA: Diagnosis not present

## 2021-01-30 DIAGNOSIS — I10 Essential (primary) hypertension: Secondary | ICD-10-CM | POA: Diagnosis not present

## 2021-01-30 DIAGNOSIS — G9341 Metabolic encephalopathy: Secondary | ICD-10-CM | POA: Diagnosis not present

## 2021-01-31 ENCOUNTER — Other Ambulatory Visit: Payer: Self-pay

## 2021-01-31 ENCOUNTER — Telehealth: Payer: Self-pay

## 2021-01-31 ENCOUNTER — Encounter: Payer: Self-pay | Admitting: Family

## 2021-01-31 ENCOUNTER — Telehealth (INDEPENDENT_AMBULATORY_CARE_PROVIDER_SITE_OTHER): Payer: Medicare HMO | Admitting: Family

## 2021-01-31 DIAGNOSIS — E46 Unspecified protein-calorie malnutrition: Secondary | ICD-10-CM | POA: Diagnosis not present

## 2021-01-31 DIAGNOSIS — M5441 Lumbago with sciatica, right side: Secondary | ICD-10-CM

## 2021-01-31 DIAGNOSIS — M6281 Muscle weakness (generalized): Secondary | ICD-10-CM | POA: Diagnosis not present

## 2021-01-31 DIAGNOSIS — M5442 Lumbago with sciatica, left side: Secondary | ICD-10-CM | POA: Diagnosis not present

## 2021-01-31 DIAGNOSIS — G8929 Other chronic pain: Secondary | ICD-10-CM

## 2021-01-31 DIAGNOSIS — G894 Chronic pain syndrome: Secondary | ICD-10-CM | POA: Diagnosis not present

## 2021-01-31 DIAGNOSIS — I10 Essential (primary) hypertension: Secondary | ICD-10-CM | POA: Diagnosis not present

## 2021-01-31 DIAGNOSIS — M797 Fibromyalgia: Secondary | ICD-10-CM

## 2021-01-31 DIAGNOSIS — R262 Difficulty in walking, not elsewhere classified: Secondary | ICD-10-CM | POA: Diagnosis not present

## 2021-01-31 DIAGNOSIS — M1990 Unspecified osteoarthritis, unspecified site: Secondary | ICD-10-CM | POA: Diagnosis not present

## 2021-01-31 DIAGNOSIS — R489 Unspecified symbolic dysfunctions: Secondary | ICD-10-CM | POA: Diagnosis not present

## 2021-01-31 NOTE — Progress Notes (Signed)
  This service is provided via telemedicine  No vital signs collected/recorded due to the encounter was a telemedicine visit.   Location of patient (ex: home, work):  Home.  Patient consents to a telephone visit:  Yes  Location of the provider (ex: office, home):  [facility] Office.  Name of any referring provider:  [name], [name] C, NP   Names of all persons participating in the telemedicine service and their role in the encounter:  Patient, Melinda [name], RMA, [name], [name], NP.    Time spent on call:  8 minutes spent on the phone with Medical Assistant.    

## 2021-01-31 NOTE — Telephone Encounter (Signed)
Ms. kalise, fickett are scheduled for a virtual visit with your provider today.    Just as we do with appointments in the office, we must obtain your consent to participate.  Your consent will be active for this visit and any virtual visit you may have with one of our providers in the next 365 days.    If you have a MyChart account, I can also send a copy of this consent to you electronically.  All virtual visits are billed to your insurance company just like a traditional visit in the office.  As this is a virtual visit, video technology does not allow for your provider to perform a traditional examination.  This may limit your provider's ability to fully assess your condition.  If your provider identifies any concerns that need to be evaluated in person or the need to arrange testing such as labs, EKG, etc, we will make arrangements to do so.    Although advances in technology are sophisticated, we cannot ensure that it will always work on either your end or our end.  If the connection with a video visit is poor, we may have to switch to a telephone visit.  With either a video or telephone visit, we are not always able to ensure that we have a secure connection.   I need to obtain your verbal consent now.   Are you willing to proceed with your visit today?   Wahneta Derocher has provided verbal consent on 01/31/2021 for a virtual visit (video or telephone).   Dicky Doe, [place] 01/31/2021  3:46 PM

## 2021-01-31 NOTE — Patient Instructions (Signed)
Please schedule in office visit .

## 2021-01-31 NOTE — Progress Notes (Signed)
Provider: Richarda Blade FNP-C  Melinda [name], Melinda Citrin, NP  Patient Care Team: Melinda [name], Melinda Citrin, NP as PCP - General (Family Medicine)  Extended Emergency Contact Information Primary Emergency Contact: Melinda [name] [place] Mobile Phone: [phone] Relation: Son Secondary Emergency Contact: Melinda [name] [place] Mobile Phone: [phone] Relation: Daughter  Code Status: Full Code  Goals of care: Advanced Directive information Advanced Directives 01/31/2021  Does Patient Have a Medical Advance Directive? No  Type of Advance Directive -  Does patient want to make changes to medical advance directive? -  Copy of Healthcare Power of Attorney in Chart? -  Would patient like information on creating a medical advance directive? No - Patient declined     Chief Complaint  Patient presents with   Medical Management of Chronic Issues    Patient wants to speak about medications.    HPI:  Pt is a 70 y.o. female seen today for an acute visit to discuss pain medication.she states would like provider to refill her pain medication for just one time.she was advised previous months to come to office for follow up ED and Hospitalization and need UDT but done prior to prescribing medication.  States has been walking with her walker.   Past Medical History:  Diagnosis Date   Acute kidney injury (HCC)    Allergy    Anxiety    Arthritis    "99% of my body" (11/11/2016)   Asthma    Bursitis of both hips    Cataract    Cervical scoliosis    Chronic back pain    "all over my back" (11/11/2016)   Depressive disorder, not elsewhere classified    Enthesopathy of hip region    Environmental allergies    "I take Claritin qd; 365 days/year" (11/11/2016)   Essential hypertension, benign    Fibromyalgia    Frequent falls    GERD (gastroesophageal reflux disease)    Headache    "at least 1/wk; may last for 2 days or so" (11/11/2016)   History of blood transfusion  1985   w/hysterectomy   Lumbar stenosis    Migraine    "a few/month" (11/11/2016)   Muscle weakness (generalized)    Myalgia and myositis, unspecified    Osteoporosis    Other abnormal blood chemistry    Other malaise and fatigue    Raynaud's syndrome    Sciatic nerve pain, left    Sciatica    Unspecified vitamin D deficiency    Past Surgical History:  Procedure Laterality Date   ABDOMINAL HYSTERECTOMY  1985   CESAREAN SECTION  1976; 1979   IR KYPHO LUMBAR INC FX REDUCE BONE BX UNI/BIL CANNULATION INC/IMAGING  11/04/2019   SHOULDER OPEN ROTATOR CUFF REPAIR Left     No Known Allergies  Outpatient Encounter Medications as of 01/31/2021  Medication Sig   acetaminophen (TYLENOL) 500 MG tablet Take 1,000 mg by mouth every evening.    amitriptyline (ELAVIL) 150 MG tablet TAKE 1 TABLET AT BEDTIME   ascorbic acid (VITAMIN C) 500 MG tablet Take 1 tablet (500 mg total) by mouth 2 (two) times daily.   aspirin EC 81 MG tablet Take 81 mg by mouth in the morning and at bedtime.   carboxymethylcellulose (REFRESH PLUS) 0.5 % SOLN Place 2 drops into both eyes 2 (two) times daily as needed (dry eyes).    citalopram (CELEXA) 20 MG tablet TAKE 1 TABLET BY MOUTH EVERY DAY   [employer] D3 50 MCG (  2000 UT) CAPS TAKE 1 CAPSULE BY MOUTH EVERY DAY   fluticasone (FLONASE) 50 MCG/ACT nasal spray Place 2 sprays into both nostrils daily as needed for allergies or rhinitis.   furosemide (LASIX) 40 MG tablet Take 1 tablet (40 mg total) by mouth daily.   lidocaine (LIDODERM) 5 % Place 1 patch onto the skin daily. Remove & Discard patch within 12 hours or as directed by MD   loratadine (CLARITIN) 10 MG tablet TAKE 1 TABLET BY MOUTH EVERY DAY   metoprolol tartrate (LOPRESSOR) 50 MG tablet TAKE ONE TABLET BY MOUTH TWICE DAILY FOR BLOOD PRESSURE   oxyCODONE (ROXICODONE) 15 MG immediate release tablet Take 1 tablet (15 mg total) by mouth every 8 (eight) hours as needed for pain.   polyethylene glycol (MIRALAX / GLYCOLAX) 17 g  packet Take 17 g by mouth daily.   potassium chloride (KLOR-CON) 10 MEQ tablet Take 1 tablet (10 mEq total) by mouth daily.   SUMAtriptan (IMITREX) 25 MG tablet TAKE ONE TABLET BY MOUTH ONCE FOR HEADACHE. MAY REPEAT IN 2 HOURS IF HEADACHE PERSISTS OR RECURS.   vitamin B-12 (CYANOCOBALAMIN) 500 MCG tablet Take 1 tablet (500 mcg total) by mouth daily.   [DISCONTINUED] citalopram (CELEXA) 40 MG tablet Take 0.5 tablets (20 mg total) by mouth daily. (Patient not taking: Reported on 09/06/2020)   No facility-administered encounter medications on file as of 01/31/2021.    Review of Systems  Constitutional:  Negative for appetite change, chills, fatigue, fever and unexpected weight change.  Eyes:  Negative for pain, discharge, redness, itching and visual disturbance.  Respiratory:  Negative for cough, chest tightness, shortness of breath and wheezing.   Cardiovascular:  Negative for chest pain, palpitations and leg swelling.  Gastrointestinal:  Negative for abdominal distention, abdominal pain, blood in stool, constipation, diarrhea, nausea and vomiting.  Musculoskeletal:  Positive for arthralgias and gait problem. Negative for back pain, joint swelling and myalgias.  Neurological:  Negative for dizziness, syncope, speech difficulty, weakness, light-headedness, numbness and headaches.  Psychiatric/Behavioral:  Negative for agitation, behavioral problems, confusion, hallucinations and sleep disturbance. The patient is not nervous/anxious.    Immunization History  Administered Date(s) Administered   Fluad Quad(high Dose 65+) 03/29/2019   Influenza,inj,Quad PF,6+ Mos 05/23/2015, 05/28/2016, 03/24/2018   Influenza-Unspecified 07/07/2012, 02/28/2014   PFIZER(Purple Top)SARS-COV-2 Vaccination 10/03/2019   Pneumococcal Conjugate-13 07/14/2014   Pneumococcal Polysaccharide-23 09/11/2017, 11/03/2019   Tdap 01/26/2016   Pertinent  Health Maintenance Due  Topic Date Due   COLONOSCOPY (Pts 45-87yrs Insurance  coverage will need to be confirmed)  Never done   MAMMOGRAM  09/04/2019   INFLUENZA VACCINE  01/28/2021   DEXA SCAN  Completed   PNA vac Low Risk Adult  Completed   Fall Risk  01/31/2021 08/16/2020 04/03/2020 03/09/2020 12/06/2019  Falls in the past year? 0 1 1 1 1   Number falls in past yr: 0 1 1 1 1   Comment - - - - -  Injury with Fall? 0 0 1 1 1   Comment - - - - -  Risk Factor Category  - - - - -  Risk for fall due to : No Fall Risks - - - -  Follow up Falls evaluation completed - - - -   Functional Status Survey:    There were no vitals filed for this visit. There is no height or weight on file to calculate BMI. Physical Exam  Unable to complete on telephone visit.   Labs reviewed: Recent Labs    08/06/20  1125 08/16/20 0915 09/06/20 1145 09/08/20 0806 09/09/20 1102 09/19/20 0826  NA  --  140   < > 137 136 136  K  --  3.3*   < > 3.4* 4.2 3.1*  CL  --  98   < > 101 101 98  CO2  --  28   < > 28 26 25   GLUCOSE  --  109*   < > 94 84 120*  BUN  --  9   < > 7* 9 15  CREATININE  --  0.92   < > 0.86 0.93 1.04*  CALCIUM  --  9.5   < > 8.5* 8.8* 9.3  MG 1.5* 1.5  --   --   --  1.9   < > = values in this interval not displayed.   Recent Labs    04/03/20 1730 09/06/20 1145 09/09/20 1102  AST 12 16 18   ALT 8 10 10   ALKPHOS  --  52 44  BILITOT 0.8 1.1 0.9  PROT 7.3 7.2 6.0*  ALBUMIN  --  3.8 3.1*   Recent Labs    09/06/20 1145 09/09/20 1102 09/19/20 0826  WBC 5.9 6.1 14.7*  NEUTROABS 2.9 2.3 9.5*  HGB 11.8* 11.0* 12.7  HCT 37.8 35.6* 40.4  MCV 87.3 89.4 87.8  PLT 456* 358 382   Lab Results  Component Value Date   TSH 1.31 04/03/2020   Lab Results  Component Value Date   HGBA1C 5.3 03/29/2019   Lab Results  Component Value Date   CHOL 211 (H) 04/03/2020   HDL 62 04/03/2020   LDLCALC 128 (H) 04/03/2020   TRIG 103 04/03/2020   CHOLHDL 3.4 04/03/2020    Significant Diagnostic Results in last 30 days:  No results found.  Assessment/Plan  1.  Fibromyalgia On oxycodone  Advice to come to office will need UDT patient stated " thank for nothing" and hanged up on provider when told pain medication will not be prescribed until she comes to be seen at the office.  2. Chronic bilateral low back pain with bilateral sciatica Pain regimen as above.  3. Chronic pain syndrome Advised to follow in office  Family/ staff Communication: Reviewed plan of care with patient verbalized understanding but hanged up on provider   Labs/tests ordered: None   Next Appointment: Hanged up on provider prior to scheduling visit.  I connected with  Melinda [name] on 01/31/21 by a Telephone enabled telemedicine application and verified that I am speaking with the correct person using two identifiers.   I discussed the limitations of evaluation and management by telemedicine. The patient expressed understanding and agreed to proceed.  Spent 11 minutes of non-face to face with patient     06/03/2020, NP

## 2021-02-01 DIAGNOSIS — R4182 Altered mental status, unspecified: Secondary | ICD-10-CM | POA: Diagnosis not present

## 2021-02-01 DIAGNOSIS — M797 Fibromyalgia: Secondary | ICD-10-CM | POA: Diagnosis not present

## 2021-02-01 DIAGNOSIS — G9341 Metabolic encephalopathy: Secondary | ICD-10-CM | POA: Diagnosis not present

## 2021-02-01 DIAGNOSIS — D649 Anemia, unspecified: Secondary | ICD-10-CM | POA: Diagnosis not present

## 2021-02-01 DIAGNOSIS — I10 Essential (primary) hypertension: Secondary | ICD-10-CM | POA: Diagnosis not present

## 2021-02-01 DIAGNOSIS — R627 Adult failure to thrive: Secondary | ICD-10-CM | POA: Diagnosis not present

## 2021-02-01 DIAGNOSIS — I5030 Unspecified diastolic (congestive) heart failure: Secondary | ICD-10-CM | POA: Diagnosis not present

## 2021-02-03 DIAGNOSIS — M542 Cervicalgia: Secondary | ICD-10-CM | POA: Diagnosis not present

## 2021-02-03 DIAGNOSIS — Z043 Encounter for examination and observation following other accident: Secondary | ICD-10-CM | POA: Diagnosis not present

## 2021-02-03 DIAGNOSIS — I4891 Unspecified atrial fibrillation: Secondary | ICD-10-CM | POA: Diagnosis not present

## 2021-02-03 DIAGNOSIS — G8911 Acute pain due to trauma: Secondary | ICD-10-CM | POA: Diagnosis not present

## 2021-02-03 DIAGNOSIS — Z79899 Other long term (current) drug therapy: Secondary | ICD-10-CM | POA: Diagnosis not present

## 2021-02-03 DIAGNOSIS — F32A Depression, unspecified: Secondary | ICD-10-CM | POA: Diagnosis not present

## 2021-02-03 DIAGNOSIS — S0990XA Unspecified injury of head, initial encounter: Secondary | ICD-10-CM | POA: Diagnosis not present

## 2021-02-03 DIAGNOSIS — I10 Essential (primary) hypertension: Secondary | ICD-10-CM | POA: Diagnosis not present

## 2021-02-03 DIAGNOSIS — Z7901 Long term (current) use of anticoagulants: Secondary | ICD-10-CM | POA: Diagnosis not present

## 2021-02-03 DIAGNOSIS — S0003XA Contusion of scalp, initial encounter: Secondary | ICD-10-CM | POA: Diagnosis not present

## 2021-02-03 DIAGNOSIS — W1830XA Fall on same level, unspecified, initial encounter: Secondary | ICD-10-CM | POA: Diagnosis not present

## 2021-02-04 DIAGNOSIS — E46 Unspecified protein-calorie malnutrition: Secondary | ICD-10-CM | POA: Diagnosis not present

## 2021-02-04 DIAGNOSIS — Z043 Encounter for examination and observation following other accident: Secondary | ICD-10-CM | POA: Diagnosis not present

## 2021-02-04 DIAGNOSIS — M797 Fibromyalgia: Secondary | ICD-10-CM | POA: Diagnosis not present

## 2021-02-04 DIAGNOSIS — R52 Pain, unspecified: Secondary | ICD-10-CM | POA: Diagnosis not present

## 2021-02-04 DIAGNOSIS — I10 Essential (primary) hypertension: Secondary | ICD-10-CM | POA: Diagnosis not present

## 2021-02-04 DIAGNOSIS — M6281 Muscle weakness (generalized): Secondary | ICD-10-CM | POA: Diagnosis not present

## 2021-02-04 DIAGNOSIS — R296 Repeated falls: Secondary | ICD-10-CM | POA: Diagnosis not present

## 2021-02-04 DIAGNOSIS — Z7401 Bed confinement status: Secondary | ICD-10-CM | POA: Diagnosis not present

## 2021-02-06 DIAGNOSIS — I10 Essential (primary) hypertension: Secondary | ICD-10-CM | POA: Diagnosis not present

## 2021-02-06 DIAGNOSIS — M6281 Muscle weakness (generalized): Secondary | ICD-10-CM | POA: Diagnosis not present

## 2021-02-06 DIAGNOSIS — M797 Fibromyalgia: Secondary | ICD-10-CM | POA: Diagnosis not present

## 2021-02-06 DIAGNOSIS — E46 Unspecified protein-calorie malnutrition: Secondary | ICD-10-CM | POA: Diagnosis not present

## 2021-02-07 DIAGNOSIS — M1990 Unspecified osteoarthritis, unspecified site: Secondary | ICD-10-CM | POA: Diagnosis not present

## 2021-02-07 DIAGNOSIS — R489 Unspecified symbolic dysfunctions: Secondary | ICD-10-CM | POA: Diagnosis not present

## 2021-02-07 DIAGNOSIS — M7732 Calcaneal spur, left foot: Secondary | ICD-10-CM | POA: Diagnosis not present

## 2021-02-07 DIAGNOSIS — M2550 Pain in unspecified joint: Secondary | ICD-10-CM | POA: Diagnosis not present

## 2021-02-07 DIAGNOSIS — R262 Difficulty in walking, not elsewhere classified: Secondary | ICD-10-CM | POA: Diagnosis not present

## 2021-02-07 DIAGNOSIS — M7731 Calcaneal spur, right foot: Secondary | ICD-10-CM | POA: Diagnosis not present

## 2021-02-08 DIAGNOSIS — M79605 Pain in left leg: Secondary | ICD-10-CM | POA: Diagnosis not present

## 2021-02-08 DIAGNOSIS — M7989 Other specified soft tissue disorders: Secondary | ICD-10-CM | POA: Diagnosis not present

## 2021-02-13 DIAGNOSIS — E46 Unspecified protein-calorie malnutrition: Secondary | ICD-10-CM | POA: Diagnosis not present

## 2021-02-13 DIAGNOSIS — M797 Fibromyalgia: Secondary | ICD-10-CM | POA: Diagnosis not present

## 2021-02-13 DIAGNOSIS — M6281 Muscle weakness (generalized): Secondary | ICD-10-CM | POA: Diagnosis not present

## 2021-02-13 DIAGNOSIS — I10 Essential (primary) hypertension: Secondary | ICD-10-CM | POA: Diagnosis not present

## 2021-02-14 DIAGNOSIS — M2550 Pain in unspecified joint: Secondary | ICD-10-CM | POA: Diagnosis not present

## 2021-02-14 DIAGNOSIS — R262 Difficulty in walking, not elsewhere classified: Secondary | ICD-10-CM | POA: Diagnosis not present

## 2021-02-14 DIAGNOSIS — R489 Unspecified symbolic dysfunctions: Secondary | ICD-10-CM | POA: Diagnosis not present

## 2021-02-14 DIAGNOSIS — M6281 Muscle weakness (generalized): Secondary | ICD-10-CM | POA: Diagnosis not present

## 2021-02-21 DIAGNOSIS — M6281 Muscle weakness (generalized): Secondary | ICD-10-CM | POA: Diagnosis not present

## 2021-02-21 DIAGNOSIS — M1990 Unspecified osteoarthritis, unspecified site: Secondary | ICD-10-CM | POA: Diagnosis not present

## 2021-02-21 DIAGNOSIS — R52 Pain, unspecified: Secondary | ICD-10-CM | POA: Diagnosis not present

## 2021-02-21 DIAGNOSIS — M2550 Pain in unspecified joint: Secondary | ICD-10-CM | POA: Diagnosis not present

## 2021-02-26 ENCOUNTER — Other Ambulatory Visit: Payer: Self-pay

## 2021-02-26 NOTE — Patient Outreach (Signed)
[facility]) Care Management  02/26/2021  Melinda [name] Dec 11, 1950 [id number]   Referral Date: 02/26/21 Referral Source: [employer] Report  Date of Discharge: 02/25/21 Facility: [facility] Insurance: [place]  Outreach attempt: Spoke with son Melinda [name].  He states patient continues to be in the [place] area with him and has been since February 2022.   Plan: RN CM will close case.     [name], RN, MSN [facility] Care Management Care Management Coordinator Direct Line [phone] Toll Free: [phone]  Fax: [phone]

## 2021-03-06 ENCOUNTER — Ambulatory Visit: Payer: Medicare HMO | Admitting: Family

## 2021-03-08 ENCOUNTER — Encounter: Payer: Self-pay | Admitting: Family

## 2021-03-11 ENCOUNTER — Encounter: Payer: Self-pay | Admitting: Family

## 2021-03-11 ENCOUNTER — Ambulatory Visit (INDEPENDENT_AMBULATORY_CARE_PROVIDER_SITE_OTHER): Payer: Medicare HMO | Admitting: Family

## 2021-03-11 VITALS — BP 138/88 | HR 60 | Temp 98.2°F | Resp 16 | Ht 64.0 in | Wt 143.6 lb

## 2021-03-11 DIAGNOSIS — F332 Major depressive disorder, recurrent severe without psychotic features: Secondary | ICD-10-CM | POA: Diagnosis not present

## 2021-03-11 DIAGNOSIS — G63 Polyneuropathy in diseases classified elsewhere: Secondary | ICD-10-CM

## 2021-03-11 DIAGNOSIS — H6123 Impacted cerumen, bilateral: Secondary | ICD-10-CM

## 2021-03-11 DIAGNOSIS — M5441 Lumbago with sciatica, right side: Secondary | ICD-10-CM

## 2021-03-11 DIAGNOSIS — I5032 Chronic diastolic (congestive) heart failure: Secondary | ICD-10-CM

## 2021-03-11 DIAGNOSIS — G894 Chronic pain syndrome: Secondary | ICD-10-CM | POA: Diagnosis not present

## 2021-03-11 DIAGNOSIS — I10 Essential (primary) hypertension: Secondary | ICD-10-CM

## 2021-03-11 DIAGNOSIS — G43009 Migraine without aura, not intractable, without status migrainosus: Secondary | ICD-10-CM | POA: Diagnosis not present

## 2021-03-11 DIAGNOSIS — M797 Fibromyalgia: Secondary | ICD-10-CM

## 2021-03-11 DIAGNOSIS — E782 Mixed hyperlipidemia: Secondary | ICD-10-CM

## 2021-03-11 DIAGNOSIS — M5442 Lumbago with sciatica, left side: Secondary | ICD-10-CM

## 2021-03-11 DIAGNOSIS — G8929 Other chronic pain: Secondary | ICD-10-CM

## 2021-03-11 DIAGNOSIS — R634 Abnormal weight loss: Secondary | ICD-10-CM

## 2021-03-11 DIAGNOSIS — Z1231 Encounter for screening mammogram for malignant neoplasm of breast: Secondary | ICD-10-CM

## 2021-03-11 DIAGNOSIS — Z1211 Encounter for screening for malignant neoplasm of colon: Secondary | ICD-10-CM

## 2021-03-11 MED ORDER — POTASSIUM CHLORIDE ER 10 MEQ PO TBCR: 10.0000 meq | EXTENDED_RELEASE_TABLET | Freq: Every day | ORAL | 1 refills | Status: DC

## 2021-03-11 MED ORDER — OXYCODONE HCL 15 MG PO TABS: 15.0000 mg | ORAL_TABLET | Freq: Three times a day (TID) | ORAL | 0 refills | Status: DC | PRN

## 2021-03-11 MED ORDER — DEBROX 6.5 % OT SOLN: 5.0000 [drp] | Freq: Two times a day (BID) | OTIC | 0 refills | Status: AC

## 2021-03-11 MED ORDER — SUMATRIPTAN SUCCINATE 25 MG PO TABS: ORAL_TABLET | ORAL | 1 refills | Status: DC

## 2021-03-11 MED ORDER — CITALOPRAM HYDROBROMIDE 20 MG PO TABS: 20.0000 mg | ORAL_TABLET | Freq: Every day | ORAL | 1 refills | Status: DC

## 2021-03-11 MED ORDER — METOPROLOL TARTRATE 50 MG PO TABS: ORAL_TABLET | ORAL | 1 refills | Status: DC

## 2021-03-11 MED ORDER — AMITRIPTYLINE HCL 150 MG PO TABS: 150.0000 mg | ORAL_TABLET | Freq: Every day | ORAL | 1 refills | Status: DC

## 2021-03-11 MED ORDER — FUROSEMIDE 40 MG PO TABS: 40.0000 mg | ORAL_TABLET | Freq: Every day | ORAL | 1 refills | Status: DC

## 2021-03-11 NOTE — Patient Instructions (Signed)
-   Instill debrox 6.5 otic solution 5 drops into each ear twice daily x 4 days then follow up for ear lavage.May apply cotton ball at bedtime to prevent drainage to pillow.  

## 2021-03-11 NOTE — Progress Notes (Signed)
Provider: Marlowe Sax FNP-C   Darian Ace, Nelda Bucks, NP  Patient Care Team: Jalyne Brodzinski, Nelda Bucks, NP as PCP - General (Family Medicine)  Extended Emergency Contact Information Primary Emergency Contact: Glennie [place] Mobile Phone: [phone] Relation: Son Secondary Emergency Contact: Dutch,Lashawn Mobile Phone: [phone] Relation: Daughter  Code Status:  Full Code  Goals of care: Advanced Directive information Advanced Directives 03/11/2021  Does Patient Have a Medical Advance Directive? No  Type of Advance Directive -  Does patient want to make changes to medical advance directive? -  Copy of [place] in Chart? -  Would patient like information on creating a medical advance directive? No - Patient declined     Chief Complaint  Patient presents with   Medical Management of Chronic Issues    7 month follow up.   Health Maintenance    Discuss the need for Urine Microalbumin, Mammogram, and Colonoscopy.   Immunizations    Discuss the need for Shingrix vaccine, Influenza vaccine, and 2nd Covid Vaccine.    HPI:  Pt is a 70 y.o. female seen today for 7 months follow up for medical management of chronic diseases. She is here with son from [place] who states recently took patient to live with her temporarily but patient will keep her apartment here in [place] and will continue to follow up in office here for her appointments.she does not wish to swish provider since she will be back to her apartment.  She reports no recent fall episode or recent Hospital admission. Son is concerned patient has lost weight.states patient drinks Ensure protein supplement 4- 5 cans per day but not eating her meals. States now that son is preparing meals her appetite has improved but continues to drink protein supplement most of the time.   Continues to walker with Rolator.son has stairs in the house and sometimes has to get a Friend to assist transfer  patient up and down the stairs in his house.Also has an extra room down stairs which patient usually sleeps if unable to take her up the stairs.  She continue to require pain medication for fibromyalgia and lower back pain.will renew her Narcotic use contract today.  She denies any chest pain, shortness of breath, lower extremity edema, fatigue, palpitations, weakness, presyncope, syncope, orthopnea, and PND.  Due for mammogram and colonoscopy screening.will collect urine for MicroAlbumin today.  Advised to get her Shingrix and  COVID-19 2nd booster vaccine at her pharmacy.    Past Medical History:  Diagnosis Date   Acute kidney injury ([place])    Allergy    Anxiety    Arthritis    "99% of my body" (11/11/2016)   Asthma    Bursitis of both hips    Cataract    Cervical scoliosis    Chronic back pain    "all over my back" (11/11/2016)   Depressive disorder, not elsewhere classified    Enthesopathy of hip region    Environmental allergies    "I take Claritin qd; 365 days/year" (11/11/2016)   Essential hypertension, benign    Fibromyalgia    Frequent falls    GERD (gastroesophageal reflux disease)    Headache    "at least 1/wk; may last for 2 days or so" (11/11/2016)   History of blood transfusion 1985   w/hysterectomy   Lumbar stenosis    Migraine    "a few/month" (11/11/2016)   Muscle weakness (generalized)    Myalgia and myositis, unspecified  Osteoporosis    Other abnormal blood chemistry    Other malaise and fatigue    Raynaud's syndrome    Sciatic nerve pain, left    Sciatica    Unspecified vitamin D deficiency    Past Surgical History:  Procedure Laterality Date   [place]; [place] UNI/BIL CANNULATION INC/IMAGING  11/04/2019   SHOULDER OPEN ROTATOR CUFF REPAIR Left     No Known Allergies  Allergies as of 03/11/2021   No Known Allergies      Medication List        Accurate as of  March 11, 2021 11:02 AM. If you have any questions, ask your nurse or doctor.          STOP taking these medications    polyethylene glycol 17 g packet Commonly known as: MIRALAX / GLYCOLAX Stopped by: Sandrea Hughs, NP       TAKE these medications    acetaminophen 500 MG tablet Commonly known as: TYLENOL Take 1,000 mg by mouth every evening.   amitriptyline 150 MG tablet Commonly known as: ELAVIL TAKE 1 TABLET AT BEDTIME   ascorbic acid 500 MG tablet Commonly known as: VITAMIN C Take 1 tablet (500 mg total) by mouth 2 (two) times daily.   aspirin EC 81 MG tablet Take 81 mg by mouth in the morning and at bedtime.   carboxymethylcellulose 0.5 % Soln Commonly known as: REFRESH PLUS Place 2 drops into both eyes 2 (two) times daily as needed (dry eyes).   citalopram 20 MG tablet Commonly known as: CELEXA TAKE 1 TABLET BY MOUTH EVERY DAY   [employer] D3 50 MCG (2000 UT) Caps Generic drug: Cholecalciferol TAKE 1 CAPSULE BY MOUTH EVERY DAY   fluticasone 50 MCG/ACT nasal spray Commonly known as: FLONASE Place 2 sprays into both nostrils daily as needed for allergies or rhinitis.   furosemide 40 MG tablet Commonly known as: LASIX Take 1 tablet (40 mg total) by mouth daily.   lidocaine 5 % Commonly known as: Lidoderm Place 1 patch onto the skin daily. Remove & Discard patch within 12 hours or as directed by MD   loratadine 10 MG tablet Commonly known as: CLARITIN TAKE 1 TABLET BY MOUTH EVERY DAY   metoprolol tartrate 50 MG tablet Commonly known as: LOPRESSOR TAKE ONE TABLET BY MOUTH TWICE DAILY FOR BLOOD PRESSURE   oxyCODONE 15 MG immediate release tablet Commonly known as: ROXICODONE Take 1 tablet (15 mg total) by mouth every 8 (eight) hours as needed for pain.   potassium chloride 10 MEQ tablet Commonly known as: KLOR-CON Take 1 tablet (10 mEq total) by mouth daily.   SUMAtriptan 25 MG tablet Commonly known as: IMITREX TAKE ONE TABLET BY MOUTH ONCE FOR  HEADACHE. MAY REPEAT IN 2 HOURS IF HEADACHE PERSISTS OR RECURS.   vitamin B-12 500 MCG tablet Commonly known as: CYANOCOBALAMIN Take 1 tablet (500 mcg total) by mouth daily.        Review of Systems  Constitutional:  Positive for unexpected weight change. Negative for appetite change, chills, fatigue and fever.  HENT:  Negative for congestion, dental problem, ear discharge, ear pain, facial swelling, hearing loss, nosebleeds, postnasal drip, rhinorrhea, sinus pressure, sinus pain, sneezing, sore throat, tinnitus and trouble swallowing.   Eyes:  Negative for pain, discharge, redness, itching and visual disturbance.  Respiratory:  Negative for cough, chest tightness, shortness of breath and  wheezing.   Cardiovascular:  Negative for chest pain, palpitations and leg swelling.  Gastrointestinal:  Negative for abdominal distention, abdominal pain, blood in stool, constipation, diarrhea, nausea and vomiting.  Endocrine: Negative for cold intolerance, heat intolerance, polydipsia, polyphagia and polyuria.  Genitourinary:  Negative for difficulty urinating, dysuria, flank pain, frequency and urgency.  Musculoskeletal:  Positive for arthralgias, gait problem and myalgias. Negative for back pain, joint swelling, neck pain and neck stiffness.  Skin:  Negative for color change, pallor, rash and wound.  Neurological:  Negative for dizziness, syncope, speech difficulty, weakness, light-headedness, numbness and headaches.  Hematological:  Does not bruise/bleed easily.  Psychiatric/Behavioral:  Negative for agitation, behavioral problems, confusion, hallucinations, self-injury, sleep disturbance and suicidal ideas. The patient is not nervous/anxious.    Immunization History  Administered Date(s) Administered   Fluad Quad(high Dose 65+) 03/29/2019   Influenza,inj,Quad PF,6+ Mos 05/23/2015, 05/28/2016, 03/24/2018   Influenza-Unspecified 07/07/2012, 02/28/2014   PFIZER(Purple Top)SARS-COV-2 Vaccination  10/03/2019   Pneumococcal Conjugate-13 07/14/2014   Pneumococcal Polysaccharide-23 09/11/2017, 11/03/2019   Tdap 01/26/2016   Pertinent  Health Maintenance Due  Topic Date Due   URINE MICROALBUMIN  Never done   COLONOSCOPY (Pts 45-9yrs Insurance coverage will need to be confirmed)  Never done   MAMMOGRAM  09/04/2019   INFLUENZA VACCINE  01/28/2021   DEXA SCAN  Completed   PNA vac Low Risk Adult  Completed   Fall Risk  03/11/2021 03/08/2021 01/31/2021 08/16/2020 04/03/2020  Falls in the past year? 0 0 0 1 1  Number falls in past yr: 0 0 0 1 1  Comment - - - - -  Injury with Fall? 0 0 0 0 1  Comment - - - - -  Risk Factor Category  - - - - -  Risk for fall due to : History of fall(s) History of fall(s) No Fall Risks - -  Follow up Falls evaluation completed Falls evaluation completed Falls evaluation completed - -   Functional Status Survey:    Vitals:   03/11/21 1041  BP: 138/88  Pulse: 60  Resp: 16  Temp: 98.2 F (36.8 C)  SpO2: 98%  Weight: 143 lb 9.6 oz (65.1 kg)  Height: $Remove'5\' 4"'gEHlpjU$  (1.626 m)   Body mass index is 24.65 kg/m. Physical Exam Vitals reviewed.  Constitutional:      General: She is not in acute distress.    Appearance: Normal appearance. She is normal weight. She is not ill-appearing or diaphoretic.  HENT:     Head: Normocephalic.     Right Ear: There is impacted cerumen.     Left Ear: There is impacted cerumen.     Nose: Nose normal. No congestion or rhinorrhea.     Mouth/Throat:     Mouth: Mucous membranes are moist.     Pharynx: Oropharynx is clear. No oropharyngeal exudate or posterior oropharyngeal erythema.  Eyes:     General: No scleral icterus.       Right eye: No discharge.        Left eye: No discharge.     Extraocular Movements: Extraocular movements intact.     Conjunctiva/sclera: Conjunctivae normal.     Pupils: Pupils are equal, round, and reactive to light.  Neck:     Vascular: No carotid bruit.  Cardiovascular:     Rate and Rhythm:  Normal rate and regular rhythm.     Pulses: Normal pulses.     Heart sounds: Normal heart sounds. No murmur heard.   No friction rub.  No gallop.  Pulmonary:     Effort: Pulmonary effort is normal. No respiratory distress.     Breath sounds: Normal breath sounds. No wheezing, rhonchi or rales.  Chest:     Chest wall: No tenderness.  Abdominal:     General: Bowel sounds are normal. There is no distension.     Palpations: Abdomen is soft. There is no mass.     Tenderness: There is no abdominal tenderness. There is no right CVA tenderness, left CVA tenderness, guarding or rebound.  Musculoskeletal:        General: No swelling or tenderness. Normal range of motion.     Cervical back: Normal range of motion. No rigidity or tenderness.     Right lower leg: Edema present.     Left lower leg: Edema present.     Comments: Unsteady gait ambulates with Rolator   Lymphadenopathy:     Cervical: No cervical adenopathy.  Skin:    General: Skin is warm and dry.     Coloration: Skin is not pale.     Findings: No bruising, erythema, lesion or rash.  Neurological:     Mental Status: She is alert. Mental status is at baseline.     Cranial Nerves: No cranial nerve deficit.     Sensory: No sensory deficit.     Motor: No weakness.     Coordination: Coordination normal.     Gait: Gait abnormal.  Psychiatric:        Mood and Affect: Mood normal.        Speech: Speech normal.        Behavior: Behavior normal.        Thought Content: Thought content normal.        Judgment: Judgment normal.    Labs reviewed: Recent Labs    08/06/20 1125 08/16/20 0915 09/06/20 1145 09/08/20 0806 09/09/20 1102 09/19/20 0826  NA  --  140   < > 137 136 136  K  --  3.3*   < > 3.4* 4.2 3.1*  CL  --  98   < > 101 101 98  CO2  --  28   < > _0 GLUCOSE  --  109*   < > 94 84 120*  BUN  --  9   < > 7* 9 15  CREATININE  --  0.92   < > 0.86 0.93 1.04*  CALCIUM  --  9.5   < > 8.5* 8.8* 9.3  MG 1.5* 1.5  --   --    --  1.9   < > = values in this interval not displayed.   Recent Labs    04/03/20 1730 09/06/20 1145 09/09/20 1102  AST _1 ALT _2 ALKPHOS  --  52 44  BILITOT 0.8 1.1 0.9  PROT 7.3 7.2 6.0*  ALBUMIN  --  3.8 3.1*   Recent Labs    09/06/20 1145 09/09/20 1102 09/19/20 0826  WBC 5.9 6.1 14.7*  NEUTROABS 2.9 2.3 9.5*  HGB 11.8* 11.0* 12.7  HCT 37.8 35.6* 40.4  MCV 87.3 89.4 87.8  PLT 456* 358 382   Lab Results  Component Value Date   TSH 1.31 04/03/2020   Lab Results  Component Value Date   HGBA1C 5.3 03/29/2019   Lab Results  Component Value Date   CHOL 211 (H) 04/03/2020   HDL 62 04/03/2020   LDLCALC 128 (H) 04/03/2020   TRIG 103 04/03/2020  CHOLHDL 3.4 04/03/2020    Significant Diagnostic Results in last 30 days:  No results found.  Assessment/Plan  1. Chronic diastolic congestive heart failure (HCC) Bilateral lower extremities 1-2+ edema. No signs of fluid over load noted Continue to monitor weight if progressive weight loss reported will decrease Furosemide from 40 mg to 20 mg tablet daily.  - low sodium diet  - furosemide (LASIX) 40 MG tablet; Take 1 tablet (40 mg total) by mouth daily.  Dispense: 90 tablet; Refill: 1  2. Migraine without aura and without status migrainosus, not intractable Imitrex effective  - SUMAtriptan (IMITREX) 25 MG tablet; May repeat in 2 hours if headache persists or recurs.  Dispense: 9 tablet; Refill: 1  3. Chronic bilateral low back pain with bilateral sciatica Chronic pain  - continue on current pain regimen.  - oxyCODONE (ROXICODONE) 15 MG immediate release tablet; Take 1 tablet (15 mg total) by mouth every 8 (eight) hours as needed for pain.  Dispense: 90 tablet; Refill: 0 - Urine Drug Screen w/Alc, no confirm([facility])  4. Chronic pain syndrome Narcotic use contract renewed this visit .PMDP reviewed.  - oxyCODONE (ROXICODONE) 15 MG immediate release tablet; Take 1 tablet (15 mg total) by mouth every 8  (eight) hours as needed for pain.  Dispense: 90 tablet; Refill: 0 - Urine Drug Screen w/Alc, no confirm([facility])  5. Fibromyalgia Continue current regimen - oxyCODONE (ROXICODONE) 15 MG immediate release tablet; Take 1 tablet (15 mg total) by mouth every 8 (eight) hours as needed for pain.  Dispense: 90 tablet; Refill: 0  6. Essential hypertension B/p well controlled  - CBC with Differential/Platelet - metoprolol tartrate (LOPRESSOR) 50 MG tablet; TAKE ONE TABLET BY MOUTH TWICE DAILY FOR BLOOD PRESSURE  Dispense: 180 tablet; Refill: 1 - CMP with eGFR([facility]) - TSH  7. Severe episode of recurrent major depressive disorder, without psychotic features ([place]) Mood stable. Continue on amitriptyline  - amitriptyline (ELAVIL) 150 MG tablet; Take 1 tablet (150 mg total) by mouth at bedtime.  Dispense: 90 tablet; Refill: 1 - TSH  8. Mixed hyperlipidemia Previous LDL not at goal. Will recheck lipid panel if still elevated will recommend statin therapy.  Continue on heart healthy diet  - Lipid Panel  9. Bilateral impacted cerumen Bilateral TM not visualized  Advised to Instill debrox 6.5 otic solution 5 drops into each ear twice daily x 4 days then follow up for ear lavage.May apply cotton ball at bedtime to prevent drainage to pillow. - carbamide peroxide (DEBROX) 6.5 % OTIC solution; Place 5 drops into both ears 2 (two) times daily for 4 days.  Dispense: 15 mL; Refill: 0  10. Breast cancer screening by mammogram Asymptomatic  Made aware imaging center will call to schedule appointment - MM DIGITAL SCREENING BILATERAL; Future  11. Colon cancer screening Asymptomatic. Made aware GI specialist office will call her for appointment - Ambulatory referral to Gastroenterology  12. Polyneuropathy associated with underlying disease ([place]) Chronic lower back pain contributes to her frequent fall episode.  Off Gabapentin  Has completed Physical Therapy   13. Weight loss Drinks 4-5 ensure daily  instead of eating her meals but recently moved in with son in [place] who will be cooking her meals. Will monitor weight for now   Family/ staff Communication: Reviewed plan of care with patient and son verbalized understanding   Labs/tests ordered:  - CBC with Differential/Platelet - CMP with eGFR([facility]) - TSH - Lipid panel - Urine Drug Screen w/Alc, no confirm([facility])  Next Appointment :  6 months for medical management of chronic issues.Fasting Labs prior to visit.    Sandrea Hughs, NP

## 2021-03-13 LAB — DRUG MONITOR, PANEL 1, W/CONF, URINE
Amphetamines: NEGATIVE ng/mL (ref <500)
Barbiturates: NEGATIVE ng/mL (ref <300)
Benzodiazepines: NEGATIVE ng/mL (ref <100)
Cocaine Metabolite: NEGATIVE ng/mL (ref <150)
Codeine: NEGATIVE ng/mL (ref <50)
Creatinine: 70.1 mg/dL (ref >=20.0)
Hydrocodone: 55 ng/mL — ABNORMAL HIGH (ref <50)
Hydromorphone: NEGATIVE ng/mL (ref <50)
Marijuana Metabolite: NEGATIVE ng/mL (ref <20)
Methadone Metabolite: NEGATIVE ng/mL (ref <100)
Morphine: NEGATIVE ng/mL (ref <50)
Norhydrocodone: 101 ng/mL — ABNORMAL HIGH (ref <50)
Opiates: POSITIVE ng/mL — AB (ref <100)
Oxidant: NEGATIVE ug/mL (ref <200)
Oxycodone: NEGATIVE ng/mL (ref <100)
Phencyclidine: NEGATIVE ng/mL (ref <25)
pH: 6.2 (ref 4.5–9.0)

## 2021-03-13 LAB — LIPID PANEL
Cholesterol: 179 mg/dL (ref <200)
HDL: 69 mg/dL (ref >=50)
LDL Cholesterol (Calc): 91 mg/dL (calc)
Non-HDL Cholesterol (Calc): 110 mg/dL (calc) (ref <130)
Total CHOL/HDL Ratio: 2.6 (calc) (ref <5.0)
Triglycerides: 94 mg/dL (ref <150)

## 2021-03-13 LAB — COMPLETE METABOLIC PANEL WITH GFR
AG Ratio: 1.8 (calc) (ref 1.0–2.5)
ALT: 13 U/L (ref 6–29)
AST: 14 U/L (ref 10–35)
Albumin: 4.4 g/dL (ref 3.6–5.1)
Alkaline phosphatase (APISO): 53 U/L (ref 37–153)
BUN/Creatinine Ratio: 53 (calc) — ABNORMAL HIGH (ref 6–22)
BUN: 48 mg/dL — ABNORMAL HIGH (ref 7–25)
CO2: 22 mmol/L (ref 20–32)
Calcium: 10 mg/dL (ref 8.6–10.4)
Chloride: 106 mmol/L (ref 98–110)
Creat: 0.91 mg/dL (ref 0.50–1.05)
Globulin: 2.4 g/dL (calc) (ref 1.9–3.7)
Glucose, Bld: 86 mg/dL (ref 65–99)
Potassium: 4.1 mmol/L (ref 3.5–5.3)
Sodium: 139 mmol/L (ref 135–146)
Total Bilirubin: 0.4 mg/dL (ref 0.2–1.2)
Total Protein: 6.8 g/dL (ref 6.1–8.1)
eGFR: 68 mL/min/{1.73_m2} (ref >=60)

## 2021-03-13 LAB — CBC WITH DIFFERENTIAL/PLATELET
Absolute Monocytes: 720 cells/uL (ref 200–950)
Basophils Absolute: 59 cells/uL (ref 0–200)
Basophils Relative: 1 %
Eosinophils Absolute: 218 cells/uL (ref 15–500)
Eosinophils Relative: 3.7 %
HCT: 29.9 % — ABNORMAL LOW (ref 35.0–45.0)
Hemoglobin: 9.8 g/dL — ABNORMAL LOW (ref 11.7–15.5)
Lymphs Abs: 1628 cells/uL (ref 850–3900)
MCH: 28.6 pg (ref 27.0–33.0)
MCHC: 32.8 g/dL (ref 32.0–36.0)
MCV: 87.2 fL (ref 80.0–100.0)
MPV: 12.4 fL (ref 7.5–12.5)
Monocytes Relative: 12.2 %
Neutro Abs: 3275 cells/uL (ref 1500–7800)
Neutrophils Relative %: 55.5 %
Platelets: 411 10*3/uL — ABNORMAL HIGH (ref 140–400)
RBC: 3.43 10*6/uL — ABNORMAL LOW (ref 3.80–5.10)
RDW: 13.2 % (ref 11.0–15.0)
Total Lymphocyte: 27.6 %
WBC: 5.9 10*3/uL (ref 3.8–10.8)

## 2021-03-13 LAB — DM TEMPLATE

## 2021-03-13 LAB — TSH: TSH: 2.64 mIU/L (ref 0.40–4.50)

## 2021-03-21 ENCOUNTER — Other Ambulatory Visit: Payer: Self-pay | Admitting: Family

## 2021-03-21 DIAGNOSIS — R32 Unspecified urinary incontinence: Secondary | ICD-10-CM

## 2021-03-21 DIAGNOSIS — R29898 Other symptoms and signs involving the musculoskeletal system: Secondary | ICD-10-CM | POA: Diagnosis not present

## 2021-03-21 DIAGNOSIS — G63 Polyneuropathy in diseases classified elsewhere: Secondary | ICD-10-CM | POA: Diagnosis not present

## 2021-03-21 DIAGNOSIS — M5442 Lumbago with sciatica, left side: Secondary | ICD-10-CM | POA: Diagnosis not present

## 2021-03-28 ENCOUNTER — Telehealth: Payer: Self-pay

## 2021-03-28 NOTE — Telephone Encounter (Signed)
Patient called and left voicemail for Clinical Intake. Patient states that she would like Citalopram 20mg  increased by 10mg . Equaling to Citalopram 30mg . Patient also request for Amitriptyline 150mg  by 10mg  Equaling Amitriptyline 160mg . I called patient and asked her why she wanted medication increased? Patient states that she's been more depressed currently. Message routed to PCP [name], , NP . Please Advise.

## 2021-03-28 NOTE — Telephone Encounter (Signed)
Increase Amitriptyline from 150 to 160 mg tablet per patient's request. Will avoid adjusting both citalopram and Amitriptyline.High dose of citalopram not recommended due to side effects. If symptoms worsen we can increase amitriptyline to 150 mg tablet twice daily.

## 2021-03-29 ENCOUNTER — Other Ambulatory Visit: Payer: Self-pay

## 2021-03-29 MED ORDER — AMITRIPTYLINE HCL 10 MG PO TABS: 10.0000 mg | ORAL_TABLET | Freq: Every day | ORAL | 1 refills | Status: DC

## 2021-03-29 NOTE — Telephone Encounter (Signed)
Noted.Script send to pharmacy

## 2021-03-29 NOTE — Telephone Encounter (Signed)
Called patient and no answer. Voicemail was left with office call back number.  Patient prescription pend and sent to PCP [name], [name], NP for approval.

## 2021-03-29 NOTE — Progress Notes (Signed)
Medication has warnings. Please sign off on prescription. Medication pend and sent to PCP [name], [name], NP for approval.

## 2021-04-09 ENCOUNTER — Other Ambulatory Visit: Payer: Self-pay

## 2021-04-09 DIAGNOSIS — G8929 Other chronic pain: Secondary | ICD-10-CM

## 2021-04-09 DIAGNOSIS — G894 Chronic pain syndrome: Secondary | ICD-10-CM

## 2021-04-09 DIAGNOSIS — M797 Fibromyalgia: Secondary | ICD-10-CM

## 2021-04-09 MED ORDER — OXYCODONE HCL 15 MG PO TABS: 15.0000 mg | ORAL_TABLET | Freq: Three times a day (TID) | ORAL | 0 refills | Status: DC | PRN

## 2021-04-20 DIAGNOSIS — M5442 Lumbago with sciatica, left side: Secondary | ICD-10-CM | POA: Diagnosis not present

## 2021-04-20 DIAGNOSIS — R29898 Other symptoms and signs involving the musculoskeletal system: Secondary | ICD-10-CM | POA: Diagnosis not present

## 2021-04-20 DIAGNOSIS — G63 Polyneuropathy in diseases classified elsewhere: Secondary | ICD-10-CM | POA: Diagnosis not present

## 2021-05-07 ENCOUNTER — Encounter: Payer: Self-pay | Admitting: Family

## 2021-05-07 ENCOUNTER — Other Ambulatory Visit: Payer: Self-pay

## 2021-05-07 ENCOUNTER — Telehealth (INDEPENDENT_AMBULATORY_CARE_PROVIDER_SITE_OTHER): Payer: Medicare HMO | Admitting: Family

## 2021-05-07 ENCOUNTER — Telehealth: Payer: Self-pay | Admitting: *Deleted

## 2021-05-07 DIAGNOSIS — G8929 Other chronic pain: Secondary | ICD-10-CM

## 2021-05-07 DIAGNOSIS — M5442 Lumbago with sciatica, left side: Secondary | ICD-10-CM

## 2021-05-07 DIAGNOSIS — M797 Fibromyalgia: Secondary | ICD-10-CM

## 2021-05-07 DIAGNOSIS — R195 Other fecal abnormalities: Secondary | ICD-10-CM

## 2021-05-07 DIAGNOSIS — M5441 Lumbago with sciatica, right side: Secondary | ICD-10-CM | POA: Diagnosis not present

## 2021-05-07 DIAGNOSIS — G894 Chronic pain syndrome: Secondary | ICD-10-CM

## 2021-05-07 DIAGNOSIS — I5032 Chronic diastolic (congestive) heart failure: Secondary | ICD-10-CM | POA: Diagnosis not present

## 2021-05-07 MED ORDER — FUROSEMIDE 40 MG PO TABS: 40.0000 mg | ORAL_TABLET | Freq: Every day | ORAL | 1 refills | Status: DC

## 2021-05-07 MED ORDER — OXYCODONE HCL 15 MG PO TABS
15.0000 mg | ORAL_TABLET | Freq: Three times a day (TID) | ORAL | 0 refills | Status: DC | PRN
Start: 2021-05-07 — End: 2021-06-07

## 2021-05-07 NOTE — Telephone Encounter (Signed)
Patient called wanting to know the name of the fluid pill that [name] had patient on. Medication spelled out for patient.   Patient then wanted me to discuss her recent labwork with her son, Ladene Artist.  Blood work discussed with him with patient's verbal permission  Son wanted to schedule a mychart visit with [name] and patient to discuss medications.  Scheduled.

## 2021-05-07 NOTE — Progress Notes (Signed)
This service is provided via telemedicine  No vital signs collected/recorded due to the encounter was a telemedicine visit.   Location of patient (ex: home, work):  Home.  Patient consents to a telephone visit:  Yes  Location of the provider (ex: office, home):  [employer].   Name of any referring provider:  Tongela Encinas, Nelda Bucks, NP   Names of all persons participating in the telemedicine service and their role in the encounter:  Patient, Bradly Chris Son, Heriberto Antigua, [place], Purva Vessell, [place], NP.    Time spent on call: 8 minutes spent on the phone with Medical Assistant.     Provider: Marlowe Sax FNP-C  Illyria Sobocinski, Nelda Bucks, NP  Patient Care Team: Nijah Orlich, Nelda Bucks, NP as PCP - General (Family Medicine)  Extended Emergency Contact Information Primary Emergency Contact: Glennie [place] Mobile Phone: [phone] Relation: Son Secondary Emergency Contact: Dutch,Lashawn Mobile Phone: [phone] Relation: Daughter  Code Status:  Full Code  Goals of care: Advanced Directive information Advanced Directives 05/07/2021  Does Patient Have a Medical Advance Directive? No  Type of Advance Directive -  Does patient want to make changes to medical advance directive? -  Copy of [place] in Chart? -  Would patient like information on creating a medical advance directive? No - Patient declined     Chief Complaint  Patient presents with   Acute Visit    Patient wants to discuss medications.     HPI:  Pt is a 70 y.o. female seen today for an acute visit to discuss medication.patient's son on the phone with patient.States patient miss placed her Furosemide prescription.Last filled 03/11/2021 # 90 with one refill on chart review.she denies any cough,abrupt weight gain,shortness of breath or lower extremity edema.  Has had no recent fall episode.  Appetite has improve.Son continues to assist with her ADL's.Son would like to  know whether he can be able to be paid as care giver.Discussed with him to check with county social service/health department.   Her latest Hgb 9.3 previous 12.7.states has dark stool.was advised to get checked for blood in the stool but states did not go to nearest urgent care as told.plans to go.   Discuss with patient's son to get establish with another provider in [place] states to manage her medical condition since she is currently living with son in [place].will be happy to continue with her care when she moves back to [place]. States patient afraid that her medication will be changed if she establishes with another provider in [place].     Past Medical History:  Diagnosis Date   Acute kidney injury ([place])    Allergy    Anxiety    Arthritis    "99% of my body" (11/11/2016)   Asthma    Bursitis of both hips    Cataract    Cervical scoliosis    Chronic back pain    "all over my back" (11/11/2016)   Depressive disorder, not elsewhere classified    Enthesopathy of hip region    Environmental allergies    "I take Claritin qd; 365 days/year" (11/11/2016)   Essential hypertension, benign    Fibromyalgia    Frequent falls    GERD (gastroesophageal reflux disease)    Headache    "at least 1/wk; may last for 2 days or so" (11/11/2016)   History of blood transfusion 1985   w/hysterectomy   Lumbar stenosis    Migraine    "a few/month" (  11/11/2016)   Muscle weakness (generalized)    Myalgia and myositis, unspecified    Osteoporosis    Other abnormal blood chemistry    Other malaise and fatigue    Raynaud's syndrome    Sciatic nerve pain, left    Sciatica    Unspecified vitamin D deficiency    Past Surgical History:  Procedure Laterality Date   ABDOMINAL HYSTERECTOMY  1985   CESAREAN SECTION  1976; 1979   IR KYPHO LUMBAR INC FX REDUCE BONE BX UNI/BIL CANNULATION INC/IMAGING  11/04/2019   SHOULDER OPEN ROTATOR CUFF REPAIR Left     No Known Allergies  Outpatient Encounter  Medications as of 05/07/2021  Medication Sig   acetaminophen (TYLENOL) 500 MG tablet Take 1,000 mg by mouth every evening.    amitriptyline (ELAVIL) 10 MG tablet Take 1 tablet (10 mg total) by mouth daily at 12 noon. Along with 150mg  tablet totaling 160mg .   amitriptyline (ELAVIL) 150 MG tablet Take 1 tablet (150 mg total) by mouth at bedtime.   ascorbic acid (VITAMIN C) 500 MG tablet Take 1 tablet (500 mg total) by mouth 2 (two) times daily.   aspirin EC 81 MG tablet Take 81 mg by mouth in the morning and at bedtime.   carboxymethylcellulose (REFRESH PLUS) 0.5 % SOLN Place 2 drops into both eyes 2 (two) times daily as needed (dry eyes).    citalopram (CELEXA) 20 MG tablet Take 1 tablet (20 mg total) by mouth daily.   CVS D3 50 MCG (2000 UT) CAPS TAKE 1 CAPSULE BY MOUTH EVERY DAY   fluticasone (FLONASE) 50 MCG/ACT nasal spray Place 2 sprays into both nostrils daily as needed for allergies or rhinitis.   furosemide (LASIX) 40 MG tablet Take 1 tablet (40 mg total) by mouth daily.   lidocaine (LIDODERM) 5 % Place 1 patch onto the skin daily. Remove & Discard patch within 12 hours or as directed by MD   loratadine (CLARITIN) 10 MG tablet TAKE 1 TABLET BY MOUTH EVERY DAY   metoprolol tartrate (LOPRESSOR) 50 MG tablet TAKE ONE TABLET BY MOUTH TWICE DAILY FOR BLOOD PRESSURE   oxyCODONE (ROXICODONE) 15 MG immediate release tablet Take 1 tablet (15 mg total) by mouth every 8 (eight) hours as needed for pain.   potassium chloride (KLOR-CON) 10 MEQ tablet Take 1 tablet (10 mEq total) by mouth daily.   SUMAtriptan (IMITREX) 25 MG tablet May repeat in 2 hours if headache persists or recurs.   vitamin B-12 (CYANOCOBALAMIN) 500 MCG tablet Take 1 tablet (500 mcg total) by mouth daily.   No facility-administered encounter medications on file as of 05/07/2021.    Review of Systems  Constitutional:  Negative for appetite change, chills, fatigue, fever and unexpected weight change.  Eyes:  Negative for pain,  discharge, redness, itching and visual disturbance.  Respiratory:  Negative for cough, chest tightness, shortness of breath and wheezing.   Cardiovascular:  Negative for chest pain, palpitations and leg swelling.  Gastrointestinal:  Negative for abdominal distention, abdominal pain, constipation, diarrhea, nausea and vomiting.       Has dark stool   Genitourinary:  Negative for flank pain, hematuria and urgency.       Incontinent   Musculoskeletal:  Positive for arthralgias, back pain and gait problem. Negative for joint swelling, myalgias, neck pain and neck stiffness.       No recent fall episode   Skin:  Negative for color change, pallor, rash and wound.  Neurological:  Negative for dizziness, syncope,  speech difficulty, weakness, light-headedness and headaches.  Hematological:  Does not bruise/bleed easily.   Immunization History  Administered Date(s) Administered   Fluad Quad(high Dose 65+) 03/29/2019   Influenza,inj,Quad PF,6+ Mos 05/23/2015, 05/28/2016, 03/24/2018   Influenza-Unspecified 07/07/2012, 02/28/2014   PFIZER(Purple Top)SARS-COV-2 Vaccination 10/03/2019   Pneumococcal Conjugate-13 07/14/2014   Pneumococcal Polysaccharide-23 09/11/2017, 11/03/2019   Tdap 01/26/2016   Pertinent  Health Maintenance Due  Topic Date Due   URINE MICROALBUMIN  Never done   COLONOSCOPY (Pts 45-20yrs Insurance coverage will need to be confirmed)  Never done   MAMMOGRAM  09/04/2019   DEXA SCAN  Completed   INFLUENZA VACCINE  Discontinued   Fall Risk 09/19/2020 01/31/2021 03/08/2021 03/11/2021 05/07/2021  Falls in the past year? - 0 0 0 0  Number of falls in past year - - - - -  Was there an injury with Fall? - 0 0 0 0  Was there an injury with Fall? - - - - -  Fall Risk Category Calculator - 0 0 0 0  Fall Risk Category - Low Low Low Low  Patient Fall Risk Level High fall risk Low fall risk Low fall risk Low fall risk Low fall risk  Patient at Risk for Falls Due to - No Fall Risks History of  fall(s) History of fall(s) No Fall Risks  Fall risk Follow up - Falls evaluation completed Falls evaluation completed Falls evaluation completed Falls evaluation completed   Functional Status Survey:    There were no vitals filed for this visit. There is no height or weight on file to calculate BMI. Physical Exam  Labs reviewed: Recent Labs    08/06/20 1125 08/16/20 0915 09/06/20 1145 09/09/20 1102 09/19/20 0826 03/11/21 1151  NA  --  140   < > 136 136 139  K  --  3.3*   < > 4.2 3.1* 4.1  CL  --  98   < > 101 98 106  CO2  --  28   < > 26 25 22   GLUCOSE  --  109*   < > 84 120* 86  BUN  --  9   < > 9 15 48*  CREATININE  --  0.92   < > 0.93 1.04* 0.91  CALCIUM  --  9.5   < > 8.8* 9.3 10.0  MG 1.5* 1.5  --   --  1.9  --    < > = values in this interval not displayed.   Recent Labs    09/06/20 1145 09/09/20 1102 03/11/21 1151  AST 16 18 14   ALT 10 10 13   ALKPHOS 52 44  --   BILITOT 1.1 0.9 0.4  PROT 7.2 6.0* 6.8  ALBUMIN 3.8 3.1*  --    Recent Labs    09/09/20 1102 09/19/20 0826 03/11/21 1151  WBC 6.1 14.7* 5.9  NEUTROABS 2.3 9.5* 3,275  HGB 11.0* 12.7 9.8*  HCT 35.6* 40.4 29.9*  MCV 89.4 87.8 87.2  PLT 358 382 411*   Lab Results  Component Value Date   TSH 2.64 03/11/2021   Lab Results  Component Value Date   HGBA1C 5.3 03/29/2019   Lab Results  Component Value Date   CHOL 179 03/11/2021   HDL 69 03/11/2021   LDLCALC 91 03/11/2021   TRIG 94 03/11/2021   CHOLHDL 2.6 03/11/2021    Significant Diagnostic Results in last 30 days:  No results found.  Assessment/Plan 1. Chronic bilateral low back pain with bilateral sciatica Pain  controlled.No side effects reported.Has had no recent fall episode.  Continue on current pain regimen  - oxyCODONE (ROXICODONE) 15 MG immediate release tablet; Take 1 tablet (15 mg total) by mouth every 8 (eight) hours as needed for pain.  Dispense: 90 tablet; Refill: 0  2. Chronic pain syndrome PMDR reviewed  Contract  up to date.  - oxyCODONE (ROXICODONE) 15 MG immediate release tablet; Take 1 tablet (15 mg total) by mouth every 8 (eight) hours as needed for pain.  Dispense: 90 tablet; Refill: 0  3. Fibromyalgia Chronic  Continue on current pain regimen  - oxyCODONE (ROXICODONE) 15 MG immediate release tablet; Take 1 tablet (15 mg total) by mouth every 8 (eight) hours as needed for pain.  Dispense: 90 tablet; Refill: 0  4. Chronic diastolic congestive heart failure ([place]) Reports no abrupt weight gain or other signs of fluid overload Misplaced her prescription for lasix filled 03/11/2021. Will send another script  - furosemide (LASIX) 40 MG tablet; Take 1 tablet (40 mg total) by mouth daily.  Dispense: 90 tablet; Refill: 1  5. Dark stool  Reports dark stool  Latest hgb 9.8 previous 12.7 recommended checking stool to rule out GI bleed but was with son in [place].she was advised to go to urgent care for stool to be checked but apparently did not go.States will go soon. - advised to discontinue ASA until stool has been checked for blood.    Family/ staff Communication: Reviewed plan of care with patient and son   Labs/tests ordered: None   Next Appointment: As needed if symptoms worsen or fail to improve   I connected with  Wolfgang Phoenix on 05/07/21 by a Telephone visit enabled telemedicine application and verified that I am speaking with the correct person using two identifiers.   I discussed the limitations of evaluation and management by telemedicine. The patient expressed understanding and agreed to proceed.  Spent 12 minutes of non-face to face with patient  >50% time spent counseling; reviewing medical record; tests; labs; and developing future plan of care.  Sandrea Hughs, NP

## 2021-05-21 DIAGNOSIS — M5442 Lumbago with sciatica, left side: Secondary | ICD-10-CM | POA: Diagnosis not present

## 2021-05-21 DIAGNOSIS — R29898 Other symptoms and signs involving the musculoskeletal system: Secondary | ICD-10-CM | POA: Diagnosis not present

## 2021-05-28 ENCOUNTER — Other Ambulatory Visit: Payer: Self-pay | Admitting: Family

## 2021-05-28 DIAGNOSIS — G43009 Migraine without aura, not intractable, without status migrainosus: Secondary | ICD-10-CM

## 2021-05-30 ENCOUNTER — Other Ambulatory Visit: Payer: Self-pay | Admitting: Family

## 2021-05-30 DIAGNOSIS — I5032 Chronic diastolic (congestive) heart failure: Secondary | ICD-10-CM

## 2021-06-07 ENCOUNTER — Other Ambulatory Visit: Payer: Self-pay | Admitting: *Deleted

## 2021-06-07 ENCOUNTER — Telehealth: Payer: Self-pay | Admitting: *Deleted

## 2021-06-07 DIAGNOSIS — G894 Chronic pain syndrome: Secondary | ICD-10-CM

## 2021-06-07 DIAGNOSIS — R32 Unspecified urinary incontinence: Secondary | ICD-10-CM

## 2021-06-07 DIAGNOSIS — M797 Fibromyalgia: Secondary | ICD-10-CM

## 2021-06-07 DIAGNOSIS — K219 Gastro-esophageal reflux disease without esophagitis: Secondary | ICD-10-CM

## 2021-06-07 DIAGNOSIS — G8929 Other chronic pain: Secondary | ICD-10-CM

## 2021-06-07 MED ORDER — FAMOTIDINE 10 MG PO TABS: 10.0000 mg | ORAL_TABLET | Freq: Two times a day (BID) | ORAL | 1 refills | Status: DC

## 2021-06-07 MED ORDER — MOISTURE BARRIER 0.44-20.6 % EX OINT: TOPICAL_OINTMENT | CUTANEOUS | 3 refills | Status: AC

## 2021-06-07 MED ORDER — OXYCODONE HCL 15 MG PO TABS: 15.0000 mg | ORAL_TABLET | Freq: Three times a day (TID) | ORAL | 0 refills | Status: DC | PRN

## 2021-06-07 MED ORDER — DEPEND ADJUSTABLE UNDERWEAR LG MISC: 1 refills | Status: DC

## 2021-06-07 NOTE — Telephone Encounter (Signed)
Patient called and stated that she wants [name] to call in 3 Rx's to [facility] for her.  Medline soothing Barrier Ointment (due to using Depends)  2.   Depends Size Large-Changes four times daily.   3.   Famotidine for her stomach. Stated that she       uses OTC but if she had a Rx it would be cheaper.    Pended Rx's and sent to [facility] for approval.

## 2021-06-07 NOTE — Telephone Encounter (Signed)
Orders signed.

## 2021-06-07 NOTE — Telephone Encounter (Signed)
Patient called requesting refill.  Epic LR: 05/07/2021 Contract Date: 03/11/2021 Pended Rx and sent to [facility] for approval.

## 2021-06-20 DIAGNOSIS — R29898 Other symptoms and signs involving the musculoskeletal system: Secondary | ICD-10-CM | POA: Diagnosis not present

## 2021-06-20 DIAGNOSIS — G63 Polyneuropathy in diseases classified elsewhere: Secondary | ICD-10-CM | POA: Diagnosis not present

## 2021-06-20 DIAGNOSIS — M5442 Lumbago with sciatica, left side: Secondary | ICD-10-CM | POA: Diagnosis not present

## 2021-07-08 ENCOUNTER — Other Ambulatory Visit: Payer: Self-pay | Admitting: *Deleted

## 2021-07-08 DIAGNOSIS — G8929 Other chronic pain: Secondary | ICD-10-CM

## 2021-07-08 DIAGNOSIS — S199XXA Unspecified injury of neck, initial encounter: Secondary | ICD-10-CM | POA: Diagnosis not present

## 2021-07-08 DIAGNOSIS — E869 Volume depletion, unspecified: Secondary | ICD-10-CM | POA: Diagnosis not present

## 2021-07-08 DIAGNOSIS — M5430 Sciatica, unspecified side: Secondary | ICD-10-CM | POA: Diagnosis not present

## 2021-07-08 DIAGNOSIS — Z043 Encounter for examination and observation following other accident: Secondary | ICD-10-CM | POA: Diagnosis not present

## 2021-07-08 DIAGNOSIS — I10 Essential (primary) hypertension: Secondary | ICD-10-CM | POA: Diagnosis not present

## 2021-07-08 DIAGNOSIS — F32A Depression, unspecified: Secondary | ICD-10-CM | POA: Diagnosis not present

## 2021-07-08 DIAGNOSIS — R296 Repeated falls: Secondary | ICD-10-CM | POA: Diagnosis not present

## 2021-07-08 DIAGNOSIS — W010XXA Fall on same level from slipping, tripping and stumbling without subsequent striking against object, initial encounter: Secondary | ICD-10-CM | POA: Diagnosis not present

## 2021-07-08 DIAGNOSIS — N179 Acute kidney failure, unspecified: Secondary | ICD-10-CM | POA: Diagnosis not present

## 2021-07-08 DIAGNOSIS — S0990XA Unspecified injury of head, initial encounter: Secondary | ICD-10-CM | POA: Diagnosis not present

## 2021-07-08 DIAGNOSIS — S0101XA Laceration without foreign body of scalp, initial encounter: Secondary | ICD-10-CM | POA: Diagnosis not present

## 2021-07-08 DIAGNOSIS — M5442 Lumbago with sciatica, left side: Secondary | ICD-10-CM

## 2021-07-08 DIAGNOSIS — M797 Fibromyalgia: Secondary | ICD-10-CM

## 2021-07-08 DIAGNOSIS — N289 Disorder of kidney and ureter, unspecified: Secondary | ICD-10-CM | POA: Diagnosis not present

## 2021-07-08 DIAGNOSIS — R55 Syncope and collapse: Secondary | ICD-10-CM | POA: Diagnosis not present

## 2021-07-08 DIAGNOSIS — G894 Chronic pain syndrome: Secondary | ICD-10-CM

## 2021-07-08 DIAGNOSIS — Z743 Need for continuous supervision: Secondary | ICD-10-CM | POA: Diagnosis not present

## 2021-07-08 DIAGNOSIS — S0181XA Laceration without foreign body of other part of head, initial encounter: Secondary | ICD-10-CM | POA: Diagnosis not present

## 2021-07-08 DIAGNOSIS — Y9389 Activity, other specified: Secondary | ICD-10-CM | POA: Diagnosis not present

## 2021-07-08 DIAGNOSIS — Y92008 Other place in unspecified non-institutional (private) residence as the place of occurrence of the external cause: Secondary | ICD-10-CM | POA: Diagnosis not present

## 2021-07-08 MED ORDER — OXYCODONE HCL 15 MG PO TABS: 15.0000 mg | ORAL_TABLET | Freq: Three times a day (TID) | ORAL | 0 refills | Status: DC | PRN

## 2021-07-08 NOTE — Telephone Encounter (Signed)
Patient requested refill.  Epic LR: 06/07/2021 Contract date: 03/11/2021 [facility]

## 2021-07-09 DIAGNOSIS — R7989 Other specified abnormal findings of blood chemistry: Secondary | ICD-10-CM | POA: Diagnosis not present

## 2021-07-09 DIAGNOSIS — D509 Iron deficiency anemia, unspecified: Secondary | ICD-10-CM | POA: Diagnosis not present

## 2021-07-09 DIAGNOSIS — Z043 Encounter for examination and observation following other accident: Secondary | ICD-10-CM | POA: Diagnosis not present

## 2021-07-09 DIAGNOSIS — M5430 Sciatica, unspecified side: Secondary | ICD-10-CM | POA: Diagnosis not present

## 2021-07-09 DIAGNOSIS — R296 Repeated falls: Secondary | ICD-10-CM | POA: Diagnosis not present

## 2021-07-09 DIAGNOSIS — S199XXA Unspecified injury of neck, initial encounter: Secondary | ICD-10-CM | POA: Diagnosis not present

## 2021-07-09 DIAGNOSIS — S0990XA Unspecified injury of head, initial encounter: Secondary | ICD-10-CM | POA: Diagnosis not present

## 2021-07-09 DIAGNOSIS — S0101XA Laceration without foreign body of scalp, initial encounter: Secondary | ICD-10-CM | POA: Diagnosis not present

## 2021-07-09 DIAGNOSIS — N179 Acute kidney failure, unspecified: Secondary | ICD-10-CM | POA: Diagnosis not present

## 2021-07-10 DIAGNOSIS — S0101XA Laceration without foreign body of scalp, initial encounter: Secondary | ICD-10-CM | POA: Diagnosis not present

## 2021-07-10 DIAGNOSIS — I509 Heart failure, unspecified: Secondary | ICD-10-CM | POA: Diagnosis not present

## 2021-07-10 DIAGNOSIS — I10 Essential (primary) hypertension: Secondary | ICD-10-CM | POA: Diagnosis not present

## 2021-07-10 DIAGNOSIS — R55 Syncope and collapse: Secondary | ICD-10-CM | POA: Diagnosis not present

## 2021-07-10 DIAGNOSIS — M797 Fibromyalgia: Secondary | ICD-10-CM | POA: Diagnosis not present

## 2021-07-10 DIAGNOSIS — Z872 Personal history of diseases of the skin and subcutaneous tissue: Secondary | ICD-10-CM | POA: Diagnosis not present

## 2021-07-10 DIAGNOSIS — Z8739 Personal history of other diseases of the musculoskeletal system and connective tissue: Secondary | ICD-10-CM | POA: Diagnosis not present

## 2021-07-10 DIAGNOSIS — F32A Depression, unspecified: Secondary | ICD-10-CM | POA: Diagnosis not present

## 2021-07-10 DIAGNOSIS — N179 Acute kidney failure, unspecified: Secondary | ICD-10-CM | POA: Diagnosis not present

## 2021-07-11 DIAGNOSIS — Z872 Personal history of diseases of the skin and subcutaneous tissue: Secondary | ICD-10-CM | POA: Diagnosis not present

## 2021-07-11 DIAGNOSIS — I10 Essential (primary) hypertension: Secondary | ICD-10-CM | POA: Diagnosis not present

## 2021-07-11 DIAGNOSIS — R55 Syncope and collapse: Secondary | ICD-10-CM | POA: Diagnosis not present

## 2021-07-11 DIAGNOSIS — Z8739 Personal history of other diseases of the musculoskeletal system and connective tissue: Secondary | ICD-10-CM | POA: Diagnosis not present

## 2021-07-11 DIAGNOSIS — F32A Depression, unspecified: Secondary | ICD-10-CM | POA: Diagnosis not present

## 2021-07-11 DIAGNOSIS — M797 Fibromyalgia: Secondary | ICD-10-CM | POA: Diagnosis not present

## 2021-07-11 DIAGNOSIS — N179 Acute kidney failure, unspecified: Secondary | ICD-10-CM | POA: Diagnosis not present

## 2021-07-11 DIAGNOSIS — S0101XA Laceration without foreign body of scalp, initial encounter: Secondary | ICD-10-CM | POA: Diagnosis not present

## 2021-07-16 DIAGNOSIS — I499 Cardiac arrhythmia, unspecified: Secondary | ICD-10-CM | POA: Diagnosis not present

## 2021-07-21 DIAGNOSIS — M5442 Lumbago with sciatica, left side: Secondary | ICD-10-CM | POA: Diagnosis not present

## 2021-07-21 DIAGNOSIS — R29898 Other symptoms and signs involving the musculoskeletal system: Secondary | ICD-10-CM | POA: Diagnosis not present

## 2021-07-21 DIAGNOSIS — G63 Polyneuropathy in diseases classified elsewhere: Secondary | ICD-10-CM | POA: Diagnosis not present

## 2021-07-31 ENCOUNTER — Other Ambulatory Visit: Payer: Self-pay | Admitting: Family

## 2021-07-31 DIAGNOSIS — G43009 Migraine without aura, not intractable, without status migrainosus: Secondary | ICD-10-CM

## 2021-08-09 ENCOUNTER — Other Ambulatory Visit: Payer: Self-pay | Admitting: *Deleted

## 2021-08-09 DIAGNOSIS — G894 Chronic pain syndrome: Secondary | ICD-10-CM

## 2021-08-09 DIAGNOSIS — M5442 Lumbago with sciatica, left side: Secondary | ICD-10-CM

## 2021-08-09 DIAGNOSIS — M797 Fibromyalgia: Secondary | ICD-10-CM

## 2021-08-09 DIAGNOSIS — G8929 Other chronic pain: Secondary | ICD-10-CM

## 2021-08-09 MED ORDER — OXYCODONE HCL 15 MG PO TABS: 15.0000 mg | ORAL_TABLET | Freq: Three times a day (TID) | ORAL | 0 refills | Status: DC | PRN

## 2021-08-09 NOTE — Telephone Encounter (Signed)
Patient requested refill.  Epic LR: 07-08-2021 Contract Date: 03/11/2021 Pended Rx and sent to [facility] for approval.

## 2021-08-21 DIAGNOSIS — G63 Polyneuropathy in diseases classified elsewhere: Secondary | ICD-10-CM | POA: Diagnosis not present

## 2021-08-21 DIAGNOSIS — R29898 Other symptoms and signs involving the musculoskeletal system: Secondary | ICD-10-CM | POA: Diagnosis not present

## 2021-08-21 DIAGNOSIS — M5442 Lumbago with sciatica, left side: Secondary | ICD-10-CM | POA: Diagnosis not present

## 2021-09-03 ENCOUNTER — Other Ambulatory Visit: Payer: Self-pay | Admitting: *Deleted

## 2021-09-03 DIAGNOSIS — F332 Major depressive disorder, recurrent severe without psychotic features: Secondary | ICD-10-CM

## 2021-09-03 MED ORDER — CITALOPRAM HYDROBROMIDE 20 MG PO TABS: 20.0000 mg | ORAL_TABLET | Freq: Every day | ORAL | 1 refills | Status: DC

## 2021-09-03 MED ORDER — AMITRIPTYLINE HCL 150 MG PO TABS: 150.0000 mg | ORAL_TABLET | Freq: Every day | ORAL | 1 refills | Status: DC

## 2021-09-03 MED ORDER — AMITRIPTYLINE HCL 10 MG PO TABS: 10.0000 mg | ORAL_TABLET | Freq: Every day | ORAL | 1 refills | Status: DC

## 2021-09-03 NOTE — Telephone Encounter (Signed)
Pharmacy requested refills.  ?Pended Rx's and sent to [facility] for approval.  ?

## 2021-09-06 ENCOUNTER — Other Ambulatory Visit: Payer: Self-pay

## 2021-09-06 DIAGNOSIS — G894 Chronic pain syndrome: Secondary | ICD-10-CM

## 2021-09-06 DIAGNOSIS — G8929 Other chronic pain: Secondary | ICD-10-CM

## 2021-09-06 DIAGNOSIS — M5442 Lumbago with sciatica, left side: Secondary | ICD-10-CM

## 2021-09-06 DIAGNOSIS — M797 Fibromyalgia: Secondary | ICD-10-CM

## 2021-09-06 MED ORDER — OXYCODONE HCL 15 MG PO TABS: 15.0000 mg | ORAL_TABLET | Freq: Three times a day (TID) | ORAL | 0 refills | Status: DC | PRN

## 2021-09-06 NOTE — Telephone Encounter (Signed)
Patient is coming to town tomorrow specifically to get her refill and asked that I stress the importance of this message being addressed today  ?

## 2021-09-06 NOTE — Telephone Encounter (Signed)
Medication refilled

## 2021-09-06 NOTE — Telephone Encounter (Signed)
RX last refilled on 08/09/2021 ? ?Treatment agreement on file from 02/2021 ? ? ?

## 2021-09-13 ENCOUNTER — Other Ambulatory Visit: Payer: Self-pay | Admitting: Family

## 2021-09-13 DIAGNOSIS — I10 Essential (primary) hypertension: Secondary | ICD-10-CM

## 2021-09-18 DIAGNOSIS — R29898 Other symptoms and signs involving the musculoskeletal system: Secondary | ICD-10-CM | POA: Diagnosis not present

## 2021-09-18 DIAGNOSIS — G63 Polyneuropathy in diseases classified elsewhere: Secondary | ICD-10-CM | POA: Diagnosis not present

## 2021-09-18 DIAGNOSIS — M5442 Lumbago with sciatica, left side: Secondary | ICD-10-CM | POA: Diagnosis not present

## 2021-09-21 ENCOUNTER — Other Ambulatory Visit: Payer: Self-pay | Admitting: Family

## 2021-09-21 DIAGNOSIS — I5032 Chronic diastolic (congestive) heart failure: Secondary | ICD-10-CM

## 2021-10-07 ENCOUNTER — Other Ambulatory Visit: Payer: Self-pay

## 2021-10-07 DIAGNOSIS — G894 Chronic pain syndrome: Secondary | ICD-10-CM

## 2021-10-07 DIAGNOSIS — M797 Fibromyalgia: Secondary | ICD-10-CM

## 2021-10-07 DIAGNOSIS — G8929 Other chronic pain: Secondary | ICD-10-CM

## 2021-10-07 MED ORDER — OXYCODONE HCL 15 MG PO TABS: 15.0000 mg | ORAL_TABLET | Freq: Three times a day (TID) | ORAL | 0 refills | Status: DC | PRN

## 2021-10-07 NOTE — Telephone Encounter (Signed)
Patient called wanting refill oxycodone 15 mg. Patient was scheduled for follow up visit for 4/14. I informed patient that she may have to be seen before medication is refilled. Medication pended and sent to [facility] [name], NP for approval/refusal. ?

## 2021-10-11 ENCOUNTER — Ambulatory Visit: Payer: Medicare HMO | Admitting: Family

## 2021-10-14 ENCOUNTER — Encounter: Payer: Medicare HMO | Admitting: Family

## 2021-10-15 NOTE — Progress Notes (Signed)
  This encounter was created in error - please disregard. No show 

## 2021-11-08 ENCOUNTER — Encounter: Payer: Self-pay | Admitting: Family

## 2021-11-08 ENCOUNTER — Ambulatory Visit (INDEPENDENT_AMBULATORY_CARE_PROVIDER_SITE_OTHER): Payer: Medicare HMO | Admitting: Family

## 2021-11-08 VITALS — BP 114/70 | HR 69 | Temp 96.0°F | Resp 18 | Ht 64.0 in | Wt 194.0 lb

## 2021-11-08 DIAGNOSIS — G43009 Migraine without aura, not intractable, without status migrainosus: Secondary | ICD-10-CM | POA: Diagnosis not present

## 2021-11-08 DIAGNOSIS — M5442 Lumbago with sciatica, left side: Secondary | ICD-10-CM | POA: Diagnosis not present

## 2021-11-08 DIAGNOSIS — Z1211 Encounter for screening for malignant neoplasm of colon: Secondary | ICD-10-CM | POA: Diagnosis not present

## 2021-11-08 DIAGNOSIS — K219 Gastro-esophageal reflux disease without esophagitis: Secondary | ICD-10-CM | POA: Diagnosis not present

## 2021-11-08 DIAGNOSIS — M5441 Lumbago with sciatica, right side: Secondary | ICD-10-CM

## 2021-11-08 DIAGNOSIS — R32 Unspecified urinary incontinence: Secondary | ICD-10-CM

## 2021-11-08 DIAGNOSIS — G894 Chronic pain syndrome: Secondary | ICD-10-CM | POA: Diagnosis not present

## 2021-11-08 DIAGNOSIS — E782 Mixed hyperlipidemia: Secondary | ICD-10-CM | POA: Diagnosis not present

## 2021-11-08 DIAGNOSIS — Z1231 Encounter for screening mammogram for malignant neoplasm of breast: Secondary | ICD-10-CM | POA: Diagnosis not present

## 2021-11-08 DIAGNOSIS — I5032 Chronic diastolic (congestive) heart failure: Secondary | ICD-10-CM | POA: Diagnosis not present

## 2021-11-08 DIAGNOSIS — F332 Major depressive disorder, recurrent severe without psychotic features: Secondary | ICD-10-CM

## 2021-11-08 DIAGNOSIS — I10 Essential (primary) hypertension: Secondary | ICD-10-CM

## 2021-11-08 DIAGNOSIS — G8929 Other chronic pain: Secondary | ICD-10-CM

## 2021-11-08 DIAGNOSIS — J302 Other seasonal allergic rhinitis: Secondary | ICD-10-CM

## 2021-11-08 DIAGNOSIS — M797 Fibromyalgia: Secondary | ICD-10-CM

## 2021-11-08 MED ORDER — FLUTICASONE PROPIONATE 50 MCG/ACT NA SUSP: 2.0000 | Freq: Every day | NASAL | 1 refills | Status: DC | PRN

## 2021-11-08 MED ORDER — POTASSIUM CHLORIDE ER 10 MEQ PO TBCR: 10.0000 meq | EXTENDED_RELEASE_TABLET | Freq: Every day | ORAL | 1 refills | Status: DC

## 2021-11-08 MED ORDER — AMITRIPTYLINE HCL 10 MG PO TABS: 10.0000 mg | ORAL_TABLET | Freq: Every day | ORAL | 1 refills | Status: DC

## 2021-11-08 MED ORDER — FUROSEMIDE 40 MG PO TABS: 40.0000 mg | ORAL_TABLET | Freq: Every day | ORAL | 1 refills | Status: DC

## 2021-11-08 MED ORDER — AMITRIPTYLINE HCL 150 MG PO TABS: 150.0000 mg | ORAL_TABLET | Freq: Every day | ORAL | 1 refills | Status: DC

## 2021-11-08 MED ORDER — SUMATRIPTAN SUCCINATE 25 MG PO TABS: ORAL_TABLET | ORAL | 1 refills | Status: DC

## 2021-11-08 MED ORDER — FAMOTIDINE 10 MG PO TABS: 10.0000 mg | ORAL_TABLET | Freq: Two times a day (BID) | ORAL | 1 refills | Status: DC

## 2021-11-08 MED ORDER — METOPROLOL TARTRATE 50 MG PO TABS: ORAL_TABLET | ORAL | 1 refills | Status: DC

## 2021-11-08 MED ORDER — OXYCODONE HCL 15 MG PO TABS: 15.0000 mg | ORAL_TABLET | Freq: Three times a day (TID) | ORAL | 0 refills | Status: DC | PRN

## 2021-11-08 MED ORDER — DEPEND ADJUSTABLE UNDERWEAR LG MISC: 1 refills | Status: AC

## 2021-11-08 MED ORDER — CITALOPRAM HYDROBROMIDE 20 MG PO TABS: 20.0000 mg | ORAL_TABLET | Freq: Every day | ORAL | 1 refills | Status: DC

## 2021-11-08 MED ORDER — CVS D3 50 MCG (2000 UT) PO CAPS: 2000.0000 [IU] | ORAL_CAPSULE | Freq: Every day | ORAL | 1 refills | Status: DC

## 2021-11-08 MED ORDER — VITAMIN B-12 500 MCG PO TABS: 500.0000 ug | ORAL_TABLET | Freq: Every day | ORAL | 0 refills | Status: DC

## 2021-11-08 NOTE — Progress Notes (Signed)
? ?Provider: Marlowe Sax FNP-C  ? ?Abbie Berling, Nelda Bucks, NP ? ?Patient Care Team: ?Michaeljohn Biss, Nelda Bucks, NP as PCP - General (Family Medicine) ? ?Extended Emergency Contact Information ?Primary Emergency Contact: Lewis,Derrick ? [place] ?Mobile Phone: [phone] ?Relation: Son ?Secondary Emergency Contact: Dutch,Lashawn ?Mobile Phone: [phone] ?Relation: Daughter ? ?Code Status:  Full Code  ?Goals of care: Advanced Directive information ? ?  11/08/2021  ?  8:45 AM  ?Advanced Directives  ?Does Patient Have a Medical Advance Directive? Yes  ?Type of Advance Directive Healthcare Power of Attorney  ?Does patient want to make changes to medical advance directive? No - Patient declined  ?Copy of [place] in Chart? No - copy requested  ?Would patient like information on creating a medical advance directive? No - Patient declined  ? ? ? ?Chief Complaint  ?Patient presents with  ? Medical Management of Chronic Issues  ?  Routine Visit.  ? Health Maintenance  ?  Discuss the need for Mammogram, Colonoscopy, and Urine Microalbumin.   ? Immunizations  ?  Discuss the need for 2nd Covid Booster.  ? Concern  ?  Moderate Fall Risk  ? ? ?HPI:  ?Pt is a 71 y.o. female seen today for medical management of chronic diseases.  Has some medical history of essential hypertension, Raynaud's syndrome, migraine, congestive heart failure, pulmonary hypertension, GERD, fibromyalgia, peripheral neuropathy, chronic low back pain, chronic pain syndrome, hyperlipidemia, anxiety and depression, wheelchair dependent among others. ? ?States Generalized pain rates 20 on scale of 10.oxycodone takes the edge off. ?Migraine Headache has been more often.has required Imitrex more often request more refill quantity.  ?Has gained weight but states eats salad most of the time through the week and fried chicken and chips on the weekend.Son prepares food. ?She denies any new acute issues this visit. ? ? ?Past Medical History:   ?Diagnosis Date  ? Acute kidney injury ([place])   ? Allergy   ? Anxiety   ? Arthritis   ? "99% of my body" (11/11/2016)  ? Asthma   ? Bursitis of both hips   ? Cataract   ? Cervical scoliosis   ? Chronic back pain   ? "all over my back" (11/11/2016)  ? Depressive disorder, not elsewhere classified   ? Enthesopathy of hip region   ? Environmental allergies   ? "I take Claritin qd; 365 days/year" (11/11/2016)  ? Essential hypertension, benign   ? Fibromyalgia   ? Frequent falls   ? GERD (gastroesophageal reflux disease)   ? Headache   ? "at least 1/wk; may last for 2 days or so" (11/11/2016)  ? History of blood transfusion 1985  ? w/hysterectomy  ? Lumbar stenosis   ? Migraine   ? "a few/month" (11/11/2016)  ? Muscle weakness (generalized)   ? Myalgia and myositis, unspecified   ? Osteoporosis   ? Other abnormal blood chemistry   ? Other malaise and fatigue   ? Raynaud's syndrome   ? Sciatic nerve pain, left   ? Sciatica   ? Unspecified vitamin D deficiency   ? ?Past Surgical History:  ?Procedure Laterality Date  ? ABDOMINAL HYSTERECTOMY  1985  ? [place]; 1979  ? IR KYPHO LUMBAR INC FX REDUCE BONE BX UNI/BIL CANNULATION INC/IMAGING  11/04/2019  ? SHOULDER OPEN ROTATOR CUFF REPAIR Left   ? ? ?No Known Allergies ? ?Allergies as of 11/08/2021   ?No Known Allergies ?  ? ?  ?Medication List  ?  ? ?  ?  Accurate as of Nov 08, 2021  8:59 AM. If you have any questions, ask your nurse or doctor.  ?  ?  ? ?  ? ?acetaminophen 500 MG tablet ?Commonly known as: TYLENOL ?Take 1,000 mg by mouth every evening. ?  ?amitriptyline 10 MG tablet ?Commonly known as: ELAVIL ?Take 1 tablet (10 mg total) by mouth daily at 12 noon. Along with 150mg  tablet totaling 160mg . ?  ?amitriptyline 150 MG tablet ?Commonly known as: ELAVIL ?Take 1 tablet (150 mg total) by mouth at bedtime. ?  ?ascorbic acid 500 MG tablet ?Commonly known as: VITAMIN C ?Take 1 tablet (500 mg total) by mouth 2 (two) times daily. ?  ?carboxymethylcellulose 0.5 %  Soln ?Commonly known as: REFRESH PLUS ?Place 2 drops into both eyes 2 (two) times daily as needed (dry eyes). ?  ?citalopram 20 MG tablet ?Commonly known as: CELEXA ?Take 1 tablet (20 mg total) by mouth daily. ?  ?CVS D3 50 MCG (2000 UT) Caps ?Generic drug: Cholecalciferol ?TAKE 1 CAPSULE BY MOUTH EVERY DAY ?  ?Depend Adjustable Underwear Lg Misc ?Use for Incontinence. Change four times daily. Size Large. Dx: R32 ?  ?famotidine 10 MG tablet ?Commonly known as: PEPCID ?Take 1 tablet (10 mg total) by mouth 2 (two) times daily. ?  ?fluticasone 50 MCG/ACT nasal spray ?Commonly known as: FLONASE ?Place 2 sprays into both nostrils daily as needed for allergies or rhinitis. ?  ?furosemide 40 MG tablet ?Commonly known as: LASIX ?Take 1 tablet (40 mg total) by mouth daily. ?  ?lidocaine 5 % ?Commonly known as: Lidoderm ?Place 1 patch onto the skin daily. Remove & Discard patch within 12 hours or as directed by MD ?  ?loratadine 10 MG tablet ?Commonly known as: CLARITIN ?TAKE 1 TABLET BY MOUTH EVERY DAY ?  ?metoprolol tartrate 50 MG tablet ?Commonly known as: LOPRESSOR ?TAKE ONE TABLET BY MOUTH TWICE DAILY FOR BLOOD PRESSURE ?  ?Moisture Barrier 0.44-20.6 % Oint ?Generic drug: Menthol-Zinc Oxide ?Apply to skin when changing Depends four times daily for moisture protection ?  ?oxyCODONE 15 MG immediate release tablet ?Commonly known as: ROXICODONE ?Take 1 tablet (15 mg total) by mouth every 8 (eight) hours as needed for pain. ?  ?potassium chloride 10 MEQ tablet ?Commonly known as: KLOR-CON ?TAKE 1 TABLET BY MOUTH EVERY DAY ?  ?SUMAtriptan 25 MG tablet ?Commonly known as: IMITREX ?TAKE 1 TABLET BY MOUTH AT ONSET OF MIGRINE HEADACHE. MAY REPEAT IN 2 HOURS IF HEADACHE PERSISTS OR RECURS. ?  ?vitamin B-12 500 MCG tablet ?Commonly known as: CYANOCOBALAMIN ?Take 1 tablet (500 mcg total) by mouth daily. ?  ? ?  ? ? ?Review of Systems  ?Constitutional:  Negative for appetite change, chills, fatigue, fever and unexpected weight  change.  ?HENT:  Negative for congestion, dental problem, ear discharge, ear pain, facial swelling, hearing loss, nosebleeds, postnasal drip, rhinorrhea, sinus pressure, sinus pain, sneezing, sore throat, tinnitus and trouble swallowing.   ?Eyes:  Positive for visual disturbance. Negative for pain, discharge, redness and itching.  ?     Eye glasses   ?Respiratory:  Negative for cough, chest tightness, shortness of breath and wheezing.   ?Cardiovascular:  Negative for chest pain, palpitations and leg swelling.  ?Gastrointestinal:  Negative for abdominal distention, abdominal pain, blood in stool, constipation, diarrhea, nausea and vomiting.  ?Endocrine: Negative for cold intolerance, heat intolerance, polydipsia, polyphagia and polyuria.  ?Genitourinary:  Negative for difficulty urinating, dysuria, flank pain, frequency and urgency.  ?Musculoskeletal:  Positive for arthralgias,  back pain and gait problem. Negative for joint swelling, myalgias, neck pain and neck stiffness.  ?Skin:  Negative for color change, pallor, rash and wound.  ?Neurological:  Negative for dizziness, syncope, speech difficulty, weakness, light-headedness, numbness and headaches.  ?Hematological:  Does not bruise/bleed easily.  ?Psychiatric/Behavioral:  Negative for agitation, behavioral problems, confusion, hallucinations, self-injury, sleep disturbance and suicidal ideas. The patient is not nervous/anxious.   ? ?Immunization History  ?Administered Date(s) Administered  ? Fluad Quad(high Dose 65+) 03/29/2019  ? Influenza,inj,Quad PF,6+ Mos 05/23/2015, 05/28/2016, 03/24/2018  ? Influenza-Unspecified 07/07/2012, 02/28/2014  ? PFIZER(Purple Top)SARS-COV-2 Vaccination 10/03/2019  ? Pneumococcal Conjugate-13 07/14/2014  ? Pneumococcal Polysaccharide-23 09/11/2017, 11/03/2019  ? Td 01/26/2016  ? Tdap 01/26/2016  ? ?Pertinent  Health Maintenance Due  ?Topic Date Due  ? URINE MICROALBUMIN  Never done  ? COLONOSCOPY (Pts 45-62yrs Insurance coverage will  need to be confirmed)  Never done  ? MAMMOGRAM  09/04/2019  ? DEXA SCAN  Completed  ? INFLUENZA VACCINE  Discontinued  ? ? ?  01/31/2021  ?  3:45 PM 03/08/2021  ?  5:13 PM 03/11/2021  ? 10:15 AM 05/07/2021  ?  2:15 PM 5/

## 2021-11-09 LAB — LIPID PANEL
Cholesterol: 182 mg/dL (ref <200)
HDL: 88 mg/dL (ref >=50)
LDL Cholesterol (Calc): 81 mg/dL (calc)
Non-HDL Cholesterol (Calc): 94 mg/dL (calc) (ref <130)
Total CHOL/HDL Ratio: 2.1 (calc) (ref <5.0)
Triglycerides: 52 mg/dL (ref <150)

## 2021-11-09 LAB — COMPLETE METABOLIC PANEL WITH GFR
AG Ratio: 2.2 (calc) (ref 1.0–2.5)
ALT: 7 U/L (ref 6–29)
AST: 11 U/L (ref 10–35)
Albumin: 4.9 g/dL (ref 3.6–5.1)
Alkaline phosphatase (APISO): 55 U/L (ref 37–153)
BUN/Creatinine Ratio: 15 (calc) (ref 6–22)
BUN: 19 mg/dL (ref 7–25)
CO2: 27 mmol/L (ref 20–32)
Calcium: 9.7 mg/dL (ref 8.6–10.4)
Chloride: 102 mmol/L (ref 98–110)
Creat: 1.23 mg/dL — ABNORMAL HIGH (ref 0.60–1.00)
Globulin: 2.2 g/dL (calc) (ref 1.9–3.7)
Glucose, Bld: 85 mg/dL (ref 65–139)
Potassium: 4.7 mmol/L (ref 3.5–5.3)
Sodium: 138 mmol/L (ref 135–146)
Total Bilirubin: 0.9 mg/dL (ref 0.2–1.2)
Total Protein: 7.1 g/dL (ref 6.1–8.1)
eGFR: 47 mL/min/{1.73_m2} — ABNORMAL LOW (ref >=60)

## 2021-11-09 LAB — CBC WITH DIFFERENTIAL/PLATELET
Absolute Monocytes: 873 cells/uL (ref 200–950)
Basophils Absolute: 57 cells/uL (ref 0–200)
Basophils Relative: 1.4 %
Eosinophils Absolute: 119 cells/uL (ref 15–500)
Eosinophils Relative: 2.9 %
HCT: 33.4 % — ABNORMAL LOW (ref 35.0–45.0)
Hemoglobin: 11 g/dL — ABNORMAL LOW (ref 11.7–15.5)
Lymphs Abs: 1349 cells/uL (ref 850–3900)
MCH: 27.8 pg (ref 27.0–33.0)
MCHC: 32.9 g/dL (ref 32.0–36.0)
MCV: 84.6 fL (ref 80.0–100.0)
MPV: 10.8 fL (ref 7.5–12.5)
Monocytes Relative: 21.3 %
Neutro Abs: 1702 cells/uL (ref 1500–7800)
Neutrophils Relative %: 41.5 %
Platelets: 311 10*3/uL (ref 140–400)
RBC: 3.95 10*6/uL (ref 3.80–5.10)
RDW: 11.9 % (ref 11.0–15.0)
Total Lymphocyte: 32.9 %
WBC: 4.1 10*3/uL (ref 3.8–10.8)

## 2021-11-09 LAB — TSH: TSH: 1.81 mIU/L (ref 0.40–4.50)

## 2021-11-11 LAB — DRUG MONITOR, PANEL 1, W/CONF, URINE
Amphetamines: NEGATIVE ng/mL (ref <500)
Barbiturates: NEGATIVE ng/mL (ref <300)
Benzodiazepines: NEGATIVE ng/mL (ref <100)
Cocaine Metabolite: NEGATIVE ng/mL (ref <150)
Codeine: NEGATIVE ng/mL (ref <50)
Creatinine: 176.5 mg/dL (ref >=20.0)
Hydrocodone: NEGATIVE ng/mL (ref <50)
Hydromorphone: NEGATIVE ng/mL (ref <50)
Marijuana Metabolite: NEGATIVE ng/mL (ref <20)
Methadone Metabolite: NEGATIVE ng/mL (ref <100)
Morphine: NEGATIVE ng/mL (ref <50)
Norhydrocodone: NEGATIVE ng/mL (ref <50)
Noroxycodone: 6245 ng/mL — ABNORMAL HIGH (ref <50)
Opiates: NEGATIVE ng/mL (ref <100)
Oxidant: NEGATIVE ug/mL (ref <200)
Oxycodone: 898 ng/mL — ABNORMAL HIGH (ref <50)
Oxycodone: POSITIVE ng/mL — AB (ref <100)
Oxymorphone: 747 ng/mL — ABNORMAL HIGH (ref <50)
Phencyclidine: NEGATIVE ng/mL (ref <25)
pH: 8 (ref 4.5–9.0)

## 2021-11-11 LAB — DM TEMPLATE

## 2021-12-13 ENCOUNTER — Other Ambulatory Visit: Payer: Self-pay | Admitting: *Deleted

## 2021-12-13 DIAGNOSIS — M797 Fibromyalgia: Secondary | ICD-10-CM

## 2021-12-13 DIAGNOSIS — G894 Chronic pain syndrome: Secondary | ICD-10-CM

## 2021-12-13 DIAGNOSIS — G8929 Other chronic pain: Secondary | ICD-10-CM

## 2021-12-13 MED ORDER — OXYCODONE HCL 15 MG PO TABS: 15.0000 mg | ORAL_TABLET | Freq: Three times a day (TID) | ORAL | 0 refills | Status: DC | PRN

## 2021-12-13 NOTE — Telephone Encounter (Signed)
Patient requested refill.  Epic LR: 11/08/2021 Contract Date: 03/11/2021 Pended Rx and sent to [facility] for approval due to [name] out of office.

## 2021-12-20 ENCOUNTER — Telehealth: Payer: Self-pay

## 2021-12-20 ENCOUNTER — Encounter: Payer: Self-pay | Admitting: *Deleted

## 2021-12-20 DIAGNOSIS — M797 Fibromyalgia: Secondary | ICD-10-CM

## 2021-12-20 DIAGNOSIS — G894 Chronic pain syndrome: Secondary | ICD-10-CM

## 2021-12-20 DIAGNOSIS — G8929 Other chronic pain: Secondary | ICD-10-CM

## 2021-12-20 MED ORDER — OXYCODONE HCL 15 MG PO TABS
15.0000 mg | ORAL_TABLET | Freq: Three times a day (TID) | ORAL | 0 refills | Status: DC | PRN
Start: 2021-12-20 — End: 2022-01-16

## 2021-12-20 MED ORDER — OXYCODONE HCL 15 MG PO TABS: 15.0000 mg | ORAL_TABLET | Freq: Three times a day (TID) | ORAL | 0 refills | Status: DC | PRN

## 2021-12-20 NOTE — Telephone Encounter (Signed)
Will send prescription to [facility].Notify [facility]in Golden to cancel prescription.

## 2021-12-20 NOTE — Telephone Encounter (Signed)
Error

## 2021-12-20 NOTE — Telephone Encounter (Signed)
Patient called and stated that the pharmacy did not receive her Rx.   I called the Pharmacy [facility] [phone] and spoke with the pharmacist. She stated that she has not received patient's Rx and ask that we resend the Rx to them. Stated that they have been having fax issues.   Pended Rx again and sent to [facility] for approval.

## 2021-12-20 NOTE — Telephone Encounter (Signed)
Incoming call received from patient stating [facility] [address] [facility] never received rx that we were suppose to send on 6/16/203.  I [facility] on a conference call to allow the patient to overhear the conversation and it was confirmed that rx was received, however the pharmacist stated they do not have the oxycodone in stock and this is across all [facility].  The pharmacist stated he will delete rx to allow [place] to send to another pharmacy chain that may have it in stock.  Conference call was ended with the pharmacist and I informed patient that we had a similar situation yesterday and another patient was able to get rx at [facility] on [address].   Patient asked if I could have [name] to send to [facility] and she will inform her son to pick it up there.

## 2021-12-30 ENCOUNTER — Other Ambulatory Visit: Payer: Self-pay | Admitting: Family

## 2021-12-30 DIAGNOSIS — I5032 Chronic diastolic (congestive) heart failure: Secondary | ICD-10-CM

## 2021-12-30 DIAGNOSIS — E538 Deficiency of other specified B group vitamins: Secondary | ICD-10-CM

## 2021-12-30 MED ORDER — VITAMIN B-12 500 MCG PO TABS: 500.0000 ug | ORAL_TABLET | Freq: Every day | ORAL | 0 refills | Status: DC

## 2021-12-30 NOTE — Telephone Encounter (Signed)
Last office visit 11/08/2021  Script sent in

## 2022-01-10 ENCOUNTER — Telehealth (INDEPENDENT_AMBULATORY_CARE_PROVIDER_SITE_OTHER): Payer: Medicare HMO | Admitting: Family

## 2022-01-10 ENCOUNTER — Encounter: Payer: Self-pay | Admitting: Family

## 2022-01-10 ENCOUNTER — Telehealth: Payer: Self-pay

## 2022-01-10 VITALS — Ht 64.0 in | Wt 190.0 lb

## 2022-01-10 DIAGNOSIS — G43009 Migraine without aura, not intractable, without status migrainosus: Secondary | ICD-10-CM | POA: Diagnosis not present

## 2022-01-10 DIAGNOSIS — R600 Localized edema: Secondary | ICD-10-CM | POA: Diagnosis not present

## 2022-01-10 MED ORDER — SUMATRIPTAN SUCCINATE 25 MG PO TABS
ORAL_TABLET | ORAL | 1 refills | Status: DC
Start: 2022-01-10 — End: 2022-06-09

## 2022-01-10 NOTE — Telephone Encounter (Signed)
I connected with  Melinda [name] on 01/10/22 by a video enabled telemedicine application and verified that I am speaking with the correct person using two identifiers.   I discussed the limitations of evaluation and management by telemedicine. The patient expressed understanding and agreed to proceed.

## 2022-01-10 NOTE — Progress Notes (Signed)
This service is provided via telemedicine  No vital signs collected/recorded due to the encounter was a telemedicine visit.   Location of patient (ex: home, work):  home  Patient consents to a telephone visit:  yes  Location of the provider (ex: office, home):  [facility]  Name of any referring provider:  N/A  Names of all persons participating in the telemedicine service and their role in the encounter:  patient, Emory Leaver, Nelda Bucks, NP, [place]  Time spent on call:  30min   Provider: Addison Freimuth FNP-C  Brytni Dray, Nelda Bucks, NP  Patient Care Team: Tiernan Suto, Nelda Bucks, NP as PCP - General (Family Medicine)  Extended Emergency Contact Information Primary Emergency Contact: [place] Phone: [phone] Relation: Son Secondary Emergency Contact: Dutch,Lashawn Mobile Phone: [phone] Relation: Daughter  Code Status:  Full Code  Goals of care: Advanced Directive information    11/08/2021    8:45 AM  Advanced Directives  Does Patient Have a Medical Advance Directive? Yes  Type of Advance Directive [place]  Does patient want to make changes to medical advance directive? No - Patient declined  Copy of [place] in Chart? No - copy requested  Would patient like information on creating a medical advance directive? No - Patient declined     Chief Complaint  Patient presents with   Acute Visit    Video visit patient seen for foot swelling and headaches    HPI:  Pt is a 71 y.o. female seen today for an acute visit for evaluation of foot swelling  and headaches Has been wearing  hospital socks. Edema has worsen.Has been taking Furosemide 40 mg tablet daily as directed.She denies any fever,chills,cough,fatigue,body aches,runny nose,chest tightness,chest pain,palpitation or shortness of breath.  [facility]  patient  declined video visit.    Past Medical History:  Diagnosis Date   Acute kidney injury ([place])    Allergy    Anxiety    Arthritis    "99% of my body" (11/11/2016)   Asthma    Bursitis of both hips    Cataract    Cervical scoliosis    Chronic back pain    "all over my back" (11/11/2016)   Depressive disorder, not elsewhere classified    Enthesopathy of hip region    Environmental allergies    "I take Claritin qd; 365 days/year" (11/11/2016)   Essential hypertension, benign    Fibromyalgia    Frequent falls    GERD (gastroesophageal reflux disease)    Headache    "at least 1/wk; may last for 2 days or so" (11/11/2016)   History of blood transfusion 1985   w/hysterectomy   Lumbar stenosis    Migraine    "a few/month" (11/11/2016)   Muscle weakness (generalized)    Myalgia and myositis, unspecified    Osteoporosis    Other abnormal blood chemistry    Other malaise and fatigue    Raynaud's syndrome    Sciatic nerve pain, left    Sciatica    Unspecified vitamin D deficiency    Past Surgical History:  Procedure Laterality Date   [place]; 1979   IR [place] FX REDUCE BONE BX UNI/BIL CANNULATION INC/IMAGING  11/04/2019   SHOULDER OPEN ROTATOR CUFF REPAIR Left     No Known Allergies  Outpatient Encounter Medications as of  01/10/2022  Medication Sig   acetaminophen (TYLENOL) 500 MG tablet Take 1,000 mg by mouth every evening.    amitriptyline (ELAVIL) 10 MG tablet Take 1 tablet (10 mg total) by mouth daily at 12 noon. Along with 150mg  tablet totaling 160mg .   amitriptyline (ELAVIL) 150 MG tablet Take 1 tablet (150 mg total) by mouth at bedtime.   ascorbic acid (VITAMIN C) 500 MG tablet Take 1 tablet (500 mg total) by mouth 2 (two) times daily.   carboxymethylcellulose (REFRESH PLUS) 0.5 % SOLN Place 2 drops into both eyes 2 (two) times daily as needed (dry eyes).    Cholecalciferol ([facility] D3) 50 MCG (2000 UT) CAPS Take 1 capsule  (2,000 Units total) by mouth daily.   citalopram (CELEXA) 20 MG tablet Take 1 tablet (20 mg total) by mouth daily.   famotidine (PEPCID) 10 MG tablet Take 1 tablet (10 mg total) by mouth 2 (two) times daily.   fluticasone (FLONASE) 50 MCG/ACT nasal spray Place 2 sprays into both nostrils daily as needed for allergies or rhinitis.   furosemide (LASIX) 40 MG tablet Take 1 tablet (40 mg total) by mouth daily.   Incontinence Supply Disposable (DEPEND ADJUSTABLE UNDERWEAR LG) MISC Use for Incontinence. Change four times daily. Size Large. Dx: R32   lidocaine (LIDODERM) 5 % Place 1 patch onto the skin daily. Remove & Discard patch within 12 hours or as directed by MD   loratadine (CLARITIN) 10 MG tablet TAKE 1 TABLET BY MOUTH EVERY DAY   Menthol-Zinc Oxide (MOISTURE BARRIER) 0.44-20.6 % OINT Apply to skin when changing Depends four times daily for moisture protection   metoprolol tartrate (LOPRESSOR) 50 MG tablet TAKE ONE TABLET BY MOUTH TWICE DAILY FOR BLOOD PRESSURE   oxyCODONE (ROXICODONE) 15 MG immediate release tablet Take 1 tablet (15 mg total) by mouth every 8 (eight) hours as needed for pain.   potassium chloride (KLOR-CON) 10 MEQ tablet Take 1 tablet (10 mEq total) by mouth daily.   SUMAtriptan (IMITREX) 25 MG tablet May repeat in 2 hours if headache persists or recurs.   vitamin B-12 (CYANOCOBALAMIN) 500 MCG tablet Take 1 tablet (500 mcg total) by mouth daily.   No facility-administered encounter medications on file as of 01/10/2022.    Review of Systems  Constitutional:  Negative for chills, fatigue and fever.  Respiratory:  Negative for cough, chest tightness, shortness of breath and wheezing.   Cardiovascular:  Positive for leg swelling. Negative for chest pain and palpitations.  Gastrointestinal:  Negative for abdominal distention, abdominal pain, nausea and vomiting.  Genitourinary:  Negative for difficulty urinating, dysuria and urgency.       Incontinent     Immunization History   Administered Date(s) Administered   Fluad Quad(high Dose 65+) 03/29/2019   Influenza,inj,Quad PF,6+ Mos 05/23/2015, 05/28/2016, 03/24/2018   Influenza-Unspecified 07/07/2012, 02/28/2014   PFIZER(Purple Top)SARS-COV-2 Vaccination 10/03/2019   Pneumococcal Conjugate-13 07/14/2014   Pneumococcal Polysaccharide-23 09/11/2017, 11/03/2019   Td 01/26/2016   Tdap 01/26/2016   Pertinent  Health Maintenance Due  Topic Date Due   URINE MICROALBUMIN  Never done   COLONOSCOPY (Pts 45-25yrs Insurance coverage will need to be confirmed)  Never done   MAMMOGRAM  09/04/2019   DEXA SCAN  Completed   INFLUENZA VACCINE  Discontinued      01/31/2021    3:45 PM 03/08/2021    5:13 PM 03/11/2021   10:15 AM 05/07/2021    2:15 PM 11/08/2021    8:45 AM  Fall Risk  Falls in  the past year? 0 0 0 0 1  Was there an injury with Fall? 0 0 0 0 0  Fall Risk Category Calculator 0 0 0 0 2  Fall Risk Category Low Low Low Low Moderate  Patient Fall Risk Level Low fall risk Low fall risk Low fall risk Low fall risk Moderate fall risk  Patient at Risk for Falls Due to No Fall Risks History of fall(s) History of fall(s) No Fall Risks History of fall(s)  Fall risk Follow up Falls evaluation completed Falls evaluation completed Falls evaluation completed Falls evaluation completed Falls evaluation completed;Education provided;Falls prevention discussed   Functional Status Survey:    Vitals:   01/10/22 1528  Weight: 190 lb (86.2 kg)  Height: 5\' 4"  (1.626 m)   Body mass index is 32.61 kg/m. Physical Exam Unable to complete on telephone.patient declined video visit.   Labs reviewed: Recent Labs    03/11/21 1151 11/08/21 1025  NA 139 138  K 4.1 4.7  CL 106 102  CO2 22 27  GLUCOSE 86 85  BUN 48* 19  CREATININE 0.91 1.23*  CALCIUM 10.0 9.7   Recent Labs    03/11/21 1151 11/08/21 1025  AST 14 11  ALT 13 7  BILITOT 0.4 0.9  PROT 6.8 7.1   Recent Labs    03/11/21 1151 11/08/21 1025  WBC 5.9 4.1   NEUTROABS 3,275 1,702  HGB 9.8* 11.0*  HCT 29.9* 33.4*  MCV 87.2 84.6  PLT 411* 311   Lab Results  Component Value Date   TSH 1.81 11/08/2021   Lab Results  Component Value Date   HGBA1C 5.3 03/29/2019   Lab Results  Component Value Date   CHOL 182 11/08/2021   HDL 88 11/08/2021   LDLCALC 81 11/08/2021   TRIG 52 11/08/2021   CHOLHDL 2.1 11/08/2021    Significant Diagnostic Results in last 30 days:  No results found.  Assessment/Plan 1. Migraine without aura and without status migrainosus, not intractable Intermittent [facility]. - SUMAtriptan (IMITREX) 25 MG tablet; May repeat in 2 hours if headache persists or recurs.  Dispense: 60 tablet; Refill: 1  2. Edema of both lower extremities Reports edema has worsened she denies any signs of fluid overload. -Advised to keep legs elevated whenever she is seated -To wear knee-high compression stockings on in the morning and off at bedtime.  Request prescription to be sent to mail [facility] pharmacy.. -Advised to take furosemide 40 mg tablet 1 by mouth twice daily in the morning and at 2 PM x5 days along with potassium supplement then resume furosemide 40 mg tablet 1 by mouth daily -Advised to notify provider if symptoms worsen -Cut down on salt intake in diet - Compression stockings  Family/ staff Communication: Reviewed plan of care with patient verbalized understanding  Labs/tests ordered: None   Next Appointment: Return if symptoms worsen or fail to improve.  I connected with  Vanilla Heatherington on 01/10/22 by a telephone enabled telemedicine application and verified that I am speaking with the correct person using two identifiers.   I discussed the limitations of evaluation and management by telemedicine. The patient expressed understanding and agreed to proceed.  Spent 13 minutes of non-face to face with patient  >50% time spent counseling; reviewing medical  record;labs; and developing future plan of care.    01/12/22, NP

## 2022-01-10 NOTE — Patient Instructions (Signed)
Take extra Furosemide 40 mg tablet one by mouth at 2 pm

## 2022-01-15 ENCOUNTER — Telehealth: Payer: Self-pay

## 2022-01-15 NOTE — Telephone Encounter (Signed)
Rx last refilled on 12/20/2021, per patient Melinda [name] said she would fill 2-3 days prior to due date.   Treatment agreement on file dated 03/11/2021

## 2022-01-15 NOTE — Telephone Encounter (Signed)
Patient called and left message on clinical intake voicemail and stated that [facility] [place].   Tried calling patient back and it rings busy.

## 2022-01-16 ENCOUNTER — Other Ambulatory Visit: Payer: Self-pay | Admitting: Family

## 2022-01-16 DIAGNOSIS — G8929 Other chronic pain: Secondary | ICD-10-CM

## 2022-01-16 DIAGNOSIS — M797 Fibromyalgia: Secondary | ICD-10-CM

## 2022-01-16 DIAGNOSIS — G894 Chronic pain syndrome: Secondary | ICD-10-CM

## 2022-01-16 MED ORDER — OXYCODONE HCL 15 MG PO TABS: 15.0000 mg | ORAL_TABLET | Freq: Three times a day (TID) | ORAL | 0 refills | Status: DC | PRN

## 2022-01-16 NOTE — Telephone Encounter (Signed)
[facility]

## 2022-01-16 NOTE — Progress Notes (Signed)
Oxycodone script send to pharmacy.

## 2022-01-18 ENCOUNTER — Other Ambulatory Visit: Payer: Self-pay | Admitting: Family

## 2022-01-18 DIAGNOSIS — I5032 Chronic diastolic (congestive) heart failure: Secondary | ICD-10-CM

## 2022-02-01 ENCOUNTER — Other Ambulatory Visit: Payer: Self-pay | Admitting: Family

## 2022-02-03 NOTE — Telephone Encounter (Signed)
Patient needs refill on medication Amitriptyline 10mg . Please edit patient signature. It states to take along with 150mg . But it doesn't specify which medication that is. Message sent , NP. Due to PCP [name], , NP being out of office.

## 2022-02-07 ENCOUNTER — Other Ambulatory Visit: Payer: Self-pay | Admitting: Family

## 2022-02-07 DIAGNOSIS — E538 Deficiency of other specified B group vitamins: Secondary | ICD-10-CM

## 2022-02-10 ENCOUNTER — Other Ambulatory Visit: Payer: Self-pay | Admitting: *Deleted

## 2022-02-10 DIAGNOSIS — G8929 Other chronic pain: Secondary | ICD-10-CM

## 2022-02-10 DIAGNOSIS — M797 Fibromyalgia: Secondary | ICD-10-CM

## 2022-02-10 DIAGNOSIS — G894 Chronic pain syndrome: Secondary | ICD-10-CM

## 2022-02-10 MED ORDER — OXYCODONE HCL 15 MG PO TABS: 15.0000 mg | ORAL_TABLET | Freq: Three times a day (TID) | ORAL | 0 refills | Status: DC | PRN

## 2022-02-10 NOTE — Telephone Encounter (Signed)
Patient called and stated that her son is in town and would like to pick up her Rx before heading back tomorrow.   Epic LR: 01/16/2022 Contract Date: 03/11/2021  Pended Rx and sent to [facility] for approval.

## 2022-03-17 ENCOUNTER — Telehealth: Payer: Self-pay

## 2022-03-17 DIAGNOSIS — G8929 Other chronic pain: Secondary | ICD-10-CM

## 2022-03-17 DIAGNOSIS — G894 Chronic pain syndrome: Secondary | ICD-10-CM

## 2022-03-17 DIAGNOSIS — M797 Fibromyalgia: Secondary | ICD-10-CM

## 2022-03-17 NOTE — Telephone Encounter (Signed)
Patient called and stated that she will need her pain medication. She wants to pick it up October 16th. She said she was told to call so that it can be ready.  Message routed to Marlowe Sax, NP

## 2022-03-17 NOTE — Telephone Encounter (Signed)
Refills due October or September ?

## 2022-03-18 NOTE — Telephone Encounter (Signed)
Please call for script refill 4-5 days of due date.

## 2022-03-18 NOTE — Telephone Encounter (Signed)
Patient's refills are due in September,but she wants to have it filled in October closer to her appointment so that she will only have to make one trip to [place].

## 2022-03-21 MED ORDER — OXYCODONE HCL 15 MG PO TABS: 15.0000 mg | ORAL_TABLET | Freq: Three times a day (TID) | ORAL | 0 refills | Status: DC | PRN

## 2022-03-21 NOTE — Telephone Encounter (Signed)
Patient requested refill Epic LR: 02/10/2022 Contract Date: 03/11/2021 Note added to upcoming appointment in October to update.    Pended Rx and sent to [facility] for approval due to Webb Silversmith out of office.

## 2022-03-21 NOTE — Telephone Encounter (Signed)
Pt states she takes 3 oxycodone a day and needs a refill. Pt did not want to be talk to clinical intake and requested I let you know she [facility] [address] Last written 02/10/22 quantity 30 tablets.

## 2022-03-21 NOTE — Telephone Encounter (Signed)
Patient called to see if rx was approved. Patient informed rx sent to pharmacy as requested

## 2022-03-27 ENCOUNTER — Ambulatory Visit: Payer: Self-pay

## 2022-03-27 NOTE — Patient Outreach (Signed)
  Care Coordination   03/27/2022 Name: Melinda [name] MRN: [id number] DOB: 04/19/1951   Care Coordination Outreach Attempts:  An unsuccessful telephone outreach was attempted today to offer the patient information about available care coordination services as a benefit of their health plan.   Follow Up Plan:  Additional outreach attempts will be made to offer the patient care coordination information and services.   Encounter Outcome:  Pt. Request to Call Back  Care Coordination Interventions Activated:  No   Care Coordination Interventions:  No, not indicated    Daneen Schick, BSW, CDP Social Worker, Certified Dementia Practitioner [place] [facility]  Care Coordination [phone]

## 2022-03-30 DIAGNOSIS — I252 Old myocardial infarction: Secondary | ICD-10-CM | POA: Diagnosis not present

## 2022-03-30 DIAGNOSIS — R0789 Other chest pain: Secondary | ICD-10-CM | POA: Diagnosis not present

## 2022-03-30 DIAGNOSIS — Z743 Need for continuous supervision: Secondary | ICD-10-CM | POA: Diagnosis not present

## 2022-03-30 DIAGNOSIS — I509 Heart failure, unspecified: Secondary | ICD-10-CM | POA: Diagnosis not present

## 2022-03-30 DIAGNOSIS — I110 Hypertensive heart disease with heart failure: Secondary | ICD-10-CM | POA: Diagnosis not present

## 2022-03-30 DIAGNOSIS — R918 Other nonspecific abnormal finding of lung field: Secondary | ICD-10-CM | POA: Diagnosis not present

## 2022-03-30 DIAGNOSIS — R079 Chest pain, unspecified: Secondary | ICD-10-CM | POA: Diagnosis not present

## 2022-03-30 DIAGNOSIS — R9389 Abnormal findings on diagnostic imaging of other specified body structures: Secondary | ICD-10-CM | POA: Diagnosis not present

## 2022-03-30 DIAGNOSIS — R9431 Abnormal electrocardiogram [ECG] [EKG]: Secondary | ICD-10-CM | POA: Diagnosis not present

## 2022-04-02 ENCOUNTER — Other Ambulatory Visit: Payer: Self-pay | Admitting: Family

## 2022-04-02 DIAGNOSIS — K219 Gastro-esophageal reflux disease without esophagitis: Secondary | ICD-10-CM

## 2022-04-02 DIAGNOSIS — J302 Other seasonal allergic rhinitis: Secondary | ICD-10-CM

## 2022-04-02 DIAGNOSIS — F332 Major depressive disorder, recurrent severe without psychotic features: Secondary | ICD-10-CM

## 2022-04-03 ENCOUNTER — Other Ambulatory Visit: Payer: Self-pay | Admitting: Family

## 2022-04-03 NOTE — Telephone Encounter (Signed)
Patient has request refill on medications Citalopram, Famotidine, and Flonase. Patient medications have High Risk Warnings. All medications pend and sent to PCP [name], Nelda Bucks, NP for approval.

## 2022-04-04 ENCOUNTER — Other Ambulatory Visit: Payer: Self-pay

## 2022-04-04 DIAGNOSIS — I5032 Chronic diastolic (congestive) heart failure: Secondary | ICD-10-CM

## 2022-04-04 MED ORDER — POTASSIUM CHLORIDE ER 10 MEQ PO TBCR: 10.0000 meq | EXTENDED_RELEASE_TABLET | Freq: Every day | ORAL | 1 refills | Status: DC

## 2022-04-09 ENCOUNTER — Other Ambulatory Visit: Payer: Self-pay | Admitting: Family

## 2022-04-09 DIAGNOSIS — F332 Major depressive disorder, recurrent severe without psychotic features: Secondary | ICD-10-CM

## 2022-04-09 DIAGNOSIS — F1190 Opioid use, unspecified, uncomplicated: Secondary | ICD-10-CM

## 2022-04-09 NOTE — Telephone Encounter (Signed)
Forwarded message to Evie in Clinical Intake.  

## 2022-04-09 NOTE — Telephone Encounter (Signed)
Due to continuous use of oxycodone recommend adding Naloxone 1 mg/ml injection as needed for increased sedation

## 2022-04-09 NOTE — Telephone Encounter (Signed)
High risk or very high risk warning populated when attempting to refill medication. RX request sent to PCP for review and approval if warranted.   

## 2022-04-10 NOTE — Telephone Encounter (Signed)
Called and left message for patient to call office.

## 2022-04-10 NOTE — Telephone Encounter (Signed)
Patient returned call and LM on Clinical intake.   Tried calling patient back and LMOM to return call.

## 2022-04-14 ENCOUNTER — Ambulatory Visit: Payer: Medicare HMO | Admitting: Family

## 2022-04-14 MED ORDER — NALOXONE HCL 2 MG/2ML IJ SOSY: 1.0000 mg | PREFILLED_SYRINGE | INTRAMUSCULAR | 2 refills | Status: AC | PRN

## 2022-04-14 NOTE — Telephone Encounter (Signed)
Spoke with patient and discussed recommended need for Naloxone. Patient verbalized her understanding.   Medication pended and sent to Sherrie Mustache, NP(covering provider) for approval.

## 2022-04-18 ENCOUNTER — Telehealth: Payer: Self-pay

## 2022-04-18 ENCOUNTER — Other Ambulatory Visit: Payer: Self-pay

## 2022-04-18 DIAGNOSIS — G894 Chronic pain syndrome: Secondary | ICD-10-CM

## 2022-04-18 DIAGNOSIS — M797 Fibromyalgia: Secondary | ICD-10-CM

## 2022-04-18 DIAGNOSIS — G8929 Other chronic pain: Secondary | ICD-10-CM

## 2022-04-18 MED ORDER — OXYCODONE HCL 15 MG PO TABS: 15.0000 mg | ORAL_TABLET | Freq: Three times a day (TID) | ORAL | 0 refills | Status: DC | PRN

## 2022-04-18 NOTE — Telephone Encounter (Signed)
She can not get prescription sent out of state but we can send it to her regular pharmacy

## 2022-04-18 NOTE — Telephone Encounter (Signed)
Patient states she is out of Oxycodone 15 mg immediate tablets and wanted to know could she get a refill before the appointment 05/02/2022. Patient also wanted to know could she get the medication sent to another state because she is not in [place] this weekend, Patient will give [place] the pharmacy if it can be done. She would like a call back at [phone]

## 2022-04-18 NOTE — Telephone Encounter (Signed)
Patient is requesting a refill of the following medications: Requested Prescriptions   Pending Prescriptions Disp Refills   oxyCODONE (ROXICODONE) 15 MG immediate release tablet 90 tablet 0    Sig: Take 1 tablet (15 mg total) by mouth every 8 (eight) hours as needed for pain.    Date of patient request: 04/18/2022 Date of last refill: 03/21/2022 Last refill amount: 90 tablets   Patient is due for a updated contract at upcoming appt on 05/02/2022 11:00 am

## 2022-04-19 ENCOUNTER — Other Ambulatory Visit: Payer: Self-pay | Admitting: Family

## 2022-04-19 DIAGNOSIS — I10 Essential (primary) hypertension: Secondary | ICD-10-CM

## 2022-04-22 ENCOUNTER — Ambulatory Visit: Payer: Medicare HMO | Admitting: Family

## 2022-05-02 ENCOUNTER — Ambulatory Visit: Payer: Medicare HMO | Admitting: Family

## 2022-05-09 ENCOUNTER — Telehealth: Payer: Self-pay

## 2022-05-09 NOTE — Telephone Encounter (Signed)
Patient called and left voicemail on clinical intake line. She wants to know if she can have prescription for Prevegan called to the pharmacy for her dementia  Message routed to Richarda Blade, NP

## 2022-05-12 DIAGNOSIS — H5203 Hypermetropia, bilateral: Secondary | ICD-10-CM | POA: Diagnosis not present

## 2022-05-12 DIAGNOSIS — Z0100 Encounter for examination of eyes and vision without abnormal findings: Secondary | ICD-10-CM | POA: Diagnosis not present

## 2022-05-12 DIAGNOSIS — H0102B Squamous blepharitis left eye, upper and lower eyelids: Secondary | ICD-10-CM | POA: Diagnosis not present

## 2022-05-12 DIAGNOSIS — H01001 Unspecified blepharitis right upper eyelid: Secondary | ICD-10-CM | POA: Diagnosis not present

## 2022-05-12 DIAGNOSIS — H0102A Squamous blepharitis right eye, upper and lower eyelids: Secondary | ICD-10-CM | POA: Diagnosis not present

## 2022-05-12 DIAGNOSIS — H2513 Age-related nuclear cataract, bilateral: Secondary | ICD-10-CM | POA: Diagnosis not present

## 2022-05-12 NOTE — Telephone Encounter (Signed)
Prevagen is an over the counter supplement not FDA approved for prescription.recommend reading side effects prior to using medication.

## 2022-05-13 NOTE — Telephone Encounter (Signed)
Called and left message for patient with [name]'s response.

## 2022-05-26 ENCOUNTER — Telehealth: Payer: Self-pay

## 2022-05-26 DIAGNOSIS — G8929 Other chronic pain: Secondary | ICD-10-CM

## 2022-05-26 DIAGNOSIS — G894 Chronic pain syndrome: Secondary | ICD-10-CM

## 2022-05-26 DIAGNOSIS — M797 Fibromyalgia: Secondary | ICD-10-CM

## 2022-05-26 MED ORDER — OXYCODONE HCL 15 MG PO TABS: 15.0000 mg | ORAL_TABLET | Freq: Three times a day (TID) | ORAL | 0 refills | Status: DC | PRN

## 2022-05-26 NOTE — Telephone Encounter (Signed)
Patient called to request a refill on her ocycodone. Patient states she understands that she needs an appointment, however she has several barriers preventing her from scheduling, such as transportation (son doesn't have a car), not feeling well, and limited funds. Patient is relying on someone to pick up her rx's.  Patient would like to know if [name], [name] C, NP would refill Oxycodone and she will schedule a follow-up sometime in January 2024.   Patient is requesting a refill of the following medications: Requested Prescriptions   Pending Prescriptions Disp Refills   oxyCODONE (ROXICODONE) 15 MG immediate release tablet 90 tablet 0    Sig: Take 1 tablet (15 mg total) by mouth every 8 (eight) hours as needed for pain.    Date of last refill: 04/18/2022  Refill amount: 90  Treatment agreement date: outdated, last signed 03/11/2021, and no pending appointment to make a notation to update treatment agreement.   Please advise

## 2022-05-26 NOTE — Telephone Encounter (Signed)
Will refill medication but will need to make appointment prior to next refill to update contract.

## 2022-05-27 NOTE — Telephone Encounter (Signed)
Patient aware rx sent and scheduled appointment for 07/15/2022 at 2 pm

## 2022-05-29 ENCOUNTER — Ambulatory Visit: Payer: Self-pay

## 2022-05-29 NOTE — Patient Outreach (Signed)
  Care Coordination   05/29/2022 Name: Melinda [name] MRN: [id number] DOB: Nov 21, 1950   Care Coordination Outreach Attempts:  A second unsuccessful outreach was attempted today to offer the patient with information about available care coordination services as a benefit of their health plan.     Follow Up Plan:  Additional outreach attempts will be made to offer the patient care coordination information and services.   Encounter Outcome:  No Answer   Care Coordination Interventions:  No, not indicated    Bevelyn Ngo, BSW, CDP Social Worker, Certified Dementia Practitioner [facility]  Care Coordination [phone]

## 2022-06-05 ENCOUNTER — Telehealth: Payer: Self-pay | Admitting: *Deleted

## 2022-06-05 NOTE — Telephone Encounter (Signed)
Patient called and stated that she was going to see a Dr. In [place] and wanted her Medication list and Problem List emailed or texted to her.   Informed patient that we do not send patient information through email or text. Informed her that this information was accessible through MyChart, she stated that she does not want to do myChart.   Informed patient that she can sign a Medical Release and we could fax her information that way. She Agreed.

## 2022-06-08 ENCOUNTER — Other Ambulatory Visit: Payer: Self-pay | Admitting: Family

## 2022-06-08 DIAGNOSIS — I5032 Chronic diastolic (congestive) heart failure: Secondary | ICD-10-CM

## 2022-06-09 ENCOUNTER — Telehealth: Payer: Self-pay

## 2022-06-09 DIAGNOSIS — G43009 Migraine without aura, not intractable, without status migrainosus: Secondary | ICD-10-CM

## 2022-06-09 MED ORDER — SUMATRIPTAN SUCCINATE 25 MG PO TABS: ORAL_TABLET | ORAL | 1 refills | Status: DC

## 2022-06-09 NOTE — Telephone Encounter (Signed)
Pharmacy send a rx for sumatriptan 25 mg and medication was send into pharmacy.

## 2022-06-20 ENCOUNTER — Other Ambulatory Visit: Payer: Self-pay

## 2022-06-20 DIAGNOSIS — G8929 Other chronic pain: Secondary | ICD-10-CM

## 2022-06-20 DIAGNOSIS — M797 Fibromyalgia: Secondary | ICD-10-CM

## 2022-06-20 DIAGNOSIS — G894 Chronic pain syndrome: Secondary | ICD-10-CM

## 2022-06-24 MED ORDER — OXYCODONE HCL 15 MG PO TABS: 15.0000 mg | ORAL_TABLET | Freq: Three times a day (TID) | ORAL | 0 refills | Status: DC | PRN

## 2022-06-24 NOTE — Telephone Encounter (Signed)
Oxycodone script refilled.  

## 2022-07-15 ENCOUNTER — Encounter: Payer: Medicare (Managed Care) | Admitting: Family

## 2022-07-31 ENCOUNTER — Other Ambulatory Visit: Payer: Self-pay | Admitting: Family

## 2022-07-31 DIAGNOSIS — I5032 Chronic diastolic (congestive) heart failure: Secondary | ICD-10-CM

## 2022-08-20 ENCOUNTER — Other Ambulatory Visit: Payer: Self-pay | Admitting: Family

## 2022-08-20 ENCOUNTER — Other Ambulatory Visit: Payer: Self-pay | Admitting: Nurse Practitioner

## 2022-08-25 ENCOUNTER — Encounter: Payer: Self-pay | Admitting: Family

## 2022-08-25 ENCOUNTER — Telehealth (INDEPENDENT_AMBULATORY_CARE_PROVIDER_SITE_OTHER): Payer: Medicare PPO | Admitting: Family

## 2022-08-25 DIAGNOSIS — M797 Fibromyalgia: Secondary | ICD-10-CM

## 2022-08-25 DIAGNOSIS — G894 Chronic pain syndrome: Secondary | ICD-10-CM | POA: Diagnosis not present

## 2022-08-25 DIAGNOSIS — M5442 Lumbago with sciatica, left side: Secondary | ICD-10-CM | POA: Diagnosis not present

## 2022-08-25 DIAGNOSIS — M5441 Lumbago with sciatica, right side: Secondary | ICD-10-CM

## 2022-08-25 DIAGNOSIS — G8929 Other chronic pain: Secondary | ICD-10-CM

## 2022-08-25 MED ORDER — OXYCODONE HCL 15 MG PO TABS: 15.0000 mg | ORAL_TABLET | Freq: Three times a day (TID) | ORAL | 0 refills | Status: AC | PRN

## 2022-08-25 NOTE — Progress Notes (Signed)
This service is provided via telemedicine  No vital signs collected/recorded due to the encounter was a telemedicine visit.   Location of patient (ex: home, work):  Home  Patient consents to a telephone visit:    Location of the provider (ex: office, home):  Office  Name of any referring provider:  Aahil Fredin C, NP   Names of all persons participating in the telemedicine service and their role in the encounter:  [name] (patient); Porsha McClurkin,CMA; Karmina Zufall,NP  Time spent on call:  10    Provider: Axiel Fjeld FNP-C  Kanon Colunga, Nelda Bucks, NP  Patient Care Team: Ilda Laskin, Nelda Bucks, NP as PCP - General (Family Medicine)  Extended Emergency Contact Information Primary Emergency Contact: Glennie [place] Mobile Phone: [phone] Relation: Son Secondary Emergency Contact: Dutch,Lashawn Mobile Phone: [phone] Relation: Daughter  Code Status:  Full Code  Goals of care: Advanced Directive information    11/08/2021    8:45 AM  Advanced Directives  Does Patient Have a Medical Advance Directive? Yes  Type of Advance Directive [place]  Does patient want to make changes to medical advance directive? No - Patient declined  Copy of [place] in Chart? No - copy requested  Would patient like information on creating a medical advance directive? No - Patient declined     Chief Complaint  Patient presents with   Acute Visit    Patient presents today for medication review.    HPI:  Pt is a 72 y.o. female seen today for an acute visit for medication review.states has relocated to [place] with the son but has not been able to establish with another PCP provider states has appointment in April,2024.request her pain medication to be refilled [facility] to [facility].  I have discussed with patient that pain medication cannot be send to mailing pharmacy due to safety issues.will need to continue with  local pharmacy here in [place] since provider is not incensed out of [place].patient agreed to  x 1 time refill of medication here in  then follow up in [place].    Past Medical History:  Diagnosis Date   Acute kidney injury ([place])    Allergy    Anxiety    Arthritis    "99% of my body" (11/11/2016)   Asthma    Bursitis of both hips    Cataract    Cervical scoliosis    Chronic back pain    "all over my back" (11/11/2016)   Depressive disorder, not elsewhere classified    Enthesopathy of hip region    Environmental allergies    "I take Claritin qd; 365 days/year" (11/11/2016)   Essential hypertension, benign    Fibromyalgia    Frequent falls    GERD (gastroesophageal reflux disease)    Headache    "at least 1/wk; may last for 2 days or so" (11/11/2016)   History of blood transfusion 1985   w/hysterectomy   Lumbar stenosis    Migraine    "a few/month" (11/11/2016)   Muscle weakness (generalized)    Myalgia and myositis, unspecified    Osteoporosis    Other abnormal blood chemistry    Other malaise and fatigue    Raynaud's syndrome    Sciatic nerve pain, left    Sciatica    Unspecified vitamin D deficiency    Past Surgical History:  Procedure Laterality Date   [place]; 1979   IR  KYPHO LUMBAR INC FX REDUCE BONE BX UNI/BIL CANNULATION INC/IMAGING  11/04/2019   SHOULDER OPEN ROTATOR CUFF REPAIR Left     No Known Allergies  Outpatient Encounter Medications as of 08/25/2022  Medication Sig   acetaminophen (TYLENOL) 500 MG tablet Take 1,000 mg by mouth every evening.    amitriptyline (ELAVIL) 10 MG tablet TAKE 1 TABLET BY MOUTH DAILY AT NOON (12PM) ALONG WITH '150MG'$  TABLET TOTALING '160MG'$    amitriptyline (ELAVIL) 150 MG tablet TAKE 1 TABLET (150 MG TOTAL) BY MOUTH AT BEDTIME.   ascorbic acid (VITAMIN C) 500 MG tablet Take 1 tablet (500 mg total) by mouth 2 (two) times daily.   carboxymethylcellulose (REFRESH PLUS) 0.5 % SOLN Place 2 drops  into both eyes 2 (two) times daily as needed (dry eyes).    Cholecalciferol (VITAMIN D3) 50 MCG (2000 UT) capsule TAKE 1 CAPSULE (2,000 UNITS TOTAL) BY MOUTH DAILY.   citalopram (CELEXA) 20 MG tablet TAKE 1 TABLET (20 MG TOTAL) BY MOUTH DAILY.   famotidine (PEPCID) 10 MG tablet TAKE 1 TABLET TWICE DAILY   fluticasone (FLONASE) 50 MCG/ACT nasal spray PLACE 2 SPRAYS INTO BOTH NOSTRILS DAILY AS NEEDED FOR ALLERGIES OR RHINITIS.   furosemide (LASIX) 40 MG tablet TAKE 1 TABLET (40 MG TOTAL) BY MOUTH DAILY.   Incontinence Supply Disposable (DEPEND ADJUSTABLE UNDERWEAR LG) MISC Use for Incontinence. Change four times daily. Size Large. Dx: R32   lidocaine (LIDODERM) 5 % Place 1 patch onto the skin daily. Remove & Discard patch within 12 hours or as directed by MD   loratadine (CLARITIN) 10 MG tablet TAKE 1 TABLET BY MOUTH EVERY DAY   Menthol-Zinc Oxide (MOISTURE BARRIER) 0.44-20.6 % OINT Apply to skin when changing Depends four times daily for moisture protection   metoprolol tartrate (LOPRESSOR) 50 MG tablet TAKE ONE TABLET BY MOUTH TWICE DAILY FOR BLOOD PRESSURE   naloxone (NARCAN) 2 MG/2ML injection Inject 1 mL (1 mg total) into the skin as needed (overdose).   oxyCODONE (ROXICODONE) 15 MG immediate release tablet Take 1 tablet (15 mg total) by mouth every 8 (eight) hours as needed for pain.   potassium chloride (KLOR-CON M) 10 MEQ tablet TAKE 1 TABLET EVERY DAY   SUMAtriptan (IMITREX) 25 MG tablet May repeat in 2 hours if headache persists or recurs.   vitamin B-12 (CYANOCOBALAMIN) 500 MCG tablet TAKE 1 TABLET EVERY DAY   [DISCONTINUED] potassium chloride (KLOR-CON) 10 MEQ tablet Take 1 tablet (10 mEq total) by mouth daily.   No facility-administered encounter medications on file as of 08/25/2022.    Review of Systems  Constitutional:  Negative for appetite change, chills, fatigue, fever and unexpected weight change.  Eyes:  Negative for pain, discharge, redness, itching and visual disturbance.   Respiratory:  Negative for cough, chest tightness, shortness of breath and wheezing.   Cardiovascular:  Negative for chest pain, palpitations and leg swelling.  Gastrointestinal:  Negative for abdominal distention, abdominal pain, blood in stool, constipation, diarrhea, nausea and vomiting.  Musculoskeletal:  Positive for arthralgias, back pain and gait problem. Negative for joint swelling, myalgias, neck pain and neck stiffness.  Skin:  Negative for color change, pallor and rash.  Neurological:  Negative for dizziness, syncope, speech difficulty, weakness, light-headedness, numbness and headaches.    Immunization History  Administered Date(s) Administered   Fluad Quad(high Dose 65+) 03/29/2019   Influenza,inj,Quad PF,6+ Mos 05/23/2015, 05/28/2016, 03/24/2018   Influenza-Unspecified 07/07/2012, 02/28/2014   PFIZER(Purple Top)SARS-COV-2 Vaccination 10/03/2019   Pneumococcal Conjugate-13 07/14/2014   Pneumococcal Polysaccharide-23  09/11/2017, 11/03/2019   Td 01/26/2016   Tdap 01/26/2016   Pertinent  Health Maintenance Due  Topic Date Due   COLONOSCOPY (Pts 45-29yr Insurance coverage will need to be confirmed)  Never done   MAMMOGRAM  09/04/2019   DEXA SCAN  Completed   INFLUENZA VACCINE  Discontinued      03/08/2021    5:13 PM 03/11/2021   10:15 AM 05/07/2021    2:15 PM 11/08/2021    8:45 AM 08/25/2022    1:17 PM  Fall Risk  Falls in the past year? 0 0 0 1 1  Was there an injury with Fall? 0 0 0 0 0  Fall Risk Category Calculator 0 0 0 2 1  Fall Risk Category (Retired) Low Low Low Moderate   (RETIRED) Patient Fall Risk Level Low fall risk Low fall risk Low fall risk Moderate fall risk   Patient at Risk for Falls Due to History of fall(s) History of fall(s) No Fall Risks History of fall(s) History of fall(s)  Fall risk Follow up Falls evaluation completed Falls evaluation completed Falls evaluation completed Falls evaluation completed;Education provided;Falls prevention discussed  Falls evaluation completed   Functional Status Survey:    There were no vitals filed for this visit. There is no height or weight on file to calculate BMI. Physical Exam Constitutional:      General: She is not in acute distress.    Appearance: She is not ill-appearing.  Pulmonary:     Effort: Pulmonary effort is normal. No respiratory distress.  Neurological:     Mental Status: She is alert and oriented to person, place, and time.     Gait: Gait abnormal.  Psychiatric:        Mood and Affect: Mood normal.        Behavior: Behavior normal.     Labs reviewed: Recent Labs    11/08/21 1025  NA 138  K 4.7  CL 102  CO2 27  GLUCOSE 85  BUN 19  CREATININE 1.23*  CALCIUM 9.7   Recent Labs    11/08/21 1025  AST 11  ALT 7  BILITOT 0.9  PROT 7.1   Recent Labs    11/08/21 1025  WBC 4.1  NEUTROABS 1,702  HGB 11.0*  HCT 33.4*  MCV 84.6  PLT 311   Lab Results  Component Value Date   TSH 1.81 11/08/2021   Lab Results  Component Value Date   HGBA1C 5.3 03/29/2019   Lab Results  Component Value Date   CHOL 182 11/08/2021   HDL 88 11/08/2021   LDLCALC 81 11/08/2021   TRIG 52 11/08/2021   CHOLHDL 2.1 11/08/2021    Significant Diagnostic Results in last 30 days:  No results found.  Assessment/Plan 1. Fibromyalgia Chronic  - will refilled x 1 month of oxycodone then will need [place] oxyCODONE (ROXICODONE) 15 MG immediate release tablet; Take 1 tablet (15 mg total) by mouth every 8 (eight) hours as needed for pain.  Dispense: 90 tablet; Refill: 0  2. Chronic pain syndrome Continue on current pain regimen  Refill oxycodone x 1 month then patient to follow up [place] new [place]CP [place]   - oxyCODONE (ROXICODONE) 15 MG immediate release tablet; Take 1 tablet (15 mg total) by mouth every 8 (eight) hours as needed for pain.  Dispense: 90 tablet; Refill: 0  3. Chronic bilateral low back pain with bilateral sciatica Unable to  evaluate UDT over video  PDMR reviewed up to date but unable to review out of states. - will refilled x 1 month of oxycodone then will need to follow up with PCP in [place]  - oxyCODONE (ROXICODONE) 15 MG immediate release tablet; Take 1 tablet (15 mg total) by mouth every 8 (eight) hours as needed for pain.  Dispense: 90 tablet; Refill: 0  Family/ staff Communication: Reviewed plan of care with patient verbalized understanding   Labs/tests ordered: None   Next Appointment: Patient transferring to [place] with another provider.   I connected with  Melinda [name] on 08/25/22 by a video enabled telemedicine application and verified that I am speaking with the correct person using two identifiers.   I discussed the limitations of evaluation and management by telemedicine. The patient expressed understanding and agreed to proceed.  Spent 11 minutes of face to face with patient on video >50% time spent counseling; reviewing medical record; PDMR,labs; and developing future plan of care.   Sandrea Hughs, NP

## 2022-08-25 NOTE — Patient Instructions (Addendum)
  Oxycodone has been refilled x 1 month.will no longer prescribe pain medication being out of [place]. Please follow up with new PCP in [place].

## 2022-08-29 ENCOUNTER — Other Ambulatory Visit: Payer: Self-pay | Admitting: Family

## 2022-08-29 DIAGNOSIS — E538 Deficiency of other specified B group vitamins: Secondary | ICD-10-CM

## 2022-10-22 ENCOUNTER — Other Ambulatory Visit: Payer: Self-pay | Admitting: Family

## 2022-10-22 DIAGNOSIS — I10 Essential (primary) hypertension: Secondary | ICD-10-CM

## 2022-10-29 ENCOUNTER — Other Ambulatory Visit: Payer: Self-pay | Admitting: Family

## 2022-10-29 DIAGNOSIS — E538 Deficiency of other specified B group vitamins: Secondary | ICD-10-CM

## 2022-10-29 DIAGNOSIS — F332 Major depressive disorder, recurrent severe without psychotic features: Secondary | ICD-10-CM

## 2022-10-29 NOTE — Telephone Encounter (Signed)
Medication refilled as requested.

## 2022-11-23 IMAGING — CT CT HEAD W/O CM
4 series · 17 of 47 positions shown, 19 images · non-contrast
Comparison: 05/18/2020

CLINICAL DATA: Recent fall with headaches and facial pain, initial
encounter

EXAM:
CT HEAD WITHOUT CONTRAST
CT CERVICAL SPINE WITHOUT CONTRAST
TECHNIQUE: Multidetector CT imaging of the head and cervical spine was
performed following the standard protocol without intravenous
contrast. Multiplanar CT image reconstructions of the cervical spine
were also generated.

[Series 3: head wo · axial · 0.39mm/px · z∈[-121,-1]mm · 7 of 32 slices shown, 9 images]
[im 4/32  brain]
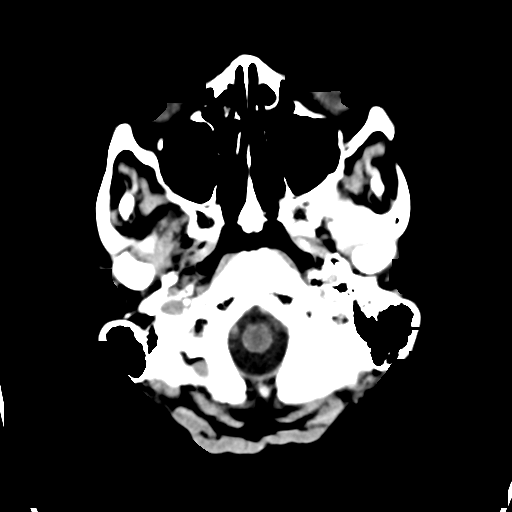
[im 4/32  bone]
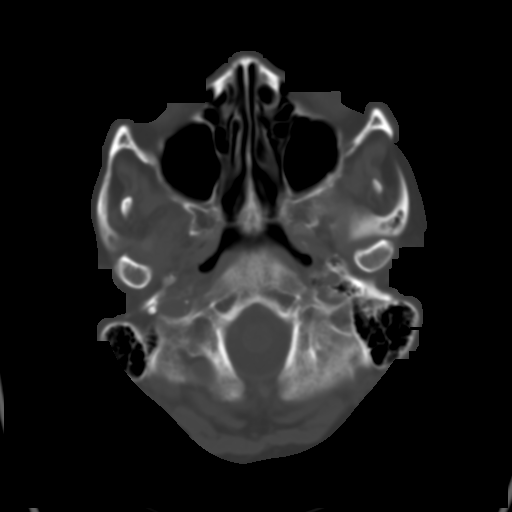
[im 8/32  brain]
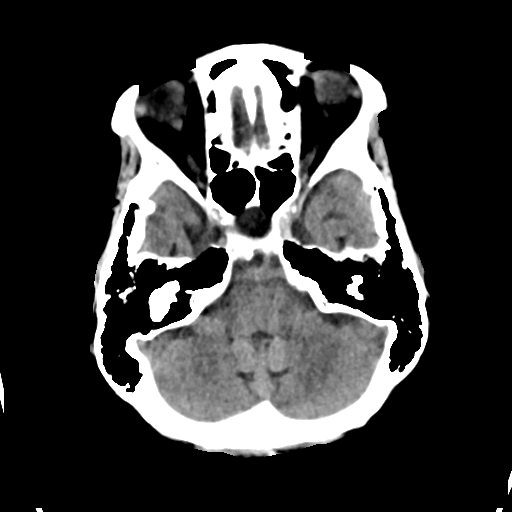
[im 12/32  brain]
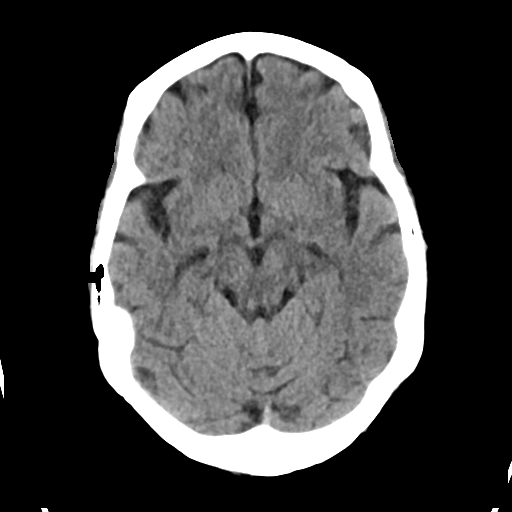
[im 16/32  brain]
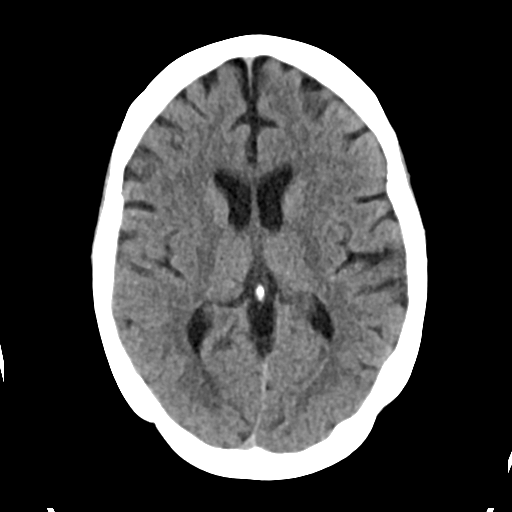
[im 20/32  brain]
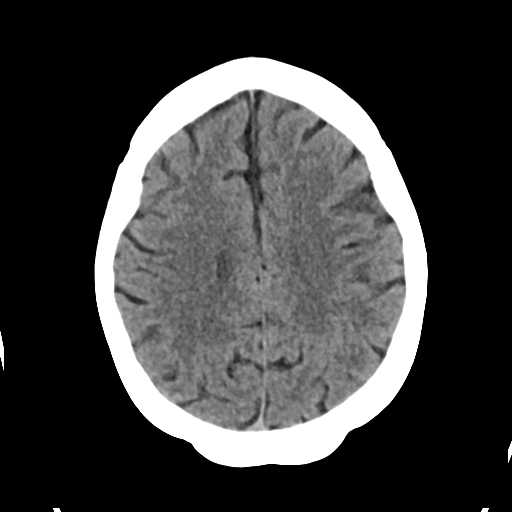
[im 20/32  bone]
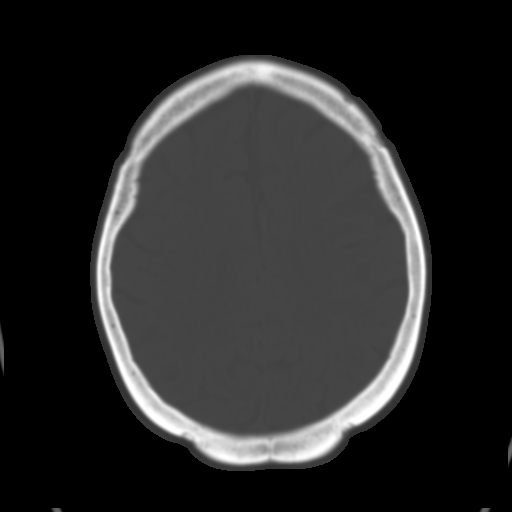
[im 24/32  brain]
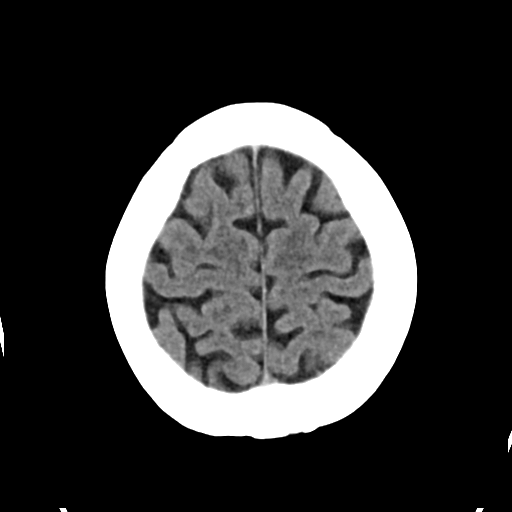
[im 28/32  brain]
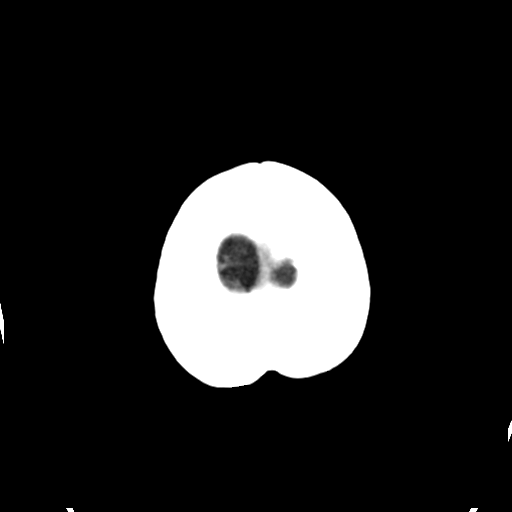

[Series 4: head bone · axial · 0.39mm/px · z∈[-122,-66]mm · 4 of 79 slices shown]
[im 8/79  bone]
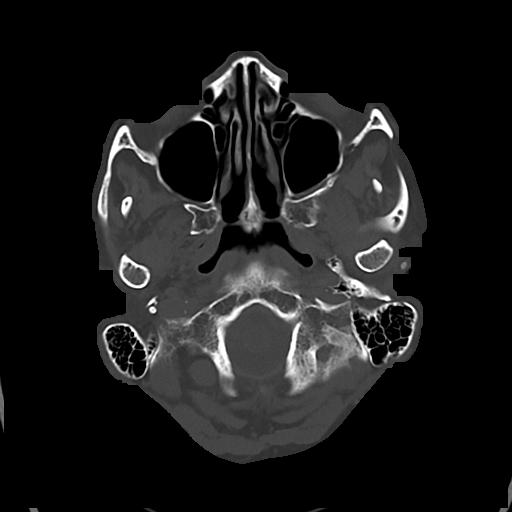
[im 16/79  bone]
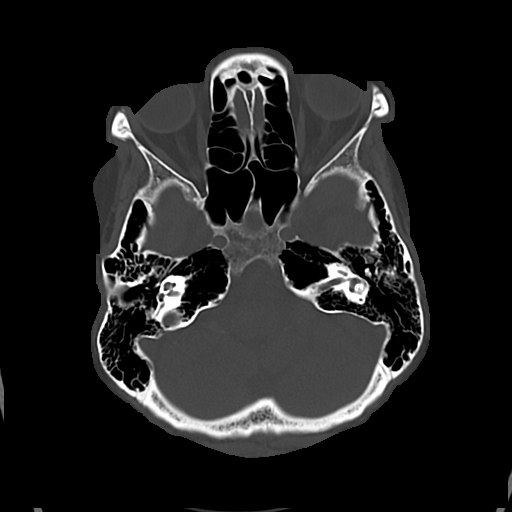
[im 24/79  bone]
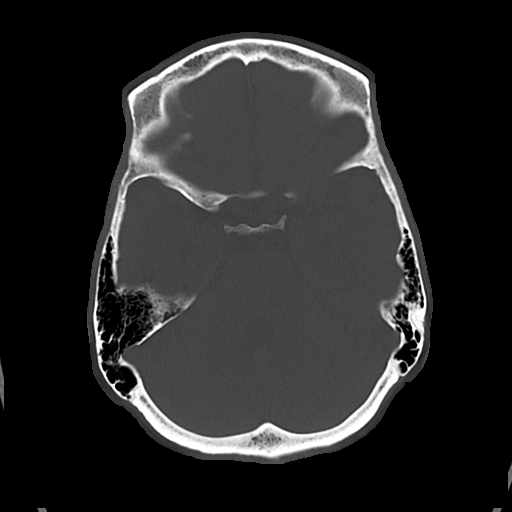
[im 36/79  bone]
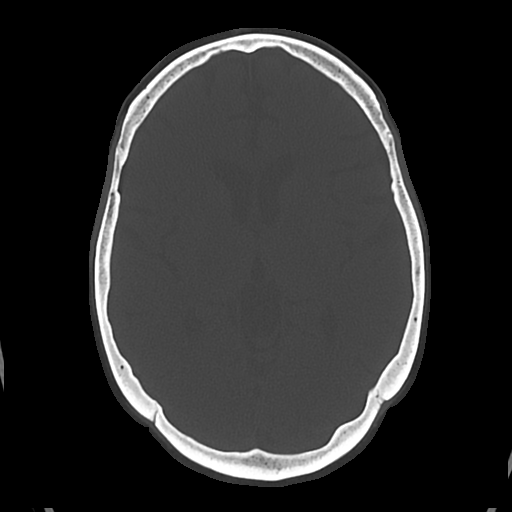

[Series 5: cor soft · coronal · 0.32mm/px · 3 of 59 slices shown]
[im 20/59  brain]
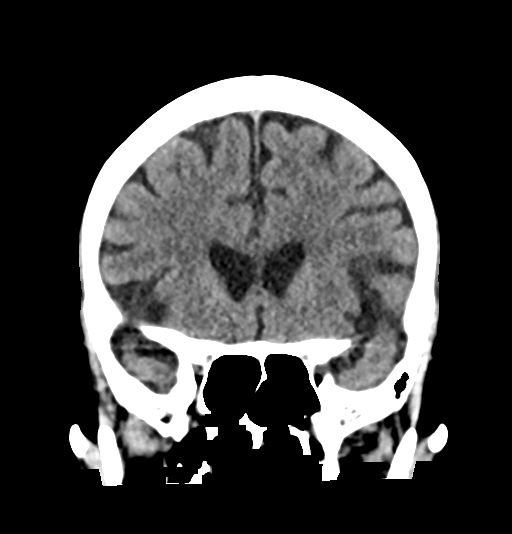
[im 26/59  brain]
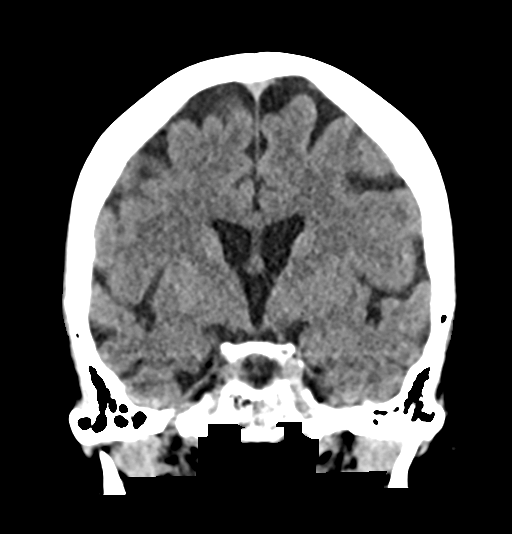
[im 33/59  brain]
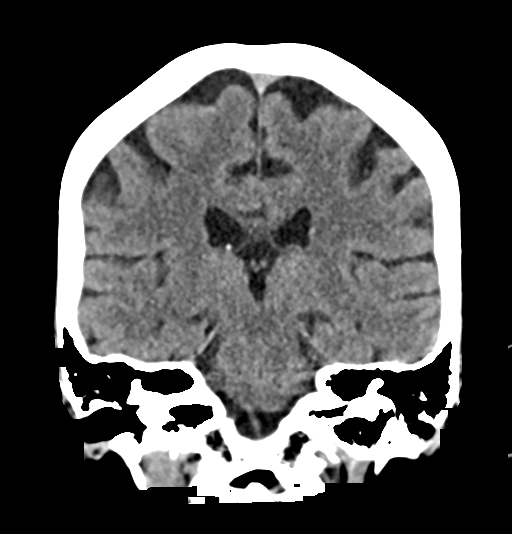

[Series 6: sag soft · sagittal · 0.34mm/px · 3 of 53 slices shown]
[im 18/53  brain]
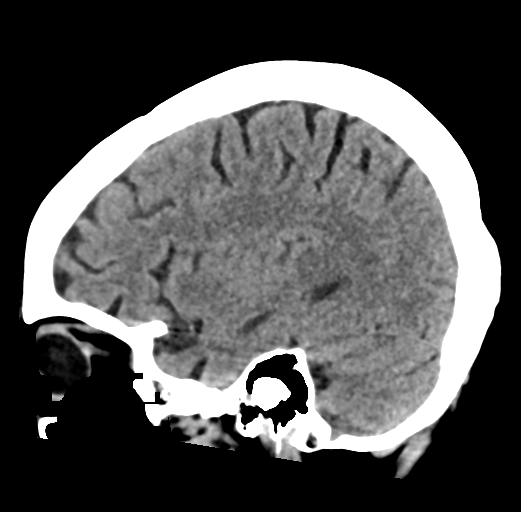
[im 27/53  brain]
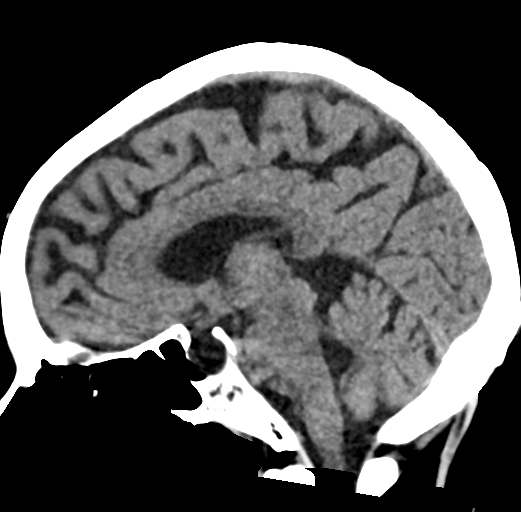
[im 35/53  brain]
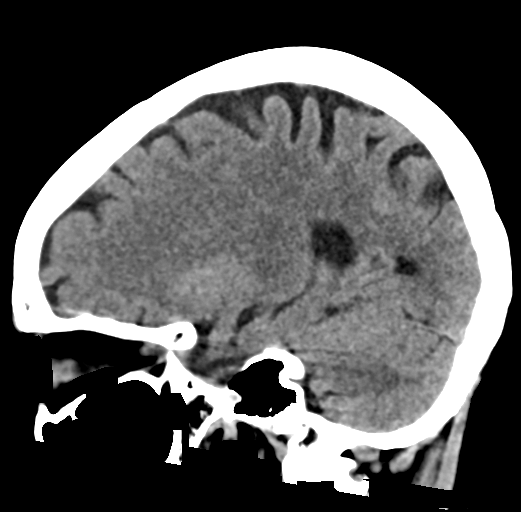

[17 of 47 positions shown; findings below may reference images not displayed]

FINDINGS: CT HEAD FINDINGS

Brain: No evidence of acute infarction, hemorrhage, hydrocephalus,
extra-axial collection or mass lesion/mass effect.

Vascular: No hyperdense vessel or unexpected calcification.

Skull: Normal. Negative for fracture or focal lesion.

Sinuses/Orbits: No acute finding.

Other: None.

CT CERVICAL SPINE FINDINGS

Alignment: Within normal limits.

Skull base and vertebrae: 7 cervical segments are well visualized.
Vertebral body height is well maintained. No acute fracture or acute
facet abnormality is noted.

Soft tissues and spinal canal: Surrounding soft tissue structures
show no acute abnormality.

Upper chest: Visualized lung apices are within normal limits.

Other: None
IMPRESSION: CT of the head: No acute intracranial abnormality noted.

CT of the cervical spine: No acute abnormality is seen.

## 2022-12-05 ENCOUNTER — Other Ambulatory Visit: Payer: Self-pay | Admitting: Family

## 2022-12-05 DIAGNOSIS — G43009 Migraine without aura, not intractable, without status migrainosus: Secondary | ICD-10-CM

## 2022-12-08 NOTE — Telephone Encounter (Signed)
Pharmacy requested refill.  ?Pended Rx and sent to [name] for approval.  ?

## 2023-01-11 ENCOUNTER — Other Ambulatory Visit: Payer: Self-pay | Admitting: Family

## 2023-04-02 ENCOUNTER — Telehealth: Payer: Medicare PPO

## 2023-04-02 NOTE — Telephone Encounter (Signed)
Call the patient on the phone to confirm [place]  Left message on voicemail for patient to return call when available

## 2023-06-13 ENCOUNTER — Other Ambulatory Visit: Payer: Self-pay | Admitting: Family

## 2023-06-13 DIAGNOSIS — I5032 Chronic diastolic (congestive) heart failure: Secondary | ICD-10-CM

## 2023-07-06 ENCOUNTER — Other Ambulatory Visit: Payer: Self-pay | Admitting: Family

## 2023-07-06 DIAGNOSIS — I5032 Chronic diastolic (congestive) heart failure: Secondary | ICD-10-CM

## 2023-08-27 ENCOUNTER — Other Ambulatory Visit: Payer: Self-pay | Admitting: Family

## 2023-08-27 NOTE — Telephone Encounter (Signed)
 Left message on voicemail for patient to return call when available . Reason for call: Need to confirm PCP status, patient not seen x 12 months and does not have a pending appointment    We need to schedule a follow-up if still under [name], Melinda C, NP care and refill will be approved for #90 with no additional refills until seen

## 2023-08-28 NOTE — Telephone Encounter (Signed)
 Spoke with patient, patient states she has moved up [place] and will find a new provider soon, however she needs [place] to provide a 90 day supply for requested medications to allow her time to get acclimated with her new living arrangements.   High risk or very high risk warning populated when attempting to refill medication. RX request sent to PCP for review and approval if warranted.

## 2024-08-04 ENCOUNTER — Other Ambulatory Visit: Payer: Self-pay | Admitting: Family

## 2024-08-04 DIAGNOSIS — I10 Essential (primary) hypertension: Secondary | ICD-10-CM
